# Patient Record
Sex: Male | Born: 1935
Health system: Southern US, Community
[De-identification: ages and names within clinical notes are randomized; demographics above are authoritative.]

## PROBLEM LIST (undated history)

## (undated) DIAGNOSIS — T7840XA Allergy, unspecified, initial encounter: Secondary | ICD-10-CM

## (undated) DIAGNOSIS — C61 Malignant neoplasm of prostate: Secondary | ICD-10-CM

## (undated) DIAGNOSIS — I10 Essential (primary) hypertension: Secondary | ICD-10-CM

## (undated) DIAGNOSIS — M1990 Unspecified osteoarthritis, unspecified site: Secondary | ICD-10-CM

## (undated) DIAGNOSIS — D649 Anemia, unspecified: Secondary | ICD-10-CM

## (undated) DIAGNOSIS — J189 Pneumonia, unspecified organism: Secondary | ICD-10-CM

## (undated) DIAGNOSIS — M659 Unspecified synovitis and tenosynovitis, unspecified site: Secondary | ICD-10-CM

## (undated) DIAGNOSIS — I509 Heart failure, unspecified: Secondary | ICD-10-CM

## (undated) DIAGNOSIS — Z9289 Personal history of other medical treatment: Secondary | ICD-10-CM

## (undated) DIAGNOSIS — I2510 Atherosclerotic heart disease of native coronary artery without angina pectoris: Secondary | ICD-10-CM

## (undated) DIAGNOSIS — G629 Polyneuropathy, unspecified: Secondary | ICD-10-CM

## (undated) DIAGNOSIS — I639 Cerebral infarction, unspecified: Secondary | ICD-10-CM

## (undated) DIAGNOSIS — K469 Unspecified abdominal hernia without obstruction or gangrene: Secondary | ICD-10-CM

## (undated) DIAGNOSIS — H269 Unspecified cataract: Secondary | ICD-10-CM

## (undated) DIAGNOSIS — I255 Ischemic cardiomyopathy: Secondary | ICD-10-CM

## (undated) DIAGNOSIS — Z87442 Personal history of urinary calculi: Secondary | ICD-10-CM

## (undated) DIAGNOSIS — L409 Psoriasis, unspecified: Secondary | ICD-10-CM

## (undated) DIAGNOSIS — F419 Anxiety disorder, unspecified: Secondary | ICD-10-CM

## (undated) DIAGNOSIS — I219 Acute myocardial infarction, unspecified: Secondary | ICD-10-CM

## (undated) DIAGNOSIS — Z8719 Personal history of other diseases of the digestive system: Secondary | ICD-10-CM

## (undated) DIAGNOSIS — G4730 Sleep apnea, unspecified: Secondary | ICD-10-CM

## (undated) DIAGNOSIS — D126 Benign neoplasm of colon, unspecified: Secondary | ICD-10-CM

## (undated) HISTORY — PX: WRIST SURGERY: SHX841

## (undated) HISTORY — DX: Synovitis and tenosynovitis, unspecified: M65.9

## (undated) HISTORY — DX: Personal history of other diseases of the digestive system: Z87.19

## (undated) HISTORY — DX: Unspecified osteoarthritis, unspecified site: M19.90

## (undated) HISTORY — DX: Benign neoplasm of colon, unspecified: D12.6

## (undated) HISTORY — DX: Malignant neoplasm of prostate: C61

## (undated) HISTORY — DX: Personal history of urinary calculi: Z87.442

## (undated) HISTORY — DX: Cerebral infarction, unspecified: I63.9

## (undated) HISTORY — PX: POLYPECTOMY: SHX149

## (undated) HISTORY — DX: Unspecified synovitis and tenosynovitis, unspecified site: M65.90

## (undated) HISTORY — PX: COLONOSCOPY: SHX174

## (undated) HISTORY — DX: Psoriasis, unspecified: L40.9

## (undated) HISTORY — DX: Unspecified abdominal hernia without obstruction or gangrene: K46.9

## (undated) HISTORY — PX: EYE SURGERY: SHX253

## (undated) HISTORY — DX: Allergy, unspecified, initial encounter: T78.40XA

## (undated) HISTORY — DX: Unspecified cataract: H26.9

## (undated) HISTORY — DX: Polyneuropathy, unspecified: G62.9

## (undated) HISTORY — DX: Acute myocardial infarction, unspecified: I21.9

---
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
## 1978-03-10 HISTORY — PX: BACK SURGERY: SHX140

## 1978-11-09 HISTORY — PX: COLON SURGERY: SHX602

## 1983-03-11 DIAGNOSIS — Z9289 Personal history of other medical treatment: Secondary | ICD-10-CM

## 1983-03-11 HISTORY — DX: Personal history of other medical treatment: Z92.89

## 1983-03-11 HISTORY — PX: COLOSTOMY: SHX63

## 1983-03-11 HISTORY — PX: APPENDECTOMY: SHX54

## 1983-11-09 HISTORY — PX: COLON SURGERY: SHX602

## 1984-03-10 HISTORY — PX: CHOLECYSTECTOMY: SHX55

## 1984-10-08 ENCOUNTER — Encounter: Payer: Self-pay | Admitting: Gastroenterology

## 1984-10-28 ENCOUNTER — Encounter: Payer: Self-pay | Admitting: Gastroenterology

## 1985-03-10 HISTORY — PX: OTHER SURGICAL HISTORY: SHX169

## 1993-08-02 ENCOUNTER — Encounter: Payer: Self-pay | Admitting: Gastroenterology

## 1993-08-29 ENCOUNTER — Encounter: Payer: Self-pay | Admitting: Gastroenterology

## 1996-11-29 ENCOUNTER — Encounter: Payer: Self-pay | Admitting: Gastroenterology

## 2000-08-17 ENCOUNTER — Encounter: Payer: Self-pay | Admitting: Gastroenterology

## 2000-09-02 ENCOUNTER — Encounter: Payer: Self-pay | Admitting: Gastroenterology

## 2001-03-10 HISTORY — PX: KNEE ARTHROSCOPY: SUR90

## 2003-11-28 ENCOUNTER — Ambulatory Visit (HOSPITAL_COMMUNITY): Admission: RE | Admit: 2003-11-28 | Discharge: 2003-11-28 | Payer: Self-pay | Admitting: Internal Medicine

## 2004-01-29 ENCOUNTER — Ambulatory Visit: Payer: Self-pay | Admitting: Internal Medicine

## 2004-02-13 ENCOUNTER — Ambulatory Visit (HOSPITAL_COMMUNITY): Admission: RE | Admit: 2004-02-13 | Discharge: 2004-02-13 | Payer: Self-pay | Admitting: Orthopedic Surgery

## 2004-07-04 ENCOUNTER — Ambulatory Visit: Payer: Self-pay | Admitting: Internal Medicine

## 2004-08-12 ENCOUNTER — Ambulatory Visit: Payer: Self-pay | Admitting: Internal Medicine

## 2004-11-21 ENCOUNTER — Ambulatory Visit: Payer: Self-pay | Admitting: Internal Medicine

## 2004-11-26 ENCOUNTER — Ambulatory Visit: Payer: Self-pay | Admitting: Internal Medicine

## 2005-01-09 ENCOUNTER — Ambulatory Visit: Payer: Self-pay | Admitting: Internal Medicine

## 2005-05-26 ENCOUNTER — Ambulatory Visit: Payer: Self-pay | Admitting: Internal Medicine

## 2005-08-01 ENCOUNTER — Ambulatory Visit: Payer: Self-pay | Admitting: Internal Medicine

## 2005-08-14 ENCOUNTER — Ambulatory Visit: Payer: Self-pay | Admitting: Gastroenterology

## 2005-08-14 ENCOUNTER — Ambulatory Visit: Payer: Self-pay | Admitting: Internal Medicine

## 2005-08-26 ENCOUNTER — Ambulatory Visit: Payer: Self-pay | Admitting: Gastroenterology

## 2005-08-26 ENCOUNTER — Encounter (INDEPENDENT_AMBULATORY_CARE_PROVIDER_SITE_OTHER): Payer: Self-pay | Admitting: *Deleted

## 2006-01-09 ENCOUNTER — Ambulatory Visit: Payer: Self-pay | Admitting: Internal Medicine

## 2006-04-16 ENCOUNTER — Ambulatory Visit: Payer: Self-pay | Admitting: Internal Medicine

## 2006-11-27 ENCOUNTER — Encounter: Payer: Self-pay | Admitting: *Deleted

## 2006-11-27 DIAGNOSIS — J020 Streptococcal pharyngitis: Secondary | ICD-10-CM | POA: Insufficient documentation

## 2006-11-27 DIAGNOSIS — M659 Unspecified synovitis and tenosynovitis, unspecified site: Secondary | ICD-10-CM | POA: Insufficient documentation

## 2006-11-27 DIAGNOSIS — Z8719 Personal history of other diseases of the digestive system: Secondary | ICD-10-CM

## 2006-11-27 DIAGNOSIS — Z87442 Personal history of urinary calculi: Secondary | ICD-10-CM

## 2006-11-27 DIAGNOSIS — Z9189 Other specified personal risk factors, not elsewhere classified: Secondary | ICD-10-CM | POA: Insufficient documentation

## 2006-11-27 DIAGNOSIS — I10 Essential (primary) hypertension: Secondary | ICD-10-CM

## 2006-11-27 DIAGNOSIS — M1990 Unspecified osteoarthritis, unspecified site: Secondary | ICD-10-CM | POA: Insufficient documentation

## 2006-11-27 DIAGNOSIS — Z9889 Other specified postprocedural states: Secondary | ICD-10-CM | POA: Insufficient documentation

## 2006-11-27 DIAGNOSIS — Z9089 Acquired absence of other organs: Secondary | ICD-10-CM | POA: Insufficient documentation

## 2006-11-27 HISTORY — DX: Personal history of other diseases of the digestive system: Z87.19

## 2006-11-27 HISTORY — DX: Personal history of urinary calculi: Z87.442

## 2007-01-08 ENCOUNTER — Ambulatory Visit: Payer: Self-pay | Admitting: Internal Medicine

## 2007-03-15 ENCOUNTER — Ambulatory Visit: Payer: Self-pay | Admitting: Internal Medicine

## 2007-03-15 LAB — CONVERTED CEMR LAB
BUN: 20 mg/dL (ref 6–23)
CO2: 31 meq/L (ref 19–32)
Calcium: 10 mg/dL (ref 8.4–10.5)
Chloride: 101 meq/L (ref 96–112)
Creatinine, Ser: 1 mg/dL (ref 0.4–1.5)
Glucose, Bld: 88 mg/dL (ref 70–99)

## 2007-03-16 ENCOUNTER — Encounter: Payer: Self-pay | Admitting: Internal Medicine

## 2007-07-02 ENCOUNTER — Encounter: Payer: Self-pay | Admitting: Internal Medicine

## 2007-07-29 ENCOUNTER — Encounter: Payer: Self-pay | Admitting: Internal Medicine

## 2007-09-01 ENCOUNTER — Ambulatory Visit: Admission: RE | Admit: 2007-09-01 | Discharge: 2007-11-30 | Payer: Self-pay | Admitting: Radiation Oncology

## 2007-09-02 ENCOUNTER — Encounter: Payer: Self-pay | Admitting: Internal Medicine

## 2007-10-15 ENCOUNTER — Ambulatory Visit: Payer: Self-pay | Admitting: Internal Medicine

## 2007-10-15 DIAGNOSIS — K432 Incisional hernia without obstruction or gangrene: Secondary | ICD-10-CM | POA: Insufficient documentation

## 2007-10-15 DIAGNOSIS — L408 Other psoriasis: Secondary | ICD-10-CM | POA: Insufficient documentation

## 2007-10-16 LAB — CONVERTED CEMR LAB
Basophils Absolute: 0 10*3/uL (ref 0.0–0.1)
Calcium: 9.9 mg/dL (ref 8.4–10.5)
Creatinine, Ser: 0.9 mg/dL (ref 0.4–1.5)
Eosinophils Relative: 2.3 % (ref 0.0–5.0)
GFR calc Af Amer: 107 mL/min
GFR calc non Af Amer: 88 mL/min
Glucose, Bld: 86 mg/dL (ref 70–99)
HCT: 44.5 % (ref 39.0–52.0)
MCHC: 35 g/dL (ref 30.0–36.0)
Monocytes Absolute: 0.7 10*3/uL (ref 0.1–1.0)
Monocytes Relative: 8.5 % (ref 3.0–12.0)
Potassium: 3.8 meq/L (ref 3.5–5.1)
Sodium: 141 meq/L (ref 135–145)

## 2007-10-18 ENCOUNTER — Ambulatory Visit: Payer: Self-pay | Admitting: Cardiology

## 2007-10-18 DIAGNOSIS — G609 Hereditary and idiopathic neuropathy, unspecified: Secondary | ICD-10-CM | POA: Insufficient documentation

## 2007-10-19 ENCOUNTER — Encounter (INDEPENDENT_AMBULATORY_CARE_PROVIDER_SITE_OTHER): Payer: Self-pay | Admitting: *Deleted

## 2007-10-26 ENCOUNTER — Encounter: Admission: RE | Admit: 2007-10-26 | Discharge: 2007-10-26 | Payer: Self-pay | Admitting: Urology

## 2007-11-12 ENCOUNTER — Encounter: Payer: Self-pay | Admitting: Internal Medicine

## 2007-11-26 ENCOUNTER — Ambulatory Visit (HOSPITAL_BASED_OUTPATIENT_CLINIC_OR_DEPARTMENT_OTHER): Admission: RE | Admit: 2007-11-26 | Discharge: 2007-11-26 | Payer: Self-pay | Admitting: Urology

## 2007-12-23 ENCOUNTER — Ambulatory Visit: Admission: RE | Admit: 2007-12-23 | Discharge: 2008-02-28 | Payer: Self-pay | Admitting: Radiation Oncology

## 2008-03-22 ENCOUNTER — Encounter: Payer: Self-pay | Admitting: Internal Medicine

## 2008-10-04 ENCOUNTER — Encounter: Payer: Self-pay | Admitting: Internal Medicine

## 2008-12-13 ENCOUNTER — Ambulatory Visit: Payer: Self-pay | Admitting: Internal Medicine

## 2008-12-14 ENCOUNTER — Ambulatory Visit: Payer: Self-pay | Admitting: Internal Medicine

## 2008-12-14 DIAGNOSIS — Z8546 Personal history of malignant neoplasm of prostate: Secondary | ICD-10-CM

## 2008-12-14 DIAGNOSIS — Z860100 Personal history of colon polyps, unspecified: Secondary | ICD-10-CM | POA: Insufficient documentation

## 2008-12-14 LAB — CONVERTED CEMR LAB
Calcium: 10.1 mg/dL (ref 8.4–10.5)
Chloride: 104 meq/L (ref 96–112)
Creatinine, Ser: 0.9 mg/dL (ref 0.4–1.5)
Potassium: 3.7 meq/L (ref 3.5–5.1)
Sodium: 136 meq/L (ref 135–145)
Total CHOL/HDL Ratio: 4
Triglycerides: 79 mg/dL (ref 0.0–149.0)

## 2008-12-19 ENCOUNTER — Ambulatory Visit: Payer: Self-pay | Admitting: Internal Medicine

## 2008-12-24 ENCOUNTER — Encounter: Payer: Self-pay | Admitting: Internal Medicine

## 2009-02-12 ENCOUNTER — Ambulatory Visit: Payer: Self-pay | Admitting: Internal Medicine

## 2009-02-12 LAB — CONVERTED CEMR LAB
Leukocytes, UA: NEGATIVE
Specific Gravity, Urine: 1.025 (ref 1.000–1.030)
Urobilinogen, UA: 0.2 (ref 0.0–1.0)
pH: 5.5 (ref 5.0–8.0)

## 2009-02-13 ENCOUNTER — Telehealth (INDEPENDENT_AMBULATORY_CARE_PROVIDER_SITE_OTHER): Payer: Self-pay | Admitting: *Deleted

## 2009-03-12 ENCOUNTER — Encounter: Payer: Self-pay | Admitting: Internal Medicine

## 2009-03-19 ENCOUNTER — Telehealth: Payer: Self-pay | Admitting: Gastroenterology

## 2009-03-21 ENCOUNTER — Ambulatory Visit: Payer: Self-pay | Admitting: Gastroenterology

## 2009-03-21 ENCOUNTER — Encounter (INDEPENDENT_AMBULATORY_CARE_PROVIDER_SITE_OTHER): Payer: Self-pay | Admitting: *Deleted

## 2009-03-21 DIAGNOSIS — R197 Diarrhea, unspecified: Secondary | ICD-10-CM

## 2009-03-21 DIAGNOSIS — K921 Melena: Secondary | ICD-10-CM | POA: Insufficient documentation

## 2009-04-04 ENCOUNTER — Ambulatory Visit: Payer: Self-pay | Admitting: Gastroenterology

## 2009-04-04 ENCOUNTER — Ambulatory Visit (HOSPITAL_COMMUNITY): Admission: RE | Admit: 2009-04-04 | Discharge: 2009-04-04 | Payer: Self-pay | Admitting: Gastroenterology

## 2009-04-06 ENCOUNTER — Encounter: Payer: Self-pay | Admitting: Gastroenterology

## 2009-06-18 ENCOUNTER — Encounter: Payer: Self-pay | Admitting: Gastroenterology

## 2009-10-01 ENCOUNTER — Ambulatory Visit: Payer: Self-pay | Admitting: Internal Medicine

## 2009-10-29 ENCOUNTER — Ambulatory Visit: Payer: Self-pay | Admitting: Internal Medicine

## 2009-11-02 ENCOUNTER — Encounter: Payer: Self-pay | Admitting: Internal Medicine

## 2009-11-02 LAB — CONVERTED CEMR LAB: Metaneph Total, Ur: 374 ug/24hr (ref 224–832)

## 2009-11-12 ENCOUNTER — Encounter: Payer: Self-pay | Admitting: Internal Medicine

## 2009-11-13 ENCOUNTER — Telehealth: Payer: Self-pay | Admitting: Internal Medicine

## 2009-11-22 ENCOUNTER — Ambulatory Visit: Payer: Self-pay | Admitting: Pulmonary Disease

## 2009-12-27 ENCOUNTER — Ambulatory Visit: Payer: Self-pay | Admitting: Internal Medicine

## 2009-12-27 ENCOUNTER — Encounter: Payer: Self-pay | Admitting: Internal Medicine

## 2009-12-27 LAB — CONVERTED CEMR LAB
ALT: 11 units/L (ref 0–53)
AST: 13 units/L (ref 0–37)
Albumin: 3.5 g/dL (ref 3.5–5.2)
Basophils Absolute: 0 10*3/uL (ref 0.0–0.1)
Basophils Relative: 0.5 % (ref 0.0–3.0)
Calcium: 10 mg/dL (ref 8.4–10.5)
Chloride: 103 meq/L (ref 96–112)
Cholesterol: 131 mg/dL (ref 0–200)
Creatinine, Ser: 1 mg/dL (ref 0.4–1.5)
Eosinophils Relative: 2.2 % (ref 0.0–5.0)
Glucose, Bld: 86 mg/dL (ref 70–99)
HCT: 43.9 % (ref 39.0–52.0)
LDL Cholesterol: 83 mg/dL (ref 0–99)
MCV: 96.8 fL (ref 78.0–100.0)
Monocytes Absolute: 0.7 10*3/uL (ref 0.1–1.0)
Monocytes Relative: 8.4 % (ref 3.0–12.0)
Neutro Abs: 6.4 10*3/uL (ref 1.4–7.7)
Platelets: 243 10*3/uL (ref 150.0–400.0)
RBC: 4.54 M/uL (ref 4.22–5.81)
RDW: 15 % — ABNORMAL HIGH (ref 11.5–14.6)
Sodium: 142 meq/L (ref 135–145)
TSH: 2.44 microintl units/mL (ref 0.35–5.50)
Total Protein: 6.4 g/dL (ref 6.0–8.3)
Triglycerides: 72 mg/dL (ref 0.0–149.0)
VLDL: 14.4 mg/dL (ref 0.0–40.0)

## 2009-12-30 ENCOUNTER — Telehealth: Payer: Self-pay | Admitting: Internal Medicine

## 2010-01-02 ENCOUNTER — Ambulatory Visit (HOSPITAL_BASED_OUTPATIENT_CLINIC_OR_DEPARTMENT_OTHER): Admission: RE | Admit: 2010-01-02 | Discharge: 2010-01-02 | Payer: Self-pay | Admitting: Pulmonary Disease

## 2010-01-08 ENCOUNTER — Ambulatory Visit: Payer: Self-pay | Admitting: Pulmonary Disease

## 2010-01-09 ENCOUNTER — Telehealth: Payer: Self-pay | Admitting: Pulmonary Disease

## 2010-01-16 ENCOUNTER — Ambulatory Visit: Payer: Self-pay | Admitting: Pulmonary Disease

## 2010-01-23 ENCOUNTER — Telehealth: Payer: Self-pay | Admitting: Gastroenterology

## 2010-01-24 ENCOUNTER — Ambulatory Visit: Payer: Self-pay | Admitting: Gastroenterology

## 2010-01-24 DIAGNOSIS — R109 Unspecified abdominal pain: Secondary | ICD-10-CM

## 2010-01-24 DIAGNOSIS — R1084 Generalized abdominal pain: Secondary | ICD-10-CM

## 2010-01-24 DIAGNOSIS — R198 Other specified symptoms and signs involving the digestive system and abdomen: Secondary | ICD-10-CM

## 2010-01-25 ENCOUNTER — Telehealth: Payer: Self-pay | Admitting: Nurse Practitioner

## 2010-02-19 ENCOUNTER — Ambulatory Visit: Payer: Self-pay | Admitting: Gastroenterology

## 2010-02-19 DIAGNOSIS — K5900 Constipation, unspecified: Secondary | ICD-10-CM | POA: Insufficient documentation

## 2010-03-21 ENCOUNTER — Telehealth: Payer: Self-pay | Admitting: Internal Medicine

## 2010-03-27 ENCOUNTER — Encounter: Payer: Self-pay | Admitting: Gastroenterology

## 2010-04-09 NOTE — Procedures (Signed)
Summary: [employer]   Imported By: Sherian Rein 03/22/2009 10:59:16  _____________________________________________________________________  External Attachment:    Type:   Image     Comment:   External Document

## 2010-04-09 NOTE — Procedures (Signed)
Summary: Colonoscopy  Patient: [name] Note: All result statuses are Final unless otherwise noted.  Tests: (1) Colonoscopy (COL)   COL Colonoscopy           DONE     [facility]     [address] [place], [place]  [address]           COLONOSCOPY PROCEDURE REPORT           PATIENT:  Jose Ware, Jose Ware  MR#:  [id number]     BIRTHDATE:  [birth year], 74 yrs. old  GENDER:  male           ENDOSCOPIST:  Judie Petit T. Russella Dar, MD, [facility]           PROCEDURE DATE:  04/04/2009     PROCEDURE:  Colonoscopy with biopsy and snare polypectomy,  w/ inj     sclerosis of hemorrhoids, w/ control of bleeding     ASA CLASS:  Class II     INDICATIONS:  1) hematochezia  2) unexplained diarrhea           MEDICATIONS:   Fentanyl 100 mcg IV, Versed 6 mg IV           DESCRIPTION OF PROCEDURE:   After the risks benefits and     alternatives of the procedure were thoroughly explained, informed     consent was obtained.  Digital rectal exam was performed and     revealed no abnormalities.   The Pentax Colonoscope [id number]     endoscope was introduced through the anus and advanced to the     cecum, which was identified by both the appendix and ileocecal     valve, without limitations.  The quality of the prep was good,     using MoviPrep.  The instrument was then slowly withdrawn as the     colon was fully examined.     <<PROCEDUREIMAGES>>           FINDINGS:  A sessile polyp was found at the ileocecal valve. It     was 3 mm in size. The polyp was removed using cold biopsy forceps.     A sessile polyp was found in the sigmoid colon. It was 4 mm in     size. The polyp was removed using cold biopsy forceps.  A sessile     polyp was found in the rectum on retroflexed view. It was 6 mm in     size. Polyp was snared, then cauterized with monopolar cautery.     Retrieval was successful. Mild diverticulosis was found in the     sigmoid colon.  Radiation proctitis was identified. in the rectum.   It was focal, friable, red and scattered. This was ablated with     APC with good results.  Internal hemorrhoids were found. They were     moderately sized. Injection sclerosis of hemorrhoids performed     with 2 cc of 23.4% saline injected above the dentate line. This     was otherwise a normal examination of the colon. Retroflexed views     in the rectum revealed no other findings other than those already     described.  The time to cecum =  3  minutes. The scope was then     withdrawn (time =  19  min) from the patient and the procedure     completed.           COMPLICATIONS:  None           ENDOSCOPIC IMPRESSION:     1) 3 mm sessile polyp at the ileocecal valve     2) 4 mm sessile polyp in the sigmoid colon     3) 6 mm sessile polyp     4) Mild diverticulosis in the sigmoid colon     5) Radiation proctitis     6) Internal hemorrhoids           RECOMMENDATIONS:     1) No aspirin or NSAID's for 2 weeks     2) Repeat Colonoscopy in 5 years.           Venita Lick. Russella Dar, MD, Clementeen Graham           CC: Su Grand, MD, Jacques Navy, MD           n.     Rosalie DoctorVenita Lick. Fajr Fife at 04/04/2009 09:28 AM           Burnetta Sabin, [id number]  Note: An exclamation mark (!) indicates a result that was not dispersed into the flowsheet. Document Creation Date: 04/04/2009 9:29 AM _______________________________________________________________________  (1) Order result status: Final Collection or observation date-time: 04/04/2009 09:17 Requested date-time:  Receipt date-time:  Reported date-time:  Referring Physician:   Ordering Physician: Claudette Head [phone]) Specimen Source:  Source: Launa Grill Order Number: [id number] Lab site:

## 2010-04-09 NOTE — Progress Notes (Signed)
Summary: RESULTS  Phone Note Call from Patient Call back at Home Phone [phone]   Summary of Call: Patient is requesting results of labs. Letter was mailed - wil call patient with results.  Initial call taken by: Lamar Sprinkles, CMA,  November 13, 2009 3:13 PM  Follow-up for Phone Call        Pt recieved letter. He wants to know what to do now. I spoke w/pt's wife who is a retired [employer] fundraiser. She has confirmed w/manual BP reading that the pt's machine is accurate.  9/4 182/96 160/86 135/74 9/5 180/99 180/94 9/6 150/82 160/96 169/94 9/7 158/87 154/84  Pt's wife had info on sleep apnea and pt feels he may have some symptoms. Pt c/o frequently being up and down at night, heavy snoring & daytime sleepyness. He has never had a sleep study.  Follow-up by: Lamar Sprinkles, CMA,  November 14, 2009 6:30 PM  Additional Follow-up for Phone Call Additional follow up Details #1::        1. change from hctz to furosemide 40mg  once a day 2. increase diltiazem to 240mg  once a day. 3. Will have [facility] schedule sleep study. Additional Follow-up by: Jacques Navy MD,  November 15, 2009 11:10 AM  New Problems: HYPERSOMNIA (ICD-780.54)   Additional Follow-up for Phone Call Additional follow up Details #2::    left mess to call office back.................Marland KitchenLamar Sprinkles, CMA  November 15, 2009 4:17 PM    Pt informed  Follow-up by: Lamar Sprinkles, CMA,  November 15, 2009 6:24 PM  New Problems: HYPERSOMNIA (ICD-780.54) New/Updated Medications: FUROSEMIDE 40 MG TABS (FUROSEMIDE) 1 by mouth once daily DILTIAZEM HCL COATED BEADS 240 MG XR24H-CAP (DILTIAZEM HCL COATED BEADS) 1 by mouth once daily Prescriptions: FUROSEMIDE 40 MG TABS (FUROSEMIDE) 1 by mouth once daily  #90 x 1   Entered by:   Lamar Sprinkles, CMA   Authorized by:   Jacques Navy MD   Signed by:   Lamar Sprinkles, CMA on 11/15/2009   Method used:   Electronically to        [facility]  [phone]* (retail)  [address]       [place], [place]  [address]       Ph: [phone] or [phone]       Fax: [phone]   RxID:   [id number] DILTIAZEM HCL COATED BEADS 240 MG XR24H-CAP (DILTIAZEM HCL COATED BEADS) 1 by mouth once daily  #90 x 1   Entered by:   Lamar Sprinkles, CMA   Authorized by:   Jacques Navy MD   Signed by:   Lamar Sprinkles, CMA on 11/15/2009   Method used:   Electronically to        [facility]  [phone]* (retail)       [address]       [place], [place]  [address]       Ph: [phone] or [phone]       Fax: [phone]   RxID:   [id number]

## 2010-04-09 NOTE — Assessment & Plan Note (Signed)
Summary: abdominal pain/change in bowel habits/sheri    History of Present Illness Visit Type: Follow-up Visit Primary GI MD: Elie Goody MD [facility] Primary Provider: Illene Regulus, MD Requesting Provider: n/a Chief Complaint: Patient c/o several days constant lower abdominal "soreness" and alternating constipation and diarrhea. There has been no blood in the stool. History of Present Illness:   Patient followed by Dr. Russella Dar for history of colon polyps and radiation proctitis. In 1985 he had sogmoid resection with colostomy for diverticulitis. Patient presents with new onset lower abdominal discomfort. Patient chronically alternates between constipation / loose stools, pattern has become more erratic lately. Two nights ago he had a low grade fever.   In Jan. 2011 patient had a colonoscopy for diarrhea and hematochezia. Dr. Russella Dar cauterized some areas of radiation proctitis at the time. Patient's current bowel pattern similliar to before though he isn't having any rectal bleeding.     GI Review of Systems    Reports abdominal pain, belching, loss of appetite, and  nausea.     Location of  Abdominal pain: lower abdomen.    Denies acid reflux, bloating, chest pain, dysphagia with liquids, dysphagia with solids, heartburn, vomiting, vomiting blood, weight loss, and  weight gain.      Reports change in bowel habits, constipation, diarrhea, and  diverticulosis.     Denies anal fissure, black tarry stools, fecal incontinence, heme positive stool, hemorrhoids, irritable bowel syndrome, jaundice, light color stool, liver problems, rectal bleeding, and  rectal pain.    Current Medications (verified): 1)  Furosemide 40 Mg Tabs (Furosemide) .Marland Kitchen.. 1 By Mouth Once Daily 2)  Diltiazem Hcl Coated Beads 240 Mg Xr24h-Cap (Diltiazem Hcl Coated Beads) .Marland Kitchen.. 1 By Mouth Once Daily 3)  Aspirin 325 Mg  Tabs (Aspirin) .... Take 1 Tablet By Mouth Once A Day 4)  Cyanocobalamin 1000 Mcg/ml Soln (Cyanocobalamin)  .Marland Kitchen.. 1000 Micrograms Intramuscularly Once A Month 5)  3 Ml Syringe 25 Guage 1/2 Needle .... Use With B-12 Monthy Injections 6)  Tylenol 325 Mg Tabs (Acetaminophen) .... Take As Needed  Allergies: 1)  ! Betadine  Past History:  Past Medical History: Reviewed history from 12/27/2009 and no changes required. Hx of ADENOCARCINOMA, PROSTATE (ICD-185) XRT and radiation seed implantation in 2010 PERIPHERAL NEUROPATHY (ICD-356.9) PSORIASIS (ICD-696.1) INCI HERNIA WITHOUT MENTION OBSTRUCTION/GANGRENE (ICD-553.21) DEGENERATIVE JOINT DISEASE (ICD-715.90) HYPERTENSION (ICD-401.9) Hx of STREPTOCOCCAL PHARYNGITIS (ICD-034.0) Hx of TENOSYNOVITIS (ICD-727.00) NEPHROLITHIASIS, HX OF (ICD-V13.01) DIVERTICULITIS, HX OF (ICD-V12.79) Adenomatous Colon Polyps 08/1993  Physician Roster:            Vaughan Sine Homero Fellers [place]            GU- Vernia Buff Nesi            GI - Claudette Head  Past Surgical History: * BACK SURGERY-minor disk surgery; instrumentation placed and removed in a second procedure CHOLECYSTECTOMY, HX OF (ICD-V45.79) '86 * REVERSAL OF COLOSTOMY '87 * SURGICAL REPAIR LEFT WRIST remote complicated with infection Hx of COLOSTOMY (ICD-V44.3) '85 after colectomy for diverticulitis ARTHROSCOPY, KNEE, HX OF (ICD-V45.89) left knee  '03 APPENDECTOMY, HX OF (ICD-V45.79)-remote '85 TONSILLECTOMY, HX OF (ICD-V45.79) as child Right knee surgery with cartilage removed '56 Bilateral Cataract Extractions  Family History: father- died 70, cirrhosis- alcohol mother - died 11, CAD, Liver trouble (?) sister - died 44, CHF, Breast cancer with arm involvement leading to amputation right arm. brother - deceased CAD/MI brother - died unknown cause Neg- colon, prostate cancer; DM and  glaucoma No FH of Colon Cancer: Family  History of Clotting disorder: 1/2 brother?  Social History: Reviewed history from 12/27/2009 and no changes required. [facility], [place].  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  The patient denies allergy/sinus, anemia, anxiety-new, back pain, blood in urine, breast changes/lumps, change in vision, confusion, cough, coughing up blood, depression-new, fainting, fever, headaches-new, hearing problems, heart murmur, heart rhythm changes, itching, menstrual pain, muscle pains/cramps, night sweats, nosebleeds, pregnancy symptoms, shortness of breath, skin rash, sore throat, swelling of feet/legs, swollen lymph glands, thirst - excessive , urination - excessive , urination changes/pain, urine leakage, vision changes, and voice change.    Vital Signs:  Patient profile:   76 year old male Height:      73 inches Weight:      228.13 pounds BMI:     30.21 BSA:     2.28 Pulse rate:   60 / minute Pulse rhythm:   regular BP sitting:   140 / 82  (left arm)  Vitals Entered By: Lamona Curl CMA Duncan Dull) (January 24, 2010 8:51 AM)  Physical Exam  General:  Well developed, well nourished, no acute distress. Head:  Normocephalic and atraumatic. Eyes:  Conjunctiva pink, no icterus.  Mouth:  No deformity or lesions, dentition normal. Neck:  no obvious masses  Lungs:  Clear throughout to auscultation. Heart:  RRR. Abdomen:  Abdomen soft, nondistended. He is significantly tender in LLQ. No peritoneal signs. No obvious masses or hepatomegaly.Normal bowel sounds.  Msk:  Symmetrical with no gross deformities. Normal posture. Extremities:  No palmar erythema, no edema.  Neurologic:  Alert and  oriented x4;  grossly normal neurologically. Skin:  Intact without significant  lesions or rashes. Cervical Nodes:  No significant cervical adenopathy. Psych:  Alert and cooperative. Normal mood and affect.   Impression & Recommendations:  Problem # 1:  ABDOMINAL PAIN, LOWER (ICD-789.09) Assessment New LLQ pain with low grade fever, worsening of his chronic alternating constipation / diarrhea. Could be infectious but suspect diverticulitis. Remote history of sigmoid resection for diverticulitis. Mild sigmoid diverticulosis on colonscopy Jan. 2011. Will treat with Flagyl and Cipro, Bentyl for pain (he doesn't want a narcotic). He knows to call us ASAP for worsening pain, fevers >100.9. Follow up with Dr. Russella Dar. See patient instructions.   Problem # 2:  CHANGE IN BOWELS (ICD-787.99) Chronic alternating constipation / diarrhea, worse over last few days and probably secondary to #1.   Patient Instructions: 1)  We sent prescriptions  for Cipro and Flagyl to  [facility]. 2)  Stay on a clear liquid diet for 24 to 48 hours then advance to a low residue low fiber diet. Once you are feeling better you can go to a regular diet. 3)  We sent a prescription for Bentyl to your pharmacy. 4)  We made you an appointment with Dr. Russella Dar to follow up on 02-19-10 at 10:15 AM. 5)  Copy sent to : Illene Regulus, MD 6)  The medication list was reviewed and reconciled.  All changed / newly prescribed medications were explained.  A complete medication list was provided to the patient / caregiver.  Prescriptions: BENTYL 10 MG CAPS (DICYCLOMINE HCL) Take 1 tab twice daily for cramping and spasms  #20 x 1   Entered by:   Lowry Ram NCMA   Authorized by:   Willette Cluster NP   Signed by:   Lowry Ram NCMA on 01/24/2010   Method used:   Electronically to        [facility]  [phone]* (retail)       [address]       [place], [place]  [address]       Ph: [phone] or [phone]       Fax: [phone]   RxID:   [id number] METRONIDAZOLE  500 MG TABS (METRONIDAZOLE) Take 1 tab three times a day x 10 days  #30 x 0   Entered by:   Lowry Ram NCMA   Authorized by:   Willette Cluster NP   Signed by:   Lowry Ram NCMA on 01/24/2010   Method used:   Electronically to        [facility]  [phone]* (retail)       [address]       [place], [place]  [address]       Ph: [phone] or [phone]       Fax: [phone]   RxID:   [id number] CIPRO 500 MG TABS (CIPROFLOXACIN HCL) Take 1 tab twice daily x 10 days  #20 x 0   Entered by:   Lowry Ram NCMA   Authorized by:   Willette Cluster NP   Signed by:   Lowry Ram NCMA on 01/24/2010   Method used:   Electronically to        [facility]  [phone]* (retail)       [address]       [place], [place]  [address]       Ph: [phone] or [phone]       Fax: [phone]   RxID:   [id number]

## 2010-04-09 NOTE — Letter (Signed)
Summary: [facility] Instructions  [facility]  [address] [place], [place] [address]   Phone: [phone]  Fax: [phone]       Jose Ware    16-Mar-1934    MRN: [id number]        Procedure Day Dorna Bloom: Wednesday January 26th, 2011     Arrival Time: 7:30am     Procedure Time: 8:30am     Location of Procedure:                    _ x_  [facility] (4th Floor)                        PREPARATION FOR COLONOSCOPY WITH MOVIPREP   Starting 5 days prior to your procedure 03/30/09  do not eat nuts, seeds, popcorn, corn, beans, peas,  salads, or any raw vegetables.  Do not take any fiber supplements (e.g. Metamucil, Citrucel, and Benefiber).  THE DAY BEFORE YOUR PROCEDURE         DATE:  04/03/09   DAY:  Tuesday   1.  Drink clear liquids the entire day-NO SOLID FOOD  2.  Do not drink anything colored red or purple.  Avoid juices with pulp.  No orange juice.  3.  Drink at least 64 oz. (8 glasses) of fluid/clear liquids during the day to prevent dehydration and help the prep work efficiently.  CLEAR LIQUIDS INCLUDE: Water Jello Ice Popsicles Tea (sugar ok, no milk/cream) Powdered fruit flavored drinks Coffee (sugar ok, no milk/cream) Gatorade Juice: apple, white grape, white cranberry  Lemonade Clear bullion, consomm, broth Carbonated beverages (any kind) Strained chicken noodle soup Hard Candy                             4.  In the morning, mix first dose of MoviPrep solution:    Empty 1 Pouch A and 1 Pouch B into the disposable container    Add lukewarm drinking water to the top line of the container. Mix to dissolve    Refrigerate (mixed solution should be used within 24 hrs)  5.  Begin drinking the prep at 5:00 p.m. The MoviPrep container is divided by 4 marks.   Every 15 minutes drink the solution down to the next mark (approximately 8 oz) until the full liter is complete.   6.  Follow completed prep with 16 oz of clear liquid of  your choice (Nothing red or purple).  Continue to drink clear liquids until bedtime.  7.  Before going to bed, mix second dose of MoviPrep solution:    Empty 1 Pouch A and 1 Pouch B into the disposable container    Add lukewarm drinking water to the top line of the container. Mix to dissolve    Refrigerate  THE DAY OF YOUR PROCEDURE      DATE:   04/04/09 DAY:  Wednesday  Beginning at  3:30 a.m. (5 hours before procedure):         1. Every 15 minutes, drink the solution down to the next mark (approx 8 oz) until the full liter is complete.  2. Follow completed prep with 16 oz. of clear liquid of your choice.    3. You may drink clear liquids until  4:30am  (4 HOURS BEFORE PROCEDURE).   MEDICATION INSTRUCTIONS  Unless otherwise instructed, you should take regular prescription medications with a small sip of  water   as early as possible the morning of your procedure.           OTHER INSTRUCTIONS  You will need a responsible adult at least 74 years of age to accompany you and drive you home.   This person must remain in the waiting room during your procedure.  Wear loose fitting clothing that is easily removed.  Leave jewelry and other valuables at home.  However, you may wish to bring a book to read or  an iPod/MP3 player to listen to music as you wait for your procedure to start.  Remove all body piercing jewelry and leave at home.  Total time from sign-in until discharge is approximately 2-3 hours.  You should go home directly after your procedure and rest.  You can resume normal activities the  day after your procedure.  The day of your procedure you should not:   Drive   Make legal decisions   Operate machinery   Drink alcohol   Return to work  You will receive specific instructions about eating, activities and medications before you leave.    The above instructions have been reviewed and explained to me by   Marchelle Folks.     I fully understand and can  verbalize these instructions _____________________________ Date _________  Appended Document: Moviprep Instructions Changed location to [facility] outpatient registration.

## 2010-04-09 NOTE — Letter (Signed)
Summary: [facility]  [facility]   Imported By: Sherian Rein 03/22/2009 10:52:12  _____________________________________________________________________  External Attachment:    Type:   Image     Comment:   External Document

## 2010-04-09 NOTE — Assessment & Plan Note (Signed)
Summary: CPX-LB   Vital Signs:  Patient profile:   76 year old male Height:      73 inches Weight:      226 pounds BMI:     29.92 O2 Sat:      96 % on Room air Temp:     97.3 degrees F oral Pulse rate:   50 / minute BP sitting:   142 / 80  (left arm) Cuff size:   regular  Vitals Entered By: Bill Salinas CMA (December 27, 2009 9:34 AM)  O2 Flow:  Room air  Primary Care Provider:  Illene Regulus, MD    History of Present Illness: Patient presents for a wellness exam. He is generally doing well.  In the interval he has had cataract surgery with IOL in both eyes. He is doing  better with distant vision but needs corrective lenses for presbyopia.   He is scheduled for a sleep study next week to evaluate for OSA.  He remains 100% independent in his ADLs, He is balancing his own check book, paying the bills, follows the debates and keeping intellectually engaged.   Preventive Screening-Counseling & Management  Alcohol-Tobacco     Alcohol drinks/day: <1     Alcohol type: wine     >5/day in last 3 mos: yes     Smoking Status: quit     Year Quit: 1983  Caffeine-Diet-Exercise     Caffeine use/day: 1 cup of coffee daily     Diet Comments: heart healthy diet - low fat     Does Patient Exercise: yes     Type of exercise: weights     Exercise (avg: min/session): 30-60     Times/week: 4     MSH Depression Score: no depression  Hep-HIV-STD-Contraception     Hepatitis Risk: no risk noted     HIV Risk: no risk noted     STD Risk: no risk noted     Dental Visit-last 6 months yes     Sun Exposure-Excessive: no  Safety-Violence-Falls     Seat Belt Use: yes     Helmet Use: n/a     Firearms in the Home: no firearms in the home     Smoke Detectors: yes     Violence in the Home: no risk noted     Sexual Abuse: no     Fall Risk: Low fall risk      Sexual History:  currently monogamous.        Drug Use:  never.        Blood Transfusions:  yes and prior to 1987.    Current  Medications (verified): 1)  Furosemide 40 Mg Tabs (Furosemide) .Marland Kitchen.. 1 By Mouth Once Daily 2)  Diltiazem Hcl Coated Beads 240 Mg Xr24h-Cap (Diltiazem Hcl Coated Beads) .Marland Kitchen.. 1 By Mouth Once Daily 3)  Aspirin 325 Mg  Tabs (Aspirin) .... Take 1 Tablet By Mouth Once A Day  Allergies (verified): 1)  ! Betadine  Past History:  Past Medical History: Hx of ADENOCARCINOMA, PROSTATE (ICD-185) XRT and radiation seed implantation in 2010 PERIPHERAL NEUROPATHY (ICD-356.9) PSORIASIS (ICD-696.1) INCI HERNIA WITHOUT MENTION OBSTRUCTION/GANGRENE (ICD-553.21) DEGENERATIVE JOINT DISEASE (ICD-715.90) HYPERTENSION (ICD-401.9) Hx of STREPTOCOCCAL PHARYNGITIS (ICD-034.0) Hx of TENOSYNOVITIS (ICD-727.00) NEPHROLITHIASIS, HX OF (ICD-V13.01) DIVERTICULITIS, HX OF (ICD-V12.79) Adenomatous Colon Polyps 08/1993  Physician Roster:            Vaughan Sine Homero Fellers [place]            GU- Su Grand  GI - Claudette Head  Past Surgical History: Reviewed history from 03/15/2007 and no changes required. * BACK SURGERY-minor disk surgery; instrumentation placed and removed in a second procedure CHOLECYSTECTOMY, HX OF (ICD-V45.79) '86 * REVERSAL OF COLOSTOMY '87 * SURGICAL REPAIR LEFT WRIST remote complicated with infection Hx of COLOSTOMY (ICD-V44.3) '85 after colectomy for diverticulitis ARTHROSCOPY, KNEE, HX OF (ICD-V45.89) left knee  '03 APPENDECTOMY, HX OF (ICD-V45.79)-remote '85 TONSILLECTOMY, HX OF (ICD-V45.79) as child Right knee surgery with cartilage removed '56  Family History: father- died 58, cirrhosis- alcohol mother - died 29, CAD, Liver trouble (?) sister - died 9, CHF, Breast cancer with arm involvement leading to amputation right arm. brother - deceased CAD/MI brother - died unknown cause Neg- colon, prostate cancer; DM and  glaucoma  Social History: [employer], [place].  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; remarried '03 3 sons-'63,  '65, '67 grandchildren 30; 1 great-grand daughter Patient is a former smoker: quit 25 yrs ago  Alcohol Use - no Daily Caffeine Use: 3-4 cups of coffee daily  Illicit Drug Use - no Caffeine use/day:  1 cup of coffee daily Dental Care w/in 6 mos.:  yes Sun Exposure-Excessive:  no Seat Belt Use:  yes Fall Risk:  Low fall risk Hepatitis Risk:  no risk noted HIV Risk:  no risk noted STD Risk:  no risk noted Sexual History:  currently monogamous Drug Use:  never Blood Transfusions:  yes, prior to 1987  Review of Systems  The patient denies anorexia, fever, weight loss, weight gain, vision loss, decreased hearing, hoarseness, chest pain, syncope, dyspnea on exertion, peripheral edema, prolonged cough, headaches, hemoptysis, abdominal pain, severe indigestion/heartburn, hematuria, incontinence, suspicious skin lesions, transient blindness, difficulty walking, depression, abnormal bleeding, and enlarged lymph nodes.         right wrist pain - has loss of mobility  Physical Exam  General:  WNWD white male in no distress Head:  Normocephalic and atraumatic without obvious abnormalities. No apparent alopecia or balding. Eyes:  vision grossly intact, pupils equal, and pupils round. IOLs noticed. Funid normal Ears:  R ear normal and L ear normal.   Nose:  no external deformity and no external erythema.   Mouth:  Oral mucosa and oropharynx without lesions or exudates.  Teeth in good repair: partial upper and native teethmandible,. No oral lesions Neck:  supple, full ROM, no masses, no thyromegaly, and no carotid bruits.   Chest Wall:  No deformities, masses, tenderness or gynecomastia noted. Lungs:  Normal respiratory effort, chest expands symmetrically. Lungs are clear to auscultation, no crackles or wheezes. Heart:  Normal rate and regular rhythm. S1 and S2 normal without gallop, murmur, click, rub or other extra sounds. Abdomen:  soft, non-tender, normal bowel sounds, no guarding, no  rigidity, and no hepatomegaly.  Multiple surgical scars RUQ and Ventral Prostate:  deferred to GU Msk:  normal ROM, no joint tenderness, no joint swelling, no joint warmth, no joint deformities, and no joint instability.   Pulses:  2+ radial and PT pulses Extremities:  No clubbing, cyanosis, edema, or deformity noted with normal full range of motion of all joints.   Neurologic:  alert & oriented X3, cranial nerves II-XII intact, sensation intact to light touch, gait normal, and DTRs symmetrical and normal.  alert & oriented X3, cranial nerves II-XII intact, sensation intact to light touch, gait normal, and DTRs symmetrical and normal.   Skin:  turgor normal, color normal, no suspicious lesions, and no  ulcerations.  turgor normal, color normal, no suspicious lesions, and no ulcerations.   Cervical Nodes:  no anterior cervical adenopathy and no posterior cervical adenopathy.  no anterior cervical adenopathy and no posterior cervical adenopathy.   Psych:  Oriented X3.  Oriented X3.     Impression & Recommendations:  Problem # 1:  HYPERSOMNIA (ICD-780.54) Continues to be a problem but no adverse effects on ADLs.  Plan sleep study as planned with recommendations to follow.  Problem # 2:  PERIPHERAL NEUROPATHY (ICD-356.9) Mild peripheral neuropathy without progression.  Plan - r/o metabolic cause, e.g. B1 level. Orders: TLB-B12 + Folate Pnl ([id number]-B12/FOL)  addendum - B12 is low at 128.  Plan - offer B12 replacement  Problem # 3:  DEGENERATIVE JOINT DISEASE (ICD-715.90) stable with no interference with his chose activities. He is taking no prescription medications.  His updated medication list for this problem includes:    Aspirin 325 Mg Tabs (Aspirin) .Marland Kitchen... Take 1 tablet by mouth once a day  Problem # 4:  HYPERTENSION (ICD-401.9)  His updated medication list for this problem includes:    Furosemide 40 Mg Tabs (Furosemide) .Marland Kitchen... 1 by mouth once daily    Diltiazem Hcl Coated  Beads 240 Mg Xr24h-cap (Diltiazem hcl coated beads) .Marland Kitchen... 1 by mouth once daily  BP today: 142/80 Prior BP: 138/74 (11/22/2009)  Mildly elevated BP today with previous borderline control.  Plan - report back with home readings. If SBP in high 130s will need to adjust medications.  Problem # 5:  Preventive Health Care (ICD-V70.0) Recent history as noted and currently doing well. Physical exam witout acute abnormal findings. Labs are excellent with LDL 83, chemistries normal. Last colonoscoy Jan '11. Immunizations: tetnus - Jan '05, Pneumonia vaccine Dec '10, Influenza today. No record of shingles vaccine - he will check for insurance coveage.   As noted he is independent in ALDs and fully functional re: cognitive ability. He has no sign or symptoms of depression.  In summary - a very nice gentleman who is medically stable at this time. He will return for B12 treatment at his convenience. He will have a brief return office visit in three months to monitor BP.  orders: TLB-Lipid Panel (80061-LIPID) TLB-Hepatic/Liver Function Pnl (80076-HEPATIC) TLB-TSH (Thyroid Stimulating Hormone) (84443-TSH) TLB-CBC Platelet - w/Differential (85025-CBCD) TLB-BMP (Basic Metabolic Panel-BMET) (80048-METABOL)  Complete Medication List: 1)  Furosemide 40 Mg Tabs (Furosemide) .Marland Kitchen.. 1 by mouth once daily 2)  Diltiazem Hcl Coated Beads 240 Mg Xr24h-cap (Diltiazem hcl coated beads) .Marland Kitchen.. 1 by mouth once daily 3)  Aspirin 325 Mg Tabs (Aspirin) .... Take 1 tablet by mouth once a day  Other Orders: Flu Vaccine 31yrs + MEDICARE PATIENTS (Z6109) Administration Flu vaccine - MCR (U0454) Medicare -1st Annual Wellness Visit [phone])   tientBurnetta Sabin Note: All result statuses are Final unless otherwise noted.  Tests: (1) B12 + Folate Panel (B12/FOL)   Vitamin B12          [L]  128 pg/mL                   211-911   Folate                    10.2 ng/mL     Deficient  0.4 - 3.4 ng/mL     Indeterminate  3.4 -  5.4 ng/mL     Normal  >5.4 ng/mL  Tests: (2) Lipid Panel (LIPID)   Cholesterol  131 mg/dL                   5-621     ATP III Classification            Desirable:  < 200 mg/dL                    Borderline High:  200 - 239 mg/dL               High:  > = 240 mg/dL   Triglycerides             72.0 mg/dL                  3.0-865.7     Normal:  <150 mg/dL     Borderline High:  846 - 199 mg/dL   HDL                  [L]  96.29 mg/dL                 >52.84   VLDL Cholesterol          14.4 mg/dL                  1.3-24.4   LDL Cholesterol           83 mg/dL                    0-10  CHO/HDL Ratio:  CHD Risk                             4                    Men          Women     1/2 Average Risk     3.4          3.3     Average Risk          5.0          4.4     2X Average Risk          9.6          7.1     3X Average Risk          15.0          11.0                           Tests: (3) Hepatic/Liver Function Panel (HEPATIC)   Total Bilirubin           0.9 mg/dL                   2.7-2.5   Direct Bilirubin          0.1 mg/dL                   3.6-6.4   Alkaline Phosphatase      96 U/L                      39-117   AST                       13 U/L  0-37   ALT                       11 U/L                      0-53   Total Protein             6.4 g/dL                    9.1-4.7   Albumin                   3.5 g/dL                    8.2-9.5  Tests: (4) TSH (TSH)   FastTSH                   2.44 uIU/mL                 0.35-5.50  Tests: (5) CBC Platelet w/Diff (CBCD)   White Cell Count          8.3 K/uL                    4.5-10.5   Red Cell Count            4.54 Mil/uL                 4.22-5.81   Hemoglobin                15.2 g/dL                   62.1-30.8   Hematocrit                43.9 %                      39.0-52.0   MCV                       96.8 fl                     78.0-100.0   MCHC                      34.5 g/dL                    65.7-84.6   RDW                  [H]  15.0 %                      11.5-14.6   Platelet Count            243.0 K/uL                  150.0-400.0   Neutrophil %              76.6 %                      43.0-77.0   Lymphocyte %              12.3 %                      12.0-46.0   Monocyte %  8.4 %                       3.0-12.0   Eosinophils%              2.2 %                       0.0-5.0   Basophils %               0.5 %                       0.0-3.0   Neutrophill Absolute      6.4 K/uL                    1.4-7.7   Lymphocyte Absolute       1.0 K/uL                    0.7-4.0   Monocyte Absolute         0.7 K/uL                    0.1-1.0  Eosinophils, Absolute                             0.2 K/uL                    0.0-0.7   Basophils Absolute        0.0 K/uL                    0.0-0.1  Tests: (6) BMP (METABOL)   Sodium                    142 mEq/L                   135-145   Potassium                 4.1 mEq/L                   3.5-5.1   Chloride                  103 mEq/L                   96-112   Carbon Dioxide            32 mEq/L                    19-32   Glucose                   86 mg/dL                    16-10   BUN                  [H]  24 mg/dL                    9-60   Creatinine                1.0 mg/dL                   4.5-4.0   Calcium  10.0 mg/dL                  1.6-10.9   GFR                       78.63 mL/min                >60  Orders Added: 1)  Flu Vaccine 78yrs + MEDICARE PATIENTS [Q2039] 2)  Administration Flu vaccine - MCR [G0008] 3)  Medicare -1st Annual Wellness Visit [G0438] 4)  Est. Patient Level III [60454] 5)  TLB-B12 + Folate Pnl [[id number]-B12/FOL] 6)  TLB-Lipid Panel [80061-LIPID] 7)  TLB-Hepatic/Liver Function Pnl [80076-HEPATIC] 8)  TLB-TSH (Thyroid Stimulating Hormone) [84443-TSH] 9)  TLB-CBC Platelet - w/Differential [85025-CBCD] 10)  TLB-BMP (Basic Metabolic Panel-BMET) [80048-METABOL]    Flu Vaccine  Consent Questions     Do you have a history of severe allergic reactions to this vaccine? no    Any prior history of allergic reactions to egg and/or gelatin? no    Do you have a sensitivity to the preservative Thimersol? no    Do you have a past history of Guillan-Barre Syndrome? no    Do you currently have an acute febrile illness? no    Have you ever had a severe reaction to latex? no    Vaccine information given and explained to patient? yes    Are you currently pregnant? no    Lot Number:[id number]   Exp Date:09/07/2010   Site Given  right Deltoid [id number]

## 2010-04-09 NOTE — Procedures (Signed)
Summary: Colonoscopy/GCDD  Colonoscopy/GCDD   Imported By: Sherian Rein 03/22/2009 10:57:07  _____________________________________________________________________  External Attachment:    Type:   Image     Comment:   External Document

## 2010-04-09 NOTE — Assessment & Plan Note (Signed)
Summary: follow up on rx-lb   Vital Signs:  Patient profile:   76 year old male Height:      73 inches Weight:      229.50 pounds BMI:     30.39 O2 Sat:      96 % on Room air Temp:     97.2 degrees F oral Pulse rate:   53 / minute Pulse rhythm:   regular BP sitting:   138 / 66  (left arm) Cuff size:   large  Vitals Entered By: Rock Nephew CMA (October 29, 2009 1:11 PM)  O2 Flow:  Room air                                                                                                                                                                                                                                                                                                                                                                                                        CC: Pt c/o early Am BP readings Is Patient Diabetic? No Pain Assessment Patient in pain? no      Nutritional Status BMI of > 30 = obese  Does patient need assistance? Functional Status Self care Ambulation Normal   Primary Care Provider:  Illene Regulus, MD   CC:  Pt c/o early Am BP readings.  History of Present Illness: Jose Ware is a 76 yo healthy-appearing Caucasian male with a history of prostate CA presenting for f/u on his HTN. Today his bp is 138/66 and pulse is 53. His wife has taken his blood pressure consistently  throughout the day for the last few weeks averaging a systolic bp of around 160, but has been using an older machine and small cuff to take it. He reports having morning headaches three to four times a week and states he can feel when his blood pressure is elevated because he gets a flushed hot sensation over his face that is not visible, a fuzzy sensation in his head, and a feeling of imbalance.  Allergies (verified): 1)  ! Betadine  Comments:  Nurse/Medical Assistant: Per pt, never got Avodart Rock Nephew CMA (October 29, 2009 1:12 PM) The patient's medications were  reviewed with the patient and were updated in the Medication List.  Past History:  Past Medical History: Last updated: March 24, 2009 Hx of ADENOCARCINOMA, PROSTATE (ICD-185) XRT and radiation seed implantation in 2010 PERIPHERAL NEUROPATHY (ICD-356.9) PSORIASIS (ICD-696.1) INCI HERNIA WITHOUT MENTION OBSTRUCTION/GANGRENE (ICD-553.21) DEGENERATIVE JOINT DISEASE (ICD-715.90) HYPERTENSION (ICD-401.9) Hx of STREPTOCOCCAL PHARYNGITIS (ICD-034.0) Hx of TENOSYNOVITIS (ICD-727.00) NEPHROLITHIASIS, HX OF (ICD-V13.01) DIVERTICULITIS, HX OF (ICD-V12.79) Adenomatous Colon Polyps 08/1993  Physician Roster:            Vaughan Sine Homero Fellers [place]            GU- Su Grand  Past Surgical History: Last updated: 03/15/2007 * BACK SURGERY-minor disk surgery; instrumentation placed and removed in a second procedure CHOLECYSTECTOMY, HX OF (ICD-V45.79) '86 * REVERSAL OF COLOSTOMY '87 * SURGICAL REPAIR LEFT WRIST remote complicated with infection Hx of COLOSTOMY (ICD-V44.3) '85 after colectomy for diverticulitis ARTHROSCOPY, KNEE, HX OF (ICD-V45.89) left knee  '03 APPENDECTOMY, HX OF (ICD-V45.79)-remote '85 TONSILLECTOMY, HX OF (ICD-V45.79) as child Right knee surgery with cartilage removed '56  Family History: Last updated: 03-24-09 father- died 62, cirrhosis- alcohol mother - died 50, CAD, Liver trouble (?) sister - died 50, CHF, Breast cancer with arm involvement leading to amputation right arm. Neg- colon, prostate cancer; DM and  glaucoma  Social History: Last updated: 03-24-2009 [facility], [place].  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  The patient denies fever, weight loss, weight gain, vision  loss, decreased hearing, chest pain, syncope, abdominal pain, melena, hematochezia, severe indigestion/heartburn, hematuria, incontinence, suspicious skin lesions, unusual weight change, and abnormal bleeding.         positive - frequency, urgency, nocturia X3, mild dyspnea when cutting the grass    Physical Exam  General:  alert and healthy-appearing.   Head:  normocephalic and atraumatic.   Eyes:  pupils equal, pupils round, and pupils reactive to light. no papilledema. Ears:  R ear normal, L ear normal, and no external deformities.   Nose:  no external deformity and no external erythema.   Mouth:  no gingival abnormalities and pharynx pink and moist.   Lungs:  normal respiratory effort, no accessory muscle use, no crackles, and no wheezes.   Heart:  regular rhythm, no murmur, and bradycardia.   Abdomen:  soft, non-tender, no distention, no masses, no hepatomegaly, and no splenomegaly.   Pulses:  R radial normal and L radial normal.   Extremities:  No edema. Neurologic:  alert & oriented X3 and strength normal in all extremities.   Skin:  turgor normal, color normal, and no  suspicious lesions.   Psych:  Oriented X3, normally interactive, good eye contact, not anxious appearing, and not depressed appearing.     Impression & Recommendations:  Problem # 1:  HYPERTENSION (ICD-401.9)  Assessment - Patient is experiencing episodes of elevated blood pressure accompanied by flushing and headache but without sense of adrenaline rush (fight or flight sensation). He could potentially be suffering from an adrenal adenoma tumor "pheochromocytoma."  Plan - Rule out pheochromocytoma with 24 hr urine collection to be certain there is not an excess of catecholamines.           Have patient bring his blood pressure measurement equipment to the office so it can be measured here to compare with our equipment's readings.   His updated medication list for this problem includes:    Hydrochlorothiazide  25 Mg Tabs (Hydrochlorothiazide) .Marland Kitchen... Take one tablet daily overdue for cpx last saw md 03/2007    Taztia Xt 180 Mg Xr24h-cap (Diltiazem hcl er beads) .Marland Kitchen... 1 by mouth once daily for blood pressure  Orders: T-Urine 24 Hr. Catecholamines [phone]) T-Urine 24 Hr. Metanephrines [phone])  BP today: 138/66 Prior BP: 156/90 (10/01/2009)  Labs Reviewed: K+: 3.7 (12/14/2008) Creat: : 0.9 (12/14/2008)   Chol: 137 (12/14/2008)   HDL: 30.50 (12/14/2008)   LDL: 91 (12/14/2008)   TG: 79.0 (12/14/2008)  Complete Medication List: 1)  Hydrochlorothiazide 25 Mg Tabs (Hydrochlorothiazide) .... Take one tablet daily overdue for cpx last saw md 03/2007 2)  Avodart 0.5 Mg Caps (Dutasteride) .... One tablet by mouth once daily 3)  Taztia Xt 180 Mg Xr24h-cap (Diltiazem hcl er beads) .Marland Kitchen.. 1 by mouth once daily for blood pressure

## 2010-04-09 NOTE — Letter (Signed)
Summary: Patient Notice- Polyp Results  [facility]  [address] [place], [place] [address]   Phone: [phone]  Fax: [phone]        April 06, 2009 MRN: [id number]    [facility] [address] [place], [place]  [address]    Dear Mr. [facility],  I am pleased to inform you that the colon polyp(s) removed during your recent colonoscopy was (were) found to be benign (no cancer detected) upon pathologic examination.  I recommend you have a repeat colonoscopy examination in 5 years to look for recurrent polyps, as having colon polyps increases your risk for having recurrent polyps or even colon cancer in the future.  Should you develop new or worsening symptoms of abdominal pain, bowel habit changes or bleeding from the rectum or bowels, please schedule an evaluation with either your primary care physician or with me.  Continue treatment plan as outlined the day of your exam.  Please call us if you are having persistent problems or have questions about your condition that have not been fully answered at this time.  Sincerely,  Meryl Dare MD [facility]  This letter has been electronically signed by your physician.  Appended Document: Patient Notice- Polyp Results letter mailed to patient's home

## 2010-04-09 NOTE — Assessment & Plan Note (Signed)
Summary: Gastroenterology     [facility]   OFFICE NOTE  Jose Ware, Jose Ware        OFFICE NO:  [id number]    DATE:  08-17-00      The patient is a very nice 76 year old white male with a history of adenomatous colon polyps and a segmental sigmoid colectomy for diverticulitis.  His last colonoscopy was in September of 1998 and revealed only mild diverticulosis with no recurrent polyps.  He has no abdominal complaints except for occasional diarrhea following certain foods, such as salads.  He specifically denies any rectal bleeding.  His interval history since 1998 has been unremarkable and all notes from Dr. Debby Bud since I last saw the patient were reviewed.    MEDICATIONS:  Aspirin 325 mg q day; Tylenol p.r.n.; Aspercreme p.r.n.  ALLERGIES:  Betadine.  PHYSICAL EXAMINATION:  No acute distress.  Weight 250.  Blood pressure 150/84.  Pulse 68.  HEENT exam:  Anicteric sclerae; oropharynx clear.  Chest clear to auscultation and percussion.  Cardiac regular rate and rhythm, without murmurs.  Abdomen is soft, nontender, nondistended, with normoactive bowel sounds.  No palpable organomegaly, masses or hernias.  Rectal examination deferred.  Extremities without clubbing, cyanosis or edema.  Neurologic:  Alert and oriented x 3.  ASSESSMENT AND PLAN:   1.   Personal history of adenomatous colon polyps.  Risks, benefits and alternatives to colonoscopy with possible polypectomy discussed with the patient and he consents to proceed.  This will be scheduled electively. 2.   Status post sigmoid colectomy for diverticulitis.      Aleck Locklin T. Pleas Koch., M.D., F.A.C.G.  [id number]  D: 08/17/00  T: 08/17/00  Job # [id number]

## 2010-04-09 NOTE — Procedures (Signed)
Summary: Colonoscopy and biopsy   Colonoscopy  Procedure date:  08/26/2005  Findings:      Results: Polyp.  Results: Hemorrhoids.     Results: Diverticulosis.       Location:  [facility].    Procedures Next Due Date:    Colonoscopy: 09/2010 Patient Name: Jose Ware, Jose Ware MRN:  Procedure Procedures: Colonoscopy CPT: 45409.    with polypectomy. CPT: A3573898.  Personnel: Endoscopist: Venita Lick. Russella Dar, MD, Clementeen Graham.  Exam Location: Exam performed in Outpatient Clinic. Outpatient  Patient Consent: Procedure, Alternatives, Risks and Benefits discussed, consent obtained, from patient. Consent was obtained by the RN.  Indications  Surveillance of: Adenomatous Polyp(s). Initial polypectomy was performed in 1995. in Jun. 3 or more Polyps were found at Index Exam. Largest polyp removed was 6 to 9 mm. Prior polyp located in both proximal and distal colon. Pathology of worst  polyp: tubular adenoma.  History  Current Medications: Patient is not currently taking Coumadin.  Pre-Exam Physical: Performed Aug 26, 2005. Cardio-pulmonary exam, Rectal exam, HEENT exam , Abdominal exam, Mental status exam WNL.  Comments: Pt. history reviewed/updated, physical exam performed prior to initiation of sedation?Yes Exam Exam: Extent of exam reached: Cecum, extent intended: Cecum.  The cecum was identified by appendiceal orifice and IC valve. Time for Withdrawl: 00:10:03. Colon retroflexion performed. ASA Classification: II. Tolerance: excellent.  Monitoring: Pulse and BP monitoring, Oximetry used. Supplemental O2 given.  Colon Prep Used HalfLytely for colon prep. Prep results: fair, adequate exam.  Sedation Meds: Patient assessed and found to be appropriate for moderate (conscious) sedation. Fentanyl 50 mcg. given IV. Versed 6 mg. given IV.  Findings NORMAL EXAM: Hepatic Flexure to Splenic Flexure.  MULTIPLE POLYPS: Ascending Colon. minimum size 4 mm, maximum size 4 mm.  Procedure:  snare without cautery, removed, Polyp retrieved, 2 polyps Polyps sent to pathology. ICD9: Colon Polyps: 211.3.  DIVERTICULOSIS: Descending Colon. Not bleeding. ICD9: Diverticulosis: 562.10. Comments: mild.  - PRIOR SURGERY: Sigmoid Colon. Segmental Colectomy. Anastamosis present.  NORMAL EXAM: Cecum.  HEMORRHOIDS: Internal. Size: Small. Not bleeding. Not thrombosed. ICD9: Hemorrhoids, Internal: 455.0.   Assessment  Diagnoses: 562.10: Diverticulosis.  211.3: Colon Polyps.  455.0: Hemorrhoids, Internal.   Events  Unplanned Interventions: No intervention was required.  Unplanned Events: There were no complications. Plans  Post Exam Instructions: Post sedation instructions given. No aspirin or non-steroidal containing medications: 2 weeks.  Medication Plan: Await pathology.  Patient Education: Patient given standard instructions for: Polyps. Diverticulosis. Hemorrhoids.  Disposition: After procedure patient sent to recovery. After recovery patient sent home.  Scheduling/Referral: Colonoscopy, to [facility] T. Russella Dar, MD, [facility], around Aug 27, 2010.    This report was created from the original endoscopy report, which was reviewed and signed by the above listed endoscopist.    cc: Laureen Abrahams, MD        SP Surgical Pathology - STATUS: Final             By: Gwenlyn Saran  ,        Perform Date: 769 015 7287 00:01  Ordered By: Rica Records Date: 19Jun07 15:02  Facility: [facility]                               Department: CPATH  Service Report Text  [facility]   [address] [address]   [place], [place] [address]   Telephone (  [phone] or [phone] Fax [phone]    REPORT OF SURGICAL PATHOLOGY    Case #: [id number]   Patient Name: Jose Ware.   Office Chart Number: [id number]    MRN: [id number]   Pathologist: Berneta Levins, MD   DOB/Age 05/27/1934 (Age: 76) Gender: M   Date Taken: 08/26/2005    Date Received: 08/26/2005    FINAL DIAGNOSIS    ***MICROSCOPIC EXAMINATION AND DIAGNOSIS***    COLON, POLYP(S): TWO ADENOMATOUS POLYP(S). NO HIGH GRADE   DYSPLASIA OR INVASIVE MALIGNANCY IDENTIFIED. (BIOPSIES,   ASCENDING)    COMMENT   There is adenoma with nuclear stratification and decreased mucin.   High grade dysplasia or invasive malignancy are not identified.   The findings are consistent with an adenomatous polyp(s) if the   biopsy(ies) is (are) representative of the entire lesion(s).   (JAS:gt) 08/27/05    gdt   Date Reported: 08/27/2005 Berneta Levins, MD   *** Electronically Signed Out By JAS ***    Clinical information   Follow-up colon polyps. Rule out adenoma (jy)    specimen(s) obtained   Colon, polyp(s), ascendng    Gross Description   Received in formalin are tan, soft tissue fragments that are   submitted in toto. Number: 2   Size: 0.5 and 0.8 cm one block (TB:jes,   08/27/05)    jes/

## 2010-04-09 NOTE — Assessment & Plan Note (Signed)
Summary: rectal bleeding/lk   History of Present Illness Visit Type: consult  Primary GI MD: Elie Goody MD [facility] Primary Provider: Illene Regulus, MD  Requesting Provider: Danae Chen, MD Chief Complaint: BRB in stool after BMs, and diarrhea  History of Present Illness:   This is a 76 year old male with a history of prostate cancer, status post external beam radiation treatment, and then radiation seed implantation. He has had intermittent rectal bleeding and diarrhea for approximately 3 weeks. He had several episodes with a moderate amount of blood in his stool and has had other episodes with scant amounts of blood in his stool. The diarrhea, and bleeding, has generally improved over the past 2 weeks. Underwent colonoscopy for polyp surveillance June 2007 showing diverticulosis internal hemorrhoids and small colon polyps.  He has also had intermittent hematuria and difficulty voiding.   GI Review of Systems      Denies abdominal pain, acid reflux, belching, bloating, chest pain, dysphagia with liquids, dysphagia with solids, heartburn, loss of appetite, nausea, vomiting, vomiting blood, weight loss, and  weight gain.      Reports diarrhea and  rectal bleeding.     Denies anal fissure, black tarry stools, change in bowel habit, constipation, diverticulosis, fecal incontinence, heme positive stool, hemorrhoids, irritable bowel syndrome, jaundice, light color stool, liver problems, and  rectal pain.   Current Medications (verified): 1)  Hydrochlorothiazide 25 Mg  Tabs (Hydrochlorothiazide) .... Take One Tablet Daily Overdue For Cpx Last Saw Md 03/2007 2)  Avodart 0.5 Mg Caps (Dutasteride) .... One Tablet By Mouth Once Daily  Allergies (verified): 1)  ! Betadine  Past History:  Past Medical History: Hx of ADENOCARCINOMA, PROSTATE (ICD-185) XRT and radiation seed implantation in 2010 PERIPHERAL NEUROPATHY (ICD-356.9) PSORIASIS (ICD-696.1) INCI HERNIA WITHOUT MENTION  OBSTRUCTION/GANGRENE (ICD-553.21) DEGENERATIVE JOINT DISEASE (ICD-715.90) HYPERTENSION (ICD-401.9) Hx of STREPTOCOCCAL PHARYNGITIS (ICD-034.0) Hx of TENOSYNOVITIS (ICD-727.00) NEPHROLITHIASIS, HX OF (ICD-V13.01) DIVERTICULITIS, HX OF (ICD-V12.79) Adenomatous Colon Polyps 08/1993  Physician Roster:            Vaughan Sine Homero Fellers [place]            GU- Su Grand  Past Surgical History: Reviewed history from 03/15/2007 and no changes required. * BACK SURGERY-minor disk surgery; instrumentation placed and removed in a second procedure CHOLECYSTECTOMY, HX OF (ICD-V45.79) '86 * REVERSAL OF COLOSTOMY '87 * SURGICAL REPAIR LEFT WRIST remote complicated with infection Hx of COLOSTOMY (ICD-V44.3) '85 after colectomy for diverticulitis ARTHROSCOPY, KNEE, HX OF (ICD-V45.89) left knee  '03 APPENDECTOMY, HX OF (ICD-V45.79)-remote '85 TONSILLECTOMY, HX OF (ICD-V45.79) as child Right knee surgery with cartilage removed '56  Family History: Reviewed history from 10/15/2007 and no changes required. father- died 61, cirrhosis- alcohol mother - died 65, CAD, Liver trouble (?) sister - died 60, CHF, Breast cancer with arm involvement leading to amputation right arm. Neg- colon, prostate cancer; DM and  glaucoma  Social History: [employer], [place].  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         The pertinent positives and negatives are noted as above and in the HPI. All  other ROS were reviewed and were negative.   Vital Signs:  Patient profile:   76 year old male Height:      73 inches Weight:      227 pounds BMI:      30.06 BSA:     2.27 Pulse rate:   60 / minute Pulse rhythm:   regular BP sitting:   132 / 80  (left arm) Cuff size:   regular  Vitals Entered By: Ok Anis CMA (March 21, 2009 2:56 PM)  Physical Exam  General:  Well developed, well nourished, no acute distress. Head:  Normocephalic and atraumatic. Eyes:  PERRLA, no icterus. Ears:  Normal auditory acuity. Mouth:  No deformity or lesions, dentition normal. Neck:  Supple; no masses or thyromegaly. Lungs:  Clear throughout to auscultation. Heart:  Regular rate and rhythm; no murmurs, rubs,  or bruits. Abdomen:  Soft, nontender and nondistended. No masses, hepatosplenomegaly or hernias noted. Normal bowel sounds. Rectal:  deferred until time of colonoscopy.  Exam performed by Dr. Brunilda Payor on 03/12/08 without lesions noted Msk:  Symmetrical with no gross deformities. Normal posture. Pulses:  Normal pulses noted. Extremities:  No clubbing, cyanosis, edema or deformities noted. Neurologic:  Alert and  oriented x4;  grossly normal neurologically. Cervical Nodes:  No significant cervical adenopathy. Inguinal Nodes:  No significant inguinal adenopathy. Psych:  Alert and cooperative. Normal mood and affect.  Impression & Recommendations:  Problem # 1:  BLOOD IN STOOL (ICD-578.1) Rule out radiation proctopathy, hemorrhoidal bleeding, colorectal neoplasms, and other disorders. The risks, benefits and alternatives to colonoscopy with possible biopsy, possible APC, possible hemorrhoid sclerosis and possible polypectomy were discussed with the patient and they consent to proceed. The procedure will be scheduled electively. Orders: ZCOL APC (ZCOL APC)  Problem # 2:  DIARRHEA (ICD-787.91) Rule out radiation proctopathy. Orders: ZCOL APC (ZCOL APC)  Problem # 3:  COLONIC POLYPS, HX OF (ICD-V12.72) Adenomatous colon polyps, initially diagnosed in 1995. Orders: ZCOL APC (ZCOL APC)  Patient Instructions: 1)  Colonoscopy brochure given.  2)   Conscious Sedation brochure given.  3)  Copy sent to : Lindaann Slough, MD 4)  The medication list was reviewed and reconciled.  All changed / newly prescribed medications were explained.  A complete medication list was provided to the patient / caregiver.  Prescriptions: MOVIPREP 100 GM  SOLR (PEG-KCL-NACL-NASULF-NA ASC-C) As per prep instructions.  #1 x 0   Entered by:   Christie Nottingham CMA (AAMA)   Authorized by:   Meryl Dare MD [facility]   Signed by:   Meryl Dare MD FACG on 03/21/2009   Method used:   Electronically to        [facility]  [phone]* (retail)       [address]       [place], [place]  [address]       Ph: [phone] or [phone]       Fax: [phone]   RxID:   [id number]

## 2010-04-09 NOTE — Progress Notes (Signed)
Summary: rectal bleeding & sooner appt   Phone Note Call from Patient Call back at Home Phone [phone]   Caller: Patient Summary of Call: patient has rectal has rectal bleeding that [facility] referred him back to Dr. Russella Dar for. The first appt that Dr. Russella Dar has open is 04/11/2009 I gave it to the referral scheduler at [facility] but she would like a nurse to call the patient and see if we could get him sooner. Dr Brunilda Payor ordered a CT at their office and will fax it with any recent records.  Initial call taken by: Harlow Mares CMA Duncan Dull),  March 19, 2009 2:16 PM  Follow-up for Phone Call        patient rescheduled to 03-21-09 3:15.  Patient  will arrive at 3:00  Follow-up by: Darcey Nora RN, CGRN,  March 20, 2009 10:42 AM

## 2010-04-09 NOTE — Letter (Signed)
Summary: [facility]  [facility]   Imported By: Lester Warsaw 03/19/2009 09:34:47  _____________________________________________________________________  External Attachment:    Type:   Image     Comment:   External Document

## 2010-04-09 NOTE — Assessment & Plan Note (Signed)
Summary: 2 weeks/ mbw   Visit Type:  Follow-up Copy to:  [facility]p Primary Provider/Referring Provider:  Illene Regulus, MD   CC:  Post sleep study visit.  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 c/o morning headaches, high BP in am 180 / 80-90, nocturia - h/o prostate CA s/p seed implant  snores when on back Epworth Sleepiness Score 12/24 - esp if lying down to rest in afternoons, occ snoring, wakes up 0630 - 0730 , tired, 2 cups coffee in a m, 1-2 remainder of the day, no sodas. No witnessed apneas bedtime 10-11pm, minimal latency, sleeps on back or side,  lost 95 lbs over last 4 yrs!  January 16, 2010 1:40 PM  Reviewed PSG with him, TST 4.5 h,  REM 16%, only 10 min supine sleep AHI was 1.5 /h, 19 RERAs with RDI 6/h   He c/o nocturia & morning rise of BP There is no history suggestive of cataplexy, sleep paralysis or parasomnias   Preventive Screening-Counseling & Management  Alcohol-Tobacco     Alcohol drinks/day: <1     Alcohol type: wine     >5/day in last 3 mos: yes     Smoking Status: quit     Packs/Day: 1.0     Year Started: 1961     Year Quit: 1983     Pack years: 20 pack years  Allergies: 1)  ! Betadine  Past History:  Past Medical History: Last updated: 12/27/2009 Hx of ADENOCARCINOMA, PROSTATE (ICD-185) XRT and radiation seed implantation in 2010 PERIPHERAL NEUROPATHY (ICD-356.9) PSORIASIS (ICD-696.1) INCI HERNIA WITHOUT MENTION OBSTRUCTION/GANGRENE (ICD-553.21) DEGENERATIVE JOINT DISEASE (ICD-715.90) HYPERTENSION (ICD-401.9) Hx of STREPTOCOCCAL PHARYNGITIS (ICD-034.0) Hx of TENOSYNOVITIS (ICD-727.00) NEPHROLITHIASIS, HX OF (ICD-V13.01) DIVERTICULITIS, HX OF (ICD-V12.79) Adenomatous Colon Polyps 08/1993  Physician Roster:            Vaughan Sine Homero Fellers [place]            GU- Vernia Buff Nesi            GI - Claudette Head  Social History: Last updated: 12/27/2009 [facility] [place]  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; remarried '03 3 sons-'63, '65, '67 grandchildren 65; 1 great-grand daughter Patient is a former smoker: quit 25 yrs ago  Alcohol Use - no Daily Caffeine Use: 3-4 cups of coffee daily  Illicit Drug Use - no  Review of Systems  The patient denies anorexia, fever, weight loss, weight gain, vision loss, decreased hearing, hoarseness, chest pain, syncope, dyspnea on exertion, peripheral edema, prolonged cough, headaches, hemoptysis, abdominal pain, melena, hematochezia, severe indigestion/heartburn, hematuria, muscle weakness, suspicious skin lesions, difficulty walking, depression, unusual weight change, abnormal bleeding, enlarged lymph nodes, and angioedema.    Vital Signs:  Patient profile:   76 year old male Height:      73 inches Weight:      231 pounds BMI:     30.59 O2 Sat:      96 % on Room air Temp:     98.6 degrees F oral Pulse rate:   50 / minute BP sitting:   132 / 76  (right arm) Cuff size:   regular  Vitals Entered By: Zackery Barefoot CMA (January 16, 2010 1:30 PM)  O2 Flow:  Room air CC: Post sleep study visit Comments Medications reviewed with patient Verified contact number and pharmacy with patient Zackery Barefoot CMA  January 16, 2010 1:31 PM  Physical Exam  Additional Exam:  wt 231 January 16, 2010  Gen. Pleasant, well-nourished, in no distress, normal affect ENT - no lesions, no post nasal drip, class 2 airway Neck: No JVD, no thyromegaly, no carotid bruits Lungs: no use of accessory muscles, no dullness to percussion, clear without rales or rhonchi  Cardiovascular: Rhythm regular, heart sounds  normal, no murmurs or gallops, no peripheral edema Musculoskeletal: No deformities, no cyanosis or clubbing      Impression & Recommendations:  Problem # 1:  HYPERSOMNIA (ICD-780.54)  Sleep disordered breathing is not severe enough to warrant treatment. Does not explain morning rise of  BP or nocturia. Some increase in upper airway resistance , likley to worsen i fhe gains weight Some more weight loss advised for now. Sleep hygiene discussed  Orders: Est. Patient Level III ([address])  Patient Instructions: 1)  Copy sent to: dr Debby Bud 2)  Please schedule a follow-up appointment as needed. 3)  You have increased upper airway resistance in your sleep  - please keep the weight off, no Rx required.

## 2010-04-09 NOTE — Letter (Signed)
[facility] [address] [place], [place]  [address] Phone: [phone]      November 13, 2009   [facility] [address] [place], [place] [address]  RE:  LAB RESULTS  Dear  Mr. [name],  The following is an interpretation of your most recent lab tests.  Please take note of any instructions provided or changes to medications that have resulted from your lab work.  24 hour urine studies, looking for possible cause for variable blood pressure, were normal   Please come see me if you have any questions about these lab results    Sincerely Yours,    Jacques Navy MD  Patient: Jose Ware Note: All result statuses are Final unless otherwise noted.  Tests: (1) Metanephrines, Fractionated, Urine (29528) ! Total Volume, Urine       1975 mL   Metanephrine         [L]  79 mcg/24 h                 90-315   Normetanephrine           295 mcg/24 h                122-676   Metanephrines, Total      374 mcg/24 h                224-832     A four fold elevation of urinary normetanephrines     is extremely likely to be due to a tumor, while a     four fold elevation of urinary metanephrines is     highly suggestive, but not diagnostic of the tumor.     Measurement of plasma Metanephrines and Chromogranin     A is recommended for confirmation.  Tests: (2) Catecholamines Fractionated 24Hr UA (41324) ! Total Volume              1975 mL ! Epinephrine, 24 hr Urine                             REPORT     Results are below the reportable range for this     analyte, which is 2.0 mcg/L. ! Norepinephrine, 24 hr Ur                             31 mcg/24 h                 15-100 ! Calculated Total (E+NE)                             31 mcg/24 h                 26-121 ! Dopamine, 24 hr Urine                             131 mcg/24 h                52-480 ! Creatinine, 24-Hour Urine                             1.68 g/24 h                 0.63-2.50

## 2010-04-09 NOTE — Progress Notes (Signed)
   Phone Note Outgoing Call   Call placed by: Willette Cluster, NP-C Call placed to: Patient Summary of Call: Called patient for condition update. His abdominal pain is better, had total of only 2 loose stools yesterday. He will complete antibiotics and keep follow up appt.

## 2010-04-09 NOTE — Progress Notes (Signed)
Summary: B12 APT  Phone Note Outgoing Call   Reason for Call: Discuss lab or test results Summary of Call: full report of exam soming soon, but B12 was low at 128. May start on B12 IM monthly. Does he want his wife to give him the injections are come by the office.? Initial call taken by: Jacques Navy MD,  December 30, 2009 9:17 PM  Follow-up for Phone Call        pt will talk to his wife and call us back regarding the B12 injections Follow-up by: Ami Bullins CMA,  December 31, 2009 12:00 PM  Additional Follow-up for Phone Call Additional follow up Details #1::        Pt would like injection at our office, please call pt and set up for B12 nurse vist, THANKS Additional Follow-up by: Lamar Sprinkles, CMA,  December 31, 2009 4:21 PM    Additional Follow-up for Phone Call Additional follow up Details #2::    Spoke w/wife, she will look into  the cost of nurse vist's monthly and call back if she wants to schedule at office. Otherwise, rx was sent in for wife (retired [employer] fundraiser) to give med to pt Follow-up by: Lamar Sprinkles, CMA,  January 01, 2010 3:56 PM  New/Updated Medications: CYANOCOBALAMIN 1000 MCG/ML SOLN (CYANOCOBALAMIN) 1000 micrograms intramuscularly once a month * 3 ML SYRINGE 25 GUAGE 1/2 NEEDLE use with b-12 monthy injections Prescriptions: 3 ML SYRINGE 25 GUAGE 1/2 NEEDLE use with b-12 monthy injections  #6 x 1   Entered by:   Lamar Sprinkles, CMA   Authorized by:   Jacques Navy MD   Signed by:   Lamar Sprinkles, CMA on 01/01/2010   Method used:   Faxed to ...       [facility]  [address]  [phone]* (retail)       [address]       [place], [place]  [address]       Ph: [phone] or [phone]       Fax: [phone]   RxID:   [id number] CYANOCOBALAMIN 1000 MCG/ML SOLN (CYANOCOBALAMIN) 1000 micrograms intramuscularly once a month  #30 x 1   Entered by:   Lamar Sprinkles, CMA   Authorized by:   Jacques Navy MD   Signed by:   Lamar Sprinkles, CMA on 01/01/2010   Method used:   Electronically to        [facility]  [phone]* (retail)       [address]       [place], [place]  [address]       Ph: [phone] or [phone]       Fax: [phone]   RxID:   [id number]

## 2010-04-09 NOTE — Assessment & Plan Note (Signed)
Summary: ELEVATED BP/NWS   Vital Signs:  Patient profile:   76 year old male Height:      73 inches (185.42 cm) Weight:      213 pounds (96.82 kg) O2 Sat:      96 % on Room air Temp:     98.3 degrees F (36.83 degrees C) oral Pulse rate:   59 / minute Pulse rhythm:   regular BP sitting:   156 / 90  (left arm) Cuff size:   regular  Vitals Entered By: Bill Salinas CMA (October 01, 2009 4:06 PM)  O2 Flow:  Room air CC: Elevated BP/Rx refill on HCTZ/aj   Primary Care Provider:  Illene Regulus, MD   CC:  Elevated BP/Rx refill on HCTZ/aj.  History of Present Illness: Patient presents for BP control. He reports that his pressure has been running into the 170's and that he has been symptomatic with lightheaded dizziness. No focal neurologic symptoms, no visual changes, no epistaxis, no chest pain.   His last OV was in '09. In the interval he has been under the care of Dr. Brunilda Payor for prostate cancer, which is now under control with last PSA 0.13.   Current Medications (verified): 1)  Hydrochlorothiazide 25 Mg  Tabs (Hydrochlorothiazide) .... Take One Tablet Daily Overdue For Cpx Last Saw Md 03/2007 2)  Avodart 0.5 Mg Caps (Dutasteride) .... One Tablet By Mouth Once Daily  Allergies (verified): 1)  ! Betadine  Past History:  Past Medical History: Last updated: 04/09/09 Hx of ADENOCARCINOMA, PROSTATE (ICD-185) XRT and radiation seed implantation in 2010 PERIPHERAL NEUROPATHY (ICD-356.9) PSORIASIS (ICD-696.1) INCI HERNIA WITHOUT MENTION OBSTRUCTION/GANGRENE (ICD-553.21) DEGENERATIVE JOINT DISEASE (ICD-715.90) HYPERTENSION (ICD-401.9) Hx of STREPTOCOCCAL PHARYNGITIS (ICD-034.0) Hx of TENOSYNOVITIS (ICD-727.00) NEPHROLITHIASIS, HX OF (ICD-V13.01) DIVERTICULITIS, HX OF (ICD-V12.79) Adenomatous Colon Polyps 08/1993  Physician Roster:            Leonarda Salon [place]            GU- Su Grand  Past Surgical History: Last updated: 03/15/2007 * BACK SURGERY-minor disk surgery;  instrumentation placed and removed in a second procedure CHOLECYSTECTOMY, HX OF (ICD-V45.79) '86 * REVERSAL OF COLOSTOMY '87 * SURGICAL REPAIR LEFT WRIST remote complicated with infection Hx of COLOSTOMY (ICD-V44.3) '85 after colectomy for diverticulitis ARTHROSCOPY, KNEE, HX OF (ICD-V45.89) left knee  '03 APPENDECTOMY, HX OF (ICD-V45.79)-remote '85 TONSILLECTOMY, HX OF (ICD-V45.79) as child Right knee surgery with cartilage removed '56  Family History: Last updated: 04/09/09 father- died 59, cirrhosis- alcohol mother - died 40, CAD, Liver trouble (?) sister - died 54, CHF, Breast cancer with arm involvement leading to amputation right arm. Neg- colon, prostate cancer; DM and  glaucoma  Social History: Last updated: 04/09/09 [facility]  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    Physical Exam  General:  WNWD heavyset white male in no distress Head:  normocephalic and atraumatic.   Eyes:  C&S clear, fundi normal Neck:  supple.   Lungs:  normal respiratory effort and normal breath sounds.   Heart:  normal rate and regular rhythm.   Msk:  normal ROM.   Pulses:  2+ radial Neurologic:  alert & oriented X3, cranial nerves II-XII intact, and gait normal.   Skin:  turgor normal and color normal.   Psych:  Oriented X3, memory intact for recent and remote, normally interactive, and  good eye contact.     Impression & Recommendations:  Problem # 1:  Hx of ADENOCARCINOMA, PROSTATE (ICD-185) Stable at the completion of treatment. He is to follow-up with Dr. Brunilda Payor  Problem # 2:  HYPERTENSION (ICD-401.9) Poor control.  Plan continue HCTZ         add diltiazem long-acting 180mg  daily         recheck BP 2-3 weeks.   His updated medication list for this problem includes:    Hydrochlorothiazide 25 Mg Tabs (Hydrochlorothiazide) .Marland Kitchen... Take one tablet daily overdue for cpx last saw md 03/2007    Taztia Xt 180 Mg Xr24h-cap (Diltiazem hcl er beads) .Marland Kitchen... 1 by mouth once daily for blood pressure  Complete Medication List: 1)  Hydrochlorothiazide 25 Mg Tabs (Hydrochlorothiazide) .... Take one tablet daily overdue for cpx last saw md 03/2007 2)  Avodart 0.5 Mg Caps (Dutasteride) .... One tablet by mouth once daily 3)  Taztia Xt 180 Mg Xr24h-cap (Diltiazem hcl er beads) .Marland Kitchen.. 1 by mouth once daily for blood pressure Prescriptions: HYDROCHLOROTHIAZIDE 25 MG  TABS (HYDROCHLOROTHIAZIDE) Take one tablet daily OVERDUE FOR CPX last saw md 03/2007  #90 x 3   Entered and Authorized by:   Jacques Navy MD   Signed by:   Jacques Navy MD on 10/01/2009   Method used:   Electronically to        [facility]  [phone]* (retail)       [address]       [place], [place]  [address]       Ph: [phone] or [phone]       Fax: [phone]   RxID:   [id number] TAZTIA XT 180 MG XR24H-CAP (DILTIAZEM HCL ER BEADS) 1 by mouth once daily for blood pressure  #30 x 12   Entered and Authorized by:   Jacques Navy MD   Signed by:   Jacques Navy MD on 10/01/2009   Method used:   Electronically to        [facility]  [phone]* (retail)       [address]       [place], [place]  [address]       Ph: [phone] or [phone]       Fax: [phone]   RxID:   [id number]

## 2010-04-09 NOTE — Progress Notes (Signed)
Summary: Sleep Results/appt  Phone Note Outgoing Call   Call placed by: Comer Locket. Vassie Loll MD,  January 09, 2010 1:36 PM Reason for Call: Discuss lab or test results Summary of Call: left message for pt to call back Pl let him know that PSG did not show significant obstructive sleep apnea  - no further recommendations. Copy sen tot dr Debby Bud. He should keep his weight down Initial call taken by: Comer Locket. Vassie Loll MD,  January 09, 2010 1:37 PM  Follow-up for Phone Call        Spoke with patient-aware of RA's recs and will keep appt for 01-16-10.Reynaldo Minium CMA  January 09, 2010 3:04 PM

## 2010-04-09 NOTE — Procedures (Signed)
Summary: Colonoscopy   Colonoscopy  Procedure date:  09/02/2000  Findings:      Results: Hemorrhoids.     Results: Diverticulosis.       Location:  [facility].    Procedures Next Due Date:    Colonoscopy: 09/2005 Patient Name: Hudson, Majkowski MRN:  Procedure Procedures: Colonoscopy CPT: 04540.  Personnel: Endoscopist: Venita Lick. Russella Dar, MD, Clementeen Graham.  Referred By: Rosalyn Gess. Norins, MD.  Exam Location: Exam performed in Outpatient Clinic. Outpatient  Patient Consent: Procedure, Alternatives, Risks and Benefits discussed, consent obtained, from patient.  Indications  Surveillance of: Adenomatous Polyp(s). Initial polypectomy was performed in 1995. in Jun. 3 or more Polyps were found at Index Exam. Largest polyp removed was 6 to 9 mm. Prior polyp located in both proximal and distal colon. Pathology of worst  polyp: tubular adenoma.  History  Pre-Exam Physical: Performed Sep 02, 2000. Cardio-pulmonary exam, Rectal exam, HEENT exam , Abdominal exam, Extremity exam, Neurological exam, Mental status exam WNL.  Exam Exam: Extent of exam reached: Cecum, extent intended: Cecum.  The cecum was identified by appendiceal orifice and IC valve. Colon retroflexion performed. ASA Classification: II. Tolerance: good.  Monitoring: Pulse and BP monitoring, Oximetry used. Supplemental O2 given.  Colon Prep Used Golytely for colon prep. Prep results: good.  Sedation Meds: Patient assessed and found to be appropriate for moderate (conscious) sedation. Fentanyl 50 mcg. Versed 5 mg.  Findings NORMAL EXAM: Cecum to Splenic Flexure.  DIVERTICULOSIS: Descending Colon. Not bleeding. ICD9: Diverticulosis: 562.10. Comments: mild diverticulosis.  - PRIOR SURGERY: Sigmoid Colon. Segmental Colectomy. Anastamosis present.  HEMORRHOIDS: Internal. Size: Small. Not bleeding. Not thrombosed. ICD9: Hemorrhoids, Internal: 455.0.   Assessment  Diagnoses: 562.10: Diverticulosis.    455.0: Hemorrhoids, Internal.   Events  Unplanned Interventions: No intervention was required.  Unplanned Events: There were no complications. Plans Medication Plan: Continue current medications.  Disposition: After procedure patient sent to recovery. After recovery patient sent home.  Scheduling/Referral: Colonoscopy, to [facility] T. Russella Dar, MD, [facility], around Sep 02, 2005.  Referring provider, to Casimiro Needle E. Norins, MD, Sep 03, 2000.    This report was created from the original endoscopy report, which was reviewed and signed by the above listed endoscopist.    cc: Rosalyn Gess. Norins, MD

## 2010-04-09 NOTE — Procedures (Signed)
Summary: Instructions for procedure/[facility] (out pt)  Instructions for procedure/[facility] (out pt)   Imported By: Sherian Rein 03/26/2009 09:18:05  _____________________________________________________________________  External Attachment:    Type:   Image     Comment:   External Document

## 2010-04-09 NOTE — Assessment & Plan Note (Signed)
Summary: HYPERSOMNIA ///kp   Visit Type:  Initial Consult Copy to:  pcp Primary Etheridge Geil/Referring Kemp Gomes:  Illene Regulus, MD   CC:  Pt here for sleep consult. Pt c/o waking up multiple times during the night and waking up with headaches.  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 c/o morning headaches, high BP in am 180 / 80-90, nocturia - h/o prostate CA s/p seed implant  snores when on back Epworth Sleepiness Score 12/24 - esp if lying down to rest in afternoons, occ snoring, wakes up 0630 - 0730 , tired, 2 cups coffee in a m, 1-2 remainder of the day, no sodas. No witnessed apneas bedtime 10-11pm, minimal latency, sleeps on back or side,  lost 95 lbs over last 4 yrs! There is no history suggestive of cataplexy, sleep paralysis or parasomnias   Preventive Screening-Counseling & Management  Alcohol-Tobacco     Alcohol type: social drinker     Smoking Status: quit     Packs/Day: 1.0     Year Started: 1961     Year Quit: 1985     Pack years: 20 pack years   History of Present Illness: Wake up 2 to 3 times a night to go to bathroom. Blood pressure is up in morning (180-185/80-90  What time do you typically go to bed?(between what hours): 10 to 11 pm  How long does it take you to fall asleep? 15 mins or so  How many times during the night do you wake up? 2 to 3  What time do you get out of bed to start your day? 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 (in pounds): 5 lbs +-  Have you ever had a sleep study before?  If yes,when and where: no  Do you currently use CPAP ? If so , at what pressure? no  Do you wear oxygen at any time? If yes, how many liters per minute? no Current Medications (verified): 1)  Furosemide 40 Mg Tabs (Furosemide) .Marland Kitchen.. 1 By Mouth Once Daily 2)  Diltiazem Hcl Coated  Beads 240 Mg Xr24h-Cap (Diltiazem Hcl Coated Beads) .Marland Kitchen.. 1 By Mouth Once Daily 3)  Aspirin 325 Mg  Tabs (Aspirin) .... Take 1 Tablet By Mouth Once A Day  Allergies (verified): 1)  ! Betadine  Past History:  Past Medical History: Last updated: 04/17/09 Hx of ADENOCARCINOMA, PROSTATE (ICD-185) XRT and radiation seed implantation in 2010 PERIPHERAL NEUROPATHY (ICD-356.9) PSORIASIS (ICD-696.1) INCI HERNIA WITHOUT MENTION OBSTRUCTION/GANGRENE (ICD-553.21) DEGENERATIVE JOINT DISEASE (ICD-715.90) HYPERTENSION (ICD-401.9) Hx of STREPTOCOCCAL PHARYNGITIS (ICD-034.0) Hx of TENOSYNOVITIS (ICD-727.00) NEPHROLITHIASIS, HX OF (ICD-V13.01) DIVERTICULITIS, HX OF (ICD-V12.79) Adenomatous Colon Polyps 08/1993  Physician Roster:            Leonarda Salon [place]            GU- Su Grand  Past Surgical History: Last updated: 03/15/2007 * BACK SURGERY-minor disk surgery; instrumentation placed and removed in a second procedure CHOLECYSTECTOMY, HX OF (ICD-V45.79) '86 * REVERSAL OF COLOSTOMY '87 * SURGICAL REPAIR LEFT WRIST remote complicated with infection Hx of COLOSTOMY (ICD-V44.3) '85 after colectomy for diverticulitis ARTHROSCOPY, KNEE, HX OF (ICD-V45.89) left knee  '03 APPENDECTOMY, HX OF (ICD-V45.79)-remote '85 TONSILLECTOMY, HX OF (ICD-V45.79) as child Right knee surgery with cartilage removed '56  Family History: Last updated: 04/17/2009 father- died 69, cirrhosis-  alcohol mother - died 12, CAD, Liver trouble (?) sister - died 82, CHF, Breast cancer with arm involvement leading to amputation right arm. Neg- colon, prostate cancer; DM and  glaucoma  Social History: Last updated: 03/21/2009 [facility], [place]  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  The patient denies shortness of breath with activity, shortness of breath at rest, productive cough, non-productive cough, coughing up blood, chest pain, irregular heartbeats, acid heartburn, indigestion, loss of appetite, weight change, abdominal pain, difficulty swallowing, sore throat, nasal congestion/difficulty breathing through nose, sneezing, itching, ear ache, anxiety, depression, hand/feet swelling, rash, change in color of mucus, and fever.    Vital Signs:  Patient profile:   76 year old male Height:      73 inches Weight:      229 pounds O2 Sat:      96 % on Room air Temp:     98.6 degrees F oral Pulse rate:   55 / minute BP sitting:   138 / 74  (left arm) Cuff size:   large  Vitals Entered By: Zackery Barefoot CMA (November 22, 2009 3:11 PM)  O2 Flow:  Room air CC: Pt here for sleep consult. Pt c/o waking up multiple times during the night and waking up with headaches Comments Medications reviewed with patient Verified contact number and pharmacy with patient Zackery Barefoot CMA  November 22, 2009 3:28 PM    Physical Exam  Additional Exam:  Gen. Pleasant, well-nourished, in no distress, normal affect ENT - no lesions, no post nasal drip, class 2 airway Neck: No JVD, no thyromegaly, no carotid bruits Lungs: no use of accessory muscles, no dullness to percussion, clear without rales or rhonchi  Cardiovascular: Rhythm regular, heart sounds  normal, no murmurs or gallops, no peripheral edema Abdomen: soft and non-tender, no hepatosplenomegaly, BS normal. Musculoskeletal: No deformities, no cyanosis or clubbing Neuro:  alert, non focal     Impression & Recommendations:  Problem # 1:  HYPERSOMNIA (ICD-780.54) Given some degree of excessive daytime somnolence, narrow pharyngeal exam, morning headaches & am rise in  BP, obstructive sleep apnea is probable & an overnight PSG will be scheduled. The pathophysiology of obstructive sleep apnea, it's cardiovascular consequences and modes of treatment including CPAP were discussed with the patient in great detail.  Orders: Sleep Disorder Referral (Sleep Disorder) Consultation Level III ([address])  Medications Added to Medication List This Visit: 1)  Aspirin 325 Mg Tabs (Aspirin) .... Take 1 tablet by mouth once a day  Patient Instructions: 1)  Copy sent to: DR NORINS 2)  Please schedule a follow-up appointment in 2 weeks after your study

## 2010-04-09 NOTE — Letter (Signed)
Summary: [facility]  [facility]   Imported By: Lester [place] 06/26/2009 10:32:15  _____________________________________________________________________  External Attachment:    Type:   Image     Comment:   External Document

## 2010-04-09 NOTE — Progress Notes (Signed)
Summary: Triage   Phone Note Call from Patient Call back at Home Phone [phone]   Caller: Patient Call For: Dr. Russella Dar Reason for Call: Talk to Nurse Details for Reason: Triage Summary of Call: Pt. called complaining of excessive diarrhea.  Can't control bowels.  Also having pain and stomach being tender to the touch along with trouble eating.  Needs to be seen sooner rather than later.  Please call and advise. Initial call taken by: Schuyler Amor,  January 23, 2010 9:19 AM  Follow-up for Phone Call        Patient  c/o diarrhea and abdominal pain.  Wants to be seen prior to the first available.  Having change in bowel habits and nocturnal incontinence.  Hx of prostate CA.  Patient will come in and see Willette Cluster RNP tomorrow at 9:00 Follow-up by: Darcey Nora RN, CGRN,  January 23, 2010 9:45 AM

## 2010-04-11 NOTE — Assessment & Plan Note (Signed)
Summary: F/U Diverticulitis, saw Lafe Garin ACNP   History of Present Illness Visit Type: Follow-up Visit Primary GI MD: Elie Goody MD [facility] Primary Provider: Illene Regulus, MD Requesting Provider: n/a Chief Complaint: follow-up diverticulitis, c/o constipation now since being on low fiiber diet, c/o fatigue History of Present Illness:   This is a return office visit for diverticulitis, which completely resolve after a course of Cipro and Flagyl. Patient's wife is with him today. The patient notes constipation that began while using a low residue diet.   GI Review of Systems      Denies abdominal pain, acid reflux, belching, bloating, chest pain, dysphagia with liquids, dysphagia with solids, heartburn, loss of appetite, nausea, vomiting, vomiting blood, weight loss, and  weight gain.      Reports change in bowel habits and  constipation.     Denies anal fissure, black tarry stools, diarrhea, diverticulosis, fecal incontinence, heme positive stool, hemorrhoids, irritable bowel syndrome, jaundice, light color stool, liver problems, rectal bleeding, and  rectal pain.   Current Medications (verified): 1)  Furosemide 40 Mg Tabs (Furosemide) .Marland Kitchen.. 1 By Mouth Once Daily 2)  Diltiazem Hcl Coated Beads 240 Mg Xr24h-Cap (Diltiazem Hcl Coated Beads) .Marland Kitchen.. 1 By Mouth Once Daily 3)  Aspirin 325 Mg  Tabs (Aspirin) .... Take 1 Tablet By Mouth Once A Day 4)  Cyanocobalamin 1000 Mcg/ml Soln (Cyanocobalamin) .Marland Kitchen.. 1000 Micrograms Intramuscularly Once A Month 5)  3 Ml Syringe 25 Guage 1/2 Needle .... Use With B-12 Monthy Injections 6)  Tylenol 325 Mg Tabs (Acetaminophen) .... Take As Needed  Allergies (verified): 1)  ! Betadine  Past History:  Past Medical History: Reviewed history from 12/27/2009 and no changes required. Hx of ADENOCARCINOMA, PROSTATE (ICD-185) XRT and radiation seed implantation in 2010 PERIPHERAL NEUROPATHY (ICD-356.9) PSORIASIS (ICD-696.1) INCI HERNIA WITHOUT MENTION  OBSTRUCTION/GANGRENE (ICD-553.21) DEGENERATIVE JOINT DISEASE (ICD-715.90) HYPERTENSION (ICD-401.9) Hx of STREPTOCOCCAL PHARYNGITIS (ICD-034.0) Hx of TENOSYNOVITIS (ICD-727.00) NEPHROLITHIASIS, HX OF (ICD-V13.01) DIVERTICULITIS, HX OF (ICD-V12.79) Adenomatous Colon Polyps 08/1993  Physician Roster:            Vaughan Sine Homero Fellers [place]            GU- Vernia Buff Nesi            GI - Claudette Head  Past Surgical History: Reviewed history from 01/24/2010 and no changes required. * BACK SURGERY-minor disk surgery; instrumentation placed and removed in a second procedure CHOLECYSTECTOMY, HX OF (ICD-V45.79) '86 * REVERSAL OF COLOSTOMY '87 * SURGICAL REPAIR LEFT WRIST remote complicated with infection Hx of COLOSTOMY (ICD-V44.3) '85 after colectomy for diverticulitis ARTHROSCOPY, KNEE, HX OF (ICD-V45.89) left knee  '03 APPENDECTOMY, HX OF (ICD-V45.79)-remote '85 TONSILLECTOMY, HX OF (ICD-V45.79) as child Right knee surgery with cartilage removed '56 Bilateral Cataract Extractions  Family History: Reviewed history from 01/24/2010 and no changes required. father- died 44, cirrhosis- alcohol mother - died 75, CAD, Liver trouble (?) sister - died 81, CHF, Breast cancer with arm involvement leading to amputation right arm. brother - deceased CAD/MI brother - died unknown cause Neg- colon, prostate cancer; DM and  glaucoma No FH of Colon Cancer: Family History of Clotting disorder: 1/2 brother?  Social History: Reviewed history from 01/24/2010 and no changes required. [facility] [place], [place].  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 All  other ROS were reviewed and were negative.   Vital Signs:  Patient profile:   76 year old male Height:      73 inches Weight:      227 pounds BMI:     30.06 Pulse rate:   64 / minute Pulse rhythm:   regular BP sitting:   128 / 78  (left arm)  Vitals Entered By: Milford Cage NCMA (February 19, 2010 10:15 AM)  Physical Exam  General:  Well developed, well nourished, no acute distress. Head:  Normocephalic and atraumatic. Eyes:  PERRLA, no icterus. Mouth:  No deformity or lesions, dentition normal. Lungs:  Clear throughout to auscultation. Heart:  Regular rate and rhythm; no murmurs, rubs,  or bruits. Abdomen:  Soft, nontender and nondistended. No masses, hepatosplenomegaly or hernias noted. Normal bowel sounds. Psych:  Alert and cooperative. Normal mood and affect.  Impression & Recommendations:  Problem # 1:  DIVERTICULITIS, HX OF (ICD-V12.79) Resolved diverticulitis. Discontinue low-residue diet and resume a high-fiber diet.  Problem # 2:  CONSTIPATION (ICD-564.00) See problem #1.  Problem # 3:  COLONIC POLYPS, HX OF (ICD-V12.72) Surveillance colonoscopy recommended January 2016.  Patient Instructions: 1)  High Fiber, Low Fat  Healthy Eating Plan brochure given.  2)  Please schedule a follow-up appointment as needed.  3)  The medication list was reviewed and reconciled.  All changed / newly prescribed medications were explained.  A complete medication list was provided to the patient / caregiver.

## 2010-04-11 NOTE — Progress Notes (Signed)
Summary: REFILL  Phone Note Call from Patient Call back at Home Phone [phone]   Caller: Patient Reason for Call: Talk to Nurse Summary of Call: NEEDS REFILL ON 3 ML SYRINGE 25 GUAGE 1/2 NEEDLE use with b-12 monthy injections, PLEASE CALL INTO [facility] [address] Initial call taken by: Migdalia Dk,  March 21, 2010 10:36 AM    Prescriptions: 3 ML SYRINGE 25 GUAGE 1/2 NEEDLE use with b-12 monthy injections  #6 x 1   Entered by:   Margaret Pyle, CMA   Authorized by:   Jacques Navy MD   Signed by:   Margaret Pyle, CMA on 03/21/2010   Method used:   Faxed to ...       [facility]  [address]  [phone]* (retail)       [address]       [place], [place]  [address]       Ph: [phone] or [phone]       Fax: [phone]   RxID:   [id number]

## 2010-04-17 NOTE — Letter (Signed)
Summary: Su Grand MD/[facility]  Su Grand MD/[facility]   Imported By: Lester [place] 04/12/2010 10:10:43  _____________________________________________________________________  External Attachment:    Type:   Image     Comment:   External Document

## 2010-05-13 ENCOUNTER — Telehealth: Payer: Self-pay | Admitting: Internal Medicine

## 2010-05-21 NOTE — Progress Notes (Signed)
Summary: BP MED  Phone Note Call from Patient Call back at [phone]   Summary of Call: Pt is taking both bp meds, he c/o continued elevation in BP. In the am has been 160's over mid 80's. Normal per pt in the rest of the day. Pt needs 90 refills of both meds but wants to know if MD thinks he should change meds?  Initial call taken by: Lamar Sprinkles, CMA,  May 13, 2010 9:43 AM  Follow-up for Phone Call        ok for refill. If BP continues to be high will need OV for possible 3rd agent Follow-up by: Jacques Navy MD,  May 13, 2010 2:48 PM  Additional Follow-up for Phone Call Additional follow up Details #1::        Pt informed  Additional Follow-up by: Lamar Sprinkles, CMA,  May 13, 2010 3:47 PM    Prescriptions: DILTIAZEM HCL COATED BEADS 240 MG XR24H-CAP (DILTIAZEM HCL COATED BEADS) 1 by mouth once daily  #90 x 1   Entered by:   Lamar Sprinkles, CMA   Authorized by:   Jacques Navy MD   Signed by:   Lamar Sprinkles, CMA on 05/13/2010   Method used:   Electronically to        [facility]  [phone]* (retail)       [address]       [place], [place]  [address]       Ph: [phone] or [phone]       Fax: [phone]   RxID:   [id number] FUROSEMIDE 40 MG TABS (FUROSEMIDE) 1 by mouth once daily  #90 x 1   Entered by:   Lamar Sprinkles, CMA   Authorized by:   Jacques Navy MD   Signed by:   Lamar Sprinkles, CMA on 05/13/2010   Method used:   Electronically to        [facility]  [phone]* (retail)       [address]       [place], [place]  [address]       Ph: [phone] or [phone]       Fax: [phone]   RxID:   [id number]

## 2010-07-23 NOTE — Op Note (Signed)
NAMEAMARI, Jose Ware            ACCOUNT NO.:  [id number]   MEDICAL RECORD NO.:  [id number]          PATIENT TYPE:  AMB   LOCATION:  [facility]                         FACILITY:  [facility]   PHYSICIAN:  Lindaann Slough, M.D.  DATE OF BIRTH:  1935/01/21   DATE OF PROCEDURE:  11/26/2007  DATE OF DISCHARGE:  11/26/2007                               OPERATIVE REPORT   PREOPERATIVE DIAGNOSIS:  Adenocarcinoma of prostate.   POSTOPERATIVE DIAGNOSIS:  Adenocarcinoma of prostate.   PROCEDURE:  I-125 seeds implantation and cystoscopy.   SURGEON:  Danae Chen, M.D.   ANESTHESIA:  General.   INDICATIONS:  The patient is a 76 year old male with stage T1 C  adenocarcinoma of prostate, Gleason score 3+4, PSA 7.7.  Treatment  options were discussed with the patient and he chose to have radiation  therapy.  He was advised by Dr. Kathrynn Running, to have combination external  beam and brachytherapy.  He has already received the external beam  therapy.  He is scheduled now for seeds implantation.   The patient was identified by his wristband.  Proper time-out was taken.  Under general anesthesia he was prepped and draped and placed in the  dorsal lithotomy position.  A rectal tube was placed in the rectum.  A  Foley catheter was inserted in the bladder.  The transducer was inserted  in the rectum and two stabilizing needles were placed in the prostate  and ultrasound planning was done by Dr. Kathrynn Running.  When planning was  completed, under ultrasound guidance and using the Nucletron, a total of  89 seeds were implanted in the prostate using 24 needles.  The total  apparent activity is 37.2910 mCi.  Under fluoroscopy, there appears to  be good seeds distribution.  Then the Foley catheter was removed.  The  stabilizing needles were removed.  A flexible cystoscope was inserted in  the bladder.  The urethra is normal.  He has trilobar prostatic  hypertrophy.  The bladder is moderately trabeculated.  There is no stone  or tumor in the bladder.  The ureteral orifices are in normal position  and shape with clear efflux.  There is no  evidence of seeds, stone or tumor in the bladder.  The cystoscope was  then removed.  A #16 Foley catheter was then reinserted in the bladder.   The patient tolerated the procedure well and left the OR in satisfactory  condition to post anesthesia care unit.      Lindaann Slough, M.D.  Electronically Signed     MN/MEDQ  D:  11/26/2007  T:  11/29/2007  Job:  [id number]   cc:   Artist Pais Kathrynn Running, M.D.  Fax: [phone]   Rosalyn Gess. Norins, MD  [address]  [place]  [place] [address]

## 2010-08-13 ENCOUNTER — Encounter: Payer: Self-pay | Admitting: Gastroenterology

## 2010-09-12 ENCOUNTER — Telehealth: Payer: Self-pay | Admitting: *Deleted

## 2010-09-12 DIAGNOSIS — G609 Hereditary and idiopathic neuropathy, unspecified: Secondary | ICD-10-CM

## 2010-09-12 NOTE — Telephone Encounter (Signed)
Patient informed. 

## 2010-09-12 NOTE — Telephone Encounter (Signed)
Last B12 level was October '11. OK for follow-up lab: B12 order entered

## 2010-09-12 NOTE — Telephone Encounter (Signed)
Wife called - pt has been getting b-12 injections. She would like to know if pt needs b-12 labs?

## 2010-09-13 ENCOUNTER — Encounter: Payer: Self-pay | Admitting: Internal Medicine

## 2010-09-13 ENCOUNTER — Other Ambulatory Visit (INDEPENDENT_AMBULATORY_CARE_PROVIDER_SITE_OTHER): Payer: Medicare Other

## 2010-09-13 DIAGNOSIS — G609 Hereditary and idiopathic neuropathy, unspecified: Secondary | ICD-10-CM

## 2010-09-15 ENCOUNTER — Encounter: Payer: Self-pay | Admitting: Internal Medicine

## 2010-09-16 ENCOUNTER — Encounter: Payer: Self-pay | Admitting: Internal Medicine

## 2010-09-16 ENCOUNTER — Ambulatory Visit (INDEPENDENT_AMBULATORY_CARE_PROVIDER_SITE_OTHER): Payer: Medicare Other | Admitting: Internal Medicine

## 2010-09-16 DIAGNOSIS — E538 Deficiency of other specified B group vitamins: Secondary | ICD-10-CM

## 2010-09-16 NOTE — Progress Notes (Signed)
  Subjective:    Patient ID: Jose Ware, male    DOB: 01-Dec-1934, 76 y.o.   MRN: [id number]  HPI Jose Ware presents to discuss B12. He has been on replacement injections since October. Last lab with B12 352, in normal range.  His chief complaint is weakness: he can't stand for prolonged periods of time - gets pain and stiffness in the knees, then becomes shaky. He is able to complete his normal activities, it just takes longer.   PMH, FamHx and SocHx reviewed for any changes and relevance.    Review of Systems Review of Systems  Constitutional:  Negative for fever, chills, activity change and unexpected weight change.  HEENT:  Negative for hearing loss, ear pain, congestion, neck stiffness and postnasal drip. Negative for sore throat or swallowing problems. Negative for dental complaints.   Eyes: Negative for vision loss or change in visual acuity.  Respiratory: Negative for chest tightness and wheezing.   Cardiovascular: Negative for chest pain and palpitation. No decreased exercise tolerance Gastrointestinal: No change in bowel habit. No bloating or gas. No reflux or indigestion Genitourinary: Negative for urgency, frequency, flank pain and difficulty urinating.  Musculoskeletal: Negative for myalgias, back pain, arthralgias and gait problem.  Neurological: Negative for dizziness, tremors, weakness and headaches.  Hematological: Negative for adenopathy.  Psychiatric/Behavioral: Negative for behavioral problems and dysphoric mood.       Objective:   Physical Exam Vitals reveiwed - ok Gen'l- heavy-set white man in no distress Pulmonary- normal respirations Cor - regular rate Neuro - non-focal       Assessment & Plan:

## 2010-09-17 DIAGNOSIS — E538 Deficiency of other specified B group vitamins: Secondary | ICD-10-CM | POA: Insufficient documentation

## 2010-09-17 NOTE — Assessment & Plan Note (Signed)
Patient on replacement and B12 is in low normal range.  Plan - continue monthly B12 shots

## 2010-09-24 ENCOUNTER — Ambulatory Visit (INDEPENDENT_AMBULATORY_CARE_PROVIDER_SITE_OTHER): Payer: Medicare Other | Admitting: Internal Medicine

## 2010-09-24 ENCOUNTER — Encounter: Payer: Self-pay | Admitting: Internal Medicine

## 2010-09-24 DIAGNOSIS — L02419 Cutaneous abscess of limb, unspecified: Secondary | ICD-10-CM

## 2010-09-24 DIAGNOSIS — R2230 Localized swelling, mass and lump, unspecified upper limb: Secondary | ICD-10-CM

## 2010-09-24 DIAGNOSIS — IMO0002 Reserved for concepts with insufficient information to code with codable children: Secondary | ICD-10-CM

## 2010-09-24 DIAGNOSIS — R229 Localized swelling, mass and lump, unspecified: Secondary | ICD-10-CM

## 2010-09-24 MED ORDER — DOXYCYCLINE HYCLATE 100 MG PO TABS: 100.0000 mg | ORAL_TABLET | Freq: Two times a day (BID) | ORAL | Status: AC

## 2010-09-24 MED ORDER — MUPIROCIN 2 % EX OINT: TOPICAL_OINTMENT | CUTANEOUS | Status: AC

## 2010-09-24 MED ORDER — HYDROCODONE-ACETAMINOPHEN 7.5-500 MG PO TABS: 1.0000 | ORAL_TABLET | ORAL | Status: AC | PRN

## 2010-09-24 NOTE — Progress Notes (Signed)
  Subjective:    Patient ID: Jose Ware, male    DOB: Jan 08, 1935, 76 y.o.   MRN: [id number]  HPI  C/o painful growth in R axilla x 1 wk; painful and getting worse. He is worried about cancer. No chills; no fever.  Review of Systems  Constitutional: Negative for fever, appetite change, fatigue and unexpected weight change.  HENT: Negative for nosebleeds, congestion, sore throat, sneezing, trouble swallowing and neck pain.   Eyes: Negative for itching and visual disturbance.  Respiratory: Negative for cough.   Cardiovascular: Negative for chest pain, palpitations and leg swelling.  Gastrointestinal: Negative for nausea, diarrhea, blood in stool and abdominal distention.  Genitourinary: Negative for frequency and hematuria.  Musculoskeletal: Positive for arthralgias. Negative for back pain, joint swelling and gait problem.  Skin: Negative for rash and wound.  Neurological: Negative for dizziness, tremors, speech difficulty and weakness.  Hematological: Negative for adenopathy. Bruises/bleeds easily.  Psychiatric/Behavioral: Negative for sleep disturbance, dysphoric mood and agitation. The patient is not nervous/anxious.        Objective:   Physical Exam  Constitutional: He is oriented to person, place, and time. He appears well-developed.  HENT:  Mouth/Throat: Oropharynx is clear and moist.  Eyes: Conjunctivae are normal. Pupils are equal, round, and reactive to light.  Neck: Normal range of motion. No JVD present. No thyromegaly present.  Cardiovascular: Normal rate, regular rhythm, normal heart sounds and intact distal pulses.  Exam reveals no gallop and no friction rub.   No murmur heard. Pulmonary/Chest: Effort normal and breath sounds normal. No respiratory distress. He has no wheezes. He has no rales. He exhibits no tenderness.  Abdominal: Soft. Bowel sounds are normal. He exhibits no distension and no mass. There is no tenderness. There is no rebound and no guarding.    Musculoskeletal: Normal range of motion. He exhibits no edema and no tenderness.  Lymphadenopathy:    He has no cervical adenopathy.  Neurological: He is alert and oriented to person, place, and time. He has normal reflexes. No cranial nerve deficit. He exhibits normal muscle tone. Coordination normal.  Skin: Skin is warm and dry. No rash noted. There is erythema.       A 2.0 x 1.5 cm node with erythema in R axilla, tender  Psychiatric: He has a normal mood and affect. His behavior is normal. Judgment and thought content normal.       Procedure Note :    Procedure :   Sonography examination   Indication: R axilla mass   Equipment used: Sonosite M-Turbo with HFL38x/13-6 MHz transducer linear probe. The images were stored in the unit and later transferred in storage.  The patient was placed in a decubitus position.  This study revealed a heteroechotic cavity  in R axilla.   Impression: US findings c/w skin abscess      Assessment & Plan:

## 2010-09-24 NOTE — Assessment & Plan Note (Signed)
Procedure note:  Incision and Drainage of an Abscess   Indication : R axilla localized collection of pus that is tender and not spontaneously resolving.    Risks including unsuccessful procedure , possible need for a repeat procedure due to pus accumulation, scar formation, and others as well as benefits were explained to the patient in detail. Written consent was obtained/signed.    The patient was placed in a decubitus position. The area of an abscess was prepped with povidone-iodine and draped in a sterile fashion. Local anesthesia with   2    cc of 2% lidocaine and epinephrine  was administered.  1 cm incision with #11strait blade was made. About 2 cc of purulent material was expressed. The abscess cavity was explored with a sterile hemostat and the walled- off pockets and septae were broken down bluntly. The cavity was irrigated with the rest of the anesthetic in the syringe and packed with 3 inches of  the iodoform gauze.   The wound was dressed with antibiotic ointment and Telfa pad.  Tolerated well. Complications: None.   Wound instructions provided.   Wound instructions : change dressing once a day or twice a day is needed. Change dressing after  shower in the morning.  Pat dry the wound with gauze. Pull out one inch of packing everyday and cut it off. Re-dress wound with antibiotic ointment and Telfa pad or a Band-Aid of appropriate size.   Please contact us if you notice a recollection of pus in the abscess fever and chills increased pain redness red streaks near the abscess increased swelling in the area.

## 2010-09-24 NOTE — Patient Instructions (Signed)
   Wound instructions : change dressing once a day or twice a day is needed. Change dressing after  shower in the morning.  Pat dry the wound with gauze. Pull out one inch of packing everyday and cut it off. Re-dress wound with antibiotic ointment and Telfa pad or a Band-Aid of appropriate size.   Please contact us if you notice a recollection of pus in the abscess fever and chills increased pain redness red streaks near the abscess increased swelling in the area.  

## 2010-11-01 ENCOUNTER — Ambulatory Visit (INDEPENDENT_AMBULATORY_CARE_PROVIDER_SITE_OTHER): Payer: Medicare Other | Admitting: Internal Medicine

## 2010-11-01 ENCOUNTER — Encounter: Payer: Self-pay | Admitting: Internal Medicine

## 2010-11-01 VITALS — BP 142/70 | HR 55 | Temp 98.6°F | Ht 68.0 in | Wt 224.0 lb

## 2010-11-01 DIAGNOSIS — G238 Other specified degenerative diseases of basal ganglia: Secondary | ICD-10-CM

## 2010-11-01 DIAGNOSIS — G903 Multi-system degeneration of the autonomic nervous system: Secondary | ICD-10-CM

## 2010-11-01 DIAGNOSIS — E538 Deficiency of other specified B group vitamins: Secondary | ICD-10-CM

## 2010-11-01 DIAGNOSIS — M6281 Muscle weakness (generalized): Secondary | ICD-10-CM

## 2010-11-01 DIAGNOSIS — R29898 Other symptoms and signs involving the musculoskeletal system: Secondary | ICD-10-CM

## 2010-11-01 MED ORDER — CYANOCOBALAMIN 1000 MCG/ML IJ SOLN
1000.0000 ug | Freq: Once | INTRAMUSCULAR | Status: AC
  Administered 2010-11-01: 1000 ug via INTRAMUSCULAR

## 2010-11-01 NOTE — Progress Notes (Signed)
  Subjective:    Patient ID: Jose Ware, male    DOB: October 05, 1934, 76 y.o.   MRN: [id number]  HPI Jose Ware presents for evaluation of weakness in his legs and light headedness. This coincides with initiation of furosemide and diltiazem. Light-headedness is very positional with a duration of 15-20 seconds to minutes. The weakness in his legs comes with prolonged standing. He has not noted his heart rate. He has not had any paresthesia or weakness at other times. The sensation does correspond to the initiation of CCB for blood pressure control.   I have reviewed the patient's medical history in detail and updated the computerized patient record.    Review of Systems System review is negative for any constitutional, cardiac, pulmonary, GI  symptoms or complaints     Objective:   Physical Exam Vitals: lying 150/80 HR 60; Standing 130/80 HR 60  Gen' - WNWD white male in no distress Neuro- alert and oriented x 3, no focal findings on exam including no focal weakness.  Cor - Regular bradycardia with no change in rate when moving from supine to standing although there was a drop in BP     Assessment & Plan:  1. Orthostatic hypotension.  Plan - "rule of 20" with position change.  2. Weakness with standing - etiology not clear. With CCb question whether there is relationship with bradycardia and symptoms.  Plan - hold CCB and see if symptoms resolve

## 2010-11-01 NOTE — Patient Instructions (Signed)
Orthostatic hypotension - this is a drop in blood pressure to the brain with position change. No medication is needed but you should follow the rule of 20 - count to 20 when you go from sitting to standing to avoid that whoozy feeling and prevent falls.   The weakness with standing is harder to explain - it may be a electrical conduction issue related to the diltiazem (cardiazem) slowing conduction via the AV node. Plan - stop the cardiazem. Call me on Monday to let me know if stopping medication made a difference.   Benign Positional Vertigo (BPV) Vertigo is a feeling that you are unsteady or that you or your surroundings are moving. Benign positional vertigo (BPV) is the most common form of vertigo. Benign means it does not have a serious cause. It is an upset in the balance system in your middle ear. This is troublesome but usually not serious. A viral infection or head injury are common causes, but often no cause is found. It is more common as we grow older. SYMPTOMS Sudden dizziness happens when you move your head in different directions. Some of the problems that come with this are:  Loss of balance   Throwing up     Blurred vision     Dizziness     Feeling sick to your stomach     DIAGNOSIS Your caregiver may do some specialized testing to prove what is wrong. HOME CARE INSTRUCTIONS  Rest and eat a well balanced diet.   Move slowly and do not make sudden body or head movements.   Do not drive a car or do any activities that could hurt you or others.   Lie down and rest. Take precautions to prevent falls.  SEEK IMMEDIATE MEDICAL CARE IF:  You develop headaches which are severe or lasting.   You develop continued vomiting.   A temporary loss or change of vision appears.   You notice temporary numbness on one side of your body.   You are temporarily unable to speak.   Temporary areas of weakness develop.   You have weakness or numbness in the face, arms, or legs.    You notice dizziness or difficulty walking.   You experience slurred speech or difficulty swallowing.  MAKE SURE YOU:    Understand these instructions.   Will watch your condition.   Will get help right away if you are not doing well or get worse.  Document Released: 12/02/2005 Document Re-Released: 06/12/2008 [facility] Patient Information 2011 [place], [place].

## 2010-11-04 ENCOUNTER — Telehealth: Payer: Self-pay | Admitting: *Deleted

## 2010-11-04 NOTE — Telephone Encounter (Signed)
Pt left vm stating that he was return call to Dr Debby Bud. Did you call patient?

## 2010-11-04 NOTE — Telephone Encounter (Signed)
Patient was to call and leave a msg as to whether his symptoms were better of CCB (diltiazem). Called back and asked him to leave a msg tomorrow as to his symptoms. If he is not better -restart the diltiazem. If he is better will change BP med to an ARB

## 2010-11-05 NOTE — Telephone Encounter (Signed)
Ok to resume diltiazem. For the next week hold the furosemide - call Monday with report as to symptoms. Thanks

## 2010-11-05 NOTE — Telephone Encounter (Signed)
Spoke w/ pt, he is no better after stopping med so he will restart, now what?

## 2010-11-05 NOTE — Telephone Encounter (Signed)
Patient informed. 

## 2010-11-13 ENCOUNTER — Telehealth: Payer: Self-pay

## 2010-11-13 NOTE — Telephone Encounter (Signed)
Pt advised and will monitor BP and call back in 1 week with readings, sooner if needed.

## 2010-11-13 NOTE — Telephone Encounter (Signed)
Ok to d/c furosemide. Need to follow BP

## 2010-11-13 NOTE — Telephone Encounter (Signed)
Pt called stating symptoms of dizziness has improved since he has been off of Furosemide.

## 2010-11-15 ENCOUNTER — Telehealth: Payer: Self-pay | Admitting: *Deleted

## 2010-11-15 MED ORDER — OLMESARTAN MEDOXOMIL 20 MG PO TABS: 20.0000 mg | ORAL_TABLET | Freq: Every day | ORAL | Status: DC

## 2010-11-15 NOTE — Telephone Encounter (Signed)
Patient called with update. He stopped furosemide and says he can now stand "with more confidence", symptoms are much better but not completely gone. He is worried about BP. It has been 170's - 180's over 90's in the am and the afternoon it is "back down" to normal. Wife wanted to know if he should restart furosemide and 1/2 the dose he was on? Please advise.

## 2010-11-15 NOTE — Telephone Encounter (Signed)
Patient informed, med list updated.

## 2010-11-15 NOTE — Telephone Encounter (Signed)
Diuretic seems to have been a cause of his symptoms. Would not restart furosemide. Plan - trial of adding an ARB - may have sample of benicar 20 mg to take once a day.

## 2010-11-16 ENCOUNTER — Other Ambulatory Visit: Payer: Self-pay | Admitting: Internal Medicine

## 2010-11-25 ENCOUNTER — Telehealth: Payer: Self-pay | Admitting: *Deleted

## 2010-11-25 DIAGNOSIS — I10 Essential (primary) hypertension: Secondary | ICD-10-CM

## 2010-11-25 MED ORDER — OLMESARTAN MEDOXOMIL-HCTZ 40-12.5 MG PO TABS: 1.0000 | ORAL_TABLET | Freq: Every day | ORAL | Status: DC

## 2010-11-25 NOTE — Telephone Encounter (Signed)
Informed pts wife

## 2010-11-25 NOTE — Telephone Encounter (Signed)
Pt was given a sample of Benicar 20 mg on 11/15/2010 and was told to try this medication for his BP. Pt states he has been on medication for about 2 weeks. He states his symptoms of dizziness have subsided but his systolic is still running in the 170 range. What do you advise for pt

## 2010-11-25 NOTE — Telephone Encounter (Signed)
Stop plain benicar and start benicar/hct, an Rx was sent to his pharmacy

## 2010-12-04 ENCOUNTER — Ambulatory Visit: Payer: Medicare Other | Admitting: Internal Medicine

## 2010-12-06 ENCOUNTER — Ambulatory Visit (INDEPENDENT_AMBULATORY_CARE_PROVIDER_SITE_OTHER): Payer: Medicare Other | Admitting: Internal Medicine

## 2010-12-06 DIAGNOSIS — E538 Deficiency of other specified B group vitamins: Secondary | ICD-10-CM

## 2010-12-06 DIAGNOSIS — I10 Essential (primary) hypertension: Secondary | ICD-10-CM

## 2010-12-06 MED ORDER — LOSARTAN POTASSIUM-HCTZ 100-25 MG PO TABS: 1.0000 | ORAL_TABLET | Freq: Every day | ORAL | Status: DC

## 2010-12-06 NOTE — Assessment & Plan Note (Signed)
Patient has resolution of symptoms with cessation of furosemide but BP poorly controlled.  Plan - substitute generic losartan 100 mg for benicar 40             Increase HCTZ component from 12.5 mg to 25 mg           Monitor BP at home and call if SBP not consistently less than 140           Life-style management with weight loss, diet - provided table re: benefits of life-style management and DASH diet  (Patient seen by Adalberto Ill, MSIV)

## 2010-12-06 NOTE — Progress Notes (Signed)
  Subjective:    Patient ID: Jose Ware, male    DOB: [birth year], 76 y.o.   MRN: [id number]  HPI Mr. Pottenger presents for follow up: he had at his last visit be having dizziness, light-headed symptoms and LE swelling and discomfort. Over the interval he stopped the CCB but had persistent symptoms; then stoppd the furosemide and his symptoms improved.  His BP has been elevated with SBP's in the 150-160 range, but he has been asymptomatic.  I have reviewed the patient's medical history in detail and updated the computerized patient record.    Review of Systems System review is negative for any constitutional, cardiac, pulmonary, GI or neuro symptoms or complaints other than as described in the HPI.     Objective:   Physical Exam Vitals reviewed - BP mildly elevated Gen'l- heavy-set white male in no distress Pulm - normal respirations Cor - 2+ Radial pulse, RRR        Assessment & Plan:

## 2010-12-06 NOTE — Patient Instructions (Signed)
Very glad that you are filling better.  Blood pressure - suboptimal control. Plan - switch to a generic product in the same class with an increased amount of diuretic - losartan/hct 100/25 (hyzaar) once a day. Continue the cardiazem. Monitor your BP at home and let me know if the top number is consistently over 140. Follow the advice about the DASH diet, etc.   Come back for follow-up in 4-6 weeks, sooner if needed.

## 2010-12-08 NOTE — Assessment & Plan Note (Signed)
No symptoms. B12 injection today

## 2010-12-11 LAB — COMPREHENSIVE METABOLIC PANEL
ALT: 19
AST: 18
Alkaline Phosphatase: 78
CO2: 31
GFR calc Af Amer: >60
Glucose, Bld: 108 — ABNORMAL HIGH
Potassium: 3.8
Sodium: 140
Total Protein: 5.8 — ABNORMAL LOW

## 2010-12-11 LAB — CBC
Hemoglobin: 14.6
RBC: 4.51
RDW: 15.3

## 2010-12-11 LAB — PROTIME-INR: Prothrombin Time: 13.3

## 2010-12-30 ENCOUNTER — Other Ambulatory Visit: Payer: Self-pay

## 2010-12-31 ENCOUNTER — Encounter: Payer: Self-pay | Admitting: Internal Medicine

## 2010-12-31 ENCOUNTER — Other Ambulatory Visit (INDEPENDENT_AMBULATORY_CARE_PROVIDER_SITE_OTHER): Payer: Medicare Other

## 2010-12-31 ENCOUNTER — Ambulatory Visit (INDEPENDENT_AMBULATORY_CARE_PROVIDER_SITE_OTHER): Payer: Medicare Other | Admitting: Internal Medicine

## 2010-12-31 DIAGNOSIS — G609 Hereditary and idiopathic neuropathy, unspecified: Secondary | ICD-10-CM

## 2010-12-31 DIAGNOSIS — Z0000 Encounter for general adult medical examination without abnormal findings: Secondary | ICD-10-CM | POA: Insufficient documentation

## 2010-12-31 DIAGNOSIS — I10 Essential (primary) hypertension: Secondary | ICD-10-CM

## 2010-12-31 DIAGNOSIS — M1990 Unspecified osteoarthritis, unspecified site: Secondary | ICD-10-CM

## 2010-12-31 DIAGNOSIS — E538 Deficiency of other specified B group vitamins: Secondary | ICD-10-CM

## 2010-12-31 DIAGNOSIS — C61 Malignant neoplasm of prostate: Secondary | ICD-10-CM

## 2010-12-31 LAB — COMPREHENSIVE METABOLIC PANEL
ALT: 12 U/L (ref 0–53)
AST: 15 U/L (ref 0–37)
Alkaline Phosphatase: 103 U/L (ref 39–117)
BUN: 24 mg/dL — ABNORMAL HIGH (ref 6–23)
Calcium: 10 mg/dL (ref 8.4–10.5)
Chloride: 104 mEq/L (ref 96–112)
Creatinine, Ser: 1 mg/dL (ref 0.4–1.5)
Potassium: 3.6 mEq/L (ref 3.5–5.1)

## 2010-12-31 NOTE — Assessment & Plan Note (Signed)
Stable and doing well. Now on annual surveillance per Dr. Brunilda Payor

## 2010-12-31 NOTE — Progress Notes (Signed)
Subjective:    Patient ID: Jose Ware, male    DOB: 1934-10-27, 76 y.o.   MRN: [id number]  HPI   The patient is here for annual Medicare wellness examination and management of other chronic and acute problems. He has no complaints and is feeling well.    The risk factors are reflected in the social history.  The roster of all physicians providing medical care to patient - is listed in the Snapshot section of the chart.  Activities of daily living:  The patient is 100% inedpendent in all ADLs: dressing, toileting, feeding as well as independent mobility  Home safety : The patient has smoke detectors in the home. They wear seatbelts. No firearms at home   There is no risks for hepatitis, STDs or HIV. There is no   history of blood transfusion. They have no travel history to infectious disease endemic areas of the world.  The patient has seen their dentist in the last six month. They have seen their eye doctor in the last year. They deny any hearing difficulty and have not had audiologic testing in the last year.  They do not  have excessive sun exposure. Discussed the need for sun protection: hats, long sleeves and use of sunscreen if there is significant sun exposure.   Diet: the importance of a healthy diet is discussed. They do have a healthy diet.  The patient has a regular exercise program: walking , 30 minute duration, 7 days per week.  The benefits of regular aerobic exercise were discussed.  Depression screen: there are no signs or vegative symptoms of depression- irritability, change in appetite, anhedonia, sadness/tearfullness.  Cognitive assessment: the patient manages all their financial and personal affairs and is actively engaged. They could relate day,date,year and events; recalled 1/3 objects at 3 minutes; performed clock-face test normally.  The following portions of the patient's history were reviewed and updated as appropriate: allergies, current medications,  past family history, past medical history,  past surgical history, past social history  and problem list.  Vision, hearing, body mass index were assessed and reviewed.   During the course of the visit the patient was educated and counseled about appropriate screening and preventive services including : fall prevention , diabetes screening, nutrition counseling, colorectal cancer screening, and recommended immunizations.  Past Medical History  Diagnosis Date  . Adenocarcinoma of prostate     XRT & Radiation see implantation in 2010  . Peripheral neuropathy   . Psoriasis   . Hernia   . DJD (degenerative joint disease)   . Strep throat   . Tenosynovitis   . Nephrolithiasis   . Diverticulitis   . Adenomatous colon polyp    Past Surgical History  Procedure Date  . Back surgery     minor disk surgery: instrumentation placed and removed in a second procedure  . Cholecystectomy 1986  . Reversal of colostomy 1987  . Wrist surgery     Remote complicated w/infection  . Colostomy 1985    After colectomy for diverticulitis  . Knee arthroscopy 2003    Left  . Appendectomy 1985  . Tonsillectomy     As a child  . Knee surgery 1956    Right w/cartilage removed  . Cataract extraction, bilateral    Family History  Problem Relation Age of Onset  . Coronary artery disease Mother   . Alcohol abuse Father   . Cirrhosis Father   . Heart failure Sister   . Breast cancer Sister  W/involvement of right arm leading to amputation  . Coronary artery disease Brother   . Heart attack Brother   . Prostate cancer Neg Hx   . Colon cancer Neg Hx   . Diabetes Neg Hx   . Glaucoma Neg Hx   . Clotting disorder Brother    History   Social History  . Marital Status: Married    Spouse Name: N/A    Number of Children: N/A  . Years of Education: N/A   Occupational History  . Not on file.   Social History Main Topics  . Smoking status: Former Smoker    Quit date: 03/10/1981  . Smokeless  tobacco: Not on file  . Alcohol Use: Yes     RARE  . Drug Use: No  . Sexually Active: Not on file   Other Topics Concern  . Not on file   Social History Narrative   [facility] [place], [place].Married 71 - Divorded 1996; remarried '033 sons - '63, '65, '67Grandchildren -14; 1 great grand daughterDaily Caffeine Use:  3-4 cups daily      Review of Systems Constitutional:  Negative for fever, chills, activity change and unexpected weight change.  HEENT:  Negative for hearing loss, ear pain, congestion, neck stiffness and postnasal drip. Negative for sore throat or swallowing problems. Negative for dental complaints.   Eyes: Negative for vision loss or change in visual acuity.  Respiratory: Negative for chest tightness and wheezing. Negative for DOE.   Cardiovascular: Negative for chest pain or palpitations. No decreased exercise tolerance Gastrointestinal: No change in bowel habit. Minor bloating or gas. No reflux or indigestion. Responds to TUMS Genitourinary: Negative for urgency, frequency, flank pain and difficulty urinating. Nocturia Musculoskeletal: Negative for myalgias, back pain, and gait problem. Arthralgia right wrist and knees. Decreased flexion of the right wrist.  Neurological: Negative for dizziness, tremors, weakness and headaches.  Hematological: Negative for adenopathy.  Psychiatric/Behavioral: Negative for behavioral problems and dysphoric mood.       Objective:   Physical Exam Vitals noted - normal Gen'l - WNWD white man in no distress HEENT - [place]/AT, TMs normal, oropharynx with upper partial, native dentition in good repair. No buccal lesions posterior pharynx clear Neck - supple, no thyromegaly Nodes - negative cervical, supraclavicular region Chest - no deformity Pulm - normal respirations, Cor - 2+ radial and DP pulses, no JVD, no carotid bruits, RRR w/o murmur Abdomen - BS+ x 4, no HSM, no guarding or rebound Genitalis deferred to GU Ext - no deformity or  abnormality Neuro - CN II-XII normal Skin clear  Lab Results  Component Value Date   WBC 8.3 12/27/2009   HGB 15.2 12/27/2009   HCT 43.9 12/27/2009   PLT 243.0 12/27/2009   GLUCOSE 94 12/31/2010   CHOL 131 12/27/2009   TRIG 72.0 12/27/2009   HDL 33.70* 12/27/2009   LDLCALC 83 12/27/2009   ALT 12 12/31/2010   AST 15 12/31/2010   NA 139 12/31/2010   K 3.6 12/31/2010   CL 104 12/31/2010   CREATININE 1.0 12/31/2010   BUN 24* 12/31/2010   CO2 29 12/31/2010   TSH 2.44 12/27/2009   PSA 0.39 12/14/2008   INR 1.0 11/19/2007          Assessment & Plan:

## 2010-12-31 NOTE — Assessment & Plan Note (Signed)
Minor discomfort that does not limit activities

## 2010-12-31 NOTE — Assessment & Plan Note (Signed)
BP Readings from Last 3 Encounters:  12/31/10 124/62  12/06/10 146/74  11/01/10 142/70   Good control on present medications.  Plan - continue present meds           Routine Bmet

## 2010-12-31 NOTE — Assessment & Plan Note (Signed)
For B12 level today as well as monthly injection.

## 2010-12-31 NOTE — Assessment & Plan Note (Signed)
INterval history is unremarkable. Physical exam, sans prostate, is normal. Last lipid panel in '11 - excellent and at goal. No need to repeat. Bmet pending. Current with colorectal cancer screening. Immunizations up to date except for shingles vaccine.  IN summary - a very nice man who is medically stable. He will continue his good health habits including a sensible weight management diet. He will return as needed or in 1 year.

## 2011-02-04 ENCOUNTER — Ambulatory Visit: Payer: Medicare Other | Admitting: *Deleted

## 2011-02-04 DIAGNOSIS — E538 Deficiency of other specified B group vitamins: Secondary | ICD-10-CM

## 2011-02-04 MED ORDER — CYANOCOBALAMIN 1000 MCG/ML IJ SOLN
1000.0000 ug | Freq: Once | INTRAMUSCULAR | Status: AC
  Administered 2011-02-04 (×2): 1000 ug via INTRAMUSCULAR

## 2011-03-07 ENCOUNTER — Ambulatory Visit (INDEPENDENT_AMBULATORY_CARE_PROVIDER_SITE_OTHER): Payer: Medicare Other

## 2011-03-07 DIAGNOSIS — E538 Deficiency of other specified B group vitamins: Secondary | ICD-10-CM

## 2011-03-07 MED ORDER — CYANOCOBALAMIN 1000 MCG/ML IJ SOLN
1000.0000 ug | Freq: Once | INTRAMUSCULAR | Status: AC
  Administered 2011-03-07: 1000 ug via INTRAMUSCULAR

## 2011-03-11 HISTORY — PX: CATARACT EXTRACTION, BILATERAL: SHX1313

## 2011-04-09 ENCOUNTER — Ambulatory Visit (INDEPENDENT_AMBULATORY_CARE_PROVIDER_SITE_OTHER): Payer: Medicare Other | Admitting: *Deleted

## 2011-04-09 DIAGNOSIS — E538 Deficiency of other specified B group vitamins: Secondary | ICD-10-CM

## 2011-04-10 MED ORDER — CYANOCOBALAMIN 1000 MCG/ML IJ SOLN
1000.0000 ug | Freq: Once | INTRAMUSCULAR | Status: AC
  Administered 2011-04-09: 1000 ug via INTRAMUSCULAR

## 2011-05-08 ENCOUNTER — Telehealth: Payer: Self-pay | Admitting: *Deleted

## 2011-05-08 ENCOUNTER — Ambulatory Visit (INDEPENDENT_AMBULATORY_CARE_PROVIDER_SITE_OTHER): Payer: Medicare Other | Admitting: *Deleted

## 2011-05-08 DIAGNOSIS — E538 Deficiency of other specified B group vitamins: Secondary | ICD-10-CM

## 2011-05-08 MED ORDER — CYANOCOBALAMIN 1000 MCG/ML IJ SOLN
1000.0000 ug | Freq: Once | INTRAMUSCULAR | Status: AC
  Administered 2011-05-08: 1000 ug via INTRAMUSCULAR

## 2011-05-08 NOTE — Telephone Encounter (Signed)
Pt in office for B12 injection, states had physical in October w/labs and was wondering when he would need to have his B12 lab done again. Please advise.

## 2011-06-05 ENCOUNTER — Ambulatory Visit (INDEPENDENT_AMBULATORY_CARE_PROVIDER_SITE_OTHER): Payer: Medicare Other | Admitting: *Deleted

## 2011-06-05 DIAGNOSIS — E538 Deficiency of other specified B group vitamins: Secondary | ICD-10-CM

## 2011-06-05 MED ORDER — CYANOCOBALAMIN 1000 MCG/ML IJ SOLN
1000.0000 ug | Freq: Once | INTRAMUSCULAR | Status: AC
  Administered 2011-06-05: 1000 ug via INTRAMUSCULAR

## 2011-07-07 ENCOUNTER — Ambulatory Visit (INDEPENDENT_AMBULATORY_CARE_PROVIDER_SITE_OTHER): Payer: Medicare Other | Admitting: *Deleted

## 2011-07-07 DIAGNOSIS — E538 Deficiency of other specified B group vitamins: Secondary | ICD-10-CM

## 2011-07-07 MED ORDER — CYANOCOBALAMIN 1000 MCG/ML IJ SOLN
1000.0000 ug | Freq: Once | INTRAMUSCULAR | Status: AC
  Administered 2011-07-07: 1000 ug via INTRAMUSCULAR

## 2011-08-05 ENCOUNTER — Ambulatory Visit (INDEPENDENT_AMBULATORY_CARE_PROVIDER_SITE_OTHER): Payer: Medicare Other | Admitting: *Deleted

## 2011-08-05 DIAGNOSIS — E538 Deficiency of other specified B group vitamins: Secondary | ICD-10-CM

## 2011-08-05 MED ORDER — CYANOCOBALAMIN 1000 MCG/ML IJ SOLN
1000.0000 ug | Freq: Once | INTRAMUSCULAR | Status: AC
  Administered 2011-08-05: 1000 ug via INTRAMUSCULAR

## 2011-08-06 ENCOUNTER — Ambulatory Visit: Payer: Medicare Other

## 2011-09-05 ENCOUNTER — Ambulatory Visit (INDEPENDENT_AMBULATORY_CARE_PROVIDER_SITE_OTHER): Payer: Medicare Other

## 2011-09-05 DIAGNOSIS — E538 Deficiency of other specified B group vitamins: Secondary | ICD-10-CM

## 2011-09-05 MED ORDER — CYANOCOBALAMIN 1000 MCG/ML IJ SOLN
1000.0000 ug | Freq: Once | INTRAMUSCULAR | Status: AC
  Administered 2011-09-05: 1000 ug via INTRAMUSCULAR

## 2011-09-27 ENCOUNTER — Emergency Department (HOSPITAL_COMMUNITY)
Admission: EM | Admit: 2011-09-27 | Discharge: 2011-09-27 | Disposition: A | Discharge status: Home / Self Care | Payer: Medicare Other | Attending: Emergency Medicine | Admitting: Emergency Medicine

## 2011-09-27 ENCOUNTER — Encounter (HOSPITAL_COMMUNITY): Payer: Self-pay | Admitting: Emergency Medicine

## 2011-09-27 DIAGNOSIS — Z8546 Personal history of malignant neoplasm of prostate: Secondary | ICD-10-CM | POA: Insufficient documentation

## 2011-09-27 DIAGNOSIS — R339 Retention of urine, unspecified: Secondary | ICD-10-CM | POA: Insufficient documentation

## 2011-09-27 DIAGNOSIS — N419 Inflammatory disease of prostate, unspecified: Secondary | ICD-10-CM | POA: Insufficient documentation

## 2011-09-27 DIAGNOSIS — Z7982 Long term (current) use of aspirin: Secondary | ICD-10-CM | POA: Insufficient documentation

## 2011-09-27 LAB — URINALYSIS, ROUTINE W REFLEX MICROSCOPIC
Bilirubin Urine: NEGATIVE
Glucose, UA: NEGATIVE mg/dL
Ketones, ur: NEGATIVE mg/dL
Nitrite: NEGATIVE
Specific Gravity, Urine: 1.029 (ref 1.005–1.030)
pH: 5.5 (ref 5.0–8.0)

## 2011-09-27 LAB — URINE MICROSCOPIC-ADD ON

## 2011-09-27 MED ORDER — LEVOFLOXACIN 500 MG PO TABS
500.0000 mg | ORAL_TABLET | Freq: Once | ORAL | Status: AC
  Administered 2011-09-27: 500 mg via ORAL

## 2011-09-27 MED ORDER — CEPHALEXIN 500 MG PO CAPS
500.0000 mg | ORAL_CAPSULE | Freq: Once | ORAL | Status: DC
  Filled 2011-09-27: qty 1

## 2011-09-27 MED ORDER — LEVOFLOXACIN 500 MG PO TABS
500.0000 mg | ORAL_TABLET | Freq: Every day | ORAL | Status: DC
  Filled 2011-09-27: qty 1

## 2011-09-27 MED ORDER — LEVOFLOXACIN 500 MG PO TABS: 500.0000 mg | ORAL_TABLET | Freq: Every day | ORAL | Status: AC

## 2011-09-27 MED ORDER — LEVOFLOXACIN IN D5W 500 MG/100ML IV SOLN
500.0000 mg | INTRAVENOUS | Status: DC
  Filled 2011-09-27: qty 100

## 2011-09-27 NOTE — ED Notes (Addendum)
Pt hx of HTN. Pt took medication this morning, but his BP as of late has been high. Pt thinks medication needs adjusting.

## 2011-09-27 NOTE — ED Notes (Signed)
Attempted to insert foley.  RN is getting caude.

## 2011-09-27 NOTE — ED Provider Notes (Addendum)
History     CSN: [id number]  Arrival date & time 09/27/11  1753   First MD Initiated Contact with Patient 09/27/11 2003      Chief Complaint  Patient presents with  . Urinary Retention    (Consider location/radiation/quality/duration/timing/severity/associated sxs/prior treatment) HPI  Patient with burning with urination at 4 PM and then has been unable to void since. He continues to feel like he needs to void. Prior to my evaluation he was able to void 250 cc but continues to feel like he needs to void. Nursing placed Foley catheter  Past Medical History  Diagnosis Date  . Adenocarcinoma of prostate     XRT & Radiation see implantation in 2010  . Peripheral neuropathy   . Psoriasis   . Hernia   . DJD (degenerative joint disease)   . Strep throat   . Tenosynovitis   . Nephrolithiasis   . Diverticulitis   . Adenomatous colon polyp     Past Surgical History  Procedure Date  . Back surgery     minor disk surgery: instrumentation placed and removed in a second procedure  . Cholecystectomy 1986  . Reversal of colostomy 1987  . Wrist surgery     Remote complicated w/infection  . Colostomy 1985    After colectomy for diverticulitis  . Knee arthroscopy 2003    Left  . Appendectomy 1985  . Tonsillectomy     As a child  . Knee surgery 1956    Right w/cartilage removed  . Cataract extraction, bilateral     Family History  Problem Relation Age of Onset  . Coronary artery disease Mother   . Alcohol abuse Father   . Cirrhosis Father   . Heart failure Sister   . Breast cancer Sister     W/involvement of right arm leading to amputation  . Coronary artery disease Brother   . Heart attack Brother   . Prostate cancer Neg Hx   . Colon cancer Neg Hx   . Diabetes Neg Hx   . Glaucoma Neg Hx   . Clotting disorder Brother     History  Substance Use Topics  . Smoking status: Former Smoker    Quit date: 03/10/1981  . Smokeless tobacco: Not on file  . Alcohol Use: Yes       RARE      Review of Systems  All other systems reviewed and are negative.    Allergies  Betadine and Povidone  Home Medications   Current Outpatient Rx  Name Route Sig Dispense Refill  . ASPIRIN 81 MG PO TABS Oral Take 81 mg by mouth every other day.     . CYANOCOBALAMIN 1000 MCG/15ML PO LIQD Injection Inject 1 mL as directed every 30 (thirty) days.     Marland Kitchen DILTIAZEM HCL ER COATED BEADS 240 MG PO CP24  TAKE ONE CAPSULE BY MOUTH EVERY DAY 90 capsule 3  . LOSARTAN POTASSIUM-HCTZ 100-25 MG PO TABS Oral Take 1 tablet by mouth daily. 30 tablet 11    BP 170/89  Pulse 51  Temp 98.1 F (36.7 C) (Oral)  Resp 20  Ht 6\' 1"  (1.854 m)  Wt 225 lb (102.059 kg)  BMI 29.69 kg/m2  SpO2 98%  Physical Exam  Nursing note and vitals reviewed. Constitutional: He is oriented to person, place, and time. He appears well-developed and well-nourished.  HENT:  Head: Normocephalic and atraumatic.  Eyes: Conjunctivae and EOM are normal. Pupils are equal, round, and reactive to light.  Neck:  Normal range of motion. Neck supple.  Cardiovascular: Bradycardia present.   Pulmonary/Chest: Effort normal and breath sounds normal.  Abdominal: Soft. Bowel sounds are normal.       Mild suprapubic tenderness  Genitourinary:       Diffuse tenderness over prostate  Musculoskeletal: Normal range of motion.  Neurological: He is alert and oriented to person, place, and time.  Skin: Skin is warm and dry.  Psychiatric: He has a normal mood and affect. His behavior is normal. Thought content normal.    ED Course  Procedures (including critical care time)  Labs Reviewed  URINALYSIS, ROUTINE W REFLEX MICROSCOPIC - Abnormal; Notable for the following:    Hgb urine dipstick SMALL (*)     All other components within normal limits  URINE MICROSCOPIC-ADD ON  URINE CULTURE   No results found.   No diagnosis found.  Results for orders placed during the hospital encounter of 09/27/11  URINALYSIS, ROUTINE W  REFLEX MICROSCOPIC      Component Value Range   Color, Urine YELLOW  YELLOW   APPearance CLEAR  CLEAR   Specific Gravity, Urine 1.029  1.005 - 1.030   pH 5.5  5.0 - 8.0   Glucose, UA NEGATIVE  NEGATIVE mg/dL   Hgb urine dipstick SMALL (*) NEGATIVE   Bilirubin Urine NEGATIVE  NEGATIVE   Ketones, ur NEGATIVE  NEGATIVE mg/dL   Protein, ur NEGATIVE  NEGATIVE mg/dL   Urobilinogen, UA 0.2  0.0 - 1.0 mg/dL   Nitrite NEGATIVE  NEGATIVE   Leukocytes, UA NEGATIVE  NEGATIVE  URINE MICROSCOPIC-ADD ON      Component Value Range   WBC, UA 0-2  <3 WBC/hpf   RBC / HPF 11-20  <3 RBC/hpf     MDM  This is a 77 year old male with a history of prostate cancer and prostate seed implant 2 years ago who comes in today with difficulty voiding, dysuria, and feels like he has been unable to void. Urine has 0-2 white blood cells 11-20 red blood cells. It is being cultured. His prostate is diffusely tender to palpation. He is started on Levaquin here. He will have the Foley catheter remain in place and followup with his urologist next week. Patient as advised and is in agreement with the plan.        Hilario Quarry, MD 09/27/11 2147  Hilario Quarry, MD 09/27/11 [phone]

## 2011-09-27 NOTE — ED Notes (Signed)
Coude placed with moderate difficulty, pt tolerated well.

## 2011-09-27 NOTE — ED Notes (Signed)
Per PT: Urinary retention with only slight dibbling since yesterday at 6pm. Pt has had burning with urination for 3-4 days, and has a hx of prostate cancer. Pt also reports loose stools of small volume x5 today. A&O and ambulatory. Pain is mild when sitting -- NOTE: BETADINE ALLERGY

## 2011-10-05 ENCOUNTER — Emergency Department (HOSPITAL_COMMUNITY)
Admission: EM | Admit: 2011-10-05 | Discharge: 2011-10-06 | Disposition: A | Discharge status: Home / Self Care | Payer: Medicare Other | Attending: Emergency Medicine | Admitting: Emergency Medicine

## 2011-10-05 ENCOUNTER — Encounter (HOSPITAL_COMMUNITY): Payer: Self-pay | Admitting: Emergency Medicine

## 2011-10-05 DIAGNOSIS — Z8546 Personal history of malignant neoplasm of prostate: Secondary | ICD-10-CM | POA: Insufficient documentation

## 2011-10-05 DIAGNOSIS — R319 Hematuria, unspecified: Secondary | ICD-10-CM | POA: Insufficient documentation

## 2011-10-05 DIAGNOSIS — Z87891 Personal history of nicotine dependence: Secondary | ICD-10-CM | POA: Insufficient documentation

## 2011-10-05 DIAGNOSIS — M1990 Unspecified osteoarthritis, unspecified site: Secondary | ICD-10-CM | POA: Insufficient documentation

## 2011-10-05 LAB — URINALYSIS, ROUTINE W REFLEX MICROSCOPIC
Glucose, UA: NEGATIVE mg/dL
Ketones, ur: NEGATIVE mg/dL
Nitrite: NEGATIVE
Specific Gravity, Urine: 1.014 (ref 1.005–1.030)
pH: 5.5 (ref 5.0–8.0)

## 2011-10-05 LAB — URINE MICROSCOPIC-ADD ON

## 2011-10-05 NOTE — ED Notes (Signed)
Pt alert, resp even unlabored, c/o "blood in urine", onset was this evening, pt has f/u Tomorrow for removal of foley catheter

## 2011-10-05 NOTE — ED Provider Notes (Signed)
History     CSN: [id number]  Arrival date & time 10/05/11  2121   First MD Initiated Contact with Patient 10/05/11 2324      Chief Complaint  Patient presents with  . Hematuria    HPI The patient was in the emergency room about a week ago for urinary retention. He states he had difficulty with the placement of the Foley catheter and in fact they had to use a different type of catheter.  Patient followed up with urologist but is scheduled to see him tomorrow for removal.  This evening he noticed blood in his urine. It seems to be decreasing now. He has had some discomfort in the penis with the catheter but otherwise has had no complaints. He has no fever or vomiting. Past Medical History  Diagnosis Date  . Adenocarcinoma of prostate     XRT & Radiation see implantation in 2010  . Peripheral neuropathy   . Psoriasis   . Hernia   . DJD (degenerative joint disease)   . Strep throat   . Tenosynovitis   . Nephrolithiasis   . Diverticulitis   . Adenomatous colon polyp     Past Surgical History  Procedure Date  . Back surgery     minor disk surgery: instrumentation placed and removed in a second procedure  . Cholecystectomy 1986  . Reversal of colostomy 1987  . Wrist surgery     Remote complicated w/infection  . Colostomy 1985    After colectomy for diverticulitis  . Knee arthroscopy 2003    Left  . Appendectomy 1985  . Tonsillectomy     As a child  . Knee surgery 1956    Right w/cartilage removed  . Cataract extraction, bilateral     Family History  Problem Relation Age of Onset  . Coronary artery disease Mother   . Alcohol abuse Father   . Cirrhosis Father   . Heart failure Sister   . Breast cancer Sister     W/involvement of right arm leading to amputation  . Coronary artery disease Brother   . Heart attack Brother   . Prostate cancer Neg Hx   . Colon cancer Neg Hx   . Diabetes Neg Hx   . Glaucoma Neg Hx   . Clotting disorder Brother     History    Substance Use Topics  . Smoking status: Former Smoker    Quit date: 03/10/1981  . Smokeless tobacco: Not on file  . Alcohol Use: Yes     RARE      Review of Systems  All other systems reviewed and are negative.    Allergies  Betadine and Povidone  Home Medications   Current Outpatient Rx  Name Route Sig Dispense Refill  . ASPIRIN 81 MG PO TABS Oral Take 81 mg by mouth every other day.     . OCUVITE PO TABS Oral Take 1 tablet by mouth daily.    . CYANOCOBALAMIN 1000 MCG/15ML PO LIQD Injection Inject 1 mL as directed every 30 (thirty) days.     Marland Kitchen DILTIAZEM HCL ER 240 MG PO CP24 Oral Take 240 mg by mouth daily.    Marland Kitchen LEVOFLOXACIN 500 MG PO TABS Oral Take 1 tablet (500 mg total) by mouth daily. 21 tablet 0  . LOSARTAN POTASSIUM-HCTZ 100-25 MG PO TABS Oral Take 1 tablet by mouth daily.    Marland Kitchen TAMSULOSIN HCL 0.4 MG PO CAPS Oral Take 0.4 mg by mouth daily.  BP 156/75  Pulse 59  Temp 98 F (36.7 C) (Oral)  Resp 16  Ht 6\' 1"  (1.854 m)  Wt 225 lb (102.059 kg)  BMI 29.69 kg/m2  SpO2 91%  Physical Exam  Nursing note and vitals reviewed. Constitutional: He appears well-developed and well-nourished. No distress.  HENT:  Head: Normocephalic and atraumatic.  Right Ear: External ear normal.  Left Ear: External ear normal.  Eyes: Conjunctivae are normal. Right eye exhibits no discharge. Left eye exhibits no discharge. No scleral icterus.  Neck: Neck supple. No tracheal deviation present.  Cardiovascular: Normal rate, regular rhythm and intact distal pulses.   Pulmonary/Chest: Effort normal and breath sounds normal. No stridor. No respiratory distress. He has no wheezes. He has no rales.  Abdominal: Soft. Bowel sounds are normal. He exhibits no distension. There is no tenderness. There is no rebound and no guarding.  Genitourinary:       Pink tinged urine no clots noted in the Foley bag  Musculoskeletal: He exhibits no edema and no tenderness.  Neurological: He is alert. He  has normal strength. No sensory deficit. Cranial nerve deficit:  no gross defecits noted. He exhibits normal muscle tone. He displays no seizure activity. Coordination normal.  Skin: Skin is warm and dry. No rash noted.  Psychiatric: He has a normal mood and affect.    ED Course  Procedures (including critical care time)  Labs Reviewed  URINALYSIS, ROUTINE W REFLEX MICROSCOPIC - Abnormal; Notable for the following:    Color, Urine RED (*)  BIOCHEMICALS MAY BE AFFECTED BY COLOR   APPearance CLOUDY (*)     Hgb urine dipstick LARGE (*)     Protein, ur 30 (*)     Leukocytes, UA TRACE (*)     All other components within normal limits  URINE MICROSCOPIC-ADD ON   No results found.   1. Hematuria       MDM  Pt with new onset hematuria.   It has decreased without intervention.  No sign of infection.  Likely related to irritation from the foley.  Safe to follow up with his urologist as planned        Celene Kras, MD 10/06/11 [phone]

## 2011-10-05 NOTE — ED Notes (Signed)
[id number]> Expected date:<BR> Expected time:<BR> Means of arrival:<BR> Comments:<BR> HOLD

## 2011-10-06 LAB — CBC
MCV: 94.4 fL (ref 78.0–100.0)
Platelets: 242 10*3/uL (ref 150–400)
RDW: 14.3 % (ref 11.5–15.5)
WBC: 9.4 10*3/uL (ref 4.0–10.5)

## 2011-10-06 LAB — BASIC METABOLIC PANEL
Calcium: 10.3 mg/dL (ref 8.4–10.5)
Chloride: 100 mEq/L (ref 96–112)
Creatinine, Ser: 0.88 mg/dL (ref 0.50–1.35)
GFR calc Af Amer: >90 mL/min (ref >90)
Sodium: 136 mEq/L (ref 135–145)

## 2011-10-06 NOTE — ED Notes (Signed)
Pt seen here last week for urinary retention, coude foley placed with moderate difficulty. Pt reports having pain since that time. Pts wife noted seeing bright red urine in leg bag tonight. Pt denies fever or new pain. Pt also noted to have 02 sats of 90% on arrival, pt placed on 2L 02 by [place], sats quickly up to 98%

## 2011-10-07 ENCOUNTER — Ambulatory Visit (INDEPENDENT_AMBULATORY_CARE_PROVIDER_SITE_OTHER): Payer: Medicare Other

## 2011-10-07 DIAGNOSIS — E538 Deficiency of other specified B group vitamins: Secondary | ICD-10-CM

## 2011-10-07 LAB — URINE CULTURE

## 2011-10-07 MED ORDER — CYANOCOBALAMIN 1000 MCG/ML IJ SOLN
1000.0000 ug | Freq: Once | INTRAMUSCULAR | Status: AC
  Administered 2011-10-07: 1000 ug via INTRAMUSCULAR

## 2011-11-06 ENCOUNTER — Other Ambulatory Visit: Payer: Self-pay | Admitting: Internal Medicine

## 2011-11-07 ENCOUNTER — Ambulatory Visit (INDEPENDENT_AMBULATORY_CARE_PROVIDER_SITE_OTHER): Payer: Medicare Other

## 2011-11-07 DIAGNOSIS — E538 Deficiency of other specified B group vitamins: Secondary | ICD-10-CM

## 2011-11-07 MED ORDER — CYANOCOBALAMIN 1000 MCG/ML IJ SOLN
1000.0000 ug | Freq: Once | INTRAMUSCULAR | Status: AC
  Administered 2011-11-07: 1000 ug via INTRAMUSCULAR

## 2011-11-26 ENCOUNTER — Other Ambulatory Visit: Payer: Self-pay | Admitting: Internal Medicine

## 2011-12-08 ENCOUNTER — Ambulatory Visit (INDEPENDENT_AMBULATORY_CARE_PROVIDER_SITE_OTHER): Payer: Medicare Other

## 2011-12-08 DIAGNOSIS — E538 Deficiency of other specified B group vitamins: Secondary | ICD-10-CM

## 2011-12-08 MED ORDER — CYANOCOBALAMIN 1000 MCG/ML IJ SOLN
1000.0000 ug | Freq: Once | INTRAMUSCULAR | Status: AC
  Administered 2011-12-08: 1000 ug via INTRAMUSCULAR

## 2012-01-06 ENCOUNTER — Ambulatory Visit (INDEPENDENT_AMBULATORY_CARE_PROVIDER_SITE_OTHER): Payer: Medicare Other | Admitting: Internal Medicine

## 2012-01-06 ENCOUNTER — Other Ambulatory Visit (INDEPENDENT_AMBULATORY_CARE_PROVIDER_SITE_OTHER): Payer: Medicare Other

## 2012-01-06 ENCOUNTER — Encounter: Payer: Self-pay | Admitting: Internal Medicine

## 2012-01-06 VITALS — BP 140/76 | HR 50 | Temp 97.4°F | Resp 16 | Wt 225.0 lb

## 2012-01-06 DIAGNOSIS — I10 Essential (primary) hypertension: Secondary | ICD-10-CM

## 2012-01-06 DIAGNOSIS — G609 Hereditary and idiopathic neuropathy, unspecified: Secondary | ICD-10-CM

## 2012-01-06 DIAGNOSIS — Z0000 Encounter for general adult medical examination without abnormal findings: Secondary | ICD-10-CM

## 2012-01-06 DIAGNOSIS — Z23 Encounter for immunization: Secondary | ICD-10-CM | POA: Insufficient documentation

## 2012-01-06 DIAGNOSIS — E538 Deficiency of other specified B group vitamins: Secondary | ICD-10-CM

## 2012-01-06 DIAGNOSIS — C61 Malignant neoplasm of prostate: Secondary | ICD-10-CM

## 2012-01-06 LAB — COMPREHENSIVE METABOLIC PANEL
Albumin: 3.3 g/dL — ABNORMAL LOW (ref 3.5–5.2)
BUN: 22 mg/dL (ref 6–23)
Calcium: 10.3 mg/dL (ref 8.4–10.5)
Chloride: 102 mEq/L (ref 96–112)
GFR: 84.05 mL/min (ref >60.00)
Glucose, Bld: 80 mg/dL (ref 70–99)
Potassium: 3.8 mEq/L (ref 3.5–5.1)

## 2012-01-06 LAB — VITAMIN B12: Vitamin B-12: 253 pg/mL (ref 211–911)

## 2012-01-06 NOTE — Assessment & Plan Note (Signed)
Stable. Followed by Dr. Su Grand.

## 2012-01-06 NOTE — Progress Notes (Signed)
Subjective:    Patient ID: Jose Ware, male    DOB: 18-Sep-1934, 77 y.o.   MRN: [id number]  HPI The patient is here for annual Medicare wellness examination and management of other chronic and acute problems.  Interval: he had an episode urinary retention requiring foley catheter in July. He subsequently had gross hematuria, possibly due to the trauma of catheter placement. Catheter was removed and he has done OK but he does have urinary incontinence. He is due for follow-up with urology in January '13.   He continues to have trouble with balance: if he stands for a long period of time he will feel weak and then be at risk for fall. He denies positional vertigo   The risk factors are reflected in the social history.  The roster of all physicians providing medical care to patient - is listed in the Snapshot section of the chart.  Activities of daily living:  The patient is 100% inedpendent in all ADLs: dressing, toileting, feeding as well as independent mobility  Home safety : The patient has smoke detectors in the home. Falls - none. Grab bars in the shower.  They wear seatbelts. No firearms at home.. There is no violence in the home.   There is no risks for hepatitis, STDs or HIV. There is no   history of blood transfusion. They have a travel history to infectious disease endemic areas of the world - [place], but stayed at a resort..  The patient has seen their dentist in the last six month. They have seen their eye doctor in the last year. They deny any hearing difficulty and have not had audiologic testing in the last year.  They do not  have excessive sun exposure. Discussed the need for sun protection: hats, long sleeves and use of sunscreen if there is significant sun exposure.   Diet: the importance of a healthy diet is discussed. They do have a healthy diet.  The patient has a regular exercise program: walk ,  45 min duration, 5 per week.  The benefits of regular aerobic  exercise were discussed.  Depression screen: there are no signs or vegative symptoms of depression- irritability, change in appetite, anhedonia, sadness/tearfullness.  Cognitive assessment: the patient manages all their financial and personal affairs and is actively engaged.   Past Medical History  Diagnosis Date  . Adenocarcinoma of prostate     XRT & Radiation see implantation in 2010  . Peripheral neuropathy   . Psoriasis   . Hernia   . DJD (degenerative joint disease)   . Strep throat   . Tenosynovitis   . Nephrolithiasis   . Diverticulitis   . Adenomatous colon polyp    Past Surgical History  Procedure Date  . Back surgery     minor disk surgery: instrumentation placed and removed in a second procedure  . Cholecystectomy 1986  . Reversal of colostomy 1987  . Wrist surgery     Remote complicated w/infection  . Colostomy 1985    After colectomy for diverticulitis  . Knee arthroscopy 2003    Left  . Appendectomy 1985  . Tonsillectomy     As a child  . Knee surgery 1956    Right w/cartilage removed  . Cataract extraction, bilateral    Family History  Problem Relation Age of Onset  . Coronary artery disease Mother   . Alcohol abuse Father   . Cirrhosis Father   . Heart failure Sister   . Breast cancer Sister  W/involvement of right arm leading to amputation  . Coronary artery disease Brother   . Heart attack Brother   . Prostate cancer Neg Hx   . Colon cancer Neg Hx   . Diabetes Neg Hx   . Glaucoma Neg Hx   . Clotting disorder Brother    History   Social History  . Marital Status: Married    Spouse Name: N/A    Number of Children: N/A  . Years of Education: N/A   Occupational History  . Not on file.   Social History Main Topics  . Smoking status: Former Smoker    Quit date: 03/10/1981  . Smokeless tobacco: Not on file  . Alcohol Use: Yes     RARE  . Drug Use: No  . Sexually Active: Not on file   Other Topics Concern  . Not on file    Social History Narrative   [employer] [place], [place].Married 37 - Divorded 1996; remarried '033 sons - '63, '65, '67Grandchildren -14; 1 great grand daughterDaily Caffeine Use:  3-4 cups daily      Vision, hearing, body mass index were assessed and reviewed.   During the course of the visit the patient was educated and counseled about appropriate screening and preventive services including : fall prevention , diabetes screening, nutrition counseling, colorectal cancer screening, and recommended immunizations.   Current Outpatient Prescriptions on File Prior to Visit  Medication Sig Dispense Refill  . beta carotene w/minerals (OCUVITE) tablet Take 1 tablet by mouth daily.      . Cyanocobalamin 1000 MCG/15ML LIQD Inject 1 mL as directed every 30 (thirty) days.       Marland Kitchen diltiazem (CARDIZEM CD) 240 MG 24 hr capsule TAKE ONE CAPSULE BY MOUTH EVERY DAY  30 capsule  1  . losartan-hydrochlorothiazide (HYZAAR) 100-25 MG per tablet TAKE ONE TABLET BY MOUTH DAILY  30 tablet  1  . Tamsulosin HCl (FLOMAX) 0.4 MG CAPS Take 0.4 mg by mouth daily.      Marland Kitchen aspirin 81 MG tablet Take 81 mg by mouth every other day.       . diltiazem (DILACOR XR) 240 MG 24 hr capsule Take 240 mg by mouth daily.      Marland Kitchen losartan-hydrochlorothiazide (HYZAAR) 100-25 MG per tablet Take 1 tablet by mouth daily.        Review of Systems Constitutional:  Negative for fever, chills, activity change and unexpected weight change.  HEENT:  Negative for hearing loss, ear pain, congestion, neck stiffness and postnasal drip. Negative for sore throat or swallowing problems. Negative for dental complaints.   Eyes: Negative for vision loss or change in visual acuity.  Respiratory: Negative for chest tightness and wheezing. Negative for DOE.   Cardiovascular: Negative for chest pain or palpitations. No decreased exercise tolerance Gastrointestinal: No change in bowel habit. No bloating or gas. No reflux or indigestion Genitourinary: Negative  for urgency, frequency, flank pain and difficulty urinating.  Musculoskeletal: Negative for myalgias, back pain, arthralgias and gait problem. He has trigger finger 3rd right and right thumb will dislocate Neurological: Negative for dizziness, tremors, weakness and headaches.  Hematological: Negative for adenopathy.  Psychiatric/Behavioral: Negative for behavioral problems and dysphoric mood.       Objective:   Physical Exam Filed Vitals:   01/06/12 0902  BP: 140/76  Pulse: 50  Temp: 97.4 F (36.3 C)  Resp: 16   Wt Readings from Last 3 Encounters:  01/06/12 225 lb (102.059 kg)  10/05/11 225 lb (102.059  kg)  09/27/11 225 lb (102.059 kg)   Gen'l: Well nourished well developed white male in no acute distress  HEENT: Head: Normocephalic and atraumatic. Right Ear: External ear normal. EAC-cerumen impaction. Left Ear: External ear normal.  EAC/TM nl. Nose: Nose normal. Mouth/Throat: Oropharynx is clear and moist. Dentition - native, missing several molars and premolars but remaining teeth in good repair. No buccal or palatal lesions. Posterior pharynx clear. Eyes: Conjunctivae and sclera clear. EOM intact. Pupils are equal, round, and reactive to light. Right eye exhibits no discharge. Left eye exhibits no discharge. Neck: Normal range of motion. Neck supple. No JVD present. No tracheal deviation present. No thyromegaly present.  Cardiovascular: Normal rate, regular rhythm, no gallop, no friction rub, no murmur heard.      Quiet precordium. 2+ radial and DP pulses . No carotid bruits Pulmonary/Chest: Effort normal. No respiratory distress or increased WOB, no wheezes, no rales. No chest wall deformity or CVAT. Abdomen: Soft. Bowel sounds are normal in all quadrants. He exhibits no distension, no tenderness, no rebound or guarding, No heptosplenomegaly  Genitourinary:  Deferred to urology Musculoskeletal: Normal range of motion. He exhibits no edema and no tenderness.       Small and large  joints without redness, synovial thickening or deformity. Full range of motion preserved about all small, median and large joints.  Lymphadenopathy:    He has no cervical or supraclavicular adenopathy.  Neurological: He is alert and oriented to person, place, and time. CN II-XII intact. DTRs 2+ and symmetrical biceps, radial and patellar tendons. Cerebellar function normal with no tremor, rigidity, normal gait and station.  Skin: Skin is warm and dry. No rash noted. No erythema.  Psychiatric: He has a normal mood and affect. His behavior is normal. Thought content normal.         Assessment & Plan:

## 2012-01-06 NOTE — Patient Instructions (Addendum)
Thanks for coming to see me  Normal exam today. Will be checking B12 and chemistries. You have had normal cholesterol '10, '11 - no need to recheck.  Consider having shingles vaccine - CDC does recommend this even for people who have had shingles.  Come see me when you need me.

## 2012-01-06 NOTE — Assessment & Plan Note (Signed)
Interval history notable for urinary retention. He has otherwise been doing well. Physical exam is normal. He is current with colorectal cancer screening. Immunizations are up to date except for Shingles vaccine. Labs are pending.  IN summary - a very nice man who is doing well. He is exercising and watching his diet. His medical problems are stable. He will return in 1 year, sooner as needed.

## 2012-01-06 NOTE — Assessment & Plan Note (Signed)
He has been on B12 shots for some time.  Plan - B12 level with recommendations to follow.

## 2012-01-06 NOTE — Assessment & Plan Note (Signed)
BP Readings from Last 3 Encounters:  01/06/12 140/76  10/05/11 156/75  09/27/11 170/89

## 2012-01-07 ENCOUNTER — Encounter: Payer: Self-pay | Admitting: Internal Medicine

## 2012-01-07 ENCOUNTER — Other Ambulatory Visit: Payer: Self-pay | Admitting: *Deleted

## 2012-01-07 MED ORDER — DILTIAZEM HCL ER COATED BEADS 240 MG PO CP24: 240.0000 mg | ORAL_CAPSULE | Freq: Every day | ORAL | Status: DC

## 2012-01-07 MED ORDER — LOSARTAN POTASSIUM-HCTZ 100-25 MG PO TABS: 1.0000 | ORAL_TABLET | Freq: Every day | ORAL | Status: DC

## 2012-01-12 ENCOUNTER — Encounter: Payer: Self-pay | Admitting: Internal Medicine

## 2012-01-20 ENCOUNTER — Ambulatory Visit (INDEPENDENT_AMBULATORY_CARE_PROVIDER_SITE_OTHER): Payer: Medicare Other

## 2012-01-20 DIAGNOSIS — E538 Deficiency of other specified B group vitamins: Secondary | ICD-10-CM

## 2012-01-20 MED ORDER — CYANOCOBALAMIN 1000 MCG/ML IJ SOLN
1000.0000 ug | Freq: Once | INTRAMUSCULAR | Status: AC
  Administered 2012-01-20: 1000 ug via INTRAMUSCULAR

## 2012-02-19 ENCOUNTER — Ambulatory Visit (INDEPENDENT_AMBULATORY_CARE_PROVIDER_SITE_OTHER): Payer: Medicare Other | Admitting: *Deleted

## 2012-02-19 DIAGNOSIS — E538 Deficiency of other specified B group vitamins: Secondary | ICD-10-CM

## 2012-02-19 MED ORDER — CYANOCOBALAMIN 1000 MCG/ML IJ SOLN
1000.0000 ug | Freq: Once | INTRAMUSCULAR | Status: AC
  Administered 2012-02-19: 1000 ug via INTRAMUSCULAR

## 2012-03-22 ENCOUNTER — Ambulatory Visit (INDEPENDENT_AMBULATORY_CARE_PROVIDER_SITE_OTHER): Payer: Medicare Other | Admitting: *Deleted

## 2012-03-22 DIAGNOSIS — E538 Deficiency of other specified B group vitamins: Secondary | ICD-10-CM

## 2012-03-22 MED ORDER — CYANOCOBALAMIN 1000 MCG/ML IJ SOLN
1000.0000 ug | Freq: Once | INTRAMUSCULAR | Status: AC
  Administered 2012-03-22: 1000 ug via INTRAMUSCULAR

## 2012-03-31 ENCOUNTER — Encounter: Payer: Self-pay | Admitting: Internal Medicine

## 2012-04-22 ENCOUNTER — Ambulatory Visit: Payer: Medicare Other

## 2012-04-26 ENCOUNTER — Ambulatory Visit (INDEPENDENT_AMBULATORY_CARE_PROVIDER_SITE_OTHER): Payer: Medicare Other

## 2012-04-26 DIAGNOSIS — E538 Deficiency of other specified B group vitamins: Secondary | ICD-10-CM

## 2012-04-26 MED ORDER — CYANOCOBALAMIN 1000 MCG/ML IJ SOLN
1000.0000 ug | Freq: Once | INTRAMUSCULAR | Status: AC
  Administered 2012-04-26: 1000 ug via INTRAMUSCULAR

## 2012-05-19 ENCOUNTER — Ambulatory Visit (INDEPENDENT_AMBULATORY_CARE_PROVIDER_SITE_OTHER): Payer: Medicare Other

## 2012-05-19 MED ORDER — CYANOCOBALAMIN 1000 MCG/ML IJ SOLN
1000.0000 ug | Freq: Once | INTRAMUSCULAR | Status: AC
  Administered 2012-05-19: 1000 ug via INTRAMUSCULAR

## 2012-05-20 ENCOUNTER — Ambulatory Visit: Payer: Medicare Other

## 2012-06-21 ENCOUNTER — Ambulatory Visit (INDEPENDENT_AMBULATORY_CARE_PROVIDER_SITE_OTHER): Payer: Medicare Other

## 2012-06-21 DIAGNOSIS — E538 Deficiency of other specified B group vitamins: Secondary | ICD-10-CM

## 2012-06-21 MED ORDER — CYANOCOBALAMIN 1000 MCG/ML IJ SOLN
1000.0000 ug | Freq: Once | INTRAMUSCULAR | Status: AC
  Administered 2012-06-21: 1000 ug via INTRAMUSCULAR

## 2012-07-21 ENCOUNTER — Ambulatory Visit (INDEPENDENT_AMBULATORY_CARE_PROVIDER_SITE_OTHER): Payer: Medicare Other

## 2012-07-21 DIAGNOSIS — E538 Deficiency of other specified B group vitamins: Secondary | ICD-10-CM

## 2012-07-21 MED ORDER — CYANOCOBALAMIN 1000 MCG/ML IJ SOLN
1000.0000 ug | Freq: Once | INTRAMUSCULAR | Status: AC
  Administered 2012-07-21: 1000 ug via INTRAMUSCULAR

## 2012-08-23 ENCOUNTER — Ambulatory Visit (INDEPENDENT_AMBULATORY_CARE_PROVIDER_SITE_OTHER): Payer: Medicare Other

## 2012-08-23 DIAGNOSIS — E538 Deficiency of other specified B group vitamins: Secondary | ICD-10-CM

## 2012-08-23 MED ORDER — CYANOCOBALAMIN 1000 MCG/ML IJ SOLN
1000.0000 ug | Freq: Once | INTRAMUSCULAR | Status: AC
  Administered 2012-08-23: 1000 ug via INTRAMUSCULAR

## 2012-09-22 ENCOUNTER — Other Ambulatory Visit: Payer: Self-pay | Admitting: Internal Medicine

## 2012-09-22 ENCOUNTER — Encounter: Payer: Self-pay | Admitting: Internal Medicine

## 2012-09-22 ENCOUNTER — Ambulatory Visit (INDEPENDENT_AMBULATORY_CARE_PROVIDER_SITE_OTHER): Payer: Medicare Other

## 2012-09-22 DIAGNOSIS — E538 Deficiency of other specified B group vitamins: Secondary | ICD-10-CM

## 2012-09-22 DIAGNOSIS — G609 Hereditary and idiopathic neuropathy, unspecified: Secondary | ICD-10-CM

## 2012-09-22 DIAGNOSIS — T148XXA Other injury of unspecified body region, initial encounter: Secondary | ICD-10-CM

## 2012-09-22 MED ORDER — CYANOCOBALAMIN 1000 MCG/ML IJ SOLN: 1000.0000 ug | Freq: Once | INTRAMUSCULAR | Status: DC

## 2012-09-22 MED ORDER — CYANOCOBALAMIN 1000 MCG/ML IJ SOLN
1000.0000 ug | Freq: Once | INTRAMUSCULAR | Status: AC
  Administered 2012-09-22: 1000 ug via INTRAMUSCULAR

## 2012-09-23 ENCOUNTER — Ambulatory Visit (INDEPENDENT_AMBULATORY_CARE_PROVIDER_SITE_OTHER): Payer: Medicare Other | Admitting: Internal Medicine

## 2012-09-23 ENCOUNTER — Encounter: Payer: Self-pay | Admitting: Internal Medicine

## 2012-09-23 ENCOUNTER — Other Ambulatory Visit (INDEPENDENT_AMBULATORY_CARE_PROVIDER_SITE_OTHER): Payer: Medicare Other

## 2012-09-23 VITALS — BP 124/62 | HR 56 | Temp 98.3°F | Ht 73.0 in | Wt 228.1 lb

## 2012-09-23 DIAGNOSIS — R5381 Other malaise: Secondary | ICD-10-CM

## 2012-09-23 DIAGNOSIS — R5383 Other fatigue: Secondary | ICD-10-CM

## 2012-09-23 DIAGNOSIS — R42 Dizziness and giddiness: Secondary | ICD-10-CM

## 2012-09-23 DIAGNOSIS — I10 Essential (primary) hypertension: Secondary | ICD-10-CM

## 2012-09-23 DIAGNOSIS — R001 Bradycardia, unspecified: Secondary | ICD-10-CM

## 2012-09-23 DIAGNOSIS — R531 Weakness: Secondary | ICD-10-CM

## 2012-09-23 DIAGNOSIS — I498 Other specified cardiac arrhythmias: Secondary | ICD-10-CM

## 2012-09-23 DIAGNOSIS — T148XXA Other injury of unspecified body region, initial encounter: Secondary | ICD-10-CM

## 2012-09-23 LAB — URINALYSIS, ROUTINE W REFLEX MICROSCOPIC
Bilirubin Urine: NEGATIVE
Ketones, ur: NEGATIVE
Leukocytes, UA: NEGATIVE
Urine Glucose: NEGATIVE
pH: 5 (ref 5.0–8.0)

## 2012-09-23 LAB — CBC WITH DIFFERENTIAL/PLATELET
Basophils Absolute: 0 10*3/uL (ref 0.0–0.1)
Eosinophils Relative: 1.9 % (ref 0.0–5.0)
HCT: 44.5 % (ref 39.0–52.0)
Hemoglobin: 14.9 g/dL (ref 13.0–17.0)
Lymphs Abs: 1.2 10*3/uL (ref 0.7–4.0)
MCV: 98.2 fl (ref 78.0–100.0)
Monocytes Absolute: 0.9 10*3/uL (ref 0.1–1.0)
Neutro Abs: 9.2 10*3/uL — ABNORMAL HIGH (ref 1.4–7.7)
Platelets: 275 10*3/uL (ref 150.0–400.0)
RDW: 14.6 % (ref 11.5–14.6)

## 2012-09-23 LAB — PROTIME-INR
INR: 1.2 ratio — ABNORMAL HIGH (ref 0.8–1.0)
Prothrombin Time: 12.5 s — ABNORMAL HIGH (ref 10.2–12.4)

## 2012-09-23 LAB — HEPATIC FUNCTION PANEL
AST: 12 U/L (ref 0–37)
Alkaline Phosphatase: 86 U/L (ref 39–117)
Total Bilirubin: 0.6 mg/dL (ref 0.3–1.2)

## 2012-09-23 LAB — BASIC METABOLIC PANEL
GFR: 69.11 mL/min (ref >60.00)
Potassium: 3.9 mEq/L (ref 3.5–5.1)
Sodium: 139 mEq/L (ref 135–145)

## 2012-09-23 LAB — TSH: TSH: 2.41 u[IU]/mL (ref 0.35–5.50)

## 2012-09-23 LAB — SEDIMENTATION RATE: Sed Rate: 41 mm/hr — ABNORMAL HIGH (ref 0–22)

## 2012-09-23 MED ORDER — AMLODIPINE BESYLATE 5 MG PO TABS: 5.0000 mg | ORAL_TABLET | Freq: Every day | ORAL | Status: DC

## 2012-09-23 NOTE — Assessment & Plan Note (Addendum)
I suspect now may be his major issue; ECG reviewed as per emr, to d/c dilacor

## 2012-09-23 NOTE — Assessment & Plan Note (Addendum)
Arms only, recent asa use, doubt bleeding diathesis, ok to restart asa, avoid truama to arms working in the yard, and check labs as suggested per PCP  Note:  Total time for pt hx, exam, review of record with pt in the room, determination of diagnoses and plan for further eval and tx is > 40 min, with over 50% spent in coordination and counseling of patient

## 2012-09-23 NOTE — Patient Instructions (Addendum)
Your ECG was OK except for the LOW Heart Rate Please stop the Diltiazem medication Please take all new medication as prescribed - the amlodipine 5 mg per day (for blood pressure) Your Blood pressure did not fall with standing Please continue all other medications as before, including the Aspirin at 81 mg Please realize the combination of Aspirin and thin skin will lead to more future mild bruising, but this is better than heart trouble or stroke Please go to the LAB in the Basement (turn left off the elevator) for the tests to be done today You will be contacted by phone if any changes need to be made immediately.  Otherwise, you will receive a letter about your results with an explanation, but please check with MyChart first.  Please remember to sign up for My Chart if you have not done so, as this will be important to you in the future with finding out test results, communicating by private email, and scheduling acute appointments online when needed.  Please see Dr Debby Bud in 3 weeks to check on HR, how you are feeling and the Blood Pressure

## 2012-09-23 NOTE — Assessment & Plan Note (Addendum)
For labs including b12 but hoping this is related to bradycardia

## 2012-09-23 NOTE — Assessment & Plan Note (Signed)
Will add back amlod 5 qd, pt to f/u in 3 wks with Dr Debby Bud

## 2012-09-23 NOTE — Progress Notes (Signed)
Subjective:    Patient ID: Jose Ware, male    DOB: August 21, 1934, 78 y.o.   MRN: [id number]  HPI here with mult bruises to arms only after working in the yard despite stopping the ASA 81 mg about 2 wks ago.  No other overt bleeding or bruising or hx of same. Pt denies chest pain, increased sob or doe, wheezing, orthopnea, PND, increased LE swelling, palpitations, or syncope but has incidentally noted ongoing up to 1 yr difficulty with lightheadedness and shakiness of the extremities, general weakness with standing up more than a few minutes.  Pt denies new neurological symptoms such as new headache, or facial or extremity weakness or numbness   Pt denies polydipsia, polyuria Current meds for BP purpose only per pt Past Medical History  Diagnosis Date  . Adenocarcinoma of prostate     XRT & Radiation see implantation in 2010  . Peripheral neuropathy   . Psoriasis   . Hernia   . DJD (degenerative joint disease)   . Strep throat   . Tenosynovitis   . Nephrolithiasis   . Diverticulitis   . Adenomatous colon polyp    Past Surgical History  Procedure Laterality Date  . Back surgery      minor disk surgery: instrumentation placed and removed in a second procedure  . Cholecystectomy  1986  . Reversal of colostomy  1987  . Wrist surgery      Remote complicated w/infection  . Colostomy  1985    After colectomy for diverticulitis  . Knee arthroscopy  2003    Left  . Appendectomy  1985  . Tonsillectomy      As a child  . Knee surgery  1956    Right w/cartilage removed  . Cataract extraction, bilateral      reports that he quit smoking about 31 years ago. He does not have any smokeless tobacco history on file. He reports that  drinks alcohol. He reports that he does not use illicit drugs. family history includes Alcohol abuse in his father; Breast cancer in his sister; Cirrhosis in his father; Clotting disorder in his brother; Coronary artery disease in his brother and mother; Heart  attack in his brother; and Heart failure in his sister.  There is no history of Prostate cancer, and Colon cancer, and Diabetes, and Glaucoma, . Allergies  Allergen Reactions  . Betadine (Povidone Iodine)     blistering  . Povidone     REACTION: blisters   Current Outpatient Prescriptions on File Prior to Visit  Medication Sig Dispense Refill  . aspirin 81 MG tablet Take 81 mg by mouth every other day.       . beta carotene w/minerals (OCUVITE) tablet Take 1 tablet by mouth daily.      . Cyanocobalamin 1000 MCG/15ML LIQD Inject 1 mL as directed every 30 (thirty) days.       Marland Kitchen losartan-hydrochlorothiazide (HYZAAR) 100-25 MG per tablet Take 1 tablet by mouth daily.  90 tablet  3  . Tamsulosin HCl (FLOMAX) 0.4 MG CAPS Take 0.4 mg by mouth daily.       No current facility-administered medications on file prior to visit.   ,Review of Systems  Constitutional: Negative for unexpected weight change, or unusual diaphoresis  HENT: Negative for tinnitus.   Eyes: Negative for photophobia and visual disturbance.  Respiratory: Negative for choking and stridor.   Gastrointestinal: Negative for vomiting and blood in stool.  Genitourinary: Negative for hematuria and decreased urine volume.  Musculoskeletal:  Negative for acute joint swelling Skin: Negative for color change and wound.  Neurological: Negative for tremors and numbness other than noted  Psychiatric/Behavioral: Negative for decreased concentration or  hyperactivity.       Objective:   Physical Exam BP 124/62  Pulse 56  Temp(Src) 98.3 F (36.8 C) (Oral)  Ht 6\' 1"  (1.854 m)  Wt 228 lb 2 oz (103.477 kg)  BMI 30.1 kg/m2  SpO2 94% VS noted, not ill appearing Constitutional: Pt appears well-developed and well-nourished.  HENT: Head: NCAT.  Right Ear: External ear normal.  Left Ear: External ear normal.  Eyes: Conjunctivae and EOM are normal. Pupils are equal, round, and reactive to light.  Neck: Normal range of motion. Neck supple.   Cardiovascular: Normal rate and regular rhythm.   Pulmonary/Chest: Effort normal and breath sounds normal.  - no rales or wheezing Abd:  Soft, NT, non-distended, + BS Neurological: Pt is alert. Not confused , motor 5/5 Skin: Skin is warm. No erythema. 3-4 discrete small nontender bruises to bilat post arms and left wrist, none other noted Psychiatric: Pt behavior is normal. Thought content normal. mild nervous    Assessment & Plan:

## 2012-10-19 ENCOUNTER — Ambulatory Visit (INDEPENDENT_AMBULATORY_CARE_PROVIDER_SITE_OTHER): Payer: Medicare Other | Admitting: Internal Medicine

## 2012-10-19 ENCOUNTER — Encounter: Payer: Self-pay | Admitting: Internal Medicine

## 2012-10-19 VITALS — BP 146/70 | HR 57 | Temp 97.5°F | Resp 16 | Wt 224.0 lb

## 2012-10-19 DIAGNOSIS — I10 Essential (primary) hypertension: Secondary | ICD-10-CM

## 2012-10-19 DIAGNOSIS — I498 Other specified cardiac arrhythmias: Secondary | ICD-10-CM

## 2012-10-19 DIAGNOSIS — Z0000 Encounter for general adult medical examination without abnormal findings: Secondary | ICD-10-CM

## 2012-10-19 DIAGNOSIS — E538 Deficiency of other specified B group vitamins: Secondary | ICD-10-CM

## 2012-10-19 DIAGNOSIS — R001 Bradycardia, unspecified: Secondary | ICD-10-CM

## 2012-10-19 NOTE — Progress Notes (Signed)
Subjective:     Patient ID: Jose Ware, male   DOB: 07/03/1934, 78 y.o.   MRN: [id number]  HPI Comments: Mr. Creasy is a 78 year old gentleman here to follow up after a medication change three weeks ago.  He changed from diltiazem to amlodipine to treat his hypertension.  Dizziness has been resolving following this change.  He reports that upon standing for any length of time, starts shaking minimally and feels like he's losing his balance, to a lesser extent than before.  Flomax has been working.  No problems with blockage since starting it.   Review of Systems  Eyes: Negative for photophobia, itching and visual disturbance.  Respiratory: Negative for apnea, cough, choking, chest tightness and shortness of breath.   Cardiovascular: Negative for chest pain and palpitations.   Addendum: he had been seen 3 weeks ago by Dr. Jonny Ruiz for dizziness and weakness. He was found at that visit to be bradycardic and had diltiazem stopped. He was also having bruising and he had stopped ASA. Dr. Jonny Ruiz advised resuming ASA.    Objective:   Physical Exam  Constitutional: He is oriented to person, place, and time. He appears well-developed and well-nourished. No distress.  HENT:  Head: Normocephalic and atraumatic.  Right Ear: External ear normal.  Left Ear: External ear normal.  Nose: Nose normal.  Mouth/Throat: Oropharynx is clear and moist.  Eyes: Conjunctivae and EOM are normal. Pupils are equal, round, and reactive to light.  Neck: Normal range of motion. No tracheal deviation present. No thyromegaly present.  Cardiovascular: Normal rate, regular rhythm, normal heart sounds and intact distal pulses.  Exam reveals no gallop and no friction rub.   No murmur heard. Pulmonary/Chest: Effort normal and breath sounds normal. No respiratory distress. He has no wheezes. He has no rales. He exhibits no tenderness.  Abdominal: Soft. Bowel sounds are normal.  Neurological: He is alert and oriented to  person, place, and time. No cranial nerve deficit.  Skin: He is not diaphoretic.   Filed Vitals:   10/19/12 0933  BP: 146/70  Pulse: 57  Temp: 97.5 F (36.4 C)  Resp: 16       Assessment and Plan:     1. Tremor and dizziness.  These have stopped since his diltiazem was changed to amlodipine.  He remains mildly bradycardic. Plan: keep taking amlodipine as directed.  2. Hypertension.  This is well managed under his current regimen.  Plan: keep taking amlodipine as directed.  3. B12 deficiency.  We will reevaluate B12 levels in 4 months. Plan: An assay has been ordered for or after December 15.  Treatment will be reassessed at that time and we will reconsider oral or IM supplementation at that time as needed.  4. Health maintenance.  Mr. Muckey declines a Herpes zoster vaccine today.  Excepting that, he is up to date on vaccinations.

## 2012-10-19 NOTE — Patient Instructions (Addendum)
Thanks for working with me Romeo Apple) today!  Your blood pressure is well-controlled by your amlodipine and hydrochlorothiazide.  Please keep taking these.  I'm glad to hear that switching from diltiazem to amiloride has caused your tremor and dizziness to decrease.    Come back on or after February 21, 2013 for [place] to measure your B12 levels.  We will decide at that time whether to continue B12 supplementation and if necessary, how to supplement it.

## 2012-10-20 ENCOUNTER — Ambulatory Visit: Payer: Medicare Other

## 2012-10-20 NOTE — Assessment & Plan Note (Signed)
Has been changed from diltiazem to amlodipine. Heart 57 at rest but he is not c/o dizziness. EKG from July 17, '14 reviewed - sinus bradycardia w/o other changes.  Plan For recurrent dizziness will need step test, consider stopping CCB altogether.

## 2012-10-20 NOTE — Assessment & Plan Note (Signed)
Patient with supra-normal B12 level - > 1500  Plan Hold B12 replacment  Recheck in 4 months with recommendations to follow

## 2012-10-20 NOTE — Assessment & Plan Note (Signed)
BP Readings from Last 3 Encounters:  10/19/12 146/70  09/23/12 124/62  01/06/12 140/76   Adequate control per JNC 8

## 2012-12-21 ENCOUNTER — Ambulatory Visit (INDEPENDENT_AMBULATORY_CARE_PROVIDER_SITE_OTHER): Payer: Medicare Other

## 2012-12-21 DIAGNOSIS — Z23 Encounter for immunization: Secondary | ICD-10-CM

## 2013-01-22 ENCOUNTER — Other Ambulatory Visit: Payer: Self-pay | Admitting: Internal Medicine

## 2013-03-24 ENCOUNTER — Ambulatory Visit (INDEPENDENT_AMBULATORY_CARE_PROVIDER_SITE_OTHER): Payer: Medicare Other | Admitting: Internal Medicine

## 2013-03-24 ENCOUNTER — Encounter: Payer: Self-pay | Admitting: Internal Medicine

## 2013-03-24 VITALS — BP 130/70 | HR 60 | Temp 97.8°F | Wt 227.0 lb

## 2013-03-24 DIAGNOSIS — I10 Essential (primary) hypertension: Secondary | ICD-10-CM

## 2013-03-24 DIAGNOSIS — M545 Low back pain, unspecified: Secondary | ICD-10-CM

## 2013-03-24 MED ORDER — PREDNISONE 10 MG PO TABS: ORAL_TABLET | ORAL | Status: DC

## 2013-03-24 MED ORDER — HYDROCODONE-ACETAMINOPHEN 5-325 MG PO TABS: 1.0000 | ORAL_TABLET | Freq: Four times a day (QID) | ORAL | Status: DC | PRN

## 2013-03-24 MED ORDER — CYCLOBENZAPRINE HCL 5 MG PO TABS: 5.0000 mg | ORAL_TABLET | Freq: Three times a day (TID) | ORAL | Status: DC | PRN

## 2013-03-24 NOTE — Progress Notes (Signed)
Pre-visit discussion using our clinic review tool. No additional management support is needed unless otherwise documented below in the visit note.  

## 2013-03-24 NOTE — Patient Instructions (Signed)
Please take all new medication as prescribed - the pain medication, prednisone, and muscle relaxer as prescribed  Please continue all other medications as before Please have the pharmacy call with any other refills you may need.  If not improved in 3-5 days, please see Dr Smith/sports medicine in our office for further evaluation

## 2013-03-24 NOTE — Progress Notes (Signed)
Subjective:    Patient ID: Jose Ware, male    DOB: 22-Aug-1934, 79 y.o.   MRN: [id number]  HPI  Her with acute onset left lower back pain, sharp, severe, radiating left buttock, no LLE involvement such as pain/weakness/numbeness, much severe to stand, has to hold on to furniture and walks with crutch to help take wt off the left side; 2 alleve last pm may have helped at the start, but yest worse, no falls, no trauma, fever, chills.  Did have a cough for a few days recently now resolved. No recent back problems except for intemittent mild aches, did fall off roof at 79yo with spinal fx,No recnet xray, MRI, no surgical recent, no PT.  Pt denies chest pain, increased sob or doe, wheezing, orthopnea, PND, increased LE swelling, palpitations, dizziness or syncope.   Pt denies polydipsia, polyuria  Past Medical History  Diagnosis Date  . Adenocarcinoma of prostate     XRT & Radiation see implantation in 2010  . Peripheral neuropathy   . Psoriasis   . Hernia   . DJD (degenerative joint disease)   . Strep throat   . Tenosynovitis   . Nephrolithiasis   . Diverticulitis   . Adenomatous colon polyp    Past Surgical History  Procedure Laterality Date  . Back surgery      minor disk surgery: instrumentation placed and removed in a second procedure  . Cholecystectomy  1986  . Reversal of colostomy  1987  . Wrist surgery      Remote complicated w/infection  . Colostomy  1985    After colectomy for diverticulitis  . Knee arthroscopy  2003    Left  . Appendectomy  1985  . Tonsillectomy      As a child  . Knee surgery  1956    Right w/cartilage removed  . Cataract extraction, bilateral      reports that he quit smoking about 32 years ago. He does not have any smokeless tobacco history on file. He reports that he drinks alcohol. He reports that he does not use illicit drugs. family history includes Alcohol abuse in his father; Breast cancer in his sister; Cirrhosis in his father;  Clotting disorder in his brother; Coronary artery disease in his brother and mother; Heart attack in his brother; Heart failure in his sister. There is no history of Prostate cancer, Colon cancer, Diabetes, or Glaucoma. Allergies  Allergen Reactions  . Betadine [Povidone Iodine]     blistering  . Povidone     REACTION: blisters   Current Outpatient Prescriptions on File Prior to Visit  Medication Sig Dispense Refill  . amLODipine (NORVASC) 5 MG tablet Take 1 tablet (5 mg total) by mouth daily.  90 tablet  3  . aspirin 81 MG tablet Take 81 mg by mouth every other day.       . beta carotene w/minerals (OCUVITE) tablet Take 1 tablet by mouth daily.      Marland Kitchen losartan-hydrochlorothiazide (HYZAAR) 100-25 MG per tablet TAKE ONE TABLET BY MOUTH DAILY.  90 tablet  3   No current facility-administered medications on file prior to visit.   Review of Systems  Constitutional: Negative for unexpected weight change, or unusual diaphoresis  HENT: Negative for tinnitus.   Eyes: Negative for photophobia and visual disturbance.  Respiratory: Negative for choking and stridor.   Gastrointestinal: Negative for vomiting and blood in stool.  Genitourinary: Negative for hematuria and decreased urine volume.  Musculoskeletal: Negative for acute joint  swelling Skin: Negative for color change and wound.  Neurological: Negative for tremors and numbness other than noted  Psychiatric/Behavioral: Negative for decreased concentration or  hyperactivity.       Objective:   Physical Exam BP 130/70  Pulse 60  Temp(Src) 97.8 F (36.6 C) (Oral)  Wt 227 lb (102.967 kg)  SpO2 94% VS noted,  Constitutional: Pt appears well-developed and well-nourished.  HENT: Head: NCAT.  Right Ear: External ear normal.  Left Ear: External ear normal.  Eyes: Conjunctivae and EOM are normal. Pupils are equal, round, and reactive to light.  Neck: Normal range of motion. Neck supple.  Cardiovascular: Normal rate and regular rhythm.     Pulmonary/Chest: Effort normal and breath sounds normal.  Abd:  Soft, NT, non-distended, + BS Neurological: Pt is alert. Not confused , cn 2-12 intact, motor 5/5, dtr/sens intact, markedly antalgic gait , prefers crutches Spine nontender, but has mod SI joint area tender, and left paravertebral tender/spasm Skin: Skin is warm. No erythema.  Psychiatric: Pt behavior is normal. Thought content normal.     Assessment & Plan:

## 2013-03-26 NOTE — Assessment & Plan Note (Signed)
stable overall by history and exam, recent data reviewed with pt, and pt to continue medical treatment as before,  to f/u any worsening symptoms or concerns BP Readings from Last 3 Encounters:  03/24/13 130/70  10/19/12 146/70  09/23/12 124/62

## 2013-03-26 NOTE — Assessment & Plan Note (Addendum)
Neuro exam benign, for med tx with pain control, muscle relaxer, predpack, consider MRI if not improved , consider sport med referral

## 2013-09-15 ENCOUNTER — Other Ambulatory Visit: Payer: Self-pay

## 2013-09-15 MED ORDER — AMLODIPINE BESYLATE 5 MG PO TABS: 5.0000 mg | ORAL_TABLET | Freq: Every day | ORAL | Status: DC

## 2013-09-27 ENCOUNTER — Encounter: Payer: Self-pay | Admitting: Internal Medicine

## 2013-09-27 NOTE — Telephone Encounter (Signed)
It appears that you are likely due for physical in September, although I am not sure who your new Primary physician is at this time.  We have a new MD - Dr Doug Sou starting in September.  Perhaps this would be the most appropriate for that purpose.  If you are having symptoms as you described, however, this really should not wait, and should ideally be addressed sooner.  I will have the office call you. thanks   Shirlean Mylar to inform pt, and refer to scheduling for next available, any MD

## 2013-12-01 ENCOUNTER — Ambulatory Visit (INDEPENDENT_AMBULATORY_CARE_PROVIDER_SITE_OTHER): Payer: Medicare Other | Admitting: Family Medicine

## 2013-12-01 ENCOUNTER — Encounter: Payer: Self-pay | Admitting: Family Medicine

## 2013-12-01 VITALS — BP 144/78 | HR 64 | Temp 98.2°F | Wt 226.0 lb

## 2013-12-01 DIAGNOSIS — R5383 Other fatigue: Secondary | ICD-10-CM | POA: Insufficient documentation

## 2013-12-01 DIAGNOSIS — E538 Deficiency of other specified B group vitamins: Secondary | ICD-10-CM

## 2013-12-01 DIAGNOSIS — Z85028 Personal history of other malignant neoplasm of stomach: Secondary | ICD-10-CM | POA: Insufficient documentation

## 2013-12-01 DIAGNOSIS — I10 Essential (primary) hypertension: Secondary | ICD-10-CM

## 2013-12-01 DIAGNOSIS — N182 Chronic kidney disease, stage 2 (mild): Secondary | ICD-10-CM | POA: Insufficient documentation

## 2013-12-01 DIAGNOSIS — R5381 Other malaise: Secondary | ICD-10-CM

## 2013-12-01 DIAGNOSIS — Z23 Encounter for immunization: Secondary | ICD-10-CM

## 2013-12-01 LAB — COMPREHENSIVE METABOLIC PANEL
ALK PHOS: 94 U/L (ref 39–117)
ALT: 12 U/L (ref 0–53)
AST: 13 U/L (ref 0–37)
Albumin: 3.6 g/dL (ref 3.5–5.2)
BILIRUBIN TOTAL: 0.7 mg/dL (ref 0.2–1.2)
BUN: 21 mg/dL (ref 6–23)
CO2: 32 mEq/L (ref 19–32)
Calcium: 11 mg/dL — ABNORMAL HIGH (ref 8.4–10.5)
Chloride: 101 mEq/L (ref 96–112)
Creatinine, Ser: 1 mg/dL (ref 0.4–1.5)
GFR: 78.72 mL/min (ref >60.00)
Glucose, Bld: 85 mg/dL (ref 70–99)
POTASSIUM: 4.4 meq/L (ref 3.5–5.1)
SODIUM: 139 meq/L (ref 135–145)
TOTAL PROTEIN: 6.6 g/dL (ref 6.0–8.3)

## 2013-12-01 LAB — CBC WITH DIFFERENTIAL/PLATELET
BASOS PCT: 0.4 % (ref 0.0–3.0)
Basophils Absolute: 0 10*3/uL (ref 0.0–0.1)
EOS PCT: 2.1 % (ref 0.0–5.0)
Eosinophils Absolute: 0.2 10*3/uL (ref 0.0–0.7)
HEMATOCRIT: 46.2 % (ref 39.0–52.0)
HEMOGLOBIN: 15.2 g/dL (ref 13.0–17.0)
LYMPHS ABS: 1.3 10*3/uL (ref 0.7–4.0)
Lymphocytes Relative: 11.3 % — ABNORMAL LOW (ref 12.0–46.0)
MCHC: 32.9 g/dL (ref 30.0–36.0)
MCV: 98.3 fl (ref 78.0–100.0)
MONO ABS: 0.9 10*3/uL (ref 0.1–1.0)
Monocytes Relative: 7.7 % (ref 3.0–12.0)
NEUTROS ABS: 8.8 10*3/uL — AB (ref 1.4–7.7)
Neutrophils Relative %: 78.5 % — ABNORMAL HIGH (ref 43.0–77.0)
Platelets: 308 10*3/uL (ref 150.0–400.0)
RBC: 4.7 Mil/uL (ref 4.22–5.81)
RDW: 15.7 % — ABNORMAL HIGH (ref 11.5–15.5)
WBC: 11.1 10*3/uL — AB (ref 4.0–10.5)

## 2013-12-01 LAB — TSH: TSH: 2.17 u[IU]/mL (ref 0.35–4.50)

## 2013-12-01 LAB — LDL CHOLESTEROL, DIRECT: Direct LDL: 68.1 mg/dL

## 2013-12-01 LAB — VITAMIN B12: Vitamin B-12: >1500 pg/mL — ABNORMAL HIGH (ref 211–911)

## 2013-12-01 NOTE — Assessment & Plan Note (Addendum)
Doubt cardiac-no chest pain or dyspnea on exertion or orthopnea or PND. Could be sleep apnea but not a lot of snoring and no recent weight gain. CBC-evaluate for anemia or potential leukemia with family history, TSH. Could be low b12. Start with basic labs. Follow up 3 months unless symptoms worsen if labs normal. Patient up to date on cancer screening. Has regular follow up with urology for known prostate cancer and has had stable PSA. "stomach cancer" history unclear but no current abdominal complaints.

## 2013-12-01 NOTE — Patient Instructions (Signed)
Not sure of cause of fatigue. Check labs today. Follow up in 3 months if stable, sooner if symptoms worsen or new problems arise. Absolute minimum once yearly.  We are going to tolerate blood pressure being slightly high due to the dizziness you get at times with standing.   Flu shot today

## 2013-12-01 NOTE — Progress Notes (Signed)
Garret Reddish, MD Phone: [phone]  Subjective:  Patient presents today to establish care with me as their new primary care provider and with complaint of fatigue. Patient was formerly a patient of Dr. Linda Hedges. Chief complaint-noted.   Fatigue Low Vitamin B12 Daytime sleepiness Middle brother died in 61 from leukemia. Older brother died from heart disease but had leukemia. Another brother had leukemia. Patient noticing a lot of bruising. Does take 81 mg aspirin every other day. Daytime sleepiness if not focused on an activity for last 8-9 months.  No history sleep apnea. Known low vitamin b12 and has changed to just OTC.  ROS-Overall does feel fatigued. No increase in snoring.occasional dizziness if stand for prolonged period (last 1-2 years with no recent change). no chest pain or dyspnea on exertion or orthopnea or PND.   The following were reviewed and entered/updated in epic: Past Medical History  Diagnosis Date  . Adenocarcinoma of prostate     XRT & Radiation see implantation in 2010  . Peripheral neuropathy   . Psoriasis   . Hernia   . DJD (degenerative joint disease)   . Strep throat   . Tenosynovitis   . Nephrolithiasis   . Diverticulitis   . Adenomatous colon polyp   . DIVERTICULITIS, HX OF 11/27/2006        . NEPHROLITHIASIS, HX OF 11/27/2006          Patient Active Problem List   Diagnosis Date Noted  . ADENOCARCINOMA, PROSTATE 12/14/2008    Priority: High  . CKD (chronic kidney disease), stage II 12/01/2013    Priority: Medium  . HYPERTENSION 11/27/2006    Priority: Medium  . History of stomach cancer 12/01/2013    Priority: Low  . Lower back pain 03/24/2013    Priority: Low  . Bradycardia 09/23/2012    Priority: Low  . Bruising 09/23/2012    Priority: Low  . B12 deficiency 09/17/2010    Priority: Low  . COLONIC POLYPS, HX OF 12/14/2008    Priority: Low  . PERIPHERAL NEUROPATHY 10/18/2007    Priority: Low  . PSORIASIS 10/15/2007    Priority: Low   . DEGENERATIVE JOINT DISEASE 11/27/2006    Priority: Low  . NEPHROLITHIASIS, HX OF 11/27/2006    Priority: Low  . Other malaise and fatigue 12/01/2013   Past Surgical History  Procedure Laterality Date  . Back surgery      minor disk surgery: instrumentation placed and removed in a second procedure  . Cholecystectomy  1986  . Reversal of colostomy  1987  . Wrist surgery      Remote complicated w/infection  . Colostomy  1985    After colectomy for diverticulitis  . Knee arthroscopy  2003    Left  . Appendectomy  1985  . Tonsillectomy      As a child  . Knee surgery  1956    Left 1956, Right w/cartilage removed later  . Cataract extraction, bilateral      Family History  Problem Relation Age of Onset  . Coronary artery disease Mother   . Alcohol abuse Father   . Cirrhosis Father   . Heart failure Sister   . Breast cancer Sister     W/involvement of right arm leading to amputation  . Coronary artery disease Brother   . Heart attack Brother   . Prostate cancer Neg Hx   . Colon cancer Neg Hx   . Diabetes Neg Hx   . Glaucoma Neg Hx   .  Clotting disorder Brother     Medications- reviewed and updated Current Outpatient Prescriptions  Medication Sig Dispense Refill  . amLODipine (NORVASC) 5 MG tablet Take 1 tablet (5 mg total) by mouth daily.  90 tablet  2  . aspirin 81 MG tablet Take 81 mg by mouth every other day.       . beta carotene w/minerals (OCUVITE) tablet Take 1 tablet by mouth daily.      Marland Kitchen losartan-hydrochlorothiazide (HYZAAR) 100-25 MG per tablet TAKE ONE TABLET BY MOUTH DAILY.  90 tablet  3   No current facility-administered medications for this visit.    Allergies-reviewed and updated Allergies  Allergen Reactions  . Betadine [Povidone Iodine]     blistering  . Povidone-Iodine     REACTION: blisters    History   Social History  . Marital Status: Married    Spouse Name: N/A    Number of Children: N/A  . Years of Education: N/A   Social  History Main Topics  . Smoking status: Former Smoker    Quit date: 03/10/1981  . Smokeless tobacco: None  . Alcohol Use: Yes     Comment: RARE  . Drug Use: No  . Sexual Activity: None   Other Topics Concern  . None   Social History Narrative   [employer], [place].   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 Oropharynx normal Neck: no thyromegaly, no lymphadenopathy CV: RRR no murmurs rubs or gallops, no JVD Lungs: CTAB no crackles, wheeze, rhonchi Abdomen: soft/nontender/nondistended/normal bowel sounds. Multiple scars from prior surgeries.  Ext: 1+ edema Skin: warm, dry, 2+ radial pulses Neuro: grossly normal, moves all extremities, PERRLA  Assessment/Plan:  Other malaise and fatigue Doubt cardiac-no chest pain or dyspnea on exertion or orthopnea or PND. Could be sleep apnea but not a lot of snoring and no recent weight gain. CBC-evaluate for anemia or potential leukemia with family history, TSH. Could be low b12. Start with basic labs. Follow up 3 months unless symptoms worsen if labs normal. Patient up to date on cancer screening. Has regular follow up with urology for known prostate cancer and has had stable PSA. "stomach cancer" history unclear but no current abdominal complaints.    nonfasting Orders Placed This Encounter  Procedures  . Comprehensive metabolic panel    [place]  . Vitamin B12  . LDL cholesterol, direct    [place]  . TSH    [place]  . CBC with  Differential  likely no statin even if mild elevation of LDL

## 2013-12-02 ENCOUNTER — Other Ambulatory Visit: Payer: Self-pay | Admitting: Family Medicine

## 2013-12-02 MED ORDER — AMLODIPINE BESYLATE 5 MG PO TABS: 5.0000 mg | ORAL_TABLET | Freq: Every day | ORAL | Status: DC

## 2013-12-02 MED ORDER — LOSARTAN POTASSIUM 100 MG PO TABS
100.0000 mg | ORAL_TABLET | Freq: Every day | ORAL | Status: DC
Start: 2013-12-02 — End: 2014-02-27

## 2013-12-03 ENCOUNTER — Encounter: Payer: Self-pay | Admitting: Family Medicine

## 2013-12-05 ENCOUNTER — Other Ambulatory Visit: Payer: Self-pay | Admitting: Family Medicine

## 2013-12-05 MED ORDER — AMLODIPINE BESYLATE 10 MG PO TABS: 10.0000 mg | ORAL_TABLET | Freq: Every day | ORAL | Status: DC

## 2013-12-16 ENCOUNTER — Encounter: Payer: Self-pay | Admitting: Family Medicine

## 2013-12-16 ENCOUNTER — Ambulatory Visit (INDEPENDENT_AMBULATORY_CARE_PROVIDER_SITE_OTHER): Payer: Medicare Other | Admitting: Family Medicine

## 2013-12-16 VITALS — BP 138/68 | Temp 98.1°F | Wt 226.0 lb

## 2013-12-16 DIAGNOSIS — R5382 Chronic fatigue, unspecified: Secondary | ICD-10-CM

## 2013-12-16 DIAGNOSIS — I10 Essential (primary) hypertension: Secondary | ICD-10-CM

## 2013-12-16 LAB — COMPREHENSIVE METABOLIC PANEL
ALBUMIN: 3.2 g/dL — AB (ref 3.5–5.2)
ALT: 11 U/L (ref 0–53)
AST: 13 U/L (ref 0–37)
Alkaline Phosphatase: 81 U/L (ref 39–117)
BUN: 21 mg/dL (ref 6–23)
CALCIUM: 10.6 mg/dL — AB (ref 8.4–10.5)
CO2: 29 meq/L (ref 19–32)
Chloride: 104 mEq/L (ref 96–112)
Creatinine, Ser: 0.9 mg/dL (ref 0.4–1.5)
GFR: 85.74 mL/min (ref >60.00)
Glucose, Bld: 78 mg/dL (ref 70–99)
POTASSIUM: 3.7 meq/L (ref 3.5–5.1)
Sodium: 137 mEq/L (ref 135–145)
Total Bilirubin: 1.1 mg/dL (ref 0.2–1.2)
Total Protein: 7.1 g/dL (ref 6.0–8.3)

## 2013-12-16 LAB — CBC WITH DIFFERENTIAL/PLATELET
BASOS ABS: 0 10*3/uL (ref 0.0–0.1)
BASOS PCT: 0.1 % (ref 0.0–3.0)
Eosinophils Absolute: 0.2 10*3/uL (ref 0.0–0.7)
Eosinophils Relative: 1.9 % (ref 0.0–5.0)
HCT: 45 % (ref 39.0–52.0)
HEMOGLOBIN: 14.8 g/dL (ref 13.0–17.0)
LYMPHS PCT: 11.4 % — AB (ref 12.0–46.0)
Lymphs Abs: 1.1 10*3/uL (ref 0.7–4.0)
MCHC: 32.9 g/dL (ref 30.0–36.0)
MCV: 97.9 fl (ref 78.0–100.0)
MONOS PCT: 8.3 % (ref 3.0–12.0)
Monocytes Absolute: 0.8 10*3/uL (ref 0.1–1.0)
NEUTROS ABS: 7.4 10*3/uL (ref 1.4–7.7)
NEUTROS PCT: 78.3 % — AB (ref 43.0–77.0)
Platelets: 293 10*3/uL (ref 150.0–400.0)
RBC: 4.6 Mil/uL (ref 4.22–5.81)
RDW: 16.2 % — AB (ref 11.5–15.5)
WBC: 9.5 10*3/uL (ref 4.0–10.5)

## 2013-12-16 LAB — SEDIMENTATION RATE: Sed Rate: 49 mm/hr — ABNORMAL HIGH (ref 0–22)

## 2013-12-16 NOTE — Assessment & Plan Note (Signed)
Well-controlled despite stopping hydrochlorothiazide. Continue amlodipine 10 mg losartan 100 mg. HCTZ stopped due to hypercalcemia

## 2013-12-16 NOTE — Progress Notes (Signed)
  Garret Reddish, MD Phone: [phone]  Subjective:   Jose Ware is a 79 y.o. year old very pleasant male patient who presents with the following:  Hypertension-well controlled on valsartan and amlodipine alone  BP Readings from Last 3 Encounters:  12/16/13 138/68  12/01/13 144/78  03/24/13 130/70   HCTZ was stopped due to hypercalcemia Compliant with medications-yes without side effects ROS-Denies any CP, HA, SOB, blurry vision, LE edema.  Fatigue- stable does admit to snoring if sleeps on one side. Has sleepiness if bored watching tv but otherwise stays awake. Fatigue is been stable over last 2 weeks. ROS-mild RLQ abdominal pain a few days last week now resolved  Past Medical History- Patient Active Problem List   Diagnosis Date Noted  . ADENOCARCINOMA, PROSTATE 12/14/2008    Priority: High  . CKD (chronic kidney disease), stage II 12/01/2013    Priority: Medium  . Essential hypertension 11/27/2006    Priority: Medium  . History of stomach cancer 12/01/2013    Priority: Low  . Lower back pain 03/24/2013    Priority: Low  . Bradycardia 09/23/2012    Priority: Low  . Bruising 09/23/2012    Priority: Low  . B12 deficiency 09/17/2010    Priority: Low  . COLONIC POLYPS, HX OF 12/14/2008    Priority: Low  . PERIPHERAL NEUROPATHY 10/18/2007    Priority: Low  . PSORIASIS 10/15/2007    Priority: Low  . DEGENERATIVE JOINT DISEASE 11/27/2006    Priority: Low  . NEPHROLITHIASIS, HX OF 11/27/2006    Priority: Low  . Fatigue 12/01/2013   Medications- reviewed and updated Current Outpatient Prescriptions  Medication Sig Dispense Refill  . amLODipine (NORVASC) 10 MG tablet Take 1 tablet (10 mg total) by mouth daily.  30 tablet  5  . aspirin 81 MG tablet Take 81 mg by mouth every other day.       . beta carotene w/minerals (OCUVITE) tablet Take 1 tablet by mouth daily.      Marland Kitchen losartan (COZAAR) 100 MG tablet Take 1 tablet (100 mg total) by mouth daily.  30 tablet  3    No current facility-administered medications for this visit.    Objective: BP 138/68  Temp(Src) 98.1 F (36.7 C)  Wt 226 lb (102.513 kg) Gen: NAD, resting comfortably on table CV: RRR no murmurs rubs or gallops Lungs: CTAB no crackles, wheeze, rhonchi Abdomen: soft/nontender/nondistended/normal bowel sounds. No rebound or guarding. Surgical scars noted Skin: warm, dry, no rash Neuro: grossly normal, moves all extremities   Assessment/Plan:  Essential hypertension Well-controlled despite stopping hydrochlorothiazide. Continue amlodipine 10 mg losartan 100 mg. HCTZ stopped due to hypercalcemia  Fatigue Hypercalcemia was likely due to hydrochlorothiazide. This was stopped so repeat CMET today. Also add ESR due to concern for potential malignancy. CBC to recheck elevated white count. Cannot find the study but patient states had sleep apnea study which was negative within the last few years.  Return precautions advised.   Orders Placed This Encounter  Procedures  . Comprehensive metabolic panel    [place]  . CBC with Differential  . Sedimentation rate    [place]

## 2013-12-16 NOTE — Patient Instructions (Addendum)
Stop at lab  Blood pressure is fine without HCTZ continue 2 other meds  Health Maintenance Due  Topic Date Due  . Tetanus/tdap - at 3 month follow up 03/11/2013

## 2013-12-16 NOTE — Assessment & Plan Note (Addendum)
Hypercalcemia was likely due to hydrochlorothiazide. This was stopped so repeat CMET today. Also add ESR due to concern for potential malignancy. CBC to recheck elevated white count. Cannot find the study but patient states had sleep apnea study which was negative within the last few years.

## 2013-12-19 ENCOUNTER — Other Ambulatory Visit: Payer: Medicare Other

## 2013-12-19 ENCOUNTER — Telehealth: Payer: Self-pay

## 2013-12-19 DIAGNOSIS — R739 Hyperglycemia, unspecified: Secondary | ICD-10-CM

## 2013-12-19 NOTE — Telephone Encounter (Signed)
Called pt to have him come in for calcium lab work

## 2013-12-19 NOTE — Telephone Encounter (Signed)
error 

## 2013-12-20 LAB — CALCIUM, IONIZED: CALCIUM ION: 1.55 mmol/L — AB (ref 1.12–1.32)

## 2013-12-21 LAB — PTH, INTACT AND CALCIUM
CALCIUM: 10.7 mg/dL — AB (ref 8.4–10.5)
PTH: 135 pg/mL — ABNORMAL HIGH (ref 14–64)

## 2013-12-22 ENCOUNTER — Encounter: Payer: Self-pay | Admitting: Family Medicine

## 2013-12-28 ENCOUNTER — Encounter: Payer: Self-pay | Admitting: Family Medicine

## 2013-12-28 ENCOUNTER — Ambulatory Visit (INDEPENDENT_AMBULATORY_CARE_PROVIDER_SITE_OTHER): Payer: Medicare Other | Admitting: Family Medicine

## 2013-12-28 VITALS — BP 150/72 | HR 60 | Temp 98.3°F | Wt 222.0 lb

## 2013-12-28 DIAGNOSIS — R1900 Intra-abdominal and pelvic swelling, mass and lump, unspecified site: Secondary | ICD-10-CM

## 2013-12-28 DIAGNOSIS — E210 Primary hyperparathyroidism: Secondary | ICD-10-CM | POA: Insufficient documentation

## 2013-12-28 DIAGNOSIS — Z8639 Personal history of other endocrine, nutritional and metabolic disease: Secondary | ICD-10-CM | POA: Insufficient documentation

## 2013-12-28 NOTE — Assessment & Plan Note (Signed)
Send to general surgery for evaluation of potential parathyroidectomy. Patient is not clearly symptomatic but his fatigue over at least last 6 months could be symptom. We discussed workup with DEXA, urine calcium, vit D, x-ray abdomen to evaluate nephrolithiasis as these could lead to clear indication for surgery but with risks of osteoporosis, kidney stone formation and fact patient's fatigue may be a symptom-we opted for referral to general surgery with Dr. Harlow Asa.

## 2013-12-28 NOTE — Patient Instructions (Signed)
Send for CT of the abdomen (they will call you) to better evaluate "walnut"  Send to ear nose and throat or general surgery for parathyroid surgery discussion.

## 2013-12-28 NOTE — Progress Notes (Signed)
Garret Reddish, MD Phone: [phone]  Subjective:   Jose Ware is a 79 y.o. year old very pleasant male patient who presents with the following:  Abdominal mass Found 1 week ago at bottom of xiphoid or below. Never felt this before or seen before. Noticed it just looking at it and wife confirmed that this has never been present. Seemed to be larger at first than currently is but also just seems larger with breathing out so may have been positional. No pain. No itching or burning. Does not seem to be growing in size. Nontender.  ROS- no fever/chills.nausea/vomiting  Primary hyperparathyroidism Elevated calcium with elevated PTH noted on recent labs. Not clearly symptomatic-but has had fatigue over last at least 6 months would could possibly be symptom.  ROS-no kidney stone pain, no bone pain, no recent fractures  Past Medical History- Patient Active Problem List   Diagnosis Date Noted  . Primary hyperparathyroidism 12/28/2013    Priority: High  . ADENOCARCINOMA, PROSTATE 12/14/2008    Priority: High  . CKD (chronic kidney disease), stage II 12/01/2013    Priority: Medium  . Essential hypertension 11/27/2006    Priority: Medium  . History of stomach cancer 12/01/2013    Priority: Low  . Lower back pain 03/24/2013    Priority: Low  . Bradycardia 09/23/2012    Priority: Low  . Bruising 09/23/2012    Priority: Low  . B12 deficiency 09/17/2010    Priority: Low  . COLONIC POLYPS, HX OF 12/14/2008    Priority: Low  . PERIPHERAL NEUROPATHY 10/18/2007    Priority: Low  . PSORIASIS 10/15/2007    Priority: Low  . DEGENERATIVE JOINT DISEASE 11/27/2006    Priority: Low  . NEPHROLITHIASIS, HX OF 11/27/2006    Priority: Low  . Fatigue 12/01/2013   Medications- reviewed and updated Current Outpatient Prescriptions  Medication Sig Dispense Refill  . amLODipine (NORVASC) 10 MG tablet Take 1 tablet (10 mg total) by mouth daily.  30 tablet  5  . aspirin 81 MG tablet Take 81  mg by mouth every other day.       . beta carotene w/minerals (OCUVITE) tablet Take 1 tablet by mouth daily.      Marland Kitchen losartan (COZAAR) 100 MG tablet Take 1 tablet (100 mg total) by mouth daily.  30 tablet  3   No current facility-administered medications for this visit.    Objective: BP 150/72  Pulse 60  Temp(Src) 98.3 F (36.8 C)  Wt 222 lb (100.699 kg) Gen: NAD, resting comfortably No thyromegaly  Approximately walnut size slightly mobile mass just at the bottom of where xiphoid would be expected to be (just above indentation in picture).     Assessment/Plan:  Abdominal mass Send for CT abdomen with contrast to further evaluate.   Primary hyperparathyroidism Send to general surgery for evaluation of potential parathyroidectomy. Patient is not clearly symptomatic but his fatigue over at least last 6 months could be symptom. We discussed workup with DEXA, urine calcium, vit D, x-ray abdomen to evaluate nephrolithiasis as these could lead to clear indication for surgery but with risks of osteoporosis, kidney stone formation and fact patient's fatigue may be a symptom-we opted for referral to general surgery with Dr. Harlow Asa.   Return precautions advised.   Orders Placed This Encounter  Procedures  . CT Abdomen Pelvis W Contrast    RM/DEBRA [phone]/PT NOT DIAB/BUN 21 / CREATININE 0.9/[employer] MEDICARE INS PC PENDING/PT TO DRINK 2 BTLS CM (PT AWARE SPOKE 12/28/13)  Standing Status: Future     Number of Occurrences:      Standing Expiration Date: 03/31/2015    Order Specific Question:  Reason for Exam (SYMPTOM  OR DIAGNOSIS REQUIRED)    Answer:  walnut size mass around end of xiphoid (never noted before by patient or on exam). Also known hyperparathyroidism    Order Specific Question:  Preferred imaging location?    Answer:  [facility]  . Ambulatory referral to General Surgery    Referral Priority:  Routine    Referral Type:  Surgical    Referral Reason:  Specialty  Services Required    Referred to Provider:  Armandina Gemma, MD    Requested Specialty:  General Surgery    Number of Visits Requested:  1

## 2013-12-30 ENCOUNTER — Other Ambulatory Visit: Payer: Medicare Other

## 2014-01-03 ENCOUNTER — Ambulatory Visit (INDEPENDENT_AMBULATORY_CARE_PROVIDER_SITE_OTHER)
Admission: RE | Admit: 2014-01-03 | Discharge: 2014-01-03 | Disposition: A | Discharge status: Home / Self Care | Payer: Medicare Other | Source: Ambulatory Visit | Attending: Family Medicine | Admitting: Family Medicine

## 2014-01-03 DIAGNOSIS — R1900 Intra-abdominal and pelvic swelling, mass and lump, unspecified site: Secondary | ICD-10-CM

## 2014-01-03 MED ORDER — IOHEXOL 300 MG/ML  SOLN
100.0000 mL | Freq: Once | INTRAMUSCULAR | Status: AC | PRN
  Administered 2014-01-03: 100 mL via INTRAVENOUS

## 2014-01-05 ENCOUNTER — Encounter: Payer: Self-pay | Admitting: Family Medicine

## 2014-02-27 ENCOUNTER — Ambulatory Visit (INDEPENDENT_AMBULATORY_CARE_PROVIDER_SITE_OTHER): Payer: Medicare Other | Admitting: Family Medicine

## 2014-02-27 ENCOUNTER — Encounter: Payer: Self-pay | Admitting: Family Medicine

## 2014-02-27 VITALS — BP 110/60 | HR 64 | Temp 98.3°F | Wt 229.0 lb

## 2014-02-27 DIAGNOSIS — H01003 Unspecified blepharitis right eye, unspecified eyelid: Secondary | ICD-10-CM

## 2014-02-27 DIAGNOSIS — R5382 Chronic fatigue, unspecified: Secondary | ICD-10-CM

## 2014-02-27 DIAGNOSIS — I10 Essential (primary) hypertension: Secondary | ICD-10-CM

## 2014-02-27 DIAGNOSIS — Z23 Encounter for immunization: Secondary | ICD-10-CM

## 2014-02-27 MED ORDER — LOSARTAN POTASSIUM 100 MG PO TABS: 100.0000 mg | ORAL_TABLET | Freq: Every day | ORAL | Status: DC

## 2014-02-27 MED ORDER — ERYTHROMYCIN 5 MG/GM OP OINT: 1.0000 "application " | TOPICAL_OINTMENT | Freq: Four times a day (QID) | OPHTHALMIC | Status: DC

## 2014-02-27 NOTE — Progress Notes (Signed)
Garret Reddish, MD Phone: [phone]  Subjective:   Jose Ware is a 79 y.o. year old very pleasant male patient who presents with the following:  Hypertension-well controlled  BP Readings from Last 3 Encounters:  02/27/14 110/60  12/28/13 150/72  12/16/13 138/68   Home BP monitoring-no Compliant with medications-yes without side effects ROS-Denies any CP, HA, SOB, blurry vision, LE edema.   R eyelid redness-new X 2-3 days, started out looking black then around eyelashes turned red. No treatments tried. Itches. Eye waters more. Some crusting in AM but mild.  ROS- no vision changes, blurry vision, redness of sclera   Fatigue-chronic Pretty stable from last visit. Has not seen general surgery yet. flomax off for year. Up 2-3 x a night and wonders if difficulty getting back to sleep are contributing.  ROS- no unintentional weight loss  Past Medical History- Patient Active Problem List   Diagnosis Date Noted  . Primary hyperparathyroidism 12/28/2013    Priority: High  . Fatigue 12/01/2013    Priority: High  . ADENOCARCINOMA, PROSTATE 12/14/2008    Priority: High  . CKD (chronic kidney disease), stage II 12/01/2013    Priority: Medium  . Essential hypertension 11/27/2006    Priority: Medium  . History of stomach cancer 12/01/2013    Priority: Low  . Lower back pain 03/24/2013    Priority: Low  . Bradycardia 09/23/2012    Priority: Low  . Bruising 09/23/2012    Priority: Low  . B12 deficiency 09/17/2010    Priority: Low  . COLONIC POLYPS, HX OF 12/14/2008    Priority: Low  . PERIPHERAL NEUROPATHY 10/18/2007    Priority: Low  . PSORIASIS 10/15/2007    Priority: Low  . DEGENERATIVE JOINT DISEASE 11/27/2006    Priority: Low  . NEPHROLITHIASIS, HX OF 11/27/2006    Priority: Low   Medications- reviewed and updated Current Outpatient Prescriptions  Medication Sig Dispense Refill  . amLODipine (NORVASC) 10 MG tablet Take 1 tablet (10 mg total) by mouth  daily. 30 tablet 5  . aspirin 81 MG tablet Take 81 mg by mouth every other day.     . beta carotene w/minerals (OCUVITE) tablet Take 1 tablet by mouth daily.    Marland Kitchen erythromycin ophthalmic ointment Place 1 application into the right eye 4 (four) times daily. 3-4x a day. One-half inch (1.25 cm) four times daily for 5 to 7 days 3.5 g 0  . losartan (COZAAR) 100 MG tablet Take 1 tablet (100 mg total) by mouth daily. 90 tablet 3   No current facility-administered medications for this visit.    Objective: BP 110/60 mmHg  Pulse 64  Temp(Src) 98.3 F (36.8 C)  Wt 229 lb (103.874 kg) Gen: NAD, resting comfortably R eye: swollen upper lid with erythema especially around eyelash follicles. PERRLA, conjunctiva normal.  L eye unremarkable. Normal visual fields and acuity (in reading things in the room) CV: RRR no murmurs rubs or gallops Lungs: CTAB no crackles, wheeze, rhonchi Ext: no edema Skin: warm, dry, no rash except for erythema on right upper eyelid Neuro: grossly normal, moves all extremities   Assessment/Plan:  Essential hypertension Well controlled on Amlodipine 10, losartan 100mg .continue.   Fatigue Fatigue persists. Repeated request to surgery in regards to hyperparathyroidism as potential cause. Patient admits now he is not sleeping the best off flomax. Would consider restarting at next visit. Also-repeat labs at 3 month follow up if not done by surgery.   R eye blepharitis Warm compresses, treat with erythromycin  for at least 5 days. Return precautions advised.   Meds ordered this encounter  Medications  . losartan (COZAAR) 100 MG tablet    Sig: Take 1 tablet (100 mg total) by mouth daily.    Dispense:  90 tablet    Refill:  3  . erythromycin ophthalmic ointment    Sig: Place 1 application into the right eye 4 (four) times daily. 3-4x a day. One-half inch (1.25 cm) four times daily for 5 to 7 days    Dispense:  3.5 g    Refill:  0   Tdap given today

## 2014-02-27 NOTE — Assessment & Plan Note (Signed)
Well controlled on Amlodipine 10, losartan 100mg .continue.

## 2014-02-27 NOTE — Patient Instructions (Addendum)
Blood pressure looks great. Refilled losartan. Continue amlodipine.   We are trying to schedule your surgery visit before you leave today.   Warm compresses 3x a day and apply erythrmoycin to affected area each time.   Health Maintenance Due  Topic Date Due  . TETANUS/TDAP -today 03/11/2013   Blepharitis Blepharitis is a skin problem that makes your eyelids watery, red, puffy (swollen), crusty, scaly, or painful. It may also make your eyes itch. You may lose eyelashes. HOME CARE  Keep your hands clean.  Use a clean towel each time you dry your eyelids. Do not share towels or makeup with anyone.  Carefully wash your eyelids and eyelashes 2 times a day. Use warm water and baby shampoo or just water.  Wash your face and eyebrows at least once a day.  Hold a folded washcloth under warm water. Squeeze the water out. Put the warm washcloth on your eyes 2 times a day for 10 minutes, or as told by your doctor.  Apply medicated cream as told by your doctor.  Avoid rubbing your eyes.  Avoid wearing makeup until you get better.  Follow up with your doctor as told. GET HELP RIGHT AWAY IF:  Your pain, redness, or puffiness gets worse.  Your pain, redness, or puffiness spreads to other parts of your face.  Your vision changes, or you have pain when looking at lights or moving objects.  You have a fever.  You do not get better after 2 to 4 days. MAKE SURE YOU:  Understand these instructions.  Will watch your condition.  Will get help right away if you are not doing well or get worse. Document Released: 12/04/2007 Document Revised: 05/19/2011 Document Reviewed: 04/03/2010 [facility] Patient Information 2015 [place], [place]. This information is not intended to replace advice given to you by your health care provider. Make sure you discuss any questions you have with your health care provider.

## 2014-02-27 NOTE — Addendum Note (Signed)
Addended by: Clyde Lundborg A on: 02/27/2014 10:01 AM   Modules accepted: Orders

## 2014-02-27 NOTE — Assessment & Plan Note (Signed)
Fatigue persists. Repeated request to surgery in regards to hyperparathyroidism as potential cause. Patient admits now he is not sleeping the best off flomax. Would consider restarting at next visit. Also-repeat labs at 3 month follow up if not done by surgery.

## 2014-02-28 ENCOUNTER — Encounter: Payer: Self-pay | Admitting: Family Medicine

## 2014-03-20 ENCOUNTER — Other Ambulatory Visit (HOSPITAL_COMMUNITY): Payer: Self-pay | Admitting: Surgery

## 2014-03-20 ENCOUNTER — Other Ambulatory Visit (INDEPENDENT_AMBULATORY_CARE_PROVIDER_SITE_OTHER): Payer: Self-pay | Admitting: Surgery

## 2014-03-20 ENCOUNTER — Other Ambulatory Visit: Payer: Self-pay | Admitting: Surgery

## 2014-03-20 DIAGNOSIS — E213 Hyperparathyroidism, unspecified: Secondary | ICD-10-CM

## 2014-03-20 DIAGNOSIS — E210 Primary hyperparathyroidism: Secondary | ICD-10-CM | POA: Diagnosis not present

## 2014-03-21 ENCOUNTER — Encounter: Payer: Self-pay | Admitting: Gastroenterology

## 2014-03-21 DIAGNOSIS — E210 Primary hyperparathyroidism: Secondary | ICD-10-CM | POA: Diagnosis not present

## 2014-03-22 ENCOUNTER — Other Ambulatory Visit: Payer: Self-pay | Admitting: Surgery

## 2014-03-22 DIAGNOSIS — E213 Hyperparathyroidism, unspecified: Secondary | ICD-10-CM

## 2014-03-23 ENCOUNTER — Ambulatory Visit (HOSPITAL_COMMUNITY): Payer: Medicare Other

## 2014-03-23 ENCOUNTER — Encounter (HOSPITAL_COMMUNITY): Payer: Medicare Other

## 2014-03-24 ENCOUNTER — Encounter: Payer: Self-pay | Admitting: Gastroenterology

## 2014-03-30 ENCOUNTER — Encounter (HOSPITAL_COMMUNITY): Admission: RE | Admit: 2014-03-30 | Payer: Medicare Other | Source: Ambulatory Visit

## 2014-03-30 ENCOUNTER — Ambulatory Visit (HOSPITAL_COMMUNITY): Admission: RE | Admit: 2014-03-30 | Payer: Medicare Other | Source: Ambulatory Visit

## 2014-03-30 ENCOUNTER — Encounter (HOSPITAL_COMMUNITY)
Admission: RE | Admit: 2014-03-30 | Discharge: 2014-03-30 | Disposition: A | Discharge status: Home / Self Care | Payer: Medicare Other | Source: Ambulatory Visit | Attending: Surgery | Admitting: Surgery

## 2014-03-30 DIAGNOSIS — D351 Benign neoplasm of parathyroid gland: Secondary | ICD-10-CM | POA: Diagnosis not present

## 2014-03-30 DIAGNOSIS — E213 Hyperparathyroidism, unspecified: Secondary | ICD-10-CM | POA: Insufficient documentation

## 2014-03-30 MED ORDER — TECHNETIUM TC 99M SESTAMIBI GENERIC - CARDIOLITE
25.0000 | Freq: Once | INTRAVENOUS | Status: AC | PRN
  Administered 2014-03-30: 25 via INTRAVENOUS

## 2014-04-06 ENCOUNTER — Ambulatory Visit (INDEPENDENT_AMBULATORY_CARE_PROVIDER_SITE_OTHER): Payer: Self-pay | Admitting: Surgery

## 2014-04-06 ENCOUNTER — Other Ambulatory Visit (INDEPENDENT_AMBULATORY_CARE_PROVIDER_SITE_OTHER): Payer: Self-pay | Admitting: Surgery

## 2014-04-17 ENCOUNTER — Encounter: Payer: Self-pay | Admitting: Family Medicine

## 2014-05-03 ENCOUNTER — Other Ambulatory Visit (HOSPITAL_COMMUNITY): Payer: Self-pay | Admitting: *Deleted

## 2014-05-04 ENCOUNTER — Encounter (HOSPITAL_COMMUNITY)
Admission: RE | Admit: 2014-05-04 | Discharge: 2014-05-04 | Disposition: A | Discharge status: Home / Self Care | Payer: Medicare Other | Source: Ambulatory Visit | Attending: Surgery | Admitting: Surgery

## 2014-05-04 ENCOUNTER — Encounter (HOSPITAL_COMMUNITY): Payer: Self-pay

## 2014-05-04 DIAGNOSIS — E210 Primary hyperparathyroidism: Secondary | ICD-10-CM | POA: Insufficient documentation

## 2014-05-04 DIAGNOSIS — R001 Bradycardia, unspecified: Secondary | ICD-10-CM | POA: Insufficient documentation

## 2014-05-04 DIAGNOSIS — I10 Essential (primary) hypertension: Secondary | ICD-10-CM | POA: Diagnosis not present

## 2014-05-04 DIAGNOSIS — Z01812 Encounter for preprocedural laboratory examination: Secondary | ICD-10-CM | POA: Diagnosis not present

## 2014-05-04 DIAGNOSIS — Z01818 Encounter for other preprocedural examination: Secondary | ICD-10-CM | POA: Diagnosis not present

## 2014-05-04 HISTORY — DX: Personal history of other medical treatment: Z92.89

## 2014-05-04 HISTORY — DX: Essential (primary) hypertension: I10

## 2014-05-04 LAB — BASIC METABOLIC PANEL
Anion gap: 4 — ABNORMAL LOW (ref 5–15)
BUN: 20 mg/dL (ref 6–23)
CO2: 28 mmol/L (ref 19–32)
CREATININE: 0.94 mg/dL (ref 0.50–1.35)
Calcium: 10.7 mg/dL — ABNORMAL HIGH (ref 8.4–10.5)
Chloride: 104 mmol/L (ref 96–112)
GFR calc non Af Amer: 79 mL/min — ABNORMAL LOW (ref >90)
Glucose, Bld: 110 mg/dL — ABNORMAL HIGH (ref 70–99)
Potassium: 4.1 mmol/L (ref 3.5–5.1)
Sodium: 136 mmol/L (ref 135–145)

## 2014-05-04 LAB — CBC
HCT: 45.9 % (ref 39.0–52.0)
Hemoglobin: 15.3 g/dL (ref 13.0–17.0)
MCH: 32.4 pg (ref 26.0–34.0)
MCHC: 33.3 g/dL (ref 30.0–36.0)
MCV: 97.2 fL (ref 78.0–100.0)
PLATELETS: 253 10*3/uL (ref 150–400)
RBC: 4.72 MIL/uL (ref 4.22–5.81)
RDW: 14.8 % (ref 11.5–15.5)
WBC: 11 10*3/uL — AB (ref 4.0–10.5)

## 2014-05-04 NOTE — Pre-Procedure Instructions (Signed)
Jose Ware  05/04/2014   Your procedure is scheduled on:  05/12/2014  Report to [facility] Admitting at 5:30 AM.  Call this number if you have problems the morning of surgery: [phone]   Remember:   Do not eat food or drink liquids after midnight.  On Thursday   Take these medicines the morning of surgery with A SIP OF WATER: Amlodipine    Do not wear jewelry  Do not wear lotions, powders, or perfumes. You may wear deodorant.   Men may shave face and neck.  Do not bring valuables to the hospital.  [facility] is not responsible                  for any belongings or valuables.               Contacts, dentures or bridgework may not be worn into surgery.  Leave suitcase in the car. After surgery it may be brought to your room.  For patients admitted to the hospital, discharge time is determined by your                treatment team.               Patients discharged the day of surgery will not be allowed to drive  home.  Name and phone number of your driver: /w wife   Special Instructions: Special Instructions: [place] - Preparing for Surgery  Before surgery, you can play an important role.  Because skin is not sterile, your skin needs to be as free of germs as possible.  You can reduce the number of germs on you skin by washing with CHG (chlorahexidine gluconate) soap before surgery.  CHG is an antiseptic cleaner which kills germs and bonds with the skin to continue killing germs even after washing.  Please DO NOT use if you have an allergy to CHG or antibacterial soaps.  If your skin becomes reddened/irritated stop using the CHG and inform your nurse when you arrive at Short Stay.  Do not shave (including legs and underarms) for at least 48 hours prior to the first CHG shower.  You may shave your face.  Please follow these instructions carefully:   1.  Shower with CHG Soap the night before surgery and the  morning of Surgery.  2.  If you choose to wash your  hair, wash your hair first as usual with your  normal shampoo.  3.  After you shampoo, rinse your hair and body thoroughly to remove the  Shampoo.  4.  Use CHG as you would any other liquid soap.  You can apply chg directly to the skin and wash gently with scrungie or a clean washcloth.  5.  Apply the CHG Soap to your body ONLY FROM THE NECK DOWN.    Do not use on open wounds or open sores.  Avoid contact with your eyes, ears, mouth and genitals (private parts).  Wash genitals (private parts)   with your normal soap.  6.  Wash thoroughly, paying special attention to the area where your surgery will be performed.  7.  Thoroughly rinse your body with warm water from the neck down.  8.  DO NOT shower/wash with your normal soap after using and rinsing off   the CHG Soap.  9.  Pat yourself dry with a clean towel.            10.  Wear clean pajamas.  11.  Place clean sheets on your bed the night of your first shower and do not sleep with pets.  Day of Surgery  Do not apply any lotions/deodorants the morning of surgery.  Please wear clean clothes to the hospital/surgery center.   Please read over the following fact sheets that you were given: Pain Booklet, Coughing and Deep Breathing and Surgical Site Infection Prevention

## 2014-05-04 NOTE — Progress Notes (Signed)
Quick Note:  EKG is acceptable for scheduled surgery.  Honor Fairbank M. Empress Newmann, MD, [facility] Office: [phone]   ______ 

## 2014-05-04 NOTE — Progress Notes (Signed)
Quick Note:  These results are acceptable for scheduled surgery.  Bryanne Riquelme M. Aryella Besecker, MD, FACS [facility] Office: [phone]   ______ 

## 2014-05-04 NOTE — Progress Notes (Signed)
Quick Note:  These results are acceptable for scheduled surgery.  Jamieson Hetland M. Khanh Tanori, MD, FACS [facility] Office: [phone]   ______ 

## 2014-05-05 NOTE — Progress Notes (Signed)
Quick Note:  EKG is acceptable for scheduled surgery.  Sari Cogan M. Shekelia Boutin, MD, FACS [facility] Office: [phone]   ______ 

## 2014-05-09 ENCOUNTER — Encounter: Payer: Medicare Other | Admitting: Gastroenterology

## 2014-05-11 ENCOUNTER — Encounter (HOSPITAL_COMMUNITY): Payer: Self-pay | Admitting: Surgery

## 2014-05-11 MED ORDER — CEFAZOLIN SODIUM-DEXTROSE 2-3 GM-% IV SOLR
2.0000 g | INTRAVENOUS | Status: AC
  Administered 2014-05-12: 2 g via INTRAVENOUS
  Filled 2014-05-11: qty 50

## 2014-05-11 NOTE — H&P (Signed)
General Surgery [facility]  Jose Ware DOB: Mar 19, 1934 Married / Language: English / Race: White Male  History of Present Illness   The patient is a 80 year old male who presents with a parathyroid neoplasm. Patient is referred by Dr. Garret Reddish for evaluation of suspected primary hyperparathyroidism. Patient had presented with fatigue and concerns over a family history of leukemia. Laboratory studies over the past few years have shown a slowly rising serum calcium level ranging from 10.0 up to 11.0. Recent laboratory studies show a calcium of 10.6 with an intact PTH level of 135. Patient denies any history of nephrolithiasis or recent fractures. There is no family history of parathyroid disease and no family history of other endocrine neoplasms. Patient has had a tonsillectomy but otherwise no head or neck surgery. He presents today for further evaluation and recommendations for management. Patient has not had a bone density scan. He has not done a 24-hour urine collection.   Other Problems  Arthritis Back Pain Diverticulosis Enlarged Prostate High blood pressure Prostate Cancer  Past Surgical History  Appendectomy Cataract Surgery Bilateral. Colon Polyp Removal - Colonoscopy Colon Removal - Partial Knee Surgery Bilateral. Oral Surgery Resection of Stomach Tonsillectomy  Diagnostic Studies History Colonoscopy >10 years ago  Allergies Betadine *ANTISEPTICS & DISINFECTANTS*  Medication History AmLODIPine Besylate (Oral) Specific dose unknown - Active. Aspirin EC (Oral) Specific dose unknown - Active. Losartan Potassium (Oral) Specific dose unknown - Active. B12-Active (Oral) Specific dose unknown - Active.  Social History Alcohol use Occasional alcohol use. Caffeine use Carbonated beverages, Coffee, Tea. No drug use Tobacco use Former smoker.  Family History  Alcohol Abuse Father. Arthritis  Mother. Bleeding disorder Brother. Diabetes Mellitus Sister. Heart Disease Mother. Hypertension Mother.  Review of Systems  General Present- Fatigue. Not Present- Appetite Loss, Chills, Fever, Night Sweats, Weight Gain and Weight Loss. Skin Not Present- Change in Wart/Mole, Dryness, Hives, Jaundice, New Lesions, Non-Healing Wounds, Rash and Ulcer. HEENT Present- Wears glasses/contact lenses. Not Present- Earache, Hearing Loss, Hoarseness, Nose Bleed, Oral Ulcers, Ringing in the Ears, Seasonal Allergies, Sinus Pain, Sore Throat, Visual Disturbances and Yellow Eyes. Respiratory Not Present- Bloody sputum, Chronic Cough, Difficulty Breathing, Snoring and Wheezing. Breast Not Present- Breast Mass, Breast Pain, Nipple Discharge and Skin Changes. Cardiovascular Not Present- Chest Pain, Difficulty Breathing Lying Down, Leg Cramps, Palpitations, Rapid Heart Rate, Shortness of Breath and Swelling of Extremities. Gastrointestinal Not Present- Abdominal Pain, Bloating, Bloody Stool, Change in Bowel Habits, Chronic diarrhea, Constipation, Difficulty Swallowing, Excessive gas, Gets full quickly at meals, Hemorrhoids, Indigestion, Nausea, Rectal Pain and Vomiting. Male Genitourinary Present- Impotence, Nocturia and Urine Leakage. Not Present- Blood in Urine, Change in Urinary Stream, Frequency, Painful Urination and Urgency. Musculoskeletal Present- Back Pain and Joint Pain. Not Present- Joint Stiffness, Muscle Pain, Muscle Weakness and Swelling of Extremities. Neurological Present- Tremor and Weakness. Not Present- Decreased Memory, Fainting, Headaches, Numbness, Seizures, Tingling and Trouble walking. Hematology Present- Easy Bruising. Not Present- Excessive bleeding, Gland problems, HIV and Persistent Infections.   Vitals  03/20/2014 10:51 AM Weight: 230 lb Height: 73in Body Surface Area: 2.32 m Body Mass Index: 30.34 kg/m Temp.: 96.30F(Oral)  Pulse: 68 (Regular)  Resp.: 18 (Unlabored)   BP: 150/76 (Sitting, Left Arm, Standard)    Physical Exam   General - appears comfortable, no distress; not diaphorectic  HEENT - normocephalic; sclerae clear, gaze conjugate; mucous membranes moist, dentition good; voice normal  Neck - symmetric on extension; no palpable anterior or posterior cervical adenopathy; no palpable  masses in the thyroid bed  Chest - clear bilaterally with rhonchi, rales, or wheeze  Cor - regular rhythm with normal rate; no significant murmur  Ext - non-tender with mild bilateral ankle edema  Neuro - grossly intact; no tremor at rest    Assessment & Plan  PRIMARY HYPERPARATHYROIDISM (252.01  E21.0)  Patient presents with signs and symptoms of primary hyperparathyroidism. I provided him with written literature on parathyroid surgery to review at home with his wife.  Nuclear med parathyroid scan localizes at least one parathyroid adenoma to the left side, possibly two glands.  Patient discussed parathyroid surgery. We discussed the location of the surgical wound. We discussed postoperative recovery. We discussed options for management including minimally invasive surgery versus 4 gland exploration. Patient understands.  The risks and benefits of the procedure have been discussed at length with the patient.  The patient understands the proposed procedure, potential alternative treatments, and the course of recovery to be expected.  All of the patient's questions have been answered at this time.  The patient wishes to proceed with surgery.  Earnstine Regal, MD, [facility] Office: [phone]

## 2014-05-12 ENCOUNTER — Encounter (HOSPITAL_COMMUNITY): Payer: Self-pay | Admitting: *Deleted

## 2014-05-12 ENCOUNTER — Observation Stay (HOSPITAL_COMMUNITY)
Admission: RE | Admit: 2014-05-12 | Discharge: 2014-05-13 | Disposition: A | Discharge status: Home / Self Care | Payer: Medicare Other | Source: Ambulatory Visit | Attending: Surgery | Admitting: Surgery

## 2014-05-12 ENCOUNTER — Ambulatory Visit (HOSPITAL_COMMUNITY): Payer: Medicare Other | Admitting: Certified Registered"

## 2014-05-12 ENCOUNTER — Encounter (HOSPITAL_COMMUNITY)
Admission: RE | Disposition: A | Discharge status: Home / Self Care | Payer: Self-pay | Source: Ambulatory Visit | Attending: Surgery

## 2014-05-12 DIAGNOSIS — E210 Primary hyperparathyroidism: Secondary | ICD-10-CM | POA: Diagnosis not present

## 2014-05-12 DIAGNOSIS — Z8546 Personal history of malignant neoplasm of prostate: Secondary | ICD-10-CM | POA: Diagnosis not present

## 2014-05-12 DIAGNOSIS — Z86010 Personal history of colonic polyps: Secondary | ICD-10-CM | POA: Insufficient documentation

## 2014-05-12 DIAGNOSIS — F1590 Other stimulant use, unspecified, uncomplicated: Secondary | ICD-10-CM | POA: Diagnosis not present

## 2014-05-12 DIAGNOSIS — F1099 Alcohol use, unspecified with unspecified alcohol-induced disorder: Secondary | ICD-10-CM | POA: Insufficient documentation

## 2014-05-12 DIAGNOSIS — Z9842 Cataract extraction status, left eye: Secondary | ICD-10-CM | POA: Diagnosis not present

## 2014-05-12 DIAGNOSIS — Z8639 Personal history of other endocrine, nutritional and metabolic disease: Secondary | ICD-10-CM | POA: Diagnosis present

## 2014-05-12 DIAGNOSIS — Z7982 Long term (current) use of aspirin: Secondary | ICD-10-CM | POA: Diagnosis not present

## 2014-05-12 DIAGNOSIS — Z903 Acquired absence of stomach [part of]: Secondary | ICD-10-CM | POA: Diagnosis not present

## 2014-05-12 DIAGNOSIS — Z9841 Cataract extraction status, right eye: Secondary | ICD-10-CM | POA: Insufficient documentation

## 2014-05-12 DIAGNOSIS — Z9049 Acquired absence of other specified parts of digestive tract: Secondary | ICD-10-CM | POA: Diagnosis not present

## 2014-05-12 DIAGNOSIS — Z881 Allergy status to other antibiotic agents status: Secondary | ICD-10-CM | POA: Diagnosis not present

## 2014-05-12 DIAGNOSIS — Z87891 Personal history of nicotine dependence: Secondary | ICD-10-CM | POA: Diagnosis not present

## 2014-05-12 DIAGNOSIS — I10 Essential (primary) hypertension: Secondary | ICD-10-CM | POA: Diagnosis not present

## 2014-05-12 DIAGNOSIS — D351 Benign neoplasm of parathyroid gland: Secondary | ICD-10-CM | POA: Diagnosis not present

## 2014-05-12 HISTORY — PX: PARATHYROIDECTOMY: SHX19

## 2014-05-12 SURGERY — PARATHYROIDECTOMY
Anesthesia: General

## 2014-05-12 MED ORDER — HYDROCODONE-ACETAMINOPHEN 5-325 MG PO TABS
1.0000 | ORAL_TABLET | ORAL | Status: DC | PRN
  Administered 2014-05-12 (×3): 1 via ORAL
  Filled 2014-05-12 (×2): qty 1

## 2014-05-12 MED ORDER — HYDROMORPHONE HCL 1 MG/ML IJ SOLN
1.0000 mg | INTRAMUSCULAR | Status: DC | PRN
  Administered 2014-05-12 – 2014-05-13 (×2): 1 mg via INTRAVENOUS
  Filled 2014-05-12 (×2): qty 1

## 2014-05-12 MED ORDER — MIDAZOLAM HCL 5 MG/5ML IJ SOLN
INTRAMUSCULAR | Status: DC | PRN
  Administered 2014-05-12: 1 mg via INTRAVENOUS

## 2014-05-12 MED ORDER — AMLODIPINE BESYLATE 10 MG PO TABS
10.0000 mg | ORAL_TABLET | Freq: Every day | ORAL | Status: DC
  Filled 2014-05-12: qty 1

## 2014-05-12 MED ORDER — ONDANSETRON HCL 4 MG/2ML IJ SOLN
INTRAMUSCULAR | Status: AC
  Filled 2014-05-12: qty 2

## 2014-05-12 MED ORDER — ARTIFICIAL TEARS OP OINT
TOPICAL_OINTMENT | OPHTHALMIC | Status: AC
  Filled 2014-05-12: qty 3.5

## 2014-05-12 MED ORDER — ONDANSETRON HCL 4 MG/2ML IJ SOLN
INTRAMUSCULAR | Status: DC | PRN
  Administered 2014-05-12: 4 mg via INTRAVENOUS

## 2014-05-12 MED ORDER — ROCURONIUM BROMIDE 100 MG/10ML IV SOLN
INTRAVENOUS | Status: DC | PRN
  Administered 2014-05-12: 40 mg via INTRAVENOUS

## 2014-05-12 MED ORDER — ROCURONIUM BROMIDE 50 MG/5ML IV SOLN
INTRAVENOUS | Status: AC
  Filled 2014-05-12: qty 1

## 2014-05-12 MED ORDER — HYDROMORPHONE HCL 1 MG/ML IJ SOLN
INTRAMUSCULAR | Status: AC
  Filled 2014-05-12: qty 1

## 2014-05-12 MED ORDER — 0.9 % SODIUM CHLORIDE (POUR BTL) OPTIME
TOPICAL | Status: DC | PRN
  Administered 2014-05-12: 1000 mL

## 2014-05-12 MED ORDER — MEPERIDINE HCL 25 MG/ML IJ SOLN: 6.2500 mg | INTRAMUSCULAR | Status: DC | PRN

## 2014-05-12 MED ORDER — HYDROMORPHONE HCL 1 MG/ML IJ SOLN
0.2500 mg | INTRAMUSCULAR | Status: DC | PRN
  Administered 2014-05-12: 0.5 mg via INTRAVENOUS

## 2014-05-12 MED ORDER — FENTANYL CITRATE 0.05 MG/ML IJ SOLN
INTRAMUSCULAR | Status: DC | PRN
  Administered 2014-05-12: 100 ug via INTRAVENOUS
  Administered 2014-05-12 (×3): 50 ug via INTRAVENOUS

## 2014-05-12 MED ORDER — ONDANSETRON HCL 4 MG/2ML IJ SOLN: 4.0000 mg | Freq: Four times a day (QID) | INTRAMUSCULAR | Status: DC | PRN

## 2014-05-12 MED ORDER — HYDROCODONE-ACETAMINOPHEN 5-325 MG PO TABS
ORAL_TABLET | ORAL | Status: AC
  Administered 2014-05-12: 1 via ORAL
  Filled 2014-05-12: qty 1

## 2014-05-12 MED ORDER — LIDOCAINE HCL (CARDIAC) 20 MG/ML IV SOLN
INTRAVENOUS | Status: DC | PRN
  Administered 2014-05-12: 100 mg via INTRAVENOUS

## 2014-05-12 MED ORDER — PROPOFOL 10 MG/ML IV BOLUS
INTRAVENOUS | Status: AC
  Filled 2014-05-12: qty 20

## 2014-05-12 MED ORDER — KCL IN DEXTROSE-NACL 20-5-0.45 MEQ/L-%-% IV SOLN
INTRAVENOUS | Status: DC
  Administered 2014-05-12: 14:00:00 via INTRAVENOUS
  Filled 2014-05-12 (×2): qty 1000

## 2014-05-12 MED ORDER — ROCURONIUM BROMIDE 50 MG/5ML IV SOLN
INTRAVENOUS | Status: AC
  Filled 2014-05-12: qty 2

## 2014-05-12 MED ORDER — SUCCINYLCHOLINE CHLORIDE 20 MG/ML IJ SOLN
INTRAMUSCULAR | Status: AC
  Filled 2014-05-12: qty 1

## 2014-05-12 MED ORDER — ARTIFICIAL TEARS OP OINT
TOPICAL_OINTMENT | OPHTHALMIC | Status: DC | PRN
Start: 2014-05-12 — End: 2014-05-12
  Administered 2014-05-12: 1 via OPHTHALMIC

## 2014-05-12 MED ORDER — GLYCOPYRROLATE 0.2 MG/ML IJ SOLN
INTRAMUSCULAR | Status: DC | PRN
Start: 2014-05-12 — End: 2014-05-12
  Administered 2014-05-12: 0.6 mg via INTRAVENOUS

## 2014-05-12 MED ORDER — SODIUM CHLORIDE 0.9 % IJ SOLN
INTRAMUSCULAR | Status: AC
  Filled 2014-05-12: qty 10

## 2014-05-12 MED ORDER — BUPIVACAINE HCL (PF) 0.25 % IJ SOLN
INTRAMUSCULAR | Status: AC
  Filled 2014-05-12: qty 30

## 2014-05-12 MED ORDER — EPHEDRINE SULFATE 50 MG/ML IJ SOLN
INTRAMUSCULAR | Status: AC
  Filled 2014-05-12: qty 1

## 2014-05-12 MED ORDER — EPHEDRINE SULFATE 50 MG/ML IJ SOLN
INTRAMUSCULAR | Status: DC | PRN
  Administered 2014-05-12 (×3): 5 mg via INTRAVENOUS

## 2014-05-12 MED ORDER — NEOSTIGMINE METHYLSULFATE 10 MG/10ML IV SOLN
INTRAVENOUS | Status: AC
  Filled 2014-05-12: qty 1

## 2014-05-12 MED ORDER — ONDANSETRON HCL 4 MG/2ML IJ SOLN: 4.0000 mg | Freq: Once | INTRAMUSCULAR | Status: DC | PRN

## 2014-05-12 MED ORDER — ONDANSETRON HCL 4 MG PO TABS: 4.0000 mg | ORAL_TABLET | Freq: Four times a day (QID) | ORAL | Status: DC | PRN

## 2014-05-12 MED ORDER — GLYCOPYRROLATE 0.2 MG/ML IJ SOLN
INTRAMUSCULAR | Status: AC
  Filled 2014-05-12: qty 3

## 2014-05-12 MED ORDER — LOSARTAN POTASSIUM 50 MG PO TABS
100.0000 mg | ORAL_TABLET | Freq: Every day | ORAL | Status: DC
  Filled 2014-05-12: qty 2

## 2014-05-12 MED ORDER — PROPOFOL 10 MG/ML IV BOLUS
INTRAVENOUS | Status: DC | PRN
  Administered 2014-05-12: 100 mg via INTRAVENOUS

## 2014-05-12 MED ORDER — ACETAMINOPHEN 325 MG PO TABS: 650.0000 mg | ORAL_TABLET | ORAL | Status: DC | PRN

## 2014-05-12 MED ORDER — MIDAZOLAM HCL 2 MG/2ML IJ SOLN
INTRAMUSCULAR | Status: AC
  Filled 2014-05-12: qty 2

## 2014-05-12 MED ORDER — LIDOCAINE HCL (CARDIAC) 20 MG/ML IV SOLN
INTRAVENOUS | Status: AC
  Filled 2014-05-12: qty 5

## 2014-05-12 MED ORDER — LACTATED RINGERS IV SOLN
INTRAVENOUS | Status: DC | PRN
  Administered 2014-05-12 (×2): via INTRAVENOUS

## 2014-05-12 MED ORDER — FENTANYL CITRATE 0.05 MG/ML IJ SOLN
INTRAMUSCULAR | Status: AC
  Filled 2014-05-12: qty 5

## 2014-05-12 MED ORDER — NEOSTIGMINE METHYLSULFATE 10 MG/10ML IV SOLN
INTRAVENOUS | Status: DC | PRN
  Administered 2014-05-12: 4 mg via INTRAVENOUS

## 2014-05-12 SURGICAL SUPPLY — 53 items
ADH SKN CLS APL DERMABOND .7 (GAUZE/BANDAGES/DRESSINGS) ×1
APL SKNCLS STERI-STRIP NONHPOA (GAUZE/BANDAGES/DRESSINGS) ×1
ATTRACTOMAT 16X20 MAGNETIC DRP (DRAPES) ×3 IMPLANT
BENZOIN TINCTURE PRP APPL 2/3 (GAUZE/BANDAGES/DRESSINGS) ×3 IMPLANT
BLADE SURG 10 STRL SS (BLADE) ×3 IMPLANT
BLADE SURG 15 STRL LF DISP TIS (BLADE) ×1 IMPLANT
BLADE SURG 15 STRL SS (BLADE) ×3
CANISTER SUCTION 2500CC (MISCELLANEOUS) ×3 IMPLANT
CHLORAPREP W/TINT 10.5 ML (MISCELLANEOUS) ×3 IMPLANT
CLIP TI MEDIUM 6 (CLIP) ×3 IMPLANT
CLIP TI WIDE RED SMALL 6 (CLIP) ×5 IMPLANT
CLOSURE WOUND 1/2 X4 (GAUZE/BANDAGES/DRESSINGS) ×1
CONT SPEC 4OZ CLIKSEAL STRL BL (MISCELLANEOUS) ×3 IMPLANT
CONT SPECI 4OZ STER CLIK (MISCELLANEOUS) ×6 IMPLANT
COVER SURGICAL LIGHT HANDLE (MISCELLANEOUS) ×3 IMPLANT
DECANTER SPIKE VIAL GLASS SM (MISCELLANEOUS) ×2 IMPLANT
DERMABOND ADVANCED (GAUZE/BANDAGES/DRESSINGS) ×2
DERMABOND ADVANCED .7 DNX12 (GAUZE/BANDAGES/DRESSINGS) IMPLANT
DRAPE PED LAPAROTOMY (DRAPES) ×3 IMPLANT
DRAPE UTILITY XL STRL (DRAPES) ×6 IMPLANT
ELECT CAUTERY BLADE 6.4 (BLADE) ×3 IMPLANT
ELECT REM PT RETURN 9FT ADLT (ELECTROSURGICAL) ×3
ELECTRODE REM PT RTRN 9FT ADLT (ELECTROSURGICAL) ×1 IMPLANT
GAUZE SPONGE 2X2 8PLY STRL LF (GAUZE/BANDAGES/DRESSINGS) ×1 IMPLANT
GAUZE SPONGE 4X4 16PLY XRAY LF (GAUZE/BANDAGES/DRESSINGS) ×3 IMPLANT
GLOVE SURG ORTHO 8.0 STRL STRW (GLOVE) ×3 IMPLANT
GOWN STRL REUS W/ TWL LRG LVL3 (GOWN DISPOSABLE) ×2 IMPLANT
GOWN STRL REUS W/ TWL XL LVL3 (GOWN DISPOSABLE) ×1 IMPLANT
GOWN STRL REUS W/TWL LRG LVL3 (GOWN DISPOSABLE) ×9
GOWN STRL REUS W/TWL XL LVL3 (GOWN DISPOSABLE) ×3
HEMOSTAT SURGICEL 2X4 FIBR (HEMOSTASIS) ×3 IMPLANT
KIT BASIN OR (CUSTOM PROCEDURE TRAY) ×3 IMPLANT
KIT ROOM TURNOVER OR (KITS) ×3 IMPLANT
NDL HYPO 25GX1X1/2 BEV (NEEDLE) IMPLANT
NEEDLE HYPO 25GX1X1/2 BEV (NEEDLE) IMPLANT
NS IRRIG 1000ML POUR BTL (IV SOLUTION) ×3 IMPLANT
PACK SURGICAL SETUP 50X90 (CUSTOM PROCEDURE TRAY) ×3 IMPLANT
PAD ARMBOARD 7.5X6 YLW CONV (MISCELLANEOUS) ×6 IMPLANT
PENCIL BUTTON HOLSTER BLD 10FT (ELECTRODE) ×3 IMPLANT
SPONGE GAUZE 2X2 STER 10/PKG (GAUZE/BANDAGES/DRESSINGS) ×2
SPONGE INTESTINAL PEANUT (DISPOSABLE) IMPLANT
STRIP CLOSURE SKIN 1/2X4 (GAUZE/BANDAGES/DRESSINGS) ×2 IMPLANT
SUT MNCRL AB 4-0 PS2 18 (SUTURE) ×3 IMPLANT
SUT SILK 2 0 (SUTURE) ×3
SUT SILK 2-0 18XBRD TIE 12 (SUTURE) IMPLANT
SUT SILK 3 0 (SUTURE)
SUT SILK 3-0 18XBRD TIE 12 (SUTURE) IMPLANT
SUT VIC AB 3-0 SH 18 (SUTURE) ×5 IMPLANT
SYR CONTROL 10ML LL (SYRINGE) IMPLANT
TOWEL OR 17X24 6PK STRL BLUE (TOWEL DISPOSABLE) ×3 IMPLANT
TOWEL OR 17X26 10 PK STRL BLUE (TOWEL DISPOSABLE) ×3 IMPLANT
TUBE CONNECTING 12'X1/4 (SUCTIONS) ×1
TUBE CONNECTING 12X1/4 (SUCTIONS) ×2 IMPLANT

## 2014-05-12 NOTE — Transfer of Care (Signed)
Immediate Anesthesia Transfer of Care Note  Patient: Jose Ware  Procedure(s) Performed: Procedure(s): PARATHYROIDECTOMY (N/A)  Patient Location: PACU  Anesthesia Type:General  Level of Consciousness: awake, alert  and oriented  Airway & Oxygen Therapy: Patient Spontanous Breathing and Patient connected to nasal cannula oxygen  Post-op Assessment: Report given to RN, Post -op Vital signs reviewed and stable and Patient moving all extremities X 4  Post vital signs: Reviewed and stable  Last Vitals:  Filed Vitals:   05/12/14 0926  BP:   Pulse:   Temp: 36.4 C  Resp:     Complications: No apparent anesthesia complications

## 2014-05-12 NOTE — Anesthesia Postprocedure Evaluation (Signed)
Anesthesia Post Note  Patient: Jose Ware  Procedure(s) Performed: Procedure(s) (LRB): PARATHYROIDECTOMY (N/A)  Anesthesia type: general  Patient location: PACU  Post pain: Pain level controlled  Post assessment: Patient's Cardiovascular Status Stable  Last Vitals:  Filed Vitals:   05/12/14 1242  BP: 161/79  Pulse: 68  Temp: 36.4 C  Resp: 18    Post vital signs: Reviewed and stable  Level of consciousness: sedated  Complications: No apparent anesthesia complications

## 2014-05-12 NOTE — Interval H&P Note (Signed)
History and Physical Interval Note:  05/12/2014 7:15 AM  Jose Ware  has presented today for surgery, with the diagnosis of PRIMARY HYPERPARATHYROIDISM.  The various methods of treatment have been discussed with the patient and family. After consideration of risks, benefits and other options for treatment, the patient has consented to    Procedure(s): PARATHYROIDECTOMY (N/A) as a surgical intervention .    The patient's history has been reviewed, patient examined, no change in status, stable for surgery.  I have reviewed the patient's chart and labs.  Questions were answered to the patient's satisfaction.    Earnstine Regal, MD, [facility] Office: [place]

## 2014-05-12 NOTE — Brief Op Note (Signed)
05/12/2014  9:16 AM  PATIENT:  Jose Ware  80 y.o. male  PRE-OPERATIVE DIAGNOSIS:  PRIMARY HYPERPARATHYROIDISM  POST-OPERATIVE DIAGNOSIS:  same  PROCEDURE:  Procedure(s): PARATHYROIDECTOMY (N/A)  SURGEON:  Surgeon(s) and Role:    * Armandina Gemma, MD - Primary  ANESTHESIA:   general  EBL:  Total I/O In: 1000 [I.V.:1000] Out: -   BLOOD ADMINISTERED:none  DRAINS: none   LOCAL MEDICATIONS USED:  NONE  SPECIMEN:  Excision  DISPOSITION OF SPECIMEN:  PATHOLOGY  COUNTS:  YES  TOURNIQUET:  * No tourniquets in log *  DICTATION: .Other Dictation: Dictation Number [id number]  PLAN OF CARE: Admit for overnight observation  PATIENT DISPOSITION:  PACU - hemodynamically stable.   Delay start of Pharmacological VTE agent (>24hrs) due to surgical blood loss or risk of bleeding: yes  Earnstine Regal, MD, [facility] Office: [phone]

## 2014-05-12 NOTE — Discharge Instructions (Signed)
[facility]  THYROID & PARATHYROID SURGERY:  POST-OP INSTRUCTIONS  Always review your discharge instruction sheet from the facility where your surgery was performed.  A prescription for pain medication may be given to you upon discharge.  Take your pain medication as prescribed.  If narcotic pain medicine is not needed, then you may take acetaminophen (Tylenol) or ibuprofen (Advil) as needed.  Take your usually prescribed medications unless otherwise directed.  If you need a refill on your pain medication, please contact your pharmacy. They will contact our office to request authorization.  Prescriptions will not be processed after 5 pm or on weekends.  Start with a light diet upon arrival home, such as soup and crackers or toast.  Be sure to drink plenty of fluids daily.  Resume your normal diet the day after surgery.  Most patients will experience some swelling and bruising on the chest and neck area.  Ice packs will help.  Swelling and bruising can take several days to resolve.   It is common to experience some constipation after surgery.  Increasing fluid intake and taking a stool softener will usually help or prevent this problem.  A mild laxative (Milk of Magnesia or Miralax) should be taken according to package directions if there has been no bowel movement after 48 hours.  If you have steri-strips and a gauze dressing over your incision, you may remove the gauze bandage on the second day after surgery, and you may shower at that time.  Leave your steri-strips (small skin tapes) in place directly over the incision.  These strips should remain on the skin for 7-10 days and then be removed.  You may get them wet in the shower and pat them dry.  If you have Dermabond (topical glue) over your incision, you may shower at any time following your procedure.  Leave the glue in place for 10-14 days.  When it begins to flake off around the edges, you may remove it.  You may  resume regular (light) daily activities beginning the next day--such as daily self-care, walking, climbing stairs--gradually increasing activities as tolerated.  You may have sexual intercourse when it is comfortable.  Refrain from any heavy lifting or straining until approved by your doctor.  You may drive when you no longer are taking prescription pain medication, you can comfortably wear a seatbelt, and you can safely maneuver your car and apply brakes.  You should see your doctor in the office for a follow-up appointment approximately two to three weeks after your surgery.  Make sure that you call for this appointment within a day or two after you arrive home to insure a convenient appointment time.  WHEN TO CALL YOUR DOCTOR: -- Fever greater than 101.5 -- Inability to urinate -- Nausea and/or vomiting - persistent -- Extreme swelling or bruising -- Continued bleeding from incision -- Increased pain, redness, or drainage from the incision -- Difficulty swallowing or breathing -- Muscle cramping or spasms -- Numbness or tingling in hands or around lips  The clinic staff is available to answer your questions during regular business hours.  Please dont hesitate to call and ask to speak to one of the nurses if you have concerns.  Earnstine Regal, MD, [facility] Office: [phone]  Website: [email or web address]

## 2014-05-12 NOTE — Anesthesia Procedure Notes (Signed)
Procedure Name: Intubation Date/Time: 05/12/2014 7:32 AM Performed by: Gaylene Brooks Pre-anesthesia Checklist: Patient being monitored, Suction available, Emergency Drugs available, Patient identified and Timeout performed Patient Re-evaluated:Patient Re-evaluated prior to inductionOxygen Delivery Method: Circle system utilized Preoxygenation: Pre-oxygenation with 100% oxygen Intubation Type: IV induction Ventilation: Mask ventilation without difficulty Laryngoscope Size: 2 and Miller Grade View: Grade III Tube type: Oral Tube size: 7.5 mm Number of attempts: 2 Airway Equipment and Method: Stylet Placement Confirmation: ETT inserted through vocal cords under direct vision,  breath sounds checked- equal and bilateral,  positive ETCO2 and CO2 detector Secured at: 22 cm Tube secured with: Tape Dental Injury: Teeth and Oropharynx as per pre-operative assessment  Difficulty Due To: Difficult Airway- due to reduced neck mobility Comments: DL with Miller 2 by CRNA. Gr 3 view, unable to pass ETT. DL by Dr. Conrad Bergen, Gr 3 view. ETT passed through cords. +ETCO2, BBS=.

## 2014-05-12 NOTE — Progress Notes (Signed)
Received from PACU to 6n15, A/Ox4, wife at bedside, oriented to [place] nd surroundings, denies nausea/pain at this time

## 2014-05-12 NOTE — Anesthesia Preprocedure Evaluation (Addendum)
Anesthesia Evaluation  Patient identified by MRN, date of birth, ID band Patient awake    Airway Mallampati: I  TM Distance: >3 FB Neck ROM: Full    Dental  (+) Teeth Intact, Dental Advisory Given   Pulmonary former smoker,          Cardiovascular hypertension,     Neuro/Psych    GI/Hepatic   Endo/Other    Renal/GU      Musculoskeletal   Abdominal   Peds  Hematology   Anesthesia Other Findings   Reproductive/Obstetrics                           Anesthesia Physical Anesthesia Plan  ASA: III  Anesthesia Plan:    Post-op Pain Management:    Induction:   Airway Management Planned:   Additional Equipment:   Intra-op Plan:   Post-operative Plan:   Informed Consent:   Plan Discussed with:   Anesthesia Plan Comments:        Anesthesia Quick Evaluation

## 2014-05-13 DIAGNOSIS — Z87891 Personal history of nicotine dependence: Secondary | ICD-10-CM | POA: Diagnosis not present

## 2014-05-13 DIAGNOSIS — I10 Essential (primary) hypertension: Secondary | ICD-10-CM | POA: Diagnosis not present

## 2014-05-13 DIAGNOSIS — Z9842 Cataract extraction status, left eye: Secondary | ICD-10-CM | POA: Diagnosis not present

## 2014-05-13 DIAGNOSIS — F1099 Alcohol use, unspecified with unspecified alcohol-induced disorder: Secondary | ICD-10-CM | POA: Diagnosis not present

## 2014-05-13 DIAGNOSIS — E210 Primary hyperparathyroidism: Secondary | ICD-10-CM | POA: Diagnosis not present

## 2014-05-13 DIAGNOSIS — Z8546 Personal history of malignant neoplasm of prostate: Secondary | ICD-10-CM | POA: Diagnosis not present

## 2014-05-13 DIAGNOSIS — Z9049 Acquired absence of other specified parts of digestive tract: Secondary | ICD-10-CM | POA: Diagnosis not present

## 2014-05-13 DIAGNOSIS — Z9841 Cataract extraction status, right eye: Secondary | ICD-10-CM | POA: Diagnosis not present

## 2014-05-13 DIAGNOSIS — Z7982 Long term (current) use of aspirin: Secondary | ICD-10-CM | POA: Diagnosis not present

## 2014-05-13 DIAGNOSIS — F1590 Other stimulant use, unspecified, uncomplicated: Secondary | ICD-10-CM | POA: Diagnosis not present

## 2014-05-13 DIAGNOSIS — Z903 Acquired absence of stomach [part of]: Secondary | ICD-10-CM | POA: Diagnosis not present

## 2014-05-13 DIAGNOSIS — Z86010 Personal history of colonic polyps: Secondary | ICD-10-CM | POA: Diagnosis not present

## 2014-05-13 DIAGNOSIS — Z881 Allergy status to other antibiotic agents status: Secondary | ICD-10-CM | POA: Diagnosis not present

## 2014-05-13 LAB — BASIC METABOLIC PANEL
Anion gap: 10 (ref 5–15)
BUN: 14 mg/dL (ref 6–23)
CO2: 27 mmol/L (ref 19–32)
CREATININE: 0.92 mg/dL (ref 0.50–1.35)
Calcium: 8.4 mg/dL (ref 8.4–10.5)
Chloride: 96 mmol/L (ref 96–112)
GFR, EST NON AFRICAN AMERICAN: 79 mL/min — AB (ref >90)
GLUCOSE: 100 mg/dL — AB (ref 70–99)
POTASSIUM: 3.8 mmol/L (ref 3.5–5.1)
Sodium: 133 mmol/L — ABNORMAL LOW (ref 135–145)

## 2014-05-13 MED ORDER — HYDROCODONE-ACETAMINOPHEN 5-325 MG PO TABS: 1.0000 | ORAL_TABLET | ORAL | Status: DC | PRN

## 2014-05-13 NOTE — Discharge Summary (Signed)
Patient ID: Jose Ware [id number] 79 y.o. Aug 06, 1934  Admit date: 05/12/2014  Discharge date and time: 05/13/2014  Admitting Physician: Armandina Gemma  Discharge Physician: Adin Hector  Admission Diagnoses: PRIMARY HYPERPARATHYROIDISM  Discharge Diagnoses: Primary hyperparathyroidism  Operations: Procedure(s): PARATHYROIDECTOMY  Admission Condition: good  Discharged Condition: good  Indication for Admission: The patient is a 80 year old male who presents with a parathyroid neoplasm. Patient is referred by Dr. Garret Reddish for evaluation of suspected primary hyperparathyroidism. Patient had presented with fatigue and concerns over a family history of leukemia. Laboratory studies over the past few years have shown a slowly rising serum calcium level ranging from 10.0 up to 11.0. Recent laboratory studies show a calcium of 10.6 with an intact PTH level of 135. Patient denies any history of nephrolithiasis or recent fractures. There is no family history of parathyroid disease and no family history of other endocrine neoplasms. Patient has had a tonsillectomy but otherwise no head or neck surgery. He presents today for further evaluation and recommendations for management. Patient has not had a bone density scan. He has not done a 24-hour urine collection. Nuclear medicine parathyroid sestamibi scan shows parathyroid adenoma localizing to the region inferior left lobe of thyroid gland and a second focus of uptake was localizing to the upper pole of the left thyroid gland.  Hospital Course: On the day of admission the patient was taken to the operating room and underwent a full neck exploration and parathyroidectomy. He was admitted for overnight observation and did well.    On postop day 1 he felt well and wanted to go home. His voice was strong. He denied paresthesias. Preop calcium level was 10.7. Calcium level on postop day #1 was 8.4.    Examination reveals strong voice.  Negative Chvostek sign. Soft wounds without hematoma.    He was given a prescription for hydrocodone for pain. He was told to continue his usual medications. He was told to take 2 times tablets twice a day. Diet and activities were discussed. He will follow-up with Dr. Harlow Asa in 3 weeks.  Consults: None  Significant Diagnostic Studies: Lab work and surgical pathology  Treatments: surgery: Neck exploration and parathyroidectomy  Disposition: Home  Patient Instructions:    Medication List    TAKE these medications        amLODipine 10 MG tablet  Commonly known as:  NORVASC  Take 1 tablet (10 mg total) by mouth daily.     beta carotene w/minerals tablet  Take 1 tablet by mouth daily.     erythromycin ophthalmic ointment  Place 1 application into the right eye 4 (four) times daily. 3-4x a day. One-half inch (1.25 cm) four times daily for 5 to 7 days     HYDROcodone-acetaminophen 5-325 MG per tablet  Commonly known as:  NORCO/VICODIN  Take 1-2 tablets by mouth every 4 (four) hours as needed for moderate pain.     losartan 100 MG tablet  Commonly known as:  COZAAR  Take 1 tablet (100 mg total) by mouth daily.     naproxen sodium 220 MG tablet  Commonly known as:  ANAPROX  Take 220 mg by mouth 2 (two) times daily with a meal.     vitamin B-12 500 MCG tablet  Commonly known as:  CYANOCOBALAMIN  Take 500 mcg by mouth daily.        Activity: activity as tolerated Diet: low fat, low cholesterol diet Wound Care: none needed  Follow-up:  With Dr. Harlow Asa  in 3 weeks.  Signed: Edsel Petrin. Dalbert Batman, M.D., FACS General and minimally invasive surgery Breast and Colorectal Surgery  05/13/2014, 8:32 AM

## 2014-05-13 NOTE — Progress Notes (Signed)
UR completed 

## 2014-05-15 ENCOUNTER — Encounter: Payer: Self-pay | Admitting: Family Medicine

## 2014-05-15 ENCOUNTER — Encounter (HOSPITAL_COMMUNITY): Payer: Self-pay | Admitting: Surgery

## 2014-05-15 NOTE — Op Note (Signed)
Jose Ware, Jose Ware NO.:  [id number]  MEDICAL RECORD NO.:  [id number]  LOCATION:                                 FACILITY:  PHYSICIAN:  Earnstine Regal, MD      DATE OF BIRTH:  [birth year]  DATE OF PROCEDURE:  05/12/2014                              OPERATIVE REPORT   PREOPERATIVE DIAGNOSIS:  Primary hyperparathyroidism.  POSTOPERATIVE DIAGNOSIS:  Primary hyperparathyroidism.  PROCEDURE:  Neck exploration with parathyroidectomy (left inferior gland and right inferior parathyroid adenoma).  SURGEON:  Armandina Gemma, MD, FACS  ANESTHESIA:  General.  ESTIMATED BLOOD LOSS:  Minimal.  PREPARATION:  ChloraPrep.  COMPLICATIONS:  None.  INDICATIONS:  The patient is a 80 year old male referred by Dr.  Garret Reddish for suspected primary  hyperparathyroidism.  The patient had laboratory studies showing calcium levels ranging from 10.0-11.0 and an intact PTH level elevated at 135.  The patient underwent nuclear medicine parathyroid scan, which showed activity in the left superior position and right inferior position.  The patient now comes to Surgery for neck exploration and  parathyroidectomy.  BODY OF REPORT:  Procedure was done in OR #2 at the [place]. [facility]  The patient was brought to the operating room, placed in supine position on the operating room table.  Following administration of general anesthesia, the patient was positioned and then prepped and draped in the usual aseptic fashion.  After ascertaining that an adequate level of anesthesia had been achieved, the left-sided neck incision was made.  Dissection carried through subcutaneous tissues and platysma and skin flaps were elevated cephalad and caudad.  We planned a retractors placed for exposure.  Strap muscles were  incised in the midline and dissection was begun on the left side. Left thyroid lobe was gently mobilized.  Venous tributaries were divided between Ligaclips.   Exploration revealed a mildly enlarged left inferior parathyroid gland at the top of the thyrothymic tract.  It was dissected out and resected.  It was submitted to Pathology, where frozen section by Dr. Donato Heinz shows hypercellular parathyroid tissue.  Further exploration on the left reveals no evidence of adenoma in the left superior position.  Dry pack was placed in the left neck.  Incision was extended across the midline.  Again  subplatysmal flaps were elevated.  A [place] self-retaining retractor was placed for exposure.  Strap muscles were reflected to the right exposing the right thyroid lobe.  Right lobe was gently dissected out and mobilized. Venous tributaries were divided between Ligaclips.  At the right inferior position, a markedly enlarged parathyroid gland was identified. It was gently dissected away from the thyroid and surrounding tissues. Vascular pedicles  divided between Ligaclips and the gland was excised. It was submitted for frozen section.  It weighs 3.6 g and is consistent with parathyroid  adenoma on review by the pathologist.  Further exploration on the right reveals no other  evidence of adenoma. Good hemostasis was noted.  Fibrillar was placed throughout the operative field.  Strap muscles were reapproximated in the midline with interrupted 3-0 Vicryl sutures.  Platysma was closed with interrupted 3- 0 Vicryl  sutures.  Skin was closed with a running 4-0  Monocryl subcuticular suture.  Dermabond was placed  as dressing.  The patient was awakened from anesthesia and brought to the recovery room.  The patient tolerated the procedure well.   Earnstine Regal, MD, [facility] Office: [phone]   TMG/MEDQ  D:  05/12/2014  T:  05/12/2014  Job:  [id number]  cc:   Garret Reddish, MD

## 2014-05-29 ENCOUNTER — Ambulatory Visit: Payer: Medicare Other | Admitting: Family Medicine

## 2014-05-29 DIAGNOSIS — E892 Postprocedural hypoparathyroidism: Secondary | ICD-10-CM | POA: Diagnosis not present

## 2014-06-13 ENCOUNTER — Encounter: Payer: Self-pay | Admitting: Family Medicine

## 2014-06-13 ENCOUNTER — Other Ambulatory Visit: Payer: Self-pay | Admitting: Family Medicine

## 2014-06-13 ENCOUNTER — Ambulatory Visit (INDEPENDENT_AMBULATORY_CARE_PROVIDER_SITE_OTHER): Payer: Medicare Other | Admitting: Family Medicine

## 2014-06-13 ENCOUNTER — Other Ambulatory Visit: Payer: Self-pay

## 2014-06-13 VITALS — BP 120/60 | HR 62 | Temp 99.1°F | Wt 228.0 lb

## 2014-06-13 DIAGNOSIS — R21 Rash and other nonspecific skin eruption: Secondary | ICD-10-CM

## 2014-06-13 MED ORDER — TRIAMCINOLONE ACETONIDE 0.1 % EX CREA: 1.0000 "application " | TOPICAL_CREAM | Freq: Two times a day (BID) | CUTANEOUS | Status: DC

## 2014-06-13 NOTE — Progress Notes (Signed)
Jose Reddish, MD Phone: [phone]  Subjective:   Jose Ware is a 80 y.o. year old very pleasant male patient who presents with the following:  Rash on lower legs Started after parathyroid surgery as 1 or 2 spots on each leg. Spots itch, scratches them some probably at night. Seem to be worsening, growing in size and #. Also has developed swelling in the legs which is new to him. Has tried calamine relief but no improvement. Has not spread above the knee. About 6 spots, some in more beginning stages on R. On L leg, 1 spot. Each leg has a larger lesion. Some of these lesions will weep and then dry up and cycle through this process.   Denies new detergents, socks, fabric softeners, contacts, has not been out in the woods. Has used pants when doing yardwork.   ROS-not ill appearing, no fever/chills. No new medications. Not immunocompromised. No mucus membrane involvement.   Past Medical History- Patient Active Problem List   Diagnosis Date Noted  . Primary hyperparathyroidism 12/28/2013    Priority: High  . Fatigue 12/01/2013    Priority: High  . ADENOCARCINOMA, PROSTATE 12/14/2008    Priority: High  . CKD (chronic kidney disease), stage II 12/01/2013    Priority: Medium  . PSORIASIS 10/15/2007    Priority: Medium  . Essential hypertension 11/27/2006    Priority: Medium  . History of stomach cancer 12/01/2013    Priority: Low  . Lower back pain 03/24/2013    Priority: Low  . Bradycardia 09/23/2012    Priority: Low  . Bruising 09/23/2012    Priority: Low  . B12 deficiency 09/17/2010    Priority: Low  . COLONIC POLYPS, HX OF 12/14/2008    Priority: Low  . PERIPHERAL NEUROPATHY 10/18/2007    Priority: Low  . DEGENERATIVE JOINT DISEASE 11/27/2006    Priority: Low  . NEPHROLITHIASIS, HX OF 11/27/2006    Priority: Low   Medications- reviewed and updated Current Outpatient Prescriptions  Medication Sig Dispense Refill  . amLODipine (NORVASC) 10 MG tablet Take 1  tablet (10 mg total) by mouth daily. (Patient taking differently: Take 10 mg by mouth daily before breakfast. ) 30 tablet 5  . beta carotene w/minerals (OCUVITE) tablet Take 1 tablet by mouth daily.    Marland Kitchen losartan (COZAAR) 100 MG tablet Take 1 tablet (100 mg total) by mouth daily. (Patient taking differently: Take 100 mg by mouth daily before breakfast. ) 90 tablet 3  . naproxen sodium (ANAPROX) 220 MG tablet Take 220 mg by mouth 2 (two) times daily with a meal.    . vitamin B-12 (CYANOCOBALAMIN) 500 MCG tablet Take 500 mcg by mouth daily.     Objective: BP 120/60 mmHg  Pulse 62  Temp(Src) 99.1 F (37.3 C)  Wt 228 lb (103.42 kg) Gen: NAD, resting comfortably CV: RRR no murmurs rubs or gallops Lungs: CTAB no crackles, wheeze, rhonchi Abdomen: soft/nontender/nondistended/normal bowel sounds. Ext: 1+ pitting edema Skin: warm, dry, not hot to touch on lower legs.  There is a 3 x 2 cm crusted over lesion on bilateral lower extremities, r leg has 5 smaller lesions not necessarily in close proximity to largest lesion.   Assessment/Plan:  Rash on lower legs Possible contact dermatitis or atopic dermatitis (though no history and less likely). No clear inciting factor though. We will trial 10 days steroid cream. If no improvement or worsens, can refer to derm Bevelyn Ngo may enter if patient calls). Edema is new and to me was  concerning for infection but there is no erythema around the sites and no warmth. Recent parathyroid surgery but doubt relation to this-consider electrolytes at follow up as well as TSH (though no thyroid surgery directly).  Return precautions advised.   Meds ordered this encounter  Medications  . triamcinolone cream (KENALOG) 0.1 %    Sig: Apply 1 application topically 2 (two) times daily. Over lesions for 10 days. If worsens, stop and call.    Dispense:  80 g    Refill:  1

## 2014-06-13 NOTE — Patient Instructions (Signed)
Rash on legs, possible contact dermatitis  Treat with triamcinolone steroid cream  If worsens, stop and send to dermatology  If does not improve, send to dermatology

## 2014-06-27 ENCOUNTER — Other Ambulatory Visit: Payer: Self-pay

## 2014-06-27 MED ORDER — AMLODIPINE BESYLATE 10 MG PO TABS: 10.0000 mg | ORAL_TABLET | Freq: Every day | ORAL | Status: DC

## 2014-09-04 ENCOUNTER — Telehealth: Payer: Self-pay

## 2014-09-04 ENCOUNTER — Encounter: Payer: Self-pay | Admitting: Family Medicine

## 2014-09-04 DIAGNOSIS — R21 Rash and other nonspecific skin eruption: Secondary | ICD-10-CM

## 2014-09-04 NOTE — Telephone Encounter (Signed)
Referral entered  

## 2014-09-06 DIAGNOSIS — L309 Dermatitis, unspecified: Secondary | ICD-10-CM | POA: Diagnosis not present

## 2014-09-06 DIAGNOSIS — L0100 Impetigo, unspecified: Secondary | ICD-10-CM | POA: Diagnosis not present

## 2014-09-06 DIAGNOSIS — L300 Nummular dermatitis: Secondary | ICD-10-CM | POA: Diagnosis not present

## 2014-10-11 DIAGNOSIS — H3562 Retinal hemorrhage, left eye: Secondary | ICD-10-CM | POA: Diagnosis not present

## 2014-10-11 DIAGNOSIS — H3531 Nonexudative age-related macular degeneration: Secondary | ICD-10-CM | POA: Diagnosis not present

## 2014-10-11 DIAGNOSIS — H5213 Myopia, bilateral: Secondary | ICD-10-CM | POA: Diagnosis not present

## 2014-10-11 DIAGNOSIS — H35032 Hypertensive retinopathy, left eye: Secondary | ICD-10-CM | POA: Diagnosis not present

## 2014-10-26 ENCOUNTER — Encounter: Payer: Self-pay | Admitting: Gastroenterology

## 2014-11-14 ENCOUNTER — Encounter: Payer: Self-pay | Admitting: Family Medicine

## 2014-11-14 ENCOUNTER — Encounter: Payer: Self-pay | Admitting: Gastroenterology

## 2014-11-16 ENCOUNTER — Encounter: Payer: Self-pay | Admitting: Family Medicine

## 2014-12-05 DIAGNOSIS — H18411 Arcus senilis, right eye: Secondary | ICD-10-CM | POA: Diagnosis not present

## 2014-12-05 DIAGNOSIS — H26493 Other secondary cataract, bilateral: Secondary | ICD-10-CM | POA: Diagnosis not present

## 2014-12-05 DIAGNOSIS — H18412 Arcus senilis, left eye: Secondary | ICD-10-CM | POA: Diagnosis not present

## 2014-12-05 DIAGNOSIS — Z961 Presence of intraocular lens: Secondary | ICD-10-CM | POA: Diagnosis not present

## 2014-12-05 DIAGNOSIS — H26491 Other secondary cataract, right eye: Secondary | ICD-10-CM | POA: Diagnosis not present

## 2014-12-12 DIAGNOSIS — H26491 Other secondary cataract, right eye: Secondary | ICD-10-CM | POA: Diagnosis not present

## 2014-12-14 DIAGNOSIS — C61 Malignant neoplasm of prostate: Secondary | ICD-10-CM | POA: Diagnosis not present

## 2014-12-15 ENCOUNTER — Other Ambulatory Visit (INDEPENDENT_AMBULATORY_CARE_PROVIDER_SITE_OTHER): Payer: Medicare Other

## 2014-12-15 DIAGNOSIS — E210 Primary hyperparathyroidism: Secondary | ICD-10-CM

## 2014-12-15 DIAGNOSIS — Z0000 Encounter for general adult medical examination without abnormal findings: Secondary | ICD-10-CM

## 2014-12-15 DIAGNOSIS — I10 Essential (primary) hypertension: Secondary | ICD-10-CM

## 2014-12-15 DIAGNOSIS — R319 Hematuria, unspecified: Secondary | ICD-10-CM

## 2014-12-15 LAB — CBC WITH DIFFERENTIAL/PLATELET
Basophils Absolute: 0 10*3/uL (ref 0.0–0.1)
Basophils Relative: 0.4 % (ref 0.0–3.0)
EOS PCT: 1.7 % (ref 0.0–5.0)
Eosinophils Absolute: 0.2 10*3/uL (ref 0.0–0.7)
HCT: 43.7 % (ref 39.0–52.0)
HEMOGLOBIN: 14.7 g/dL (ref 13.0–17.0)
Lymphocytes Relative: 8 % — ABNORMAL LOW (ref 12.0–46.0)
Lymphs Abs: 0.9 10*3/uL (ref 0.7–4.0)
MCHC: 33.7 g/dL (ref 30.0–36.0)
MCV: 96.4 fl (ref 78.0–100.0)
Monocytes Absolute: 0.8 10*3/uL (ref 0.1–1.0)
Monocytes Relative: 7 % (ref 3.0–12.0)
NEUTROS ABS: 9.4 10*3/uL — AB (ref 1.4–7.7)
Neutrophils Relative %: 82.9 % — ABNORMAL HIGH (ref 43.0–77.0)
PLATELETS: 315 10*3/uL (ref 150.0–400.0)
RBC: 4.53 Mil/uL (ref 4.22–5.81)
RDW: 15.7 % — ABNORMAL HIGH (ref 11.5–15.5)
WBC: 11.4 10*3/uL — AB (ref 4.0–10.5)

## 2014-12-15 LAB — LIPID PANEL
CHOLESTEROL: 104 mg/dL (ref 0–200)
HDL: 32 mg/dL — AB (ref >39.00)
LDL CALC: 59 mg/dL (ref 0–99)
NonHDL: 71.59
TRIGLYCERIDES: 64 mg/dL (ref 0.0–149.0)
Total CHOL/HDL Ratio: 3
VLDL: 12.8 mg/dL (ref 0.0–40.0)

## 2014-12-15 LAB — POCT URINALYSIS DIPSTICK
Bilirubin, UA: NEGATIVE
GLUCOSE UA: NEGATIVE
Ketones, UA: NEGATIVE
Leukocytes, UA: NEGATIVE
Nitrite, UA: NEGATIVE
Protein, UA: NEGATIVE
Spec Grav, UA: 1.025
UROBILINOGEN UA: 0.2
pH, UA: 5

## 2014-12-15 LAB — URINALYSIS, MICROSCOPIC ONLY

## 2014-12-15 LAB — HEPATIC FUNCTION PANEL
ALT: 9 U/L (ref 0–53)
AST: 9 U/L (ref 0–37)
Albumin: 3.7 g/dL (ref 3.5–5.2)
Alkaline Phosphatase: 83 U/L (ref 39–117)
Bilirubin, Direct: 0.3 mg/dL (ref 0.0–0.3)
TOTAL PROTEIN: 6.4 g/dL (ref 6.0–8.3)
Total Bilirubin: 1.3 mg/dL — ABNORMAL HIGH (ref 0.2–1.2)

## 2014-12-15 LAB — BASIC METABOLIC PANEL
BUN: 21 mg/dL (ref 6–23)
CHLORIDE: 105 meq/L (ref 96–112)
CO2: 29 mEq/L (ref 19–32)
CREATININE: 0.93 mg/dL (ref 0.40–1.50)
Calcium: 8.8 mg/dL (ref 8.4–10.5)
GFR: 83.4 mL/min (ref >60.00)
GLUCOSE: 75 mg/dL (ref 70–99)
Potassium: 4.3 mEq/L (ref 3.5–5.1)
Sodium: 142 mEq/L (ref 135–145)

## 2014-12-15 LAB — TSH: TSH: 2.48 u[IU]/mL (ref 0.35–4.50)

## 2014-12-15 LAB — PSA: PSA: 0.01 ng/mL — AB (ref 0.10–4.00)

## 2014-12-15 NOTE — Addendum Note (Signed)
Addended by: Elmer Picker on: 12/15/2014 11:53 AM   Modules accepted: Orders

## 2014-12-21 DIAGNOSIS — C61 Malignant neoplasm of prostate: Secondary | ICD-10-CM | POA: Diagnosis not present

## 2014-12-22 ENCOUNTER — Encounter: Payer: Self-pay | Admitting: Family Medicine

## 2014-12-22 ENCOUNTER — Encounter: Payer: Medicare Other | Admitting: Family Medicine

## 2014-12-22 ENCOUNTER — Ambulatory Visit (INDEPENDENT_AMBULATORY_CARE_PROVIDER_SITE_OTHER): Payer: Medicare Other | Admitting: Family Medicine

## 2014-12-22 VITALS — BP 130/82 | HR 57 | Temp 98.0°F | Ht 73.0 in | Wt 223.0 lb

## 2014-12-22 DIAGNOSIS — Z23 Encounter for immunization: Secondary | ICD-10-CM

## 2014-12-22 DIAGNOSIS — M19031 Primary osteoarthritis, right wrist: Secondary | ICD-10-CM

## 2014-12-22 DIAGNOSIS — R590 Localized enlarged lymph nodes: Secondary | ICD-10-CM | POA: Diagnosis not present

## 2014-12-22 DIAGNOSIS — Z0000 Encounter for general adult medical examination without abnormal findings: Secondary | ICD-10-CM

## 2014-12-22 DIAGNOSIS — M129 Arthropathy, unspecified: Secondary | ICD-10-CM

## 2014-12-22 NOTE — Patient Instructions (Addendum)
We will call you within a week about your referral to get your CT abdomen/pelvis. If you do not hear within 2 weeks, give [place] a call.   We will call you within a week about your referral to hand surgery for right wrist arthritis. If you do not hear within 2 weeks, give [place] a call.   Otherwise no changes  Health Maintenance Due  Topic Date Due  . INFLUENZA VACCINE - today 10/09/2014

## 2014-12-22 NOTE — Progress Notes (Signed)
Jose Reddish, MD Phone: [phone]  Subjective:  Patient presents today for their annual physical. Chief complaint-noted.   See problem oriented charting and below ROS- full  review of systems was completed and negative except for as noted below With pertinent negatives no chest pain, shortness of breat, dyspnea on exertion  The following were reviewed and entered/updated in epic: Past Medical History  Diagnosis Date  . Peripheral neuropathy ([place])   . Psoriasis   . Hernia   . Strep throat   . Tenosynovitis   . Nephrolithiasis   . Diverticulitis   . Adenomatous colon polyp   . DIVERTICULITIS, HX OF 11/27/2006        . NEPHROLITHIASIS, HX OF 11/27/2006         . Hypertension   . DJD (degenerative joint disease)     wrist - R   . History of blood transfusion 1985    with colon surgery  . Adenocarcinoma of prostate [facility])     XRT & Radiation seed implantation in 2010   Patient Active Problem List   Diagnosis Date Noted  . Primary hyperparathyroidism ([place]) 12/28/2013    Priority: High  . Fatigue 12/01/2013    Priority: High  . ADENOCARCINOMA, PROSTATE 12/14/2008    Priority: High  . CKD (chronic kidney disease), stage II 12/01/2013    Priority: Medium  . PSORIASIS 10/15/2007    Priority: Medium  . Essential hypertension 11/27/2006    Priority: Medium  . History of stomach cancer 12/01/2013    Priority: Low  . Lower back pain 03/24/2013    Priority: Low  . Bradycardia 09/23/2012    Priority: Low  . Bruising 09/23/2012    Priority: Low  . B12 deficiency 09/17/2010    Priority: Low  . COLONIC POLYPS, HX OF 12/14/2008    Priority: Low  . PERIPHERAL NEUROPATHY 10/18/2007    Priority: Low  . DEGENERATIVE JOINT DISEASE 11/27/2006    Priority: Low  . NEPHROLITHIASIS, HX OF 11/27/2006    Priority: Low   Past Surgical History  Procedure Laterality Date  . Cholecystectomy  1986  . Reversal of colostomy  1987  . Wrist surgery      Remote complicated  w/infection  . Colostomy  1985    After colectomy for diverticulitis, pt. remarks during this surgery they "gave me the paddles two times  because of bleeding", pt. states it was unrelated to any anesthesia complication  . Knee arthroscopy  2003    Left  . Appendectomy  1985  . Tonsillectomy      As a child  . Knee surgery  1956    Left 1956, Right w/cartilage removed later  . Cataract extraction, bilateral    . Back surgery  1980    minor disk surgery: instrumentation placed and removed in a second procedure  . Eye surgery      /w IOL  . Colon surgery  1980's    pt. had ileus, had colon surgery, requiring colostomy & then reversal & then dehisence of that wound & return to OR for repair & cholecystectomy    . Parathyroidectomy N/A 05/12/2014    Procedure: PARATHYROIDECTOMY;  Surgeon: Armandina Gemma, MD;  Location: [facility] OR;  Service: General;  Laterality: N/A;    Family History  Problem Relation Age of Onset  . Coronary artery disease Mother   . Alcohol abuse Father   . Cirrhosis Father   . Heart failure Sister   . Breast cancer Sister  W/involvement of right arm leading to amputation  . Coronary artery disease Brother   . Heart attack Brother   . Prostate cancer Neg Hx   . Colon cancer Neg Hx   . Diabetes Neg Hx   . Glaucoma Neg Hx   . Clotting disorder Brother     Medications- reviewed and updated Current Outpatient Prescriptions  Medication Sig Dispense Refill  . amLODipine (NORVASC) 10 MG tablet Take 1 tablet (10 mg total) by mouth daily. 30 tablet 5  . beta carotene w/minerals (OCUVITE) tablet Take 1 tablet by mouth daily.    Marland Kitchen losartan (COZAAR) 100 MG tablet Take 1 tablet (100 mg total) by mouth daily. (Patient taking differently: Take 100 mg by mouth daily before breakfast. ) 90 tablet 3  . triamcinolone cream (KENALOG) 0.1 % Apply 1 application topically 2 (two) times daily. Over lesions for 10 days. If worsens, stop and call. 80 g 1  . vitamin B-12 (CYANOCOBALAMIN)  500 MCG tablet Take 500 mcg by mouth daily.     No current facility-administered medications for this visit.    Allergies-reviewed and updated Allergies  Allergen Reactions  . Betadine [Povidone Iodine]     blistering  . Povidone-Iodine     REACTION: blisters    Social History   Social History  . Marital Status: Married    Spouse Name: N/A  . Number of Children: N/A  . Years of Education: N/A   Social History Main Topics  . Smoking status: Former Smoker    Quit date: 03/10/1981  . Smokeless tobacco: None  . Alcohol Use: Yes     Comment: RARE  . Drug Use: No  . Sexual Activity: Not Asked   Other Topics Concern  . None   Social History Narrative   [employer], [place].   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 Oropharynx normal Neck: no thyromegaly CV: RRR no murmurs rubs or gallops Lungs: CTAB no crackles, wheeze, rhonchi Abdomen: soft/nontender/nondistended/normal bowel sounds. No rebound or guarding. Multiple scars on abdomen. Prominent xiphoid Ext: 1+ edema under compression stockings Skin: warm, dry Neuro: grossly normal, moves all extremities, PERRLA  Assessment/Plan:  80 y.o. male presenting for annual physical.  Health Maintenance counseling: 1. Anticipatory guidance: Patient counseled regarding regular dental exams, regular eye exams (cataract lens surgery upcoming- scar tissue build up- laster), wearing  seatbelts, wearing sunscreen 2. Risk factor reduction:  Advised patient of need for regular exercise and diet rich and fruits and vegetables to reduce risk of heart attack and stroke.  3. Immunizations/screenings/ancillary studies Health Maintenance Due  Topic Date Due  . INFLUENZA VACCINE - today 10/09/2014  4. Prostate cancer follow up- PSA suppressed- has radioactive seeds Lab Results  Component Value Date   PSA 0.01* 12/15/2014   PSA 0.39 12/14/2008  PSA 7.77* 03/15/2007  5. Colon cancer screening - 04/04/2009 with 5 year follow up recommended. Planned for 7th ov november 6. Skin cancer screening- recently saw dermatology for psoriasis, reasonable to do full skin exam with dermatology  S/p parathyroidectomy- doing well   HTN- at goal on amlodipine and losartan BP Readings from Last 3 Encounters:  12/22/14 130/82  06/13/14 120/60  05/13/14 160/69   CT abdomen/pelvis needed to follow up lymphadenopathy 01/03/14 Also with elevated WBC 2 brothers with leukemia  6 months verbal but sooner Return precautions advised.   Orders Placed This Encounter  Procedures  . Flu vaccine HIGH DOSE PF

## 2014-12-22 NOTE — Progress Notes (Signed)
Pre visit review using our clinic review tool, if applicable. No additional management support is needed unless otherwise documented below in the visit note. 

## 2014-12-26 DIAGNOSIS — H26492 Other secondary cataract, left eye: Secondary | ICD-10-CM | POA: Diagnosis not present

## 2015-01-01 ENCOUNTER — Inpatient Hospital Stay: Admission: RE | Admit: 2015-01-01 | Payer: Medicare Other | Source: Ambulatory Visit

## 2015-01-01 ENCOUNTER — Ambulatory Visit (AMBULATORY_SURGERY_CENTER): Payer: Self-pay | Admitting: *Deleted

## 2015-01-01 VITALS — Ht 73.0 in | Wt 224.2 lb

## 2015-01-01 DIAGNOSIS — Z86010 Personal history of colonic polyps: Secondary | ICD-10-CM

## 2015-01-01 MED ORDER — NA SULFATE-K SULFATE-MG SULF 17.5-3.13-1.6 GM/177ML PO SOLN: ORAL | Status: DC

## 2015-01-01 NOTE — Progress Notes (Signed)
No allergies to eggs or soy. No problems with anesthesia.  Pt given Emmi instructions for colonoscopy  No oxygen use  No diet drug use  

## 2015-01-02 DIAGNOSIS — H26492 Other secondary cataract, left eye: Secondary | ICD-10-CM | POA: Diagnosis not present

## 2015-01-03 DIAGNOSIS — M19131 Post-traumatic osteoarthritis, right wrist: Secondary | ICD-10-CM | POA: Diagnosis not present

## 2015-01-03 DIAGNOSIS — M24232 Disorder of ligament, left wrist: Secondary | ICD-10-CM | POA: Diagnosis not present

## 2015-01-05 ENCOUNTER — Ambulatory Visit (INDEPENDENT_AMBULATORY_CARE_PROVIDER_SITE_OTHER)
Admission: RE | Admit: 2015-01-05 | Discharge: 2015-01-05 | Disposition: A | Discharge status: Home / Self Care | Payer: Medicare Other | Source: Ambulatory Visit | Attending: Family Medicine | Admitting: Family Medicine

## 2015-01-05 DIAGNOSIS — R590 Localized enlarged lymph nodes: Secondary | ICD-10-CM | POA: Diagnosis not present

## 2015-01-05 DIAGNOSIS — R599 Enlarged lymph nodes, unspecified: Secondary | ICD-10-CM | POA: Diagnosis not present

## 2015-01-05 MED ORDER — IOHEXOL 300 MG/ML  SOLN
100.0000 mL | Freq: Once | INTRAMUSCULAR | Status: AC | PRN
  Administered 2015-01-05: 100 mL via INTRAVENOUS

## 2015-01-09 ENCOUNTER — Encounter: Payer: Self-pay | Admitting: Family Medicine

## 2015-01-09 NOTE — Telephone Encounter (Signed)
Please refer to hematology under leukocytosis and lymphadenopathy

## 2015-01-15 ENCOUNTER — Encounter: Payer: Self-pay | Admitting: Gastroenterology

## 2015-01-15 ENCOUNTER — Ambulatory Visit (AMBULATORY_SURGERY_CENTER): Payer: Medicare Other | Admitting: Gastroenterology

## 2015-01-15 VITALS — BP 140/80 | HR 55 | Temp 95.8°F | Resp 11 | Ht 73.0 in | Wt 224.0 lb

## 2015-01-15 DIAGNOSIS — D122 Benign neoplasm of ascending colon: Secondary | ICD-10-CM

## 2015-01-15 DIAGNOSIS — D120 Benign neoplasm of cecum: Secondary | ICD-10-CM

## 2015-01-15 DIAGNOSIS — D123 Benign neoplasm of transverse colon: Secondary | ICD-10-CM

## 2015-01-15 DIAGNOSIS — Z86010 Personal history of colonic polyps: Secondary | ICD-10-CM | POA: Diagnosis not present

## 2015-01-15 MED ORDER — SODIUM CHLORIDE 0.9 % IV SOLN: 500.0000 mL | INTRAVENOUS | Status: DC

## 2015-01-15 NOTE — Patient Instructions (Signed)
  AVOID ALL ASPIRIN, ASPIRIN PRODUCTS AND NSAIDS (MOTRIN, IBUPROFEN, ADVIL, ALEVE, ETC.) FOR TWO WEEKS, January 29, 2015.     YOU HAD AN ENDOSCOPIC PROCEDURE TODAY AT [facility]:   Refer to the procedure report that was given to you for any specific questions about what was found during the examination.  If the procedure report does not answer your questions, please call your gastroenterologist to clarify.  If you requested that your care partner not be given the details of your procedure findings, then the procedure report has been included in a sealed envelope for you to review at your convenience later.  YOU SHOULD EXPECT: Some feelings of bloating in the abdomen. Passage of more gas than usual.  Walking can help get rid of the air that was put into your GI tract during the procedure and reduce the bloating. If you had a lower endoscopy (such as a colonoscopy or flexible sigmoidoscopy) you may notice spotting of blood in your stool or on the toilet paper. If you underwent a bowel prep for your procedure, you may not have a normal bowel movement for a few days.  Please Note:  You might notice some irritation and congestion in your nose or some drainage.  This is from the oxygen used during your procedure.  There is no need for concern and it should clear up in a day or so.  SYMPTOMS TO REPORT IMMEDIATELY:   Following lower endoscopy (colonoscopy or flexible sigmoidoscopy):  Excessive amounts of blood in the stool  Significant tenderness or worsening of abdominal pains  Swelling of the abdomen that is new, acute  Fever of 100F or higher   For urgent or emergent issues, a gastroenterologist can be reached at any hour by calling [phone].   DIET: Your first meal following the procedure should be a small meal and then it is ok to progress to your normal diet. Heavy or fried foods are harder to digest and may make you feel nauseous or bloated.  Likewise, meals heavy in  dairy and vegetables can increase bloating.  Drink plenty of fluids but you should avoid alcoholic beverages for 24 hours.  ACTIVITY:  You should plan to take it easy for the rest of today and you should NOT DRIVE or use heavy machinery until tomorrow (because of the sedation medicines used during the test).    FOLLOW UP: Our staff will call the number listed on your records the next business day following your procedure to check on you and address any questions or concerns that you may have regarding the information given to you following your procedure. If we do not reach you, we will leave a message.  However, if you are feeling well and you are not experiencing any problems, there is no need to return our call.  We will assume that you have returned to your regular daily activities without incident.  If any biopsies were taken you will be contacted by phone or by letter within the next 1-3 weeks.  Please call us at [phone] if you have not heard about the biopsies in 3 weeks.    SIGNATURES/CONFIDENTIALITY: You and/or your care partner have signed paperwork which will be entered into your electronic medical record.  These signatures attest to the fact that that the information above on your After Visit Summary has been reviewed and is understood.  Full responsibility of the confidentiality of this discharge information lies with you and/or your care-partner.

## 2015-01-15 NOTE — Progress Notes (Signed)
Called to room to assist during endoscopic procedure.  Patient ID and intended procedure confirmed with present staff. Received instructions for my participation in the procedure from the performing physician.  

## 2015-01-15 NOTE — Progress Notes (Signed)
Report to PACU, RN, vss, BBS= Clear.  

## 2015-01-15 NOTE — Op Note (Signed)
[place]  [address] [employer] [address] [place], [id number]   COLONOSCOPY PROCEDURE REPORT  PATIENT: Jose Ware, Jose Ware  MR#: [id number] BIRTHDATE: 1934/09/20 , 17  yrs. old GENDER: male ENDOSCOPIST: Ladene Artist, MD, [facility] PROCEDURE DATE:  01/15/2015 PROCEDURE:   Colonoscopy, surveillance , Colonoscopy with biopsy, Colonoscopy with snare polypectomy, and Submucosal injection, any substance First Screening Colonoscopy - Avg.  risk and is 50 yrs.  old or older - No.  Prior Negative Screening - Now for repeat screening. N/A  History of Adenoma - Now for follow-up colonoscopy & has been > or = to 3 yrs.  Yes hx of adenoma.  Has been 3 or more years since last colonoscopy.  Polyps removed today? Yes ASA CLASS:   Class II INDICATIONS:Surveillance due to prior colonic neoplasia and PH Colon Adenoma. MEDICATIONS: Monitored anesthesia care and Propofol 220 mg IV DESCRIPTION OF PROCEDURE:   After the risks benefits and alternatives of the procedure were thoroughly explained, informed consent was obtained.  The digital rectal exam revealed no abnormalities of the rectum.   The LB PFC-H190 [id number]  endoscope was introduced through the anus and advanced to the cecum, which was identified by both the appendix and ileocecal valve. No adverse events experienced.   The quality of the prep was adequate (Suprep was used)  The instrument was then slowly withdrawn as the colon was fully examined. Estimated blood loss is zero unless otherwise noted in this procedure report.    COLON FINDINGS: A sessile polyp measuring 25 mm in size was found in the transverse colon. It was located behind a fold at a sharp turn in the colon. A polypectomy was performed in a piecemeal fashion using snare cautery.  The resection appeared to be complete, the polyp tissue was completely retrieved and sent to histology. Injection (tattooing) was performed proximal and distal to the tattoo.  A sessile polyp  measuring 6 mm in size was found at the cecum.  A polypectomy was performed with a cold snare.  The resection was complete, the polyp tissue was completely retrieved and sent to histology.   A sessile polyp measuring 4 mm in size was found in the ascending colon.  A polypectomy was performed with cold forceps.  The resection was complete, the polyp tissue was completely retrieved and sent to histology.  There was mild diverticulosis noted in the sigmoid colon.   The examination was otherwise normal.  Retroflexed views revealed internal Grade I hemorrhoids. The time to cecum = 1.5 Withdrawal time = 26.0   The scope was withdrawn and the procedure completed.  COMPLICATIONS: There were no immediate complications.  ENDOSCOPIC IMPRESSION: 1.   Sessile polyp in the transverse colon; polypectomy performed in a piecemeal fashion using snare cautery; Injection (tattooing) performed 2.   Sessile polyp at the cecum; polypectomy was performed with a cold snare 3.   Sessile polyp in the ascending colon; polypectomy was performed with cold forceps 4.   Mild diverticulosis in the sigmoid colon 5.   Grade l internal hemorrhoids   RECOMMENDATIONS: 1.  Await pathology results 2.  Hold Aspirin and all other NSAIDS for 2 weeks. 3.  High fiber diet with liberal fluid intake. 4.  Repeat Colonoscopy in  year 6 months to assess for complete piecemeal polypectomy  eSigned:  Ladene Artist, MD, [facility] 01/15/2015 2:05 PM      PATIENT NAME:  Jose Ware, Jose Ware MR#: [id number]

## 2015-01-16 ENCOUNTER — Telehealth: Payer: Self-pay | Admitting: *Deleted

## 2015-01-16 ENCOUNTER — Encounter: Payer: Self-pay | Admitting: Family Medicine

## 2015-01-16 ENCOUNTER — Other Ambulatory Visit: Payer: Self-pay | Admitting: Family Medicine

## 2015-01-16 MED ORDER — AMLODIPINE BESYLATE 10 MG PO TABS: 10.0000 mg | ORAL_TABLET | Freq: Every day | ORAL | Status: DC

## 2015-01-16 NOTE — Telephone Encounter (Signed)
  Follow up Call-  Call back number 01/15/2015  Post procedure Call Back phone  # [phone]  Permission to leave phone message Yes     Patient questions:  Do you have a fever, pain , or abdominal swelling? No. Pain Score  0 *  Have you tolerated food without any problems? Yes.    Have you been able to return to your normal activities? Yes.    Do you have any questions about your discharge instructions: Diet   No. Medications  No. Follow up visit  No.  Do you have questions or concerns about your Care? No.  Actions: * If pain score is 4 or above: No action needed, pain <4.

## 2015-01-18 ENCOUNTER — Encounter: Payer: Self-pay | Admitting: Gastroenterology

## 2015-01-18 ENCOUNTER — Encounter: Payer: Self-pay | Admitting: Family Medicine

## 2015-01-18 DIAGNOSIS — Z86010 Personal history of colonic polyps: Secondary | ICD-10-CM | POA: Insufficient documentation

## 2015-01-24 ENCOUNTER — Encounter: Payer: Self-pay | Admitting: Family Medicine

## 2015-01-26 DIAGNOSIS — M11231 Other chondrocalcinosis, right wrist: Secondary | ICD-10-CM | POA: Diagnosis not present

## 2015-01-26 DIAGNOSIS — Z8546 Personal history of malignant neoplasm of prostate: Secondary | ICD-10-CM | POA: Diagnosis not present

## 2015-02-12 DIAGNOSIS — H353132 Nonexudative age-related macular degeneration, bilateral, intermediate dry stage: Secondary | ICD-10-CM | POA: Diagnosis not present

## 2015-02-12 DIAGNOSIS — H3562 Retinal hemorrhage, left eye: Secondary | ICD-10-CM | POA: Diagnosis not present

## 2015-02-19 DIAGNOSIS — L308 Other specified dermatitis: Secondary | ICD-10-CM | POA: Diagnosis not present

## 2015-02-21 ENCOUNTER — Encounter: Payer: Self-pay | Admitting: Family Medicine

## 2015-02-21 ENCOUNTER — Other Ambulatory Visit: Payer: Self-pay

## 2015-02-21 MED ORDER — LOSARTAN POTASSIUM 100 MG PO TABS: 100.0000 mg | ORAL_TABLET | Freq: Every day | ORAL | Status: DC

## 2015-03-28 DIAGNOSIS — M19031 Primary osteoarthritis, right wrist: Secondary | ICD-10-CM | POA: Diagnosis not present

## 2015-04-25 DIAGNOSIS — M19131 Post-traumatic osteoarthritis, right wrist: Secondary | ICD-10-CM | POA: Insufficient documentation

## 2015-05-08 ENCOUNTER — Encounter: Payer: Self-pay | Admitting: Gastroenterology

## 2015-05-22 DIAGNOSIS — L0100 Impetigo, unspecified: Secondary | ICD-10-CM | POA: Diagnosis not present

## 2015-05-22 DIAGNOSIS — L300 Nummular dermatitis: Secondary | ICD-10-CM | POA: Diagnosis not present

## 2015-05-22 DIAGNOSIS — L0889 Other specified local infections of the skin and subcutaneous tissue: Secondary | ICD-10-CM | POA: Diagnosis not present

## 2015-06-06 DIAGNOSIS — M19131 Post-traumatic osteoarthritis, right wrist: Secondary | ICD-10-CM | POA: Diagnosis not present

## 2015-06-21 ENCOUNTER — Encounter: Payer: Self-pay | Admitting: Gastroenterology

## 2015-08-07 ENCOUNTER — Ambulatory Visit (AMBULATORY_SURGERY_CENTER): Payer: Self-pay | Admitting: *Deleted

## 2015-08-07 VITALS — Ht 73.0 in | Wt 224.0 lb

## 2015-08-07 DIAGNOSIS — Z86010 Personal history of colonic polyps: Secondary | ICD-10-CM

## 2015-08-07 MED ORDER — NA SULFATE-K SULFATE-MG SULF 17.5-3.13-1.6 GM/177ML PO SOLN: ORAL | Status: DC

## 2015-08-07 NOTE — Progress Notes (Signed)
No egg or soy allergy; no home oxygen used  No anesthesia or intubation problems per pt  No diet medications taken

## 2015-08-14 ENCOUNTER — Encounter: Payer: Self-pay | Admitting: Gastroenterology

## 2015-08-20 DIAGNOSIS — H353132 Nonexudative age-related macular degeneration, bilateral, intermediate dry stage: Secondary | ICD-10-CM | POA: Diagnosis not present

## 2015-08-20 DIAGNOSIS — Z961 Presence of intraocular lens: Secondary | ICD-10-CM | POA: Diagnosis not present

## 2015-08-20 DIAGNOSIS — H35032 Hypertensive retinopathy, left eye: Secondary | ICD-10-CM | POA: Diagnosis not present

## 2015-08-28 ENCOUNTER — Ambulatory Visit (AMBULATORY_SURGERY_CENTER): Payer: Medicare Other | Admitting: Gastroenterology

## 2015-08-28 ENCOUNTER — Encounter: Payer: Self-pay | Admitting: Gastroenterology

## 2015-08-28 VITALS — BP 156/85 | HR 55 | Temp 97.5°F | Resp 14 | Ht 73.0 in | Wt 224.0 lb

## 2015-08-28 DIAGNOSIS — Z86010 Personal history of colonic polyps: Secondary | ICD-10-CM

## 2015-08-28 DIAGNOSIS — D123 Benign neoplasm of transverse colon: Secondary | ICD-10-CM | POA: Diagnosis not present

## 2015-08-28 MED ORDER — SODIUM CHLORIDE 0.9 % IV SOLN: 500.0000 mL | INTRAVENOUS | Status: DC

## 2015-08-28 NOTE — Op Note (Addendum)
[place] Patient Name: Jose Ware Procedure Date: 08/28/2015 11:02 AM MRN: [id number] Endoscopist: Ladene Artist , MD Age: 81 Referring MD:  Date of Birth: 05/14/1934 Gender: Male Account #: [id number] Procedure:                Colonoscopy Indications:              Surveillance: Piecemeal removal of large sessile                            tubulovillous adenoma last colonoscopy < 1 year ago Medicines:                Monitored Anesthesia Care Procedure:                Pre-Anesthesia Assessment:                           - Prior to the procedure, a History and Physical                            was performed, and patient medications and                            allergies were reviewed. The patient's tolerance of                            previous anesthesia was also reviewed. The risks                            and benefits of the procedure and the sedation                            options and risks were discussed with the patient.                            All questions were answered, and informed consent                            was obtained. Prior Anticoagulants: The patient has                            taken no previous anticoagulant or antiplatelet                            agents. ASA Grade Assessment: II - A patient with                            mild systemic disease. After reviewing the risks                            and benefits, the patient was deemed in                            satisfactory condition to undergo the procedure.  After obtaining informed consent, the colonoscope                            was passed under direct vision. Throughout the                            procedure, the patient's blood pressure, pulse, and                            oxygen saturations were monitored continuously. The                            Model PCF-H190L [id number]) scope was introduced                             through the anus and advanced to the the cecum,                            identified by appendiceal orifice and ileocecal                            valve. The colonoscopy was performed without                            difficulty. The patient tolerated the procedure                            well. The quality of the bowel preparation was                            adequate to identify polyps 6 mm and larger in                            size. The ileocecal valve, appendiceal orifice, and                            rectum were photographed. Scope In: 11:10:09 AM Scope Out: 11:33:28 AM Scope Withdrawal Time: 0 hours 21 minutes 50 seconds  Total Procedure Duration: 0 hours 23 minutes 19 seconds  Findings:                 The digital rectal exam was normal.                           A 15 mm polyp was found in the transverse colon at                            the site of the prior piecemeal polypectomy. The                            polyp was sessile, located behind a fold at a sharp                            turn in  the colon. Area was successfully injected                            with 8 mL saline for a lift polypectomy. The polyp                            was removed with a piecemeal technique using a hot                            snare. Resection appeared to complete, and                            retrieval was complete.                           Two tattoos were noted in the transverse colon                            located proximal and distal to the 15 mm polyp                            described above.                           A 6 mm polyp was found in the proximal transverse                            colon. The polyp was sessile. The polyp was removed                            with a cold snare. Resection and retrieval were                            complete.                           A few medium-mouthed diverticula were found in the                             sigmoid colon. There was no evidence of                            diverticular bleeding.                           Internal hemorrhoids were found during                            retroflexion. The hemorrhoids were small and Grade                            I (internal hemorrhoids that do not prolapse).                           The  exam was otherwise normal throughout the                            examined colon. Complications:            No immediate complications. Estimated Blood Loss:     Estimated blood loss was minimal. Impression:               - One 15 mm polyp in the transverse colon, removed                            piecemeal using a hot snare. Resected and                            retrieved. Injected.                           - Two tattoos were noted in the transverse colon.                           - One 6 mm polyp in the proximal transverse colon,                            removed with a cold snare. Resected and retrieved.                           - Mild diverticulosis in the sigmoid colon.                           - Internal hemorrhoids. Recommendation:           - Patient has a contact number available for                            emergencies. The signs and symptoms of potential                            delayed complications were discussed with the                            patient. Return to normal activities tomorrow.                            Written discharge instructions were provided to the                            patient.                           - Resume previous diet.                           - Continue present medications.                           - No aspirin, ibuprofen, naproxen, or other  non-steroidal anti-inflammatory drugs for 2 weeks                            after polyp removal.                           - Await pathology results.                           - Repeat colonoscopy in 1 year for surveillance                             after piecemeal polypectomy. Ladene Artist, MD 08/28/2015 11:44:38 AM This report has been signed electronically.

## 2015-08-28 NOTE — Progress Notes (Signed)
Called to room to assist during endoscopic procedure.  Patient ID and intended procedure confirmed with present staff. Received instructions for my participation in the procedure from the performing physician.  

## 2015-08-28 NOTE — Progress Notes (Signed)
To recovery, report to Myers, RN, VSS. 

## 2015-08-28 NOTE — Patient Instructions (Signed)
YOU HAD AN ENDOSCOPIC PROCEDURE TODAY AT [facility]:   Refer to the procedure report that was given to you for any specific questions about what was found during the examination.  If the procedure report does not answer your questions, please call your gastroenterologist to clarify.  If you requested that your care partner not be given the details of your procedure findings, then the procedure report has been included in a sealed envelope for you to review at your convenience later.  YOU SHOULD EXPECT: Some feelings of bloating in the abdomen. Passage of more gas than usual.  Walking can help get rid of the air that was put into your GI tract during the procedure and reduce the bloating. If you had a lower endoscopy (such as a colonoscopy or flexible sigmoidoscopy) you may notice spotting of blood in your stool or on the toilet paper. If you underwent a bowel prep for your procedure, you may not have a normal bowel movement for a few days.  Please Note:  You might notice some irritation and congestion in your nose or some drainage.  This is from the oxygen used during your procedure.  There is no need for concern and it should clear up in a day or so.  SYMPTOMS TO REPORT IMMEDIATELY:   Following lower endoscopy (colonoscopy or flexible sigmoidoscopy):  Excessive amounts of blood in the stool  Significant tenderness or worsening of abdominal pains  Swelling of the abdomen that is new, acute  Fever of 100F or higher  For urgent or emergent issues, a gastroenterologist can be reached at any hour by calling [phone].   DIET: Your first meal following the procedure should be a small meal and then it is ok to progress to your normal diet. Heavy or fried foods are harder to digest and may make you feel nauseous or bloated.  Likewise, meals heavy in dairy and vegetables can increase bloating.  Drink plenty of fluids but you should avoid alcoholic beverages for 24  hours.  ACTIVITY:  You should plan to take it easy for the rest of today and you should NOT DRIVE or use heavy machinery until tomorrow (because of the sedation medicines used during the test).    FOLLOW UP: Our staff will call the number listed on your records the next business day following your procedure to check on you and address any questions or concerns that you may have regarding the information given to you following your procedure. If we do not reach you, we will leave a message.  However, if you are feeling well and you are not experiencing any problems, there is no need to return our call.  We will assume that you have returned to your regular daily activities without incident.  If any biopsies were taken you will be contacted by phone or by letter within the next 1-3 weeks.  Please call us at [phone] if you have not heard about the biopsies in 3 weeks.    SIGNATURES/CONFIDENTIALITY: You and/or your care partner have signed paperwork which will be entered into your electronic medical record.  These signatures attest to the fact that that the information above on your After Visit Summary has been reviewed and is understood.  Full responsibility of the confidentiality of this discharge information lies with you and/or your care-partner.  No aspirin, ibuprofen, naproxen, aleve, or other non-steroidal anti-inflammatory drugs for 2 weeks after polyp removal. Next colonoscopy in 1 year. Please review polyp, diverticulosis,  high fiber diet, and hemorrhoid handouts provided.

## 2015-08-29 ENCOUNTER — Telehealth: Payer: Self-pay

## 2015-08-29 NOTE — Telephone Encounter (Signed)
Patient is on the list for [employer] 2017 and may be a good candidate for an AWV in 2017. Please let me know if/when appt is scheduled.   

## 2015-08-29 NOTE — Telephone Encounter (Addendum)
Error. Please disregard

## 2015-08-29 NOTE — Telephone Encounter (Signed)
  Follow up Call-  Call back number 08/28/2015 01/15/2015  Post procedure Call Back phone  # [phone]  Permission to leave phone message Yes Yes     Patient questions:  Do you have a fever, pain , or abdominal swelling? No. Pain Score  0 *  Have you tolerated food without any problems? Yes.    Have you been able to return to your normal activities? yes  Do you have any questions about your discharge instructions:  Diet   No. Medications  No. Follow up visit  No.  Do you have questions or concerns about your Care? No.  Actions: * If pain score is 4 or above: No action needed, pain <4.

## 2015-08-31 ENCOUNTER — Encounter: Payer: Self-pay | Admitting: Gastroenterology

## 2015-09-05 ENCOUNTER — Encounter: Payer: Self-pay | Admitting: Family Medicine

## 2015-09-14 ENCOUNTER — Ambulatory Visit (INDEPENDENT_AMBULATORY_CARE_PROVIDER_SITE_OTHER): Payer: Medicare Other | Admitting: Family Medicine

## 2015-09-14 ENCOUNTER — Encounter: Payer: Self-pay | Admitting: Family Medicine

## 2015-09-14 VITALS — BP 150/88 | HR 58 | Temp 98.4°F | Ht 73.0 in | Wt 224.0 lb

## 2015-09-14 DIAGNOSIS — I10 Essential (primary) hypertension: Secondary | ICD-10-CM | POA: Diagnosis not present

## 2015-09-14 DIAGNOSIS — Z23 Encounter for immunization: Secondary | ICD-10-CM | POA: Diagnosis not present

## 2015-09-14 MED ORDER — HYDROCHLOROTHIAZIDE 25 MG PO TABS: 25.0000 mg | ORAL_TABLET | Freq: Every day | ORAL | Status: DC

## 2015-09-14 NOTE — Assessment & Plan Note (Signed)
S: poorly controlled on amlodipine 5mg , losartan 100mg . We had reduced from 10mg  due to complaint by mychart of ankle swelling and heaviness. He states legs swell during the day and then they begin to feel heavy. He is wearing compression stockings.  BP Readings from Last 3 Encounters:  09/14/15 150/88  08/28/15 156/85  01/15/15 140/80  A/P:Continue current meds:  Will add hctz back as has had surgery for parathyroid gland which was likely cause of hypercalcemia. Follow up 1 month and will recheck BMET as well as blood pressure on new regimen.

## 2015-09-14 NOTE — Progress Notes (Signed)
Subjective:  Jose Ware is a 81 y.o. year old very pleasant male patient who presents for/with See problem oriented charting ROS- No chest pain or shortness of breath. No headache or blurry vision.see any ROS included in HPI as well.   Past Medical History-  Patient Active Problem List   Diagnosis Date Noted  . Primary hyperparathyroidism ([place]) 12/28/2013    Priority: High  . Fatigue 12/01/2013    Priority: High  . ADENOCARCINOMA, PROSTATE 12/14/2008    Priority: High  . PSORIASIS 10/15/2007    Priority: Medium  . Essential hypertension 11/27/2006    Priority: Medium  . History of stomach cancer 12/01/2013    Priority: Low  . Lower back pain 03/24/2013    Priority: Low  . Bradycardia 09/23/2012    Priority: Low  . Bruising 09/23/2012    Priority: Low  . B12 deficiency 09/17/2010    Priority: Low  . COLONIC POLYPS, HX OF 12/14/2008    Priority: Low  . PERIPHERAL NEUROPATHY 10/18/2007    Priority: Low  . DEGENERATIVE JOINT DISEASE 11/27/2006    Priority: Low  . NEPHROLITHIASIS, HX OF 11/27/2006    Priority: Low  . History of adenomatous polyp of colon 01/18/2015    Medications- reviewed and updated Current Outpatient Prescriptions  Medication Sig Dispense Refill  . amLODipine (NORVASC) 10 MG tablet Take 1 tablet (10 mg total) by mouth daily. (Patient taking differently: Take 5 mg by mouth daily. ) 30 tablet 5  . beta carotene w/minerals (OCUVITE) tablet Take 1 tablet by mouth daily.    Marland Kitchen losartan (COZAAR) 100 MG tablet Take 1 tablet (100 mg total) by mouth daily. 90 tablet 3  . vitamin B-12 (CYANOCOBALAMIN) 500 MCG tablet Take 5,000 mcg by mouth daily.      No current facility-administered medications for this visit.    Objective: BP 150/88 mmHg  Pulse 58  Temp(Src) 98.4 F (36.9 C) (Oral)  Ht 6\' 1"  (1.854 m)  Wt 224 lb (101.606 kg)  BMI 29.56 kg/m2  SpO2 92% Gen: NAD, resting comfortably CV: RRR no murmurs rubs or gallops Lungs: CTAB no crackles,  wheeze, rhonchi Abdomen: soft/nontender/nondistended/normal bowel sounds. No rebound or guarding.  Ext: trace edema under compression stockings Skin: warm, dry Neuro: grossly normal, moves all extremities  Assessment/Plan:  Essential hypertension S: poorly controlled on amlodipine 5mg , losartan 100mg . We had reduced from 10mg  due to complaint by mychart of ankle swelling and heaviness. He states legs swell during the day and then they begin to feel heavy. He is wearing compression stockings.  BP Readings from Last 3 Encounters:  09/14/15 150/88  08/28/15 156/85  01/15/15 140/80  A/P:Continue current meds:  Will add hctz back as has had surgery for parathyroid gland which was likely cause of hypercalcemia. Follow up 1 month and will recheck BMET as well as blood pressure on new regimen.     Return in about 4 weeks (around 10/12/2015).  Orders Placed This Encounter  Procedures  . Pneumococcal conjugate vaccine 13-valent IM    Meds ordered this encounter  Medications  . hydrochlorothiazide (HYDRODIURIL) 25 MG tablet    Sig: Take 1 tablet (25 mg total) by mouth daily.    Dispense:  30 tablet    Refill:  5    Return precautions advised.  Garret Reddish, MD

## 2015-09-14 NOTE — Progress Notes (Signed)
Pre visit review using our clinic review tool, if applicable. No additional management support is needed unless otherwise documented below in the visit note. 

## 2015-09-14 NOTE — Patient Instructions (Addendum)
Let's continue half tablet of amlodipine (so 5mg )  Continue full tab of losartan 100mg   Add HCTZ back in now that your parathyroid has been treated. Follow up in 1 month we will repeat labs and check blood pressure  Continue to elevate legs when able since that seems to help, continue compression stockings  Prevnar 13 today

## 2015-09-17 ENCOUNTER — Encounter: Payer: Self-pay | Admitting: Family Medicine

## 2015-09-18 ENCOUNTER — Other Ambulatory Visit: Payer: Self-pay | Admitting: General Practice

## 2015-09-18 MED ORDER — AMLODIPINE BESYLATE 5 MG PO TABS: 5.0000 mg | ORAL_TABLET | Freq: Every day | ORAL | Status: DC

## 2015-10-24 ENCOUNTER — Ambulatory Visit (INDEPENDENT_AMBULATORY_CARE_PROVIDER_SITE_OTHER)
Admission: RE | Admit: 2015-10-24 | Discharge: 2015-10-24 | Disposition: A | Discharge status: Home / Self Care | Payer: Medicare Other | Source: Ambulatory Visit | Attending: Family Medicine | Admitting: Family Medicine

## 2015-10-24 ENCOUNTER — Encounter: Payer: Self-pay | Admitting: Family Medicine

## 2015-10-24 ENCOUNTER — Ambulatory Visit (INDEPENDENT_AMBULATORY_CARE_PROVIDER_SITE_OTHER): Payer: Medicare Other | Admitting: Family Medicine

## 2015-10-24 VITALS — BP 148/78 | HR 60 | Wt 225.0 lb

## 2015-10-24 DIAGNOSIS — M545 Low back pain, unspecified: Secondary | ICD-10-CM

## 2015-10-24 DIAGNOSIS — R29898 Other symptoms and signs involving the musculoskeletal system: Secondary | ICD-10-CM | POA: Diagnosis not present

## 2015-10-24 DIAGNOSIS — I10 Essential (primary) hypertension: Secondary | ICD-10-CM | POA: Diagnosis not present

## 2015-10-24 DIAGNOSIS — Z23 Encounter for immunization: Secondary | ICD-10-CM | POA: Diagnosis not present

## 2015-10-24 DIAGNOSIS — M5136 Other intervertebral disc degeneration, lumbar region: Secondary | ICD-10-CM | POA: Diagnosis not present

## 2015-10-24 LAB — BASIC METABOLIC PANEL
BUN: 23 mg/dL (ref 6–23)
CALCIUM: 9.1 mg/dL (ref 8.4–10.5)
CHLORIDE: 101 meq/L (ref 96–112)
CO2: 34 meq/L — AB (ref 19–32)
CREATININE: 0.94 mg/dL (ref 0.40–1.50)
GFR: 82.19 mL/min (ref >60.00)
GLUCOSE: 89 mg/dL (ref 70–99)
Potassium: 2.9 mEq/L — ABNORMAL LOW (ref 3.5–5.1)
Sodium: 141 mEq/L (ref 135–145)

## 2015-10-24 LAB — CBC
HEMATOCRIT: 41.6 % (ref 39.0–52.0)
HEMOGLOBIN: 14.1 g/dL (ref 13.0–17.0)
MCHC: 33.9 g/dL (ref 30.0–36.0)
MCV: 94.6 fl (ref 78.0–100.0)
Platelets: 315 10*3/uL (ref 150.0–400.0)
RBC: 4.4 Mil/uL (ref 4.22–5.81)
RDW: 15.2 % (ref 11.5–15.5)
WBC: 10.4 10*3/uL (ref 4.0–10.5)

## 2015-10-24 MED ORDER — AMLODIPINE BESYLATE 10 MG PO TABS: 10.0000 mg | ORAL_TABLET | Freq: Every day | ORAL | 3 refills | Status: DC

## 2015-10-24 NOTE — Progress Notes (Signed)
Subjective:  Jose Ware is a 81 y.o. year old very pleasant male patient who presents for/with See problem oriented charting ROS- low back pain and bilateral leg weakness, no chest pain or shortness of breath.see any ROS included in HPI as well.   Past Medical History-  Patient Active Problem List   Diagnosis Date Noted  . Primary hyperparathyroidism ([place]) 12/28/2013    Priority: High  . Fatigue 12/01/2013    Priority: High  . ADENOCARCINOMA, PROSTATE 12/14/2008    Priority: High  . PSORIASIS 10/15/2007    Priority: Medium  . Essential hypertension 11/27/2006    Priority: Medium  . History of stomach cancer 12/01/2013    Priority: Low  . Lower back pain 03/24/2013    Priority: Low  . Bradycardia 09/23/2012    Priority: Low  . Bruising 09/23/2012    Priority: Low  . B12 deficiency 09/17/2010    Priority: Low  . COLONIC POLYPS, HX OF 12/14/2008    Priority: Low  . PERIPHERAL NEUROPATHY 10/18/2007    Priority: Low  . DEGENERATIVE JOINT DISEASE 11/27/2006    Priority: Low  . NEPHROLITHIASIS, HX OF 11/27/2006    Priority: Low  . History of adenomatous polyp of colon 01/18/2015    Medications- reviewed and updated Current Outpatient Prescriptions  Medication Sig Dispense Refill  . amLODipine (NORVASC) 5 MG tablet Take 1 tablet (5 mg total) by mouth daily. 90 tablet 3  . beta carotene w/minerals (OCUVITE) tablet Take 1 tablet by mouth daily.    . hydrochlorothiazide (HYDRODIURIL) 25 MG tablet Take 1 tablet (25 mg total) by mouth daily. 30 tablet 5  . losartan (COZAAR) 100 MG tablet Take 1 tablet (100 mg total) by mouth daily. 90 tablet 3  . vitamin B-12 (CYANOCOBALAMIN) 500 MCG tablet Take 5,000 mcg by mouth daily.      No current facility-administered medications for this visit.     Objective: BP (!) 148/78 (BP Location: Left Arm, Cuff Size: Normal)   Pulse 60   Wt 225 lb (102.1 kg)   SpO2 97%   BMI 29.69 kg/m  Gen: NAD, resting comfortably CV: RRR no  murmurs rubs or gallops Lungs: CTAB no crackles, wheeze, rhonchi Abdomen: soft/nontender/nondistended/normal bowel sounds. No rebound or guarding.  Ext: no edema Back - Normal skin, Spine with normal alignment and no deformity.  No tenderness to vertebral process palpation.  Paraspinous muscles are not tender and without spasm.   Range of motion is full at neck and lumbar sacral regions. Negative Straight leg raise.  Neuro- no saddle anesthesia, 5/5 strength lower extremities, 2+ reflexes, appears to have somewhat antalgic gait Skin: warm, dry  Assessment/Plan:  Hypertension Back Pain Leg weakness S: controlled on amlodipine 5mg  and hctz 25 mg as well as losartan 100mg . Had reduced amlodipine due to ankle swelling.   Patient also has complaint of losing strength in legs as day goes on usually around 3 pm and a little fatigue. Urgency in toileting BP Readings from Last 3 Encounters:  10/24/15 (!) 148/78  09/14/15 (!) 150/88  08/28/15 (!) 156/85  A/P:Continue current meds:  But increase amlodipine back to 10mg , hopeful that with hctz back on board edema can tolerate this. Also check BMET to check in on calcium. Considered beta blocker but worry abotu HR being only at 60.   For back pain and leg weakness- wonder if spinal stenosis could be at play- staty with x-ray spine. Consider MRI vs. Ortho visit  Flu shot today given  Orders Placed This Encounter  Procedures  . DG Lumbar Spine Complete    Standing Status:   Future    Standing Expiration Date:   12/23/2016    Order Specific Question:   Reason for Exam (SYMPTOM  OR DIAGNOSIS REQUIRED)    Answer:   low back pain in afternoon followed by leg weakness. always worse with standing- ? spinal stenosis    Order Specific Question:   Preferred imaging location?    Answer:   [facility]  . Flu Vaccine QUAD 36+ mos IM  . CBC    [place]  . Basic metabolic panel    [facility]    Meds ordered this encounter  Medications  . amLODipine  (NORVASC) 10 MG tablet    Sig: Take 1 tablet (10 mg total) by mouth daily.    Dispense:  90 tablet    Refill:  3    Return precautions advised.  Garret Reddish, MD

## 2015-10-24 NOTE — Patient Instructions (Signed)
Wonder if arthritis in spine (spinal stenosis potentially could be causing your symptoms)- get x-ray today  Stop by lab before you leave  Increase amlodipine to 10mg  and let me know how your blood pressure is doing. If your swelling doesn't get too bad will continue, otherwise will tolerate blood pressure as long as under 150/90 but really would prefer lower

## 2015-10-25 ENCOUNTER — Other Ambulatory Visit: Payer: Self-pay | Admitting: Emergency Medicine

## 2015-10-25 ENCOUNTER — Other Ambulatory Visit: Payer: Self-pay | Admitting: Family Medicine

## 2015-10-25 DIAGNOSIS — R531 Weakness: Secondary | ICD-10-CM

## 2015-10-25 DIAGNOSIS — M1990 Unspecified osteoarthritis, unspecified site: Secondary | ICD-10-CM

## 2015-10-25 DIAGNOSIS — R29898 Other symptoms and signs involving the musculoskeletal system: Secondary | ICD-10-CM

## 2015-10-25 MED ORDER — POTASSIUM CHLORIDE CRYS ER 10 MEQ PO TBCR: 10.0000 meq | EXTENDED_RELEASE_TABLET | Freq: Every day | ORAL | 5 refills | Status: DC

## 2015-10-25 NOTE — Progress Notes (Signed)
Called patient's phone number which is only number we have and it just rang, no answering machine.

## 2015-10-25 NOTE — Progress Notes (Signed)
[facility] order

## 2015-10-29 DIAGNOSIS — M4806 Spinal stenosis, lumbar region: Secondary | ICD-10-CM | POA: Diagnosis not present

## 2015-11-02 DIAGNOSIS — M545 Low back pain: Secondary | ICD-10-CM | POA: Diagnosis not present

## 2015-11-04 ENCOUNTER — Emergency Department (HOSPITAL_COMMUNITY): Payer: Medicare Other

## 2015-11-04 ENCOUNTER — Inpatient Hospital Stay (HOSPITAL_COMMUNITY): Payer: Medicare Other

## 2015-11-04 ENCOUNTER — Encounter (HOSPITAL_COMMUNITY): Payer: Self-pay | Admitting: *Deleted

## 2015-11-04 ENCOUNTER — Encounter (HOSPITAL_COMMUNITY)
Admission: EM | Disposition: A | Discharge status: Home / Self Care | Payer: Self-pay | Source: Home / Self Care | Attending: Cardiology

## 2015-11-04 ENCOUNTER — Inpatient Hospital Stay (HOSPITAL_COMMUNITY)
Admission: EM | Admit: 2015-11-04 | Discharge: 2015-11-08 | DRG: 246 | Disposition: A | Discharge status: Home / Self Care | Payer: Medicare Other | Attending: Cardiology | Admitting: Cardiology

## 2015-11-04 DIAGNOSIS — R001 Bradycardia, unspecified: Secondary | ICD-10-CM | POA: Diagnosis not present

## 2015-11-04 DIAGNOSIS — Z91030 Bee allergy status: Secondary | ICD-10-CM | POA: Diagnosis not present

## 2015-11-04 DIAGNOSIS — Z79899 Other long term (current) drug therapy: Secondary | ICD-10-CM

## 2015-11-04 DIAGNOSIS — G629 Polyneuropathy, unspecified: Secondary | ICD-10-CM | POA: Diagnosis not present

## 2015-11-04 DIAGNOSIS — I2111 ST elevation (STEMI) myocardial infarction involving right coronary artery: Secondary | ICD-10-CM | POA: Diagnosis not present

## 2015-11-04 DIAGNOSIS — Z9049 Acquired absence of other specified parts of digestive tract: Secondary | ICD-10-CM | POA: Diagnosis not present

## 2015-11-04 DIAGNOSIS — E876 Hypokalemia: Secondary | ICD-10-CM | POA: Diagnosis not present

## 2015-11-04 DIAGNOSIS — Z8249 Family history of ischemic heart disease and other diseases of the circulatory system: Secondary | ICD-10-CM

## 2015-11-04 DIAGNOSIS — I10 Essential (primary) hypertension: Secondary | ICD-10-CM | POA: Diagnosis present

## 2015-11-04 DIAGNOSIS — E210 Primary hyperparathyroidism: Secondary | ICD-10-CM | POA: Diagnosis not present

## 2015-11-04 DIAGNOSIS — I2119 ST elevation (STEMI) myocardial infarction involving other coronary artery of inferior wall: Secondary | ICD-10-CM | POA: Diagnosis not present

## 2015-11-04 DIAGNOSIS — I5021 Acute systolic (congestive) heart failure: Secondary | ICD-10-CM | POA: Diagnosis not present

## 2015-11-04 DIAGNOSIS — Z8673 Personal history of transient ischemic attack (TIA), and cerebral infarction without residual deficits: Secondary | ICD-10-CM | POA: Diagnosis present

## 2015-11-04 DIAGNOSIS — I639 Cerebral infarction, unspecified: Secondary | ICD-10-CM | POA: Diagnosis not present

## 2015-11-04 DIAGNOSIS — I2510 Atherosclerotic heart disease of native coronary artery without angina pectoris: Secondary | ICD-10-CM | POA: Diagnosis not present

## 2015-11-04 DIAGNOSIS — I6340 Cerebral infarction due to embolism of unspecified cerebral artery: Secondary | ICD-10-CM | POA: Diagnosis not present

## 2015-11-04 DIAGNOSIS — R079 Chest pain, unspecified: Secondary | ICD-10-CM

## 2015-11-04 DIAGNOSIS — I255 Ischemic cardiomyopathy: Secondary | ICD-10-CM | POA: Diagnosis not present

## 2015-11-04 DIAGNOSIS — Z7982 Long term (current) use of aspirin: Secondary | ICD-10-CM | POA: Diagnosis not present

## 2015-11-04 DIAGNOSIS — Z8546 Personal history of malignant neoplasm of prostate: Secondary | ICD-10-CM | POA: Diagnosis not present

## 2015-11-04 DIAGNOSIS — Z86010 Personal history of colonic polyps: Secondary | ICD-10-CM

## 2015-11-04 DIAGNOSIS — I472 Ventricular tachycardia: Secondary | ICD-10-CM | POA: Diagnosis not present

## 2015-11-04 DIAGNOSIS — Z888 Allergy status to other drugs, medicaments and biological substances status: Secondary | ICD-10-CM

## 2015-11-04 DIAGNOSIS — Z9861 Coronary angioplasty status: Secondary | ICD-10-CM

## 2015-11-04 DIAGNOSIS — Z955 Presence of coronary angioplasty implant and graft: Secondary | ICD-10-CM

## 2015-11-04 DIAGNOSIS — I69322 Dysarthria following cerebral infarction: Secondary | ICD-10-CM | POA: Diagnosis not present

## 2015-11-04 DIAGNOSIS — I25118 Atherosclerotic heart disease of native coronary artery with other forms of angina pectoris: Secondary | ICD-10-CM | POA: Diagnosis not present

## 2015-11-04 DIAGNOSIS — I110 Hypertensive heart disease with heart failure: Secondary | ICD-10-CM | POA: Diagnosis present

## 2015-11-04 DIAGNOSIS — I2121 ST elevation (STEMI) myocardial infarction involving left circumflex coronary artery: Secondary | ICD-10-CM | POA: Diagnosis not present

## 2015-11-04 DIAGNOSIS — Z85028 Personal history of other malignant neoplasm of stomach: Secondary | ICD-10-CM

## 2015-11-04 DIAGNOSIS — L408 Other psoriasis: Secondary | ICD-10-CM | POA: Diagnosis present

## 2015-11-04 DIAGNOSIS — Z923 Personal history of irradiation: Secondary | ICD-10-CM

## 2015-11-04 DIAGNOSIS — R0789 Other chest pain: Secondary | ICD-10-CM | POA: Diagnosis present

## 2015-11-04 DIAGNOSIS — Z8679 Personal history of other diseases of the circulatory system: Secondary | ICD-10-CM

## 2015-11-04 DIAGNOSIS — Z87891 Personal history of nicotine dependence: Secondary | ICD-10-CM | POA: Diagnosis not present

## 2015-11-04 HISTORY — DX: Atherosclerotic heart disease of native coronary artery without angina pectoris: I25.10

## 2015-11-04 HISTORY — DX: Cerebral infarction, unspecified: I63.9

## 2015-11-04 HISTORY — DX: Ischemic cardiomyopathy: I25.5

## 2015-11-04 HISTORY — PX: CARDIAC CATHETERIZATION: SHX172

## 2015-11-04 LAB — I-STAT TROPONIN, ED: TROPONIN I, POC: 0.44 ng/mL — AB (ref 0.00–0.08)

## 2015-11-04 LAB — CBC WITH DIFFERENTIAL/PLATELET
Basophils Absolute: 0 10*3/uL (ref 0.0–0.1)
Basophils Relative: 0 %
EOS ABS: 0.2 10*3/uL (ref 0.0–0.7)
Eosinophils Relative: 2 %
HEMATOCRIT: 42.5 % (ref 39.0–52.0)
HEMOGLOBIN: 14.8 g/dL (ref 13.0–17.0)
LYMPHS ABS: 1.4 10*3/uL (ref 0.7–4.0)
Lymphocytes Relative: 11 %
MCH: 33 pg (ref 26.0–34.0)
MCHC: 34.8 g/dL (ref 30.0–36.0)
MCV: 94.7 fL (ref 78.0–100.0)
MONOS PCT: 7 %
Monocytes Absolute: 0.9 10*3/uL (ref 0.1–1.0)
NEUTROS ABS: 9.6 10*3/uL — AB (ref 1.7–7.7)
NEUTROS PCT: 80 %
Platelets: 282 10*3/uL (ref 150–400)
RBC: 4.49 MIL/uL (ref 4.22–5.81)
RDW: 14.9 % (ref 11.5–15.5)
WBC: 12 10*3/uL — AB (ref 4.0–10.5)

## 2015-11-04 LAB — BASIC METABOLIC PANEL
Anion gap: 9 (ref 5–15)
BUN: 35 mg/dL — AB (ref 6–20)
CHLORIDE: 101 mmol/L (ref 101–111)
CO2: 26 mmol/L (ref 22–32)
Calcium: 9.1 mg/dL (ref 8.9–10.3)
Creatinine, Ser: 1.03 mg/dL (ref 0.61–1.24)
GFR calc non Af Amer: >60 mL/min (ref >60)
Glucose, Bld: 132 mg/dL — ABNORMAL HIGH (ref 65–99)
POTASSIUM: 3.7 mmol/L (ref 3.5–5.1)
SODIUM: 136 mmol/L (ref 135–145)

## 2015-11-04 LAB — TROPONIN I: Troponin I: >65 ng/mL (ref <0.03)

## 2015-11-04 LAB — COMPREHENSIVE METABOLIC PANEL
ALBUMIN: 3.5 g/dL (ref 3.5–5.0)
ALK PHOS: 67 U/L (ref 38–126)
ALT: 39 U/L (ref 17–63)
AST: 194 U/L — ABNORMAL HIGH (ref 15–41)
Anion gap: 10 (ref 5–15)
BILIRUBIN TOTAL: 1.3 mg/dL — AB (ref 0.3–1.2)
BUN: 28 mg/dL — ABNORMAL HIGH (ref 6–20)
CALCIUM: 8.6 mg/dL — AB (ref 8.9–10.3)
CO2: 24 mmol/L (ref 22–32)
CREATININE: 0.94 mg/dL (ref 0.61–1.24)
Chloride: 97 mmol/L — ABNORMAL LOW (ref 101–111)
GFR calc Af Amer: >60 mL/min (ref >60)
GFR calc non Af Amer: >60 mL/min (ref >60)
GLUCOSE: 103 mg/dL — AB (ref 65–99)
Potassium: 3.4 mmol/L — ABNORMAL LOW (ref 3.5–5.1)
SODIUM: 131 mmol/L — AB (ref 135–145)
TOTAL PROTEIN: 6.8 g/dL (ref 6.5–8.1)

## 2015-11-04 LAB — CBC
HCT: 42.8 % (ref 39.0–52.0)
Hemoglobin: 14.4 g/dL (ref 13.0–17.0)
MCH: 32.4 pg (ref 26.0–34.0)
MCHC: 33.6 g/dL (ref 30.0–36.0)
MCV: 96.4 fL (ref 78.0–100.0)
PLATELETS: 275 10*3/uL (ref 150–400)
RBC: 4.44 MIL/uL (ref 4.22–5.81)
RDW: 14.9 % (ref 11.5–15.5)
WBC: 12.7 10*3/uL — AB (ref 4.0–10.5)

## 2015-11-04 LAB — CREATININE, SERUM: CREATININE: 0.98 mg/dL (ref 0.61–1.24)

## 2015-11-04 LAB — PROTIME-INR
INR: 1.08
PROTHROMBIN TIME: 14.1 s (ref 11.4–15.2)

## 2015-11-04 LAB — MRSA PCR SCREENING: MRSA BY PCR: NEGATIVE

## 2015-11-04 LAB — MAGNESIUM: Magnesium: 1.9 mg/dL (ref 1.7–2.4)

## 2015-11-04 LAB — PLATELET COUNT: Platelets: 268 10*3/uL (ref 150–400)

## 2015-11-04 SURGERY — LEFT HEART CATH AND CORONARY ANGIOGRAPHY
Anesthesia: LOCAL

## 2015-11-04 MED ORDER — IOPAMIDOL (ISOVUE-370) INJECTION 76%
INTRAVENOUS | Status: AC
  Filled 2015-11-04: qty 125

## 2015-11-04 MED ORDER — LIDOCAINE HCL (PF) 1 % IJ SOLN
INTRAMUSCULAR | Status: DC | PRN
  Administered 2015-11-04: 2 mL

## 2015-11-04 MED ORDER — TICAGRELOR 90 MG PO TABS
90.0000 mg | ORAL_TABLET | Freq: Two times a day (BID) | ORAL | Status: DC
  Administered 2015-11-04 – 2015-11-08 (×8): 90 mg via ORAL
  Filled 2015-11-04 (×8): qty 1

## 2015-11-04 MED ORDER — OCUVITE-LUTEIN PO CAPS
1.0000 | ORAL_CAPSULE | Freq: Every day | ORAL | Status: DC
  Administered 2015-11-05: 1 via ORAL
  Filled 2015-11-04 (×2): qty 1

## 2015-11-04 MED ORDER — HEPARIN (PORCINE) IN NACL 100-0.45 UNIT/ML-% IJ SOLN
1300.0000 [IU]/h | INTRAMUSCULAR | Status: DC
  Filled 2015-11-04: qty 250

## 2015-11-04 MED ORDER — ATORVASTATIN CALCIUM 80 MG PO TABS
80.0000 mg | ORAL_TABLET | Freq: Every day | ORAL | Status: DC
  Administered 2015-11-04 – 2015-11-07 (×4): 80 mg via ORAL
  Filled 2015-11-04 (×4): qty 1

## 2015-11-04 MED ORDER — HEPARIN SODIUM (PORCINE) 5000 UNIT/ML IJ SOLN
4000.0000 [IU] | Freq: Once | INTRAMUSCULAR | Status: AC
  Administered 2015-11-04: 4000 [IU] via INTRAVENOUS

## 2015-11-04 MED ORDER — OCUVITE PO TABS: 1.0000 | ORAL_TABLET | Freq: Every day | ORAL | Status: DC

## 2015-11-04 MED ORDER — HEPARIN (PORCINE) IN NACL 2-0.9 UNIT/ML-% IJ SOLN
INTRAMUSCULAR | Status: AC
  Filled 2015-11-04: qty 1500

## 2015-11-04 MED ORDER — VERAPAMIL HCL 2.5 MG/ML IV SOLN
INTRAVENOUS | Status: DC | PRN
  Administered 2015-11-04: 14:00:00 via INTRA_ARTERIAL

## 2015-11-04 MED ORDER — PANTOPRAZOLE SODIUM 40 MG PO TBEC
40.0000 mg | DELAYED_RELEASE_TABLET | Freq: Every day | ORAL | Status: DC
  Administered 2015-11-05 – 2015-11-08 (×3): 40 mg via ORAL
  Filled 2015-11-04 (×3): qty 1

## 2015-11-04 MED ORDER — NITROGLYCERIN 1 MG/10 ML FOR IR/CATH LAB
INTRA_ARTERIAL | Status: DC | PRN
  Administered 2015-11-04: 200 ug via INTRACORONARY

## 2015-11-04 MED ORDER — TIROFIBAN HCL IN NACL 5-0.9 MG/100ML-% IV SOLN
0.0750 ug/kg/min | INTRAVENOUS | Status: AC
  Administered 2015-11-04 – 2015-11-05 (×2): 0.075 ug/kg/min via INTRAVENOUS
  Filled 2015-11-04 (×2): qty 100

## 2015-11-04 MED ORDER — ORAL CARE MOUTH RINSE
15.0000 mL | Freq: Two times a day (BID) | OROMUCOSAL | Status: DC
  Administered 2015-11-06 – 2015-11-08 (×3): 15 mL via OROMUCOSAL

## 2015-11-04 MED ORDER — TIROFIBAN HCL IN NACL 5-0.9 MG/100ML-% IV SOLN
INTRAVENOUS | Status: AC
  Filled 2015-11-04: qty 100

## 2015-11-04 MED ORDER — ONDANSETRON HCL 4 MG/2ML IJ SOLN: 4.0000 mg | Freq: Four times a day (QID) | INTRAMUSCULAR | Status: DC | PRN

## 2015-11-04 MED ORDER — SODIUM CHLORIDE 0.9 % IV SOLN: 250.0000 mL | INTRAVENOUS | Status: DC | PRN

## 2015-11-04 MED ORDER — IOPAMIDOL (ISOVUE-370) INJECTION 76%
INTRAVENOUS | Status: DC | PRN
  Administered 2015-11-04: 195 mL via INTRA_ARTERIAL

## 2015-11-04 MED ORDER — ENOXAPARIN SODIUM 40 MG/0.4ML ~~LOC~~ SOLN: 40.0000 mg | SUBCUTANEOUS | Status: DC

## 2015-11-04 MED ORDER — HEPARIN (PORCINE) IN NACL 100-0.45 UNIT/ML-% IJ SOLN: 1400.0000 [IU]/h | Freq: Once | INTRAMUSCULAR | Status: DC

## 2015-11-04 MED ORDER — ENOXAPARIN SODIUM 40 MG/0.4ML ~~LOC~~ SOLN
40.0000 mg | SUBCUTANEOUS | Status: DC
  Administered 2015-11-05 – 2015-11-08 (×3): 40 mg via SUBCUTANEOUS
  Filled 2015-11-04 (×3): qty 0.4

## 2015-11-04 MED ORDER — NITROGLYCERIN 0.4 MG SL SUBL: 0.4000 mg | SUBLINGUAL_TABLET | SUBLINGUAL | Status: DC | PRN

## 2015-11-04 MED ORDER — HEPARIN SODIUM (PORCINE) 1000 UNIT/ML IJ SOLN
INTRAMUSCULAR | Status: DC | PRN
  Administered 2015-11-04: 6000 [IU] via INTRAVENOUS
  Administered 2015-11-04: 2000 [IU] via INTRAVENOUS

## 2015-11-04 MED ORDER — HEPARIN SODIUM (PORCINE) 5000 UNIT/ML IJ SOLN: 4000.0000 [IU] | Freq: Once | INTRAMUSCULAR | Status: DC

## 2015-11-04 MED ORDER — TIROFIBAN HCL IN NACL 5-0.9 MG/100ML-% IV SOLN
INTRAVENOUS | Status: DC | PRN
  Administered 2015-11-04: 0.15 ug/kg/min via INTRAVENOUS

## 2015-11-04 MED ORDER — HEPARIN (PORCINE) IN NACL 2-0.9 UNIT/ML-% IJ SOLN
INTRAMUSCULAR | Status: DC | PRN
  Administered 2015-11-04 (×2): 1500 mL

## 2015-11-04 MED ORDER — CARVEDILOL 6.25 MG PO TABS
6.2500 mg | ORAL_TABLET | Freq: Two times a day (BID) | ORAL | Status: DC
  Administered 2015-11-05 – 2015-11-08 (×7): 6.25 mg via ORAL
  Filled 2015-11-04 (×7): qty 1

## 2015-11-04 MED ORDER — SODIUM CHLORIDE 0.9% FLUSH
3.0000 mL | Freq: Two times a day (BID) | INTRAVENOUS | Status: DC
  Administered 2015-11-05 – 2015-11-08 (×7): 3 mL via INTRAVENOUS

## 2015-11-04 MED ORDER — NITROGLYCERIN IN D5W 200-5 MCG/ML-% IV SOLN
5.0000 ug/min | Freq: Once | INTRAVENOUS | Status: AC
  Administered 2015-11-04: 10 ug/min via INTRAVENOUS
  Filled 2015-11-04: qty 250

## 2015-11-04 MED ORDER — VERAPAMIL HCL 2.5 MG/ML IV SOLN
INTRAVENOUS | Status: AC
  Filled 2015-11-04: qty 2

## 2015-11-04 MED ORDER — ASPIRIN 81 MG PO CHEW
81.0000 mg | CHEWABLE_TABLET | Freq: Every day | ORAL | Status: DC
  Administered 2015-11-05 – 2015-11-08 (×4): 81 mg via ORAL
  Filled 2015-11-04 (×4): qty 1

## 2015-11-04 MED ORDER — VITAMIN B-12 1000 MCG PO TABS
5000.0000 ug | ORAL_TABLET | Freq: Every day | ORAL | Status: DC
  Administered 2015-11-05 – 2015-11-08 (×3): 5000 ug via ORAL
  Filled 2015-11-04 (×2): qty 5
  Filled 2015-11-04: qty 50
  Filled 2015-11-04: qty 5

## 2015-11-04 MED ORDER — TICAGRELOR 90 MG PO TABS
ORAL_TABLET | ORAL | Status: AC
  Filled 2015-11-04: qty 2

## 2015-11-04 MED ORDER — TICAGRELOR 90 MG PO TABS
ORAL_TABLET | ORAL | Status: DC | PRN
  Administered 2015-11-04: 180 mg via ORAL

## 2015-11-04 MED ORDER — SODIUM CHLORIDE 0.9% FLUSH: 3.0000 mL | INTRAVENOUS | Status: DC | PRN

## 2015-11-04 MED ORDER — LIDOCAINE HCL (PF) 1 % IJ SOLN
INTRAMUSCULAR | Status: AC
  Filled 2015-11-04: qty 30

## 2015-11-04 MED ORDER — IOPAMIDOL (ISOVUE-370) INJECTION 76%
INTRAVENOUS | Status: AC
  Filled 2015-11-04: qty 100

## 2015-11-04 MED ORDER — HEPARIN (PORCINE) IN NACL 100-0.45 UNIT/ML-% IJ SOLN: 15.0000 [IU]/kg/h | Freq: Once | INTRAMUSCULAR | Status: DC

## 2015-11-04 MED ORDER — LOSARTAN POTASSIUM 50 MG PO TABS
100.0000 mg | ORAL_TABLET | Freq: Every day | ORAL | Status: DC
  Administered 2015-11-04 – 2015-11-08 (×4): 100 mg via ORAL
  Filled 2015-11-04 (×4): qty 2

## 2015-11-04 MED ORDER — NITROGLYCERIN 1 MG/10 ML FOR IR/CATH LAB
INTRA_ARTERIAL | Status: AC
  Filled 2015-11-04: qty 10

## 2015-11-04 MED ORDER — ACETAMINOPHEN 325 MG PO TABS: 650.0000 mg | ORAL_TABLET | ORAL | Status: DC | PRN

## 2015-11-04 MED ORDER — SODIUM CHLORIDE 0.9 % WEIGHT BASED INFUSION: 1.0000 mL/kg/h | INTRAVENOUS | Status: AC

## 2015-11-04 MED ORDER — TIROFIBAN (AGGRASTAT) BOLUS VIA INFUSION
INTRAVENOUS | Status: DC | PRN
  Administered 2015-11-04: 2552.5 ug via INTRAVENOUS

## 2015-11-04 SURGICAL SUPPLY — 22 items
BALLN EMERGE MR 2.0X20 (BALLOONS) ×2
BALLN EMERGE MR 2.5X15 (BALLOONS) ×2
BALLN ~~LOC~~ EUPHORA RX 4.5X20 (BALLOONS) ×2
BALLN ~~LOC~~ TREK RX 3.0X15 (BALLOONS) ×2
BALLOON EMERGE MR 2.0X20 (BALLOONS) IMPLANT
BALLOON EMERGE MR 2.5X15 (BALLOONS) IMPLANT
BALLOON ~~LOC~~ EUPHORA RX 4.5X20 (BALLOONS) IMPLANT
BALLOON ~~LOC~~ TREK RX 3.0X15 (BALLOONS) IMPLANT
CATH INFINITI 5 FR JL3.5 (CATHETERS) ×1 IMPLANT
CATH INFINITI 5FR MULTPACK ANG (CATHETERS) ×1 IMPLANT
CATH VISTA GUIDE 6FR XB3.5 (CATHETERS) ×1 IMPLANT
DEVICE RAD COMP TR BAND LRG (VASCULAR PRODUCTS) ×1 IMPLANT
GLIDESHEATH SLEND SS 6F .021 (SHEATH) ×1 IMPLANT
KIT ENCORE 26 ADVANTAGE (KITS) ×1 IMPLANT
KIT HEART LEFT (KITS) ×2 IMPLANT
PACK CARDIAC CATHETERIZATION (CUSTOM PROCEDURE TRAY) ×2 IMPLANT
STENT SYNERGY DES 4X38 (Permanent Stent) ×1 IMPLANT
SYR MEDRAD MARK V 150ML (SYRINGE) ×2 IMPLANT
TRANSDUCER W/STOPCOCK (MISCELLANEOUS) ×2 IMPLANT
TUBING CIL FLEX 10 FLL-RA (TUBING) ×2 IMPLANT
WIRE ASAHI PROWATER 180CM (WIRE) ×1 IMPLANT
WIRE SAFE-T 1.5MM-J .035X260CM (WIRE) ×1 IMPLANT

## 2015-11-04 NOTE — ED Triage Notes (Signed)
Pt presents today with left sided chest pain.  Pt reports the chest pain stays in one spot and does not radiate.  Pt also reports having left arm pain.  Pt reports chest pain started about 15 minutes prior to arrival.  Pt denies SOB and no diaphoresis noted.  Pt reports some back pain and nausea earlier.  Wife states pt had issues with balance this am.

## 2015-11-04 NOTE — ED Notes (Signed)
Abnormal Lab result MD Jeneen Rinks have been made aware

## 2015-11-04 NOTE — Progress Notes (Signed)
ANTICOAGULATION CONSULT NOTE - Initial Consult  Pharmacy Consult for IV heparin Indication: chest pain/ACS  Allergies  Allergen Reactions  . Betadine [Povidone Iodine]     blistering    Patient Measurements: Height: 6\' 1"  (185.4 cm) Weight: 225 lb (102.1 kg) IBW/kg (Calculated) : 79.9 Heparin Dosing Weight:   Vital Signs: Temp: 97.9 F (36.6 C) (08/27 1326) Temp Source: Oral (08/27 1326) BP: 158/78 (08/27 1326) Pulse Rate: 75 (08/27 1326)  Labs:  Recent Labs  11/04/15 1330  HGB 14.8  HCT 42.5  PLT 282  CREATININE 1.03    Estimated Creatinine Clearance: 73 mL/min (by C-G formula based on SCr of 1.03 mg/dL).   Medical History: Past Medical History:  Diagnosis Date  . Adenocarcinoma of prostate [facility])    XRT & Radiation seed implantation in 2010  . Adenomatous colon polyp   . Diverticulitis   . DIVERTICULITIS, HX OF 11/27/2006       . DJD (degenerative joint disease)    wrist - R   . Hernia   . History of blood transfusion 1985   with colon surgery  . Hypertension   . Nephrolithiasis   . NEPHROLITHIASIS, HX OF 11/27/2006        . Peripheral neuropathy ([place])   . Psoriasis   . Strep throat   . Tenosynovitis     Medications:   (Not in a hospital admission)  Assessment: Pharmacy is consulted to dose heparin in 81 yo male diagnosed with ACS. Not noted to be on any anticoagulation PTA.   Baseline labs on 8/27  Hgb, plt WNL  Scr 1.03, CrCl 58 ml/min (N)  Troponin at 0.44  Pt 14.1, INR 1.08    Goal of Therapy:  Heparin level 0.3-0.7 units/ml Monitor platelets by anticoagulation protocol: Yes   Plan:   Give heparin 4000 unit bolus then start heparin drip at 1300 units/hr.  HL 8 hours after infusion starts  Daily CBC, HL   Monitor for signs and symptoms of bleeding   Royetta Asal, PharmD, BCPS Pager [phone] 11/04/2015 2:04 PM

## 2015-11-04 NOTE — H&P (Signed)
Physician History and Physical    Patient ID: PRINTES LUTSKY MRN: [id number] DOB/AGE: Mar 31, 1934 81 y.o. Admit date: 11/04/2015  Primary Care Physician: Garret Reddish, MD Primary Cardiologist new  HPI: Mr. Ode is a pleasant 81 yo WM admitted with acute inferolateral STEMI. He has a history of HTN. He denies history of DM or hypercholesterolemia. He quit smoking in the 1980s. He was in his usual state of good health today when he developed acute onset of bilateral arm pain after eating brunch at 11 am. On the way to the ED he developed chest pain as well and was sweaty. Ecg showed > 2 mm ST elevation in the inferior and lateral leads. He denies any prior cardiac history. Allergy to betadine. Multiple abdominal surgeries in the past. No bleeding or history of CVA.   Review of systems complete and found to be negative unless listed above  Past Medical History:  Diagnosis Date  . Adenocarcinoma of prostate [facility])    XRT & Radiation seed implantation in 2010  . Adenomatous colon polyp   . Diverticulitis   . DIVERTICULITIS, HX OF 11/27/2006       . DJD (degenerative joint disease)    wrist - R   . Hernia   . History of blood transfusion 1985   with colon surgery  . Hypertension   . Nephrolithiasis   . NEPHROLITHIASIS, HX OF 11/27/2006        . Peripheral neuropathy ([place])   . Psoriasis   . Strep throat   . Tenosynovitis     Family History  Problem Relation Age of Onset  . Coronary artery disease Mother   . Alcohol abuse Father   . Cirrhosis Father   . Heart failure Sister   . Breast cancer Sister     W/involvement of right arm leading to amputation  . Coronary artery disease Brother   . Heart attack Brother   . Clotting disorder Brother   . Prostate cancer Neg Hx   . Colon cancer Neg Hx   . Diabetes Neg Hx   . Glaucoma Neg Hx   . Rectal cancer Neg Hx   . Esophageal cancer Neg Hx     Social History   Social History  . Marital status: Married    Spouse name: N/A   . Number of children: 3  . Years of education: N/A   Occupational History  . Not on file.   Social History Main Topics  . Smoking status: Former Smoker    Quit date: 03/11/1983  . Smokeless tobacco: Never Used  . Alcohol use 0.0 oz/week     Comment: RARE  . Drug use: No  . Sexual activity: Not on file   Other Topics Concern  . Not on file   Social History Narrative   [employer] [place], [place].   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 APPENDECTOMY  1985  . BACK SURGERY  1980   minor disk surgery: instrumentation placed and removed in a second procedure  .  CATARACT EXTRACTION, BILATERAL  2013  . CHOLECYSTECTOMY  1986  . COLON SURGERY  1980's   pt. had ileus, had colon surgery, requiring colostomy & then reversal & then dehisence of that wound & return to OR for repair & cholecystectomy    . COLONOSCOPY    . COLOSTOMY  1985   After colectomy for diverticulitis, pt. remarks during this surgery they "gave me the paddles two times  because of bleeding", pt. states it was unrelated to any anesthesia complication  . EYE SURGERY     /w IOL  . KNEE ARTHROSCOPY Left 2003  . KNEE SURGERY  1956   Left 1956, Right w/cartilage removed later  . PARATHYROIDECTOMY N/A 05/12/2014   Procedure: PARATHYROIDECTOMY;  Surgeon: Armandina Gemma, MD;  Location: [place];  Service: General;  Laterality: N/A;  . REVERSAL OF COLOSTOMY  1987  . TONSILLECTOMY  1946  . WRIST SURGERY Left    Remote complicated w/infection     Prescriptions Prior to Admission  Medication Sig Dispense Refill Last Dose  . amLODipine (NORVASC) 5 MG tablet Take  10 mg by mouth daily.   11/04/2015 at Unknown time  . aspirin 325 MG tablet Take 650 mg by mouth once.   11/04/2015 at 1300  . beta carotene w/minerals (OCUVITE) tablet Take 1 tablet by mouth daily.   11/04/2015 at Unknown time  . Cyanocobalamin (VITAMIN B-12) 5000 MCG TBDP Take 5,000 mcg by mouth daily.   11/04/2015 at Unknown time  . hydrochlorothiazide (HYDRODIURIL) 25 MG tablet Take 1 tablet (25 mg total) by mouth daily. 30 tablet 5 11/04/2015 at Unknown time  . losartan (COZAAR) 100 MG tablet Take 1 tablet (100 mg total) by mouth daily. 90 tablet 3 11/04/2015 at Unknown time  . naproxen sodium (ALEVE) 220 MG tablet Take 220 mg by mouth 2 (two) times daily as needed (pain).   11/03/2015 at Unknown time  . potassium chloride (K-DUR,KLOR-CON) 10 MEQ tablet Take 1-2 tablets (10-20 mEq total) by mouth daily. Take 2 pills for 2 days then once daily (Patient taking differently: Take 10 mEq by mouth daily with supper. ) 31 tablet 5 11/03/2015 at Unknown time  . amLODipine (NORVASC) 10 MG tablet Take 1 tablet (10 mg total) by mouth daily. (Patient not taking: Reported on 11/04/2015) 90 tablet 3 Not Taking at Unknown time    Physical Exam: Blood pressure (!) 163/95, pulse 66, temperature 97.9 F (36.6 C), temperature source Oral, resp. rate 12, height 6\' 1"  (1.854 m), weight 227 lb 11.8 oz (103.3 kg), SpO2 100 %. Current Weight  11/04/15 227 lb 11.8 oz (103.3 kg)  10/24/15 225 lb (102.1 kg)  09/14/15 224 lb (101.6 kg)    GENERAL:  Elderly alert WM in NAD.  HEENT:  PERRL, EOMI, sclera are clear. Oropharynx is clear. NECK:  No jugular venous distention, carotid upstroke brisk and symmetric, no bruits, no thyromegaly or adenopathy LUNGS:  Clear to auscultation bilaterally CHEST:  Unremarkable HEART:  RRR,  PMI not displaced or sustained,S1 and S2 within normal limits, no S3, no S4: no clicks, no rubs, no murmurs ABD:  Soft, nontender. BS +, no masses or bruits. No hepatomegaly, no splenomegaly. Multiple lower  abdominal surgical scars.  EXT:  2 + pulses throughout, no edema, no cyanosis no clubbing SKIN:  Warm and dry.  No rashes NEURO:  Alert and oriented x 3. Cranial nerves II through XII intact. PSYCH:  Cognitively intact    Labs:   Lab Results  Component Value  Date   WBC 12.7 (H) 11/04/2015   HGB 14.4 11/04/2015   HCT 42.8 11/04/2015   MCV 96.4 11/04/2015   PLT 275 11/04/2015    Recent Labs Lab 11/04/15 1330  NA 136  K 3.7  CL 101  CO2 26  BUN 35*  CREATININE 1.03  CALCIUM 9.1  GLUCOSE 132*   No results found for: CKMB, CKMBINDEX, TROPONINI Lab Results  Component Value Date   CHOL 104 12/15/2014   CHOL 131 12/27/2009   CHOL 137 12/14/2008   Lab Results  Component Value Date   HDL 32.00 (L) 12/15/2014   HDL 33.70 (L) 12/27/2009   HDL 30.50 (L) 12/14/2008   Lab Results  Component Value Date   LDLCALC 59 12/15/2014   LDLCALC 83 12/27/2009   LDLCALC 91 12/14/2008   Lab Results  Component Value Date   TRIG 64.0 12/15/2014   TRIG 72.0 12/27/2009   TRIG 79.0 12/14/2008   Lab Results  Component Value Date   CHOLHDL 3 12/15/2014   CHOLHDL 4 12/27/2009   CHOLHDL 4 12/14/2008   Lab Results  Component Value Date   LDLDIRECT 68.1 12/01/2013    No results found for: PROBNP Lab Results  Component Value Date   TSH 2.48 12/15/2014   No results found for: HGBA1C  Radiology: Dg Chest Port 1 View  Result Date: 11/04/2015 CLINICAL DATA:  Chest pain EXAM: PORTABLE CHEST 1 VIEW COMPARISON:  02/12/2009 FINDINGS: 1627 hours. Lordotic positioning. The lungs are clear wiithout focal pneumonia, edema, pneumothorax or pleural effusion. Cardiopericardial silhouette is at upper limits of normal for size. The visualized bony structures of the thorax are intact. Telemetry leads overlie the chest. IMPRESSION: No active disease. Electronically Signed   By: Misty Stanley M.D.   On: 11/04/2015 17:12    EKG: NSR with ST elevation c/w acute inferolateral STEMI  ASSESSMENT AND  PLAN:  1. Acute inferolateral STEMI. Administered ASA, IV heparin, and sl NTG in ED. Will proceed with emergent cardiac cath with PCI. Initiate statin therapy. The procedure and risks were reviewed including but not limited to death, myocardial infarction, stroke, arrythmias, bleeding, transfusion, emergency surgery, dye allergy, or renal dysfunction. The patient voices understanding and is agreeable to proceed.. 2. HTN- continue losartan. Switch amlodipine to a beta blocker. 3. Multiple old abdominal surgeries.   Signed: Naheim Burgen [place], [place]  11/04/2015, 5:43 PM

## 2015-11-04 NOTE — ED Notes (Signed)
Patient had 325 mg ASA by mouth this morning.

## 2015-11-04 NOTE — Progress Notes (Signed)
CRITICAL VALUE ALERT  Critical value received:  Troponin >65  Date of notification:  11/04/2015  Time of notification:  [id number]  Critical value read back:Yes.    Nurse who received alert:  L.Lynnae Sandhoff, RN  MD notified (1st page):  None, expected results

## 2015-11-05 ENCOUNTER — Telehealth: Payer: Self-pay | Admitting: Family Medicine

## 2015-11-05 ENCOUNTER — Inpatient Hospital Stay (HOSPITAL_COMMUNITY): Payer: Medicare Other

## 2015-11-05 ENCOUNTER — Encounter (HOSPITAL_COMMUNITY): Payer: Self-pay | Admitting: Cardiology

## 2015-11-05 DIAGNOSIS — I255 Ischemic cardiomyopathy: Secondary | ICD-10-CM

## 2015-11-05 LAB — TROPONIN I
Troponin I: >65 ng/mL (ref <0.03)
Troponin I: 61.84 ng/mL
Troponin I: 49.9 ng/mL

## 2015-11-05 LAB — POCT ACTIVATED CLOTTING TIME
Activated Clotting Time: 257 s
Activated Clotting Time: 274 s

## 2015-11-05 LAB — CBC
HCT: 41.4 % (ref 39.0–52.0)
HEMOGLOBIN: 13.5 g/dL (ref 13.0–17.0)
MCH: 32 pg (ref 26.0–34.0)
MCHC: 32.6 g/dL (ref 30.0–36.0)
MCV: 98.1 fL (ref 78.0–100.0)
Platelets: 248 10*3/uL (ref 150–400)
RBC: 4.22 MIL/uL (ref 4.22–5.81)
RDW: 15 % (ref 11.5–15.5)
WBC: 11.7 10*3/uL — ABNORMAL HIGH (ref 4.0–10.5)

## 2015-11-05 LAB — BASIC METABOLIC PANEL
Anion gap: 9 (ref 5–15)
BUN: 23 mg/dL — ABNORMAL HIGH (ref 6–20)
CHLORIDE: 103 mmol/L (ref 101–111)
CO2: 27 mmol/L (ref 22–32)
CREATININE: 0.94 mg/dL (ref 0.61–1.24)
Calcium: 8.6 mg/dL — ABNORMAL LOW (ref 8.9–10.3)
GFR calc Af Amer: >60 mL/min (ref >60)
GFR calc non Af Amer: >60 mL/min (ref >60)
GLUCOSE: 108 mg/dL — AB (ref 65–99)
POTASSIUM: 3.4 mmol/L — AB (ref 3.5–5.1)
SODIUM: 139 mmol/L (ref 135–145)

## 2015-11-05 LAB — LIPID PANEL
CHOL/HDL RATIO: 3.4 ratio
Cholesterol: 98 mg/dL (ref 0–200)
HDL: 29 mg/dL — AB (ref >40)
LDL CALC: 51 mg/dL (ref 0–99)
Triglycerides: 88 mg/dL (ref <150)
VLDL: 18 mg/dL (ref 0–40)

## 2015-11-05 LAB — ECHOCARDIOGRAM COMPLETE
HEIGHTINCHES: 73 in
WEIGHTICAEL: 3499.14 [oz_av]

## 2015-11-05 MED ORDER — SPIRONOLACTONE 25 MG PO TABS
25.0000 mg | ORAL_TABLET | Freq: Every day | ORAL | Status: DC
  Administered 2015-11-05 – 2015-11-08 (×3): 25 mg via ORAL
  Filled 2015-11-05 (×3): qty 1

## 2015-11-05 MED ORDER — POTASSIUM CHLORIDE CRYS ER 20 MEQ PO TBCR
20.0000 meq | EXTENDED_RELEASE_TABLET | Freq: Once | ORAL | Status: AC
  Administered 2015-11-05: 20 meq via ORAL
  Filled 2015-11-05: qty 1

## 2015-11-05 NOTE — Telephone Encounter (Signed)
LVM thanking them for update

## 2015-11-05 NOTE — Progress Notes (Signed)
CARDIAC REHAB PHASE I   PRE:  Rate/Rhythm: 59 SB    BP: sitting 122/77    SaO2: 96 RA  MODE:  Ambulation: 350 ft   POST:  Rate/Rhythm: 71 SR with PVC    BP: sitting 122/74     SaO2: 97 RA  Pt stiff and unsteady upon getting out of bed. Sts his knees tend to lock up. More limber with distance, min assist. No c/o, enjoyed walking. To recliner and began ed with pt and family. Voiced understanding regarding MI, stent, Brilinta. Will f/u for more ed. 1122-1210   [place], ACSM 11/05/2015 12:11 PM

## 2015-11-05 NOTE — Progress Notes (Signed)
  Echocardiogram 2D Echocardiogram has been performed.  Jose Ware M 11/05/2015, 9:54 AM

## 2015-11-05 NOTE — Telephone Encounter (Signed)
Wife called to advise pt is in the hospital, had a heart attack.  Pt had a stint put in   will have 2 more later this week.

## 2015-11-05 NOTE — Care Management Note (Signed)
Case Management Note  Patient Details  Name: Jose Ware MRN: [id number] Date of Birth: 04-Dec-1934  Subjective/Objective:      Adm w mi              Action/Plan: lives w wife, pcp dr Retail banker   Expected Discharge Date:                  Expected Discharge Plan:  Home/Self Care  In-House Referral:     Discharge planning Services  CM Consult, Medication Assistance  Post Acute Care Choice:    Choice offered to:     DME Arranged:    DME Agency:     HH Arranged:    HH Agency:     Status of Service:     If discussed at [employer], dates discussed:    Additional Comments: gave pt 30 day free brilinta card. [facility] medicare ins.  Lacretia Leigh, RN 11/05/2015, 9:41 AM

## 2015-11-05 NOTE — Progress Notes (Signed)
TELEMETRY: Reviewed telemetry pt in NSR with brief runs of NSVT 7-9 beats. : Vitals:   11/05/15 0500 11/05/15 0600 11/05/15 0700 11/05/15 0842  BP: 119/68 118/71 128/72   Pulse: (!) 58 (!) 59 (!) 58 (!) 59  Resp: 20 19 (!) 21   Temp:      TempSrc:      SpO2: 99% 97% 97%   Weight:      Height:        Intake/Output Summary (Last 24 hours) at 11/05/15 0854 Last data filed at 11/05/15 0700  Gross per 24 hour  Intake            206.1 ml  Output             2900 ml  Net          -2693.9 ml   Filed Weights   11/04/15 1316 11/04/15 1600 11/05/15 0400  Weight: 225 lb (102.1 kg) 227 lb 11.8 oz (103.3 kg) 218 lb 11.1 oz (99.2 kg)    Subjective  Feels well this am. Chest pain resolved. No dyspnea. Denies palpitations.  Marland Kitchen aspirin  81 mg Oral Daily  . atorvastatin  80 mg Oral q1800  . carvedilol  6.25 mg Oral BID WC  . enoxaparin (LOVENOX) injection  40 mg Subcutaneous Q24H  . losartan  100 mg Oral Daily  . mouth rinse  15 mL Mouth Rinse BID  . multivitamin-lutein  1 capsule Oral Daily  . pantoprazole  40 mg Oral Daily  . sodium chloride flush  3 mL Intravenous Q12H  . ticagrelor  90 mg Oral BID  . vitamin B-12  5,000 mcg Oral Daily   . tirofiban Stopped (11/05/15 0846)    LABS: Basic Metabolic Panel:  Recent Labs  11/04/15 1744 11/05/15 0404  NA 131* 139  K 3.4* 3.4*  CL 97* 103  CO2 24 27  GLUCOSE 103* 108*  BUN 28* 23*  CREATININE 0.94 0.94  CALCIUM 8.6* 8.6*  MG 1.9  --    Liver Function Tests:  Recent Labs  11/04/15 1744  AST 194*  ALT 39  ALKPHOS 67  BILITOT 1.3*  PROT 6.8  ALBUMIN 3.5   No results for input(s): LIPASE, AMYLASE in the last 72 hours. CBC:  Recent Labs  11/04/15 1330 11/04/15 1646 11/04/15 2216 11/05/15 0404  WBC 12.0* 12.7*  --  11.7*  NEUTROABS 9.6*  --   --   --   HGB 14.8 14.4  --  13.5  HCT 42.5 42.8  --  41.4  MCV 94.7 96.4  --  98.1  PLT 282 275 268 248   Cardiac Enzymes:  Recent Labs  11/04/15 1646  11/04/15 2216 11/05/15 0404  TROPONINI >65.00* >65.00* >65.00*   BNP: No results for input(s): PROBNP in the last 72 hours. D-Dimer: No results for input(s): DDIMER in the last 72 hours. Hemoglobin A1C: No results for input(s): HGBA1C in the last 72 hours. Fasting Lipid Panel:  Recent Labs  11/05/15 0404  CHOL 98  HDL 29*  LDLCALC 51  TRIG 88  CHOLHDL 3.4   Thyroid Function Tests: No results for input(s): TSH, T4TOTAL, T3FREE, THYROIDAB in the last 72 hours.  Invalid input(s): FREET3   Radiology/Studies:  Dg Chest Port 1 View  Result Date: 11/04/2015 CLINICAL DATA:  Chest pain EXAM: PORTABLE CHEST 1 VIEW COMPARISON:  02/12/2009 FINDINGS: 1627 hours. Lordotic positioning. The lungs are clear wiithout focal pneumonia, edema, pneumothorax or pleural effusion. Cardiopericardial silhouette is  at upper limits of normal for size. The visualized bony structures of the thorax are intact. Telemetry leads overlie the chest. IMPRESSION: No active disease. Electronically Signed   By: Misty Stanley M.D.   On: 11/04/2015 17:12   Ecg today shows NSR with LAD, Mild ST depression in V3-4. ST elevation resolved. I have personally reviewed and interpreted this study.  PHYSICAL EXAM General: Well developed, well nourished, in no acute distress. Head: Normocephalic, atraumatic, sclera non-icteric, oropharynx is clear Neck: Negative for carotid bruits. JVD not elevated. No adenopathy Lungs: Clear bilaterally to auscultation without wheezes, rales, or rhonchi. Breathing is unlabored. Heart: RRR S1 S2 without murmurs, rubs, or gallops.  Abdomen: Soft, non-tender, non-distended with normoactive bowel sounds. No hepatomegaly. No rebound/guarding. No obvious abdominal masses. Msk:  Strength and tone appears normal for age. Extremities: No clubbing, cyanosis or edema.  Distal pedal pulses are 2+ and equal bilaterally. Neuro: Alert and oriented X 3. Moves all extremities spontaneously. Psych:   Responds to questions appropriately with a normal affect.  ASSESSMENT AND PLAN: 1. Acute inferolateral STEMI. S/p emergent DES of a very large LCx with extensive thrombus. Significant residual disease in the proximal LAD and distal RCA. On ASA and Brilinta. Will complete IV Aggrastat this am. Plan staged PCI of the LAD and RCA tomorrow if stable. On beta blocker and statin ( even though lipids levels look good)  2. HTN currently controlled on losartan and Coreg. 3. NSVT post reperfusion. Will monitor on beta blocker. 4. Severe ischemic LV dysfunction. Appeared out of proportion to infarct although infarct was large. Will assess with Echo today. Continue losartan. On Coreg. Further titration limited by bradycardia. Will add aldactone. If EF < 35% by Echo may need to consider for a Lifevest. 5. Hypokalemia. Replete   Present on Admission: . Acute ST elevation myocardial infarction (STEMI) involving left circumflex coronary artery ([place]) . Essential hypertension   Signed, Peter [place], [place] 11/05/2015 8:54 AM

## 2015-11-06 ENCOUNTER — Encounter (HOSPITAL_COMMUNITY): Payer: Self-pay | Admitting: Cardiology

## 2015-11-06 ENCOUNTER — Inpatient Hospital Stay (HOSPITAL_COMMUNITY)
Admission: EM | Disposition: A | Discharge status: Home / Self Care | Payer: Self-pay | Source: Home / Self Care | Attending: Cardiology

## 2015-11-06 DIAGNOSIS — I10 Essential (primary) hypertension: Secondary | ICD-10-CM

## 2015-11-06 DIAGNOSIS — I25118 Atherosclerotic heart disease of native coronary artery with other forms of angina pectoris: Secondary | ICD-10-CM

## 2015-11-06 HISTORY — PX: CARDIAC CATHETERIZATION: SHX172

## 2015-11-06 LAB — BASIC METABOLIC PANEL
Anion gap: 7 (ref 5–15)
BUN: 21 mg/dL — AB (ref 6–20)
CHLORIDE: 103 mmol/L (ref 101–111)
CO2: 30 mmol/L (ref 22–32)
Calcium: 8.8 mg/dL — ABNORMAL LOW (ref 8.9–10.3)
Creatinine, Ser: 0.91 mg/dL (ref 0.61–1.24)
GFR calc Af Amer: >60 mL/min (ref >60)
GFR calc non Af Amer: >60 mL/min (ref >60)
GLUCOSE: 92 mg/dL (ref 65–99)
POTASSIUM: 3.5 mmol/L (ref 3.5–5.1)
Sodium: 140 mmol/L (ref 135–145)

## 2015-11-06 LAB — CBC
HCT: 38.9 % — ABNORMAL LOW (ref 39.0–52.0)
HEMOGLOBIN: 12.7 g/dL — AB (ref 13.0–17.0)
MCH: 32.1 pg (ref 26.0–34.0)
MCHC: 32.6 g/dL (ref 30.0–36.0)
MCV: 98.2 fL (ref 78.0–100.0)
Platelets: 232 10*3/uL (ref 150–400)
RBC: 3.96 MIL/uL — AB (ref 4.22–5.81)
RDW: 15.1 % (ref 11.5–15.5)
WBC: 10.5 10*3/uL (ref 4.0–10.5)

## 2015-11-06 LAB — HEMOGLOBIN A1C
Hgb A1c MFr Bld: 5.3 % (ref 4.8–5.6)
Mean Plasma Glucose: 105 mg/dL

## 2015-11-06 LAB — TROPONIN I
TROPONIN I: 27.76 ng/mL — AB (ref <0.03)
Troponin I: 38.26 ng/mL (ref <0.03)
Troponin I: 14.85 ng/mL (ref <0.03)
Troponin I: 23.75 ng/mL (ref <0.03)

## 2015-11-06 LAB — POCT ACTIVATED CLOTTING TIME
Activated Clotting Time: 208 seconds
Activated Clotting Time: 241 seconds
Activated Clotting Time: 274 seconds

## 2015-11-06 SURGERY — CORONARY STENT INTERVENTION

## 2015-11-06 MED ORDER — HEPARIN SODIUM (PORCINE) 1000 UNIT/ML IJ SOLN
INTRAMUSCULAR | Status: AC
  Filled 2015-11-06: qty 1

## 2015-11-06 MED ORDER — LIDOCAINE HCL (PF) 1 % IJ SOLN
INTRAMUSCULAR | Status: DC | PRN
  Administered 2015-11-06: 1 mL via INTRADERMAL

## 2015-11-06 MED ORDER — HEPARIN (PORCINE) IN NACL 2-0.9 UNIT/ML-% IJ SOLN
INTRAMUSCULAR | Status: AC
  Filled 2015-11-06: qty 1000

## 2015-11-06 MED ORDER — SODIUM CHLORIDE 0.9% FLUSH: 3.0000 mL | INTRAVENOUS | Status: DC | PRN

## 2015-11-06 MED ORDER — HEPARIN SODIUM (PORCINE) 1000 UNIT/ML IJ SOLN
INTRAMUSCULAR | Status: AC
Start: 2015-11-06 — End: 2015-11-06
  Filled 2015-11-06: qty 1

## 2015-11-06 MED ORDER — SODIUM CHLORIDE 0.9 % IV SOLN: INTRAVENOUS | Status: DC

## 2015-11-06 MED ORDER — HEPARIN SODIUM (PORCINE) 1000 UNIT/ML IJ SOLN
INTRAMUSCULAR | Status: DC | PRN
  Administered 2015-11-06: 8000 [IU] via INTRAVENOUS
  Administered 2015-11-06: 3000 [IU] via INTRAVENOUS

## 2015-11-06 MED ORDER — HEPARIN (PORCINE) IN NACL 2-0.9 UNIT/ML-% IJ SOLN
INTRAMUSCULAR | Status: DC | PRN
  Administered 2015-11-06: 1500 mL

## 2015-11-06 MED ORDER — VERAPAMIL HCL 2.5 MG/ML IV SOLN
INTRAVENOUS | Status: DC | PRN
  Administered 2015-11-06: 10 mL via INTRA_ARTERIAL

## 2015-11-06 MED ORDER — SODIUM CHLORIDE 0.9 % IV SOLN: 250.0000 mL | INTRAVENOUS | Status: DC | PRN

## 2015-11-06 MED ORDER — SODIUM CHLORIDE 0.9 % IV SOLN
INTRAVENOUS | Status: AC
  Administered 2015-11-06: 12:00:00 via INTRAVENOUS

## 2015-11-06 MED ORDER — IOPAMIDOL (ISOVUE-370) INJECTION 76%
INTRAVENOUS | Status: DC | PRN
Start: 2015-11-06 — End: 2015-11-06
  Administered 2015-11-06: 245 mL via INTRAVENOUS

## 2015-11-06 MED ORDER — VERAPAMIL HCL 2.5 MG/ML IV SOLN
INTRAVENOUS | Status: AC
  Filled 2015-11-06: qty 2

## 2015-11-06 MED ORDER — SODIUM CHLORIDE 0.9% FLUSH
3.0000 mL | Freq: Two times a day (BID) | INTRAVENOUS | Status: DC
  Administered 2015-11-06 – 2015-11-07 (×3): 3 mL via INTRAVENOUS

## 2015-11-06 MED ORDER — MIDAZOLAM HCL 2 MG/2ML IJ SOLN
INTRAMUSCULAR | Status: DC | PRN
Start: 2015-11-06 — End: 2015-11-06
  Administered 2015-11-06: 1 mg via INTRAVENOUS

## 2015-11-06 MED ORDER — NITROGLYCERIN 1 MG/10 ML FOR IR/CATH LAB
INTRA_ARTERIAL | Status: AC
  Filled 2015-11-06: qty 10

## 2015-11-06 MED ORDER — FENTANYL CITRATE (PF) 100 MCG/2ML IJ SOLN
INTRAMUSCULAR | Status: DC | PRN
  Administered 2015-11-06: 50 ug via INTRAVENOUS

## 2015-11-06 MED ORDER — LIDOCAINE HCL (PF) 1 % IJ SOLN
INTRAMUSCULAR | Status: AC
  Filled 2015-11-06: qty 30

## 2015-11-06 MED ORDER — SODIUM CHLORIDE 0.9 % IV SOLN
INTRAVENOUS | Status: DC
Start: 2015-11-07 — End: 2015-11-06
  Administered 2015-11-06: 04:00:00 via INTRAVENOUS

## 2015-11-06 MED ORDER — PROSIGHT PO TABS
1.0000 | ORAL_TABLET | Freq: Every day | ORAL | Status: DC
  Administered 2015-11-07 – 2015-11-08 (×2): 1 via ORAL
  Filled 2015-11-06 (×3): qty 1

## 2015-11-06 MED ORDER — MIDAZOLAM HCL 2 MG/2ML IJ SOLN
INTRAMUSCULAR | Status: AC
  Filled 2015-11-06: qty 2

## 2015-11-06 MED ORDER — SODIUM CHLORIDE 0.9% FLUSH
3.0000 mL | Freq: Two times a day (BID) | INTRAVENOUS | Status: DC
  Administered 2015-11-06: 3 mL via INTRAVENOUS

## 2015-11-06 MED ORDER — FENTANYL CITRATE (PF) 100 MCG/2ML IJ SOLN
INTRAMUSCULAR | Status: AC
  Filled 2015-11-06: qty 2

## 2015-11-06 SURGICAL SUPPLY — 20 items
BALLN ANGIOSCULPT RX 3.0X10 (BALLOONS) ×3
BALLN EMERGE MR 2.5X12 (BALLOONS) ×3
BALLN ~~LOC~~ EMERGE MR 4.0X12 (BALLOONS) ×3
BALLOON ANGIOSCULPT RX 3.0X10 (BALLOONS) IMPLANT
BALLOON EMERGE MR 2.5X12 (BALLOONS) IMPLANT
BALLOON ~~LOC~~ EMERGE MR 4.0X12 (BALLOONS) IMPLANT
CATH VISTA GUIDE 6FR XB3.5 SH (CATHETERS) ×2 IMPLANT
DEVICE RAD COMP TR BAND LRG (VASCULAR PRODUCTS) ×2 IMPLANT
GLIDESHEATH SLEND A-KIT 6F 22G (SHEATH) ×2 IMPLANT
GUIDE CATH RUNWAY 6FR FR4 SH (CATHETERS) ×2 IMPLANT
KIT ENCORE 26 ADVANTAGE (KITS) ×3 IMPLANT
KIT HEART LEFT (KITS) ×3 IMPLANT
PACK CARDIAC CATHETERIZATION (CUSTOM PROCEDURE TRAY) ×3 IMPLANT
STENT PROMUS PREM MR 3.5X16 (Permanent Stent) ×2 IMPLANT
STENT SYNERGY DES 2.5X16 (Permanent Stent) ×2 IMPLANT
TRANSDUCER W/STOPCOCK (MISCELLANEOUS) ×3 IMPLANT
TUBING CIL FLEX 10 FLL-RA (TUBING) ×3 IMPLANT
WIRE ASAHI PROWATER 180CM (WIRE) ×4 IMPLANT
WIRE HI TORQ VERSACORE-J 145CM (WIRE) ×2 IMPLANT
WIRE SAFE-T 1.5MM-J .035X260CM (WIRE) ×2 IMPLANT

## 2015-11-06 NOTE — Progress Notes (Signed)
PT Cancellation Note  Patient Details Name: Jose Ware MRN: [id number] DOB: 1934/09/15   Cancelled Treatment:    Reason Eval/Treat Not Completed: Patient not medically ready. RN reports remains on bedrest. Should be done removing wrist band and allowed OOB in ~ 1 hour. RN noted hopeful to discharge tomorrow.   Milt Coye 11/06/2015, 3:10 PM  Pager [phone]

## 2015-11-06 NOTE — Evaluation (Signed)
Physical Therapy Evaluation and Discharge Patient Details Name: Jose Ware MRN: [id number] DOB: 06/24/1934 Today's Date: 11/06/2015   History of Present Illness  Adm with acute inferolateral STEMI. S/p emergent DES 8/28 of LCx with extensive thrombus. NSVT post reperfusion. Severe ischemic LV dysfunction. 8/29 staged PCI of the LAD and RCA  PMHx- HTN, prostate Ca, DJD  Clinical Impression  Patient evaluated by Physical Therapy with no further acute PT needs identified. Patient with stiff/shuffling gait initially that improved after ~30 ft (although not to "normal"). Patient denies falls and is undergoing medical workup related to tremors and feeling "knees suddenly go weak." Awaiting results of MRI. All education has been completed re: potential need for OPPT once workup further along. Pt eager to complete Phase II Cardiac Rehab. PT is signing off. Thank you for this referral.     Follow Up Recommendations No PT follow up;Supervision for mobility/OOB (discussed possible role for OPPT once medical workup complet)    Equipment Recommendations  None recommended by PT    Recommendations for Other Services       Precautions / Restrictions Precautions Precautions: Other (comment) (s/p radial cardiac cath (x2)) Restrictions Weight Bearing Restrictions: Yes RUE Weight Bearing: Non weight bearing Other Position/Activity Restrictions: x 24 hrs      Mobility  Bed Mobility Overal bed mobility: Modified Independent             General bed mobility comments: exit to left with no use of RUE  Transfers Overall transfer level: Independent Equipment used: None                Ambulation/Gait Ambulation/Gait assistance: Min guard Ambulation Distance (Feet): 200 Feet Assistive device: None Gait Pattern/deviations: Step-through pattern;Decreased stride length;Shuffle;Wide base of support;Antalgic   Gait velocity interpretation: Below normal speed for age/gender General  Gait Details: nearly Parkinsons-like shuffle x 30 ft; reports due to his knees; becomes more fluid however remains stiff  Stairs            Wheelchair Mobility    Modified Rankin (Stroke Patients Only)       Balance Overall balance assessment: Needs assistance         Standing balance support: No upper extremity supported Standing balance-Leahy Scale: Good Standing balance comment: max challenges requires minguard due to unsteadiness (no overt LOB)                             Pertinent Vitals/Pain Pain Assessment: No/denies pain    Home Living Family/patient expects to be discharged to:: Private residence Living Arrangements: Spouse/significant other Available Help at Discharge: Family;Available 24 hours/day Type of Home: House Home Access: Stairs to enter Entrance Stairs-Rails: None Entrance Stairs-Number of Steps: 1 Home Layout: One level Home Equipment:  (walking stick/staff)      Prior Function Level of Independence: Independent         Comments: reports undergoing workup for new onset tremors and bil knee buckling if he tries to stand still for any period of time. Had MRI but not yet gotten results from MD     Hand Dominance   Dominant Hand: Right    Extremity/Trunk Assessment   Upper Extremity Assessment: Overall WFL for tasks assessed (did not use RUE; kept hand across chest for elevation )           Lower Extremity Assessment: RLE deficits/detail;LLE deficits/detail (bil knee pain/stiffness)      Cervical / Trunk Assessment: Normal  Communication   Communication: No difficulties  Cognition Arousal/Alertness: Awake/alert Behavior During Therapy: WFL for tasks assessed/performed Overall Cognitive Status: Within Functional Limits for tasks assessed                      General Comments General comments (skin integrity, edema, etc.): Wife present and discussed symptoms and workup he has undergone.     Exercises  Other Exercises Other Exercises: Educated to do midrange knee flex/ext (seated or supine) prior to initiating standing and gait to help with decr stiffness      Assessment/Plan    PT Assessment Patent does not need any further PT services (pt undergoing workup for gait issues; will have cardiac reha)  PT Diagnosis Difficulty walking   PT Problem List    PT Treatment Interventions     PT Goals (Current goals can be found in the Care Plan section) Acute Rehab PT Goals PT Goal Formulation: All assessment and education complete, DC therapy    Frequency     Barriers to discharge        Co-evaluation               End of Session Equipment Utilized During Treatment: Gait belt Activity Tolerance: Patient tolerated treatment well Patient left: in chair;with call bell/phone within reach;with family/visitor present;Other (comment) (RUE elevated on pillow) Nurse Communication: Mobility status         Time: 1641-1700 PT Time Calculation (min) (ACUTE ONLY): 19 min   Charges:   PT Evaluation $PT Eval Low Complexity: 1 Procedure     PT G Codes:        Jose Ware 11/30/2015, 5:37 PM  Pager [phone]

## 2015-11-06 NOTE — ED Provider Notes (Signed)
[place] DEPT Provider Note   CSN: [id number] Arrival date & time: 11/04/15  1311     History   Chief Complaint Chief Complaint  Patient presents with  . Chest Pain    HPI HAAKON SOCARRAS is a 81 y.o. male.  He presents with a chief complaint of arm and neck pain. He awakened this morning feeling well. He ate breakfast or brunch with his family. He is complaining of pain behind his jaw and sides of his neck. Denies pain in the sides of both arms. He became nauseated. Had some anterior chest pain and presents here. Has history of hypertension for which he takes amlodipine. Previous smoker quit in 1980. History of multiple abdominal surgeries. History of prostate cancer status post XRT, and radiation seed implantation. No history of known heart disease.  HPI  Past Medical History:  Diagnosis Date  . Adenocarcinoma of prostate [facility])    XRT & Radiation seed implantation in 2010  . Adenomatous colon polyp   . Diverticulitis   . DIVERTICULITIS, HX OF 11/27/2006       . DJD (degenerative joint disease)    wrist - R   . Hernia   . History of blood transfusion 1985   with colon surgery  . Hypertension   . Nephrolithiasis   . NEPHROLITHIASIS, HX OF 11/27/2006        . Peripheral neuropathy ([place])   . Psoriasis   . Strep throat   . Tenosynovitis     Patient Active Problem List   Diagnosis Date Noted  . Coronary artery disease involving native coronary artery   . Acute ST elevation myocardial infarction (STEMI) involving left circumflex coronary artery ([place]) 11/04/2015  . History of adenomatous polyp of colon 01/18/2015  . Primary hyperparathyroidism ([place]) 12/28/2013  . History of stomach cancer 12/01/2013  . Fatigue 12/01/2013  . Lower back pain 03/24/2013  . Bradycardia 09/23/2012  . Bruising 09/23/2012  . B12 deficiency 09/17/2010  . ADENOCARCINOMA, PROSTATE 12/14/2008  . COLONIC POLYPS, HX OF 12/14/2008  . PERIPHERAL NEUROPATHY 10/18/2007  . PSORIASIS  10/15/2007  . Essential hypertension 11/27/2006  . DEGENERATIVE JOINT DISEASE 11/27/2006  . NEPHROLITHIASIS, HX OF 11/27/2006    Past Surgical History:  Procedure Laterality Date  . APPENDECTOMY  1985  . BACK SURGERY  1980   minor disk surgery: instrumentation placed and removed in a second procedure  . CARDIAC CATHETERIZATION N/A 11/04/2015   Procedure: Left Heart Cath and Coronary Angiography;  Surgeon: Peter M Martinique, MD;  Location: [place] [facility] LAB;  Service: Cardiovascular;  Laterality: N/A;  . CARDIAC CATHETERIZATION N/A 11/04/2015   Procedure: Coronary Stent Intervention;  Surgeon: Peter M Martinique, MD;  Location: [place] CV LAB;  Service: Cardiovascular;  Laterality: N/A;  . CARDIAC CATHETERIZATION N/A 11/06/2015   Procedure: Coronary Stent Intervention;  Surgeon: Leonie Man, MD;  Location: [place] CV LAB;  Service: Cardiovascular;  Laterality: N/A;  . CATARACT EXTRACTION, BILATERAL  2013  . CHOLECYSTECTOMY  1986  . COLON SURGERY  1980's   pt. had ileus, had colon surgery, requiring colostomy & then reversal & then dehisence of that wound & return to OR for repair & cholecystectomy    . COLONOSCOPY    . COLOSTOMY  1985   After colectomy for diverticulitis, pt. remarks during this surgery they "gave me the paddles two times  because of bleeding", pt. states it was unrelated to any anesthesia complication  . EYE SURGERY     /w IOL  .  KNEE ARTHROSCOPY Left 2003  . KNEE SURGERY  1956   Left 1956, Right w/cartilage removed later  . PARATHYROIDECTOMY N/A 05/12/2014   Procedure: PARATHYROIDECTOMY;  Surgeon: Armandina Gemma, MD;  Location: [place];  Service: General;  Laterality: N/A;  . REVERSAL OF COLOSTOMY  1987  . TONSILLECTOMY  1946  . WRIST SURGERY Left    Remote complicated w/infection       Home Medications    Prior to Admission medications   Medication Sig Start Date End Date Taking? Authorizing Provider  amLODipine (NORVASC) 5 MG tablet Take 10 mg by mouth  daily.   Yes Historical Provider, MD  aspirin 325 MG tablet Take 650 mg by mouth once.   Yes Historical Provider, MD  beta carotene w/minerals (OCUVITE) tablet Take 1 tablet by mouth daily.   Yes Historical Provider, MD  Cyanocobalamin (VITAMIN B-12) 5000 MCG TBDP Take 5,000 mcg by mouth daily.   Yes Historical Provider, MD  hydrochlorothiazide (HYDRODIURIL) 25 MG tablet Take 1 tablet (25 mg total) by mouth daily. 09/14/15  Yes [name], MD  losartan (COZAAR) 100 MG tablet Take 1 tablet (100 mg total) by mouth daily. 02/21/15  Yes [name], MD  naproxen sodium (ALEVE) 220 MG tablet Take 220 mg by mouth 2 (two) times daily as needed (pain).   Yes Historical Provider, MD  potassium chloride (K-DUR,KLOR-CON) 10 MEQ tablet Take 1-2 tablets (10-20 mEq total) by mouth daily. Take 2 pills for 2 days then once daily Patient taking differently: Take 10 mEq by mouth daily with supper.  10/25/15  Yes [name], MD  amLODipine (NORVASC) 10 MG tablet Take 1 tablet (10 mg total) by mouth daily. Patient not taking: Reported on 11/04/2015 10/24/15   [name], MD    Family History Family History  Problem Relation Age of Onset  . Coronary artery disease Mother   . Alcohol abuse Father   . Cirrhosis Father   . Heart failure Sister   . Breast cancer Sister     W/involvement of right arm leading to amputation  . Coronary artery disease Brother   . Heart attack Brother   . Clotting disorder Brother   . Prostate cancer Neg Hx   . Colon cancer Neg Hx   . Diabetes Neg Hx   . Glaucoma Neg Hx   . Rectal cancer Neg Hx   . Esophageal cancer Neg Hx     Social History Social History  Substance Use Topics  . Smoking status: Former Smoker    Quit date: 03/11/1983  . Smokeless tobacco: Never Used  . Alcohol use 0.0 oz/week     Comment: RARE     Allergies   Bee venom and Betadine [povidone iodine]   Review of Systems Review of Systems  Constitutional: Positive for diaphoresis.  Negative for appetite change, chills, fatigue and fever.  HENT: Negative for mouth sores, sore throat and trouble swallowing.        Bilateral posterior jaw pain  Eyes: Negative for visual disturbance.  Respiratory: Negative for cough, chest tightness, shortness of breath and wheezing.   Cardiovascular: Positive for chest pain.  Gastrointestinal: Positive for nausea. Negative for abdominal distention, abdominal pain, diarrhea and vomiting.  Endocrine: Negative for polydipsia, polyphagia and polyuria.  Genitourinary: Negative for dysuria, frequency and hematuria.  Musculoskeletal: Negative for gait problem.       Bilateral arm pain.  Skin: Negative for color change, pallor and rash.  Neurological: Negative for dizziness, syncope,  light-headedness and headaches.  Hematological: Does not bruise/bleed easily.  Psychiatric/Behavioral: Negative for behavioral problems and confusion.     Physical Exam Updated Vital Signs BP 129/74   Pulse (!) 56   Temp 97.5 F (36.4 C) (Oral)   Resp (!) 0   Ht 6\' 1"  (1.854 m)   Wt 219 lb 12.8 oz (99.7 kg)   SpO2 99%   BMI 29.00 kg/m   Physical Exam  Constitutional: He is oriented to person, place, and time. He appears well-developed and well-nourished. No distress.  HENT:  Head: Normocephalic.  Eyes: Conjunctivae are normal. Pupils are equal, round, and reactive to light. No scleral icterus.  Neck: Normal range of motion. Neck supple. No thyromegaly present.  Cardiovascular: Normal rate and regular rhythm.  Exam reveals no gallop and no friction rub.   No murmur heard. Pulmonary/Chest: Effort normal and breath sounds normal. No respiratory distress. He has no wheezes. He has no rales.  Abdominal: Soft. Bowel sounds are normal. He exhibits no distension. There is no tenderness. There is no rebound.  Musculoskeletal: Normal range of motion.  Neurological: He is alert and oriented to person, place, and time.  Skin: Skin is warm and dry. No rash  noted.  Psychiatric: He has a normal mood and affect. His behavior is normal.     ED Treatments / Results  Labs (all labs ordered are listed, but only abnormal results are displayed) Labs Reviewed  CBC WITH DIFFERENTIAL/PLATELET - Abnormal; Notable for the following:       Result Value   WBC 12.0 (*)    Neutro Abs 9.6 (*)    All other components within normal limits  BASIC METABOLIC PANEL - Abnormal; Notable for the following:    Glucose, Bld 132 (*)    BUN 35 (*)    All other components within normal limits  CBC - Abnormal; Notable for the following:    WBC 12.7 (*)    All other components within normal limits  TROPONIN I - Abnormal; Notable for the following:    Troponin I >65.00 (*)    All other components within normal limits  TROPONIN I - Abnormal; Notable for the following:    Troponin I >65.00 (*)    All other components within normal limits  TROPONIN I - Abnormal; Notable for the following:    Troponin I >65.00 (*)    All other components within normal limits  CBC - Abnormal; Notable for the following:    WBC 11.7 (*)    All other components within normal limits  BASIC METABOLIC PANEL - Abnormal; Notable for the following:    Potassium 3.4 (*)    Glucose, Bld 108 (*)    BUN 23 (*)    Calcium 8.6 (*)    All other components within normal limits  COMPREHENSIVE METABOLIC PANEL - Abnormal; Notable for the following:    Sodium 131 (*)    Potassium 3.4 (*)    Chloride 97 (*)    Glucose, Bld 103 (*)    BUN 28 (*)    Calcium 8.6 (*)    AST 194 (*)    Total Bilirubin 1.3 (*)    All other components within normal limits  LIPID PANEL - Abnormal; Notable for the following:    HDL 29 (*)    All other components within normal limits  TROPONIN I - Abnormal; Notable for the following:    Troponin I >65.00 (*)    All other components within normal  limits  TROPONIN I - Abnormal; Notable for the following:    Troponin I 61.84 (*)    All other components within normal  limits  TROPONIN I - Abnormal; Notable for the following:    Troponin I 49.90 (*)    All other components within normal limits  TROPONIN I - Abnormal; Notable for the following:    Troponin I 38.26 (*)    All other components within normal limits  CBC - Abnormal; Notable for the following:    RBC 3.96 (*)    Hemoglobin 12.7 (*)    HCT 38.9 (*)    All other components within normal limits  BASIC METABOLIC PANEL - Abnormal; Notable for the following:    BUN 21 (*)    Calcium 8.8 (*)    All other components within normal limits  TROPONIN I - Abnormal; Notable for the following:    Troponin I 27.76 (*)    All other components within normal limits  TROPONIN I - Abnormal; Notable for the following:    Troponin I 14.85 (*)    All other components within normal limits  I-STAT TROPOININ, ED - Abnormal; Notable for the following:    Troponin i, poc 0.44 (*)    All other components within normal limits  MRSA PCR SCREENING  PROTIME-INR  CREATININE, SERUM  MAGNESIUM  HEMOGLOBIN A1C  PLATELET COUNT  TROPONIN I  TROPONIN I  CBC  BASIC METABOLIC PANEL  POCT ACTIVATED CLOTTING TIME  POCT ACTIVATED CLOTTING TIME    EKG  EKG Interpretation  Date/Time:  Sunday November 04 2015 13:21:46 EDT Ventricular Rate:  77 PR Interval:    QRS Duration: 102 QT Interval:  395 QTC Calculation: 447 R Axis:   26 Text Interpretation:  Sinus rhythm Ventricular trigeminy Short PR interval Probable left atrial enlargement Inferoposterior infarct, acute (RCA) Lateral infarct, acute Probable RV involvement, suggest recording right precordial leads ** ** ACUTE MI / STEMI ** ** Confirmed by KNOTT MD, DANIEL [id number]) on 11/05/2015 10:50:46 AM       Radiology No results found.  Procedures Procedures (including critical care time)  Medications Ordered in ED Medications  losartan (COZAAR) tablet 100 mg ( Oral MAR Unhold 11/06/15 1144)  vitamin B-12 (CYANOCOBALAMIN) tablet 5,000 mcg ( Oral MAR Unhold 11/06/15  1144)  sodium chloride flush (NS) 0.9 % injection 3 mL ( Intravenous MAR Unhold 11/06/15 1144)  sodium chloride flush (NS) 0.9 % injection 3 mL ( Intravenous MAR Unhold 11/06/15 1144)  0.9 %  sodium chloride infusion ( Intravenous MAR Unhold 11/06/15 1144)  acetaminophen (TYLENOL) tablet 650 mg ( Oral MAR Unhold 11/06/15 1144)  ondansetron (ZOFRAN) injection 4 mg ( Intravenous MAR Unhold 11/06/15 1144)  enoxaparin (LOVENOX) injection 40 mg ( Subcutaneous MAR Unhold 11/06/15 1144)  0.9% sodium chloride infusion (0 mL/kg/hr  102.1 kg Intravenous Duplicate [id number])  atorvastatin (LIPITOR) tablet 80 mg ( Oral MAR Unhold 11/06/15 1144)  aspirin chewable tablet 81 mg ( Oral MAR Unhold 11/06/15 1144)  ticagrelor (BRILINTA) tablet 90 mg ( Oral MAR Unhold 11/06/15 1144)  nitroGLYCERIN (NITROSTAT) SL tablet 0.4 mg ( Sublingual MAR Unhold 11/06/15 1144)  carvedilol (COREG) tablet 6.25 mg ( Oral MAR Unhold 11/06/15 1144)  tirofiban (AGGRASTAT) infusion 50 mcg/mL 100 mL (0 mcg/kg/min  103.3 kg Intravenous Stopped 11/05/15 0846)  pantoprazole (PROTONIX) EC tablet 40 mg ( Oral MAR Unhold 11/06/15 1144)  MEDLINE mouth rinse ( Mouth Rinse MAR Unhold 11/06/15 1144)  spironolactone (ALDACTONE) tablet 25 mg ( Oral  MAR Unhold 11/06/15 1144)  multivitamin (PROSIGHT) tablet 1 tablet ( Oral MAR Unhold 11/06/15 1144)  sodium chloride flush (NS) 0.9 % injection 3 mL (0 mLs Intravenous Duplicate [id number])  sodium chloride flush (NS) 0.9 % injection 3 mL (not administered)  0.9 %  sodium chloride infusion (not administered)  0.9 %  sodium chloride infusion ( Intravenous Stopped 11/06/15 1600)  heparin injection 4,000 Units (4,000 Units Intravenous Given 11/04/15 1329)  nitroGLYCERIN 50 mg in dextrose 5 % 250 mL (0.2 mg/mL) infusion (0 mcg/min Intravenous Stopped 11/04/15 1431)  potassium chloride SA (K-DUR,KLOR-CON) CR tablet 20 mEq (20 mEq Oral Given 11/05/15 1019)     Initial Impression / Assessment and Plan / ED Course    I have reviewed the triage vital signs and the nursing notes.  Pertinent labs & imaging results that were available during my care of the patient were reviewed by me and considered in my medical decision making (see chart for details).  Clinical Course    I was shown this patient's EKG while he was still in a triage room. When immediately to the room. Asked that he be placed in a wheelchair and taken directly to the resuscitation room. I accompanied him there. Repeat EKG supine confirms 2 mm of ST elevation inferiorly and laterally. Code STEMI was activated. Patient given heparin bolus and started on infusion. Also given nitroglycerin infusion. Patient given aspirin prior to arrival, to 325 mg aspirin given by wife. She has the bottle is able to confirm.  Dr. Martinique return my call immediately. [place] for transfer to [facility]. Dr. Martinique is always return my call immediately and immediately except the patient in transfer.  Final Clinical Impressions(s) / ED Diagnoses   Final diagnoses:  ST elevation myocardial infarction involving right coronary artery [facility])    New Prescriptions Current Discharge Medication List     CRITICAL CAR Performed by: Tanna Furry JOSEPH   Total critical care time: 25 minutes  Critical care time was exclusive of separately billable procedures and treating other patients.  Critical care was necessary to treat or prevent imminent or life-threatening deterioration.  Critical care was time spent personally by me on the following activities: development of treatment plan with patient and/or surrogate as well as nursing, discussions with consultants, evaluation of patient's response to treatment, examination of patient, obtaining history from patient or surrogate, ordering and performing treatments and interventions, ordering and review of laboratory studies, ordering and review of radiographic studies, pulse oximetry and re-evaluation of patient's  condition. c   Tanna Furry, MD 11/06/15 1714

## 2015-11-06 NOTE — Progress Notes (Signed)
TELEMETRY: Reviewed telemetry pt in NSR. No VT. Vitals:   11/06/15 0400 11/06/15 0500 11/06/15 0600 11/06/15 0700  BP: 132/76 119/72 123/80 126/76  Pulse: 60 (!) 59 60 60  Resp:      Temp:      TempSrc:      SpO2: 97% 95% 97% 97%  Weight:      Height:        Intake/Output Summary (Last 24 hours) at 11/06/15 0759 Last data filed at 11/06/15 0700  Gross per 24 hour  Intake              870 ml  Output             1250 ml  Net             -380 ml   Filed Weights   11/04/15 1600 11/05/15 0400 11/06/15 0300  Weight: 227 lb 11.8 oz (103.3 kg) 218 lb 11.1 oz (99.2 kg) 219 lb 12.8 oz (99.7 kg)    Subjective  Feels well this am. Chest pain resolved. No dyspnea.   Marland Kitchen aspirin  81 mg Oral Daily  . atorvastatin  80 mg Oral q1800  . carvedilol  6.25 mg Oral BID WC  . enoxaparin (LOVENOX) injection  40 mg Subcutaneous Q24H  . losartan  100 mg Oral Daily  . mouth rinse  15 mL Mouth Rinse BID  . multivitamin  1 tablet Oral Daily  . pantoprazole  40 mg Oral Daily  . sodium chloride flush  3 mL Intravenous Q12H  . sodium chloride flush  3 mL Intravenous Q12H  . spironolactone  25 mg Oral Daily  . ticagrelor  90 mg Oral BID  . vitamin B-12  5,000 mcg Oral Daily   . sodium chloride 10 mL/hr (11/06/15 0538)    LABS: Basic Metabolic Panel:  Recent Labs  11/04/15 1744 11/05/15 0404 11/06/15 0348  NA 131* 139 140  K 3.4* 3.4* 3.5  CL 97* 103 103  CO2 24 27 30   GLUCOSE 103* 108* 92  BUN 28* 23* 21*  CREATININE 0.94 0.94 0.91  CALCIUM 8.6* 8.6* 8.8*  MG 1.9  --   --    Liver Function Tests:  Recent Labs  11/04/15 1744  AST 194*  ALT 39  ALKPHOS 67  BILITOT 1.3*  PROT 6.8  ALBUMIN 3.5   No results for input(s): LIPASE, AMYLASE in the last 72 hours. CBC:  Recent Labs  11/04/15 1330  11/05/15 0404 11/06/15 0348  WBC 12.0*  < > 11.7* 10.5  NEUTROABS 9.6*  --   --   --   HGB 14.8  < > 13.5 12.7*  HCT 42.5  < > 41.4 38.9*  MCV 94.7  < > 98.1 98.2  PLT 282  < >  248 232  < > = values in this interval not displayed. Cardiac Enzymes:  Recent Labs  11/05/15 1658 11/05/15 2119 11/06/15 0348  TROPONINI 61.84* 49.90* 38.26*   BNP: No results for input(s): PROBNP in the last 72 hours. D-Dimer: No results for input(s): DDIMER in the last 72 hours. Hemoglobin A1C:  Recent Labs  11/04/15 1745  HGBA1C 5.3   Fasting Lipid Panel:  Recent Labs  11/05/15 0404  CHOL 98  HDL 29*  LDLCALC 51  TRIG 88  CHOLHDL 3.4   Thyroid Function Tests: No results for input(s): TSH, T4TOTAL, T3FREE, THYROIDAB in the last 72 hours.  Invalid input(s): FREET3   Radiology/Studies:  [facility] [employer] [facility]  1 View  Result Date: 11/04/2015 CLINICAL DATA:  Chest pain EXAM: PORTABLE CHEST 1 VIEW COMPARISON:  02/12/2009 FINDINGS: 1627 hours. Lordotic positioning. The lungs are clear wiithout focal pneumonia, edema, pneumothorax or pleural effusion. Cardiopericardial silhouette is at upper limits of normal for size. The visualized bony structures of the thorax are intact. Telemetry leads overlie the chest. IMPRESSION: No active disease. Electronically Signed   By: Misty Stanley M.D.   On: 11/04/2015 17:12   Ecg today shows NSR with LAD, ST elevation resolved. Recent inferoposterior infarct.  I have personally reviewed and interpreted this study.  Echo: Study Conclusions  - Left ventricle: The cavity size was normal. Wall thickness was   increased in a pattern of moderate LVH. Systolic function was   mildly reduced. The estimated ejection fraction was in the range   of 45% to 50%. Inferolateral hypokinesis. Doppler parameters are   consistent with abnormal left ventricular relaxation (grade 1   diastolic dysfunction). The E/e&' ratio is >15, suggesting   elevated LV filling pressure. - Aortic valve: Trileaflet. Sclerosis without stenosis. - Mitral valve: Calcified annulus. There was trivial regurgitation. - Left atrium: The atrium was mildly dilated. - Inferior vena  cava: The vessel was normal in size. The   respirophasic diameter changes were in the normal range (>= 50%),   consistent with normal central venous pressure.  Impressions:  - LVEF 45-50% with moderate LVH and inferolateral hypokinesis,   diastolic dysfunction with elevated LV filling pressure, aortic   valve sclerosis, trivial MR, mild LAE, normal IVC.   PHYSICAL EXAM General: Well developed, well nourished, in no acute distress. Head: Normocephalic, atraumatic, sclera non-icteric, oropharynx is clear Neck: Negative for carotid bruits. JVD not elevated. No adenopathy Lungs: Clear bilaterally to auscultation without wheezes, rales, or rhonchi. Breathing is unlabored. Heart: RRR S1 S2 without murmurs, rubs, or gallops.  Abdomen: Soft, non-tender, non-distended with normoactive bowel sounds. No hepatomegaly. No rebound/guarding. No obvious abdominal masses. Msk:  Strength and tone appears normal for age. Extremities: No clubbing, cyanosis or edema.  Distal pedal pulses are 2+ and equal bilaterally. Neuro: Alert and oriented X 3. Moves all extremities spontaneously. Psych:  Responds to questions appropriately with a normal affect.  ASSESSMENT AND PLAN: 1. Acute inferolateral STEMI. S/p emergent DES of a very large LCx with extensive thrombus. Significant residual disease in the proximal LAD and distal RCA. On ASA and Brilinta.  Plan staged PCI of the LAD and RCA today.  On beta blocker and statin ( even though lipids levels look good). Should be able to transfer to telemetry today after PCI and DC in am if no complications. 2. HTN currently controlled on losartan and Coreg. 3. NSVT post reperfusion. Resolved. 4. Severe ischemic LV dysfunction. Appeared out of proportion to infarct although infarct was large. EF improved by Echo and now 40-45%. Does not need LifeVest. Continue Coreg, losartan and aldactone. 5. Hypokalemia. Repleted   Present on Admission: . Acute ST elevation  myocardial infarction (STEMI) involving left circumflex coronary artery ([place]) . Essential hypertension   Signed, Yaakov Saindon Martinique, [place] 11/06/2015 7:59 AM

## 2015-11-06 NOTE — Interval H&P Note (Signed)
History and Physical Interval Note:  11/06/2015 9:52 AM  Jose Ware  has presented today for surgery, with the diagnosis of cad s/p recent Inferolateral STEMI with PCI of LCx - has existing severe lesions in the dRCA/RPAVand mLAD - planned Staged PCI for complete revascularization    The various methods of treatment have been discussed with the patient and family. After consideration of risks, benefits and other options for treatment, the patient has consented to  Procedure(s): Coronary Stent Intervention (N/A) as a surgical intervention .  The patient's history has been reviewed, patient examined, no change in status, stable for surgery.  I have reviewed the patient's chart and labs.  Questions were answered to the patient's satisfaction.     Cath Lab Visit (complete for each Cath Lab visit)  Clinical Evaluation Leading to the Procedure:   ACS: No. Post STEMI   Non-ACS:    Anginal Classification: CCS II  Anti-ischemic medical therapy: Maximal Therapy (2 or more classes of medications)  Non-Invasive Test Results: No non-invasive testing performed  Prior CABG: No previous CABG  Ischemic Symptoms? CCS II (Slight limitation of ordinary activity) Anti-ischemic Medical Therapy? Maximal Medical Therapy (2 or more classes of medications) Non-invasive Test Results? No non-invasive testing performed Prior CABG? No Previous CABG   Patient Information:   1-2V CAD, no prox LAD  U (5)  Indication: 20; Score: 5   Patient Information:   1-2V-CAD with DS 50-60% With No FFR, No IVUS  I (2)  Indication: 21; Score: 2   Patient Information:   1-2V-CAD with DS 50-60% With FFR  U (6)  Indication: 22; Score: 6   Patient Information:   1-2V-CAD with DS 50-60% With FFR>0.8, IVUS not significant  I (2)  Indication: 23; Score: 2   Patient Information:   3V-CAD without LMCA With Abnormal LV systolic function  A (9)  Indication: 48; Score: 9   Patient Information:    LMCA-CAD  A (9)  Indication: 49; Score: 9   Jose Ware

## 2015-11-06 NOTE — H&P (View-Only) (Signed)
TELEMETRY: Reviewed telemetry pt in NSR. No VT. Vitals:   11/06/15 0400 11/06/15 0500 11/06/15 0600 11/06/15 0700  BP: 132/76 119/72 123/80 126/76  Pulse: 60 (!) 59 60 60  Resp:      Temp:      TempSrc:      SpO2: 97% 95% 97% 97%  Weight:      Height:        Intake/Output Summary (Last 24 hours) at 11/06/15 0759 Last data filed at 11/06/15 0700  Gross per 24 hour  Intake              870 ml  Output             1250 ml  Net             -380 ml   Filed Weights   11/04/15 1600 11/05/15 0400 11/06/15 0300  Weight: 227 lb 11.8 oz (103.3 kg) 218 lb 11.1 oz (99.2 kg) 219 lb 12.8 oz (99.7 kg)    Subjective  Feels well this am. Chest pain resolved. No dyspnea.   Marland Kitchen aspirin  81 mg Oral Daily  . atorvastatin  80 mg Oral q1800  . carvedilol  6.25 mg Oral BID WC  . enoxaparin (LOVENOX) injection  40 mg Subcutaneous Q24H  . losartan  100 mg Oral Daily  . mouth rinse  15 mL Mouth Rinse BID  . multivitamin  1 tablet Oral Daily  . pantoprazole  40 mg Oral Daily  . sodium chloride flush  3 mL Intravenous Q12H  . sodium chloride flush  3 mL Intravenous Q12H  . spironolactone  25 mg Oral Daily  . ticagrelor  90 mg Oral BID  . vitamin B-12  5,000 mcg Oral Daily   . sodium chloride 10 mL/hr (11/06/15 0538)    LABS: Basic Metabolic Panel:  Recent Labs  11/04/15 1744 11/05/15 0404 11/06/15 0348  NA 131* 139 140  K 3.4* 3.4* 3.5  CL 97* 103 103  CO2 24 27 30   GLUCOSE 103* 108* 92  BUN 28* 23* 21*  CREATININE 0.94 0.94 0.91  CALCIUM 8.6* 8.6* 8.8*  MG 1.9  --   --    Liver Function Tests:  Recent Labs  11/04/15 1744  AST 194*  ALT 39  ALKPHOS 67  BILITOT 1.3*  PROT 6.8  ALBUMIN 3.5   No results for input(s): LIPASE, AMYLASE in the last 72 hours. CBC:  Recent Labs  11/04/15 1330  11/05/15 0404 11/06/15 0348  WBC 12.0*  < > 11.7* 10.5  NEUTROABS 9.6*  --   --   --   HGB 14.8  < > 13.5 12.7*  HCT 42.5  < > 41.4 38.9*  MCV 94.7  < > 98.1 98.2  PLT 282  < >  248 232  < > = values in this interval not displayed. Cardiac Enzymes:  Recent Labs  11/05/15 1658 11/05/15 2119 11/06/15 0348  TROPONINI 61.84* 49.90* 38.26*   BNP: No results for input(s): PROBNP in the last 72 hours. D-Dimer: No results for input(s): DDIMER in the last 72 hours. Hemoglobin A1C:  Recent Labs  11/04/15 1745  HGBA1C 5.3   Fasting Lipid Panel:  Recent Labs  11/05/15 0404  CHOL 98  HDL 29*  LDLCALC 51  TRIG 88  CHOLHDL 3.4   Thyroid Function Tests: No results for input(s): TSH, T4TOTAL, T3FREE, THYROIDAB in the last 72 hours.  Invalid input(s): FREET3   Radiology/Studies:  [facility] [employer] [facility]  1 View  Result Date: 11/04/2015 CLINICAL DATA:  Chest pain EXAM: PORTABLE CHEST 1 VIEW COMPARISON:  02/12/2009 FINDINGS: 1627 hours. Lordotic positioning. The lungs are clear wiithout focal pneumonia, edema, pneumothorax or pleural effusion. Cardiopericardial silhouette is at upper limits of normal for size. The visualized bony structures of the thorax are intact. Telemetry leads overlie the chest. IMPRESSION: No active disease. Electronically Signed   By: Misty Stanley M.D.   On: 11/04/2015 17:12   Ecg today shows NSR with LAD, ST elevation resolved. Recent inferoposterior infarct.  I have personally reviewed and interpreted this study.  Echo: Study Conclusions  - Left ventricle: The cavity size was normal. Wall thickness was   increased in a pattern of moderate LVH. Systolic function was   mildly reduced. The estimated ejection fraction was in the range   of 45% to 50%. Inferolateral hypokinesis. Doppler parameters are   consistent with abnormal left ventricular relaxation (grade 1   diastolic dysfunction). The E/e&' ratio is >15, suggesting   elevated LV filling pressure. - Aortic valve: Trileaflet. Sclerosis without stenosis. - Mitral valve: Calcified annulus. There was trivial regurgitation. - Left atrium: The atrium was mildly dilated. - Inferior vena  cava: The vessel was normal in size. The   respirophasic diameter changes were in the normal range (>= 50%),   consistent with normal central venous pressure.  Impressions:  - LVEF 45-50% with moderate LVH and inferolateral hypokinesis,   diastolic dysfunction with elevated LV filling pressure, aortic   valve sclerosis, trivial MR, mild LAE, normal IVC.   PHYSICAL EXAM General: Well developed, well nourished, in no acute distress. Head: Normocephalic, atraumatic, sclera non-icteric, oropharynx is clear Neck: Negative for carotid bruits. JVD not elevated. No adenopathy Lungs: Clear bilaterally to auscultation without wheezes, rales, or rhonchi. Breathing is unlabored. Heart: RRR S1 S2 without murmurs, rubs, or gallops.  Abdomen: Soft, non-tender, non-distended with normoactive bowel sounds. No hepatomegaly. No rebound/guarding. No obvious abdominal masses. Msk:  Strength and tone appears normal for age. Extremities: No clubbing, cyanosis or edema.  Distal pedal pulses are 2+ and equal bilaterally. Neuro: Alert and oriented X 3. Moves all extremities spontaneously. Psych:  Responds to questions appropriately with a normal affect.  ASSESSMENT AND PLAN: 1. Acute inferolateral STEMI. S/p emergent DES of a very large LCx with extensive thrombus. Significant residual disease in the proximal LAD and distal RCA. On ASA and Brilinta.  Plan staged PCI of the LAD and RCA today.  On beta blocker and statin ( even though lipids levels look good). Should be able to transfer to telemetry today after PCI and DC in am if no complications. 2. HTN currently controlled on losartan and Coreg. 3. NSVT post reperfusion. Resolved. 4. Severe ischemic LV dysfunction. Appeared out of proportion to infarct although infarct was large. EF improved by Echo and now 40-45%. Does not need LifeVest. Continue Coreg, losartan and aldactone. 5. Hypokalemia. Repleted   Present on Admission: . Acute ST elevation  myocardial infarction (STEMI) involving left circumflex coronary artery ([place]) . Essential hypertension   Signed, Fergus Throne [place], [place] 11/06/2015 7:59 AM

## 2015-11-07 ENCOUNTER — Encounter (HOSPITAL_COMMUNITY): Payer: Self-pay | Admitting: Cardiology

## 2015-11-07 ENCOUNTER — Inpatient Hospital Stay (HOSPITAL_COMMUNITY): Payer: Medicare Other

## 2015-11-07 DIAGNOSIS — I69322 Dysarthria following cerebral infarction: Secondary | ICD-10-CM

## 2015-11-07 DIAGNOSIS — I639 Cerebral infarction, unspecified: Secondary | ICD-10-CM

## 2015-11-07 DIAGNOSIS — I255 Ischemic cardiomyopathy: Secondary | ICD-10-CM

## 2015-11-07 DIAGNOSIS — I5021 Acute systolic (congestive) heart failure: Secondary | ICD-10-CM

## 2015-11-07 LAB — BASIC METABOLIC PANEL
ANION GAP: 9 (ref 5–15)
BUN: 20 mg/dL (ref 6–20)
CHLORIDE: 103 mmol/L (ref 101–111)
CO2: 28 mmol/L (ref 22–32)
Calcium: 8.5 mg/dL — ABNORMAL LOW (ref 8.9–10.3)
Creatinine, Ser: 1.03 mg/dL (ref 0.61–1.24)
GFR calc Af Amer: >60 mL/min (ref >60)
GLUCOSE: 92 mg/dL (ref 65–99)
POTASSIUM: 3.4 mmol/L — AB (ref 3.5–5.1)
Sodium: 140 mmol/L (ref 135–145)

## 2015-11-07 LAB — TROPONIN I
TROPONIN I: 19.82 ng/mL — AB (ref <0.03)
Troponin I: 25.64 ng/mL (ref <0.03)

## 2015-11-07 LAB — CBC
HCT: 38.4 % — ABNORMAL LOW (ref 39.0–52.0)
HEMOGLOBIN: 12.4 g/dL — AB (ref 13.0–17.0)
MCH: 31.9 pg (ref 26.0–34.0)
MCHC: 32.3 g/dL (ref 30.0–36.0)
MCV: 98.7 fL (ref 78.0–100.0)
PLATELETS: 239 10*3/uL (ref 150–400)
RBC: 3.89 MIL/uL — AB (ref 4.22–5.81)
RDW: 15.1 % (ref 11.5–15.5)
WBC: 10.8 10*3/uL — AB (ref 4.0–10.5)

## 2015-11-07 MED ORDER — PANTOPRAZOLE SODIUM 40 MG PO TBEC: 40.0000 mg | DELAYED_RELEASE_TABLET | Freq: Every day | ORAL | 3 refills | Status: DC

## 2015-11-07 MED ORDER — CARVEDILOL 6.25 MG PO TABS: 6.2500 mg | ORAL_TABLET | Freq: Two times a day (BID) | ORAL | 3 refills | Status: DC

## 2015-11-07 MED ORDER — ATORVASTATIN CALCIUM 80 MG PO TABS: 80.0000 mg | ORAL_TABLET | Freq: Every day | ORAL | 3 refills | Status: DC

## 2015-11-07 MED ORDER — ACETAMINOPHEN 325 MG PO TABS: 650.0000 mg | ORAL_TABLET | ORAL | Status: DC | PRN

## 2015-11-07 MED ORDER — NITROGLYCERIN 0.4 MG SL SUBL: 0.4000 mg | SUBLINGUAL_TABLET | SUBLINGUAL | 2 refills | Status: DC | PRN

## 2015-11-07 MED ORDER — STROKE: EARLY STAGES OF RECOVERY BOOK
Freq: Once | Status: DC
  Filled 2015-11-07: qty 1

## 2015-11-07 MED ORDER — SPIRONOLACTONE 25 MG PO TABS: 25.0000 mg | ORAL_TABLET | Freq: Every day | ORAL | 3 refills | Status: DC

## 2015-11-07 MED ORDER — TICAGRELOR 90 MG PO TABS: 90.0000 mg | ORAL_TABLET | Freq: Two times a day (BID) | ORAL | 3 refills | Status: DC

## 2015-11-07 MED ORDER — ASPIRIN 81 MG PO CHEW: 81.0000 mg | CHEWABLE_TABLET | Freq: Every day | ORAL | Status: DC

## 2015-11-07 NOTE — Discharge Instructions (Signed)
Heart Attack A heart attack (myocardial infarction, MI) causes damage to the heart that cannot be fixed. A heart attack often happens when a blood clot or other blockage cuts blood flow to the heart. When this happens, certain areas of the heart begin to die. This causes the pain you feel during a heart attack. HOME CARE  Take medicine as told by your doctor. You may need medicine to:  Keep your blood from clotting too easily.  Control your blood pressure.  Lower your cholesterol.  Control abnormal heart rhythms.  Change certain behaviors as told by your doctor. This may include:  Quitting smoking.  Being active.  Eating a heart-healthy diet. Ask your doctor for help with this diet.  Keeping a healthy weight.  Keeping your diabetes under control.  Lessening stress.  Limiting how much alcohol you drink. Do not take these medicines unless your doctor says that you can:  Nonsteroidal anti-inflammatory drugs (NSAIDs). These include:  Ibuprofen.  Naproxen.  Celecoxib.  Vitamin supplements that have vitamin A, vitamin E, or both.  Hormone therapy that contains estrogen with or without progestin. GET HELP RIGHT AWAY IF:  You have sudden chest discomfort.  You have sudden discomfort in your:  Arms.  Back.  Neck.  Jaw.  You have shortness of breath at any time.  You have sudden sweating or clammy skin.  You feel sick to your stomach (nauseous) or throw up (vomit).  You suddenly get light-headed or dizzy.  You feel your heart beating fast or skipping beats. These symptoms may be an emergency. Do not wait to see if the symptoms will go away. Get medical help right away. Call your local emergency services (911 in the [place] Do not drive yourself to the hospital.   This information is not intended to replace advice given to you by your health care provider. Make sure you discuss any questions you have with your health care provider.   Document Released:  08/26/2011 Document Revised: 07/11/2014 Document Reviewed: 04/29/2013 Elsevier Interactive Patient Education 2016 Elsevier [employer]. Coronary Angiogram With Stent, Care After Refer to this sheet in the next few weeks. These instructions provide you with information about caring for yourself after your procedure. Your health care provider may also give you more specific instructions. Your treatment has been planned according to current medical practices, but problems sometimes occur. Call your health care provider if you have any problems or questions after your procedure. WHAT TO EXPECT AFTER THE PROCEDURE  After your procedure, it is typical to have the following:  Bruising at the catheter insertion site that usually fades within 1-2 weeks.  Blood collecting in the tissue (hematoma) that may be painful to the touch. It should usually decrease in size and tenderness within 1-2 weeks. HOME CARE INSTRUCTIONS  Take medicines only as directed by your health care provider. Blood thinners may be prescribed after your procedure to improve blood flow through the stent.  You may shower 24-48 hours after the procedure or as directed by your health care provider. Remove the bandage (dressing) and gently wash the catheter insertion site with plain soap and water. Pat the area dry with a clean towel. Do not rub the site, because this may cause bleeding.  Do not take baths, swim, or use a hot tub until your health care provider approves.  Check your catheter insertion site every day for redness, swelling, or drainage.  Do not apply powder or lotion to the site.  Do not lift over 10  lb (4.5 kg) for 5 days after your procedure or as directed by your health care provider.  Ask your health care provider when it is okay to:  Return to work or school.  Resume usual physical activities or sports.  Resume sexual activity.  Eat a heart-healthy diet. This should include plenty of fresh fruits and vegetables.  Meat should be lean cuts. Avoid the following types of food:  Food that is high in salt.  Canned or highly processed food.  Food that is high in saturated fat or sugar.  Fried food.  Make any other lifestyle changes as recommended by your health care provider. These may include:  Not using any tobacco products, including cigarettes, chewing tobacco, or electronic cigarettes.If you need help quitting, ask your health care provider.  Managing your weight.  Getting regular exercise.  Managing your blood pressure.  Limiting your alcohol intake.  Managing other health problems, such as diabetes.  If you need an MRI after your heart stent has been placed, be sure to tell the health care provider who orders the MRI that you have a heart stent.  Keep all follow-up visits as directed by your health care provider. This is important. SEEK MEDICAL CARE IF:  You have a fever.  You have chills.  You have increased bleeding from the catheter insertion site. Hold pressure on the site. SEEK IMMEDIATE MEDICAL CARE IF:  You develop chest pain or shortness of breath, feel faint, or pass out.  You have unusual pain at the catheter insertion site.  You have redness, warmth, or swelling at the catheter insertion site.  You have drainage (other than a small amount of blood on the dressing) from the catheter insertion site.  The catheter insertion site is bleeding, and the bleeding does not stop after 30 minutes of holding steady pressure on the site.  You develop bleeding from any other place, such as from your rectum. There may be bright red blood in your urine or stool, or it may appear as black, tarry stool.   This information is not intended to replace advice given to you by your health care provider. Make sure you discuss any questions you have with your health care provider.   Document Released: 09/13/2004 Document Revised: 03/17/2014 Document Reviewed: 07/19/2012 Elsevier  Interactive Patient Education [employer].

## 2015-11-07 NOTE — Progress Notes (Signed)
When I went to discharge the pt he and his family reported he is having some difficulty forming his words. This began yesterday after his PCI and has persisted. Discussed with Dr [place]. Will obtain an MRI, if positive for CVA will ask Neurology to see, if negative consider late discharge.  Kerin Ransom PA-C 11/07/2015 11:42 AM

## 2015-11-07 NOTE — Progress Notes (Signed)
Benefits check: Brilinta     S/[facility] @ [facility] # [phone]   BRILINTA 90 MG BID ( 30 )   COVER- YES  CO-PAY- $ 45.00  60 TAB  PRIOR APPROVAL - NO  PHARMACY : [facility], Jadene Pierini RN,BSN,CM [phone]

## 2015-11-07 NOTE — Progress Notes (Addendum)
TELEMETRY: Reviewed telemetry pt in NSR. occ PVC Vitals:   11/07/15 0000 11/07/15 0400 11/07/15 0401 11/07/15 0834  BP:  116/68  121/78  Pulse: (!) 57 (!) 59  65  Resp:  18  18  Temp:  97.6 F (36.4 C)  97.7 F (36.5 C)  TempSrc:  Oral  Oral  SpO2: 95% 96%  94%  Weight:   217 lb 6 oz (98.6 kg)   Height:        Intake/Output Summary (Last 24 hours) at 11/07/15 0941 Last data filed at 11/07/15 0815  Gross per 24 hour  Intake           793.75 ml  Output              800 ml  Net            -6.25 ml   Filed Weights   11/05/15 0400 11/06/15 0300 11/07/15 0401  Weight: 218 lb 11.1 oz (99.2 kg) 219 lb 12.8 oz (99.7 kg) 217 lb 6 oz (98.6 kg)    Subjective  Feels well this am.  No chest pain or dyspnea. Wants to go home.  Marland Kitchen aspirin  81 mg Oral Daily  . atorvastatin  80 mg Oral q1800  . carvedilol  6.25 mg Oral BID WC  . enoxaparin (LOVENOX) injection  40 mg Subcutaneous Q24H  . losartan  100 mg Oral Daily  . mouth rinse  15 mL Mouth Rinse BID  . multivitamin  1 tablet Oral Daily  . pantoprazole  40 mg Oral Daily  . sodium chloride flush  3 mL Intravenous Q12H  . sodium chloride flush  3 mL Intravenous Q12H  . spironolactone  25 mg Oral Daily  . ticagrelor  90 mg Oral BID  . vitamin B-12  5,000 mcg Oral Daily      LABS: Basic Metabolic Panel:  Recent Labs  11/04/15 1744  11/06/15 0348 11/07/15 0322  NA 131*  < > 140 140  K 3.4*  < > 3.5 3.4*  CL 97*  < > 103 103  CO2 24  < > 30 28  GLUCOSE 103*  < > 92 92  BUN 28*  < > 21* 20  CREATININE 0.94  < > 0.91 1.03  CALCIUM 8.6*  < > 8.8* 8.5*  MG 1.9  --   --   --   < > = values in this interval not displayed. Liver Function Tests:  Recent Labs  11/04/15 1744  AST 194*  ALT 39  ALKPHOS 67  BILITOT 1.3*  PROT 6.8  ALBUMIN 3.5   No results for input(s): LIPASE, AMYLASE in the last 72 hours. CBC:  Recent Labs  11/04/15 1330  11/06/15 0348 11/07/15 0322  WBC 12.0*  < > 10.5 10.8*  NEUTROABS 9.6*  --    --   --   HGB 14.8  < > 12.7* 12.4*  HCT 42.5  < > 38.9* 38.4*  MCV 94.7  < > 98.2 98.7  PLT 282  < > 232 239  < > = values in this interval not displayed. Cardiac Enzymes:  Recent Labs  11/06/15 1610 11/06/15 2122 11/07/15 0322  TROPONINI 14.85* 23.75* 25.64*   BNP: No results for input(s): PROBNP in the last 72 hours. D-Dimer: No results for input(s): DDIMER in the last 72 hours. Hemoglobin A1C:  Recent Labs  11/04/15 1745  HGBA1C 5.3   Fasting Lipid Panel:  Recent Labs  11/05/15 0404  CHOL  98  HDL 29*  LDLCALC 51  TRIG 88  CHOLHDL 3.4   Thyroid Function Tests: No results for input(s): TSH, T4TOTAL, T3FREE, THYROIDAB in the last 72 hours.  Invalid input(s): FREET3   Radiology/Studies:  No results found.   Ecg today shows NSR with LAD, ST elevation resolved. Recent inferoposterior infarct. No acute change. I have personally reviewed and interpreted this study.  Echo: Study Conclusions  - Left ventricle: The cavity size was normal. Wall thickness was   increased in a pattern of moderate LVH. Systolic function was   mildly reduced. The estimated ejection fraction was in the range   of 45% to 50%. Inferolateral hypokinesis. Doppler parameters are   consistent with abnormal left ventricular relaxation (grade 1   diastolic dysfunction). The E/e&' ratio is >15, suggesting   elevated LV filling pressure. - Aortic valve: Trileaflet. Sclerosis without stenosis. - Mitral valve: Calcified annulus. There was trivial regurgitation. - Left atrium: The atrium was mildly dilated. - Inferior vena cava: The vessel was normal in size. The   respirophasic diameter changes were in the normal range (>= 50%),   consistent with normal central venous pressure.  Impressions:  - LVEF 45-50% with moderate LVH and inferolateral hypokinesis,   diastolic dysfunction with elevated LV filling pressure, aortic   valve sclerosis, trivial MR, mild LAE, normal IVC.   PHYSICAL  EXAM General: Well developed, well nourished, in no acute distress. Head: Normocephalic, atraumatic, sclera non-icteric, oropharynx is clear Neck: Negative for carotid bruits. JVD not elevated. No adenopathy Lungs: Clear bilaterally to auscultation without wheezes, rales, or rhonchi. Breathing is unlabored. Heart: RRR S1 S2 without murmurs, rubs, or gallops.  Abdomen: Soft, non-tender, non-distended with normoactive bowel sounds. No hepatomegaly. No rebound/guarding. No obvious abdominal masses. Msk:  Strength and tone appears normal for age. Extremities: No clubbing, cyanosis or edema.  Distal pedal pulses are 2+ and equal bilaterally. Neuro: Alert and oriented X 3. Moves all extremities spontaneously. Psych:  Responds to questions appropriately with a normal affect.  ASSESSMENT AND PLAN: 1. Acute inferolateral STEMI. S/p emergent DES of a very large LCx with extensive thrombus. Significant residual disease in the proximal LAD and distal RCA. Now s/p DES of proximal LAD and distal RCA. Repeat angiography showed complete resolution of thrombus in the LCx territory. On ASA and Brilinta.    On beta blocker and statin ( even though lipids levels look good). No recurrent chest pain. No Ecg changes. Troponin levels elevated but I am not sure why this was even ordered. No clinical event to correlate with this.  2. HTN currently controlled on losartan, aldactone, and Coreg. 3. NSVT post reperfusion. Resolved. 4. Acute systolic CHF. Severe ischemic LV dysfunction. Appeared out of proportion to infarct although infarct was large. EF improved by Echo and now 40-45%. Does not need LifeVest. Continue Coreg, losartan and aldactone. Would repeat Echo in 3 months. 5. Hypokalemia. Replete  Will ambulate with Cardiac Rehab today. He is ready for DC later today. Will need BMET as outpatient and close follow up. Recommend outpatient Cardiac Rehab.    Present on Admission: . Acute ST elevation myocardial  infarction (STEMI) involving left circumflex coronary artery ([place]) . Essential hypertension   Signed, Norton Bivins [place], [place] 11/07/2015 9:41 AM

## 2015-11-07 NOTE — Progress Notes (Signed)
CARDIAC REHAB PHASE I   PRE:  Rate/Rhythm: 51 SR  BP:  Sitting: 122/69        SaO2: 95 RA  MODE:  Ambulation: 350 ft   POST:  Rate/Rhythm: 76 SR  BP:  Sitting: 126/71         SaO2: 98 RA  Pt ambulated 350 ft on RA, handheld assist, fairly steady gait, tolerated well, denies any complaints. Completed MI/stent education.  Reviewed risk factors, MI book, anti-platelet therapy, stent card, activity restrictions, ntg, exercise, heart healthy diet, sodium restrictions, and phase 2 cardiac rehab. Pt verbalized understanding, receptive to education. Pt agrees to phase 2 cardiac rehab, will send referral to [facility] per pt request. Pt to recliner after walk, call bell within reach. Will follow.   [phone] Lenna Sciara, RN, BSN 11/07/2015 10:36 AM

## 2015-11-07 NOTE — Consult Note (Signed)
Neurology Consult Note  Requesting Physician: Dr. Martinique     Reason for consultation:  Difficulty speaking  HPI:                                                                                                                                         Jose Ware is a 81 year old man with history of HTN, prostate adenoCA s/p XRT and rad seed implant in 2010, psoriasis admitted on 8/27 with acute inferolateral STEMI who has developed difficulty with speech. He was administered ASA, heparin and proceeded to emergent cardiac cath with PCI. 75% stenosis in RCA, 80% stenosis in LAD. DES placed in mid circumflex lesion. LV EF was 25-35%. He was placed on dual antiplatelet therapy for one year. He went back for staged cath on 8/29 where he underwent DES to his proximal LAD and DES to distal RCA. His speech difficulty began after this PCI and persisted. He is able to think of the words he wants to say but occasionally parts of the word are not vocalized. MRI obtained which shows scattered small acute infarcts in the bilateral anterior and posterior circulation. Concerning for emboli. No associated hemorrhage or mass effect.  His wife reports that his speech is quieter and he is speaking more slowly. She denies dysarthria.   Mother had stroke in her 51s and older sister died of stroke and MI in her 61s.  Date last known well:  Yesterday  Time last known well:  Afternoon tPA Given: No: outside window  Stroke Risk Factors - family history and hypertension  Past Medical History: Past Medical History:  Diagnosis Date  . Adenocarcinoma of prostate [facility])    XRT & Radiation seed implantation in 2010  . Adenomatous colon polyp   . Diverticulitis   . DIVERTICULITIS, HX OF 11/27/2006       . DJD (degenerative joint disease)    wrist - R   . Hernia   . History of blood transfusion 1985   with colon surgery  . Hypertension   . Nephrolithiasis   . NEPHROLITHIASIS, HX OF 11/27/2006        .  Peripheral neuropathy ([place])   . Psoriasis   . Strep throat   . Tenosynovitis     Past Surgical History:  Procedure Laterality Date  . APPENDECTOMY  1985  . BACK SURGERY  1980   minor disk surgery: instrumentation placed and removed in a second procedure  . CARDIAC CATHETERIZATION N/A 11/04/2015   Procedure: Left Heart Cath and Coronary Angiography;  Surgeon: Peter M Martinique, MD;  Location: [place] [facility];  Service: Cardiovascular;  Laterality: N/A;  . CARDIAC CATHETERIZATION N/A 11/04/2015   Procedure: Coronary Stent Intervention;  Surgeon: Peter M Martinique, MD;  Location: [place] [facility];  Service: Cardiovascular;  Laterality: N/A;  . CARDIAC CATHETERIZATION N/A 11/06/2015   Procedure: Coronary Stent Intervention;  Surgeon: Shanon Brow  Loren Racer, MD;  Location: [place] [facility];  Service: Cardiovascular;  Laterality: N/A;  . CATARACT EXTRACTION, BILATERAL  2013  . CHOLECYSTECTOMY  1986  . COLON SURGERY  1980's   pt. had ileus, had colon surgery, requiring colostomy & then reversal & then dehisence of that wound & return to OR for repair & cholecystectomy    . COLONOSCOPY    . COLOSTOMY  1985   After colectomy for diverticulitis, pt. remarks during this surgery they "gave me the paddles two times  because of bleeding", pt. states it was unrelated to any anesthesia complication  . EYE SURGERY     /w IOL  . KNEE ARTHROSCOPY Left 2003  . KNEE SURGERY  1956   Left 1956, Right w/cartilage removed later  . PARATHYROIDECTOMY N/A 05/12/2014   Procedure: PARATHYROIDECTOMY;  Surgeon: Armandina Gemma, MD;  Location: [place];  Service: General;  Laterality: N/A;  . REVERSAL OF COLOSTOMY  1987  . TONSILLECTOMY  1946  . WRIST SURGERY Left    Remote complicated w/infection    Family History: Family History  Problem Relation Age of Onset  . Coronary artery disease Mother   . Alcohol abuse Father   . Cirrhosis Father   . Heart failure Sister   . Breast cancer Sister     W/involvement of right arm  leading to amputation  . Coronary artery disease Brother   . Heart attack Brother   . Clotting disorder Brother   . Prostate cancer Neg Hx   . Colon cancer Neg Hx   . Diabetes Neg Hx   . Glaucoma Neg Hx   . Rectal cancer Neg Hx   . Esophageal cancer Neg Hx     Social History:   reports that he quit smoking about 32 years ago. He has never used smokeless tobacco. He reports that he drinks alcohol. He reports that he does not use drugs.  Allergies:  Allergies  Allergen Reactions  . Bee Venom Swelling    Facial swelling  . Betadine [Povidone Iodine] Other (See Comments)    blistering     Medications:                                                                                                                         Current Facility-Administered Medications:  .  0.9 %  sodium chloride infusion, 250 mL, Intravenous, PRN, Peter M Martinique, MD .  0.9 %  sodium chloride infusion, 250 mL, Intravenous, PRN, Nelva Bush, MD .  acetaminophen (TYLENOL) tablet 650 mg, 650 mg, Oral, Q4H PRN, Peter M Martinique, MD .  aspirin chewable tablet 81 mg, 81 mg, Oral, Daily, Peter M Martinique, MD, 81 mg at 11/07/15 1027 .  atorvastatin (LIPITOR) tablet 80 mg, 80 mg, Oral, q1800, Peter M Martinique, MD, 80 mg at 11/06/15 1721 .  carvedilol (COREG) tablet 6.25 mg, 6.25 mg, Oral, BID WC, Peter M Martinique, MD, 6.25 mg at 11/07/15 1028 .  enoxaparin (LOVENOX) injection  40 mg, 40 mg, Subcutaneous, Q24H, Peter M Martinique, MD, 40 mg at 11/07/15 1027 .  losartan (COZAAR) tablet 100 mg, 100 mg, Oral, Daily, Peter M Martinique, MD, 100 mg at 11/07/15 1028 .  MEDLINE mouth rinse, 15 mL, Mouth Rinse, BID, Peter M Martinique, MD, 15 mL at 11/06/15 2117 .  multivitamin (PROSIGHT) tablet 1 tablet, 1 tablet, Oral, Daily, Peter M Martinique, MD, 1 tablet at 11/07/15 1028 .  nitroGLYCERIN (NITROSTAT) SL tablet 0.4 mg, 0.4 mg, Sublingual, Q5 Min x 3 PRN, Peter M Martinique, MD .  ondansetron [facility]) injection 4 mg, 4 mg, Intravenous, Q6H PRN,  Peter M Martinique, MD .  pantoprazole (PROTONIX) EC tablet 40 mg, 40 mg, Oral, Daily, Peter M Martinique, MD, 40 mg at 11/07/15 1027 .  sodium chloride flush (NS) 0.9 % injection 3 mL, 3 mL, Intravenous, Q12H, Peter M Martinique, MD, 3 mL at 11/07/15 1029 .  sodium chloride flush (NS) 0.9 % injection 3 mL, 3 mL, Intravenous, PRN, Peter M Martinique, MD .  sodium chloride flush (NS) 0.9 % injection 3 mL, 3 mL, Intravenous, Q12H, Christopher End, MD, 3 mL at 11/07/15 1029 .  sodium chloride flush (NS) 0.9 % injection 3 mL, 3 mL, Intravenous, PRN, Nelva Bush, MD .  spironolactone (ALDACTONE) tablet 25 mg, 25 mg, Oral, Daily, Peter M Martinique, MD, 25 mg at 11/07/15 1028 .  ticagrelor (BRILINTA) tablet 90 mg, 90 mg, Oral, BID, Peter M Martinique, MD, 90 mg at 11/07/15 1028 .  vitamin B-12 (CYANOCOBALAMIN) tablet 5,000 mcg, 5,000 mcg, Oral, Daily, Peter M Martinique, MD, 5,000 mcg at 11/07/15 1028   ROS:                                                                                                                                       History obtained from the patient  Constitutional: no fevers/chills Eyes: no vision changes Ears, nose, mouth, throat, and face: no cough Respiratory: no shortness of breath Cardiovascular: no chest pain Gastrointestinal: no nausea/vomiting, no abdominal pain, no constipation, no diarrhea Genitourinary: no dysuria, no hematuria Integument: no rash Hematologic/lymphatic: no bleeding, +bruising, no edema Musculoskeletal: +chronic arthralgias, no myalgias Neurological: no paresthesias, no weakness   Neurologic Examination:                                                                                                    Today's Vitals   11/07/15 0834 11/07/15 1127 11/07/15 1134 11/07/15 1520  BP: 121/78  124/75 124/75  Pulse: 65  (!) 56 (!) 57  Resp: 18  18 (!) 21  Temp: 97.7 F (36.5 C)  97.7 F (36.5 C) 98 F (36.7 C)  TempSrc: Oral  Oral Oral  SpO2: 94%  96% 98%  Weight:       Height:      PainSc:  0-No pain      Physical exam  Temp:  [97.6 F (36.4 C)-98.8 F (37.1 C)] 98 F (36.7 C) (08/30 1520) Pulse Rate:  [53-65] 57 (08/30 1520) Resp:  [18-21] 21 (08/30 1520) BP: (96-124)/(65-86) 124/75 (08/30 1520) SpO2:  [94 %-100 %] 98 % (08/30 1520) Weight:  [98.6 kg (217 lb 6 oz)] 98.6 kg (217 lb 6 oz) (08/30 0401)  General Apperance: NAD HEENT: Normocephalic, atraumatic, anicteric sclera Neck: Supple, trachea midline Heart: Regular rate and rhythm Abdomen: Soft, nontender, nondistended Extremities: Warm and well perfused Skin: scattered ecchymoses Neurologic:  Mental Status -  Level of arousal and orientation to time, place, and person were intact. Language including expression, naming, comprehension was assessed and found intact. Mild difficulty with repetition - stumbled on "No" of "no ifs ands or buts" Attention span and concentration were normal. Remote memory were intact. Remembered 1/3 but then was able to remember rest of words with prompting. Fund of Knowledge was assessed and was intact.  Cranial Nerves II - XII - II - Visual field intact OU. III, IV, VI - Extraocular movements intact. V - Facial sensation intact bilaterally. VII - Facial movement intact bilaterally. VIII - Hearing & vestibular intact bilaterally. X - Palate elevates symmetrically. XI - Chin turning & shoulder shrug intact bilaterally. XII - Tongue protrusion intact.  Motor Strength - The patient's strength was normal in all extremities and pronator drift was absent.  Bulk was normal and fasciculations were absent.   Motor Tone - Muscle tone was assessed at the neck and appendages and was normal.  Reflexes - The patient's reflexes were 1+ in all extremities and he had no pathological reflexes.  Sensory - Light touch, temperature/pinprick, vibration and proprioception, and Romberg testing were assessed and were symmetrical.    Coordination - Mild end point tremor on  finger to nose. No ataxia on heel to shin.  Gait and Station - Deferred.   Lab Results: Basic Metabolic Panel:  Recent Labs Lab 11/04/15 1330 11/04/15 1646 11/04/15 1744 11/05/15 0404 11/06/15 0348 11/07/15 0322  NA 136  --  131* 139 140 140  K 3.7  --  3.4* 3.4* 3.5 3.4*  CL 101  --  97* 103 103 103  CO2 26  --  24 27 30 28   GLUCOSE 132*  --  103* 108* 92 92  BUN 35*  --  28* 23* 21* 20  CREATININE 1.03 0.98 0.94 0.94 0.91 1.03  CALCIUM 9.1  --  8.6* 8.6* 8.8* 8.5*  MG  --   --  1.9  --   --   --     Liver Function Tests:  Recent Labs Lab 11/04/15 1744  AST 194*  ALT 39  ALKPHOS 67  BILITOT 1.3*  PROT 6.8  ALBUMIN 3.5    CBC:  Recent Labs Lab 11/04/15 1330 11/04/15 1646 11/04/15 2216 11/05/15 0404 11/06/15 0348 11/07/15 0322  WBC 12.0* 12.7*  --  11.7* 10.5 10.8*  NEUTROABS 9.6*  --   --   --   --   --   HGB 14.8 14.4  --  13.5 12.7* 12.4*  HCT 42.5 42.8  --  41.4 38.9* 38.4*  MCV 94.7 96.4  --  98.1 98.2 98.7  PLT 282 275 268 248 232 239    Cardiac Enzymes:  Recent Labs Lab 11/06/15 1211 11/06/15 1610 11/06/15 2122 11/07/15 0322 11/07/15 0858  TROPONINI 27.76* 14.85* 23.75* 25.64* 19.82*    Lipid Panel:  Recent Labs Lab 11/05/15 0404  CHOL 98  TRIG 88  HDL 29*  CHOLHDL 3.4  VLDL 18  LDLCALC 51    Microbiology: Results for orders placed or performed during the hospital encounter of 11/04/15  MRSA PCR Screening     Status: None   Collection Time: 11/04/15  3:44 PM  Result Value Ref Range Status   MRSA by PCR NEGATIVE NEGATIVE Final    Comment:        The GeneXpert MRSA Assay (FDA approved for NASAL specimens only), is one component of a comprehensive MRSA colonization surveillance program. It is not intended to diagnose MRSA infection nor to guide or monitor treatment for MRSA infections.      Imaging: Mr Brain Wo Contrast  Result Date: 11/07/2015 CLINICAL DATA:  81 year old male with word finding difficulty for  2 days. Recent cardiac intervention. Initial encounter. EXAM: MRI HEAD WITHOUT CONTRAST TECHNIQUE: Multiplanar, multiecho pulse sequences of the brain and surrounding structures were obtained without intravenous contrast. COMPARISON:  Paranasal sinus CT 11/27/2014. FINDINGS: Scattered small, mostly punctate, foci of restricted diffusion in both cerebral and cerebellar hemispheres. There is left MCA vascular territory involvement, most pronounced in the subcortical and central white matter of the left middle frontal gyrus as seen on series 3, image 35. Right cerebellar tonsil involvement also noted. Associated mild T2 hyperintensity in the affected areas with no associated hemorrhage or mass effect. Major intracranial vascular flow voids are preserved, dominant appearing distal left vertebral artery. No midline shift, mass effect, evidence of mass lesion, ventriculomegaly, extra-axial collection or acute intracranial hemorrhage. Cervicomedullary junction and pituitary are within normal limits. Outside of the acute findings gray and white matter signal is normal for age. There is a small 6 mm left lateral ventricle subependymal nodule (series 4, image 17) which does not appear mineralized on T2 * imaging. No similar finding elsewhere. Negative visualized cervical spine. Visible internal auditory structures appear normal. Visualized bone marrow signal is within normal limits. Visualized paranasal sinuses and mastoids are stable and well pneumatized. Postoperative changes to both globes. Negative scalp soft tissues. IMPRESSION: 1. Scattered small acute infarcts in the bilateral anterior and posterior circulation compatible with sequelae of emboli. No associated hemorrhage or mass effect. 2. No other acute intracranial abnormality. A sub centimeter left lateral ventricle subependymal nodule is nonspecific but significance is doubtful. Electronically Signed   By: Genevie Ann M.D.   On: 11/07/2015 14:16   Dg Chest Port 1  View  Result Date: 11/04/2015 CLINICAL DATA:  Chest pain EXAM: PORTABLE CHEST 1 VIEW COMPARISON:  02/12/2009 FINDINGS: 1627 hours. Lordotic positioning. The lungs are clear wiithout focal pneumonia, edema, pneumothorax or pleural effusion. Cardiopericardial silhouette is at upper limits of normal for size. The visualized bony structures of the thorax are intact. Telemetry leads overlie the chest. IMPRESSION: No active disease. Electronically Signed   By: Misty Stanley M.D.   On: 11/04/2015 17:12     Assessment and plan:  81 year old man with history of HTN, prostate adenoCA s/p XRT and rad seed implant in 2010, psoriasis admitted on 8/27 with acute inferolateral STEMI who has developed difficulty with speech. MRI obtained which shows scattered small acute infarcts  in the bilateral anterior and posterior circulation. Concerning for emboli.  Stroke Risk Factors - family history, hypertension and s/p PCI  Hgb A1c 5.3 Lipid Panel     Component Value Date/Time   CHOL 98 11/05/2015 0404   TRIG 88 11/05/2015 0404   HDL 29 (L) 11/05/2015 0404   CHOLHDL 3.4 11/05/2015 0404   VLDL 18 11/05/2015 0404   LDLCALC 51 11/05/2015 0404   LDLDIRECT 68.1 12/01/2013 1338  Echo with LV EF [id number], grade 1 diastolic dysfunction, inferolateral hypokinesis  Plan: 1. Carotid dopplers 2. Prophylactic therapy-ASA+Brillinta 3. Risk factor modification 4. Telemetry monitoring 5. Frequent neuro checks 6. SLP consult  Jacques Earthly, MD  Internal Medicine  PGY-3 11/07/15 5:34 PM  Neurology Attending Addendum  This patient was seen, examined, and d/w Dr. Randell Patient. I have reviewed the note and agree with the findings, assessment and plan as documented with the following additions.   In brief, This is a 81 year old right-handed man who was admitted for treatment of acute STEMI. Following cardiac catheterization on 11/06/15, he noted some difficulty with speech. He and his wife noted that he would leave out parts of  words when trying to speak. They also report that he is voice has become a bit softer. No other deficits reported.  I have reviewed past medical history, past surgical history, social history, family history and these are as noted by Dr. Randell Patient. No additions.  Review of systems, the patient has no specific complaints at this time apart from his speech as noted in the history of present illness.  On examination, speech is clear. I am not appreciating any of the difficulties the patient is reporting. No dysarthria or aphasia. He has a mild intention tremor in both hands which she reports is chronic. He had about 4 beats of nystagmus with right lateral gaze, but this extinguished on repeat examination. The elemental neurologic examination is otherwise unremarkable.  I have personally and individually and reviewed the MRI scan of the brain without contrast from 11/07/15. This shows several small scattered areas of restricted diffusion involving bilateral cerebellum, both parietal lobes, and both frontal lobes. These are consistent with acute ischemic infarctions and are embolic in nature. Mild diffuse generalized atrophy is present, likely appropriate for age.  Transthoracic echocardiogram from 11/05/15 showed moderate LVH, ejection fraction 45-50%, inferolateral hypokinesis, grade 1 diastolic dysfunction, calcified mitral annulus, and mild dilatation of the left atrium.  Carotid Dopplers are pending.  Impression: 1. Acute embolic stroke 2. Dysarthria  Recommendations:  These are as per Dr. Marjory Sneddon note. He has embolic strokes involving both posterior and anterior circulations bilaterally that occurred in the setting of cardiac catheterization. These are most likely due to emboli from the aortic arch during the procedure. Echocardiogram did not reveal any embolic source in the heart. Carotid Dopplers have been ordered and are pending at the time of this note. Carotid pathology would not explain his  current strokes but if he has any significant stenosis this would need to be addressed to reduce long-term stroke risk. Asymptomatic stenosis greater than 60% would warrant referral to vascular surgery for definitive management recommendations. At this time, he is on aspirin and Brilinta which are suitable for secondary stroke prevention. He can be transitioned to a single antiplatelet therapy for long-term secondary prevention once suitable from a cardiac perspective. Continue with aggressive risk factor modification. Continue atorvastatin. I do not anticipate that he will have any significant permanent residual from his strokes given his current examination.  This  was discussed with the patient and his wife. They were given the opportunity to ask any questions and these were addressed to their satisfaction.  Thank you for the opportunity to participate in this patient's care. Please don't hesitate to call with any questions or concerns.

## 2015-11-08 ENCOUNTER — Other Ambulatory Visit: Payer: Medicare Other

## 2015-11-08 ENCOUNTER — Encounter (HOSPITAL_COMMUNITY): Payer: Self-pay | Admitting: Physician Assistant

## 2015-11-08 ENCOUNTER — Inpatient Hospital Stay (HOSPITAL_COMMUNITY): Payer: Medicare Other

## 2015-11-08 DIAGNOSIS — I6340 Cerebral infarction due to embolism of unspecified cerebral artery: Secondary | ICD-10-CM

## 2015-11-08 DIAGNOSIS — Z8673 Personal history of transient ischemic attack (TIA), and cerebral infarction without residual deficits: Secondary | ICD-10-CM | POA: Diagnosis present

## 2015-11-08 DIAGNOSIS — Z9861 Coronary angioplasty status: Secondary | ICD-10-CM

## 2015-11-08 DIAGNOSIS — Z8679 Personal history of other diseases of the circulatory system: Secondary | ICD-10-CM

## 2015-11-08 DIAGNOSIS — I639 Cerebral infarction, unspecified: Secondary | ICD-10-CM

## 2015-11-08 DIAGNOSIS — I2510 Atherosclerotic heart disease of native coronary artery without angina pectoris: Secondary | ICD-10-CM | POA: Diagnosis present

## 2015-11-08 DIAGNOSIS — I255 Ischemic cardiomyopathy: Secondary | ICD-10-CM | POA: Diagnosis present

## 2015-11-08 LAB — CBC
HCT: 37.8 % — ABNORMAL LOW (ref 39.0–52.0)
HEMOGLOBIN: 12.1 g/dL — AB (ref 13.0–17.0)
MCH: 31.3 pg (ref 26.0–34.0)
MCHC: 32 g/dL (ref 30.0–36.0)
MCV: 97.9 fL (ref 78.0–100.0)
PLATELETS: 233 10*3/uL (ref 150–400)
RBC: 3.86 MIL/uL — ABNORMAL LOW (ref 4.22–5.81)
RDW: 15.1 % (ref 11.5–15.5)
WBC: 10.7 10*3/uL — ABNORMAL HIGH (ref 4.0–10.5)

## 2015-11-08 LAB — VAS US CAROTID
LCCADDIAS: -14 cm/s
LCCADSYS: -71 cm/s
LCCAPDIAS: 16 cm/s
LCCAPSYS: 91 cm/s
LEFT ECA DIAS: -13 cm/s
LEFT VERTEBRAL DIAS: -17 cm/s
LICADSYS: -88 cm/s
Left ICA dist dias: -27 cm/s
Left ICA prox dias: -21 cm/s
Left ICA prox sys: -77 cm/s
RCCADSYS: -85 cm/s
RCCAPSYS: 135 cm/s
RIGHT ECA DIAS: -19 cm/s
RIGHT VERTEBRAL DIAS: -17 cm/s
Right CCA prox dias: 19 cm/s

## 2015-11-08 MED FILL — Nitroglycerin IV Soln 100 MCG/ML in D5W: INTRA_ARTERIAL | Qty: 10 | Status: AC

## 2015-11-08 NOTE — Evaluation (Signed)
Speech Language Pathology Evaluation Patient Details Name: Jose Ware MRN: [id number] DOB: 14-Jun-1934 Today's Date: 11/08/2015 Time: 1030-1100 SLP Time Calculation (min) (ACUTE ONLY): 30 min  Problem List:  Patient Active Problem List   Diagnosis Date Noted  . Acute CVA (cerebrovascular accident) ([place]) 11/08/2015  . Coronary artery disease involving native coronary artery   . Acute ST elevation myocardial infarction (STEMI) involving left circumflex coronary artery ([place]) 11/04/2015  . History of adenomatous polyp of colon 01/18/2015  . Primary hyperparathyroidism ([place]) 12/28/2013  . History of stomach cancer 12/01/2013  . Fatigue 12/01/2013  . Lower back pain 03/24/2013  . Bradycardia 09/23/2012  . Bruising 09/23/2012  . B12 deficiency 09/17/2010  . ADENOCARCINOMA, PROSTATE 12/14/2008  . COLONIC POLYPS, HX OF 12/14/2008  . PERIPHERAL NEUROPATHY 10/18/2007  . PSORIASIS 10/15/2007  . Essential hypertension 11/27/2006  . DEGENERATIVE JOINT DISEASE 11/27/2006  . NEPHROLITHIASIS, HX OF 11/27/2006   Past Medical History:  Past Medical History:  Diagnosis Date  . Adenocarcinoma of prostate [facility])    XRT & Radiation seed implantation in 2010  . Adenomatous colon polyp   . Diverticulitis   . DIVERTICULITIS, HX OF 11/27/2006       . DJD (degenerative joint disease)    wrist - R   . Hernia   . History of blood transfusion 1985   with colon surgery  . Hypertension   . Nephrolithiasis   . NEPHROLITHIASIS, HX OF 11/27/2006        . Peripheral neuropathy ([place])   . Psoriasis   . Strep throat   . Tenosynovitis    Past Surgical History:  Past Surgical History:  Procedure Laterality Date  . APPENDECTOMY  1985  . BACK SURGERY  1980   minor disk surgery: instrumentation placed and removed in a second procedure  . CARDIAC CATHETERIZATION N/A 11/04/2015   Procedure: Left Heart Cath and Coronary Angiography;  Surgeon: Peter M Martinique, MD;  Location: [place] [facility];  Service:  Cardiovascular;  Laterality: N/A;  . CARDIAC CATHETERIZATION N/A 11/04/2015   Procedure: Coronary Stent Intervention;  Surgeon: Peter M Martinique, MD;  Location: [place] [facility];  Service: Cardiovascular;  Laterality: N/A;  . CARDIAC CATHETERIZATION N/A 11/06/2015   Procedure: Coronary Stent Intervention;  Surgeon: Leonie Man, MD;  Location: [place] [facility];  Service: Cardiovascular;  Laterality: N/A;  . CATARACT EXTRACTION, BILATERAL  2013  . CHOLECYSTECTOMY  1986  . COLON SURGERY  1980's   pt. had ileus, had colon surgery, requiring colostomy & then reversal & then dehisence of that wound & return to OR for repair & cholecystectomy    . COLONOSCOPY    . COLOSTOMY  1985   After colectomy for diverticulitis, pt. remarks during this surgery they "gave me the paddles two times  because of bleeding", pt. states it was unrelated to any anesthesia complication  . EYE SURGERY     /w IOL  . KNEE ARTHROSCOPY Left 2003  . KNEE SURGERY  1956   Left 1956, Right w/cartilage removed later  . PARATHYROIDECTOMY N/A 05/12/2014   Procedure: PARATHYROIDECTOMY;  Surgeon: Armandina Gemma, MD;  Location: [place];  Service: General;  Laterality: N/A;  . REVERSAL OF COLOSTOMY  1987  . TONSILLECTOMY  1946  . WRIST SURGERY Left    Remote complicated w/infection   HPI:  Adm with acute inferolateral STEMI. S/p emergent DES 8/28 of LCx with extensive thrombus. NSVT post reperfusion. Severe ischemic LV dysfunction. 8/29 staged PCI of the LAD  and RCA, now with L MCA distribution embolic CVA  PMHx- HTN, prostate Ca, DJD   Assessment / Plan / Recommendation Clinical Impression  Pt presents with mild expressive aphasia which presents with complex communication or times of increased stress/fatigue. Increased time and circumlocution are both effective means of management. Recommend: Outpatient therapy if aphasia symptoms persist. Pt's spouse to provide supervision for high risk cognitive activities in the home such as  medication and financial management. Pt/spouse verbalized understanding and agreement with recommendations and POC.     SLP Assessment  Patient needs continued Speech Lanaguage Pathology Services    Follow Up Recommendations  Outpatient SLP    Frequency and Duration min 1 x/week         SLP Evaluation Prior Functioning  Cognitive/Linguistic Baseline: Within functional limits Type of Home: House  Lives With: Spouse Available Help at Discharge: Family;Available 24 hours/day Vocation: Retired   Associate Professor  Overall Cognitive Status: Within Functional Limits for tasks assessed Arousal/Alertness: Awake/alert Orientation Level: Oriented X4    Comprehension  Auditory Comprehension Overall Auditory Comprehension: Appears within functional limits for tasks assessed Yes/No Questions: Within Functional Limits Commands: Within Functional Limits Conversation: Complex Reading Comprehension Reading Status: Within funtional limits    Expression Expression Primary Mode of Expression: Verbal Verbal Expression Overall Verbal Expression: Impaired Initiation: No impairment Automatic Speech: Counting Level of Generative/Spontaneous Verbalization: Conversation (mild anomia/phonemic paraphasias noted) Repetition: No impairment Naming: No impairment Pragmatics: No impairment Effective Techniques:  (increased time, circumlocution) Written Expression Dominant Hand: Right Written Expression: Not tested   Oral / Motor  Oral Motor/Sensory Function Overall Oral Motor/Sensory Function: Within functional limits Motor Speech Overall Motor Speech: Appears within functional limits for tasks assessed   [place] MA, [place] Pager [phone] 11/08/2015, 12:10 PM

## 2015-11-08 NOTE — Discharge Summary (Signed)
Discharge Summary    Patient ID: Jose Ware,  MRN: [id number], DOB/AGE: [birth year] 81 y.o.  Admit date: 11/04/2015 Discharge date: 11/08/2015  Primary Care Provider: Garret Reddish Primary Cardiologist: New- Dr. Martinique    Discharge Diagnoses    Principal Problem:   Acute ST elevation myocardial infarction (STEMI) involving left circumflex coronary artery [facility]) Active Problems:   ADENOCARCINOMA, PROSTATE   Essential hypertension   PSORIASIS   History of adenomatous polyp of colon   CAD (coronary artery disease)   Ischemic cardiomyopathy   CVA (cerebral infarction)   NSVT (nonsustained ventricular tachycardia) (HCC)   Allergies Allergies  Allergen Reactions  . Bee Venom Swelling    Facial swelling  . Betadine [Povidone Iodine] Other (See Comments)    blistering     History of Present Illness     Jose Ware is a 81 y.o. male with a history of HTN, remote tobacco abuse, psoriasis, prostate cancer s/p XRT and radiation seed therapy, adenomatous colon polyps and diverticulitis s/p previous abdominal surgeries and no prior cardiac history who presented to [facility] on 11/04/15 with acute bilateral arm pain/chest pain and found to have an inferolateral STEMI.   He denied a history of DM, HLD, cardiac hx, bleeding or CVA. He reported multiple abdominal surgeries in the past. He quit smoking in the 1980s. He was in his usual state of good health until 11/04/15 when he developed acute onset of bilateral arm pain after eating brunch at 11 am and EMS was called. On the way to the ED he developed chest pain as well and diaphoresis. Ecg showed > 2 mm ST elevation in the inferior and lateral leads. He was administered ASA, IV heparin, and sl NTG in ED and taken back for emergent cardiac cath with PCI.   Hospital Course     Consultants: neuro  1. Acute inferolateral STEMI: S/p emergent DES of a very large LCx with extensive thrombus. Significant residual disease in the  proximal LAD and distal RCA. S/p staged PCI of LAD and RCA 8/29. Placed on ASA and Brilinta, BB and statin ( even though lipids levels look good: LDL 51 off drug).   2. HTN: currently controlled on losartan and Coreg.  3. NSVT post reperfusion: Resolved.  4. Severe ischemic LV dysfunction: EF 25-30% by LV gram on cath 11/04/15. Appeared out of proportion to infarct although infarct was large. EF improved by Echo and now 40-45%. Continue Coreg, losartan and aldactone.  5. Hypokalemia: Repleted  6. Embolic CVA: on 0000000 he mentioned difficulty with word finding and a MRI showed scattered small acute infarcts in the bilateral anterior and posterior circulation. This was concerning for embolic CVAs most likely due to emboli from the aortic arch during the cardiac cath. Mild Neurologic deficit. Seen by Neuro this admission who felt he did not require long term neuro follow up. He is on aspirin and Brilinta which are suitable for secondary stroke prevention. He can be transitioned to a single antiplatelet therapy for long-term secondary prevention once suitable from a cardiac perspective. He was seen by [facility] who recommended outpatient follow up. I will help arrange this with case management.  7. Carotid artery disease: carotid dopplers done this admission after CVA. PRELIMINARY REPORT with 1-39% stenosis. Per neuro, if found to have >60% stenosis would need to be referred to vascular surgery although this was not felt related to embolic CVA this admission.   The patient has had an uncomplicated hospital  course and is recovering well. The radial catheter site is stable. He has been seen by Dr. Martinique today and deemed ready for discharge home. All follow-up appointments have been scheduled. A written RX for a 30 day free supply of Brilinta was provided for the patient. Discharge medications are listed below.  _____________  Discharge Vitals Blood pressure 117/70, pulse (!)  55, temperature 97.7 F (36.5 C), temperature source Oral, resp. rate 17, height 6\' 1"  (1.854 m), weight 218 lb 14.4 oz (99.3 kg), SpO2 96 %.  Filed Weights   11/06/15 0300 11/07/15 0401 11/08/15 0400  Weight: 219 lb 12.8 oz (99.7 kg) 217 lb 6 oz (98.6 kg) 218 lb 14.4 oz (99.3 kg)    Labs & Radiologic Studies     CBC  Recent Labs  11/07/15 0322 11/08/15 0329  WBC 10.8* 10.7*  HGB 12.4* 12.1*  HCT 38.4* 37.8*  MCV 98.7 97.9  PLT 239 0000000   Basic Metabolic Panel  Recent Labs  11/06/15 0348 11/07/15 0322  NA 140 140  K 3.5 3.4*  CL 103 103  CO2 30 28  GLUCOSE 92 92  BUN 21* 20  CREATININE 0.91 1.03  CALCIUM 8.8* 8.5*   Liver Function Tests No results for input(s): AST, ALT, ALKPHOS, BILITOT, PROT, ALBUMIN in the last 72 hours. No results for input(s): LIPASE, AMYLASE in the last 72 hours. Cardiac Enzymes  Recent Labs  11/06/15 2122 11/07/15 0322 11/07/15 0858  TROPONINI 23.75* 25.64* 19.82*     Dg Lumbar Spine Complete  Result Date: 10/24/2015 CLINICAL DATA:  Chronic low back pain, no known injury EXAM: LUMBAR SPINE - COMPLETE 4+ VIEW COMPARISON:  01/05/2015 FINDINGS: Five views of the lumbar spine submitted. No acute fracture or subluxation. Stable mild compression deformity upper endplate of L1 vertebral body. Stable degenerative changes with disc space flattening and anterior spurring at L2-L3,L3-L4 and L4-L5 level. Significant disc space flattening with vacuum disc phenomenon at L4-L5 level. Atherosclerotic calcifications of abdominal aorta and iliac arteries. Mild lumbar levoscoliosis. IMPRESSION: No acute fracture or subluxation. Mild lumbar levoscoliosis. Stable mild compression deformity upper endplate of L1 vertebral body. Degenerative changes as described above. Electronically Signed   By: Lahoma Crocker M.D.   On: 10/24/2015 13:32   Mr Brain Wo Contrast  Result Date: 11/07/2015 CLINICAL DATA:  81 year old male with word finding difficulty for 2 days. Recent  cardiac intervention. Initial encounter. EXAM: MRI HEAD WITHOUT CONTRAST TECHNIQUE: Multiplanar, multiecho pulse sequences of the brain and surrounding structures were obtained without intravenous contrast. COMPARISON:  Paranasal sinus CT 11/27/2014. FINDINGS: Scattered small, mostly punctate, foci of restricted diffusion in both cerebral and cerebellar hemispheres. There is left MCA vascular territory involvement, most pronounced in the subcortical and central white matter of the left middle frontal gyrus as seen on series 3, image 35. Right cerebellar tonsil involvement also noted. Associated mild T2 hyperintensity in the affected areas with no associated hemorrhage or mass effect. Major intracranial vascular flow voids are preserved, dominant appearing distal left vertebral artery. No midline shift, mass effect, evidence of mass lesion, ventriculomegaly, extra-axial collection or acute intracranial hemorrhage. Cervicomedullary junction and pituitary are within normal limits. Outside of the acute findings gray and white matter signal is normal for age. There is a small 6 mm left lateral ventricle subependymal nodule (series 4, image 17) which does not appear mineralized on T2 * imaging. No similar finding elsewhere. Negative visualized cervical spine. Visible internal auditory structures appear normal. Visualized bone marrow signal is within normal  limits. Visualized paranasal sinuses and mastoids are stable and well pneumatized. Postoperative changes to both globes. Negative scalp soft tissues. IMPRESSION: 1. Scattered small acute infarcts in the bilateral anterior and posterior circulation compatible with sequelae of emboli. No associated hemorrhage or mass effect. 2. No other acute intracranial abnormality. A sub centimeter left lateral ventricle subependymal nodule is nonspecific but significance is doubtful. Electronically Signed   By: Genevie Ann M.D.   On: 11/07/2015 14:16   Dg Chest Port 1 View  Result  Date: Nov 24, 2015 CLINICAL DATA:  Chest pain EXAM: PORTABLE CHEST 1 VIEW COMPARISON:  02/12/2009 FINDINGS: 1627 hours. Lordotic positioning. The lungs are clear wiithout focal pneumonia, edema, pneumothorax or pleural effusion. Cardiopericardial silhouette is at upper limits of normal for size. The visualized bony structures of the thorax are intact. Telemetry leads overlie the chest. IMPRESSION: No active disease. Electronically Signed   By: Misty Stanley M.D.   On: 2015-11-24 17:12     Diagnostic Studies/Procedures    Procedures 2015/11/24 Coronary Stent Intervention  Left Heart Cath and Coronary Angiography  Conclusion     Prox RCA to Mid RCA lesion, 30 %stenosed.  Dist RCA lesion, 75 %stenosed.  Prox LAD lesion, 80 %stenosed.  Dist LAD lesion, 30 %stenosed.  A STENT SYNERGY DES [id number] drug eluting stent was successfully placed.  Mid Cx lesion, 100 %stenosed.  Post intervention, there is a 0% residual stenosis.  There is severe left ventricular systolic dysfunction.  LV end diastolic pressure is moderately elevated.  The left ventricular ejection fraction is 25-35% by visual estimate.   1. Severe 3 vessel obstructive CAD    - 100% occlusion of the mid LCx    - 80% stenosis of the proximal LAD    - 75 % stenosis of the distal RCA 2. Severe LV dysfunction 3. Successful stenting of the mid LCx with DES. Some residual thrombus distally  Plan: DAPT for one year. Continue IV Aggrastat for 18 hours. ARB and beta blocker as tolerated. Would consider staged PCI of the LAD and RCA during this hospitalization.    _____________    2D Echo: 05/08/15 Study Conclusions - Left ventricle: The cavity size was normal. Wall thickness was increased in a pattern of moderate LVH. Systolic function was mildly reduced. The estimated ejection fraction was in the range of 45% to 50%. Inferolateral hypokinesis. Doppler parameters are consistent with abnormal left ventricular  relaxation (grade 1 diastolic dysfunction). The E/e&' ratio is >15, suggesting elevated LV filling pressure. - Aortic valve: Trileaflet. Sclerosis without stenosis. - Mitral valve: Calcified annulus. There was trivial regurgitation. - Left atrium: The atrium was mildly dilated. - Inferior vena cava: The vessel was normal in size. The respirophasic diameter changes were in the normal range (>= 50%), consistent with normal central venous pressure. Impressions: - LVEF 45-50% with moderate LVH and inferolateral hypokinesis, diastolic dysfunction with elevated LV filling pressure, aortic valve sclerosis, trivial MR, mild LAE, normal IVC.  _____________   Procedures 11/06/15 Coronary Stent Intervention  Conclusion     Prox LAD lesion, 80 %stenosed.  A STENT PROMUS PREM MR 3.5X16 drug eluting stent was successfully placed (postdilated to 4.1 mm). Post intervention, there is a 0% residual stenosis.  Dist RCA lesion, 75 %stenosed.  A STENT PROMUS PREM MR 2.5X16 drug eluting stent was successfully placed (postdilated to 2.7 mm). Post intervention, there is a 0% residual stenosis.  Widely patent stents in the circumflex to OM 2. TIMI 3 flow distally. No residual thrombus noted.  Successful 2 vessel Staged PCI for complete revascularization post inferolateral STEMI. Widely patent recently placed stent in the circumflex system.  Plan:  Return to CCU for now for post PCI care. He could be transferred to stepdown or telemetry bed if available.  Continue dual antiplatelet therapy  Continue aggressive cardiac risk factor modification.    _____________    11/07/15: MRI HEAD WITHOUT CONTRAST IMPRESSION: 1. Scattered small acute infarcts in the bilateral anterior and posterior circulation compatible with sequelae of emboli. No associated hemorrhage or mass effect. 2. No other acute intracranial abnormality. A sub centimeter left lateral ventricle subependymal nodule  is nonspecific but significance is doubtful.   Disposition   Pt is being discharged home today in good condition.  Follow-up Plans & Appointments    Follow-up Information    [place], [place]. Go on 11/16/2015.   Specialties:  Cardiology, Radiology Why:  @ 9am to see a pharmacist before your appointment with Kerin Ransom PA-C at 9:30am Contact information: [address] [place] [address] [phone]          Discharge Instructions    AMB Referral to Cardiac Rehabilitation - Phase II    Complete by:  As directed   Diagnosis:  STEMI   Amb Referral to Cardiac Rehabilitation    Complete by:  As directed   Diagnosis:   Coronary Stents STEMI        Discharge Medications     Medication List    STOP taking these medications   ALEVE 220 MG tablet Generic drug:  naproxen sodium   amLODipine 10 MG tablet Commonly known as:  NORVASC   amLODipine 5 MG tablet Commonly known as:  NORVASC   aspirin 325 MG tablet Replaced by:  aspirin 81 MG chewable tablet   hydrochlorothiazide 25 MG tablet Commonly known as:  HYDRODIURIL   potassium chloride 10 MEQ tablet Commonly known as:  K-DUR,KLOR-CON     TAKE these medications   acetaminophen 325 MG tablet Commonly known as:  TYLENOL Take 2 tablets (650 mg total) by mouth every 4 (four) hours as needed for headache or mild pain.   aspirin 81 MG chewable tablet Chew 1 tablet (81 mg total) by mouth daily. Replaces:  aspirin 325 MG tablet   atorvastatin 80 MG tablet Commonly known as:  LIPITOR Take 1 tablet (80 mg total) by mouth daily at 6 PM.   beta carotene w/minerals tablet Take 1 tablet by mouth daily.   carvedilol 6.25 MG tablet Commonly known as:  COREG Take 1 tablet (6.25 mg total) by mouth 2 (two) times daily with a meal.   losartan 100 MG tablet Commonly known as:  COZAAR Take 1 tablet (100 mg total) by mouth daily.   nitroGLYCERIN 0.4 MG SL tablet Commonly known as:  NITROSTAT Place 1  tablet (0.4 mg total) under the tongue every 5 (five) minutes x 3 doses as needed for chest pain.   pantoprazole 40 MG tablet Commonly known as:  PROTONIX Take 1 tablet (40 mg total) by mouth daily.   spironolactone 25 MG tablet Commonly known as:  ALDACTONE Take 1 tablet (25 mg total) by mouth daily.   ticagrelor 90 MG Tabs tablet Commonly known as:  BRILINTA Take 1 tablet (90 mg total) by mouth 2 (two) times daily.   Vitamin B-12 5000 MCG Tbdp Take 5,000 mcg by mouth daily.       Aspirin prescribed at discharge?  YES High Intensity Statin Prescribed? YES Beta Blocker Prescribed?  YES For EF 45% or less, Was ACEI/ARB Prescribed? YES ADP Receptor Inhibitor Prescribed? YES For EF <45%, Aldosterone Inhibitor Prescribed? YES Was EF assessed during THIS hospitalization? YES Was Cardiac Rehab II ordered? (Included Medically managed Patients): YES   Outstanding Labs/Studies   none  Duration of Discharge Encounter   Greater than 30 minutes including physician time.  Signed, Angelena Form PA-C 11/08/2015, 12:39 PM

## 2015-11-08 NOTE — Progress Notes (Signed)
Dc instructions given to pt at this time. Pt verbalizes understanding of all instructions.  No s/s of any acute distress noted.

## 2015-11-08 NOTE — Progress Notes (Signed)
md requested outpt speech therapy. Order placed in computer for [facility]. They will call pt to sched appt. Pt has phone number, address and copy of order if needed.

## 2015-11-08 NOTE — Progress Notes (Signed)
Preliminary results by tech - Carotid Duplex Completed. Bilateral carotid arteries demonstrated a mild amount of plaque without significant stenosis, 1-39% category. Vertebral arteries demonstrated antegrade flow.  Oda Cogan, BS, RDMS, RVT

## 2015-11-08 NOTE — Progress Notes (Signed)
TELEMETRY: Reviewed telemetry pt in NSR.  Vitals:   11/08/15 0700 11/08/15 0755 11/08/15 0800 11/08/15 0809  BP:  117/82    Pulse: 64 62 (!) 59   Resp: 13 16 18    Temp:    98.2 F (36.8 C)  TempSrc:      SpO2: 96% 97% 97%   Weight:      Height:        Intake/Output Summary (Last 24 hours) at 11/08/15 0813 Last data filed at 11/08/15 0800  Gross per 24 hour  Intake              240 ml  Output              950 ml  Net             -710 ml   Filed Weights   11/06/15 0300 11/07/15 0401 11/08/15 0400  Weight: 219 lb 12.8 oz (99.7 kg) 217 lb 6 oz (98.6 kg) 218 lb 14.4 oz (99.3 kg)    Subjective  Feels well this am. Chest pain resolved. No dyspnea. Still occasional difficult getting a word out.   .  stroke: mapping our early stages of recovery book   Does not apply Once  . aspirin  81 mg Oral Daily  . atorvastatin  80 mg Oral q1800  . carvedilol  6.25 mg Oral BID WC  . enoxaparin (LOVENOX) injection  40 mg Subcutaneous Q24H  . losartan  100 mg Oral Daily  . mouth rinse  15 mL Mouth Rinse BID  . multivitamin  1 tablet Oral Daily  . pantoprazole  40 mg Oral Daily  . sodium chloride flush  3 mL Intravenous Q12H  . sodium chloride flush  3 mL Intravenous Q12H  . spironolactone  25 mg Oral Daily  . ticagrelor  90 mg Oral BID  . vitamin B-12  5,000 mcg Oral Daily      LABS: Basic Metabolic Panel:  Recent Labs  11/06/15 0348 11/07/15 0322  NA 140 140  K 3.5 3.4*  CL 103 103  CO2 30 28  GLUCOSE 92 92  BUN 21* 20  CREATININE 0.91 1.03  CALCIUM 8.8* 8.5*   Liver Function Tests: No results for input(s): AST, ALT, ALKPHOS, BILITOT, PROT, ALBUMIN in the last 72 hours. No results for input(s): LIPASE, AMYLASE in the last 72 hours. CBC:  Recent Labs  11/07/15 0322 11/08/15 0329  WBC 10.8* 10.7*  HGB 12.4* 12.1*  HCT 38.4* 37.8*  MCV 98.7 97.9  PLT 239 233   Cardiac Enzymes:  Recent Labs  11/06/15 2122 11/07/15 0322 11/07/15 0858  TROPONINI 23.75*  25.64* 19.82*   BNP: No results for input(s): PROBNP in the last 72 hours. D-Dimer: No results for input(s): DDIMER in the last 72 hours. Hemoglobin A1C: No results for input(s): HGBA1C in the last 72 hours. Fasting Lipid Panel: No results for input(s): CHOL, HDL, LDLCALC, TRIG, CHOLHDL, LDLDIRECT in the last 72 hours. Thyroid Function Tests: No results for input(s): TSH, T4TOTAL, T3FREE, THYROIDAB in the last 72 hours.  Invalid input(s): FREET3   Radiology/Studies:  Mr Herby Abraham Contrast  Result Date: 11/07/2015 CLINICAL DATA:  81 year old male with word finding difficulty for 2 days. Recent cardiac intervention. Initial encounter. EXAM: MRI HEAD WITHOUT CONTRAST TECHNIQUE: Multiplanar, multiecho pulse sequences of the brain and surrounding structures were obtained without intravenous contrast. COMPARISON:  Paranasal sinus CT 11/27/2014. FINDINGS: Scattered small, mostly punctate, foci of restricted diffusion in both cerebral and  cerebellar hemispheres. There is left MCA vascular territory involvement, most pronounced in the subcortical and central white matter of the left middle frontal gyrus as seen on series 3, image 35. Right cerebellar tonsil involvement also noted. Associated mild T2 hyperintensity in the affected areas with no associated hemorrhage or mass effect. Major intracranial vascular flow voids are preserved, dominant appearing distal left vertebral artery. No midline shift, mass effect, evidence of mass lesion, ventriculomegaly, extra-axial collection or acute intracranial hemorrhage. Cervicomedullary junction and pituitary are within normal limits. Outside of the acute findings gray and white matter signal is normal for age. There is a small 6 mm left lateral ventricle subependymal nodule (series 4, image 17) which does not appear mineralized on T2 * imaging. No similar finding elsewhere. Negative visualized cervical spine. Visible internal auditory structures appear normal.  Visualized bone marrow signal is within normal limits. Visualized paranasal sinuses and mastoids are stable and well pneumatized. Postoperative changes to both globes. Negative scalp soft tissues. IMPRESSION: 1. Scattered small acute infarcts in the bilateral anterior and posterior circulation compatible with sequelae of emboli. No associated hemorrhage or mass effect. 2. No other acute intracranial abnormality. A sub centimeter left lateral ventricle subependymal nodule is nonspecific but significance is doubtful. Electronically Signed   By: Genevie Ann M.D.   On: 11/07/2015 14:16   Ecg today shows NSR with LAD, ST elevation resolved. Recent inferoposterior infarct.  I have personally reviewed and interpreted this study.  Echo: Study Conclusions  - Left ventricle: The cavity size was normal. Wall thickness was   increased in a pattern of moderate LVH. Systolic function was   mildly reduced. The estimated ejection fraction was in the range   of 45% to 50%. Inferolateral hypokinesis. Doppler parameters are   consistent with abnormal left ventricular relaxation (grade 1   diastolic dysfunction). The E/e&' ratio is >15, suggesting   elevated LV filling pressure. - Aortic valve: Trileaflet. Sclerosis without stenosis. - Mitral valve: Calcified annulus. There was trivial regurgitation. - Left atrium: The atrium was mildly dilated. - Inferior vena cava: The vessel was normal in size. The   respirophasic diameter changes were in the normal range (>= 50%),   consistent with normal central venous pressure.  Impressions:  - LVEF 45-50% with moderate LVH and inferolateral hypokinesis,   diastolic dysfunction with elevated LV filling pressure, aortic   valve sclerosis, trivial MR, mild LAE, normal IVC.  CLINICAL DATA:  81 year old male with word finding difficulty for 2 days. Recent cardiac intervention. Initial encounter.  EXAM: MRI HEAD WITHOUT CONTRAST  TECHNIQUE: Multiplanar, multiecho  pulse sequences of the brain and surrounding structures were obtained without intravenous contrast.  COMPARISON:  Paranasal sinus CT 11/27/2014.  FINDINGS: Scattered small, mostly punctate, foci of restricted diffusion in both cerebral and cerebellar hemispheres. There is left MCA vascular territory involvement, most pronounced in the subcortical and central white matter of the left middle frontal gyrus as seen on series 3, image 35. Right cerebellar tonsil involvement also noted. Associated mild T2 hyperintensity in the affected areas with no associated hemorrhage or mass effect.  Major intracranial vascular flow voids are preserved, dominant appearing distal left vertebral artery.  No midline shift, mass effect, evidence of mass lesion, ventriculomegaly, extra-axial collection or acute intracranial hemorrhage. Cervicomedullary junction and pituitary are within normal limits.  Outside of the acute findings gray and white matter signal is normal for age. There is a small 6 mm left lateral ventricle subependymal nodule (series 4, image 17) which does  not appear mineralized on T2 * imaging. No similar finding elsewhere.  Negative visualized cervical spine. Visible internal auditory structures appear normal. Visualized bone marrow signal is within normal limits. Visualized paranasal sinuses and mastoids are stable and well pneumatized. Postoperative changes to both globes. Negative scalp soft tissues.  IMPRESSION: 1. Scattered small acute infarcts in the bilateral anterior and posterior circulation compatible with sequelae of emboli. No associated hemorrhage or mass effect. 2. No other acute intracranial abnormality. A sub centimeter left lateral ventricle subependymal nodule is nonspecific but significance is doubtful.   Electronically Signed   By: Genevie Ann M.D.   On: 11/07/2015 14:16  PHYSICAL EXAM General: Well developed, well nourished, in no acute  distress. Head: Normocephalic, atraumatic, sclera non-icteric, oropharynx is clear Neck: Negative for carotid bruits. JVD not elevated. No adenopathy Lungs: Clear bilaterally to auscultation without wheezes, rales, or rhonchi. Breathing is unlabored. Heart: RRR S1 S2 without murmurs, rubs, or gallops.  Abdomen: Soft, non-tender, non-distended with normoactive bowel sounds. No hepatomegaly. No rebound/guarding. No obvious abdominal masses. Msk:  Strength and tone appears normal for age. Extremities: No clubbing, cyanosis or edema.  Distal pedal pulses are 2+ and equal bilaterally. Neuro: Alert and oriented X 3. Moves all extremities spontaneously. Psych:  Responds to questions appropriately with a normal affect.  ASSESSMENT AND PLAN: 1. Acute inferolateral STEMI. S/p emergent DES of a very large LCx with extensive thrombus. Significant residual disease in the proximal LAD and distal RCA. S/p staged PCI of LAD and RCA 8/29. On ASA and Brilinta.   On beta blocker and statin ( even though lipids levels look good).  2. HTN currently controlled on losartan and Coreg. 3. NSVT post reperfusion. Resolved. 4. Severe ischemic LV dysfunction. Appeared out of proportion to infarct although infarct was large. EF improved by Echo and now 40-45%. Continue Coreg, losartan and aldactone. 5. Hypokalemia. Repleted 6. Embolic CVA most likely related to staged PCI procedure. Mild Neurologic deficit. Appreciate Neuro input. Will check carotid dopplers today. On DAPT. Risk factor modification. SLP evaluation today.   [place] for discharge pending Neuro clearance.    Present on Admission: . Acute ST elevation myocardial infarction (STEMI) involving left circumflex coronary artery ([place]) . Essential hypertension   Signed, Peter Martinique, [place] 11/08/2015 8:13 AM

## 2015-11-09 ENCOUNTER — Telehealth: Payer: Self-pay | Admitting: Cardiology

## 2015-11-09 NOTE — Telephone Encounter (Signed)
TOC phone call .Marland Kitchen appt is on 11/16/15 at [place] w/ Pharmacist and 9:30am w/ Kerin Ransom at [facility]

## 2015-11-09 NOTE — Telephone Encounter (Signed)
Patient contacted regarding discharge from [facility] on 11/08/15.    Patient understands to follow up with provider Kerin Ransom, PA-C on 9/8 at 9:30 at [facility] location as well as follow up with pharmacist on 9/8 @ 9:00 at [facility] location.  Patient understands discharge instructions? yes  Patient understands medications and regiment? yes  Patient understands to bring all medications to this visit? yes   Pt verbalized he is doing great and has no questions or concerns at this time.

## 2015-11-16 ENCOUNTER — Ambulatory Visit (INDEPENDENT_AMBULATORY_CARE_PROVIDER_SITE_OTHER): Payer: Medicare Other | Admitting: Cardiology

## 2015-11-16 ENCOUNTER — Encounter: Payer: Self-pay | Admitting: Cardiology

## 2015-11-16 ENCOUNTER — Telehealth: Payer: Self-pay | Admitting: Cardiology

## 2015-11-16 ENCOUNTER — Ambulatory Visit (INDEPENDENT_AMBULATORY_CARE_PROVIDER_SITE_OTHER): Payer: Medicare Other | Admitting: Pharmacist Clinician (PhC)/ Clinical Pharmacy Specialist

## 2015-11-16 VITALS — BP 108/60 | HR 52 | Ht 73.0 in | Wt 219.2 lb

## 2015-11-16 DIAGNOSIS — Z9861 Coronary angioplasty status: Secondary | ICD-10-CM

## 2015-11-16 DIAGNOSIS — I2121 ST elevation (STEMI) myocardial infarction involving left circumflex coronary artery: Secondary | ICD-10-CM

## 2015-11-16 DIAGNOSIS — E876 Hypokalemia: Secondary | ICD-10-CM | POA: Diagnosis not present

## 2015-11-16 DIAGNOSIS — I63133 Cerebral infarction due to embolism of bilateral carotid arteries: Secondary | ICD-10-CM

## 2015-11-16 DIAGNOSIS — I255 Ischemic cardiomyopathy: Secondary | ICD-10-CM

## 2015-11-16 DIAGNOSIS — I10 Essential (primary) hypertension: Secondary | ICD-10-CM

## 2015-11-16 DIAGNOSIS — E785 Hyperlipidemia, unspecified: Secondary | ICD-10-CM

## 2015-11-16 DIAGNOSIS — I2510 Atherosclerotic heart disease of native coronary artery without angina pectoris: Secondary | ICD-10-CM

## 2015-11-16 LAB — BASIC METABOLIC PANEL
BUN: 22 mg/dL (ref 7–25)
CO2: 27 mmol/L (ref 20–31)
Calcium: 9.1 mg/dL (ref 8.6–10.3)
Chloride: 102 mmol/L (ref 98–110)
Creat: 1.22 mg/dL — ABNORMAL HIGH (ref 0.70–1.18)
Glucose, Bld: 84 mg/dL (ref 65–99)
Potassium: 4.8 mmol/L (ref 3.5–5.3)
Sodium: 137 mmol/L (ref 135–146)

## 2015-11-16 MED ORDER — ASPIRIN EC 81 MG PO TBEC: 81.0000 mg | DELAYED_RELEASE_TABLET | Freq: Every day | ORAL | 1 refills | Status: AC

## 2015-11-16 MED ORDER — LOSARTAN POTASSIUM 25 MG PO TABS: 25.0000 mg | ORAL_TABLET | Freq: Every day | ORAL | 3 refills | Status: DC

## 2015-11-16 NOTE — Telephone Encounter (Signed)
New message    Pt wife verbalized that he is calling to speak to rn   They have questions on medications  Pt bp medication he has not taken for a whole week Cozaar  Question: Do pt needs to take it right away since they noticed he misses dosages?

## 2015-11-16 NOTE — Telephone Encounter (Signed)
Returned call to wife (ok per DPR)-reports they met with pharmacist this AM and went over medications but they got confused with the losartan.  When they got home they realized pt has not been taking losartan since D/C (8/31).    Advised per D/C instructions pt was to continue taking '100mg'$  Losartan once daily.    Wife request to verify with pharmacist whether pt should start this medication back or not.  Verbalized I would route to pharmacist to verify.

## 2015-11-16 NOTE — Assessment & Plan Note (Signed)
11/04/15:  Acute inferolateral STEMI: S/p emergent DES of a very large LCx with extensive thrombus. Significant residual disease in the proximal LAD and distal RCA. S/p staged PCI of LAD and RCA 8/29.

## 2015-11-16 NOTE — Assessment & Plan Note (Signed)
LDL 51, HDL 29- 11/05/15 On Lipitor 80 mg

## 2015-11-16 NOTE — Assessment & Plan Note (Signed)
Controlled.  

## 2015-11-16 NOTE — Progress Notes (Signed)
11/16/2015 Jose Ware   06-09-1934  [id number]  Primary Physician Garret Reddish, MD Primary Cardiologist: Dr Martinique  HPI:  81 y.o. Caucasian male with a history of HTN, remote tobacco abuse, psoriasis, prostate cancer s/p XRT and radiation seed therapy, adenomatous colon polyps and diverticulitis s/p previous abdominal surgeries, but no prior cardiac history, presented to [facility] on 11/04/15 as a STEMI. He was in his usual state of good health until 11/04/15 when he developed acute onset of bilateral arm pain after eating brunch at 11 am and EMS was called. On the way to the ED he developed chest pain as well and diaphoresis. Ecg showed >2 mm ST elevation in the inferior and lateral leads. He was administered ASA, IV heparin, and sl NTG in ED and taken back for emergent cath. This revealed a very large LCx with extensive thrombus. He underwent PCI with DES. With reperfusion he had NSVT. The pt also had residual RCA and LAD disease and an EF of 25% at cath. Echo done 11/05/15 showed an EF of 40-45%. His Troponin peaked at 25. On 11/06/15 he was taken back to the lab for staged PCI to his LAD and RCA. This was successful but on 11/07/15 the pt related that he developed some word finding issues that started post PCI 8/29. MRI revealed scattered small acute infarcts in the bilateral anterior and posterior circulation. Neuro consult was obtained. CA dopplers revealed mild plaque (1-39%). The pt's symptoms were improving and no further therapy was recommended.            He is in the office today for a TOC follow up. He has been doing well, he denies any chest pain or SOB. His wife (a retired Therapist, sports) had several questions about his diet and activity and I discussed these issues with them in detail. His speech issues continue to improve but have not completely resolved.    Current Outpatient Prescriptions  Medication Sig Dispense Refill  . acetaminophen (TYLENOL) 325 MG tablet Take 2 tablets (650 mg total) by  mouth every 4 (four) hours as needed for headache or mild pain.    Marland Kitchen atorvastatin (LIPITOR) 80 MG tablet Take 1 tablet (80 mg total) by mouth daily at 6 PM. 90 tablet 3  . beta carotene w/minerals (OCUVITE) tablet Take 1 tablet by mouth daily.    . carvedilol (COREG) 6.25 MG tablet Take 1 tablet (6.25 mg total) by mouth 2 (two) times daily with a meal. 180 tablet 3  . Cyanocobalamin (VITAMIN B-12) 5000 MCG TBDP Take 5,000 mcg by mouth daily.    Marland Kitchen losartan (COZAAR) 100 MG tablet Take 1 tablet (100 mg total) by mouth daily. 90 tablet 3  . nitroGLYCERIN (NITROSTAT) 0.4 MG SL tablet Place 1 tablet (0.4 mg total) under the tongue every 5 (five) minutes x 3 doses as needed for chest pain. 25 tablet 2  . pantoprazole (PROTONIX) 40 MG tablet Take 1 tablet (40 mg total) by mouth daily. 30 tablet 3  . spironolactone (ALDACTONE) 25 MG tablet Take 1 tablet (25 mg total) by mouth daily. 90 tablet 3  . ticagrelor (BRILINTA) 90 MG TABS tablet Take 1 tablet (90 mg total) by mouth 2 (two) times daily. 180 tablet 3  . aspirin EC 81 MG tablet Take 1 tablet (81 mg total) by mouth daily. 90 tablet 1   No current facility-administered medications for this visit.     Allergies  Allergen Reactions  . Bee Venom Swelling  Facial swelling  . Betadine [Povidone Iodine] Other (See Comments)    blistering    Social History   Social History  . Marital status: Married    Spouse name: N/A  . Number of children: 3  . Years of education: N/A   Occupational History  . Not on file.   Social History Main Topics  . Smoking status: Former Smoker    Quit date: 03/11/1983  . Smokeless tobacco: Never Used  . Alcohol use 0.0 oz/week     Comment: RARE  . Drug use: No  . Sexual activity: Not on file   Other Topics Concern  . Not on file   Social History Narrative   [employer] [place], [place].   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  Cardiovascular: negative for chest pain, dyspnea on exertion, edema, orthopnea, palpitations, paroxysmal nocturnal dyspnea or shortness of breath Dermatological: negative for rash Respiratory: negative for cough or wheezing Urologic: negative for hematuria Abdominal: negative for nausea, vomiting, diarrhea, bright red blood per rectum, melena, or hematemesis Neurologic: negative for visual changes, syncope, or dizziness All other systems reviewed and are otherwise negative except as noted above.    Blood pressure 108/60, pulse (!) 52, height 6\' 1"  (1.854 m), weight 219 lb 4 oz (99.5 kg).  General appearance: alert, cooperative and no distress Neck: no carotid bruit and no JVD Lungs: clear to auscultation bilaterally Heart: regular rate and rhythm Extremities: extremities normal, atraumatic, no cyanosis or edema Skin: Skin color, texture, turgor normal. No rashes or lesions Neurologic: Grossly normal  EKG NSR, PVCs, inferior / posterior infarct  ASSESSMENT AND PLAN:   Acute ST elevation myocardial infarction (STEMI) involving left circumflex coronary artery ([place]) Presented 11/04/15  CAD S/P PCI 11/04/15:  Acute inferolateral STEMI: S/p emergent DES of a very large LCx with extensive thrombus. Significant residual disease in the proximal LAD and distal RCA. S/p staged PCI of LAD and RCA 8/29.  Embolic stroke ([place]) 123456: embolic CVA after staged PCI 11/06/15  Dyslipidemia LDL 51, HDL 29- 11/05/15 On Lipitor 80 mg  Essential hypertension Controlled  Ischemic cardiomyopathy EF 25-30% by LV gram on cath 11/04/15. Appeared out of proportion to infarct  although infarct was large. EF improved by Echo 11/05/15- 45-50%   PLAN  Continue high dose statin Rx. Check BMP (K+ low at discharge). Start cardiac rehab in two weeks, f/u Dr Martinique in 3 months.   Kerin Ransom PA-C 11/16/2015 10:04 AM

## 2015-11-16 NOTE — Assessment & Plan Note (Signed)
Presented 11/04/15

## 2015-11-16 NOTE — Patient Instructions (Signed)
Medication Instructions:  Your physician recommends that you continue on your current medications as directed. Please refer to the Current Medication list given to you today.   Labwork: Your physician recommends that you lab work today: bmp   Testing/Procedures: -None  Follow-Up: Your physician recommends that you keep your scheduled  follow-up appointment with Dr. Neita Garnet in December.   Any Other Special Instructions Will Be Listed Below (If Applicable).  You have been referred to Cardiac Rehab.  You should be hearing from that office in a week to set up.    If you need a refill on your cardiac medications before your next appointment, please call your pharmacy.

## 2015-11-16 NOTE — Assessment & Plan Note (Signed)
EF 25-30% by LV gram on cath 11/04/15. Appeared out of proportion to infarct although infarct was large. EF improved by Echo 11/05/15- 45-50%

## 2015-11-16 NOTE — Progress Notes (Signed)
11/16/2015 Jose Ware Feb 02, 1935 [id number]   HPI:  Jose Ware is a 81 y.o. male patient of Dr Martinique, who was hospitalized from August 27 to August 31 with a primary diagnosis of STEMI involving left circumflex coronary artery.    SCr 1.03  CrCl 81.7  LDL 51    Medication  Dose Contra-indication  Antiplatelet Brilinta 90 mg   Aspirin EC  81 mg   Beta-blocker carvedilol 6.25 mg   ACEI/ARB losartan 100 mg   High intensity statin atorvastatin 80 mg (LDL 51 at time of event)  Nitroglycerin nitrostat 0.4 mg    Issues/Concerns:  Patient was told to take chewable 81 mg aspirin per discharge orders.  Will have him switch to enteric coated.    Current Outpatient Prescriptions  Medication Sig Dispense Refill  . acetaminophen (TYLENOL) 325 MG tablet Take 2 tablets (650 mg total) by mouth every 4 (four) hours as needed for headache or mild pain.    Marland Kitchen aspirin 81 MG chewable tablet Chew 1 tablet (81 mg total) by mouth daily.    Marland Kitchen atorvastatin (LIPITOR) 80 MG tablet Take 1 tablet (80 mg total) by mouth daily at 6 PM. 90 tablet 3  . beta carotene w/minerals (OCUVITE) tablet Take 1 tablet by mouth daily.    . carvedilol (COREG) 6.25 MG tablet Take 1 tablet (6.25 mg total) by mouth 2 (two) times daily with a meal. 180 tablet 3  . Cyanocobalamin (VITAMIN B-12) 5000 MCG TBDP Take 5,000 mcg by mouth daily.    Marland Kitchen losartan (COZAAR) 100 MG tablet Take 1 tablet (100 mg total) by mouth daily. 90 tablet 3  . nitroGLYCERIN (NITROSTAT) 0.4 MG SL tablet Place 1 tablet (0.4 mg total) under the tongue every 5 (five) minutes x 3 doses as needed for chest pain. 25 tablet 2  . pantoprazole (PROTONIX) 40 MG tablet Take 1 tablet (40 mg total) by mouth daily. 30 tablet 3  . spironolactone (ALDACTONE) 25 MG tablet Take 1 tablet (25 mg total) by mouth daily. 90 tablet 3  . ticagrelor (BRILINTA) 90 MG TABS tablet Take 1 tablet (90 mg total) by mouth 2 (two) times daily. 180 tablet 3   No current  facility-administered medications for this visit.     Allergies  Allergen Reactions  . Bee Venom Swelling    Facial swelling  . Betadine [Povidone Iodine] Other (See Comments)    blistering    Past Medical History:  Diagnosis Date  . Adenocarcinoma of prostate [facility])    XRT & Radiation seed implantation in 2010  . Adenomatous colon polyp   . CAD (coronary artery disease)    a. 11/04/15:  Acute inferolateral STEMI: S/p emergent DES of a very large LCx with extensive thrombus. Significant residual disease in the proximal LAD and distal RCA. S/p staged PCI of LAD and RCA 8/29.  Marland Kitchen CVA (cerebral infarction)    a. 123456: embolic CVA after heart cath   . CVA (cerebral infarction)    a. 123456: embolic CVA after heart cath   . DIVERTICULITIS, HX OF 11/27/2006       . DJD (degenerative joint disease)    wrist - R   . Hernia   . History of blood transfusion 1985   with colon surgery  . Hypertension   . Ischemic cardiomyopathy    a. EF 25-30% by LV gram on cath 11/04/15. Appeared out of proportion to infarct although infarct was large. EF improved by Echo and now 40-45%.  Marland Kitchen  NEPHROLITHIASIS, HX OF 11/27/2006        . Peripheral neuropathy ([place])   . Psoriasis   . Tenosynovitis     All medications have been reviewed with the patient.  He will switch aspirin from chewable to enteric coated.  He will also discontinue the pantoprazole as he states no problems in the past with acid reflux.  Would recommend repeat BMET today, as patient reports chronically low potassium levels.  His potassium supplement was discontinued in hospital, most likely when spironolactone started.  Thank you,   Tommy Medal PharmD CPP [facility]

## 2015-11-16 NOTE — Assessment & Plan Note (Signed)
123456: embolic CVA after staged PCI 11/06/15

## 2015-11-16 NOTE — Telephone Encounter (Signed)
Spoke with patient wife.  Reports they have bottle of 100 mg losartan tablets, but have not taken any, since patient forgot to add to his medicine list at home.  Since his BP in the office today was only 108/60 will switch him from losartan 100 mg daily to 25 mg daily.  Explained change to wife, who understands to get new strength from pharmacy and start it as soon as they can.

## 2015-11-16 NOTE — Patient Instructions (Addendum)
Medications for a Healthy Heart        My medicines help me. Live longer Feel better Stay out of the hospital      My prescription  Beta blocker    carvedilol  Beta blockers make your heart strong by blocking chemicals that make your heart work too hard. They may cause a slow heartbeat and low blood pressure.   ACE inhibitor/ARB    losartan  ACE inhibitors or ARBs will help to keep your blood pressure lower and to reduce strain on the heart. Call your doctor if you experience a dry cough or have lip/face tingling or swelling.  Statin     atorvastatin  Statins will help to lower your cholesterol and prevent the build-up of plaque in your arteries that can lead to another heart attack and cause heart disease. Let your doctor know if you feel any muscle aches or pains.  Aspirin     Enteric coated 81 mg  Aspirin helps to prevent future heart attacks and heart disease. Because it is an anti-platelet drug, it makes the blood thinner and may cause bleeds. Let your doctor know if it looks like there is blood in your stool or if you have any upcoming procedures or surgeries.  Anti-platelet      Brilinta   You may be on another anti-platelet drug for at least a year after your heart attack. It works with aspirin to keep your blood thin and prevent future heart attacks. It may also cause bleeding-let your doctor know if  it looks like you have blood in your stool or if you have any upcoming procedures or surgeries.  Nitroglycerin    Nitrostat  Nitroglycerin should only be used as needed if you experience chest pain at home. Call the hospital if you need to take more than 1 nitroglycerin. It may cause dizziness and headache.    And remember.  ? Cardiac rehab, exercise, and quitting smoking are some of the best ways to make your heart stronger and help you to live longer. ? Call us if you are interested in counseling or medications to help you quit smoking.

## 2015-12-13 ENCOUNTER — Telehealth (HOSPITAL_COMMUNITY): Payer: Self-pay | Admitting: *Deleted

## 2015-12-13 ENCOUNTER — Encounter (HOSPITAL_COMMUNITY)
Admission: RE | Admit: 2015-12-13 | Discharge: 2015-12-13 | Disposition: A | Discharge status: Home / Self Care | Payer: Medicare Other | Source: Ambulatory Visit | Attending: Cardiology | Admitting: Cardiology

## 2015-12-13 VITALS — BP 108/60 | HR 54 | Ht 72.25 in | Wt 215.4 lb

## 2015-12-13 DIAGNOSIS — I2121 ST elevation (STEMI) myocardial infarction involving left circumflex coronary artery: Secondary | ICD-10-CM

## 2015-12-13 DIAGNOSIS — I213 ST elevation (STEMI) myocardial infarction of unspecified site: Secondary | ICD-10-CM | POA: Insufficient documentation

## 2015-12-13 DIAGNOSIS — Z955 Presence of coronary angioplasty implant and graft: Secondary | ICD-10-CM | POA: Diagnosis not present

## 2015-12-13 NOTE — Progress Notes (Signed)
Pt in this morning for cardiac rehab orientation.  As a part of the orientation appt pt completed 6 minute walk test while on telemetry.  Placed pt on telemetry monitor showed SB with t wave inversion.  Reviewed previous 12 lead ekg from 8/29.  Pt did not have an EKG completed during his follow up appt from [facility] on 8/27, stent 8/29. No T wave inversion noted.  Pt asymptomatic with bp 106/70. Paged [facility] for [employer] [facility].  Advised of pt status.  Instructed to call [facility] pager number provided.  Received immediate return call, update provided of Sinus brady and t wave inversion.  Pt takes correg 6.25 mg twice a day. Advised ok to proceed with walk test, continue to monitor pt.  Pt completed walk test with no complaints.  Hr increased to low 60's with activity. Pt tolerated well.  Advised pt and pt wife who is retired [employer] scientist of the plan. Instructed to continue to monitor his heart rate.  Pt and wife advised to contact MD office for any new symptoms of fatigue, dizziness, lethargy and not feeling "right and for HR less than 45. Pt and wife verbalized understanding. Cherre Huger, BSN

## 2015-12-13 NOTE — Progress Notes (Signed)
Cardiac Rehab Medication Review by a Pharmacist  Does the patient  feel that his/her medications are working for him/her?  yes  Has the patient been experiencing any side effects to the medications prescribed?  no  Does the patient measure his/her own blood pressure or blood glucose at home?  yes   Does the patient have any problems obtaining medications due to transportation or finances?   no  Understanding of regimen: excellent Understanding of indications: excellent Potential of compliance: excellent  Pharmacist comments: Jose Ware is a 81 yo male who presented to cardiac rehab today with his wife in good spirits. His wife is a retired [employer] scientist, and has a very good understanding of her husband's medications and helps his with his regimen. He is not having any medication-related side effects at this time and stated that his blood pressure has been good.   Jose Ware, PharmD Acute Care Pharmacy Resident  Pager: [phone] 12/13/2015

## 2015-12-13 NOTE — Telephone Encounter (Signed)
-----   Message from Peter M Martinique, MD sent at 12/13/2015  1:59 PM EDT ----- HR below 50 at rest. If you note this again will need to consider reduction of Coreg dose  Peter Martinique MD, [facility]    ----- Message ----- From: Rowe Pavy, RN Sent: 12/13/2015   1:26 PM To: Peter M Martinique, MD  Thanks Dr. Martinique I appreciate your response.  I did notice when the pt was a complete rest HR drifted mid 40's. Again pt is asymptomatic and reports his normal heart rate is in the 50's and correg 6.25 bid was added post stemi event.  Parameters for notify you  for resting HR/asymptomatic?  Thanks Maurice Small RN ----- Message ----- From: Peter M Martinique, MD Sent: 12/13/2015   1:14 PM To: Rowe Pavy, RN  Not sure we can make much of T wave inversion on a telemetry lead. Unless symptomatic I would continue Rehab.  Peter Martinique MD, [facility]  ----- Message ----- From: Rowe Pavy, RN Sent: 12/13/2015  11:24 AM To: Peter M Martinique, MD  Please review cardiac rehab note regarding T wave inversion and sinus brady.. Thank you  Maurice Small RN, BSN

## 2015-12-14 ENCOUNTER — Encounter (HOSPITAL_COMMUNITY): Payer: Self-pay

## 2015-12-14 NOTE — Progress Notes (Signed)
Cardiac Individual Treatment Plan  Patient Details  Name: Jose Ware MRN: [id number] Date of Birth: [birth year] Referring Provider:   Flowsheet Row CARDIAC REHAB PHASE II ORIENTATION from 12/13/2015 in [place]  Referring Provider  Martinique, Peter MD      Initial Encounter Date:  [place] PHASE II ORIENTATION from 12/13/2015 in [place]  Date  12/13/15  Referring Provider  Martinique, Peter MD      Visit Diagnosis: 11/04/15 ST elevation myocardial infarction involving left circumflex coronary artery ([place])  11/06/15 Status post coronary artery stent placement - Plan: Amb Referral to Cardiac Rehabilitation  Patient's Home Medications on Admission:  Current Outpatient Prescriptions:  .  acetaminophen (TYLENOL) 325 MG tablet, Take 2 tablets (650 mg total) by mouth every 4 (four) hours as needed for headache or mild pain. (Patient taking differently: Take 325 mg by mouth every 4 (four) hours as needed for headache or mild pain. ), Disp: , Rfl:  .  aspirin EC 81 MG tablet, Take 1 tablet (81 mg total) by mouth daily., Disp: 90 tablet, Rfl: 1 .  atorvastatin (LIPITOR) 80 MG tablet, Take 1 tablet (80 mg total) by mouth daily at 6 PM., Disp: 90 tablet, Rfl: 3 .  beta carotene w/minerals (OCUVITE) tablet, Take 1 tablet by mouth daily., Disp: , Rfl:  .  carvedilol (COREG) 6.25 MG tablet, Take 1 tablet (6.25 mg total) by mouth 2 (two) times daily with a meal., Disp: 180 tablet, Rfl: 3 .  Cyanocobalamin (VITAMIN B-12) 5000 MCG TBDP, Take 5,000 mcg by mouth daily., Disp: , Rfl:  .  losartan (COZAAR) 25 MG tablet, Take 1 tablet (25 mg total) by mouth daily., Disp: 30 tablet, Rfl: 3 .  nitroGLYCERIN (NITROSTAT) 0.4 MG SL tablet, Place 1 tablet (0.4 mg total) under the tongue every 5 (five) minutes x 3 doses as needed for chest pain., Disp: 25 tablet, Rfl: 2 .  pantoprazole (PROTONIX) 40 MG tablet, Take 1 tablet (40 mg  total) by mouth daily., Disp: 30 tablet, Rfl: 3 .  spironolactone (ALDACTONE) 25 MG tablet, Take 1 tablet (25 mg total) by mouth daily., Disp: 90 tablet, Rfl: 3 .  ticagrelor (BRILINTA) 90 MG TABS tablet, Take 1 tablet (90 mg total) by mouth 2 (two) times daily., Disp: 180 tablet, Rfl: 3  Past Medical History: Past Medical History:  Diagnosis Date  . Adenocarcinoma of prostate [facility])    XRT & Radiation seed implantation in 2010  . Adenomatous colon polyp   . CAD (coronary artery disease)    a. 11/04/15:  Acute inferolateral STEMI: S/p emergent DES of a very large LCx with extensive thrombus. Significant residual disease in the proximal LAD and distal RCA. S/p staged PCI of LAD and RCA 8/29.  [place] CVA (cerebral infarction)    a. 123456: embolic CVA after heart cath   . CVA (cerebral infarction)    a. 123456: embolic CVA after heart cath   . DIVERTICULITIS, HX OF 11/27/2006       . DJD (degenerative joint disease)    wrist - R   . Hernia   . History of blood transfusion 1985   with colon surgery  . Hypertension   . Ischemic cardiomyopathy    a. EF 25-30% by LV gram on cath 11/04/15. Appeared out of proportion to infarct although infarct was large. EF improved by Echo and now 40-45%.  . NEPHROLITHIASIS, HX OF 11/27/2006        .  Peripheral neuropathy ([place])   . Psoriasis   . Tenosynovitis     Tobacco Use: History  Smoking Status  . Former Smoker  . Quit date: 03/11/1983  Smokeless Tobacco  . Never Used    Labs: Recent Review Flowsheet Data    Labs for ITP Cardiac and Pulmonary Rehab Latest Ref Rng & Units 12/27/2009 12/01/2013 12/15/2014 11/04/2015 11/05/2015   Cholestrol 0 - 200 mg/dL 131 - 104 - 98   LDLCALC 0 - 99 mg/dL 83 - 59 - 51   LDLDIRECT mg/dL - 68.1 - - -   HDL >40 mg/dL 33.70(L) - 32.00(L) - 29(L)   Trlycerides <150 mg/dL 72.0 - 64.0 - 88   Hemoglobin A1c 4.8 - 5.6 % - - - 5.3 -      Capillary Blood Glucose: No results found for: GLUCAP   Exercise Target  Goals: Date: 12/13/15  Exercise Program Goal: Individual exercise prescription set with THRR, safety & activity barriers. Participant demonstrates ability to understand and report RPE using BORG scale, to self-measure pulse accurately, and to acknowledge the importance of the exercise prescription.  Exercise Prescription Goal: Starting with aerobic activity 30 plus minutes a day, 3 days per week for initial exercise prescription. Provide home exercise prescription and guidelines that participant acknowledges understanding prior to discharge.  Activity Barriers & Risk Stratification:     Activity Barriers & Cardiac Risk Stratification - 12/13/15 0941      Activity Barriers & Cardiac Risk Stratification   Activity Barriers Balance Concerns;Muscular Weakness;Deconditioning  R wrist extension limitation/restrictions   Cardiac Risk Stratification High      6 Minute Walk:     6 Minute Walk    Row Name 12/13/15 1339         6 Minute Walk   Phase Initial     Distance 1177 feet     Walk Time 6 minutes     # of Rest Breaks 0     MPH 2.2     METS 1.73     RPE 9     VO2 Peak 6.08     Symptoms No     Resting HR 54 bpm     Resting BP 108/60     Max Ex. HR 72 bpm     Max Ex. BP 112/60     2 Minute Post BP 114/60        Initial Exercise Prescription:     Initial Exercise Prescription - 12/13/15 1300      Date of Initial Exercise RX and Referring Provider   Date 12/13/15   Referring Provider Martinique, Peter MD     Recumbant Bike   Level 1   Minutes 10   METs 1.3     NuStep   Level 2   Minutes 10   METs 2     Track   Laps 8   Minutes 10   METs 2.39     Prescription Details   Frequency (times per week) 3   Duration Progress to 30 minutes of continuous aerobic without signs/symptoms of physical distress     Intensity   THRR 40-80% of Max Heartrate 56-113   Ratings of Perceived Exertion 11-13   Perceived Dyspnea 0-4     Progression   Progression Continue to  progress workloads to maintain intensity without signs/symptoms of physical distress.     Resistance Training   Training Prescription Yes   Weight 1lb   Reps 10-12      Perform  Capillary Blood Glucose checks as needed.  Exercise Prescription Changes:   Exercise Comments:   Discharge Exercise Prescription (Final Exercise Prescription Changes):   Nutrition:  Target Goals: Understanding of nutrition guidelines, daily intake of sodium 1500mg , cholesterol 200mg , calories 30% from fat and 7% or less from saturated fats, daily to have 5 or more servings of fruits and vegetables.  Biometrics:     Pre Biometrics - 12/13/15 1336      Pre Biometrics   Waist Circumference 43 inches   Hip Circumference 42.5 inches   Waist to Hip Ratio 1.01 %   Triceps Skinfold 20 mm   % Body Fat 31 %   Grip Strength 33 kg   Flexibility 12 in   Single Leg Stand 0.87 seconds       Nutrition Therapy Plan and Nutrition Goals:     Nutrition Therapy & Goals - 12/13/15 1049      Nutrition Therapy   Diet Therapeutic Lifestyle Changes     Intervention Plan   Intervention Prescribe, educate and counsel regarding individualized specific dietary modifications aiming towards targeted core components such as weight, hypertension, lipid management, diabetes, heart failure and other comorbidities.   Expected Outcomes Short Term Goal: Understand basic principles of dietary content, such as calories, fat, sodium, cholesterol and nutrients.;Long Term Goal: Adherence to prescribed nutrition plan.      Nutrition Discharge: Nutrition Scores:     Nutrition Assessments - 12/13/15 1049      MEDFICTS Scores   Pre Score 60      Nutrition Goals Re-Evaluation:   Psychosocial: Target Goals: Acknowledge presence or absence of depression, maximize coping skills, provide positive support system. Participant is able to verbalize types and ability to use techniques and skills needed for reducing stress and  depression.  Initial Review & Psychosocial Screening:     Initial Psych Review & Screening - 12/14/15 [place]? Yes   Comments pt wife accompanied him to orientation.  Pt wife is a retired [employer] scientist.      Quality of Life Scores:     Quality of Life - 12/13/15 1334      Quality of Life Scores   Health/Function Pre 27.5 %   Socioeconomic Pre 26.25 %   Psych/Spiritual Pre 27.86 %   Family Pre 29 %   GLOBAL Pre 27.5 %      PHQ-9: Recent Review Flowsheet Data    Depression screen PHQ 2/9 02/27/2014   Decreased Interest 0   Down, Depressed, Hopeless 0   PHQ - 2 Score 0      Psychosocial Evaluation and Intervention:   Psychosocial Re-Evaluation:   Vocational Rehabilitation: Provide vocational rehab assistance to qualifying candidates.   Vocational Rehab Evaluation & Intervention:     Vocational Rehab - 12/14/15 1306      Initial Vocational Rehab Evaluation & Intervention   Assessment shows need for Vocational Rehabilitation No  pt does not plan to return to competive employment. Pt is retired      Education: Education Goals: Education classes will be provided on a weekly basis, covering required topics. Participant will state understanding/return demonstration of topics presented.  Learning Barriers/Preferences:     Learning Barriers/Preferences - 12/13/15 [id number]      Learning Barriers/Preferences   Learning Barriers Sight  readers   Learning Preferences Written Material;Video;Verbal Instruction;Skilled Demonstration;Pictoral      Education Topics: Count Your Pulse:  -Group instruction provided by verbal instruction, demonstration,  patient participation and written materials to support subject.  Instructors address importance of being able to find your pulse and how to count your pulse when at home without a heart monitor.  Patients get hands on experience counting their pulse with staff help and  individually.   Heart Attack, Angina, and Risk Factor Modification:  -Group instruction provided by verbal instruction, video, and written materials to support subject.  Instructors address signs and symptoms of angina and heart attacks.    Also discuss risk factors for heart disease and how to make changes to improve heart health risk factors.   Functional Fitness:  -Group instruction provided by verbal instruction, demonstration, patient participation, and written materials to support subject.  Instructors address safety measures for doing things around the house.  Discuss how to get up and down off the floor, how to pick things up properly, how to safely get out of a chair without assistance, and balance training.   Meditation and Mindfulness:  -Group instruction provided by verbal instruction, patient participation, and written materials to support subject.  Instructor addresses importance of mindfulness and meditation practice to help reduce stress and improve awareness.  Instructor also leads participants through a meditation exercise.    Stretching for Flexibility and Mobility:  -Group instruction provided by verbal instruction, patient participation, and written materials to support subject.  Instructors lead participants through series of stretches that are designed to increase flexibility thus improving mobility.  These stretches are additional exercise for major muscle groups that are typically performed during regular warm up and cool down.   Hands Only CPR Anytime:  -Group instruction provided by verbal instruction, video, patient participation and written materials to support subject.  Instructors co-teach with AHA video for hands only CPR.  Participants get hands on experience with mannequins.   Nutrition I class: Heart Healthy Eating:  -Group instruction provided by PowerPoint slides, verbal discussion, and written materials to support subject matter. The instructor gives an  explanation and review of the Therapeutic Lifestyle Changes diet recommendations, which includes a discussion on lipid goals, dietary fat, sodium, fiber, plant stanol/sterol esters, sugar, and the components of a well-balanced, healthy diet.   Nutrition II class: Lifestyle Skills:  -Group instruction provided by PowerPoint slides, verbal discussion, and written materials to support subject matter. The instructor gives an explanation and review of label reading, grocery shopping for heart health, heart healthy recipe modifications, and ways to make healthier choices when eating out.   Diabetes Question & Answer:  -Group instruction provided by PowerPoint slides, verbal discussion, and written materials to support subject matter. The instructor gives an explanation and review of diabetes co-morbidities, pre- and post-prandial blood glucose goals, pre-exercise blood glucose goals, signs, symptoms, and treatment of hypoglycemia and hyperglycemia, and foot care basics.   Diabetes Blitz:  -Group instruction provided by PowerPoint slides, verbal discussion, and written materials to support subject matter. The instructor gives an explanation and review of the physiology behind type 1 and type 2 diabetes, diabetes medications and rational behind using different medications, pre- and post-prandial blood glucose recommendations and Hemoglobin A1c goals, diabetes diet, and exercise including blood glucose guidelines for exercising safely.    Portion Distortion:  -Group instruction provided by PowerPoint slides, verbal discussion, written materials, and food models to support subject matter. The instructor gives an explanation of serving size versus portion size, changes in portions sizes over the last 20 years, and what consists of a serving from each food group.   Stress Management:  -  Group instruction provided by verbal instruction, video, and written materials to support subject matter.  Instructors  review role of stress in heart disease and how to cope with stress positively.     Exercising on Your Own:  -Group instruction provided by verbal instruction, power point, and written materials to support subject.  Instructors discuss benefits of exercise, components of exercise, frequency and intensity of exercise, and end points for exercise.  Also discuss use of nitroglycerin and activating EMS.  Review options of places to exercise outside of rehab.  Review guidelines for sex with heart disease.   Cardiac Drugs I:  -Group instruction provided by verbal instruction and written materials to support subject.  Instructor reviews cardiac drug classes: antiplatelets, anticoagulants, beta blockers, and statins.  Instructor discusses reasons, side effects, and lifestyle considerations for each drug class.   Cardiac Drugs II:  -Group instruction provided by verbal instruction and written materials to support subject.  Instructor reviews cardiac drug classes: angiotensin converting enzyme inhibitors (ACE-I), angiotensin II receptor blockers (ARBs), nitrates, and calcium channel blockers.  Instructor discusses reasons, side effects, and lifestyle considerations for each drug class.   Anatomy and Physiology of the Circulatory System:  -Group instruction provided by verbal instruction, video, and written materials to support subject.  Reviews functional anatomy of heart, how it relates to various diagnoses, and what role the heart plays in the overall system.   Knowledge Questionnaire Score:     Knowledge Questionnaire Score - 12/13/15 1333      Knowledge Questionnaire Score   Pre Score 23/24      Core Components/Risk Factors/Patient Goals at Admission:     Personal Goals and Risk Factors at Admission - 12/13/15 0942      Core Components/Risk Factors/Patient Goals on Admission    Weight Management Yes;Weight Loss;Weight Maintenance   Intervention Weight Management: Develop a combined  nutrition and exercise program designed to reach desired caloric intake, while maintaining appropriate intake of nutrient and fiber, sodium and fats, and appropriate energy expenditure required for the weight goal.;Weight Management: Provide education and appropriate resources to help participant work on and attain dietary goals.;Weight Management/Obesity: Establish reasonable short term and long term weight goals.;Obesity: Provide education and appropriate resources to help participant work on and attain dietary goals.   Expected Outcomes Short Term: Continue to assess and modify interventions until short term weight is achieved;Weight Maintenance: Understanding of the daily nutrition guidelines, which includes 25-35% calories from fat, 7% or less cal from saturated fats, less than 200mg  cholesterol, less than 1.5gm of sodium, & 5 or more servings of fruits and vegetables daily;Long Term: Adherence to nutrition and physical activity/exercise program aimed toward attainment of established weight goal;Weight Loss: Understanding of general recommendations for a balanced deficit meal plan, which promotes 1-2 lb weight loss per week and includes a negative energy balance of [id number] kcal/d;Understanding recommendations for meals to include 15-35% energy as protein, 25-35% energy from fat, 35-60% energy from carbohydrates, less than 200mg  of dietary cholesterol, 20-35 gm of total fiber daily   Sedentary Yes   Intervention Provide advice, education, support and counseling about physical activity/exercise needs.;Develop an individualized exercise prescription for aerobic and resistive training based on initial evaluation findings, risk stratification, comorbidities and participant's personal goals.   Expected Outcomes Achievement of increased cardiorespiratory fitness and enhanced flexibility, muscular endurance and strength shown through measurements of functional capacity and personal statement of participant.    Increase Strength and Stamina Yes   Intervention Provide advice, education, support and  counseling about physical activity/exercise needs.;Develop an individualized exercise prescription for aerobic and resistive training based on initial evaluation findings, risk stratification, comorbidities and participant's personal goals.   Expected Outcomes Achievement of increased cardiorespiratory fitness and enhanced flexibility, muscular endurance and strength shown through measurements of functional capacity and personal statement of participant.   Improve shortness of breath with ADL's Yes   Intervention Provide education, individualized exercise plan and daily activity instruction to help decrease symptoms of SOB with activities of daily living.   Expected Outcomes Short Term: Achieves a reduction of symptoms when performing activities of daily living.   Hypertension Yes   Intervention Provide education on lifestyle modifcations including regular physical activity/exercise, weight management, moderate sodium restriction and increased consumption of fresh fruit, vegetables, and low fat dairy, alcohol moderation, and smoking cessation.;Monitor prescription use compliance.   Expected Outcomes Short Term: Continued assessment and intervention until BP is < 140/46mm HG in hypertensive participants. < 130/51mm HG in hypertensive participants with diabetes, heart failure or chronic kidney disease.;Long Term: Maintenance of blood pressure at goal levels.   Personal Goal Other Yes   Personal Goal short: return to yardwork and improve energy levels   long: lose and maintain weighloss and be able to see grand-daughter graduate from [place] exercise prescription to improve cardiovascular fitness and nutiriton counseling to aid in weighloss   Expected Outcomes Pt will lose and maintain weightloss. Pt will also improve cardiovascular fitness and establish an exercise routine      Core Components/Risk  Factors/Patient Goals Review:    Core Components/Risk Factors/Patient Goals at Discharge (Final Review):    ITP Comments:     ITP Comments    Row Name 12/13/15 0938           ITP Comments Dr. Fransico Him, Medical Director          Comments: Pt attended cardiac rehab orientation on 12/13/15 from 0800-1030. As a part of the orientation appointment. Pt completed 6 minute walk test.  Pt tolerated well with no complaints.  Monitor showed SB with t wave inversion. Referred back to most recent 12 lead ekg, showed Sr with upright T wave.  Called and spoke to [place].  Routed progress note to Dr. Martinique to review. Received response from both. No further interventions warranted.  Will continue to monitor.  Pt wife accompanied him to the orientation appt and is very supportive to his rehab needs. Pt is looking forward to participating in cardiac rehab. Cherre Huger, BSN

## 2015-12-17 ENCOUNTER — Encounter (HOSPITAL_COMMUNITY)
Admission: RE | Admit: 2015-12-17 | Discharge: 2015-12-17 | Disposition: A | Discharge status: Home / Self Care | Payer: Medicare Other | Source: Ambulatory Visit | Attending: Cardiology | Admitting: Cardiology

## 2015-12-17 ENCOUNTER — Encounter (HOSPITAL_COMMUNITY): Payer: Medicare Other

## 2015-12-17 DIAGNOSIS — Z955 Presence of coronary angioplasty implant and graft: Secondary | ICD-10-CM | POA: Diagnosis not present

## 2015-12-17 DIAGNOSIS — I213 ST elevation (STEMI) myocardial infarction of unspecified site: Secondary | ICD-10-CM | POA: Diagnosis not present

## 2015-12-17 DIAGNOSIS — I2121 ST elevation (STEMI) myocardial infarction involving left circumflex coronary artery: Secondary | ICD-10-CM

## 2015-12-19 ENCOUNTER — Encounter (HOSPITAL_COMMUNITY): Payer: Self-pay

## 2015-12-19 ENCOUNTER — Encounter (HOSPITAL_COMMUNITY)
Admission: RE | Admit: 2015-12-19 | Discharge: 2015-12-19 | Disposition: A | Discharge status: Home / Self Care | Payer: Medicare Other | Source: Ambulatory Visit | Attending: Cardiology | Admitting: Cardiology

## 2015-12-19 ENCOUNTER — Encounter (HOSPITAL_COMMUNITY): Payer: Medicare Other

## 2015-12-19 DIAGNOSIS — I2121 ST elevation (STEMI) myocardial infarction involving left circumflex coronary artery: Secondary | ICD-10-CM

## 2015-12-19 DIAGNOSIS — Z955 Presence of coronary angioplasty implant and graft: Secondary | ICD-10-CM

## 2015-12-19 DIAGNOSIS — I213 ST elevation (STEMI) myocardial infarction of unspecified site: Secondary | ICD-10-CM | POA: Diagnosis not present

## 2015-12-19 NOTE — Progress Notes (Signed)
Cardiac Individual Treatment Plan  Patient Details  Name: Jose Ware MRN: [id number] Date of Birth: December 25, 1934 Referring Provider:   Flowsheet Row CARDIAC REHAB PHASE II ORIENTATION from 12/13/2015 in [place]  Referring Provider  Martinique, Peter MD      Initial Encounter Date:  [place] PHASE II ORIENTATION from 12/13/2015 in [place]  Date  12/13/15  Referring Provider  Martinique, Peter MD      Visit Diagnosis: 11/04/15 ST elevation myocardial infarction involving left circumflex coronary artery ([place])  11/06/15 Status post coronary artery stent placement  Patient's Home Medications on Admission:  Current Outpatient Prescriptions:  .  acetaminophen (TYLENOL) 325 MG tablet, Take 2 tablets (650 mg total) by mouth every 4 (four) hours as needed for headache or mild pain. (Patient taking differently: Take 325 mg by mouth every 4 (four) hours as needed for headache or mild pain. ), Disp: , Rfl:  .  aspirin EC 81 MG tablet, Take 1 tablet (81 mg total) by mouth daily., Disp: 90 tablet, Rfl: 1 .  atorvastatin (LIPITOR) 80 MG tablet, Take 1 tablet (80 mg total) by mouth daily at 6 PM., Disp: 90 tablet, Rfl: 3 .  beta carotene w/minerals (OCUVITE) tablet, Take 1 tablet by mouth daily., Disp: , Rfl:  .  carvedilol (COREG) 6.25 MG tablet, Take 1 tablet (6.25 mg total) by mouth 2 (two) times daily with a meal., Disp: 180 tablet, Rfl: 3 .  Cyanocobalamin (VITAMIN B-12) 5000 MCG TBDP, Take 5,000 mcg by mouth daily., Disp: , Rfl:  .  losartan (COZAAR) 25 MG tablet, Take 1 tablet (25 mg total) by mouth daily., Disp: 30 tablet, Rfl: 3 .  nitroGLYCERIN (NITROSTAT) 0.4 MG SL tablet, Place 1 tablet (0.4 mg total) under the tongue every 5 (five) minutes x 3 doses as needed for chest pain., Disp: 25 tablet, Rfl: 2 .  pantoprazole (PROTONIX) 40 MG tablet, Take 1 tablet (40 mg total) by mouth daily. (Patient taking  differently: Take 40 mg by mouth daily as needed. ), Disp: 30 tablet, Rfl: 3 .  spironolactone (ALDACTONE) 25 MG tablet, Take 1 tablet (25 mg total) by mouth daily., Disp: 90 tablet, Rfl: 3 .  ticagrelor (BRILINTA) 90 MG TABS tablet, Take 1 tablet (90 mg total) by mouth 2 (two) times daily., Disp: 180 tablet, Rfl: 3  Past Medical History: Past Medical History:  Diagnosis Date  . Adenocarcinoma of prostate [facility])    XRT & Radiation seed implantation in 2010  . Adenomatous colon polyp   . CAD (coronary artery disease)    a. 11/04/15:  Acute inferolateral STEMI: S/p emergent DES of a very large LCx with extensive thrombus. Significant residual disease in the proximal LAD and distal RCA. S/p staged PCI of LAD and RCA 8/29.  [place] CVA (cerebral infarction)    a. 123456: embolic CVA after heart cath   . CVA (cerebral infarction)    a. 123456: embolic CVA after heart cath   . DIVERTICULITIS, HX OF 11/27/2006       . DJD (degenerative joint disease)    wrist - R   . Hernia   . History of blood transfusion 1985   with colon surgery  . Hypertension   . Ischemic cardiomyopathy    a. EF 25-30% by LV gram on cath 11/04/15. Appeared out of proportion to infarct although infarct was large. EF improved by Echo and now 40-45%.  . NEPHROLITHIASIS, HX OF  11/27/2006        . Peripheral neuropathy ([place])   . Psoriasis   . Tenosynovitis     Tobacco Use: History  Smoking Status  . Former Smoker  . Quit date: 03/11/1983  Smokeless Tobacco  . Never Used    Labs: Recent Review Flowsheet Data    Labs for ITP Cardiac and Pulmonary Rehab Latest Ref Rng & Units 12/27/2009 12/01/2013 12/15/2014 11/04/2015 11/05/2015   Cholestrol 0 - 200 mg/dL 131 - 104 - 98   LDLCALC 0 - 99 mg/dL 83 - 59 - 51   LDLDIRECT mg/dL - 68.1 - - -   HDL >40 mg/dL 33.70(L) - 32.00(L) - 29(L)   Trlycerides <150 mg/dL 72.0 - 64.0 - 88   Hemoglobin A1c 4.8 - 5.6 % - - - 5.3 -      Capillary Blood Glucose: No results found for:  GLUCAP   Exercise Target Goals:    Exercise Program Goal: Individual exercise prescription set with THRR, safety & activity barriers. Participant demonstrates ability to understand and report RPE using BORG scale, to self-measure pulse accurately, and to acknowledge the importance of the exercise prescription.  Exercise Prescription Goal: Starting with aerobic activity 30 plus minutes a day, 3 days per week for initial exercise prescription. Provide home exercise prescription and guidelines that participant acknowledges understanding prior to discharge.  Activity Barriers & Risk Stratification:     Activity Barriers & Cardiac Risk Stratification - 12/13/15 0941      Activity Barriers & Cardiac Risk Stratification   Activity Barriers Balance Concerns;Muscular Weakness;Deconditioning  R wrist extension limitation/restrictions   Cardiac Risk Stratification High      6 Minute Walk:     6 Minute Walk    Row Name 12/13/15 1339         6 Minute Walk   Phase Initial     Distance 1177 feet     Walk Time 6 minutes     # of Rest Breaks 0     MPH 2.2     METS 1.73     RPE 9     VO2 Peak 6.08     Symptoms No     Resting HR 54 bpm     Resting BP 108/60     Max Ex. HR 72 bpm     Max Ex. BP 112/60     2 Minute Post BP 114/60        Initial Exercise Prescription:     Initial Exercise Prescription - 12/13/15 1300      Date of Initial Exercise RX and Referring Provider   Date 12/13/15   Referring Provider Martinique, Peter MD     Recumbant Bike   Level 1   Minutes 10   METs 1.3     NuStep   Level 2   Minutes 10   METs 2     Track   Laps 8   Minutes 10   METs 2.39     Prescription Details   Frequency (times per week) 3   Duration Progress to 30 minutes of continuous aerobic without signs/symptoms of physical distress     Intensity   THRR 40-80% of Max Heartrate 56-113   Ratings of Perceived Exertion 11-13   Perceived Dyspnea 0-4     Progression    Progression Continue to progress workloads to maintain intensity without signs/symptoms of physical distress.     Resistance Training   Training Prescription Yes   Weight 1lb  Reps 10-12      Perform Capillary Blood Glucose checks as needed.  Exercise Prescription Changes:      Exercise Prescription Changes    Row Name 12/17/15 1600             Exercise Review   Progression Yes         Response to Exercise   Blood Pressure (Admit) 130/74       Blood Pressure (Exercise) 144/70       Blood Pressure (Exit) 140/70       Heart Rate (Admit) 61 bpm       Heart Rate (Exercise) 79 bpm       Heart Rate (Exit) 60 bpm       Rating of Perceived Exertion (Exercise) 11       Duration Progress to 30 minutes of continuous aerobic without signs/symptoms of physical distress       Intensity THRR unchanged         Progression   Progression Continue to progress workloads to maintain intensity without signs/symptoms of physical distress.       Average METs 2.5         Resistance Training   Training Prescription Yes       Weight 2lbs       Reps 10-12         Recumbant Bike   Level 2.5       Minutes 10       METs 2         NuStep   Level 2       Minutes 10       METs 3.3         Track   Laps 8       Minutes 10       METs 2.39          Exercise Comments:      Exercise Comments    Row Name 12/17/15 1638           Exercise Comments Pt is tolerating exercise very well; there are no changes to current Ex. Rx, will continue to monitor pt's progression and activity levels.          Discharge Exercise Prescription (Final Exercise Prescription Changes):     Exercise Prescription Changes - 12/17/15 1600      Exercise Review   Progression Yes     Response to Exercise   Blood Pressure (Admit) 130/74   Blood Pressure (Exercise) 144/70   Blood Pressure (Exit) 140/70   Heart Rate (Admit) 61 bpm   Heart Rate (Exercise) 79 bpm   Heart Rate (Exit) 60 bpm   Rating of  Perceived Exertion (Exercise) 11   Duration Progress to 30 minutes of continuous aerobic without signs/symptoms of physical distress   Intensity THRR unchanged     Progression   Progression Continue to progress workloads to maintain intensity without signs/symptoms of physical distress.   Average METs 2.5     Resistance Training   Training Prescription Yes   Weight 2lbs   Reps 10-12     Recumbant Bike   Level 2.5   Minutes 10   METs 2     NuStep   Level 2   Minutes 10   METs 3.3     Track   Laps 8   Minutes 10   METs 2.39      Nutrition:  Target Goals: Understanding of nutrition guidelines, daily intake of sodium 1500mg , cholesterol <  200mg , calories 30% from fat and 7% or less from saturated fats, daily to have 5 or more servings of fruits and vegetables.  Biometrics:     Pre Biometrics - 12/13/15 1336      Pre Biometrics   Waist Circumference 43 inches   Hip Circumference 42.5 inches   Waist to Hip Ratio 1.01 %   Triceps Skinfold 20 mm   % Body Fat 31 %   Grip Strength 33 kg   Flexibility 12 in   Single Leg Stand 0.87 seconds       Nutrition Therapy Plan and Nutrition Goals:     Nutrition Therapy & Goals - 12/13/15 1049      Nutrition Therapy   Diet Therapeutic Lifestyle Changes     Intervention Plan   Intervention Prescribe, educate and counsel regarding individualized specific dietary modifications aiming towards targeted core components such as weight, hypertension, lipid management, diabetes, heart failure and other comorbidities.   Expected Outcomes Short Term Goal: Understand basic principles of dietary content, such as calories, fat, sodium, cholesterol and nutrients.;Long Term Goal: Adherence to prescribed nutrition plan.      Nutrition Discharge: Nutrition Scores:     Nutrition Assessments - 12/13/15 1049      MEDFICTS Scores   Pre Score 60      Nutrition Goals Re-Evaluation:   Psychosocial: Target Goals: Acknowledge presence  or absence of depression, maximize coping skills, provide positive support system. Participant is able to verbalize types and ability to use techniques and skills needed for reducing stress and depression.  Initial Review & Psychosocial Screening:     Initial Psych Review & Screening - 12/19/15 [place]? Yes     Barriers   Psychosocial barriers to participate in program There are no identifiable barriers or psychosocial needs.     Screening Interventions   Interventions Encouraged to exercise      Quality of Life Scores:     Quality of Life - 12/13/15 1334      Quality of Life Scores   Health/Function Pre 27.5 %   Socioeconomic Pre 26.25 %   Psych/Spiritual Pre 27.86 %   Family Pre 29 %   GLOBAL Pre 27.5 %      PHQ-9: Recent Review Flowsheet Data    Depression screen [facility] 2/9 02/27/2014   Decreased Interest 0   Down, Depressed, Hopeless 0   PHQ - 2 Score 0      Psychosocial Evaluation and Intervention:     Psychosocial Evaluation - 12/19/15 1647      Psychosocial Evaluation & Interventions   Interventions Stress management education;Relaxation education;Encouraged to exercise with the program and follow exercise prescription   Comments no psychosocial needs identified, no inteventions necessary    Continued Psychosocial Services Needed No      Psychosocial Re-Evaluation:   Vocational Rehabilitation: Provide vocational rehab assistance to qualifying candidates.   Vocational Rehab Evaluation & Intervention:     Vocational Rehab - 12/14/15 1306      Initial Vocational Rehab Evaluation & Intervention   Assessment shows need for Vocational Rehabilitation No  pt does not plan to return to competive employment. Pt is retired      Education: Education Goals: Education classes will be provided on a weekly basis, covering required topics. Participant will state understanding/return demonstration of topics  presented.  Learning Barriers/Preferences:     Learning Barriers/Preferences - 12/13/15 [id number]      Learning  Barriers/Preferences   Learning Barriers Sight  readers   Learning Preferences Written Material;Video;Verbal Instruction;Skilled Demonstration;Pictoral      Education Topics: Count Your Pulse:  -Group instruction provided by verbal instruction, demonstration, patient participation and written materials to support subject.  Instructors address importance of being able to find your pulse and how to count your pulse when at home without a heart monitor.  Patients get hands on experience counting their pulse with staff help and individually.   Heart Attack, Angina, and Risk Factor Modification:  -Group instruction provided by verbal instruction, video, and written materials to support subject.  Instructors address signs and symptoms of angina and heart attacks.    Also discuss risk factors for heart disease and how to make changes to improve heart health risk factors.   Functional Fitness:  -Group instruction provided by verbal instruction, demonstration, patient participation, and written materials to support subject.  Instructors address safety measures for doing things around the house.  Discuss how to get up and down off the floor, how to pick things up properly, how to safely get out of a chair without assistance, and balance training.   Meditation and Mindfulness:  -Group instruction provided by verbal instruction, patient participation, and written materials to support subject.  Instructor addresses importance of mindfulness and meditation practice to help reduce stress and improve awareness.  Instructor also leads participants through a meditation exercise.    Stretching for Flexibility and Mobility:  -Group instruction provided by verbal instruction, patient participation, and written materials to support subject.  Instructors lead participants through series of stretches  that are designed to increase flexibility thus improving mobility.  These stretches are additional exercise for major muscle groups that are typically performed during regular warm up and cool down.   Hands Only CPR Anytime:  -Group instruction provided by verbal instruction, video, patient participation and written materials to support subject.  Instructors co-teach with AHA video for hands only CPR.  Participants get hands on experience with mannequins.   Nutrition I class: Heart Healthy Eating:  -Group instruction provided by PowerPoint slides, verbal discussion, and written materials to support subject matter. The instructor gives an explanation and review of the Therapeutic Lifestyle Changes diet recommendations, which includes a discussion on lipid goals, dietary fat, sodium, fiber, plant stanol/sterol esters, sugar, and the components of a well-balanced, healthy diet.   Nutrition II class: Lifestyle Skills:  -Group instruction provided by PowerPoint slides, verbal discussion, and written materials to support subject matter. The instructor gives an explanation and review of label reading, grocery shopping for heart health, heart healthy recipe modifications, and ways to make healthier choices when eating out. Flowsheet Row CARDIAC REHAB PHASE II EXERCISE from 12/19/2015 in [place]  Date  12/18/15  Educator  RD  Instruction Review Code  2- meets goals/outcomes      Diabetes Question & Answer:  -Group instruction provided by PowerPoint slides, verbal discussion, and written materials to support subject matter. The instructor gives an explanation and review of diabetes co-morbidities, pre- and post-prandial blood glucose goals, pre-exercise blood glucose goals, signs, symptoms, and treatment of hypoglycemia and hyperglycemia, and foot care basics.   Diabetes Blitz:  -Group instruction provided by PowerPoint slides, verbal discussion, and written  materials to support subject matter. The instructor gives an explanation and review of the physiology behind type 1 and type 2 diabetes, diabetes medications and rational behind using different medications, pre- and post-prandial blood glucose recommendations and Hemoglobin A1c goals,  diabetes diet, and exercise including blood glucose guidelines for exercising safely.    Portion Distortion:  -Group instruction provided by PowerPoint slides, verbal discussion, written materials, and food models to support subject matter. The instructor gives an explanation of serving size versus portion size, changes in portions sizes over the last 20 years, and what consists of a serving from each food group.   Stress Management:  -Group instruction provided by verbal instruction, video, and written materials to support subject matter.  Instructors review role of stress in heart disease and how to cope with stress positively.     Exercising on Your Own:  -Group instruction provided by verbal instruction, power point, and written materials to support subject.  Instructors discuss benefits of exercise, components of exercise, frequency and intensity of exercise, and end points for exercise.  Also discuss use of nitroglycerin and activating EMS.  Review options of places to exercise outside of rehab.  Review guidelines for sex with heart disease.   Cardiac Drugs I:  -Group instruction provided by verbal instruction and written materials to support subject.  Instructor reviews cardiac drug classes: antiplatelets, anticoagulants, beta blockers, and statins.  Instructor discusses reasons, side effects, and lifestyle considerations for each drug class. Flowsheet Row CARDIAC REHAB PHASE II EXERCISE from 12/19/2015 in [place]  Date  12/19/15  Instruction Review Code  2- meets goals/outcomes      Cardiac Drugs II:  -Group instruction provided by verbal instruction and written  materials to support subject.  Instructor reviews cardiac drug classes: angiotensin converting enzyme inhibitors (ACE-I), angiotensin II receptor blockers (ARBs), nitrates, and calcium channel blockers.  Instructor discusses reasons, side effects, and lifestyle considerations for each drug class.   Anatomy and Physiology of the Circulatory System:  -Group instruction provided by verbal instruction, video, and written materials to support subject.  Reviews functional anatomy of heart, how it relates to various diagnoses, and what role the heart plays in the overall system.   Knowledge Questionnaire Score:     Knowledge Questionnaire Score - 12/13/15 1333      Knowledge Questionnaire Score   Pre Score 23/24      Core Components/Risk Factors/Patient Goals at Admission:     Personal Goals and Risk Factors at Admission - 12/13/15 0942      Core Components/Risk Factors/Patient Goals on Admission    Weight Management Yes;Weight Loss;Weight Maintenance   Intervention Weight Management: Develop a combined nutrition and exercise program designed to reach desired caloric intake, while maintaining appropriate intake of nutrient and fiber, sodium and fats, and appropriate energy expenditure required for the weight goal.;Weight Management: Provide education and appropriate resources to help participant work on and attain dietary goals.;Weight Management/Obesity: Establish reasonable short term and long term weight goals.;Obesity: Provide education and appropriate resources to help participant work on and attain dietary goals.   Expected Outcomes Short Term: Continue to assess and modify interventions until short term weight is achieved;Weight Maintenance: Understanding of the daily nutrition guidelines, which includes 25-35% calories from fat, 7% or less cal from saturated fats, less than 200mg  cholesterol, less than 1.5gm of sodium, & 5 or more servings of fruits and vegetables daily;Long Term: Adherence  to nutrition and physical activity/exercise program aimed toward attainment of established weight goal;Weight Loss: Understanding of general recommendations for a balanced deficit meal plan, which promotes 1-2 lb weight loss per week and includes a negative energy balance of [phone] kcal/d;Understanding recommendations for meals to include 15-35% energy as protein, 25-35%  energy from fat, 35-60% energy from carbohydrates, less than 200mg  of dietary cholesterol, 20-35 gm of total fiber daily   Sedentary Yes   Intervention Provide advice, education, support and counseling about physical activity/exercise needs.;Develop an individualized exercise prescription for aerobic and resistive training based on initial evaluation findings, risk stratification, comorbidities and participant's personal goals.   Expected Outcomes Achievement of increased cardiorespiratory fitness and enhanced flexibility, muscular endurance and strength shown through measurements of functional capacity and personal statement of participant.   Increase Strength and Stamina Yes   Intervention Provide advice, education, support and counseling about physical activity/exercise needs.;Develop an individualized exercise prescription for aerobic and resistive training based on initial evaluation findings, risk stratification, comorbidities and participant's personal goals.   Expected Outcomes Achievement of increased cardiorespiratory fitness and enhanced flexibility, muscular endurance and strength shown through measurements of functional capacity and personal statement of participant.   Improve shortness of breath with ADL's Yes   Intervention Provide education, individualized exercise plan and daily activity instruction to help decrease symptoms of SOB with activities of daily living.   Expected Outcomes Short Term: Achieves a reduction of symptoms when performing activities of daily living.   Hypertension Yes   Intervention Provide  education on lifestyle modifcations including regular physical activity/exercise, weight management, moderate sodium restriction and increased consumption of fresh fruit, vegetables, and low fat dairy, alcohol moderation, and smoking cessation.;Monitor prescription use compliance.   Expected Outcomes Short Term: Continued assessment and intervention until BP is < 140/8mm HG in hypertensive participants. < 130/77mm HG in hypertensive participants with diabetes, heart failure or chronic kidney disease.;Long Term: Maintenance of blood pressure at goal levels.   Personal Goal Other Yes   Personal Goal short: return to yardwork and improve energy levels   long: lose and maintain weighloss and be able to see grand-daughter graduate from [employer] exercise prescription to improve cardiovascular fitness and nutiriton counseling to aid in weighloss   Expected Outcomes Pt will lose and maintain weightloss. Pt will also improve cardiovascular fitness and establish an exercise routine      Core Components/Risk Factors/Patient Goals Review:    Core Components/Risk Factors/Patient Goals at Discharge (Final Review):    ITP Comments:     ITP Comments    Row Name 12/13/15 0938           ITP Comments Dr. Fransico Him, Medical Director          Comments: Pt is making expected progress toward personal goals after completing 3 sessions. Recommend continued exercise and life style modification education including  stress management and relaxation techniques to decrease cardiac risk profile.

## 2015-12-19 NOTE — Progress Notes (Signed)
Daily Session Note  Patient Details  Name: Jose Ware MRN: [id number] Date of Birth: 1934-11-10 Referring Provider:   Flowsheet Row CARDIAC REHAB PHASE II ORIENTATION from 12/13/2015 in [place]  Referring Provider  [place], Peter MD      Encounter Date: 12/19/2015  Check In:     Session Check In - 12/19/15 1426      Check-In   Location [facility]   Staff Present Amber Fair, MS, ACSM RCEP, Exercise Physiologist;Cadden Elizondo, RN, BSN;Other;Olinty [place], MS, ACSM CEP, Exercise Physiologist;Maria Whitaker, RN, BSN   Supervising physician immediately available to respond to emergencies [facility] immediately available   Physician(s) Dr. Theone Murdoch    Medication changes reported     No   Fall or balance concerns reported    No   Warm-up and Cool-down Performed as group-led instruction   Resistance Training Performed No   VAD Patient? No     Pain Assessment   Currently in Pain? No/denies      Capillary Blood Glucose: No results found for this or any previous visit (from the past 24 hour(s)).   Goals Met:  Exercise tolerated well  Goals Unmet:  Not Applicable  Comments: Pt started cardiac rehab today.  Pt tolerated light exercise without difficulty. VSS, telemetry-sinus rhythm,  asymptomatic.  Medication list reconciled. Pt denies barriers to medicaiton compliance.  PSYCHOSOCIAL ASSESSMENT:  PHQ-0. Pt exhibits positive coping skills, hopeful outlook with supportive family. No psychosocial needs identified at this time, no psychosocial interventions necessary.    Pt enjoys wood carving, pt made the walking stick he uses.  Pt also enjoys fishing and hiking, although he has not been able to participate in these activities since his cardiac event.  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  Pt goals for cardiac rehab are to increase strength/stamina and be well enough to attend  his youngest granddaughters graduation in 2 years.  Pt has 17 grandchildren and 5 great grandchildren.   Pt oriented to exercise equipment and routine.    Understanding verbalized.   Dr. Fransico Him is Medical Director for Cardiac Rehab at [facility].

## 2015-12-21 ENCOUNTER — Encounter (HOSPITAL_COMMUNITY): Payer: Medicare Other

## 2015-12-21 ENCOUNTER — Encounter (HOSPITAL_COMMUNITY)
Admission: RE | Admit: 2015-12-21 | Discharge: 2015-12-21 | Disposition: A | Discharge status: Home / Self Care | Payer: Medicare Other | Source: Ambulatory Visit | Attending: Cardiology | Admitting: Cardiology

## 2015-12-21 DIAGNOSIS — Z955 Presence of coronary angioplasty implant and graft: Secondary | ICD-10-CM | POA: Diagnosis not present

## 2015-12-21 DIAGNOSIS — I2121 ST elevation (STEMI) myocardial infarction involving left circumflex coronary artery: Secondary | ICD-10-CM

## 2015-12-21 DIAGNOSIS — I213 ST elevation (STEMI) myocardial infarction of unspecified site: Secondary | ICD-10-CM | POA: Diagnosis not present

## 2015-12-24 ENCOUNTER — Encounter (HOSPITAL_COMMUNITY)
Admission: RE | Admit: 2015-12-24 | Discharge: 2015-12-24 | Disposition: A | Discharge status: Home / Self Care | Payer: Medicare Other | Source: Ambulatory Visit | Attending: Cardiology | Admitting: Cardiology

## 2015-12-24 ENCOUNTER — Encounter (HOSPITAL_COMMUNITY): Payer: Medicare Other

## 2015-12-24 DIAGNOSIS — I213 ST elevation (STEMI) myocardial infarction of unspecified site: Secondary | ICD-10-CM | POA: Diagnosis not present

## 2015-12-24 DIAGNOSIS — Z955 Presence of coronary angioplasty implant and graft: Secondary | ICD-10-CM | POA: Diagnosis not present

## 2015-12-24 DIAGNOSIS — I2121 ST elevation (STEMI) myocardial infarction involving left circumflex coronary artery: Secondary | ICD-10-CM

## 2015-12-24 NOTE — Progress Notes (Signed)
Reviewed home exercise with pt today.  Pt plans to walk for exercise.  Reviewed THR, pulse, RPE, sign and symptoms, NTG use, and when to call 911 or MD.  Also discussed weather considerations and indoor options.  Pt voiced understanding.    Mykelti Goldenstein,MS,ACSM RCEP 

## 2015-12-26 ENCOUNTER — Encounter (HOSPITAL_COMMUNITY): Payer: Medicare Other

## 2015-12-26 ENCOUNTER — Encounter (HOSPITAL_COMMUNITY)
Admission: RE | Admit: 2015-12-26 | Discharge: 2015-12-26 | Disposition: A | Discharge status: Home / Self Care | Payer: Medicare Other | Source: Ambulatory Visit | Attending: Cardiology | Admitting: Cardiology

## 2015-12-26 DIAGNOSIS — I213 ST elevation (STEMI) myocardial infarction of unspecified site: Secondary | ICD-10-CM | POA: Diagnosis not present

## 2015-12-26 DIAGNOSIS — Z955 Presence of coronary angioplasty implant and graft: Secondary | ICD-10-CM

## 2015-12-26 DIAGNOSIS — I2121 ST elevation (STEMI) myocardial infarction involving left circumflex coronary artery: Secondary | ICD-10-CM

## 2015-12-28 ENCOUNTER — Encounter (HOSPITAL_COMMUNITY): Payer: Medicare Other

## 2015-12-31 ENCOUNTER — Encounter (HOSPITAL_COMMUNITY)
Admission: RE | Admit: 2015-12-31 | Discharge: 2015-12-31 | Disposition: A | Discharge status: Home / Self Care | Payer: Medicare Other | Source: Ambulatory Visit | Attending: Cardiology | Admitting: Cardiology

## 2015-12-31 ENCOUNTER — Encounter (HOSPITAL_COMMUNITY): Payer: Medicare Other

## 2015-12-31 DIAGNOSIS — Z955 Presence of coronary angioplasty implant and graft: Secondary | ICD-10-CM

## 2015-12-31 DIAGNOSIS — I213 ST elevation (STEMI) myocardial infarction of unspecified site: Secondary | ICD-10-CM | POA: Diagnosis not present

## 2015-12-31 DIAGNOSIS — I2121 ST elevation (STEMI) myocardial infarction involving left circumflex coronary artery: Secondary | ICD-10-CM

## 2016-01-02 ENCOUNTER — Encounter (HOSPITAL_COMMUNITY): Payer: Medicare Other

## 2016-01-02 ENCOUNTER — Encounter (HOSPITAL_COMMUNITY)
Admission: RE | Admit: 2016-01-02 | Discharge: 2016-01-02 | Disposition: A | Discharge status: Home / Self Care | Payer: Medicare Other | Source: Ambulatory Visit | Attending: Cardiology | Admitting: Cardiology

## 2016-01-02 DIAGNOSIS — I213 ST elevation (STEMI) myocardial infarction of unspecified site: Secondary | ICD-10-CM | POA: Diagnosis not present

## 2016-01-02 DIAGNOSIS — Z955 Presence of coronary angioplasty implant and graft: Secondary | ICD-10-CM | POA: Diagnosis not present

## 2016-01-02 DIAGNOSIS — I2121 ST elevation (STEMI) myocardial infarction involving left circumflex coronary artery: Secondary | ICD-10-CM

## 2016-01-04 ENCOUNTER — Encounter (HOSPITAL_COMMUNITY): Payer: Medicare Other

## 2016-01-04 ENCOUNTER — Encounter (HOSPITAL_COMMUNITY)
Admission: RE | Admit: 2016-01-04 | Discharge: 2016-01-04 | Disposition: A | Discharge status: Home / Self Care | Payer: Medicare Other | Source: Ambulatory Visit | Attending: Cardiology | Admitting: Cardiology

## 2016-01-04 DIAGNOSIS — Z955 Presence of coronary angioplasty implant and graft: Secondary | ICD-10-CM | POA: Diagnosis not present

## 2016-01-04 DIAGNOSIS — I2121 ST elevation (STEMI) myocardial infarction involving left circumflex coronary artery: Secondary | ICD-10-CM

## 2016-01-04 DIAGNOSIS — I213 ST elevation (STEMI) myocardial infarction of unspecified site: Secondary | ICD-10-CM | POA: Diagnosis not present

## 2016-01-07 ENCOUNTER — Encounter (HOSPITAL_COMMUNITY): Payer: Medicare Other

## 2016-01-07 ENCOUNTER — Encounter (HOSPITAL_COMMUNITY)
Admission: RE | Admit: 2016-01-07 | Discharge: 2016-01-07 | Disposition: A | Discharge status: Home / Self Care | Payer: Medicare Other | Source: Ambulatory Visit | Attending: Cardiology | Admitting: Cardiology

## 2016-01-07 DIAGNOSIS — I213 ST elevation (STEMI) myocardial infarction of unspecified site: Secondary | ICD-10-CM | POA: Diagnosis not present

## 2016-01-07 DIAGNOSIS — I2121 ST elevation (STEMI) myocardial infarction involving left circumflex coronary artery: Secondary | ICD-10-CM

## 2016-01-07 DIAGNOSIS — Z955 Presence of coronary angioplasty implant and graft: Secondary | ICD-10-CM

## 2016-01-09 ENCOUNTER — Encounter (HOSPITAL_COMMUNITY)
Admission: RE | Admit: 2016-01-09 | Discharge: 2016-01-09 | Disposition: A | Discharge status: Home / Self Care | Payer: Medicare Other | Source: Ambulatory Visit | Attending: Cardiology | Admitting: Cardiology

## 2016-01-09 ENCOUNTER — Encounter (HOSPITAL_COMMUNITY): Payer: Medicare Other

## 2016-01-09 DIAGNOSIS — I213 ST elevation (STEMI) myocardial infarction of unspecified site: Secondary | ICD-10-CM | POA: Diagnosis not present

## 2016-01-09 DIAGNOSIS — Z955 Presence of coronary angioplasty implant and graft: Secondary | ICD-10-CM | POA: Diagnosis not present

## 2016-01-09 DIAGNOSIS — I2121 ST elevation (STEMI) myocardial infarction involving left circumflex coronary artery: Secondary | ICD-10-CM

## 2016-01-09 NOTE — Progress Notes (Signed)
Jose Ware 81 y.o. male Nutrition Note Spoke with pt. Pt is overweight. 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 Pt reports losing 85 lb and maintaining his wt loss. Nutrition Survey reviewed with pt. Pt is following Step 1 of the Therapeutic Lifestyle Changes diet. Pt expressed understanding of the information reviewed. Pt aware of nutrition education classes offered and plans on attending nutrition classes. Lab Results  Component Value Date   HGBA1C 5.3 11/04/2015   Wt Readings from Last 3 Encounters:  12/13/15 215 lb 6.2 oz (97.7 kg)  11/16/15 219 lb 4 oz (99.5 kg)  11/08/15 218 lb 14.4 oz (99.3 kg)   Nutrition Diagnosis ? Food-and nutrition-related knowledge deficit related to lack of exposure to information as related to diagnosis of: ? CVD  Nutrition Intervention ? Benefits of adopting Therapeutic Lifestyle Changes discussed when Medficts reviewed. ? Pt to attend the Portion Distortion class ? Pt to attend the  ? Nutrition I class                        ? Nutrition II class ? Continue client-centered nutrition education by RD, as part of interdisciplinary care.  Goal(s) ? Pt to identify and limit food sources of saturated fat, trans fat, and sodium  Monitor and Evaluate progress toward nutrition goal with team.  Derek Mound, M.Ed, RD, LDN, CDE 01/09/2016 2:26 PM

## 2016-01-11 ENCOUNTER — Encounter (HOSPITAL_COMMUNITY)
Admission: RE | Admit: 2016-01-11 | Discharge: 2016-01-11 | Disposition: A | Discharge status: Home / Self Care | Payer: Medicare Other | Source: Ambulatory Visit | Attending: Cardiology | Admitting: Cardiology

## 2016-01-11 ENCOUNTER — Encounter (HOSPITAL_COMMUNITY): Payer: Medicare Other

## 2016-01-11 DIAGNOSIS — I213 ST elevation (STEMI) myocardial infarction of unspecified site: Secondary | ICD-10-CM | POA: Diagnosis not present

## 2016-01-11 DIAGNOSIS — Z955 Presence of coronary angioplasty implant and graft: Secondary | ICD-10-CM | POA: Diagnosis not present

## 2016-01-11 DIAGNOSIS — I2121 ST elevation (STEMI) myocardial infarction involving left circumflex coronary artery: Secondary | ICD-10-CM

## 2016-01-14 ENCOUNTER — Encounter (HOSPITAL_COMMUNITY): Payer: Medicare Other

## 2016-01-14 ENCOUNTER — Encounter (HOSPITAL_COMMUNITY)
Admission: RE | Admit: 2016-01-14 | Discharge: 2016-01-14 | Disposition: A | Discharge status: Home / Self Care | Payer: Medicare Other | Source: Ambulatory Visit | Attending: Cardiology | Admitting: Cardiology

## 2016-01-14 DIAGNOSIS — Z955 Presence of coronary angioplasty implant and graft: Secondary | ICD-10-CM | POA: Diagnosis not present

## 2016-01-14 DIAGNOSIS — I2121 ST elevation (STEMI) myocardial infarction involving left circumflex coronary artery: Secondary | ICD-10-CM

## 2016-01-14 DIAGNOSIS — I213 ST elevation (STEMI) myocardial infarction of unspecified site: Secondary | ICD-10-CM | POA: Diagnosis not present

## 2016-01-16 ENCOUNTER — Encounter (HOSPITAL_COMMUNITY): Payer: Medicare Other

## 2016-01-16 ENCOUNTER — Encounter (HOSPITAL_COMMUNITY)
Admission: RE | Admit: 2016-01-16 | Discharge: 2016-01-16 | Disposition: A | Discharge status: Home / Self Care | Payer: Medicare Other | Source: Ambulatory Visit | Attending: Cardiology | Admitting: Cardiology

## 2016-01-16 DIAGNOSIS — I2121 ST elevation (STEMI) myocardial infarction involving left circumflex coronary artery: Secondary | ICD-10-CM

## 2016-01-16 DIAGNOSIS — I213 ST elevation (STEMI) myocardial infarction of unspecified site: Secondary | ICD-10-CM | POA: Diagnosis not present

## 2016-01-16 DIAGNOSIS — Z955 Presence of coronary angioplasty implant and graft: Secondary | ICD-10-CM | POA: Diagnosis not present

## 2016-01-16 NOTE — Progress Notes (Signed)
Cardiac Individual Treatment Plan  Patient Details  Name: Jose Ware MRN: [id number] Date of Birth: [birth year] Referring Provider:   Flowsheet Row CARDIAC REHAB PHASE II ORIENTATION from 12/13/2015 in [place]  Referring Provider  Martinique, Peter MD      Initial Encounter Date:  [place] PHASE II ORIENTATION from 12/13/2015 in [place]  Date  12/13/15  Referring Provider  Martinique, Peter MD      Visit Diagnosis: 11/04/15 ST elevation myocardial infarction involving left circumflex coronary artery ([place])  11/06/15 Status post coronary artery stent placement  Patient's Home Medications on Admission:  Current Outpatient Prescriptions:  .  acetaminophen (TYLENOL) 325 MG tablet, Take 2 tablets (650 mg total) by mouth every 4 (four) hours as needed for headache or mild pain. (Patient taking differently: Take 325 mg by mouth every 4 (four) hours as needed for headache or mild pain. ), Disp: , Rfl:  .  aspirin EC 81 MG tablet, Take 1 tablet (81 mg total) by mouth daily., Disp: 90 tablet, Rfl: 1 .  atorvastatin (LIPITOR) 80 MG tablet, Take 1 tablet (80 mg total) by mouth daily at 6 PM., Disp: 90 tablet, Rfl: 3 .  beta carotene w/minerals (OCUVITE) tablet, Take 1 tablet by mouth daily., Disp: , Rfl:  .  carvedilol (COREG) 6.25 MG tablet, Take 1 tablet (6.25 mg total) by mouth 2 (two) times daily with a meal., Disp: 180 tablet, Rfl: 3 .  Cyanocobalamin (VITAMIN B-12) 5000 MCG TBDP, Take 5,000 mcg by mouth daily., Disp: , Rfl:  .  losartan (COZAAR) 25 MG tablet, Take 1 tablet (25 mg total) by mouth daily., Disp: 30 tablet, Rfl: 3 .  nitroGLYCERIN (NITROSTAT) 0.4 MG SL tablet, Place 1 tablet (0.4 mg total) under the tongue every 5 (five) minutes x 3 doses as needed for chest pain., Disp: 25 tablet, Rfl: 2 .  pantoprazole (PROTONIX) 40 MG tablet, Take 1 tablet (40 mg total) by mouth daily. (Patient taking  differently: Take 40 mg by mouth daily as needed. ), Disp: 30 tablet, Rfl: 3 .  spironolactone (ALDACTONE) 25 MG tablet, Take 1 tablet (25 mg total) by mouth daily., Disp: 90 tablet, Rfl: 3 .  ticagrelor (BRILINTA) 90 MG TABS tablet, Take 1 tablet (90 mg total) by mouth 2 (two) times daily., Disp: 180 tablet, Rfl: 3  Past Medical History: Past Medical History:  Diagnosis Date  . Adenocarcinoma of prostate [facility])    XRT & Radiation seed implantation in 2010  . Adenomatous colon polyp   . CAD (coronary artery disease)    a. 11/04/15:  Acute inferolateral STEMI: S/p emergent DES of a very large LCx with extensive thrombus. Significant residual disease in the proximal LAD and distal RCA. S/p staged PCI of LAD and RCA 8/29.  [place] CVA (cerebral infarction)    a. 123456: embolic CVA after heart cath   . CVA (cerebral infarction)    a. 123456: embolic CVA after heart cath   . DIVERTICULITIS, HX OF 11/27/2006       . DJD (degenerative joint disease)    wrist - R   . Hernia   . History of blood transfusion 1985   with colon surgery  . Hypertension   . Ischemic cardiomyopathy    a. EF 25-30% by LV gram on cath 11/04/15. Appeared out of proportion to infarct although infarct was large. EF improved by Echo and now 40-45%.  . NEPHROLITHIASIS, HX OF  11/27/2006        . Peripheral neuropathy ([place])   . Psoriasis   . Tenosynovitis     Tobacco Use: History  Smoking Status  . Former Smoker  . Quit date: 03/11/1983  Smokeless Tobacco  . Never Used    Labs: Recent Review Flowsheet Data    Labs for ITP Cardiac and Pulmonary Rehab Latest Ref Rng & Units 12/27/2009 12/01/2013 12/15/2014 11/04/2015 11/05/2015   Cholestrol 0 - 200 mg/dL 131 - 104 - 98   LDLCALC 0 - 99 mg/dL 83 - 59 - 51   LDLDIRECT mg/dL - 68.1 - - -   HDL >40 mg/dL 33.70(L) - 32.00(L) - 29(L)   Trlycerides <150 mg/dL 72.0 - 64.0 - 88   Hemoglobin A1c 4.8 - 5.6 % - - - 5.3 -      Capillary Blood Glucose: No results found for:  GLUCAP   Exercise Target Goals:    Exercise Program Goal: Individual exercise prescription set with THRR, safety & activity barriers. Participant demonstrates ability to understand and report RPE using BORG scale, to self-measure pulse accurately, and to acknowledge the importance of the exercise prescription.  Exercise Prescription Goal: Starting with aerobic activity 30 plus minutes a day, 3 days per week for initial exercise prescription. Provide home exercise prescription and guidelines that participant acknowledges understanding prior to discharge.  Activity Barriers & Risk Stratification:     Activity Barriers & Cardiac Risk Stratification - 12/13/15 0941      Activity Barriers & Cardiac Risk Stratification   Activity Barriers Balance Concerns;Muscular Weakness;Deconditioning  R wrist extension limitation/restrictions   Cardiac Risk Stratification High      6 Minute Walk:     6 Minute Walk    Row Name 12/13/15 1339         6 Minute Walk   Phase Initial     Distance 1177 feet     Walk Time 6 minutes     # of Rest Breaks 0     MPH 2.2     METS 1.73     RPE 9     VO2 Peak 6.08     Symptoms No     Resting HR 54 bpm     Resting BP 108/60     Max Ex. HR 72 bpm     Max Ex. BP 112/60     2 Minute Post BP 114/60        Initial Exercise Prescription:     Initial Exercise Prescription - 12/13/15 1300      Date of Initial Exercise RX and Referring Provider   Date 12/13/15   Referring Provider Martinique, Peter MD     Recumbant Bike   Level 1   Minutes 10   METs 1.3     NuStep   Level 2   Minutes 10   METs 2     Track   Laps 8   Minutes 10   METs 2.39     Prescription Details   Frequency (times per week) 3   Duration Progress to 30 minutes of continuous aerobic without signs/symptoms of physical distress     Intensity   THRR 40-80% of Max Heartrate 56-113   Ratings of Perceived Exertion 11-13   Perceived Dyspnea 0-4     Progression    Progression Continue to progress workloads to maintain intensity without signs/symptoms of physical distress.     Resistance Training   Training Prescription Yes   Weight 1lb  Reps 10-12      Perform Capillary Blood Glucose checks as needed.  Exercise Prescription Changes:     Exercise Prescription Changes    Row Name 12/17/15 1600 01/15/16 1600           Exercise Review   Progression Yes Yes        Response to Exercise   Blood Pressure (Admit) 130/74 138/76      Blood Pressure (Exercise) 144/70 140/64      Blood Pressure (Exit) 140/70 128/70      Heart Rate (Admit) 61 bpm 62 bpm      Heart Rate (Exercise) 79 bpm 92 bpm      Heart Rate (Exit) 60 bpm 57 bpm      Rating of Perceived Exertion (Exercise) 11 12      Duration Progress to 30 minutes of continuous aerobic without signs/symptoms of physical distress Progress to 30 minutes of continuous aerobic without signs/symptoms of physical distress      Intensity THRR unchanged THRR unchanged        Progression   Progression Continue to progress workloads to maintain intensity without signs/symptoms of physical distress. Continue to progress workloads to maintain intensity without signs/symptoms of physical distress.      Average METs 2.5 3.5        Resistance Training   Training Prescription Yes Yes      Weight 2lbs 2lbs      Reps 10-12 10-12        Recumbant Bike   Level 2.5  -      Minutes 10  -      METs 2  -        NuStep   Level 2 4      Minutes 10 15      METs 3.3 4        Track   Laps 8 18      Minutes 10 15      METs 2.39 4.14        Home Exercise Plan   Plans to continue exercise at  - Home  Reviewed on 12/24/15      Frequency  - Add 2 additional days to program exercise sessions.         Exercise Comments:     Exercise Comments    Row Name 12/17/15 1638 01/15/16 1639         Exercise Comments Pt is tolerating exercise very well; there are no changes to current Ex. Rx, will continue to  monitor pt's progression and activity levels. Reviewed METs and goals. Pt is tolerating exercise wel; will continue to monitor pt's progression and activity levels.         Discharge Exercise Prescription (Final Exercise Prescription Changes):     Exercise Prescription Changes - 01/15/16 1600      Exercise Review   Progression Yes     Response to Exercise   Blood Pressure (Admit) 138/76   Blood Pressure (Exercise) 140/64   Blood Pressure (Exit) 128/70   Heart Rate (Admit) 62 bpm   Heart Rate (Exercise) 92 bpm   Heart Rate (Exit) 57 bpm   Rating of Perceived Exertion (Exercise) 12   Duration Progress to 30 minutes of continuous aerobic without signs/symptoms of physical distress   Intensity THRR unchanged     Progression   Progression Continue to progress workloads to maintain intensity without signs/symptoms of physical distress.   Average METs 3.5     Resistance Training  Training Prescription Yes   Weight 2lbs   Reps 10-12     NuStep   Level 4   Minutes 15   METs 4     Track   Laps 18   Minutes 15   METs 4.14     Home Exercise Plan   Plans to continue exercise at Home  Reviewed on 12/24/15   Frequency Add 2 additional days to program exercise sessions.      Nutrition:  Target Goals: Understanding of nutrition guidelines, daily intake of sodium 1500mg , cholesterol 200mg , calories 30% from fat and 7% or less from saturated fats, daily to have 5 or more servings of fruits and vegetables.  Biometrics:     Pre Biometrics - 12/13/15 1336      Pre Biometrics   Waist Circumference 43 inches   Hip Circumference 42.5 inches   Waist to Hip Ratio 1.01 %   Triceps Skinfold 20 mm   % Body Fat 31 %   Grip Strength 33 kg   Flexibility 12 in   Single Leg Stand 0.87 seconds       Nutrition Therapy Plan and Nutrition Goals:     Nutrition Therapy & Goals - 12/13/15 1049      Nutrition Therapy   Diet Therapeutic Lifestyle Changes     Intervention Plan    Intervention Prescribe, educate and counsel regarding individualized specific dietary modifications aiming towards targeted core components such as weight, hypertension, lipid management, diabetes, heart failure and other comorbidities.   Expected Outcomes Short Term Goal: Understand basic principles of dietary content, such as calories, fat, sodium, cholesterol and nutrients.;Long Term Goal: Adherence to prescribed nutrition plan.      Nutrition Discharge: Nutrition Scores:     Nutrition Assessments - 12/13/15 1049      MEDFICTS Scores   Pre Score 60      Nutrition Goals Re-Evaluation:   Psychosocial: Target Goals: Acknowledge presence or absence of depression, maximize coping skills, provide positive support system. Participant is able to verbalize types and ability to use techniques and skills needed for reducing stress and depression.  Initial Review & Psychosocial Screening:     Initial Psych Review & Screening - 12/19/15 [place]? Yes     Barriers   Psychosocial barriers to participate in program There are no identifiable barriers or psychosocial needs.     Screening Interventions   Interventions Encouraged to exercise      Quality of Life Scores:     Quality of Life - 12/13/15 1334      Quality of Life Scores   Health/Function Pre 27.5 %   Socioeconomic Pre 26.25 %   Psych/Spiritual Pre 27.86 %   Family Pre 29 %   GLOBAL Pre 27.5 %      PHQ-9: Recent Review Flowsheet Data    Depression screen [facility] 2/9 02/27/2014   Decreased Interest 0   Down, Depressed, Hopeless 0   PHQ - 2 Score 0      Psychosocial Evaluation and Intervention:     Psychosocial Evaluation - 12/19/15 1647      Psychosocial Evaluation & Interventions   Interventions Stress management education;Relaxation education;Encouraged to exercise with the program and follow exercise prescription   Comments no psychosocial needs identified, no  inteventions necessary    Continued Psychosocial Services Needed No      Psychosocial Re-Evaluation:     Psychosocial Re-Evaluation    [place] Name 01/15/16  0953             Psychosocial Re-Evaluation   Interventions Encouraged to attend Cardiac Rehabilitation for the exercise       Comments no psychosocial needs identified, no interventions necessary.        Continued Psychosocial Services Needed No          Vocational Rehabilitation: Provide vocational rehab assistance to qualifying candidates.   Vocational Rehab Evaluation & Intervention:     Vocational Rehab - 12/14/15 1306      Initial Vocational Rehab Evaluation & Intervention   Assessment shows need for Vocational Rehabilitation No  pt does not plan to return to competive employment. Pt is retired      Education: Education Goals: Education classes will be provided on a weekly basis, covering required topics. Participant will state understanding/return demonstration of topics presented.  Learning Barriers/Preferences:     Learning Barriers/Preferences - 12/13/15 [id number]      Learning Barriers/Preferences   Learning Barriers Sight  readers   Learning Preferences Written Material;Video;Verbal Instruction;Skilled Demonstration;Pictoral      Education Topics: Count Your Pulse:  -Group instruction provided by verbal instruction, demonstration, patient participation and written materials to support subject.  Instructors address importance of being able to find your pulse and how to count your pulse when at home without a heart monitor.  Patients get hands on experience counting their pulse with staff help and individually. Flowsheet Row CARDIAC REHAB PHASE II EXERCISE from 01/16/2016 in [place]  Date  01/11/16  Educator  Barnet Pall, RN  Instruction Review Code  2- meets goals/outcomes      Heart Attack, Angina, and Risk Factor Modification:  -Group instruction provided by  verbal instruction, video, and written materials to support subject.  Instructors address signs and symptoms of angina and heart attacks.    Also discuss risk factors for heart disease and how to make changes to improve heart health risk factors. Flowsheet Row CARDIAC REHAB PHASE II EXERCISE from 01/16/2016 in [place]  Date  12/26/15  Instruction Review Code  2- meets goals/outcomes      Functional Fitness:  -Group instruction provided by verbal instruction, demonstration, patient participation, and written materials to support subject.  Instructors address safety measures for doing things around the house.  Discuss how to get up and down off the floor, how to pick things up properly, how to safely get out of a chair without assistance, and balance training.   Meditation and Mindfulness:  -Group instruction provided by verbal instruction, patient participation, and written materials to support subject.  Instructor addresses importance of mindfulness and meditation practice to help reduce stress and improve awareness.  Instructor also leads participants through a meditation exercise.  Flowsheet Row CARDIAC REHAB PHASE II EXERCISE from 01/16/2016 in [place]  Date  01/02/16  Educator  [place]  Instruction Review Code  2- meets goals/outcomes      Stretching for Flexibility and Mobility:  -Group instruction provided by verbal instruction, patient participation, and written materials to support subject.  Instructors lead participants through series of stretches that are designed to increase flexibility thus improving mobility.  These stretches are additional exercise for major muscle groups that are typically performed during regular warm up and cool down. Flowsheet Row CARDIAC REHAB PHASE II EXERCISE from 01/16/2016 in [place]  Date  01/04/16  Instruction Review Code  2- meets goals/outcomes  Hands Only CPR Anytime:  -Group instruction provided by verbal instruction, video, patient participation and written materials to support subject.  Instructors co-teach with AHA video for hands only CPR.  Participants get hands on experience with mannequins.   Nutrition I class: Heart Healthy Eating:  -Group instruction provided by PowerPoint slides, verbal discussion, and written materials to support subject matter. The instructor gives an explanation and review of the Therapeutic Lifestyle Changes diet recommendations, which includes a discussion on lipid goals, dietary fat, sodium, fiber, plant stanol/sterol esters, sugar, and the components of a well-balanced, healthy diet. Flowsheet Row CARDIAC REHAB PHASE II EXERCISE from 01/16/2016 in [place]  Date  01/15/16  Educator  RD  Instruction Review Code  2- meets goals/outcomes      Nutrition II class: Lifestyle Skills:  -Group instruction provided by PowerPoint slides, verbal discussion, and written materials to support subject matter. The instructor gives an explanation and review of label reading, grocery shopping for heart health, heart healthy recipe modifications, and ways to make healthier choices when eating out. Flowsheet Row CARDIAC REHAB PHASE II EXERCISE from 01/16/2016 in [place]  Date  12/18/15  Educator  RD  Instruction Review Code  2- meets goals/outcomes      Diabetes Question & Answer:  -Group instruction provided by PowerPoint slides, verbal discussion, and written materials to support subject matter. The instructor gives an explanation and review of diabetes co-morbidities, pre- and post-prandial blood glucose goals, pre-exercise blood glucose goals, signs, symptoms, and treatment of hypoglycemia and hyperglycemia, and foot care basics.   Diabetes Blitz:  -Group instruction provided by PowerPoint slides, verbal discussion, and written materials  to support subject matter. The instructor gives an explanation and review of the physiology behind type 1 and type 2 diabetes, diabetes medications and rational behind using different medications, pre- and post-prandial blood glucose recommendations and Hemoglobin A1c goals, diabetes diet, and exercise including blood glucose guidelines for exercising safely.    Portion Distortion:  -Group instruction provided by PowerPoint slides, verbal discussion, written materials, and food models to support subject matter. The instructor gives an explanation of serving size versus portion size, changes in portions sizes over the last 20 years, and what consists of a serving from each food group.   Stress Management:  -Group instruction provided by verbal instruction, video, and written materials to support subject matter.  Instructors review role of stress in heart disease and how to cope with stress positively.     Exercising on Your Own:  -Group instruction provided by verbal instruction, power point, and written materials to support subject.  Instructors discuss benefits of exercise, components of exercise, frequency and intensity of exercise, and end points for exercise.  Also discuss use of nitroglycerin and activating EMS.  Review options of places to exercise outside of rehab.  Review guidelines for sex with heart disease.   Cardiac Drugs I:  -Group instruction provided by verbal instruction and written materials to support subject.  Instructor reviews cardiac drug classes: antiplatelets, anticoagulants, beta blockers, and statins.  Instructor discusses reasons, side effects, and lifestyle considerations for each drug class. Flowsheet Row CARDIAC REHAB PHASE II EXERCISE from 01/16/2016 in [place]  Date  12/19/15  Instruction Review Code  2- meets goals/outcomes      Cardiac Drugs II:  -Group instruction provided by verbal instruction and written materials to  support subject.  Instructor reviews cardiac drug classes: angiotensin converting enzyme  inhibitors (ACE-I), angiotensin II receptor blockers (ARBs), nitrates, and calcium channel blockers.  Instructor discusses reasons, side effects, and lifestyle considerations for each drug class. Flowsheet Row CARDIAC REHAB PHASE II EXERCISE from 01/16/2016 in [place]  Date  01/16/16  Educator  pharmacist  Instruction Review Code  2- meets goals/outcomes      Anatomy and Physiology of the Circulatory System:  -Group instruction provided by verbal instruction, video, and written materials to support subject.  Reviews functional anatomy of heart, how it relates to various diagnoses, and what role the heart plays in the overall system.   Knowledge Questionnaire Score:     Knowledge Questionnaire Score - 12/13/15 1333      Knowledge Questionnaire Score   Pre Score 23/24      Core Components/Risk Factors/Patient Goals at Admission:     Personal Goals and Risk Factors at Admission - 12/13/15 0942      Core Components/Risk Factors/Patient Goals on Admission    Weight Management Yes;Weight Loss;Weight Maintenance   Intervention Weight Management: Develop a combined nutrition and exercise program designed to reach desired caloric intake, while maintaining appropriate intake of nutrient and fiber, sodium and fats, and appropriate energy expenditure required for the weight goal.;Weight Management: Provide education and appropriate resources to help participant work on and attain dietary goals.;Weight Management/Obesity: Establish reasonable short term and long term weight goals.;Obesity: Provide education and appropriate resources to help participant work on and attain dietary goals.   Expected Outcomes Short Term: Continue to assess and modify interventions until short term weight is achieved;Weight Maintenance: Understanding of the daily nutrition guidelines, which includes  25-35% calories from fat, 7% or less cal from saturated fats, less than 200mg  cholesterol, less than 1.5gm of sodium, & 5 or more servings of fruits and vegetables daily;Long Term: Adherence to nutrition and physical activity/exercise program aimed toward attainment of established weight goal;Weight Loss: Understanding of general recommendations for a balanced deficit meal plan, which promotes 1-2 lb weight loss per week and includes a negative energy balance of [phone] kcal/d;Understanding recommendations for meals to include 15-35% energy as protein, 25-35% energy from fat, 35-60% energy from carbohydrates, less than 200mg  of dietary cholesterol, 20-35 gm of total fiber daily   Sedentary Yes   Intervention Provide advice, education, support and counseling about physical activity/exercise needs.;Develop an individualized exercise prescription for aerobic and resistive training based on initial evaluation findings, risk stratification, comorbidities and participant's personal goals.   Expected Outcomes Achievement of increased cardiorespiratory fitness and enhanced flexibility, muscular endurance and strength shown through measurements of functional capacity and personal statement of participant.   Increase Strength and Stamina Yes   Intervention Provide advice, education, support and counseling about physical activity/exercise needs.;Develop an individualized exercise prescription for aerobic and resistive training based on initial evaluation findings, risk stratification, comorbidities and participant's personal goals.   Expected Outcomes Achievement of increased cardiorespiratory fitness and enhanced flexibility, muscular endurance and strength shown through measurements of functional capacity and personal statement of participant.   Improve shortness of breath with ADL's Yes   Intervention Provide education, individualized exercise plan and daily activity instruction to help decrease symptoms of SOB  with activities of daily living.   Expected Outcomes Short Term: Achieves a reduction of symptoms when performing activities of daily living.   Hypertension Yes   Intervention Provide education on lifestyle modifcations including regular physical activity/exercise, weight management, moderate sodium restriction and increased consumption of fresh fruit, vegetables, and low fat dairy, alcohol  moderation, and smoking cessation.;Monitor prescription use compliance.   Expected Outcomes Short Term: Continued assessment and intervention until BP is < 140/38mm HG in hypertensive participants. < 130/78mm HG in hypertensive participants with diabetes, heart failure or chronic kidney disease.;Long Term: Maintenance of blood pressure at goal levels.   Personal Goal Other Yes   Personal Goal short: return to yardwork and improve energy levels   long: lose and maintain weighloss and be able to see grand-daughter graduate from [employer] exercise prescription to improve cardiovascular fitness and nutiriton counseling to aid in weighloss   Expected Outcomes Pt will lose and maintain weightloss. Pt will also improve cardiovascular fitness and establish an exercise routine      Core Components/Risk Factors/Patient Goals Review:      Goals and Risk Factor Review    Row Name 01/15/16 1640             Core Components/Risk Factors/Patient Goals Review   Personal Goals Review Weight Management/Obesity;Other       Review pt weight is fluctuating. Referred pt to Nutritionist to assist with dietary changes to meet weightloss goals.       Expected Outcomes Pt will increase activity levels and follow nutrition plan/advice to assist with losing wt.          Core Components/Risk Factors/Patient Goals at Discharge (Final Review):      Goals and Risk Factor Review - 01/15/16 1640      Core Components/Risk Factors/Patient Goals Review   Personal Goals Review Weight Management/Obesity;Other   Review  pt weight is fluctuating. Referred pt to Nutritionist to assist with dietary changes to meet weightloss goals.   Expected Outcomes Pt will increase activity levels and follow nutrition plan/advice to assist with losing wt.      ITP Comments:     ITP Comments    Row Name 12/13/15 [phone]           ITP Comments Dr. Fransico Him, Medical Director          Comments: Pt is making expected progress toward personal goals after completing 12 sessions. Recommend continued exercise and life style modification education including  stress management and relaxation techniques to decrease cardiac risk profile.

## 2016-01-18 ENCOUNTER — Encounter (HOSPITAL_COMMUNITY)
Admission: RE | Admit: 2016-01-18 | Discharge: 2016-01-18 | Disposition: A | Discharge status: Home / Self Care | Payer: Medicare Other | Source: Ambulatory Visit | Attending: Cardiology | Admitting: Cardiology

## 2016-01-18 ENCOUNTER — Encounter (HOSPITAL_COMMUNITY): Payer: Medicare Other

## 2016-01-18 DIAGNOSIS — I2121 ST elevation (STEMI) myocardial infarction involving left circumflex coronary artery: Secondary | ICD-10-CM

## 2016-01-18 DIAGNOSIS — Z955 Presence of coronary angioplasty implant and graft: Secondary | ICD-10-CM | POA: Diagnosis not present

## 2016-01-18 DIAGNOSIS — I213 ST elevation (STEMI) myocardial infarction of unspecified site: Secondary | ICD-10-CM | POA: Diagnosis not present

## 2016-01-21 ENCOUNTER — Encounter (HOSPITAL_COMMUNITY)
Admission: RE | Admit: 2016-01-21 | Discharge: 2016-01-21 | Disposition: A | Discharge status: Home / Self Care | Payer: Medicare Other | Source: Ambulatory Visit | Attending: Cardiology | Admitting: Cardiology

## 2016-01-21 ENCOUNTER — Encounter (HOSPITAL_COMMUNITY): Payer: Medicare Other

## 2016-01-21 DIAGNOSIS — Z955 Presence of coronary angioplasty implant and graft: Secondary | ICD-10-CM | POA: Diagnosis not present

## 2016-01-21 DIAGNOSIS — I213 ST elevation (STEMI) myocardial infarction of unspecified site: Secondary | ICD-10-CM | POA: Diagnosis not present

## 2016-01-21 DIAGNOSIS — I2121 ST elevation (STEMI) myocardial infarction involving left circumflex coronary artery: Secondary | ICD-10-CM

## 2016-01-22 DIAGNOSIS — C61 Malignant neoplasm of prostate: Secondary | ICD-10-CM | POA: Diagnosis not present

## 2016-01-22 LAB — PSA: PSA: <0.015

## 2016-01-23 ENCOUNTER — Encounter (HOSPITAL_COMMUNITY)
Admission: RE | Admit: 2016-01-23 | Discharge: 2016-01-23 | Disposition: A | Discharge status: Home / Self Care | Payer: Medicare Other | Source: Ambulatory Visit | Attending: Cardiology | Admitting: Cardiology

## 2016-01-23 ENCOUNTER — Encounter (HOSPITAL_COMMUNITY): Payer: Medicare Other

## 2016-01-23 DIAGNOSIS — I213 ST elevation (STEMI) myocardial infarction of unspecified site: Secondary | ICD-10-CM | POA: Diagnosis not present

## 2016-01-23 DIAGNOSIS — Z955 Presence of coronary angioplasty implant and graft: Secondary | ICD-10-CM

## 2016-01-23 DIAGNOSIS — I2121 ST elevation (STEMI) myocardial infarction involving left circumflex coronary artery: Secondary | ICD-10-CM

## 2016-01-25 ENCOUNTER — Encounter (HOSPITAL_COMMUNITY)
Admission: RE | Admit: 2016-01-25 | Discharge: 2016-01-25 | Disposition: A | Discharge status: Home / Self Care | Payer: Medicare Other | Source: Ambulatory Visit | Attending: Cardiology | Admitting: Cardiology

## 2016-01-25 ENCOUNTER — Encounter (HOSPITAL_COMMUNITY): Payer: Medicare Other

## 2016-01-25 DIAGNOSIS — I213 ST elevation (STEMI) myocardial infarction of unspecified site: Secondary | ICD-10-CM | POA: Diagnosis not present

## 2016-01-25 DIAGNOSIS — Z955 Presence of coronary angioplasty implant and graft: Secondary | ICD-10-CM | POA: Diagnosis not present

## 2016-01-28 ENCOUNTER — Encounter (HOSPITAL_COMMUNITY): Payer: Medicare Other

## 2016-01-28 ENCOUNTER — Encounter (HOSPITAL_COMMUNITY)
Admission: RE | Admit: 2016-01-28 | Discharge: 2016-01-28 | Disposition: A | Discharge status: Home / Self Care | Payer: Medicare Other | Source: Ambulatory Visit | Attending: Cardiology | Admitting: Cardiology

## 2016-01-28 DIAGNOSIS — I213 ST elevation (STEMI) myocardial infarction of unspecified site: Secondary | ICD-10-CM | POA: Diagnosis not present

## 2016-01-28 DIAGNOSIS — Z955 Presence of coronary angioplasty implant and graft: Secondary | ICD-10-CM

## 2016-01-28 DIAGNOSIS — I2121 ST elevation (STEMI) myocardial infarction involving left circumflex coronary artery: Secondary | ICD-10-CM

## 2016-01-28 DIAGNOSIS — R3121 Asymptomatic microscopic hematuria: Secondary | ICD-10-CM | POA: Diagnosis not present

## 2016-01-30 ENCOUNTER — Encounter (HOSPITAL_COMMUNITY)
Admission: RE | Admit: 2016-01-30 | Discharge: 2016-01-30 | Disposition: A | Discharge status: Home / Self Care | Payer: Medicare Other | Source: Ambulatory Visit | Attending: Cardiology | Admitting: Cardiology

## 2016-01-30 ENCOUNTER — Encounter (HOSPITAL_COMMUNITY): Payer: Medicare Other

## 2016-01-30 DIAGNOSIS — I2121 ST elevation (STEMI) myocardial infarction involving left circumflex coronary artery: Secondary | ICD-10-CM

## 2016-01-30 DIAGNOSIS — Z955 Presence of coronary angioplasty implant and graft: Secondary | ICD-10-CM | POA: Diagnosis not present

## 2016-01-30 DIAGNOSIS — I213 ST elevation (STEMI) myocardial infarction of unspecified site: Secondary | ICD-10-CM | POA: Diagnosis not present

## 2016-02-04 ENCOUNTER — Encounter (HOSPITAL_COMMUNITY)
Admission: RE | Admit: 2016-02-04 | Discharge: 2016-02-04 | Disposition: A | Discharge status: Home / Self Care | Payer: Medicare Other | Source: Ambulatory Visit | Attending: Cardiology | Admitting: Cardiology

## 2016-02-04 ENCOUNTER — Encounter (HOSPITAL_COMMUNITY): Payer: Medicare Other

## 2016-02-04 DIAGNOSIS — I213 ST elevation (STEMI) myocardial infarction of unspecified site: Secondary | ICD-10-CM | POA: Diagnosis not present

## 2016-02-04 DIAGNOSIS — Z955 Presence of coronary angioplasty implant and graft: Secondary | ICD-10-CM

## 2016-02-04 DIAGNOSIS — I2121 ST elevation (STEMI) myocardial infarction involving left circumflex coronary artery: Secondary | ICD-10-CM

## 2016-02-06 ENCOUNTER — Encounter (HOSPITAL_COMMUNITY)
Admission: RE | Admit: 2016-02-06 | Discharge: 2016-02-06 | Disposition: A | Discharge status: Home / Self Care | Payer: Medicare Other | Source: Ambulatory Visit | Attending: Cardiology | Admitting: Cardiology

## 2016-02-06 ENCOUNTER — Encounter (HOSPITAL_COMMUNITY): Payer: Medicare Other

## 2016-02-06 DIAGNOSIS — Z955 Presence of coronary angioplasty implant and graft: Secondary | ICD-10-CM | POA: Diagnosis not present

## 2016-02-06 DIAGNOSIS — I213 ST elevation (STEMI) myocardial infarction of unspecified site: Secondary | ICD-10-CM | POA: Diagnosis not present

## 2016-02-06 DIAGNOSIS — I2121 ST elevation (STEMI) myocardial infarction involving left circumflex coronary artery: Secondary | ICD-10-CM

## 2016-02-08 ENCOUNTER — Encounter (HOSPITAL_COMMUNITY)
Admission: RE | Admit: 2016-02-08 | Discharge: 2016-02-08 | Disposition: A | Discharge status: Home / Self Care | Payer: Medicare Other | Source: Ambulatory Visit | Attending: Cardiology | Admitting: Cardiology

## 2016-02-08 ENCOUNTER — Encounter (HOSPITAL_COMMUNITY): Payer: Medicare Other

## 2016-02-08 DIAGNOSIS — Z955 Presence of coronary angioplasty implant and graft: Secondary | ICD-10-CM | POA: Diagnosis not present

## 2016-02-08 DIAGNOSIS — I213 ST elevation (STEMI) myocardial infarction of unspecified site: Secondary | ICD-10-CM | POA: Diagnosis not present

## 2016-02-08 DIAGNOSIS — I2121 ST elevation (STEMI) myocardial infarction involving left circumflex coronary artery: Secondary | ICD-10-CM

## 2016-02-11 ENCOUNTER — Encounter (HOSPITAL_COMMUNITY)
Admission: RE | Admit: 2016-02-11 | Discharge: 2016-02-11 | Disposition: A | Discharge status: Home / Self Care | Payer: Medicare Other | Source: Ambulatory Visit | Attending: Cardiology | Admitting: Cardiology

## 2016-02-11 ENCOUNTER — Encounter (HOSPITAL_COMMUNITY): Payer: Medicare Other

## 2016-02-11 DIAGNOSIS — I213 ST elevation (STEMI) myocardial infarction of unspecified site: Secondary | ICD-10-CM | POA: Diagnosis not present

## 2016-02-11 DIAGNOSIS — Z955 Presence of coronary angioplasty implant and graft: Secondary | ICD-10-CM | POA: Diagnosis not present

## 2016-02-11 DIAGNOSIS — I2121 ST elevation (STEMI) myocardial infarction involving left circumflex coronary artery: Secondary | ICD-10-CM

## 2016-02-13 ENCOUNTER — Encounter (HOSPITAL_COMMUNITY): Payer: Medicare Other

## 2016-02-13 ENCOUNTER — Encounter (HOSPITAL_COMMUNITY)
Admission: RE | Admit: 2016-02-13 | Discharge: 2016-02-13 | Disposition: A | Discharge status: Home / Self Care | Payer: Medicare Other | Source: Ambulatory Visit | Attending: Cardiology | Admitting: Cardiology

## 2016-02-13 DIAGNOSIS — Z955 Presence of coronary angioplasty implant and graft: Secondary | ICD-10-CM

## 2016-02-13 DIAGNOSIS — I2121 ST elevation (STEMI) myocardial infarction involving left circumflex coronary artery: Secondary | ICD-10-CM

## 2016-02-13 DIAGNOSIS — I213 ST elevation (STEMI) myocardial infarction of unspecified site: Secondary | ICD-10-CM | POA: Diagnosis not present

## 2016-02-15 ENCOUNTER — Encounter (HOSPITAL_COMMUNITY)
Admission: RE | Admit: 2016-02-15 | Discharge: 2016-02-15 | Disposition: A | Discharge status: Home / Self Care | Payer: Medicare Other | Source: Ambulatory Visit | Attending: Cardiology | Admitting: Cardiology

## 2016-02-15 ENCOUNTER — Encounter (HOSPITAL_COMMUNITY): Payer: Medicare Other

## 2016-02-15 DIAGNOSIS — I213 ST elevation (STEMI) myocardial infarction of unspecified site: Secondary | ICD-10-CM | POA: Diagnosis not present

## 2016-02-15 DIAGNOSIS — I2121 ST elevation (STEMI) myocardial infarction involving left circumflex coronary artery: Secondary | ICD-10-CM

## 2016-02-15 DIAGNOSIS — Z955 Presence of coronary angioplasty implant and graft: Secondary | ICD-10-CM

## 2016-02-15 NOTE — Progress Notes (Signed)
Cardiac Individual Treatment Plan  Patient Details  Name: Jose Ware MRN: [id number] Date of Birth: [birth year] Referring Provider:   Flowsheet Row CARDIAC REHAB PHASE II ORIENTATION from 12/13/2015 in [place]  Referring Provider  Martinique, Peter MD      Initial Encounter Date:  [name] PHASE II ORIENTATION from 12/13/2015 in [facility]  Date  12/13/15  Referring Provider  Martinique, Peter MD      Visit Diagnosis: No diagnosis found.  Patient's Home Medications on Admission:  Current Outpatient Prescriptions:  .  acetaminophen (TYLENOL) 325 MG tablet, Take 2 tablets (650 mg total) by mouth every 4 (four) hours as needed for headache or mild pain. (Patient taking differently: Take 325 mg by mouth every 4 (four) hours as needed for headache or mild pain. ), Disp: , Rfl:  .  aspirin EC 81 MG tablet, Take 1 tablet (81 mg total) by mouth daily., Disp: 90 tablet, Rfl: 1 .  atorvastatin (LIPITOR) 80 MG tablet, Take 1 tablet (80 mg total) by mouth daily at 6 PM., Disp: 90 tablet, Rfl: 3 .  beta carotene w/minerals (OCUVITE) tablet, Take 1 tablet by mouth daily., Disp: , Rfl:  .  carvedilol (COREG) 6.25 MG tablet, Take 1 tablet (6.25 mg total) by mouth 2 (two) times daily with a meal., Disp: 180 tablet, Rfl: 3 .  Cyanocobalamin (VITAMIN B-12) 5000 MCG TBDP, Take 5,000 mcg by mouth daily., Disp: , Rfl:  .  losartan (COZAAR) 25 MG tablet, Take 1 tablet (25 mg total) by mouth daily., Disp: 30 tablet, Rfl: 3 .  nitroGLYCERIN (NITROSTAT) 0.4 MG SL tablet, Place 1 tablet (0.4 mg total) under the tongue every 5 (five) minutes x 3 doses as needed for chest pain., Disp: 25 tablet, Rfl: 2 .  pantoprazole (PROTONIX) 40 MG tablet, Take 1 tablet (40 mg total) by mouth daily. (Patient taking differently: Take 40 mg by mouth daily as needed. ), Disp: 30 tablet, Rfl: 3 .  spironolactone (ALDACTONE) 25 MG tablet, Take 1  tablet (25 mg total) by mouth daily., Disp: 90 tablet, Rfl: 3 .  ticagrelor (BRILINTA) 90 MG TABS tablet, Take 1 tablet (90 mg total) by mouth 2 (two) times daily., Disp: 180 tablet, Rfl: 3  Past Medical History: Past Medical History:  Diagnosis Date  . Adenocarcinoma of prostate [facility])    XRT & Radiation seed implantation in 2010  . Adenomatous colon polyp   . CAD (coronary artery disease)    a. 11/04/15:  Acute inferolateral STEMI: S/p emergent DES of a very large LCx with extensive thrombus. Significant residual disease in the proximal LAD and distal RCA. S/p staged PCI of LAD and RCA 8/29.  [place] CVA (cerebral infarction)    a. 123456: embolic CVA after heart cath   . CVA (cerebral infarction)    a. 123456: embolic CVA after heart cath   . DIVERTICULITIS, HX OF 11/27/2006       . DJD (degenerative joint disease)    wrist - R   . Hernia   . History of blood transfusion 1985   with colon surgery  . Hypertension   . Ischemic cardiomyopathy    a. EF 25-30% by LV gram on cath 11/04/15. Appeared out of proportion to infarct although infarct was large. EF improved by Echo and now 40-45%.  . NEPHROLITHIASIS, HX OF 11/27/2006        . Peripheral neuropathy ([place])   . Psoriasis   .  Tenosynovitis     Tobacco Use: History  Smoking Status  . Former Smoker  . Quit date: 03/11/1983  Smokeless Tobacco  . Never Used    Labs: Recent Review Flowsheet Data    Labs for ITP Cardiac and Pulmonary Rehab Latest Ref Rng & Units 12/27/2009 12/01/2013 12/15/2014 11/04/2015 11/05/2015   Cholestrol 0 - 200 mg/dL 131 - 104 - 98   LDLCALC 0 - 99 mg/dL 83 - 59 - 51   LDLDIRECT mg/dL - 68.1 - - -   HDL >40 mg/dL 33.70(L) - 32.00(L) - 29(L)   Trlycerides <150 mg/dL 72.0 - 64.0 - 88   Hemoglobin A1c 4.8 - 5.6 % - - - 5.3 -      Capillary Blood Glucose: No results found for: GLUCAP   Exercise Target Goals:    Exercise Program Goal: Individual exercise prescription set with THRR, safety & activity  barriers. Participant demonstrates ability to understand and report RPE using BORG scale, to self-measure pulse accurately, and to acknowledge the importance of the exercise prescription.  Exercise Prescription Goal: Starting with aerobic activity 30 plus minutes a day, 3 days per week for initial exercise prescription. Provide home exercise prescription and guidelines that participant acknowledges understanding prior to discharge.  Activity Barriers & Risk Stratification:     Activity Barriers & Cardiac Risk Stratification - 12/13/15 0941      Activity Barriers & Cardiac Risk Stratification   Activity Barriers Balance Concerns;Muscular Weakness;Deconditioning  R wrist extension limitation/restrictions   Cardiac Risk Stratification High      6 Minute Walk:     6 Minute Walk    Row Name 12/13/15 1339         6 Minute Walk   Phase Initial     Distance 1177 feet     Walk Time 6 minutes     # of Rest Breaks 0     MPH 2.2     METS 1.73     RPE 9     VO2 Peak 6.08     Symptoms No     Resting HR 54 bpm     Resting BP 108/60     Max Ex. HR 72 bpm     Max Ex. BP 112/60     2 Minute Post BP 114/60        Initial Exercise Prescription:     Initial Exercise Prescription - 12/13/15 1300      Date of Initial Exercise RX and Referring Provider   Date 12/13/15   Referring Provider Martinique, Peter MD     Recumbant Bike   Level 1   Minutes 10   METs 1.3     NuStep   Level 2   Minutes 10   METs 2     Track   Laps 8   Minutes 10   METs 2.39     Prescription Details   Frequency (times per week) 3   Duration Progress to 30 minutes of continuous aerobic without signs/symptoms of physical distress     Intensity   THRR 40-80% of Max Heartrate 56-113   Ratings of Perceived Exertion 11-13   Perceived Dyspnea 0-4     Progression   Progression Continue to progress workloads to maintain intensity without signs/symptoms of physical distress.     Resistance Training    Training Prescription Yes   Weight 1lb   Reps 10-12      Perform Capillary Blood Glucose checks as needed.  Exercise Prescription Changes:  Exercise Prescription Changes    Row Name 12/17/15 1600 01/15/16 1600 02/13/16 1000         Exercise Review   Progression Yes Yes Yes       Response to Exercise   Blood Pressure (Admit) 130/74 138/76 144/80     Blood Pressure (Exercise) 144/70 140/64 150/76     Blood Pressure (Exit) 140/70 128/70 132/84     Heart Rate (Admit) 61 bpm 62 bpm 70 bpm     Heart Rate (Exercise) 79 bpm 92 bpm 88 bpm     Heart Rate (Exit) 60 bpm 57 bpm 61 bpm     Rating of Perceived Exertion (Exercise) 11 12 11      Comments  -  - none     Duration Progress to 30 minutes of continuous aerobic without signs/symptoms of physical distress Progress to 30 minutes of continuous aerobic without signs/symptoms of physical distress Progress to 30 minutes of continuous aerobic without signs/symptoms of physical distress     Intensity THRR unchanged THRR unchanged THRR unchanged       Progression   Progression Continue to progress workloads to maintain intensity without signs/symptoms of physical distress. Continue to progress workloads to maintain intensity without signs/symptoms of physical distress. Continue to progress workloads to maintain intensity without signs/symptoms of physical distress.     Average METs 2.5 3.5 2.9       Resistance Training   Training Prescription Yes Yes Yes     Weight 2lbs 2lbs 4lbs     Reps 10-12 10-12 10-12       Recumbant Bike   Level 2.5  - 3.5     Minutes 10  - 15     METs 2  - 2.9       NuStep   Level 2 4  -     Minutes 10 15  -     METs 3.3 4  -       Track   Laps 8 18 17      Minutes 10 15 15      METs 2.39 4.14 2.97       Home Exercise Plan   Plans to continue exercise at  - Home  Reviewed on 12/24/15 Home  Reviewed on 12/24/15     Frequency  - Add 2 additional days to program exercise sessions. Add 2 additional days  to program exercise sessions.        Exercise Comments:     Exercise Comments    Row Name 12/17/15 1638 01/15/16 1639 02/13/16 1015       Exercise Comments Pt is tolerating exercise very well; there are no changes to current Ex. Rx, will continue to monitor pt's progression and activity levels. Reviewed METs and goals. Pt is tolerating exercise wel; will continue to monitor pt's progression and activity levels. Reviewed METs and goals. Pt is tolerating exercise wel; will continue to monitor pt's progression and activity levels.        Discharge Exercise Prescription (Final Exercise Prescription Changes):     Exercise Prescription Changes - 02/13/16 1000      Exercise Review   Progression Yes     Response to Exercise   Blood Pressure (Admit) 144/80   Blood Pressure (Exercise) 150/76   Blood Pressure (Exit) 132/84   Heart Rate (Admit) 70 bpm   Heart Rate (Exercise) 88 bpm   Heart Rate (Exit) 61 bpm   Rating of Perceived Exertion (Exercise) 11   Comments none  Duration Progress to 30 minutes of continuous aerobic without signs/symptoms of physical distress   Intensity THRR unchanged     Progression   Progression Continue to progress workloads to maintain intensity without signs/symptoms of physical distress.   Average METs 2.9     Resistance Training   Training Prescription Yes   Weight 4lbs   Reps 10-12     Recumbant Bike   Level 3.5   Minutes 15   METs 2.9     Track   Laps 17   Minutes 15   METs 2.97     Home Exercise Plan   Plans to continue exercise at Home  Reviewed on 12/24/15   Frequency Add 2 additional days to program exercise sessions.      Nutrition:  Target Goals: Understanding of nutrition guidelines, daily intake of sodium 1500mg , cholesterol 200mg , calories 30% from fat and 7% or less from saturated fats, daily to have 5 or more servings of fruits and vegetables.  Biometrics:     Pre Biometrics - 12/13/15 1336      Pre Biometrics    Waist Circumference 43 inches   Hip Circumference 42.5 inches   Waist to Hip Ratio 1.01 %   Triceps Skinfold 20 mm   % Body Fat 31 %   Grip Strength 33 kg   Flexibility 12 in   Single Leg Stand 0.87 seconds       Nutrition Therapy Plan and Nutrition Goals:     Nutrition Therapy & Goals - 12/13/15 1049      Nutrition Therapy   Diet Therapeutic Lifestyle Changes     Intervention Plan   Intervention Prescribe, educate and counsel regarding individualized specific dietary modifications aiming towards targeted core components such as weight, hypertension, lipid management, diabetes, heart failure and other comorbidities.   Expected Outcomes Short Term Goal: Understand basic principles of dietary content, such as calories, fat, sodium, cholesterol and nutrients.;Long Term Goal: Adherence to prescribed nutrition plan.      Nutrition Discharge: Nutrition Scores:     Nutrition Assessments - 12/13/15 1049      MEDFICTS Scores   Pre Score 60      Nutrition Goals Re-Evaluation:   Psychosocial: Target Goals: Acknowledge presence or absence of depression, maximize coping skills, provide positive support system. Participant is able to verbalize types and ability to use techniques and skills needed for reducing stress and depression.  Initial Review & Psychosocial Screening:     Initial Psych Review & Screening - 12/19/15 [place]? Yes     Barriers   Psychosocial barriers to participate in program There are no identifiable barriers or psychosocial needs.     Screening Interventions   Interventions Encouraged to exercise      Quality of Life Scores:     Quality of Life - 12/13/15 1334      Quality of Life Scores   Health/Function Pre 27.5 %   Socioeconomic Pre 26.25 %   Psych/Spiritual Pre 27.86 %   Family Pre 29 %   GLOBAL Pre 27.5 %      PHQ-9: Recent Review Flowsheet Data    Depression screen [facility] 2/9 02/27/2014    Decreased Interest 0   Down, Depressed, Hopeless 0   PHQ - 2 Score 0      Psychosocial Evaluation and Intervention:     Psychosocial Evaluation - 12/19/15 1647      Psychosocial Evaluation & Interventions  Interventions Stress management education;Relaxation education;Encouraged to exercise with the program and follow exercise prescription   Comments no psychosocial needs identified, no inteventions necessary    Continued Psychosocial Services Needed No      Psychosocial Re-Evaluation:     Psychosocial Re-Evaluation    Row Name 01/15/16 0953 02/12/16 1029           Psychosocial Re-Evaluation   Interventions Encouraged to attend Cardiac Rehabilitation for the exercise Encouraged to attend Cardiac Rehabilitation for the exercise      Comments no psychosocial needs identified, no interventions necessary.  no psychosocial needs identified, no interventions necessary.  pt is walking daily and is learning his limitations.       Continued Psychosocial Services Needed No No         Vocational Rehabilitation: Provide vocational rehab assistance to qualifying candidates.   Vocational Rehab Evaluation & Intervention:     Vocational Rehab - 12/14/15 1306      Initial Vocational Rehab Evaluation & Intervention   Assessment shows need for Vocational Rehabilitation No  pt does not plan to return to competive employment. Pt is retired      Education: Education Goals: Education classes will be provided on a weekly basis, covering required topics. Participant will state understanding/return demonstration of topics presented.  Learning Barriers/Preferences:     Learning Barriers/Preferences - 12/13/15 [id number]      Learning Barriers/Preferences   Learning Barriers Sight  readers   Learning Preferences Written Material;Video;Verbal Instruction;Skilled Demonstration;Pictoral      Education Topics: Count Your Pulse:  -Group instruction provided by verbal instruction,  demonstration, patient participation and written materials to support subject.  Instructors address importance of being able to find your pulse and how to count your pulse when at home without a heart monitor.  Patients get hands on experience counting their pulse with staff help and individually. Flowsheet Row CARDIAC REHAB PHASE II EXERCISE from 02/06/2016 in [place]  Date  01/11/16  Educator  Barnet Pall, RN  Instruction Review Code  2- meets goals/outcomes      Heart Attack, Angina, and Risk Factor Modification:  -Group instruction provided by verbal instruction, video, and written materials to support subject.  Instructors address signs and symptoms of angina and heart attacks.    Also discuss risk factors for heart disease and how to make changes to improve heart health risk factors. Flowsheet Row CARDIAC REHAB PHASE II EXERCISE from 02/06/2016 in [place]  Date  12/26/15  Instruction Review Code  2- meets goals/outcomes      Functional Fitness:  -Group instruction provided by verbal instruction, demonstration, patient participation, and written materials to support subject.  Instructors address safety measures for doing things around the house.  Discuss how to get up and down off the floor, how to pick things up properly, how to safely get out of a chair without assistance, and balance training. Flowsheet Row CARDIAC REHAB PHASE II EXERCISE from 02/06/2016 in [place]  Date  01/18/16  Instruction Review Code  2- meets goals/outcomes      Meditation and Mindfulness:  -Group instruction provided by verbal instruction, patient participation, and written materials to support subject.  Instructor addresses importance of mindfulness and meditation practice to help reduce stress and improve awareness.  Instructor also leads participants through a meditation exercise.  Flowsheet Row  CARDIAC REHAB PHASE II EXERCISE from 02/06/2016 in [place]  Date  01/02/16  Educator  [place]  Instruction Review Code  2- meets goals/outcomes      Stretching for Flexibility and Mobility:  -Group instruction provided by verbal instruction, patient participation, and written materials to support subject.  Instructors lead participants through series of stretches that are designed to increase flexibility thus improving mobility.  These stretches are additional exercise for major muscle groups that are typically performed during regular warm up and cool down. Flowsheet Row CARDIAC REHAB PHASE II EXERCISE from 02/06/2016 in [place]  Date  01/04/16  Instruction Review Code  2- meets goals/outcomes      Hands Only CPR Anytime:  -Group instruction provided by verbal instruction, video, patient participation and written materials to support subject.  Instructors co-teach with [facility] video for hands only CPR.  Participants get hands on experience with mannequins.   Nutrition I class: Heart Healthy Eating:  -Group instruction provided by PowerPoint slides, verbal discussion, and written materials to support subject matter. The instructor gives an explanation and review of the Therapeutic Lifestyle Changes diet recommendations, which includes a discussion on lipid goals, dietary fat, sodium, fiber, plant stanol/sterol esters, sugar, and the components of a well-balanced, healthy diet. Flowsheet Row CARDIAC REHAB PHASE II EXERCISE from 02/06/2016 in [place]  Date  01/15/16  Educator  RD  Instruction Review Code  2- meets goals/outcomes      Nutrition II class: Lifestyle Skills:  -Group instruction provided by PowerPoint slides, verbal discussion, and written materials to support subject matter. The instructor gives an explanation and review of label reading, grocery shopping for heart health,  heart healthy recipe modifications, and ways to make healthier choices when eating out. Flowsheet Row CARDIAC REHAB PHASE II EXERCISE from 02/06/2016 in [place]  Date  12/18/15  Educator  RD  Instruction Review Code  2- meets goals/outcomes      Diabetes Question & Answer:  -Group instruction provided by PowerPoint slides, verbal discussion, and written materials to support subject matter. The instructor gives an explanation and review of diabetes co-morbidities, pre- and post-prandial blood glucose goals, pre-exercise blood glucose goals, signs, symptoms, and treatment of hypoglycemia and hyperglycemia, and foot care basics.   Diabetes Blitz:  -Group instruction provided by PowerPoint slides, verbal discussion, and written materials to support subject matter. The instructor gives an explanation and review of the physiology behind type 1 and type 2 diabetes, diabetes medications and rational behind using different medications, pre- and post-prandial blood glucose recommendations and Hemoglobin A1c goals, diabetes diet, and exercise including blood glucose guidelines for exercising safely.    Portion Distortion:  -Group instruction provided by PowerPoint slides, verbal discussion, written materials, and food models to support subject matter. The instructor gives an explanation of serving size versus portion size, changes in portions sizes over the last 20 years, and what consists of a serving from each food group. Flowsheet Row CARDIAC REHAB PHASE II EXERCISE from 02/06/2016 in [place]  Date  02/06/16 [Holiday Eating Survival Tips]  Educator  RD  Instruction Review Code  2- meets goals/outcomes      Stress Management:  -Group instruction provided by verbal instruction, video, and written materials to support subject matter.  Instructors review role of stress in heart disease and how to cope with stress positively.    Flowsheet Row CARDIAC REHAB PHASE II EXERCISE from 02/06/2016 in [place]  Date  01/23/16  Instruction Review Code  2- meets goals/outcomes      Exercising on Your Own:  -Group instruction provided by verbal instruction, power point, and written materials to support subject.  Instructors discuss benefits of exercise, components of exercise, frequency and intensity of exercise, and end points for exercise.  Also discuss use of nitroglycerin and activating EMS.  Review options of places to exercise outside of rehab.  Review guidelines for sex with heart disease. Flowsheet Row CARDIAC REHAB PHASE II EXERCISE from 02/06/2016 in [place]  Date  01/25/16  Instruction Review Code  2- meets goals/outcomes      Cardiac Drugs I:  -Group instruction provided by verbal instruction and written materials to support subject.  Instructor reviews cardiac drug classes: antiplatelets, anticoagulants, beta blockers, and statins.  Instructor discusses reasons, side effects, and lifestyle considerations for each drug class. Flowsheet Row CARDIAC REHAB PHASE II EXERCISE from 02/06/2016 in [place]  Date  12/19/15  Instruction Review Code  2- meets goals/outcomes      Cardiac Drugs II:  -Group instruction provided by verbal instruction and written materials to support subject.  Instructor reviews cardiac drug classes: angiotensin converting enzyme inhibitors (ACE-I), angiotensin II receptor blockers (ARBs), nitrates, and calcium channel blockers.  Instructor discusses reasons, side effects, and lifestyle considerations for each drug class. Flowsheet Row CARDIAC REHAB PHASE II EXERCISE from 02/06/2016 in [place]  Date  01/16/16  Educator  pharmacist  Instruction Review Code  2- meets goals/outcomes      Anatomy and Physiology of the Circulatory System:  -Group instruction  provided by verbal instruction, video, and written materials to support subject.  Reviews functional anatomy of heart, how it relates to various diagnoses, and what role the heart plays in the overall system.   Knowledge Questionnaire Score:     Knowledge Questionnaire Score - 12/13/15 1333      Knowledge Questionnaire Score   Pre Score 23/24      Core Components/Risk Factors/Patient Goals at Admission:     Personal Goals and Risk Factors at Admission - 12/13/15 0942      Core Components/Risk Factors/Patient Goals on Admission    Weight Management Yes;Weight Loss;Weight Maintenance   Intervention Weight Management: Develop a combined nutrition and exercise program designed to reach desired caloric intake, while maintaining appropriate intake of nutrient and fiber, sodium and fats, and appropriate energy expenditure required for the weight goal.;Weight Management: Provide education and appropriate resources to help participant work on and attain dietary goals.;Weight Management/Obesity: Establish reasonable short term and long term weight goals.;Obesity: Provide education and appropriate resources to help participant work on and attain dietary goals.   Expected Outcomes Short Term: Continue to assess and modify interventions until short term weight is achieved;Weight Maintenance: Understanding of the daily nutrition guidelines, which includes 25-35% calories from fat, 7% or less cal from saturated fats, less than 200mg  cholesterol, less than 1.5gm of sodium, & 5 or more servings of fruits and vegetables daily;Long Term: Adherence to nutrition and physical activity/exercise program aimed toward attainment of established weight goal;Weight Loss: Understanding of general recommendations for a balanced deficit meal plan, which promotes 1-2 lb weight loss per week and includes a negative energy balance of [phone] kcal/d;Understanding recommendations for meals to include 15-35% energy as protein,  25-35% energy from fat, 35-60% energy from carbohydrates, less than 200mg  of dietary cholesterol, 20-35 gm of total fiber daily  Sedentary Yes   Intervention Provide advice, education, support and counseling about physical activity/exercise needs.;Develop an individualized exercise prescription for aerobic and resistive training based on initial evaluation findings, risk stratification, comorbidities and participant's personal goals.   Expected Outcomes Achievement of increased cardiorespiratory fitness and enhanced flexibility, muscular endurance and strength shown through measurements of functional capacity and personal statement of participant.   Increase Strength and Stamina Yes   Intervention Provide advice, education, support and counseling about physical activity/exercise needs.;Develop an individualized exercise prescription for aerobic and resistive training based on initial evaluation findings, risk stratification, comorbidities and participant's personal goals.   Expected Outcomes Achievement of increased cardiorespiratory fitness and enhanced flexibility, muscular endurance and strength shown through measurements of functional capacity and personal statement of participant.   Improve shortness of breath with ADL's Yes   Intervention Provide education, individualized exercise plan and daily activity instruction to help decrease symptoms of SOB with activities of daily living.   Expected Outcomes Short Term: Achieves a reduction of symptoms when performing activities of daily living.   Hypertension Yes   Intervention Provide education on lifestyle modifcations including regular physical activity/exercise, weight management, moderate sodium restriction and increased consumption of fresh fruit, vegetables, and low fat dairy, alcohol moderation, and smoking cessation.;Monitor prescription use compliance.   Expected Outcomes Short Term: Continued assessment and intervention until BP is < 140/65mm  HG in hypertensive participants. < 130/45mm HG in hypertensive participants with diabetes, heart failure or chronic kidney disease.;Long Term: Maintenance of blood pressure at goal levels.   Personal Goal Other Yes   Personal Goal short: return to yardwork and improve energy levels   long: lose and maintain weighloss and be able to see grand-daughter graduate from [employer] exercise prescription to improve cardiovascular fitness and nutiriton counseling to aid in weighloss   Expected Outcomes Pt will lose and maintain weightloss. Pt will also improve cardiovascular fitness and establish an exercise routine      Core Components/Risk Factors/Patient Goals Review:      Goals and Risk Factor Review    Row Name 01/15/16 1640 02/12/16 0841           Core Components/Risk Factors/Patient Goals Review   Personal Goals Review Weight Management/Obesity;Other  -      Review pt weight is fluctuating. Referred pt to Nutritionist to assist with dietary changes to meet weightloss goals. Pt is walking at the fitness center and only uses walking stick for long distances. Pt is able to walk independently in cardiac rehab for 15 minutes continuous       Expected Outcomes Pt will increase activity levels and follow nutrition plan/advice to assist with losing wt. Pt will continue to show improvement in cardiovascular fitness and improve endurance/stamina.         Core Components/Risk Factors/Patient Goals at Discharge (Final Review):      Goals and Risk Factor Review - 02/12/16 0841      Core Components/Risk Factors/Patient Goals Review   Review Pt is walking at the fitness center and only uses walking stick for long distances. Pt is able to walk independently in cardiac rehab for 15 minutes continuous    Expected Outcomes Pt will continue to show improvement in cardiovascular fitness and improve endurance/stamina.      ITP Comments:     ITP Comments    Row Name 12/13/15 [phone]  02/08/16 1435         ITP Comments Dr. Fransico Him, Medical Director attended Hypertension education  class          Comments: Pt is making expected progress toward personal goals after completing 25 sessions. Recommend continued exercise and life style modification education including  stress management and relaxation techniques to decrease cardiac risk profile.

## 2016-02-16 NOTE — Progress Notes (Signed)
02/18/2016 CAVANI DEBRUYN   [birth year]  PC:[id number]  Primary Physician Garret Reddish, MD Primary Cardiologist: Dr Martinique  HPI:  [name] is seen for follow up CAD. He has a history of HTN, remote tobacco abuse, psoriasis, prostate cancer s/p XRT and radiation seed therapy, adenomatous colon polyps and diverticulitis s/p previous abdominal surgeries. He presented to [facility] on 11/04/15 as a inferolateral STEMI. Emergent cath revealed a very large LCx with extensive thrombus. He underwent PCI with DES. With reperfusion he had NSVT. The pt also had residual RCA and LAD disease and an EF of 25% at cath. Echo done 11/05/15 showed an EF of 40-45%. His Troponin peaked at 25. On 11/06/15 he was taken back to the lab for staged PCI to his LAD and RCA. This was successful but on 11/07/15 the pt related that he developed some word finding issues that started post PCI 8/29. MRI revealed scattered small acute infarcts in the bilateral anterior and posterior circulation.  CA dopplers revealed mild plaque (1-39%). The pt's symptoms were improving and no further therapy was recommended.    On follow up today he denies any chest pain or SOB. His wife (a retired Therapist, sports) is with him. His speech issues continue to improve an he only notices this when he gets really tired. His BP has been running high in the am. Even at cardiac Rehab BP consistently >144. He is having some back pain. Seen by urology and scan planned in January. He has some minor nosebleeds and bruising.    Current Outpatient Prescriptions  Medication Sig Dispense Refill  . acetaminophen (TYLENOL) 325 MG tablet Take 2 tablets (650 mg total) by mouth every 4 (four) hours as needed for headache or mild pain. (Patient taking differently: Take 325 mg by mouth every 4 (four) hours as needed for headache or mild pain. )    . aspirin EC 81 MG tablet Take 1 tablet (81 mg total) by mouth daily. 90 tablet 1  . atorvastatin (LIPITOR) 80 MG tablet Take 1 tablet (80  mg total) by mouth daily at 6 PM. 90 tablet 3  . beta carotene w/minerals (OCUVITE) tablet Take 1 tablet by mouth daily.    . carvedilol (COREG) 6.25 MG tablet Take 1 tablet (6.25 mg total) by mouth 2 (two) times daily with a meal. 180 tablet 3  . Cyanocobalamin (VITAMIN B-12) 5000 MCG TBDP Take 5,000 mcg by mouth daily.    . nitroGLYCERIN (NITROSTAT) 0.4 MG SL tablet Place 1 tablet (0.4 mg total) under the tongue every 5 (five) minutes x 3 doses as needed for chest pain. 25 tablet 2  . pantoprazole (PROTONIX) 40 MG tablet Take 1 tablet (40 mg total) by mouth daily. (Patient taking differently: Take 40 mg by mouth daily as needed. ) 30 tablet 3  . spironolactone (ALDACTONE) 25 MG tablet Take 1 tablet (25 mg total) by mouth daily. 90 tablet 3  . ticagrelor (BRILINTA) 90 MG TABS tablet Take 1 tablet (90 mg total) by mouth 2 (two) times daily. 180 tablet 3  . losartan (COZAAR) 50 MG tablet Take 1 tablet (50 mg total) by mouth daily. 90 tablet 3   No current facility-administered medications for this visit.     Allergies  Allergen Reactions  . Bee Venom Swelling    Facial swelling  . Betadine [Povidone Iodine] Other (See Comments)    blistering    Social History   Social History  . Marital status: Married    Spouse  name: N/A  . Number of children: 3  . Years of education: N/A   Occupational History  . Not on file.   Social History Main Topics  . Smoking status: Former Smoker    Quit date: 03/11/1983  . Smokeless tobacco: Never Used  . Alcohol use 0.0 oz/week     Comment: RARE  . Drug use: No  . Sexual activity: Not on file   Other Topics Concern  . Not on file   Social History Narrative   [facility] [place], [place].   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  All other systems reviewed and are otherwise negative except as noted above.  Blood pressure 138/66, pulse (!) 56, height 6\' 1"  (1.854 m), weight 214 lb 12.8 oz (97.4 kg).  General appearance: alert, cooperative and no distress Neck: no carotid bruit and no JVD Lungs: clear to auscultation bilaterally Heart: regular rate and rhythm Extremities: extremities normal, atraumatic, no cyanosis or edema Skin: Skin color, texture, turgor normal. No rashes or lesions Neurologic: Grossly normal  EKG not done  Laboratory data:  Lab Results  Component Value Date   WBC 10.7 (H) 11/08/2015   HGB 12.1 (L) 11/08/2015   HCT 37.8 (L) 11/08/2015   PLT 233 11/08/2015   GLUCOSE 84 11/16/2015   CHOL 98 11/05/2015   TRIG 88 11/05/2015   HDL 29 (L) 11/05/2015   LDLDIRECT 68.1 12/01/2013   LDLCALC 51 11/05/2015   ALT 39 11/04/2015   AST 194 (H) 11/04/2015   NA 137 11/16/2015   K 4.8 11/16/2015   CL 102 11/16/2015   CREATININE 1.22 (H) 11/16/2015   BUN 22 11/16/2015   CO2 27 11/16/2015   TSH 2.48 12/15/2014   PSA 0.01 (L) 12/15/2014   INR 1.08 11/04/2015   HGBA1C 5.3 11/04/2015     ASSESSMENT AND PLAN:  1. CAD s/p inferolateral STEMI in August with thrombotic occlusion of the LCx. S/p DES. Subsequent staged PCI of the proximal LAD and RCA. On DAPT for one year. Asymptomatic. 2. Ischemic LV dysfunction. Asymptomatic. On Coreg. Losartan, aldactone.  3. 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 Resolved. 4. Hypertension. Will increase losartan to 50 mg daily 5. Hyperlipidemia. On high dose statin. Check lab work today.    PLAN  Check complete lab work today. Increase losartan. Follow up in 6 months.  Makhayla Mcmurry Martinique MD,FACC 02/18/2016 9:23 AM

## 2016-02-18 ENCOUNTER — Ambulatory Visit (INDEPENDENT_AMBULATORY_CARE_PROVIDER_SITE_OTHER): Payer: Medicare Other | Admitting: Cardiology

## 2016-02-18 ENCOUNTER — Encounter (HOSPITAL_COMMUNITY): Payer: Medicare Other

## 2016-02-18 ENCOUNTER — Encounter (HOSPITAL_COMMUNITY)
Admission: RE | Admit: 2016-02-18 | Discharge: 2016-02-18 | Disposition: A | Discharge status: Home / Self Care | Payer: Medicare Other | Source: Ambulatory Visit | Attending: Cardiology | Admitting: Cardiology

## 2016-02-18 ENCOUNTER — Encounter: Payer: Self-pay | Admitting: Cardiology

## 2016-02-18 VITALS — BP 138/66 | HR 56 | Ht 73.0 in | Wt 214.8 lb

## 2016-02-18 DIAGNOSIS — I2510 Atherosclerotic heart disease of native coronary artery without angina pectoris: Secondary | ICD-10-CM | POA: Diagnosis not present

## 2016-02-18 DIAGNOSIS — I63133 Cerebral infarction due to embolism of bilateral carotid arteries: Secondary | ICD-10-CM | POA: Diagnosis not present

## 2016-02-18 DIAGNOSIS — Z9861 Coronary angioplasty status: Secondary | ICD-10-CM

## 2016-02-18 DIAGNOSIS — Z955 Presence of coronary angioplasty implant and graft: Secondary | ICD-10-CM

## 2016-02-18 DIAGNOSIS — I255 Ischemic cardiomyopathy: Secondary | ICD-10-CM | POA: Diagnosis not present

## 2016-02-18 DIAGNOSIS — I213 ST elevation (STEMI) myocardial infarction of unspecified site: Secondary | ICD-10-CM | POA: Diagnosis not present

## 2016-02-18 DIAGNOSIS — I2121 ST elevation (STEMI) myocardial infarction involving left circumflex coronary artery: Secondary | ICD-10-CM

## 2016-02-18 DIAGNOSIS — I10 Essential (primary) hypertension: Secondary | ICD-10-CM

## 2016-02-18 MED ORDER — LOSARTAN POTASSIUM 50 MG PO TABS: 50.0000 mg | ORAL_TABLET | Freq: Every day | ORAL | 3 refills | Status: DC

## 2016-02-18 NOTE — Patient Instructions (Signed)
We will check blood work today  We will increase losartan to 50 mg daily  I will see you in 6 months.

## 2016-02-19 DIAGNOSIS — H353132 Nonexudative age-related macular degeneration, bilateral, intermediate dry stage: Secondary | ICD-10-CM | POA: Diagnosis not present

## 2016-02-19 DIAGNOSIS — Z961 Presence of intraocular lens: Secondary | ICD-10-CM | POA: Diagnosis not present

## 2016-02-19 DIAGNOSIS — H35032 Hypertensive retinopathy, left eye: Secondary | ICD-10-CM | POA: Diagnosis not present

## 2016-02-20 ENCOUNTER — Encounter (HOSPITAL_COMMUNITY)
Admission: RE | Admit: 2016-02-20 | Discharge: 2016-02-20 | Disposition: A | Discharge status: Home / Self Care | Payer: Medicare Other | Source: Ambulatory Visit | Attending: Cardiology | Admitting: Cardiology

## 2016-02-20 ENCOUNTER — Encounter (HOSPITAL_COMMUNITY): Payer: Medicare Other

## 2016-02-20 DIAGNOSIS — I213 ST elevation (STEMI) myocardial infarction of unspecified site: Secondary | ICD-10-CM | POA: Diagnosis not present

## 2016-02-20 DIAGNOSIS — I10 Essential (primary) hypertension: Secondary | ICD-10-CM | POA: Diagnosis not present

## 2016-02-20 DIAGNOSIS — I2121 ST elevation (STEMI) myocardial infarction involving left circumflex coronary artery: Secondary | ICD-10-CM

## 2016-02-20 DIAGNOSIS — Z955 Presence of coronary angioplasty implant and graft: Secondary | ICD-10-CM

## 2016-02-20 DIAGNOSIS — Z9861 Coronary angioplasty status: Secondary | ICD-10-CM | POA: Diagnosis not present

## 2016-02-20 DIAGNOSIS — I2510 Atherosclerotic heart disease of native coronary artery without angina pectoris: Secondary | ICD-10-CM | POA: Diagnosis not present

## 2016-02-20 DIAGNOSIS — I63133 Cerebral infarction due to embolism of bilateral carotid arteries: Secondary | ICD-10-CM | POA: Diagnosis not present

## 2016-02-20 DIAGNOSIS — I255 Ischemic cardiomyopathy: Secondary | ICD-10-CM | POA: Diagnosis not present

## 2016-02-20 LAB — LIPID PANEL
CHOL/HDL RATIO: 2.3 ratio (ref <5.0)
Cholesterol: 66 mg/dL (ref <200)
HDL: 29 mg/dL — AB (ref >40)
LDL Cholesterol: 28 mg/dL (ref <100)
Triglycerides: 47 mg/dL (ref <150)
VLDL: 9 mg/dL (ref <30)

## 2016-02-20 LAB — BASIC METABOLIC PANEL
BUN: 21 mg/dL (ref 7–25)
CHLORIDE: 104 mmol/L (ref 98–110)
CO2: 28 mmol/L (ref 20–31)
Calcium: 8.6 mg/dL (ref 8.6–10.3)
Creat: 1.03 mg/dL (ref 0.70–1.18)
GLUCOSE: 91 mg/dL (ref 65–99)
POTASSIUM: 4.4 mmol/L (ref 3.5–5.3)
SODIUM: 141 mmol/L (ref 135–146)

## 2016-02-20 LAB — CBC WITH DIFFERENTIAL/PLATELET
BASOS PCT: 0 %
Basophils Absolute: 0 cells/uL (ref 0–200)
EOS PCT: 3 %
Eosinophils Absolute: 276 cells/uL (ref 15–500)
HEMATOCRIT: 38.6 % (ref 38.5–50.0)
Hemoglobin: 12.8 g/dL — ABNORMAL LOW (ref 13.2–17.1)
LYMPHS ABS: 920 {cells}/uL (ref 850–3900)
LYMPHS PCT: 10 %
MCH: 32.5 pg (ref 27.0–33.0)
MCHC: 33.2 g/dL (ref 32.0–36.0)
MCV: 98 fL (ref 80.0–100.0)
MONO ABS: 920 {cells}/uL (ref 200–950)
MPV: 9.2 fL (ref 7.5–12.5)
Monocytes Relative: 10 %
NEUTROS PCT: 77 %
Neutro Abs: 7084 cells/uL (ref 1500–7800)
Platelets: 257 10*3/uL (ref 140–400)
RBC: 3.94 MIL/uL — AB (ref 4.20–5.80)
RDW: 14.6 % (ref 11.0–15.0)
WBC: 9.2 10*3/uL (ref 3.8–10.8)

## 2016-02-20 LAB — HEPATIC FUNCTION PANEL
ALBUMIN: 3.5 g/dL — AB (ref 3.6–5.1)
ALT: 19 U/L (ref 9–46)
AST: 17 U/L (ref 10–35)
Alkaline Phosphatase: 76 U/L (ref 40–115)
BILIRUBIN DIRECT: 0.3 mg/dL — AB (ref <=0.2)
BILIRUBIN TOTAL: 1.1 mg/dL (ref 0.2–1.2)
Indirect Bilirubin: 0.8 mg/dL (ref 0.2–1.2)
Total Protein: 6.2 g/dL (ref 6.1–8.1)

## 2016-02-21 ENCOUNTER — Telehealth: Payer: Self-pay | Admitting: Cardiology

## 2016-02-21 NOTE — Telephone Encounter (Signed)
Advised patient of lab results   Notes Recorded by Luanna Salk, LPN on [id number] at 12:03 PM EST Patient called no answer.[facility]  Notes Recorded by Peter M Martinique, MD on 02/21/2016 at 8:19 AM EST Chemistries and lipids are excellent. Mild anemia improved from prior.

## 2016-02-21 NOTE — Telephone Encounter (Signed)
New message  Pt is returning call [facility]'s call  Please call back

## 2016-02-22 ENCOUNTER — Encounter (HOSPITAL_COMMUNITY)
Admission: RE | Admit: 2016-02-22 | Discharge: 2016-02-22 | Disposition: A | Discharge status: Home / Self Care | Payer: Medicare Other | Source: Ambulatory Visit | Attending: Cardiology | Admitting: Cardiology

## 2016-02-22 ENCOUNTER — Encounter (HOSPITAL_COMMUNITY): Payer: Medicare Other

## 2016-02-22 DIAGNOSIS — I2121 ST elevation (STEMI) myocardial infarction involving left circumflex coronary artery: Secondary | ICD-10-CM

## 2016-02-22 DIAGNOSIS — Z955 Presence of coronary angioplasty implant and graft: Secondary | ICD-10-CM | POA: Diagnosis not present

## 2016-02-22 DIAGNOSIS — I213 ST elevation (STEMI) myocardial infarction of unspecified site: Secondary | ICD-10-CM | POA: Diagnosis not present

## 2016-02-25 ENCOUNTER — Encounter (HOSPITAL_COMMUNITY)
Admission: RE | Admit: 2016-02-25 | Discharge: 2016-02-25 | Disposition: A | Discharge status: Home / Self Care | Payer: Medicare Other | Source: Ambulatory Visit | Attending: Cardiology | Admitting: Cardiology

## 2016-02-25 ENCOUNTER — Encounter (HOSPITAL_COMMUNITY): Payer: Medicare Other

## 2016-02-25 DIAGNOSIS — Z955 Presence of coronary angioplasty implant and graft: Secondary | ICD-10-CM | POA: Diagnosis not present

## 2016-02-25 DIAGNOSIS — I2121 ST elevation (STEMI) myocardial infarction involving left circumflex coronary artery: Secondary | ICD-10-CM

## 2016-02-25 DIAGNOSIS — I213 ST elevation (STEMI) myocardial infarction of unspecified site: Secondary | ICD-10-CM | POA: Diagnosis not present

## 2016-02-27 ENCOUNTER — Encounter (HOSPITAL_COMMUNITY): Payer: Medicare Other

## 2016-02-27 ENCOUNTER — Encounter (HOSPITAL_COMMUNITY)
Admission: RE | Admit: 2016-02-27 | Discharge: 2016-02-27 | Disposition: A | Discharge status: Home / Self Care | Payer: Medicare Other | Source: Ambulatory Visit | Attending: Cardiology | Admitting: Cardiology

## 2016-02-27 DIAGNOSIS — I2121 ST elevation (STEMI) myocardial infarction involving left circumflex coronary artery: Secondary | ICD-10-CM

## 2016-02-27 DIAGNOSIS — I213 ST elevation (STEMI) myocardial infarction of unspecified site: Secondary | ICD-10-CM | POA: Diagnosis not present

## 2016-02-27 DIAGNOSIS — Z955 Presence of coronary angioplasty implant and graft: Secondary | ICD-10-CM | POA: Diagnosis not present

## 2016-02-28 ENCOUNTER — Encounter: Payer: Self-pay | Admitting: Cardiology

## 2016-02-29 ENCOUNTER — Encounter (HOSPITAL_COMMUNITY)
Admission: RE | Admit: 2016-02-29 | Discharge: 2016-02-29 | Disposition: A | Discharge status: Home / Self Care | Payer: Medicare Other | Source: Ambulatory Visit | Attending: Cardiology | Admitting: Cardiology

## 2016-02-29 ENCOUNTER — Encounter (HOSPITAL_COMMUNITY): Payer: Medicare Other

## 2016-02-29 DIAGNOSIS — Z955 Presence of coronary angioplasty implant and graft: Secondary | ICD-10-CM

## 2016-02-29 DIAGNOSIS — I2121 ST elevation (STEMI) myocardial infarction involving left circumflex coronary artery: Secondary | ICD-10-CM

## 2016-02-29 DIAGNOSIS — I213 ST elevation (STEMI) myocardial infarction of unspecified site: Secondary | ICD-10-CM | POA: Diagnosis not present

## 2016-03-05 ENCOUNTER — Encounter (HOSPITAL_COMMUNITY)
Admission: RE | Admit: 2016-03-05 | Discharge: 2016-03-05 | Disposition: A | Discharge status: Home / Self Care | Payer: Medicare Other | Source: Ambulatory Visit | Attending: Cardiology | Admitting: Cardiology

## 2016-03-05 ENCOUNTER — Encounter (HOSPITAL_COMMUNITY): Payer: Medicare Other

## 2016-03-05 DIAGNOSIS — Z955 Presence of coronary angioplasty implant and graft: Secondary | ICD-10-CM

## 2016-03-05 DIAGNOSIS — I213 ST elevation (STEMI) myocardial infarction of unspecified site: Secondary | ICD-10-CM | POA: Diagnosis not present

## 2016-03-05 DIAGNOSIS — I2121 ST elevation (STEMI) myocardial infarction involving left circumflex coronary artery: Secondary | ICD-10-CM

## 2016-03-07 ENCOUNTER — Encounter (HOSPITAL_COMMUNITY): Payer: Medicare Other

## 2016-03-07 ENCOUNTER — Encounter (HOSPITAL_COMMUNITY)
Admission: RE | Admit: 2016-03-07 | Discharge: 2016-03-07 | Disposition: A | Discharge status: Home / Self Care | Payer: Medicare Other | Source: Ambulatory Visit | Attending: Cardiology | Admitting: Cardiology

## 2016-03-07 DIAGNOSIS — I213 ST elevation (STEMI) myocardial infarction of unspecified site: Secondary | ICD-10-CM | POA: Diagnosis not present

## 2016-03-07 DIAGNOSIS — Z955 Presence of coronary angioplasty implant and graft: Secondary | ICD-10-CM | POA: Diagnosis not present

## 2016-03-07 DIAGNOSIS — I2121 ST elevation (STEMI) myocardial infarction involving left circumflex coronary artery: Secondary | ICD-10-CM

## 2016-03-12 ENCOUNTER — Encounter (HOSPITAL_COMMUNITY)
Admission: RE | Admit: 2016-03-12 | Discharge: 2016-03-12 | Disposition: A | Discharge status: Home / Self Care | Payer: Medicare Other | Source: Ambulatory Visit | Attending: Cardiology | Admitting: Cardiology

## 2016-03-12 ENCOUNTER — Encounter (HOSPITAL_COMMUNITY): Payer: Medicare Other

## 2016-03-12 DIAGNOSIS — Z955 Presence of coronary angioplasty implant and graft: Secondary | ICD-10-CM | POA: Diagnosis not present

## 2016-03-12 DIAGNOSIS — I213 ST elevation (STEMI) myocardial infarction of unspecified site: Secondary | ICD-10-CM | POA: Diagnosis not present

## 2016-03-12 DIAGNOSIS — I2121 ST elevation (STEMI) myocardial infarction involving left circumflex coronary artery: Secondary | ICD-10-CM

## 2016-03-12 NOTE — Progress Notes (Signed)
Cardiac Individual Treatment Plan  Patient Details  Name: Jose Ware MRN: [id number] Date of Birth: July 07, 1934 Referring Provider:   Flowsheet Row CARDIAC REHAB PHASE II ORIENTATION from 12/13/2015 in [place]  Referring Provider  Martinique, Peter MD      Initial Encounter Date:  [place] PHASE II ORIENTATION from 12/13/2015 in [facility]  Date  12/13/15  Referring Provider  Martinique, Peter MD      Visit Diagnosis: No diagnosis found.  Patient's Home Medications on Admission:  Current Outpatient Prescriptions:  .  acetaminophen (TYLENOL) 325 MG tablet, Take 2 tablets (650 mg total) by mouth every 4 (four) hours as needed for headache or mild pain. (Patient taking differently: Take 325 mg by mouth every 4 (four) hours as needed for headache or mild pain. ), Disp: , Rfl:  .  aspirin EC 81 MG tablet, Take 1 tablet (81 mg total) by mouth daily., Disp: 90 tablet, Rfl: 1 .  atorvastatin (LIPITOR) 80 MG tablet, Take 1 tablet (80 mg total) by mouth daily at 6 PM., Disp: 90 tablet, Rfl: 3 .  beta carotene w/minerals (OCUVITE) tablet, Take 1 tablet by mouth daily., Disp: , Rfl:  .  carvedilol (COREG) 6.25 MG tablet, Take 1 tablet (6.25 mg total) by mouth 2 (two) times daily with a meal., Disp: 180 tablet, Rfl: 3 .  Cyanocobalamin (VITAMIN B-12) 5000 MCG TBDP, Take 5,000 mcg by mouth daily., Disp: , Rfl:  .  losartan (COZAAR) 50 MG tablet, Take 1 tablet (50 mg total) by mouth daily., Disp: 90 tablet, Rfl: 3 .  nitroGLYCERIN (NITROSTAT) 0.4 MG SL tablet, Place 1 tablet (0.4 mg total) under the tongue every 5 (five) minutes x 3 doses as needed for chest pain., Disp: 25 tablet, Rfl: 2 .  pantoprazole (PROTONIX) 40 MG tablet, Take 1 tablet (40 mg total) by mouth daily. (Patient taking differently: Take 40 mg by mouth daily as needed. ), Disp: 30 tablet, Rfl: 3 .  spironolactone (ALDACTONE) 25 MG tablet, Take 1  tablet (25 mg total) by mouth daily., Disp: 90 tablet, Rfl: 3 .  ticagrelor (BRILINTA) 90 MG TABS tablet, Take 1 tablet (90 mg total) by mouth 2 (two) times daily., Disp: 180 tablet, Rfl: 3  Past Medical History: Past Medical History:  Diagnosis Date  . Adenocarcinoma of prostate [facility])    XRT & Radiation seed implantation in 2010  . Adenomatous colon polyp   . CAD (coronary artery disease)    a. 11/04/15:  Acute inferolateral STEMI: S/p emergent DES of a very large LCx with extensive thrombus. Significant residual disease in the proximal LAD and distal RCA. S/p staged PCI of LAD and RCA 8/29.  [place] CVA (cerebral infarction)    a. 123456: embolic CVA after heart cath   . CVA (cerebral infarction)    a. 123456: embolic CVA after heart cath   . DIVERTICULITIS, HX OF 11/27/2006       . DJD (degenerative joint disease)    wrist - R   . Hernia   . History of blood transfusion 1985   with colon surgery  . Hypertension   . Ischemic cardiomyopathy    a. EF 25-30% by LV gram on cath 11/04/15. Appeared out of proportion to infarct although infarct was large. EF improved by Echo and now 40-45%.  . NEPHROLITHIASIS, HX OF 11/27/2006        . Peripheral neuropathy ([place])   . Psoriasis   .  Tenosynovitis     Tobacco Use: History  Smoking Status  . Former Smoker  . Quit date: 03/11/1983  Smokeless Tobacco  . Never Used    Labs: Recent Review Flowsheet Data    Labs for ITP Cardiac and Pulmonary Rehab Latest Ref Rng & Units 12/01/2013 12/15/2014 11/04/2015 11/05/2015 02/20/2016   Cholestrol <200 mg/dL - 104 - 98 66   LDLCALC <100 mg/dL - 59 - 51 28   LDLDIRECT mg/dL 68.1 - - - -   HDL >40 mg/dL - 32.00(L) - 29(L) 29(L)   Trlycerides <150 mg/dL - 64.0 - 88 47   Hemoglobin A1c 4.8 - 5.6 % - - 5.3 - -      Capillary Blood Glucose: No results found for: GLUCAP   Exercise Target Goals:    Exercise Program Goal: Individual exercise prescription set with THRR, safety & activity barriers.  Participant demonstrates ability to understand and report RPE using BORG scale, to self-measure pulse accurately, and to acknowledge the importance of the exercise prescription.  Exercise Prescription Goal: Starting with aerobic activity 30 plus minutes a day, 3 days per week for initial exercise prescription. Provide home exercise prescription and guidelines that participant acknowledges understanding prior to discharge.  Activity Barriers & Risk Stratification:     Activity Barriers & Cardiac Risk Stratification - 12/13/15 0941      Activity Barriers & Cardiac Risk Stratification   Activity Barriers Balance Concerns;Muscular Weakness;Deconditioning  R wrist extension limitation/restrictions   Cardiac Risk Stratification High      6 Minute Walk:     6 Minute Walk    Row Name 12/13/15 1339         6 Minute Walk   Phase Initial     Distance 1177 feet     Walk Time 6 minutes     # of Rest Breaks 0     MPH 2.2     METS 1.73     RPE 9     VO2 Peak 6.08     Symptoms No     Resting HR 54 bpm     Resting BP 108/60     Max Ex. HR 72 bpm     Max Ex. BP 112/60     2 Minute Post BP 114/60        Initial Exercise Prescription:     Initial Exercise Prescription - 12/13/15 1300      Date of Initial Exercise RX and Referring Provider   Date 12/13/15   Referring Provider Martinique, Peter MD     Recumbant Bike   Level 1   Minutes 10   METs 1.3     NuStep   Level 2   Minutes 10   METs 2     Track   Laps 8   Minutes 10   METs 2.39     Prescription Details   Frequency (times per week) 3   Duration Progress to 30 minutes of continuous aerobic without signs/symptoms of physical distress     Intensity   THRR 40-80% of Max Heartrate 56-113   Ratings of Perceived Exertion 11-13   Perceived Dyspnea 0-4     Progression   Progression Continue to progress workloads to maintain intensity without signs/symptoms of physical distress.     Resistance Training   Training  Prescription Yes   Weight 1lb   Reps 10-12      Perform Capillary Blood Glucose checks as needed.  Exercise Prescription Changes:  Exercise Prescription Changes    Row Name 12/17/15 1600 01/15/16 1600 02/13/16 1000 03/12/16 0800       Exercise Review   Progression Yes Yes Yes Yes      Response to Exercise   Blood Pressure (Admit) 130/74 138/76 144/80 146/82    Blood Pressure (Exercise) 144/70 140/64 150/76 158/90    Blood Pressure (Exit) 140/70 128/70 132/84 140/72    Heart Rate (Admit) 61 bpm 62 bpm 70 bpm 59 bpm    Heart Rate (Exercise) 79 bpm 92 bpm 88 bpm 90 bpm    Heart Rate (Exit) 60 bpm 57 bpm 61 bpm 59 bpm    Rating of Perceived Exertion (Exercise) 11 12 11 12     Comments  -  - none none    Duration Progress to 30 minutes of continuous aerobic without signs/symptoms of physical distress Progress to 30 minutes of continuous aerobic without signs/symptoms of physical distress Progress to 30 minutes of continuous aerobic without signs/symptoms of physical distress Progress to 30 minutes of continuous aerobic without signs/symptoms of physical distress    Intensity THRR unchanged THRR unchanged THRR unchanged THRR unchanged      Progression   Progression Continue to progress workloads to maintain intensity without signs/symptoms of physical distress. Continue to progress workloads to maintain intensity without signs/symptoms of physical distress. Continue to progress workloads to maintain intensity without signs/symptoms of physical distress. Continue to progress workloads to maintain intensity without signs/symptoms of physical distress.    Average METs 2.5 3.5 2.9 3.5      Resistance Training   Training Prescription Yes Yes Yes Yes    Weight 2lbs 2lbs 4lbs 4lbs    Reps 10-12 10-12 10-12 10-12      Recumbant Bike   Level 2.5  - 3.5  -    Minutes 10  - 15  -    METs 2  - 2.9  -      NuStep   Level 2 4  - 5    Minutes 10 15  - 15    METs 3.3 4  - 4.1      Track    Laps 8 18 17 16     Minutes 10 15 15 15     METs 2.39 4.14 2.97 2.86      Home Exercise Plan   Plans to continue exercise at  - Home  Reviewed on 12/24/15 Home  Reviewed on 12/24/15 Home  Reviewed on 12/24/15    Frequency  - Add 2 additional days to program exercise sessions. Add 2 additional days to program exercise sessions. Add 2 additional days to program exercise sessions.       Exercise Comments:     Exercise Comments    Row Name 12/17/15 1638 01/15/16 1639 02/13/16 1015 03/12/16 0826     Exercise Comments Pt is tolerating exercise very well; there are no changes to current Ex. Rx, will continue to monitor pt's progression and activity levels. Reviewed METs and goals. Pt is tolerating exercise wel; will continue to monitor pt's progression and activity levels. Reviewed METs and goals. Pt is tolerating exercise wel; will continue to monitor pt's progression and activity levels. Reviewed METs and goals. Pt is tolerating exercise wel; will continue to monitor pt's progression and activity levels.       Discharge Exercise Prescription (Final Exercise Prescription Changes):     Exercise Prescription Changes - 03/12/16 0800      Exercise Review   Progression Yes  Response to Exercise   Blood Pressure (Admit) 146/82   Blood Pressure (Exercise) 158/90   Blood Pressure (Exit) 140/72   Heart Rate (Admit) 59 bpm   Heart Rate (Exercise) 90 bpm   Heart Rate (Exit) 59 bpm   Rating of Perceived Exertion (Exercise) 12   Comments none   Duration Progress to 30 minutes of continuous aerobic without signs/symptoms of physical distress   Intensity THRR unchanged     Progression   Progression Continue to progress workloads to maintain intensity without signs/symptoms of physical distress.   Average METs 3.5     Resistance Training   Training Prescription Yes   Weight 4lbs   Reps 10-12     NuStep   Level 5   Minutes 15   METs 4.1     Track   Laps 16   Minutes 15   METs  2.86     Home Exercise Plan   Plans to continue exercise at Home  Reviewed on 12/24/15   Frequency Add 2 additional days to program exercise sessions.      Nutrition:  Target Goals: Understanding of nutrition guidelines, daily intake of sodium 1500mg , cholesterol 200mg , calories 30% from fat and 7% or less from saturated fats, daily to have 5 or more servings of fruits and vegetables.  Biometrics:     Pre Biometrics - 12/13/15 1336      Pre Biometrics   Waist Circumference 43 inches   Hip Circumference 42.5 inches   Waist to Hip Ratio 1.01 %   Triceps Skinfold 20 mm   % Body Fat 31 %   Grip Strength 33 kg   Flexibility 12 in   Single Leg Stand 0.87 seconds       Nutrition Therapy Plan and Nutrition Goals:     Nutrition Therapy & Goals - 12/13/15 1049      Nutrition Therapy   Diet Therapeutic Lifestyle Changes     Intervention Plan   Intervention Prescribe, educate and counsel regarding individualized specific dietary modifications aiming towards targeted core components such as weight, hypertension, lipid management, diabetes, heart failure and other comorbidities.   Expected Outcomes Short Term Goal: Understand basic principles of dietary content, such as calories, fat, sodium, cholesterol and nutrients.;Long Term Goal: Adherence to prescribed nutrition plan.      Nutrition Discharge: Nutrition Scores:     Nutrition Assessments - 12/13/15 1049      MEDFICTS Scores   Pre Score 60      Nutrition Goals Re-Evaluation:   Psychosocial: Target Goals: Acknowledge presence or absence of depression, maximize coping skills, provide positive support system. Participant is able to verbalize types and ability to use techniques and skills needed for reducing stress and depression.  Initial Review & Psychosocial Screening:     Initial Psych Review & Screening - 12/19/15 [place]? Yes     Barriers   Psychosocial barriers to  participate in program There are no identifiable barriers or psychosocial needs.     Screening Interventions   Interventions Encouraged to exercise      Quality of Life Scores:     Quality of Life - 03/06/16 1616      Quality of Life Scores   Health/Function Pre 27.5 %   Health/Function Post 23.83 %   Health/Function % Change -13.35 %   Socioeconomic Pre 26.25 %   Socioeconomic Post 25.31 %   Socioeconomic % Change  -3.58 %  Psych/Spiritual Pre 27.86 %   Psych/Spiritual Post 24.64 %   Psych/Spiritual % Change -11.56 %   Family Pre 29 %   Family Post 25 %   Family % Change -13.79 %   GLOBAL Pre 27.5 %   GLOBAL Post 24.5 %   GLOBAL % Change -10.91 %      PHQ-9: Recent Review Flowsheet Data    Depression screen [facility] 2/9 02/27/2014   Decreased Interest 0   Down, Depressed, Hopeless 0   PHQ - 2 Score 0      Psychosocial Evaluation and Intervention:     Psychosocial Evaluation - 12/19/15 1647      Psychosocial Evaluation & Interventions   Interventions Stress management education;Relaxation education;Encouraged to exercise with the program and follow exercise prescription   Comments no psychosocial needs identified, no inteventions necessary    Continued Psychosocial Services Needed No      Psychosocial Re-Evaluation:     Psychosocial Re-Evaluation    Row Name 01/15/16 0953 02/12/16 1029 03/12/16 0748         Psychosocial Re-Evaluation   Interventions Encouraged to attend Cardiac Rehabilitation for the exercise Encouraged to attend Cardiac Rehabilitation for the exercise Encouraged to attend Cardiac Rehabilitation for the exercise     Comments no psychosocial needs identified, no interventions necessary.  no psychosocial needs identified, no interventions necessary.  pt is walking daily and is learning his limitations.  no psychosocial needs identified, no interventions necessary.  pt is walking daily.      Continued Psychosocial Services Needed No No No         Vocational Rehabilitation: Provide vocational rehab assistance to qualifying candidates.   Vocational Rehab Evaluation & Intervention:     Vocational Rehab - 12/14/15 1306      Initial Vocational Rehab Evaluation & Intervention   Assessment shows need for Vocational Rehabilitation No  pt does not plan to return to competive employment. Pt is retired      Education: Education Goals: Education classes will be provided on a weekly basis, covering required topics. Participant will state understanding/return demonstration of topics presented.  Learning Barriers/Preferences:     Learning Barriers/Preferences - 12/13/15 [id number]      Learning Barriers/Preferences   Learning Barriers Sight  readers   Learning Preferences Written Material;Video;Verbal Instruction;Skilled Demonstration;Pictoral      Education Topics: Count Your Pulse:  -Group instruction provided by verbal instruction, demonstration, patient participation and written materials to support subject.  Instructors address importance of being able to find your pulse and how to count your pulse when at home without a heart monitor.  Patients get hands on experience counting their pulse with staff help and individually. Flowsheet Row CARDIAC REHAB PHASE II EXERCISE from 02/06/2016 in [place]  Date  01/11/16  Educator  Barnet Pall, RN  Instruction Review Code  2- meets goals/outcomes      Heart Attack, Angina, and Risk Factor Modification:  -Group instruction provided by verbal instruction, video, and written materials to support subject.  Instructors address signs and symptoms of angina and heart attacks.    Also discuss risk factors for heart disease and how to make changes to improve heart health risk factors. Flowsheet Row CARDIAC REHAB PHASE II EXERCISE from 02/06/2016 in [place]  Date  12/26/15  Instruction Review Code  2- meets goals/outcomes       Functional Fitness:  -Group instruction provided by verbal instruction, demonstration, patient participation, and written  materials to support subject.  Instructors address safety measures for doing things around the house.  Discuss how to get up and down off the floor, how to pick things up properly, how to safely get out of a chair without assistance, and balance training. Flowsheet Row CARDIAC REHAB PHASE II EXERCISE from 02/06/2016 in [place]  Date  01/18/16  Instruction Review Code  2- meets goals/outcomes      Meditation and Mindfulness:  -Group instruction provided by verbal instruction, patient participation, and written materials to support subject.  Instructor addresses importance of mindfulness and meditation practice to help reduce stress and improve awareness.  Instructor also leads participants through a meditation exercise.  Flowsheet Row CARDIAC REHAB PHASE II EXERCISE from 02/06/2016 in [place]  Date  01/02/16  Educator  [place]  Instruction Review Code  2- meets goals/outcomes      Stretching for Flexibility and Mobility:  -Group instruction provided by verbal instruction, patient participation, and written materials to support subject.  Instructors lead participants through series of stretches that are designed to increase flexibility thus improving mobility.  These stretches are additional exercise for major muscle groups that are typically performed during regular warm up and cool down. Flowsheet Row CARDIAC REHAB PHASE II EXERCISE from 02/06/2016 in [place]  Date  01/04/16  Instruction Review Code  2- meets goals/outcomes      Hands Only CPR Anytime:  -Group instruction provided by verbal instruction, video, patient participation and written materials to support subject.  Instructors co-teach with AHA video for hands only CPR.  Participants get hands on  experience with mannequins.   Nutrition I class: Heart Healthy Eating:  -Group instruction provided by PowerPoint slides, verbal discussion, and written materials to support subject matter. The instructor gives an explanation and review of the Therapeutic Lifestyle Changes diet recommendations, which includes a discussion on lipid goals, dietary fat, sodium, fiber, plant stanol/sterol esters, sugar, and the components of a well-balanced, healthy diet. Flowsheet Row CARDIAC REHAB PHASE II EXERCISE from 02/06/2016 in [place]  Date  01/15/16  Educator  RD  Instruction Review Code  2- meets goals/outcomes      Nutrition II class: Lifestyle Skills:  -Group instruction provided by PowerPoint slides, verbal discussion, and written materials to support subject matter. The instructor gives an explanation and review of label reading, grocery shopping for heart health, heart healthy recipe modifications, and ways to make healthier choices when eating out. Flowsheet Row CARDIAC REHAB PHASE II EXERCISE from 02/06/2016 in [place]  Date  12/18/15  Educator  RD  Instruction Review Code  2- meets goals/outcomes      Diabetes Question & Answer:  -Group instruction provided by PowerPoint slides, verbal discussion, and written materials to support subject matter. The instructor gives an explanation and review of diabetes co-morbidities, pre- and post-prandial blood glucose goals, pre-exercise blood glucose goals, signs, symptoms, and treatment of hypoglycemia and hyperglycemia, and foot care basics.   Diabetes Blitz:  -Group instruction provided by PowerPoint slides, verbal discussion, and written materials to support subject matter. The instructor gives an explanation and review of the physiology behind type 1 and type 2 diabetes, diabetes medications and rational behind using different medications, pre- and post-prandial blood glucose  recommendations and Hemoglobin A1c goals, diabetes diet, and exercise including blood glucose guidelines for exercising safely.    Portion Distortion:  -Group  instruction provided by PowerPoint slides, verbal discussion, written materials, and food models to support subject matter. The instructor gives an explanation of serving size versus portion size, changes in portions sizes over the last 20 years, and what consists of a serving from each food group. Flowsheet Row CARDIAC REHAB PHASE II EXERCISE from 02/06/2016 in [place]  Date  02/06/16 [Holiday Eating Survival Tips]  Educator  RD  Instruction Review Code  2- meets goals/outcomes      Stress Management:  -Group instruction provided by verbal instruction, video, and written materials to support subject matter.  Instructors review role of stress in heart disease and how to cope with stress positively.   Flowsheet Row CARDIAC REHAB PHASE II EXERCISE from 02/06/2016 in [place]  Date  01/23/16  Instruction Review Code  2- meets goals/outcomes      Exercising on Your Own:  -Group instruction provided by verbal instruction, power point, and written materials to support subject.  Instructors discuss benefits of exercise, components of exercise, frequency and intensity of exercise, and end points for exercise.  Also discuss use of nitroglycerin and activating EMS.  Review options of places to exercise outside of rehab.  Review guidelines for sex with heart disease. Flowsheet Row CARDIAC REHAB PHASE II EXERCISE from 02/06/2016 in [place]  Date  01/25/16  Instruction Review Code  2- meets goals/outcomes      Cardiac Drugs I:  -Group instruction provided by verbal instruction and written materials to support subject.  Instructor reviews cardiac drug classes: antiplatelets, anticoagulants, beta blockers, and statins.  Instructor discusses  reasons, side effects, and lifestyle considerations for each drug class. Flowsheet Row CARDIAC REHAB PHASE II EXERCISE from 02/06/2016 in [place]  Date  12/19/15  Instruction Review Code  2- meets goals/outcomes      Cardiac Drugs II:  -Group instruction provided by verbal instruction and written materials to support subject.  Instructor reviews cardiac drug classes: angiotensin converting enzyme inhibitors (ACE-I), angiotensin II receptor blockers (ARBs), nitrates, and calcium channel blockers.  Instructor discusses reasons, side effects, and lifestyle considerations for each drug class. Flowsheet Row CARDIAC REHAB PHASE II EXERCISE from 02/06/2016 in [place]  Date  01/16/16  Educator  pharmacist  Instruction Review Code  2- meets goals/outcomes      Anatomy and Physiology of the Circulatory System:  -Group instruction provided by verbal instruction, video, and written materials to support subject.  Reviews functional anatomy of heart, how it relates to various diagnoses, and what role the heart plays in the overall system.   Knowledge Questionnaire Score:     Knowledge Questionnaire Score - 03/06/16 1616      Knowledge Questionnaire Score   Post Score 24/24      Core Components/Risk Factors/Patient Goals at Admission:     Personal Goals and Risk Factors at Admission - 12/13/15 0942      Core Components/Risk Factors/Patient Goals on Admission    Weight Management Yes;Weight Loss;Weight Maintenance   Intervention Weight Management: Develop a combined nutrition and exercise program designed to reach desired caloric intake, while maintaining appropriate intake of nutrient and fiber, sodium and fats, and appropriate energy expenditure required for the weight goal.;Weight Management: Provide education and appropriate resources to help participant work on and attain dietary goals.;Weight Management/Obesity:  Establish reasonable short term and long term weight goals.;Obesity: Provide education and appropriate resources to  help participant work on and attain dietary goals.   Expected Outcomes Short Term: Continue to assess and modify interventions until short term weight is achieved;Weight Maintenance: Understanding of the daily nutrition guidelines, which includes 25-35% calories from fat, 7% or less cal from saturated fats, less than 200mg  cholesterol, less than 1.5gm of sodium, & 5 or more servings of fruits and vegetables daily;Long Term: Adherence to nutrition and physical activity/exercise program aimed toward attainment of established weight goal;Weight Loss: Understanding of general recommendations for a balanced deficit meal plan, which promotes 1-2 lb weight loss per week and includes a negative energy balance of [phone] kcal/d;Understanding recommendations for meals to include 15-35% energy as protein, 25-35% energy from fat, 35-60% energy from carbohydrates, less than 200mg  of dietary cholesterol, 20-35 gm of total fiber daily   Sedentary Yes   Intervention Provide advice, education, support and counseling about physical activity/exercise needs.;Develop an individualized exercise prescription for aerobic and resistive training based on initial evaluation findings, risk stratification, comorbidities and participant's personal goals.   Expected Outcomes Achievement of increased cardiorespiratory fitness and enhanced flexibility, muscular endurance and strength shown through measurements of functional capacity and personal statement of participant.   Increase Strength and Stamina Yes   Intervention Provide advice, education, support and counseling about physical activity/exercise needs.;Develop an individualized exercise prescription for aerobic and resistive training based on initial evaluation findings, risk stratification, comorbidities and participant's personal goals.   Expected Outcomes Achievement  of increased cardiorespiratory fitness and enhanced flexibility, muscular endurance and strength shown through measurements of functional capacity and personal statement of participant.   Improve shortness of breath with ADL's Yes   Intervention Provide education, individualized exercise plan and daily activity instruction to help decrease symptoms of SOB with activities of daily living.   Expected Outcomes Short Term: Achieves a reduction of symptoms when performing activities of daily living.   Hypertension Yes   Intervention Provide education on lifestyle modifcations including regular physical activity/exercise, weight management, moderate sodium restriction and increased consumption of fresh fruit, vegetables, and low fat dairy, alcohol moderation, and smoking cessation.;Monitor prescription use compliance.   Expected Outcomes Short Term: Continued assessment and intervention until BP is < 140/68mm HG in hypertensive participants. < 130/4mm HG in hypertensive participants with diabetes, heart failure or chronic kidney disease.;Long Term: Maintenance of blood pressure at goal levels.   Personal Goal Other Yes   Personal Goal short: return to yardwork and improve energy levels   long: lose and maintain weighloss and be able to see grand-daughter graduate from [place] exercise prescription to improve cardiovascular fitness and nutiriton counseling to aid in weighloss   Expected Outcomes Pt will lose and maintain weightloss. Pt will also improve cardiovascular fitness and establish an exercise routine      Core Components/Risk Factors/Patient Goals Review:      Goals and Risk Factor Review    Row Name 01/15/16 1640 02/12/16 0841 03/06/16 1716         Core Components/Risk Factors/Patient Goals Review   Personal Goals Review Weight Management/Obesity;Other  - Other     Review pt weight is fluctuating. Referred pt to Nutritionist to assist with dietary changes to meet  weightloss goals. Pt is walking at the fitness center and only uses walking stick for long distances. Pt is able to walk independently in cardiac rehab for 15 minutes continuous  Pt stated that balance was still a challenge. Pt is taking a balance class at [facility]. Pt plans to continue  exercise and  balance classes there after completing [facility]II.     Expected Outcomes Pt will increase activity levels and follow nutrition plan/advice to assist with losing wt. Pt will continue to show improvement in cardiovascular fitness and improve endurance/stamina. Pt will continue exercise program at [facility] and continue with risk factor modifications.        Core Components/Risk Factors/Patient Goals at Discharge (Final Review):      Goals and Risk Factor Review - 03/06/16 1716      Core Components/Risk Factors/Patient Goals Review   Personal Goals Review Other   Review Pt stated that balance was still a challenge. Pt is taking a balance class at [facility]. Pt plans to continue exercise and  balance classes there after completing CRPII.   Expected Outcomes Pt will continue exercise program at [facility] and continue with risk factor modifications.      ITP Comments:     ITP Comments    Row Name 12/13/15 [phone] 02/08/16 1435         ITP Comments Dr. Fransico Him, Medical Director attended Hypertension education class          Comments: Pt is making expected progress toward personal goals after completing 34 sessions. Recommend continued exercise and life style modification education including  stress management and relaxation techniques to decrease cardiac risk profile.

## 2016-03-14 ENCOUNTER — Encounter (HOSPITAL_COMMUNITY)
Admission: RE | Admit: 2016-03-14 | Discharge: 2016-03-14 | Disposition: A | Discharge status: Home / Self Care | Payer: Medicare Other | Source: Ambulatory Visit | Attending: Cardiology | Admitting: Cardiology

## 2016-03-14 ENCOUNTER — Encounter (HOSPITAL_COMMUNITY): Payer: Self-pay

## 2016-03-14 ENCOUNTER — Encounter (HOSPITAL_COMMUNITY): Payer: Medicare Other

## 2016-03-14 VITALS — Ht 71.25 in | Wt 214.7 lb

## 2016-03-14 DIAGNOSIS — I213 ST elevation (STEMI) myocardial infarction of unspecified site: Secondary | ICD-10-CM | POA: Diagnosis not present

## 2016-03-14 DIAGNOSIS — I2121 ST elevation (STEMI) myocardial infarction involving left circumflex coronary artery: Secondary | ICD-10-CM

## 2016-03-14 DIAGNOSIS — Z955 Presence of coronary angioplasty implant and graft: Secondary | ICD-10-CM | POA: Diagnosis not present

## 2016-03-14 NOTE — Progress Notes (Signed)
Cardiac Individual Treatment Plan  Patient Details  Name: Jose Ware MRN: [id number] Date of Birth: 1934/08/18 Referring Provider:   Flowsheet Row CARDIAC REHAB PHASE II ORIENTATION from 12/13/2015 in [facility] CARDIAC REHAB  Referring Provider  [place], Peter MD      Initial Encounter Date:  Flowsheet Row CARDIAC REHAB PHASE II ORIENTATION from 12/13/2015 in [facility] CARDIAC REHAB  Date  12/13/15  Referring Provider  [place], Peter MD      Visit Diagnosis: 11/04/15 ST elevation myocardial infarction involving left circumflex coronary artery (HCC)  11/06/15 Status post coronary artery stent placement  Patient's Home Medications on Admission:  Current Outpatient Prescriptions:  .  acetaminophen (TYLENOL) 325 MG tablet, Take 2 tablets (650 mg total) by mouth every 4 (four) hours as needed for headache or mild pain. (Patient taking differently: Take 325 mg by mouth every 4 (four) hours as needed for headache or mild pain. ), Disp: , Rfl:  .  aspirin EC 81 MG tablet, Take 1 tablet (81 mg total) by mouth daily., Disp: 90 tablet, Rfl: 1 .  atorvastatin (LIPITOR) 80 MG tablet, Take 1 tablet (80 mg total) by mouth daily at 6 PM., Disp: 90 tablet, Rfl: 3 .  beta carotene w/minerals (OCUVITE) tablet, Take 1 tablet by mouth daily., Disp: , Rfl:  .  carvedilol (COREG) 6.25 MG tablet, Take 1 tablet (6.25 mg total) by mouth 2 (two) times daily with a meal., Disp: 180 tablet, Rfl: 3 .  Cyanocobalamin (VITAMIN B-12) 5000 MCG TBDP, Take 5,000 mcg by mouth daily., Disp: , Rfl:  .  losartan (COZAAR) 50 MG tablet, Take 1 tablet (50 mg total) by mouth daily., Disp: 90 tablet, Rfl: 3 .  nitroGLYCERIN (NITROSTAT) 0.4 MG SL tablet, Place 1 tablet (0.4 mg total) under the tongue every 5 (five) minutes x 3 doses as needed for chest pain., Disp: 25 tablet, Rfl: 2 .  pantoprazole (PROTONIX) 40 MG tablet, Take 1 tablet (40 mg total) by mouth daily. (Patient taking  differently: Take 40 mg by mouth daily as needed. ), Disp: 30 tablet, Rfl: 3 .  spironolactone (ALDACTONE) 25 MG tablet, Take 1 tablet (25 mg total) by mouth daily., Disp: 90 tablet, Rfl: 3 .  ticagrelor (BRILINTA) 90 MG TABS tablet, Take 1 tablet (90 mg total) by mouth 2 (two) times daily., Disp: 180 tablet, Rfl: 3  Past Medical History: Past Medical History:  Diagnosis Date  . Adenocarcinoma of prostate [facility])    XRT & Radiation seed implantation in 2010  . Adenomatous colon polyp   . CAD (coronary artery disease)    a. 11/04/15:  Acute inferolateral STEMI: S/p emergent DES of a very large LCx with extensive thrombus. Significant residual disease in the proximal LAD and distal RCA. S/p staged PCI of LAD and RCA 8/29.  Marland Kitchen CVA (cerebral infarction)    a. 10/2015: embolic CVA after heart cath   . CVA (cerebral infarction)    a. 10/2015: embolic CVA after heart cath   . DIVERTICULITIS, HX OF 11/27/2006       . DJD (degenerative joint disease)    wrist - R   . Hernia   . History of blood transfusion 1985   with colon surgery  . Hypertension   . Ischemic cardiomyopathy    a. EF 25-30% by LV gram on cath 11/04/15. Appeared out of proportion to infarct although infarct was large. EF improved by Echo and now 40-45%.  . NEPHROLITHIASIS, HX OF  11/27/2006        . Peripheral neuropathy (HCC)   . Psoriasis   . Tenosynovitis     Tobacco Use: History  Smoking Status  . Former Smoker  . Quit date: 03/11/1983  Smokeless Tobacco  . Never Used    Labs: Recent Review Flowsheet Data    Labs for ITP Cardiac and Pulmonary Rehab Latest Ref Rng & Units 12/01/2013 12/15/2014 11/04/2015 11/05/2015 02/20/2016   Cholestrol <200 mg/dL - 401 - 98 66   LDLCALC <100 mg/dL - 59 - 51 28   LDLDIRECT mg/dL 89.9 - - - -   HDL >21 mg/dL - 23.56(W) - 68(B) 39(N)   Trlycerides <150 mg/dL - 92.0 - 88 47   Hemoglobin A1c 4.8 - 5.6 % - - 5.3 - -      Capillary Blood Glucose: No results found for: GLUCAP   Exercise  Target Goals:    Exercise Program Goal: Individual exercise prescription set with THRR, safety & activity barriers. Participant demonstrates ability to understand and report RPE using BORG scale, to self-measure pulse accurately, and to acknowledge the importance of the exercise prescription.  Exercise Prescription Goal: Starting with aerobic activity 30 plus minutes a day, 3 days per week for initial exercise prescription. Provide home exercise prescription and guidelines that participant acknowledges understanding prior to discharge.  Activity Barriers & Risk Stratification:     Activity Barriers & Cardiac Risk Stratification - 12/13/15 0941      Activity Barriers & Cardiac Risk Stratification   Activity Barriers Balance Concerns;Muscular Weakness;Deconditioning  R wrist extension limitation/restrictions   Cardiac Risk Stratification High      6 Minute Walk:     6 Minute Walk    Row Name 12/13/15 1339 03/18/16 1058       6 Minute Walk   Phase Initial Discharge    Distance 1177 feet 1681 feet    Distance % Change  - 42.82 %    Walk Time 6 minutes 6 minutes    # of Rest Breaks 0 0    MPH 2.2 3.18    METS 1.73 3.17    RPE 9 13    VO2 Peak 6.08 11.12    Symptoms No No    Resting HR 54 bpm 67 bpm    Resting BP 108/60 158/64    Max Ex. HR 72 bpm 97 bpm    Max Ex. BP 112/60 162/78    2 Minute Post BP 114/60 140/68       Initial Exercise Prescription:     Initial Exercise Prescription - 12/13/15 1300      Date of Initial Exercise RX and Referring Provider   Date 12/13/15   Referring Provider [place], Peter MD     Recumbant Bike   Level 1   Minutes 10   METs 1.3     NuStep   Level 2   Minutes 10   METs 2     Track   Laps 8   Minutes 10   METs 2.39     Prescription Details   Frequency (times per week) 3   Duration Progress to 30 minutes of continuous aerobic without signs/symptoms of physical distress     Intensity   THRR 40-80% of Max Heartrate  56-113   Ratings of Perceived Exertion 11-13   Perceived Dyspnea 0-4     Progression   Progression Continue to progress workloads to maintain intensity without signs/symptoms of physical distress.     Resistance Training  Training Prescription Yes   Weight 1lb   Reps 10-12      Perform Capillary Blood Glucose checks as needed.  Exercise Prescription Changes:      Exercise Prescription Changes    Row Name 12/17/15 1600 01/15/16 1600 02/13/16 1000 03/12/16 0800 03/14/16 1500     Exercise Review   Progression Yes Yes Yes Yes Yes     Response to Exercise   Blood Pressure (Admit) 130/74 138/76 144/80 146/82 158/74   Blood Pressure (Exercise) 144/70 140/64 150/76 158/90 152/74   Blood Pressure (Exit) 140/70 128/70 132/84 140/72 140/74   Heart Rate (Admit) 61 bpm 62 bpm 70 bpm 59 bpm 65 bpm   Heart Rate (Exercise) 79 bpm 92 bpm 88 bpm 90 bpm 94 bpm   Heart Rate (Exit) 60 bpm 57 bpm 61 bpm 59 bpm 61 bpm   Rating of Perceived Exertion (Exercise) '11 12 11 12 12   '$ Comments  -  - none none none   Duration Progress to 30 minutes of continuous aerobic without signs/symptoms of physical distress Progress to 30 minutes of continuous aerobic without signs/symptoms of physical distress Progress to 30 minutes of continuous aerobic without signs/symptoms of physical distress Progress to 30 minutes of continuous aerobic without signs/symptoms of physical distress Progress to 30 minutes of continuous aerobic without signs/symptoms of physical distress   Intensity THRR unchanged THRR unchanged THRR unchanged THRR unchanged THRR unchanged     Progression   Progression Continue to progress workloads to maintain intensity without signs/symptoms of physical distress. Continue to progress workloads to maintain intensity without signs/symptoms of physical distress. Continue to progress workloads to maintain intensity without signs/symptoms of physical distress. Continue to progress workloads to maintain  intensity without signs/symptoms of physical distress. Continue to progress workloads to maintain intensity without signs/symptoms of physical distress.   Average METs 2.5 3.5 2.9 3.5 3.9     Resistance Training   Training Prescription Yes Yes Yes Yes Yes   Weight 2lbs 2lbs 4lbs 4lbs 4lbs   Reps 10-12 10-12 10-12 10-12 10-12     Recumbant Bike   Level 2.5  - 3.5  -  -   Minutes 10  - 15  -  -   METs 2  - 2.9  -  -     NuStep   Level 2 4  - 5 5   Minutes 10 15  - 15 15   METs 3.3 4  - 4.1 4.3     Track   Laps '8 18 17 16 18   '$ Minutes '10 15 15 15 15   '$ METs 2.39 4.14 2.97 2.86 3.09     Home Exercise Plan   Plans to continue exercise at  - Home  Reviewed on 12/24/15 Home  Reviewed on 12/24/15 Home  Reviewed on 12/24/15 Home  Reviewed on 12/24/15   Frequency  - Add 2 additional days to program exercise sessions. Add 2 additional days to program exercise sessions. Add 2 additional days to program exercise sessions. Add 2 additional days to program exercise sessions.      Exercise Comments:      Exercise Comments    Row Name 12/17/15 1638 01/15/16 1639 02/13/16 1015 03/12/16 0826 03/14/16 1551   Exercise Comments Pt is tolerating exercise very well; there are no changes to current Ex. Rx, will continue to monitor pt's progression and activity levels. Reviewed METs and goals. Pt is tolerating exercise wel; will continue to monitor pt's progression and activity levels.  Reviewed METs and goals. Pt is tolerating exercise wel; will continue to monitor pt's progression and activity levels. Reviewed METs and goals. Pt is tolerating exercise wel; will continue to monitor pt's progression and activity levels. Pt completed 36 sessions of cardiac rehab. Pt plans to continue exercise at [facility] or [facility] 3x/week and walk daily for 30 minutes. Discussed  temperature/emergency precautions pt verbalize understanding.      Discharge Exercise Prescription (Final Exercise Prescription Changes):      Exercise Prescription Changes - 03/14/16 1500      Exercise Review   Progression Yes     Response to Exercise   Blood Pressure (Admit) 158/74   Blood Pressure (Exercise) 152/74   Blood Pressure (Exit) 140/74   Heart Rate (Admit) 65 bpm   Heart Rate (Exercise) 94 bpm   Heart Rate (Exit) 61 bpm   Rating of Perceived Exertion (Exercise) 12   Comments none   Duration Progress to 30 minutes of continuous aerobic without signs/symptoms of physical distress   Intensity THRR unchanged     Progression   Progression Continue to progress workloads to maintain intensity without signs/symptoms of physical distress.   Average METs 3.9     Resistance Training   Training Prescription Yes   Weight 4lbs   Reps 10-12     NuStep   Level 5   Minutes 15   METs 4.3     Track   Laps 18   Minutes 15   METs 3.09     Home Exercise Plan   Plans to continue exercise at Home  Reviewed on 12/24/15   Frequency Add 2 additional days to program exercise sessions.      Nutrition:  Target Goals: Understanding of nutrition guidelines, daily intake of sodium '1500mg'$ , cholesterol '200mg'$ , calories 30% from fat and 7% or less from saturated fats, daily to have 5 or more servings of fruits and vegetables.  Biometrics:     Pre Biometrics - 12/13/15 1336      Pre Biometrics   Waist Circumference 43 inches   Hip Circumference 42.5 inches   Waist to Hip Ratio 1.01 %   Triceps Skinfold 20 mm   % Body Fat 31 %   Grip Strength 33 kg   Flexibility 12 in   Single Leg Stand 0.87 seconds         Post Biometrics - 03/18/16 1056       Post  Biometrics   Height 5' 11.25" (1.81 m)   Weight 214 lb 11.7 oz (97.4 kg)   Waist Circumference 43.25 inches   Hip Circumference 41.25 inches   Waist to Hip Ratio 1.05 %   BMI (Calculated) 29.8   Triceps Skinfold 18 mm   % Body Fat 30.7 %   Grip Strength 35 kg   Flexibility 13 in   Single Leg Stand 0 seconds      Nutrition Therapy Plan and Nutrition  Goals:     Nutrition Therapy & Goals - 12/13/15 1049      Nutrition Therapy   Diet Therapeutic Lifestyle Changes     Intervention Plan   Intervention Prescribe, educate and counsel regarding individualized specific dietary modifications aiming towards targeted core components such as weight, hypertension, lipid management, diabetes, heart failure and other comorbidities.   Expected Outcomes Short Term Goal: Understand basic principles of dietary content, such as calories, fat, sodium, cholesterol and nutrients.;Long Term Goal: Adherence to prescribed nutrition plan.      Nutrition Discharge: Nutrition Scores:  Nutrition Assessments - 03/24/16 1030      MEDFICTS Scores   Pre Score 60   Post Score 36   Score Difference -24      Nutrition Goals Re-Evaluation:   Psychosocial: Target Goals: Acknowledge presence or absence of depression, maximize coping skills, provide positive support system. Participant is able to verbalize types and ability to use techniques and skills needed for reducing stress and depression.  Initial Review & Psychosocial Screening:     Initial Psych Review & Screening - 12/19/15 Walker? Yes     Barriers   Psychosocial barriers to participate in program There are no identifiable barriers or psychosocial needs.     Screening Interventions   Interventions Encouraged to exercise      Quality of Life Scores:     Quality of Life - 03/06/16 1616      Quality of Life Scores   Health/Function Pre 27.5 %   Health/Function Post 23.83 %   Health/Function % Change -13.35 %   Socioeconomic Pre 26.25 %   Socioeconomic Post 25.31 %   Socioeconomic % Change  -3.58 %   Psych/Spiritual Pre 27.86 %   Psych/Spiritual Post 24.64 %   Psych/Spiritual % Change -11.56 %   Family Pre 29 %   Family Post 25 %   Family % Change -13.79 %   GLOBAL Pre 27.5 %   GLOBAL Post 24.5 %   GLOBAL % Change -10.91 %       PHQ-9: Recent Review Flowsheet Data    Depression screen [facility] 2/9 03/14/2016 02/27/2014   Decreased Interest 0 0   Down, Depressed, Hopeless 0 0   PHQ - 2 Score 0 0      Psychosocial Evaluation and Intervention:     Psychosocial Evaluation - 03/14/16 1533      Discharge Psychosocial Assessment & Intervention   Discharge Continue support measures as needed   Comments no psychosocial needs identified, no intervention necessary       Psychosocial Re-Evaluation:     Psychosocial Re-Evaluation    Row Name 01/15/16 0953 02/12/16 1029 03/12/16 0748         Psychosocial Re-Evaluation   Interventions Encouraged to attend Cardiac Rehabilitation for the exercise Encouraged to attend Cardiac Rehabilitation for the exercise Encouraged to attend Cardiac Rehabilitation for the exercise     Comments no psychosocial needs identified, no interventions necessary.  no psychosocial needs identified, no interventions necessary.  pt is walking daily and is learning his limitations.  no psychosocial needs identified, no interventions necessary.  pt is walking daily.      Continued Psychosocial Services Needed No No No        Vocational Rehabilitation: Provide vocational rehab assistance to qualifying candidates.   Vocational Rehab Evaluation & Intervention:     Vocational Rehab - 12/14/15 1306      Initial Vocational Rehab Evaluation & Intervention   Assessment shows need for Vocational Rehabilitation No  pt does not plan to return to competive employment. Pt is retired      Education: Education Goals: Education classes will be provided on a weekly basis, covering required topics. Participant will state understanding/return demonstration of topics presented.  Learning Barriers/Preferences:     Learning Barriers/Preferences - 12/13/15 3244      Learning Barriers/Preferences   Learning Barriers Sight  readers   Learning Preferences Written Material;Video;Verbal  Instruction;Skilled Demonstration;Pictoral      Education Topics: Count  Your Pulse:  -Group instruction provided by verbal instruction, demonstration, patient participation and written materials to support subject.  Instructors address importance of being able to find your pulse and how to count your pulse when at home without a heart monitor.  Patients get hands on experience counting their pulse with staff help and individually. Flowsheet Row CARDIAC REHAB PHASE II EXERCISE from 02/06/2016 in [place]  Date  01/11/16  Educator  Barnet Pall, RN  Instruction Review Code  2- meets goals/outcomes      Heart Attack, Angina, and Risk Factor Modification:  -Group instruction provided by verbal instruction, video, and written materials to support subject.  Instructors address signs and symptoms of angina and heart attacks.    Also discuss risk factors for heart disease and how to make changes to improve heart health risk factors. Flowsheet Row CARDIAC REHAB PHASE II EXERCISE from 02/06/2016 in [place]  Date  12/26/15  Instruction Review Code  2- meets goals/outcomes      Functional Fitness:  -Group instruction provided by verbal instruction, demonstration, patient participation, and written materials to support subject.  Instructors address safety measures for doing things around the house.  Discuss how to get up and down off the floor, how to pick things up properly, how to safely get out of a chair without assistance, and balance training. Flowsheet Row CARDIAC REHAB PHASE II EXERCISE from 02/06/2016 in [place]  Date  01/18/16  Instruction Review Code  2- meets goals/outcomes      Meditation and Mindfulness:  -Group instruction provided by verbal instruction, patient participation, and written materials to support subject.  Instructor addresses importance of mindfulness and meditation  practice to help reduce stress and improve awareness.  Instructor also leads participants through a meditation exercise.  Flowsheet Row CARDIAC REHAB PHASE II EXERCISE from 02/06/2016 in [place]  Date  01/02/16  Educator  [place]  Instruction Review Code  2- meets goals/outcomes      Stretching for Flexibility and Mobility:  -Group instruction provided by verbal instruction, patient participation, and written materials to support subject.  Instructors lead participants through series of stretches that are designed to increase flexibility thus improving mobility.  These stretches are additional exercise for major muscle groups that are typically performed during regular warm up and cool down. Flowsheet Row CARDIAC REHAB PHASE II EXERCISE from 02/06/2016 in [place]  Date  01/04/16  Instruction Review Code  2- meets goals/outcomes      Hands Only CPR Anytime:  -Group instruction provided by verbal instruction, video, patient participation and written materials to support subject.  Instructors co-teach with [facility] video for hands only CPR.  Participants get hands on experience with mannequins.   Nutrition I class: Heart Healthy Eating:  -Group instruction provided by PowerPoint slides, verbal discussion, and written materials to support subject matter. The instructor gives an explanation and review of the Therapeutic Lifestyle Changes diet recommendations, which includes a discussion on lipid goals, dietary fat, sodium, fiber, plant stanol/sterol esters, sugar, and the components of a well-balanced, healthy diet. Flowsheet Row CARDIAC REHAB PHASE II EXERCISE from 02/06/2016 in [place]  Date  01/15/16  Educator  RD  Instruction Review Code  2- meets goals/outcomes      Nutrition II class: Lifestyle Skills:  -Group instruction provided by PowerPoint slides, verbal discussion, and written  materials to support subject matter. The instructor gives an explanation and review of label reading, grocery shopping for heart health, heart healthy recipe modifications, and ways to make healthier choices when eating out. Flowsheet Row CARDIAC REHAB PHASE II EXERCISE from 02/06/2016 in [place]  Date  12/18/15  Educator  RD  Instruction Review Code  2- meets goals/outcomes      Diabetes Question & Answer:  -Group instruction provided by PowerPoint slides, verbal discussion, and written materials to support subject matter. The instructor gives an explanation and review of diabetes co-morbidities, pre- and post-prandial blood glucose goals, pre-exercise blood glucose goals, signs, symptoms, and treatment of hypoglycemia and hyperglycemia, and foot care basics.   Diabetes Blitz:  -Group instruction provided by PowerPoint slides, verbal discussion, and written materials to support subject matter. The instructor gives an explanation and review of the physiology behind type 1 and type 2 diabetes, diabetes medications and rational behind using different medications, pre- and post-prandial blood glucose recommendations and Hemoglobin A1c goals, diabetes diet, and exercise including blood glucose guidelines for exercising safely.    Portion Distortion:  -Group instruction provided by PowerPoint slides, verbal discussion, written materials, and food models to support subject matter. The instructor gives an explanation of serving size versus portion size, changes in portions sizes over the last 20 years, and what consists of a serving from each food group. Flowsheet Row CARDIAC REHAB PHASE II EXERCISE from 02/06/2016 in [place]  Date  02/06/16 [Holiday Eating Survival Tips]  Educator  RD  Instruction Review Code  2- meets goals/outcomes      Stress Management:  -Group instruction provided by verbal instruction, video, and written  materials to support subject matter.  Instructors review role of stress in heart disease and how to cope with stress positively.   Flowsheet Row CARDIAC REHAB PHASE II EXERCISE from 02/06/2016 in [place]  Date  01/23/16  Instruction Review Code  2- meets goals/outcomes      Exercising on Your Own:  -Group instruction provided by verbal instruction, power point, and written materials to support subject.  Instructors discuss benefits of exercise, components of exercise, frequency and intensity of exercise, and end points for exercise.  Also discuss use of nitroglycerin and activating EMS.  Review options of places to exercise outside of rehab.  Review guidelines for sex with heart disease. Flowsheet Row CARDIAC REHAB PHASE II EXERCISE from 02/06/2016 in [place]  Date  01/25/16  Instruction Review Code  2- meets goals/outcomes      Cardiac Drugs I:  -Group instruction provided by verbal instruction and written materials to support subject.  Instructor reviews cardiac drug classes: antiplatelets, anticoagulants, beta blockers, and statins.  Instructor discusses reasons, side effects, and lifestyle considerations for each drug class. Flowsheet Row CARDIAC REHAB PHASE II EXERCISE from 02/06/2016 in [place]  Date  12/19/15  Instruction Review Code  2- meets goals/outcomes      Cardiac Drugs II:  -Group instruction provided by verbal instruction and written materials to support subject.  Instructor reviews cardiac drug classes: angiotensin converting enzyme inhibitors (ACE-I), angiotensin II receptor blockers (ARBs), nitrates, and calcium channel blockers.  Instructor discusses reasons, side effects, and lifestyle considerations for each drug class. Flowsheet Row CARDIAC REHAB PHASE II EXERCISE from 02/06/2016 in [place]  Date  01/16/16  Educator  pharmacist   Instruction  Review Code  2- meets goals/outcomes      Anatomy and Physiology of the Circulatory System:  -Group instruction provided by verbal instruction, video, and written materials to support subject.  Reviews functional anatomy of heart, how it relates to various diagnoses, and what role the heart plays in the overall system.   Knowledge Questionnaire Score:     Knowledge Questionnaire Score - 03/06/16 1616      Knowledge Questionnaire Score   Post Score 24/24      Core Components/Risk Factors/Patient Goals at Admission:     Personal Goals and Risk Factors at Admission - 12/13/15 0942      Core Components/Risk Factors/Patient Goals on Admission    Weight Management Yes;Weight Loss;Weight Maintenance   Intervention Weight Management: Develop a combined nutrition and exercise program designed to reach desired caloric intake, while maintaining appropriate intake of nutrient and fiber, sodium and fats, and appropriate energy expenditure required for the weight goal.;Weight Management: Provide education and appropriate resources to help participant work on and attain dietary goals.;Weight Management/Obesity: Establish reasonable short term and long term weight goals.;Obesity: Provide education and appropriate resources to help participant work on and attain dietary goals.   Expected Outcomes Short Term: Continue to assess and modify interventions until short term weight is achieved;Weight Maintenance: Understanding of the daily nutrition guidelines, which includes 25-35% calories from fat, 7% or less cal from saturated fats, less than '200mg'$  cholesterol, less than 1.5gm of sodium, & 5 or more servings of fruits and vegetables daily;Long Term: Adherence to nutrition and physical activity/exercise program aimed toward attainment of established weight goal;Weight Loss: Understanding of general recommendations for a balanced deficit meal plan, which promotes 1-2 lb weight loss per week and  includes a negative energy balance of [phone] kcal/d;Understanding recommendations for meals to include 15-35% energy as protein, 25-35% energy from fat, 35-60% energy from carbohydrates, less than '200mg'$  of dietary cholesterol, 20-35 gm of total fiber daily   Sedentary Yes   Intervention Provide advice, education, support and counseling about physical activity/exercise needs.;Develop an individualized exercise prescription for aerobic and resistive training based on initial evaluation findings, risk stratification, comorbidities and participant's personal goals.   Expected Outcomes Achievement of increased cardiorespiratory fitness and enhanced flexibility, muscular endurance and strength shown through measurements of functional capacity and personal statement of participant.   Increase Strength and Stamina Yes   Intervention Provide advice, education, support and counseling about physical activity/exercise needs.;Develop an individualized exercise prescription for aerobic and resistive training based on initial evaluation findings, risk stratification, comorbidities and participant's personal goals.   Expected Outcomes Achievement of increased cardiorespiratory fitness and enhanced flexibility, muscular endurance and strength shown through measurements of functional capacity and personal statement of participant.   Improve shortness of breath with ADL's Yes   Intervention Provide education, individualized exercise plan and daily activity instruction to help decrease symptoms of SOB with activities of daily living.   Expected Outcomes Short Term: Achieves a reduction of symptoms when performing activities of daily living.   Hypertension Yes   Intervention Provide education on lifestyle modifcations including regular physical activity/exercise, weight management, moderate sodium restriction and increased consumption of fresh fruit, vegetables, and low fat dairy, alcohol moderation, and smoking  cessation.;Monitor prescription use compliance.   Expected Outcomes Short Term: Continued assessment and intervention until BP is < 140/48m HG in hypertensive participants. < 130/836mHG in hypertensive participants with diabetes, heart failure or chronic kidney disease.;Long Term: Maintenance of blood pressure at goal levels.   Personal  Goal Other Yes   Personal Goal short: return to yardwork and improve energy levels   long: lose and maintain weighloss and be able to see grand-daughter graduate from [facility] exercise prescription to improve cardiovascular fitness and nutiriton counseling to aid in weighloss   Expected Outcomes Pt will lose and maintain weightloss. Pt will also improve cardiovascular fitness and establish an exercise routine      Core Components/Risk Factors/Patient Goals Review:      Goals and Risk Factor Review    Row Name 01/15/16 1640 02/12/16 0841 03/06/16 1716         Core Components/Risk Factors/Patient Goals Review   Personal Goals Review Weight Management/Obesity;Other  - Other     Review pt weight is fluctuating. Referred pt to Nutritionist to assist with dietary changes to meet weightloss goals. Pt is walking at the fitness center and only uses walking stick for long distances. Pt is able to walk independently in cardiac rehab for 15 minutes continuous  Pt stated that balance was still a challenge. Pt is taking a [facility] class at [facility]. Pt plans to continue exercise and  balance classes there after completing CRPII.     Expected Outcomes Pt will increase activity levels and follow nutrition plan/advice to assist with losing wt. Pt will continue to show improvement in cardiovascular fitness and improve endurance/stamina. Pt will continue exercise program at [facility] and continue with risk factor modifications.        Core Components/Risk Factors/Patient Goals at Discharge (Final Review):      Goals and Risk Factor Review -  03/06/16 1716      Core Components/Risk Factors/Patient Goals Review   Personal Goals Review Other   Review Pt stated that balance was still a challenge. Pt is taking a balance class at [facility]. Pt plans to continue exercise and  balance classes there after completing CRPII.   Expected Outcomes Pt will continue exercise program at [facility] and continue with risk factor modifications.      ITP Comments:     ITP Comments    Row Name 12/13/15 [phone] 02/08/16 1435         ITP Comments Dr. Fransico Him, Medical Director attended Hypertension education class          Comments: Pt graduated from cardiac rehab program today with completion of 36 exercise sessions in Phase II. Pt maintained good attendance and progressed nicely during his participation in rehab as evidenced by increased MET level.   Medication list reconciled. Repeat  PHQ score-0  .  Pt has made significant lifestyle changes and should be commended for his success. Pt feels he has achieved his goals during cardiac rehab, which include increased strength and stamina.    Pt plans to continue exercising on his own at [facility] and [facility].

## 2016-03-17 ENCOUNTER — Encounter (HOSPITAL_COMMUNITY): Payer: Medicare Other

## 2016-03-19 ENCOUNTER — Encounter (HOSPITAL_COMMUNITY): Payer: Medicare Other

## 2016-03-21 ENCOUNTER — Encounter (HOSPITAL_COMMUNITY): Payer: Medicare Other

## 2016-03-24 ENCOUNTER — Encounter (HOSPITAL_COMMUNITY): Payer: Medicare Other

## 2016-03-24 NOTE — Addendum Note (Signed)
Encounter addended by: Jewel Baize, RD on: 03/24/2016 10:33 AM<BR>    Actions taken: Flowsheet data copied forward, Visit Navigator Flowsheet section accepted

## 2016-03-26 ENCOUNTER — Encounter (HOSPITAL_COMMUNITY): Payer: Medicare Other

## 2016-03-28 ENCOUNTER — Encounter (HOSPITAL_COMMUNITY): Payer: Medicare Other

## 2016-03-31 ENCOUNTER — Encounter (HOSPITAL_COMMUNITY): Payer: Medicare Other

## 2016-03-31 DIAGNOSIS — R3121 Asymptomatic microscopic hematuria: Secondary | ICD-10-CM | POA: Diagnosis not present

## 2016-04-02 ENCOUNTER — Encounter (HOSPITAL_COMMUNITY): Payer: Medicare Other

## 2016-04-04 ENCOUNTER — Encounter (HOSPITAL_COMMUNITY): Payer: Medicare Other

## 2016-04-07 ENCOUNTER — Encounter (HOSPITAL_COMMUNITY): Payer: Medicare Other

## 2016-04-09 ENCOUNTER — Encounter (HOSPITAL_COMMUNITY): Payer: Medicare Other

## 2016-04-11 ENCOUNTER — Encounter (HOSPITAL_COMMUNITY): Payer: Medicare Other

## 2016-04-14 ENCOUNTER — Encounter (HOSPITAL_COMMUNITY): Payer: Medicare Other

## 2016-04-16 ENCOUNTER — Encounter (HOSPITAL_COMMUNITY): Payer: Medicare Other

## 2016-04-18 ENCOUNTER — Encounter (HOSPITAL_COMMUNITY): Payer: Medicare Other

## 2016-04-21 ENCOUNTER — Encounter (HOSPITAL_COMMUNITY): Payer: Medicare Other

## 2016-04-23 ENCOUNTER — Encounter (HOSPITAL_COMMUNITY): Payer: Medicare Other

## 2016-05-14 ENCOUNTER — Encounter: Payer: Self-pay | Admitting: Family Medicine

## 2016-06-11 ENCOUNTER — Encounter: Payer: Self-pay | Admitting: Cardiology

## 2016-06-11 ENCOUNTER — Telehealth: Payer: Self-pay

## 2016-06-11 MED ORDER — CLOPIDOGREL BISULFATE 75 MG PO TABS: 75.0000 mg | ORAL_TABLET | Freq: Every day | ORAL | 6 refills | Status: DC

## 2016-06-11 NOTE — Telephone Encounter (Signed)
Spoke to patient we received your email about bruising and bleeding.Dr.Jordan advised to stop Brilinta and start Plavix 75 mg daily.Advised to continue aspirin 81 mg daily.Advised to call back if needed.

## 2016-06-24 ENCOUNTER — Encounter: Payer: Self-pay | Admitting: Gastroenterology

## 2016-08-12 NOTE — Progress Notes (Signed)
08/14/2016 Jose Ware   10-Aug-1934  [id number]  Primary Physician Jose Channel Brayton Mars, MD Primary Cardiologist: Dr Jose Ware  HPI:  Jose Ware is seen for follow up CAD. He has a history of HTN, remote tobacco abuse, psoriasis, prostate cancer s/p XRT and radiation seed therapy, adenomatous colon polyps and diverticulitis s/p previous abdominal surgeries. He presented to [facility] on 11/04/15 as a inferolateral STEMI. Emergent cath revealed a very large LCx with extensive thrombus. He underwent PCI with DES. With reperfusion he had NSVT. The pt also had residual RCA and LAD disease and an EF of 25% at cath. Echo done 11/05/15 showed an EF of 40-45%. His Troponin peaked at 25. On 11/06/15 he was taken back to the lab for staged PCI to his LAD and RCA. This was successful but on 11/07/15 the pt related that he developed some word finding issues that started post PCI 8/29. MRI revealed scattered small acute infarcts in the bilateral anterior and posterior circulation.  CA dopplers revealed mild plaque (1-39%). The pt's symptoms were improving and no further therapy was recommended.    On follow up today he reports he is doing very well. Denies any chest pain or SOB. Notes energy flags later in the day but he is very active in am walking and doing yard work.  His wife (a retired Therapist, sports) notes BP is consistently high at >341 systolic. His speech issues continue to improve. Notes HR at home never <50.    Current Outpatient Prescriptions  Medication Sig Dispense Refill  . acetaminophen (TYLENOL) 325 MG tablet Take 650 mg by mouth as needed.    Marland Kitchen aspirin EC 81 MG tablet Take 1 tablet (81 mg total) by mouth daily. 90 tablet 1  . atorvastatin (LIPITOR) 80 MG tablet Take 1 tablet (80 mg total) by mouth daily at 6 PM. 90 tablet 3  . beta carotene w/minerals (OCUVITE) tablet Take 1 tablet by mouth daily.    . carvedilol (COREG) 6.25 MG tablet Take 1 tablet (6.25 mg total) by mouth 2 (two) times daily with a meal.  180 tablet 3  . clopidogrel (PLAVIX) 75 MG tablet Take 1 tablet (75 mg total) by mouth daily. 30 tablet 6  . Cyanocobalamin (VITAMIN B-12) 5000 MCG TBDP Take 5,000 mcg by mouth daily.    . nitroGLYCERIN (NITROSTAT) 0.4 MG SL tablet Place 1 tablet (0.4 mg total) under the tongue every 5 (five) minutes x 3 doses as needed for chest pain. 25 tablet 2  . pantoprazole (PROTONIX) 40 MG tablet Take 40 mg by mouth as needed.    Marland Kitchen spironolactone (ALDACTONE) 25 MG tablet Take 1 tablet (25 mg total) by mouth daily. 90 tablet 3  . losartan (COZAAR) 100 MG tablet Take 1 tablet (100 mg total) by mouth daily. 90 tablet 3   No current facility-administered medications for this visit.     Allergies  Allergen Reactions  . Bee Venom Swelling    Facial swelling  . Betadine [Povidone Iodine] Other (See Comments)    blistering    Social History   Social History  . Marital status: Married    Spouse name: N/A  . Number of children: 3  . Years of education: N/A   Occupational History  . Not on file.   Social History Main Topics  . Smoking status: Former Smoker    Quit date: 03/11/1983  . Smokeless tobacco: Never Used  . Alcohol use 0.0 oz/week     Comment: RARE  .  Drug use: No  . Sexual activity: Not on file   Other Topics Concern  . Not on file   Social History Narrative   [facility] [place], [place].   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  All other systems reviewed and are otherwise negative except as noted above.  Blood pressure (!) 158/70, pulse (!) 47, height 5\' 11"  (1.803 m), weight 218 lb (98.9 kg).  General appearance: alert,  cooperative and no distress Neck: no carotid bruit and no JVD Lungs: clear to auscultation bilaterally Heart: regular rate and rhythm, normal S1-2. No gallop or murmur.  Extremities: extremities normal, atraumatic, no cyanosis or edema Skin: Skin color, texture, turgor normal. No rashes or lesions Neurologic: Grossly normal  EKG sinus brady rate 47. Otherwise normal. I have personally reviewed and interpreted this study.   Laboratory data:  Lab Results  Component Value Date   WBC 9.2 02/20/2016   HGB 12.8 (L) 02/20/2016   HCT 38.6 02/20/2016   PLT 257 02/20/2016   GLUCOSE 91 02/20/2016   CHOL 66 02/20/2016   TRIG 47 02/20/2016   HDL 29 (L) 02/20/2016   LDLDIRECT 68.1 12/01/2013   LDLCALC 28 02/20/2016   ALT 19 02/20/2016   AST 17 02/20/2016   NA 141 02/20/2016   K 4.4 02/20/2016   CL 104 02/20/2016   CREATININE 1.03 02/20/2016   BUN 21 02/20/2016   CO2 28 02/20/2016   TSH 2.48 12/15/2014   PSA 0.01 (L) 12/15/2014   INR 1.08 11/04/2015   HGBA1C 5.3 11/04/2015     ASSESSMENT AND PLAN:  1. CAD s/p inferolateral STEMI in August 2017 with thrombotic occlusion of the LCx. S/p DES. Subsequent staged PCI of the proximal LAD and RCA. On DAPT for one year. Asymptomatic now. 2. Ischemic LV dysfunction. Asymptomatic. On Coreg. Losartan, aldactone. No overt CHF. 3. 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 Resolved. 4. Hypertension. Will increase losartan to 100 mg daily. If BP remains high may need to add amlodipine. Cannot increase Coreg further due to bradycardia.  5. Hyperlipidemia. On high dose statin. Excellent control.     PLAN   Follow up in 6 months.  Jose Ware Martinique MD,FACC 08/14/2016 10:25 AM

## 2016-08-14 ENCOUNTER — Ambulatory Visit (INDEPENDENT_AMBULATORY_CARE_PROVIDER_SITE_OTHER): Payer: Medicare Other | Admitting: Cardiology

## 2016-08-14 ENCOUNTER — Encounter: Payer: Self-pay | Admitting: Cardiology

## 2016-08-14 VITALS — BP 158/70 | HR 47 | Ht 71.0 in | Wt 218.0 lb

## 2016-08-14 DIAGNOSIS — Z9861 Coronary angioplasty status: Secondary | ICD-10-CM

## 2016-08-14 DIAGNOSIS — I10 Essential (primary) hypertension: Secondary | ICD-10-CM

## 2016-08-14 DIAGNOSIS — E785 Hyperlipidemia, unspecified: Secondary | ICD-10-CM

## 2016-08-14 DIAGNOSIS — I255 Ischemic cardiomyopathy: Secondary | ICD-10-CM | POA: Diagnosis not present

## 2016-08-14 DIAGNOSIS — I2510 Atherosclerotic heart disease of native coronary artery without angina pectoris: Secondary | ICD-10-CM | POA: Diagnosis not present

## 2016-08-14 MED ORDER — LOSARTAN POTASSIUM 100 MG PO TABS: 100.0000 mg | ORAL_TABLET | Freq: Every day | ORAL | 3 refills | Status: DC

## 2016-08-14 NOTE — Patient Instructions (Addendum)
Increase losartan to 100 mg daily  Continue your other therapy  I will see you in 6 months with fasting lab

## 2016-08-26 ENCOUNTER — Encounter: Payer: Self-pay | Admitting: Family Medicine

## 2016-08-26 ENCOUNTER — Ambulatory Visit (INDEPENDENT_AMBULATORY_CARE_PROVIDER_SITE_OTHER): Payer: Medicare Other | Admitting: Family Medicine

## 2016-08-26 VITALS — BP 144/66 | HR 55 | Temp 97.6°F | Ht 71.0 in | Wt 219.2 lb

## 2016-08-26 DIAGNOSIS — R079 Chest pain, unspecified: Secondary | ICD-10-CM

## 2016-08-26 DIAGNOSIS — I10 Essential (primary) hypertension: Secondary | ICD-10-CM | POA: Diagnosis not present

## 2016-08-26 NOTE — Progress Notes (Signed)
Subjective:  Jose Ware is a 82 y.o. year old very pleasant male patient who presents for/with See problem oriented charting ROS- no fever, chills. No shortness of breath. Does have pain over right breast when palpated.    Past Medical History-  Patient Active Problem List   Diagnosis Date Noted  . CAD S/P PCI     Priority: High  . Primary hyperparathyroidism ([place]) 12/28/2013    Priority: High  . Fatigue 12/01/2013    Priority: High  . History of prostate cancer 12/14/2008    Priority: High  . Dyslipidemia 11/16/2015    Priority: Medium  . PSORIASIS 10/15/2007    Priority: Medium  . Essential hypertension 11/27/2006    Priority: Medium  . History of stomach cancer 12/01/2013    Priority: Low  . Lower back pain 03/24/2013    Priority: Low  . Bradycardia 09/23/2012    Priority: Low  . Bruising 09/23/2012    Priority: Low  . B12 deficiency 09/17/2010    Priority: Low  . COLONIC POLYPS, HX OF 12/14/2008    Priority: Low  . PERIPHERAL NEUROPATHY 10/18/2007    Priority: Low  . DEGENERATIVE JOINT DISEASE 11/27/2006    Priority: Low  . NEPHROLITHIASIS, HX OF 11/27/2006    Priority: Low  . NSVT (nonsustained ventricular tachycardia) ([place]) 11/08/2015  . Ischemic cardiomyopathy   . Embolic stroke ([place])   . Acute ST elevation myocardial infarction (STEMI) involving left circumflex coronary artery ([place]) 11/04/2015  . History of adenomatous polyp of colon 01/18/2015    Medications- reviewed and updated Current Outpatient Prescriptions  Medication Sig Dispense Refill  . acetaminophen (TYLENOL) 325 MG tablet Take 650 mg by mouth as needed.    Marland Kitchen aspirin EC 81 MG tablet Take 1 tablet (81 mg total) by mouth daily. 90 tablet 1  . atorvastatin (LIPITOR) 80 MG tablet Take 1 tablet (80 mg total) by mouth daily at 6 PM. 90 tablet 3  . beta carotene w/minerals (OCUVITE) tablet Take 1 tablet by mouth daily.    . carvedilol (COREG) 6.25 MG tablet Take 1 tablet (6.25 mg total) by  mouth 2 (two) times daily with a meal. 180 tablet 3  . clopidogrel (PLAVIX) 75 MG tablet Take 1 tablet (75 mg total) by mouth daily. 30 tablet 6  . Cyanocobalamin (VITAMIN B-12) 5000 MCG TBDP Take 5,000 mcg by mouth daily.    Marland Kitchen losartan (COZAAR) 100 MG tablet Take 1 tablet (100 mg total) by mouth daily. 90 tablet 3  . nitroGLYCERIN (NITROSTAT) 0.4 MG SL tablet Place 1 tablet (0.4 mg total) under the tongue every 5 (five) minutes x 3 doses as needed for chest pain. 25 tablet 2  . pantoprazole (PROTONIX) 40 MG tablet Take 40 mg by mouth as needed.    Marland Kitchen spironolactone (ALDACTONE) 25 MG tablet Take 1 tablet (25 mg total) by mouth daily. 90 tablet 3   No current facility-administered medications for this visit.     Objective: BP (!) 144/66 (BP Location: Left Arm, Patient Position: Sitting, Cuff Size: Large)   Pulse (!) 55   Temp 97.6 F (36.4 C) (Oral)   Ht 5\' 11"  (1.803 m)   Wt 219 lb 3.2 oz (99.4 kg)   SpO2 95%   BMI 30.57 kg/m  Gen: NAD, resting comfortably CV: RRR no murmurs rubs or gallops Patient with raised area under skin that is firm about 3 x 4 mm with more extensive bruising beyond this. He is most tender in the  firm area Lungs: CTAB no crackles, wheeze, rhonchi Ext: no edema Skin: warm, dry, no rash other than bruising mentioned above    Assessment/Plan:  Right-sided chest pain S: noted pain in right lateral breast on Monday. At first it was not bruised then noted a significant bruise grow. This morning noted hardened area under the skin. Pain up to 8-9/10 with palpation. Today about 4-5 with palpation. It is getting better. No treatments yet tried other than avoiding contact with it.   Patient with very easy bruising/bleeding on plavix. Had been on brilinta but bleeding/bruising was worse so taken off of it and plavix added A/P:  Right sided chest pain in patient with easy bruising/bleeding on plavix- suspect this may be a hematoma with surrounding bruising/bleeding.  Suspect spontaneous rupture of an underlying cancer far less likely. Agreed to conservative care and close follow up.  Patient Instructions  I think the probability of malignancy/cancer is likely rather low. I want you to Ice this area for about 20 minutes 3x a day for 3 days then switch to heat  Lets recheck this in 3 weeks- if not improving or worsens before that get ultrasound  Patient in pain today- will need to watch blood pressure. If remains up at physical may need increase of medication.   Return precautions advised.  Garret Reddish, MD

## 2016-08-26 NOTE — Patient Instructions (Signed)
I think the probability of malignancy/cancer is likely rather low. I want you to Ice this area for about 20 minutes 3x a day for 3 days then switch to heat  Lets recheck this in 3 weeks- if not improving or worsens before that get ultrasound

## 2016-09-16 ENCOUNTER — Ambulatory Visit: Payer: Medicare Other | Admitting: Family Medicine

## 2016-10-02 ENCOUNTER — Ambulatory Visit (INDEPENDENT_AMBULATORY_CARE_PROVIDER_SITE_OTHER): Payer: Medicare Other | Admitting: Family Medicine

## 2016-10-02 ENCOUNTER — Encounter: Payer: Self-pay | Admitting: Family Medicine

## 2016-10-02 VITALS — BP 148/68 | HR 53 | Temp 98.0°F | Ht 71.75 in | Wt 218.6 lb

## 2016-10-02 DIAGNOSIS — I2510 Atherosclerotic heart disease of native coronary artery without angina pectoris: Secondary | ICD-10-CM

## 2016-10-02 DIAGNOSIS — Z0000 Encounter for general adult medical examination without abnormal findings: Secondary | ICD-10-CM | POA: Diagnosis not present

## 2016-10-02 DIAGNOSIS — Z9861 Coronary angioplasty status: Secondary | ICD-10-CM

## 2016-10-02 DIAGNOSIS — I10 Essential (primary) hypertension: Secondary | ICD-10-CM | POA: Diagnosis not present

## 2016-10-02 DIAGNOSIS — E785 Hyperlipidemia, unspecified: Secondary | ICD-10-CM | POA: Diagnosis not present

## 2016-10-02 DIAGNOSIS — L408 Other psoriasis: Secondary | ICD-10-CM | POA: Diagnosis not present

## 2016-10-02 MED ORDER — AMLODIPINE BESYLATE 5 MG PO TABS: 5.0000 mg | ORAL_TABLET | Freq: Every day | ORAL | 3 refills | Status: DC

## 2016-10-02 NOTE — Assessment & Plan Note (Signed)
S: controlled poorly on losartan 100mg , spironolactone 25mg , coreg 6.25mg  BID. Poor control on last few checks . Dr. Martinique in June increased losartan to 100mg  and suggested adding amlodipine if needed. Brings home #s and systolics all over 472- some into even 170s or 180s and at least averaging in 150s.  BP Readings from Last 3 Encounters:  10/02/16 (!) 148/68  08/26/16 (!) 144/66  08/14/16 (!) 158/70  A/P: We discussed blood pressure goal of <140/90 with CAD. Continue current meds:  But add amlodipine 5mg  and follow up in 3 weeks.

## 2016-10-02 NOTE — Patient Instructions (Addendum)
add amlodipine 5mg  and follow up in 3 weeks.   Irrigation today Can trial mineral oil (in laxative aisle) 3-4 drops up to 3x a day when ears get clogged. Keep in ear for 30 seconds then drain out onto cotton ball  Please call urology for follow up- would go ahead and call today  Also need to call GI for follow up colonoscopy  Consider shingrix to prevent shingles at your pharmacy- send [place] a message if you get this there.   Schedule a lab visit at the check out desk within 2 weeks. Return for future fasting labs meaning nothing but water after midnight please. Ok to take your medications with water.

## 2016-10-02 NOTE — Assessment & Plan Note (Signed)
Psoriasis- recurrent spot on ankle but goes away at times. Not currently active

## 2016-10-02 NOTE — Assessment & Plan Note (Signed)
CAD- follows with cardiology on statin and plavix. Asymptomatic. Needs tighter bp control

## 2016-10-02 NOTE — Progress Notes (Signed)
Phone: [phone]  Subjective:  Patient presents today for their annual physical. Chief complaint-noted.   See problem oriented charting- ROS- full  review of systems was completed and negative including No chest pain or shortness of breath. No headache or blurry vision. Some fatigue at times. Some poor sleep due to nocturia.   The following were reviewed and entered/updated in epic: Past Medical History:  Diagnosis Date  . Adenocarcinoma of prostate [facility])    XRT & Radiation seed implantation in 2010  . Adenomatous colon polyp   . CAD (coronary artery disease)    a. 11/04/15:  Acute inferolateral STEMI: S/p emergent DES of a very large LCx with extensive thrombus. Significant residual disease in the proximal LAD and distal RCA. S/p staged PCI of LAD and RCA 8/29.  Marland Kitchen CVA (cerebral infarction)    a. 09/8240: embolic CVA after heart cath   . CVA (cerebral infarction)    a. 05/5359: embolic CVA after heart cath   . DIVERTICULITIS, HX OF 11/27/2006       . DJD (degenerative joint disease)    wrist - R   . Hernia   . History of blood transfusion 1985   with colon surgery  . Hypertension   . Ischemic cardiomyopathy    a. EF 25-30% by LV gram on cath 11/04/15. Appeared out of proportion to infarct although infarct was large. EF improved by Echo and now 40-45%.  . NEPHROLITHIASIS, HX OF 11/27/2006        . Peripheral neuropathy   . Psoriasis   . Tenosynovitis    Patient Active Problem List   Diagnosis Date Noted  . CAD S/P PCI     Priority: High  . Ischemic cardiomyopathy     Priority: High  . History of embolic stroke     Priority: High  . Primary hyperparathyroidism ([place]) 12/28/2013    Priority: High  . Fatigue 12/01/2013    Priority: High  . History of prostate cancer 12/14/2008    Priority: High  . Dyslipidemia 11/16/2015    Priority: Medium  . History of adenomatous polyp of colon 01/18/2015    Priority: Medium  . PSORIASIS 10/15/2007    Priority: Medium  . Essential  hypertension 11/27/2006    Priority: Medium  . NSVT (nonsustained ventricular tachycardia) ([place]) 11/08/2015    Priority: Low  . History of stomach cancer 12/01/2013    Priority: Low  . Lower back pain 03/24/2013    Priority: Low  . Bradycardia 09/23/2012    Priority: Low  . Bruising 09/23/2012    Priority: Low  . B12 deficiency 09/17/2010    Priority: Low  . PERIPHERAL NEUROPATHY 10/18/2007    Priority: Low  . DEGENERATIVE JOINT DISEASE 11/27/2006    Priority: Low  . NEPHROLITHIASIS, HX OF 11/27/2006    Priority: Low   Past Surgical History:  Procedure Laterality Date  . APPENDECTOMY  1985  . BACK SURGERY  1980   minor disk surgery: instrumentation placed and removed in a second procedure  . CARDIAC CATHETERIZATION N/A 11/04/2015   Procedure: Left Heart Cath and Coronary Angiography;  Surgeon: Peter M Martinique, MD;  Location: [place] [facility];  Service: Cardiovascular;  Laterality: N/A;  . CARDIAC CATHETERIZATION N/A 11/04/2015   Procedure: Coronary Stent Intervention;  Surgeon: Peter M Martinique, MD;  Location: [place] [facility];  Service: Cardiovascular;  Laterality: N/A;  . CARDIAC CATHETERIZATION N/A 11/06/2015   Procedure: Coronary Stent Intervention;  Surgeon: Leonie Man, MD;  Location:  [place] [facility];  Service: Cardiovascular;  Laterality: N/A;  . CATARACT EXTRACTION, BILATERAL  2013  . CHOLECYSTECTOMY  1986  . COLON SURGERY  1980's   pt. had ileus, had colon surgery, requiring colostomy & then reversal & then dehisence of that wound & return to OR for repair & cholecystectomy    . COLONOSCOPY    . COLOSTOMY  1985   After colectomy for diverticulitis, pt. remarks during this surgery they "gave me the paddles two times  because of bleeding", pt. states it was unrelated to any anesthesia complication  . EYE SURGERY     /w IOL  . KNEE ARTHROSCOPY Left 2003  . KNEE SURGERY  1956   Left 1956, Right w/cartilage removed later  . PARATHYROIDECTOMY N/A 05/12/2014    Procedure: PARATHYROIDECTOMY;  Surgeon: Armandina Gemma, MD;  Location: [place];  Service: General;  Laterality: N/A;  . REVERSAL OF COLOSTOMY  1987  . TONSILLECTOMY  1946  . WRIST SURGERY Left    Remote complicated w/infection    Family History  Problem Relation Age of Onset  . Coronary artery disease Mother   . Alcohol abuse Father   . Cirrhosis Father   . Heart failure Sister   . Breast cancer Sister        W/involvement of right arm leading to amputation  . Coronary artery disease Brother   . Heart attack Brother   . Clotting disorder Brother   . Prostate cancer Neg Hx   . Colon cancer Neg Hx   . Diabetes Neg Hx   . Glaucoma Neg Hx   . Rectal cancer Neg Hx   . Esophageal cancer Neg Hx     Medications- reviewed and updated Current Outpatient Prescriptions  Medication Sig Dispense Refill  . acetaminophen (TYLENOL) 325 MG tablet Take 650 mg by mouth as needed.    Marland Kitchen aspirin EC 81 MG tablet Take 1 tablet (81 mg total) by mouth daily. 90 tablet 1  . atorvastatin (LIPITOR) 80 MG tablet Take 1 tablet (80 mg total) by mouth daily at 6 PM. 90 tablet 3  . beta carotene w/minerals (OCUVITE) tablet Take 1 tablet by mouth daily.    . carvedilol (COREG) 6.25 MG tablet Take 1 tablet (6.25 mg total) by mouth 2 (two) times daily with a meal. 180 tablet 3  . clopidogrel (PLAVIX) 75 MG tablet Take 1 tablet (75 mg total) by mouth daily. 30 tablet 6  . Cyanocobalamin (VITAMIN B-12) 5000 MCG TBDP Take 5,000 mcg by mouth daily.    Marland Kitchen losartan (COZAAR) 100 MG tablet Take 1 tablet (100 mg total) by mouth daily. 90 tablet 3  . nitroGLYCERIN (NITROSTAT) 0.4 MG SL tablet Place 1 tablet (0.4 mg total) under the tongue every 5 (five) minutes x 3 doses as needed for chest pain. 25 tablet 2  . pantoprazole (PROTONIX) 40 MG tablet Take 40 mg by mouth as needed.    Marland Kitchen spironolactone (ALDACTONE) 25 MG tablet Take 1 tablet (25 mg total) by mouth daily. 90 tablet 3  . amLODipine (NORVASC) 5 MG tablet Take 1 tablet (5  mg total) by mouth daily. 90 tablet 3   No current facility-administered medications for this visit.     Allergies-reviewed and updated Allergies  Allergen Reactions  . Bee Venom Swelling    Facial swelling  . Betadine [Povidone Iodine] Other (See Comments)    blistering    Social History   Social History  . Marital status: Married  Spouse name: N/A  . Number of children: 3  . Years of education: N/A   Social History Main Topics  . Smoking status: Former Smoker    Quit date: 03/11/1983  . Smokeless tobacco: Never Used  . Alcohol use 0.0 oz/week     Comment: RARE  . Drug use: No  . Sexual activity: Not Asked   Other Topics Concern  . None   Social History Narrative   [employer], [place].   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 Oropharynx normal TM normal on right with some cerumen in canal, occluded on left- irrigation by nursing staff Neck: no thyromegaly CV: RRR no murmurs rubs or gallops Lungs: CTAB no crackles, wheeze, rhonchi Abdomen: soft/nontender/nondistended/normal bowel sounds. No rebound or guarding. Surgical scars noted Ext: no edema Skin: warm, dry Neuro: grossly normal, moves all extremities, PERRLA Rectal deferred to urology  Assessment/Plan:  82 y.o. male presenting for annual physical.  Health Maintenance counseling: 1.  Anticipatory guidance: Patient counseled regarding regular dental exams q6 months, eye exams - yearly, wearing seatbelts.  2. Risk factor reduction:  Advised patient of need for regular exercise and diet rich and fruits and vegetables to reduce risk of heart attack and stroke. Exercise- daily walking or going to gym. Diet-weight stable- wife is very tight on diet- occasional splurges. Nice balanced Wt Readings from Last 3 Encounters:  10/02/16 218 lb 9.6 oz (99.2 kg)  08/26/16 219 lb 3.2 oz (99.4 kg)  08/14/16 218 lb (98.9 kg)  3. Immunizations/screenings/ancillary studies- discussed shingrix at pharmacy option  Immunization History  Administered Date(s) Administered  . Influenza Split 01/06/2012  . Influenza Whole 12/19/2008, 12/27/2009  . Influenza, High Dose Seasonal PF 12/21/2012, 12/22/2014  . Influenza,inj,Quad PF,36+ Mos 12/01/2013, 10/24/2015  . Pneumococcal Conjugate-13 09/14/2015  . Pneumococcal Polysaccharide-23 02/12/2009  . Td 03/12/2003  . Tdap 02/27/2014  4. Prostate cancer screening- follows with urology- history radioactive seeds  Lab Results  Component Value Date   PSA 0.01 (L) 12/15/2014   PSA 0.39 12/14/2008   PSA 7.77 (H) 03/15/2007   5. Colon cancer screening - due for colonoscopy due to prior large polyp removal again- he will schedule follow up- has been getting calls/letters 6. Skin cancer screening- advised regular sunscreen use. Denies worrisome skin lesions  Status of chronic or acute concerns   Fatigue/hyperparthyroidism- s/p parathyroidectomy   Fatigue- seems to have some coming and going but may be related to his age- not worsening pattern. Doubt cardiac csue  Blood stain in urine every so often- should be seeing urology soon- discussed he should discuss with them. He is going to call for follow up.   Ear wax  At times- Can trial  mineral oil (in laxative aisle) 3-4 drops up to 3x a day when ears get clogged. Keep in ear for 30 seconds then drain out  onto cottonball  Essential hypertension S: controlled poorly on losartan 100mg , spironolactone 25mg , coreg 6.25mg  BID. Poor control on last few checks . Dr. Martinique in June increased losartan to 100mg  and suggested adding amlodipine if needed. Brings home #s and systolics all over 503- some into even 170s or 180s and at least averaging in 150s.  BP Readings from Last 3 Encounters:  10/02/16 (!) 148/68  08/26/16 (!) 144/66  08/14/16 (!) 158/70  A/P: We discussed blood pressure goal of <140/90 with CAD. Continue current meds:  But add amlodipine 5mg  and follow up in 3 weeks.    Dyslipidemia HLD- Last LDL was 28 on atorvastatin 80mg - continue current meds. Update LDL only.   CAD S/P PCI CAD- follows with cardiology on statin and plavix. Asymptomatic. Needs tighter bp control  PSORIASIS Psoriasis- recurrent spot on ankle but goes away at times. Not currently active   Return in about 3 weeks (around 10/23/2016).  Orders Placed This Encounter  Procedures  . Comprehensive metabolic panel    [place]    Standing Status:   Future    Standing Expiration Date:   10/02/2017  . CBC with Differential/Platelet    Standing Status:   Future    Standing Expiration Date:   10/02/2017  . LDL cholesterol, direct    [place]    Standing Status:   Future    Standing Expiration Date:   10/02/2017    Meds ordered this encounter  Medications  . amLODipine (NORVASC) 5 MG tablet    Sig: Take 1 tablet (5 mg total) by mouth daily.    Dispense:  90 tablet    Refill:  3    Return precautions advised.   Garret Reddish, MD

## 2016-10-02 NOTE — Assessment & Plan Note (Signed)
HLD- Last LDL was 28 on atorvastatin 80mg - continue current meds. Update LDL only.

## 2016-10-03 DIAGNOSIS — R310 Gross hematuria: Secondary | ICD-10-CM | POA: Diagnosis not present

## 2016-10-16 ENCOUNTER — Other Ambulatory Visit (INDEPENDENT_AMBULATORY_CARE_PROVIDER_SITE_OTHER): Payer: Medicare Other

## 2016-10-16 DIAGNOSIS — E785 Hyperlipidemia, unspecified: Secondary | ICD-10-CM | POA: Diagnosis not present

## 2016-10-16 DIAGNOSIS — I2510 Atherosclerotic heart disease of native coronary artery without angina pectoris: Secondary | ICD-10-CM | POA: Diagnosis not present

## 2016-10-16 DIAGNOSIS — Z0000 Encounter for general adult medical examination without abnormal findings: Secondary | ICD-10-CM

## 2016-10-16 DIAGNOSIS — Z9861 Coronary angioplasty status: Secondary | ICD-10-CM

## 2016-10-16 DIAGNOSIS — I10 Essential (primary) hypertension: Secondary | ICD-10-CM | POA: Diagnosis not present

## 2016-10-16 DIAGNOSIS — R531 Weakness: Secondary | ICD-10-CM

## 2016-10-16 LAB — COMPREHENSIVE METABOLIC PANEL
ALK PHOS: 81 U/L (ref 39–117)
ALT: 11 U/L (ref 0–53)
AST: 13 U/L (ref 0–37)
Albumin: 3.6 g/dL (ref 3.5–5.2)
BILIRUBIN TOTAL: 1.1 mg/dL (ref 0.2–1.2)
BUN: 33 mg/dL — ABNORMAL HIGH (ref 6–23)
CO2: 28 mEq/L (ref 19–32)
Calcium: 8.7 mg/dL (ref 8.4–10.5)
Chloride: 105 mEq/L (ref 96–112)
Creatinine, Ser: 1.11 mg/dL (ref 0.40–1.50)
GFR: 67.68 mL/min (ref >60.00)
GLUCOSE: 91 mg/dL (ref 70–99)
POTASSIUM: 4.2 meq/L (ref 3.5–5.1)
Sodium: 139 mEq/L (ref 135–145)
TOTAL PROTEIN: 6.2 g/dL (ref 6.0–8.3)

## 2016-10-16 LAB — CBC WITH DIFFERENTIAL/PLATELET
Basophils Absolute: 0.1 10*3/uL (ref 0.0–0.1)
Basophils Relative: 0.6 % (ref 0.0–3.0)
EOS PCT: 2.7 % (ref 0.0–5.0)
Eosinophils Absolute: 0.2 10*3/uL (ref 0.0–0.7)
HCT: 38.6 % — ABNORMAL LOW (ref 39.0–52.0)
Hemoglobin: 12.6 g/dL — ABNORMAL LOW (ref 13.0–17.0)
LYMPHS ABS: 1 10*3/uL (ref 0.7–4.0)
Lymphocytes Relative: 11 % — ABNORMAL LOW (ref 12.0–46.0)
MCHC: 32.6 g/dL (ref 30.0–36.0)
MCV: 97.6 fl (ref 78.0–100.0)
MONO ABS: 0.9 10*3/uL (ref 0.1–1.0)
Monocytes Relative: 10.9 % (ref 3.0–12.0)
Neutro Abs: 6.5 10*3/uL (ref 1.4–7.7)
Neutrophils Relative %: 74.8 % (ref 43.0–77.0)
PLATELETS: 247 10*3/uL (ref 150.0–400.0)
RBC: 3.95 Mil/uL — AB (ref 4.22–5.81)
RDW: 15.5 % (ref 11.5–15.5)
WBC: 8.7 10*3/uL (ref 4.0–10.5)

## 2016-10-16 LAB — BASIC METABOLIC PANEL
BUN: 33 mg/dL — AB (ref 6–23)
CHLORIDE: 105 meq/L (ref 96–112)
CO2: 28 meq/L (ref 19–32)
Calcium: 8.7 mg/dL (ref 8.4–10.5)
Creatinine, Ser: 1.11 mg/dL (ref 0.40–1.50)
GFR: 67.68 mL/min (ref >60.00)
GLUCOSE: 91 mg/dL (ref 70–99)
Potassium: 4.2 mEq/L (ref 3.5–5.1)
Sodium: 139 mEq/L (ref 135–145)

## 2016-10-16 LAB — LDL CHOLESTEROL, DIRECT: LDL DIRECT: 28 mg/dL

## 2016-10-23 ENCOUNTER — Ambulatory Visit (INDEPENDENT_AMBULATORY_CARE_PROVIDER_SITE_OTHER): Payer: Medicare Other | Admitting: Family Medicine

## 2016-10-23 ENCOUNTER — Encounter: Payer: Self-pay | Admitting: Family Medicine

## 2016-10-23 VITALS — BP 120/60 | HR 53 | Temp 98.0°F | Ht 71.75 in | Wt 216.8 lb

## 2016-10-23 DIAGNOSIS — G8929 Other chronic pain: Secondary | ICD-10-CM | POA: Diagnosis not present

## 2016-10-23 DIAGNOSIS — I10 Essential (primary) hypertension: Secondary | ICD-10-CM | POA: Diagnosis not present

## 2016-10-23 DIAGNOSIS — M545 Low back pain, unspecified: Secondary | ICD-10-CM

## 2016-10-23 MED ORDER — DICLOFENAC SODIUM 1 % TD GEL: 2.0000 g | Freq: Four times a day (QID) | TRANSDERMAL | 3 refills | Status: DC

## 2016-10-23 NOTE — Progress Notes (Signed)
Subjective:  Jose Ware is a 82 y.o. year old very pleasant male patient who presents for/with See problem oriented charting ROS- no urinary or fecal incontinence. No saddle anesthesia. No chest pain or shortness of breath.   Past Medical History-  Patient Active Problem List   Diagnosis Date Noted  . CAD S/P PCI     Priority: High  . Ischemic cardiomyopathy     Priority: High  . History of embolic stroke     Priority: High  . Primary hyperparathyroidism ([place]) 12/28/2013    Priority: High  . Fatigue 12/01/2013    Priority: High  . History of prostate cancer 12/14/2008    Priority: High  . Dyslipidemia 11/16/2015    Priority: Medium  . History of adenomatous polyp of colon 01/18/2015    Priority: Medium  . PSORIASIS 10/15/2007    Priority: Medium  . Essential hypertension 11/27/2006    Priority: Medium  . NSVT (nonsustained ventricular tachycardia) ([place]) 11/08/2015    Priority: Low  . History of stomach cancer 12/01/2013    Priority: Low  . Lower back pain 03/24/2013    Priority: Low  . Bradycardia 09/23/2012    Priority: Low  . Bruising 09/23/2012    Priority: Low  . B12 deficiency 09/17/2010    Priority: Low  . PERIPHERAL NEUROPATHY 10/18/2007    Priority: Low  . DEGENERATIVE JOINT DISEASE 11/27/2006    Priority: Low  . NEPHROLITHIASIS, HX OF 11/27/2006    Priority: Low    Medications- reviewed and updated Current Outpatient Prescriptions  Medication Sig Dispense Refill  . acetaminophen (TYLENOL) 325 MG tablet Take 650 mg by mouth as needed.    Marland Kitchen amLODipine (NORVASC) 5 MG tablet Take 1 tablet (5 mg total) by mouth daily. 90 tablet 3  . aspirin EC 81 MG tablet Take 1 tablet (81 mg total) by mouth daily. 90 tablet 1  . atorvastatin (LIPITOR) 80 MG tablet Take 1 tablet (80 mg total) by mouth daily at 6 PM. 90 tablet 3  . beta carotene w/minerals (OCUVITE) tablet Take 1 tablet by mouth daily.    . carvedilol (COREG) 6.25 MG tablet Take 1 tablet (6.25 mg  total) by mouth 2 (two) times daily with a meal. 180 tablet 3  . clopidogrel (PLAVIX) 75 MG tablet Take 1 tablet (75 mg total) by mouth daily. 30 tablet 6  . Cyanocobalamin (VITAMIN B-12) 5000 MCG TBDP Take 5,000 mcg by mouth daily.    Marland Kitchen losartan (COZAAR) 100 MG tablet Take 1 tablet (100 mg total) by mouth daily. 90 tablet 3  . nitroGLYCERIN (NITROSTAT) 0.4 MG SL tablet Place 1 tablet (0.4 mg total) under the tongue every 5 (five) minutes x 3 doses as needed for chest pain. 25 tablet 2  . pantoprazole (PROTONIX) 40 MG tablet Take 40 mg by mouth as needed.    Marland Kitchen spironolactone (ALDACTONE) 25 MG tablet Take 1 tablet (25 mg total) by mouth daily. 90 tablet 3   No current facility-administered medications for this visit.     Objective: BP 120/60 (BP Location: Left Arm, Patient Position: Sitting, Cuff Size: Large)   Pulse (!) 53   Temp 98 F (36.7 C) (Oral)   Ht 5' 11.75" (1.822 m)   Wt 216 lb 12.8 oz (98.3 kg)   SpO2 95%   BMI 29.61 kg/m  Gen: NAD, resting comfortably CV: RRR no murmurs rubs or gallops Lungs: CTAB no crackles, wheeze, rhonchi Abdomen: soft/nontender/nondistended/normal bowel sounds.overweight  Ext: no edema Skin:  warm, dry Neuro: good strength in legs Left low back- not tender with palpation in area he reports pain  Assessment/Plan:  Blood stain on underwear near penile tip- saw urology and no blood seen on urine- they are planning on monitoring as long as not increasing or other new symptoms  Essential hypertension S: controlled on losartan 100mg , spironolactone 25mg , coreg 6.25mg  BID, amlodipine 5mg  added last visit. Home #s last visit averaging around 150- today at least closer to 140.  BP Readings from Last 3 Encounters:  10/23/16 120/60  10/02/16 (!) 148/68  08/26/16 (!) 144/66  A/P: We discussed blood pressure goal of <140/90. Continue current meds. Home cuff could be off slightly- will recheck next visit- encouraged him to bring with him to visit  Lower  back pain S: back pain with standing for several years- after falling off roof into snowback years ago with surgery 1961. Uses sparing excedrin but does not help like aleve used to- has not used aleve since heart attack as on plavix A/P: will trial voltaren gel if covered- not sure if it will be effective for low back but worth a trial. Encouraged him to avoid aleve more than once a week and we discussed bleeding risks within on plavix and some increased heart risks  Return in about 6 months (around 04/25/2017) for follow up- or sooner if needed.  Meds ordered this encounter  Medications  . diclofenac sodium (VOLTAREN) 1 % GEL    Sig: Apply 2 g topically 4 (four) times daily.    Dispense:  100 g    Refill:  3   Return precautions advised.  Garret Reddish, MD

## 2016-10-23 NOTE — Patient Instructions (Signed)
Blood pressure looks fantastic in office. Continue current meds. High at home but trending down- bring home cuff to next visit so we can compare  Trial gel for the low back pain if not expensive. If costly- I think a trial of very sparing aleve- lets say once a week max would be ok though does increase bleeding risk some as well as heart risk

## 2016-10-23 NOTE — Assessment & Plan Note (Signed)
S: controlled on losartan 100mg , spironolactone 25mg , coreg 6.25mg  BID, amlodipine 5mg  added last visit. Home #s last visit averaging around 150- today at least closer to 140.  BP Readings from Last 3 Encounters:  10/23/16 120/60  10/02/16 (!) 148/68  08/26/16 (!) 144/66  A/P: We discussed blood pressure goal of <140/90. Continue current meds. Home cuff could be off slightly- will recheck next visit- encouraged him to bring with him to visit

## 2016-10-23 NOTE — Assessment & Plan Note (Signed)
S: back pain with standing for several years- after falling off roof into snowback years ago with surgery 1961. Uses sparing excedrin but does not help like aleve used to- has not used aleve since heart attack as on plavix A/P: will trial voltaren gel if covered- not sure if it will be effective for low back but worth a trial. Encouraged him to avoid aleve more than once a week and we discussed bleeding risks within on plavix and some increased heart risks

## 2016-10-30 ENCOUNTER — Other Ambulatory Visit: Payer: Self-pay | Admitting: Cardiology

## 2016-10-31 ENCOUNTER — Telehealth: Payer: Self-pay

## 2016-10-31 NOTE — Telephone Encounter (Signed)
Received PA request for Voltaren. PA submitted & approved. Form faxed back to pharmacy.

## 2016-11-06 ENCOUNTER — Encounter: Payer: Self-pay | Admitting: Family Medicine

## 2016-11-24 IMAGING — NM NM PARATHYROID W/ SPECT
6 series · 21 of 21 positions shown · non-contrast
Comparison: None.

CLINICAL DATA: Hyperparathyroidism

EXAM:
NM PARATHYROID SCINTIGRAPHY AND SPECT IMAGING
TECHNIQUE: Following intravenous administration of radiopharmaceutical, early
and 2-hour delayed planar images were obtained in the anterior
projection. Delayed triplanar SPECT images were also obtained at 2
hours.
RADIOPHARMACEUTICALS:  25.0 mKiPc-FFm Sestamibi IV

[Series 1: spect - (id)_(id)_tra · 8.3mm · 8.28mm/px · 6 of 64 frames shown]
[frame 6/64]
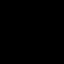
[frame 16/64]
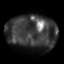
[frame 27/64]
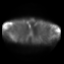
[frame 38/64]
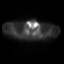
[frame 48/64]
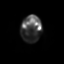
[frame 59/64]
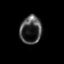

[Series 1: 15 min ant · 4.14mm/px · 1 of 1 slices shown]
[im 1/1]
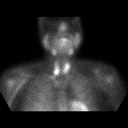

[Series 1: spect - (id)_(id)_cor · 8.3mm · 8.28mm/px · 6 of 64 frames shown]
[frame 6/64]
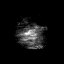
[frame 16/64]
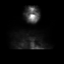
[frame 27/64]
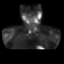
[frame 38/64]
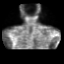
[frame 48/64]
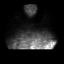
[frame 59/64]
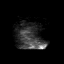

[Series 2: 1 hr ant · 4.14mm/px · 1 of 1 slices shown]
[im 1/1]
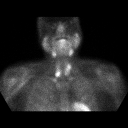

[Series 3: 2 hr ant · 4.14mm/px · 1 of 1 slices shown]
[im 1/1]
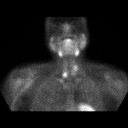

[Series 4: spect parathyroid · 8.28mm/px · 6 of 64 frames shown]
[frame 6/64]
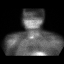
[frame 16/64]
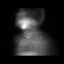
[frame 27/64]
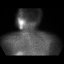
[frame 38/64]
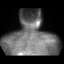
[frame 48/64]
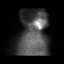
[frame 59/64]
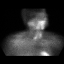

[21 of 21 positions shown; findings below may reference images not displayed]

FINDINGS: On the early images there is radiotracer activity in both lobes of
the thyroid gland. A dominant focus of increased uptake localizes to
the region of the inferior pole of the right lobe of thyroid gland.
On the washout images the dominant focus near the inferior pole of
the right lobe persists. A second focus of persistent activity
localizes to the upper pole of the left lobe but is less intense.
IMPRESSION: 1. Parathyroid adenoma localizes to the region near the inferior
pole of the left lobe of thyroid gland. This is compatible with a
parathyroid adenoma.
2. A second focus of persistent radiotracer activity localizes to
the region near the upper pole of the left lobe of thyroid gland. A
second parathyroid adenoma is not excluded.

## 2016-11-28 ENCOUNTER — Ambulatory Visit (INDEPENDENT_AMBULATORY_CARE_PROVIDER_SITE_OTHER): Payer: Medicare Other | Admitting: Gastroenterology

## 2016-11-28 ENCOUNTER — Telehealth: Payer: Self-pay | Admitting: *Deleted

## 2016-11-28 ENCOUNTER — Encounter: Payer: Self-pay | Admitting: Gastroenterology

## 2016-11-28 VITALS — BP 132/70 | HR 74 | Ht 72.0 in | Wt 216.0 lb

## 2016-11-28 DIAGNOSIS — Z86010 Personal history of colonic polyps: Secondary | ICD-10-CM | POA: Diagnosis not present

## 2016-11-28 DIAGNOSIS — Z7902 Long term (current) use of antithrombotics/antiplatelets: Secondary | ICD-10-CM

## 2016-11-28 MED ORDER — SUPREP BOWEL PREP KIT 17.5-3.13-1.6 GM/177ML PO SOLN: 1.0000 | ORAL | 0 refills | Status: DC

## 2016-11-28 NOTE — Progress Notes (Signed)
    History of Present Illness: This is an 82 year male with history of a TVA in 01/2015 removed by piecemeal and a TA removed by piecemeal polypectomy in 08/2015. He is status post STEMI in August 2017 and underwent PCI with DES placement. He is currently taking Plavix and ASA. He has been stable from a cardiac standpoint. He states he has 2-3 soft to looser stools per day and this has been a long time bowel pattern. No changes.  Colonoscopy 08/2015 - One 15 mm polyp in the transverse colon, removed piecemeal using a hot snare. Resected and retrieved. Injected. - Two tattoos were noted in the transverse colon. - One 6 mm polyp in the proximal transverse colon, removed with a cold snare. Resected and retrieved. - Mild diverticulosis in the sigmoid colon. - Internal hemorrhoids.   Current Medications, Allergies, Past Medical History, Past Surgical History, Family History and Social History were reviewed in [employer] record.  Physical Exam: General: Well developed, well nourished, no acute distress Head: Normocephalic and atraumatic Eyes:  sclerae anicteric, EOMI Ears: Normal auditory acuity Mouth: No deformity or lesions Lungs: Clear throughout to auscultation Heart: Regular rate and rhythm; no murmurs, rubs or bruits Abdomen: Soft, non tender and non distended. No masses, hepatosplenomegaly or hernias noted. Normal Bowel sounds Rectal: Deferred to colonoscopy Musculoskeletal: Symmetrical with no gross deformities  Pulses:  Normal pulses noted Extremities: No clubbing, cyanosis, edema or deformities noted Neurological: Alert oriented x 4, grossly nonfocal Psychological:  Alert and cooperative. Normal mood and affect  Assessment and Recommendations:  1. Personal history of tubulovillous adenoma and tubular adenoma both removed by piecemeal polypectomy in 01/2015 and 08/2015 respectively. He is due for colonoscopy to assess completeness of the 2017 piecemeal  polypectomy. Colonoscopy delayed due to cardiac problems and antiplatelet agents.  2. CAD with DES and status post STEMI in August 2017. Currently taking Plavix and ASA. He is advised to remain on his daily aspirin. Hold Plavix 5 days before procedure - will instruct when and how to resume after procedure. Low but real risk of cardiovascular event such as heart attack, stroke, embolism, thrombosis or ischemia/infarct of other organs off Plavix explained and need to seek urgent help if this occurs. The patient consents to proceed. Will communicate by phone or EMR with patient's prescribing provider to confirm that holding Plavix is reasonable in this case.

## 2016-11-28 NOTE — Telephone Encounter (Signed)
   ZAEVION PARKE Apr 13, 1934 [id number]  Dear Dr Martinique:  We have scheduled the above named patient for a(n) colonoscopy procedure. Our records show that (s)he is on anticoagulation therapy.  Please advise as to whether the patient may come off their therapy of plavix 5 days prior to their procedure which is scheduled for 01/20/17.  Please route your response to Dixon Boos .  Sincerely,  [facility]

## 2016-11-28 NOTE — Telephone Encounter (Signed)
Patient returned phone call. Best # [phone]

## 2016-11-28 NOTE — Telephone Encounter (Signed)
He may hold Plavix for one week prior to colonoscopy  Deb Loudin Martinique MD, [facility]

## 2016-11-28 NOTE — Patient Instructions (Signed)
You have been scheduled for a colonoscopy. Please follow written instructions given to you at your visit today.  Please pick up your prep supplies at the pharmacy within the next 1-3 days. If you use inhalers (even only as needed), please bring them with you on the day of your procedure. Your physician has requested that you go to www.startemmi.com and enter the access code given to you at your visit today. This web site gives a general overview about your procedure. However, you should still follow specific instructions given to you by our office regarding your preparation for the procedure.  You will be contacted by our office prior to your procedure for directions on holding your Plavix.  If you do not hear from our office 1 week prior to your scheduled procedure, please call [phone] to discuss.   If you are age 80 or older, your body mass index should be between 23-30. Your Body mass index is 29.29 kg/m. If this is out of the aforementioned range listed, please consider follow up with your Primary Care Provider.  If you are age 58 or younger, your body mass index should be between 19-25. Your Body mass index is 29.29 kg/m. If this is out of the aformentioned range listed, please consider follow up with your Primary Care Provider.

## 2016-11-28 NOTE — Telephone Encounter (Signed)
Left message with male for patient to call back. 

## 2016-11-28 NOTE — Telephone Encounter (Signed)
I have spoken to patient to Jose Ware that per Dr Martinique, he may hold Plavix 5 days prior to procedure. Patient verbalizes understanding.

## 2016-11-28 NOTE — Telephone Encounter (Signed)
Left voicemail for patient to call back. 

## 2017-01-02 ENCOUNTER — Telehealth: Payer: Self-pay | Admitting: Family Medicine

## 2017-01-02 ENCOUNTER — Encounter: Payer: Self-pay | Admitting: Physician Assistant

## 2017-01-02 ENCOUNTER — Ambulatory Visit (INDEPENDENT_AMBULATORY_CARE_PROVIDER_SITE_OTHER): Payer: Medicare Other | Admitting: Physician Assistant

## 2017-01-02 VITALS — BP 136/68 | HR 49 | Temp 99.2°F | Wt 218.4 lb

## 2017-01-02 DIAGNOSIS — L03115 Cellulitis of right lower limb: Secondary | ICD-10-CM | POA: Diagnosis not present

## 2017-01-02 MED ORDER — DOXYCYCLINE HYCLATE 100 MG PO TABS: 100.0000 mg | ORAL_TABLET | Freq: Two times a day (BID) | ORAL | 0 refills | Status: DC

## 2017-01-02 NOTE — Patient Instructions (Signed)
It was great to meet you!  Please start the oral antibiotic, doxycycline.  Follow these instructions at home:  Take over-the-counter and prescription medicines only as told by your health care provider.  If you were prescribed an antibiotic medicine, take it as told by your health care provider. Do not stop taking the antibiotic even if you start to feel better.  Drink enough fluid to keep your urine clear or pale yellow.  Do not touch or rub the infected area.  Raise (elevate) the infected area above the level of your heart while you are sitting or lying down.  Apply warm or cold compresses to the affected area as told by your health care provider.  Keep all follow-up visits as told by your health care provider. This is important. These visits let your health care provider make sure a more serious infection is not developing. Contact a health care provider if:  You have a fever.  Your symptoms do not improve within 1-2 days of starting treatment.  Your bone or joint underneath the infected area becomes painful after the skin has healed.  Your infection returns in the same area or another area.  You notice a swollen bump in the infected area.  You develop new symptoms.  You have a general ill feeling (malaise) with muscle aches and pains. Get help right away if:  Your symptoms get worse.  You feel very sleepy.  You develop vomiting or diarrhea that persists.  You notice red streaks coming from the infected area.  Your red area gets larger or turns dark in color.

## 2017-01-02 NOTE — Telephone Encounter (Signed)
Patient is being seen at 10am with [facility] for rash on ankle (painful, burning)-flu shot requested.

## 2017-01-02 NOTE — Telephone Encounter (Signed)
Noted  

## 2017-01-02 NOTE — Progress Notes (Signed)
Jose Ware is a 82 y.o. male here for a new problem. History of Present Illness:   Chief Complaint  Patient presents with  . Rash    r ankle    HPI   3-4 days ago patient noticed R posterior lower leg started burning and was painful. He also noticed weeping. Has not had fevers that he has noticed. Has not used any treatment. No numbness or tingling in foot. Unsure if he has a skin infection in the past. No hx of tick bites. Does have a pertinent hx of psoriasis, typically uses Clobetasol for this, but has not applied any to this area. Denies calf pain, recent travel, hx of DVT, chest pain, SOB.   Past Medical History:  Diagnosis Date  . Adenocarcinoma of prostate [facility])    XRT & Radiation seed implantation in 2010  . Adenomatous colon polyp   . CAD (coronary artery disease)    a. 11/04/15:  Acute inferolateral STEMI: S/p emergent DES of a very large LCx with extensive thrombus. Significant residual disease in the proximal LAD and distal RCA. S/p staged PCI of LAD and RCA 8/29.  Marland Kitchen CVA (cerebral infarction)    a. 08/452: embolic CVA after heart cath   . CVA (cerebral infarction)    a. 0/9811: embolic CVA after heart cath   . DIVERTICULITIS, HX OF 11/27/2006       . DJD (degenerative joint disease)    wrist - R   . Hernia   . History of blood transfusion 1985   with colon surgery  . Hypertension   . Ischemic cardiomyopathy    a. EF 25-30% by LV gram on cath 11/04/15. Appeared out of proportion to infarct although infarct was large. EF improved by Echo and now 40-45%.  . NEPHROLITHIASIS, HX OF 11/27/2006        . Peripheral neuropathy   . Psoriasis   . Tenosynovitis      Social History   Social History  . Marital status: Married    Spouse name: N/A  . Number of children: 3  . Years of education: N/A   Occupational History  . Not on file.   Social History Main Topics  . Smoking status: Former Smoker    Quit date: 03/11/1983  . Smokeless tobacco: Never Used  .  Alcohol use 0.0 oz/week     Comment: RARE  . Drug use: No  . Sexual activity: Not on file   Other Topics Concern  . Not on file   Social History Narrative   [facility] [place], [place].   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 APPENDECTOMY  1985  . BACK SURGERY  1980   minor disk surgery: instrumentation placed and removed in a second procedure  . CARDIAC CATHETERIZATION N/A 11/04/2015   Procedure: Left Heart Cath and Coronary Angiography;  Surgeon: Peter M Martinique, MD;  Location: [place] [facility];  Service: Cardiovascular;  Laterality: N/A;  . CARDIAC CATHETERIZATION N/A 11/04/2015   Procedure: Coronary Stent  Intervention;  Surgeon: Peter M Martinique, MD;  Location: [place] [facility];  Service: Cardiovascular;  Laterality: N/A;  . CARDIAC CATHETERIZATION N/A 11/06/2015   Procedure: Coronary Stent Intervention;  Surgeon: Leonie Man, MD;  Location: [place] [facility];  Service: Cardiovascular;  Laterality: N/A;  . CATARACT EXTRACTION, BILATERAL  2013  . CHOLECYSTECTOMY  1986  . COLON SURGERY  1980's   pt. had ileus, had colon surgery, requiring colostomy & then reversal & then dehisence of that wound & return to OR for repair & cholecystectomy    . COLONOSCOPY    . COLOSTOMY  1985   After colectomy for diverticulitis, pt. remarks during this surgery they "gave me the paddles two times  because of bleeding", pt. states it was unrelated to any anesthesia complication  . EYE SURGERY     /w IOL  . KNEE ARTHROSCOPY Left 2003  . KNEE SURGERY  1956   Left 1956, Right w/cartilage removed later  . PARATHYROIDECTOMY N/A  05/12/2014   Procedure: PARATHYROIDECTOMY;  Surgeon: Armandina Gemma, MD;  Location: [place];  Service: General;  Laterality: N/A;  . REVERSAL OF COLOSTOMY  1987  . TONSILLECTOMY  1946  . WRIST SURGERY Left    Remote complicated w/infection    Family History  Problem Relation Age of Onset  . Coronary artery disease Mother   . Alcohol abuse Father   . Cirrhosis Father   . Heart failure Sister   . Breast cancer Sister        W/involvement of right arm leading to amputation  . Coronary artery disease Brother   . Heart attack Brother   . Clotting disorder Brother   . Prostate cancer Neg Hx   . Colon cancer Neg Hx   . Diabetes Neg Hx   . Glaucoma Neg Hx   . Rectal cancer Neg Hx   . Esophageal cancer Neg Hx     Allergies  Allergen Reactions  . Bee Venom Swelling    Facial swelling  . Betadine [Povidone Iodine] Other (See Comments)    blistering    Current Medications:   Current Outpatient Prescriptions:  .  acetaminophen (TYLENOL) 325 MG tablet, Take 650 mg by mouth as needed., Disp: , Rfl:  .  amLODipine (NORVASC) 5 MG tablet, Take 1 tablet (5 mg total) by mouth daily., Disp: 90 tablet, Rfl: 3 .  aspirin EC 81 MG tablet, Take 1 tablet (81 mg total) by mouth daily., Disp: 90 tablet, Rfl: 1 .  atorvastatin (LIPITOR) 80 MG tablet, TAKE ONE TABLET BY MOUTH ONCE DAILY AT  6  PM, Disp: 90 tablet, Rfl: 3 .  beta carotene w/minerals (OCUVITE) tablet, Take 1 tablet by mouth daily., Disp: , Rfl:  .  carvedilol (COREG) 6.25 MG tablet, TAKE ONE TABLET BY MOUTH TWICE DAILY WITH MEALS, Disp: 180 tablet, Rfl: 3 .  clopidogrel (PLAVIX) 75 MG tablet, Take 1 tablet (75 mg total) by mouth daily., Disp: 30 tablet, Rfl: 6 .  Cyanocobalamin (VITAMIN B-12) 5000 MCG TBDP, Take 5,000 mcg by mouth daily., Disp: , Rfl:  .  losartan (COZAAR) 100 MG tablet, Take 1 tablet (100 mg total) by mouth daily., Disp: 90 tablet, Rfl: 3 .  nitroGLYCERIN (NITROSTAT) 0.4 MG SL tablet, Place 1 tablet (0.4 mg total) under the  tongue every 5 (five) minutes x 3 doses as needed for chest pain., Disp: 25 tablet, Rfl: 2 .  pantoprazole (PROTONIX) 40 MG tablet, Take 40 mg by mouth as needed., Disp: ,  Rfl:  .  spironolactone (ALDACTONE) 25 MG tablet, TAKE ONE TABLET BY MOUTH ONCE DAILY, Disp: 90 tablet, Rfl: 3 .  SUPREP BOWEL PREP KIT 17.5-3.13-1.6 GM/180ML SOLN, Take 1 kit by mouth as directed., Disp: 354 mL, Rfl: 0 .  doxycycline (VIBRA-TABS) 100 MG tablet, Take 1 tablet (100 mg total) by mouth 2 (two) times daily., Disp: 20 tablet, Rfl: 0   Review of Systems:   ROS  Negative unless otherwise specified per HPI.  Vitals:   Vitals:   01/02/17 0950  BP: 136/68  Pulse: (!) 49  Temp: 99.2 F (37.3 C)  TempSrc: Oral  SpO2: 96%  Weight: 218 lb 6 oz (99.1 kg)     Body mass index is 29.62 kg/m.  Physical Exam:   Physical Exam  Constitutional: He appears well-developed. He is cooperative.  Non-toxic appearance. He does not have a sickly appearance. He does not appear ill. No distress.  Cardiovascular: Normal rate, regular rhythm, S1 normal, S2 normal, normal heart sounds and normal pulses.   Pulses:      Dorsalis pedis pulses are 2+ on the right side, and 2+ on the left side.  No LE edema; Adequate capillary refill  Pulmonary/Chest: Effort normal and breath sounds normal.  Neurological: He is alert. GCS eye subscore is 4. GCS verbal subscore is 5. GCS motor subscore is 6.  Skin: Skin is warm, dry and intact.  Posterior lower R leg, distal to calf muscle, with area of warmth and redness, does not streak or travel up or down extremity. No active discharge. Slight tenderness with palpation. No tenderness with deep palpation of bilateral calves.  Psychiatric: He has a normal mood and affect. His speech is normal and behavior is normal.  Nursing note and vitals reviewed.     Assessment and Plan:    Elridge was seen today for rash.  Diagnoses and all orders for this visit:  Cellulitis of right lower  extremity Patient also assessed by Dr. Briscoe Deutscher. Will initiate oral antibiotics, doxycycline per orders. Discussed with patient to not use his clobetasol cream in this area at this time. Provided return-to-clinic signs on AVS. Follow-up if symptoms worsen or persist. Patient verbalized understanding.   Other orders -     doxycycline (VIBRA-TABS) 100 MG tablet; Take 1 tablet (100 mg total) by mouth 2 (two) times daily.   . Reviewed expectations re: course of current medical issues. . Discussed self-management of symptoms. . Outlined signs and symptoms indicating need for more acute intervention. . Patient verbalized understanding and all questions were answered. . See orders for this visit as documented in the electronic medical record. . Patient received an After-Visit Summary.   Inda Coke, PA-C

## 2017-01-06 ENCOUNTER — Encounter: Payer: Self-pay | Admitting: Family Medicine

## 2017-01-06 ENCOUNTER — Encounter: Payer: Self-pay | Admitting: Gastroenterology

## 2017-01-10 ENCOUNTER — Other Ambulatory Visit: Payer: Self-pay | Admitting: Cardiology

## 2017-01-20 ENCOUNTER — Encounter: Payer: Medicare Other | Admitting: Gastroenterology

## 2017-01-26 ENCOUNTER — Ambulatory Visit (INDEPENDENT_AMBULATORY_CARE_PROVIDER_SITE_OTHER): Payer: Medicare Other | Admitting: Family Medicine

## 2017-01-26 ENCOUNTER — Encounter: Payer: Self-pay | Admitting: Family Medicine

## 2017-01-26 VITALS — BP 134/70 | HR 53 | Temp 98.0°F | Ht 72.0 in | Wt 223.4 lb

## 2017-01-26 DIAGNOSIS — L03119 Cellulitis of unspecified part of limb: Secondary | ICD-10-CM | POA: Diagnosis not present

## 2017-01-26 DIAGNOSIS — L408 Other psoriasis: Secondary | ICD-10-CM | POA: Diagnosis not present

## 2017-01-26 DIAGNOSIS — R3121 Asymptomatic microscopic hematuria: Secondary | ICD-10-CM | POA: Diagnosis not present

## 2017-01-26 MED ORDER — DOXYCYCLINE HYCLATE 100 MG PO TABS: 100.0000 mg | ORAL_TABLET | Freq: Two times a day (BID) | ORAL | 0 refills | Status: DC

## 2017-01-26 MED ORDER — TRIAMCINOLONE ACETONIDE 0.5 % EX OINT: 1.0000 "application " | TOPICAL_OINTMENT | Freq: Two times a day (BID) | CUTANEOUS | 1 refills | Status: DC

## 2017-01-26 NOTE — Assessment & Plan Note (Signed)
Patch of psoriasis on bottom of lower leg has seemed to be source of cellulitis. Will be more aggressive about treatment after healing from cellulitis. Kenalog ointment 7-10 days will be used with repeat if needed after 10 day break. May need ultimately to refer to dermatology. At baseline does not appear to have large amount of venous insufficiency so doubt compression stockings would help.

## 2017-01-26 NOTE — Progress Notes (Signed)
Subjective:  Jose Ware is a 82 y.o. year old very pleasant male patient who presents for/with See problem oriented charting ROS- no fever or chills, does have expanding redness, warmth, redness on right lower leg.    Past Medical History-  Patient Active Problem List   Diagnosis Date Noted  . CAD S/P PCI     Priority: High  . Ischemic cardiomyopathy     Priority: High  . History of embolic stroke     Priority: High  . Primary hyperparathyroidism ([place]) 12/28/2013    Priority: High  . Fatigue 12/01/2013    Priority: High  . History of prostate cancer 12/14/2008    Priority: High  . Dyslipidemia 11/16/2015    Priority: Medium  . History of adenomatous polyp of colon 01/18/2015    Priority: Medium  . PSORIASIS 10/15/2007    Priority: Medium  . Essential hypertension 11/27/2006    Priority: Medium  . NSVT (nonsustained ventricular tachycardia) ([place]) 11/08/2015    Priority: Low  . History of stomach cancer 12/01/2013    Priority: Low  . Lower back pain 03/24/2013    Priority: Low  . Bradycardia 09/23/2012    Priority: Low  . Bruising 09/23/2012    Priority: Low  . B12 deficiency 09/17/2010    Priority: Low  . PERIPHERAL NEUROPATHY 10/18/2007    Priority: Low  . DEGENERATIVE JOINT DISEASE 11/27/2006    Priority: Low  . NEPHROLITHIASIS, HX OF 11/27/2006    Priority: Low    Medications- reviewed and updated Current Outpatient Medications  Medication Sig Dispense Refill  . acetaminophen (TYLENOL) 325 MG tablet Take 650 mg by mouth as needed.    Marland Kitchen amLODipine (NORVASC) 5 MG tablet Take 1 tablet (5 mg total) by mouth daily. 90 tablet 3  . aspirin EC 81 MG tablet Take 1 tablet (81 mg total) by mouth daily. 90 tablet 1  . atorvastatin (LIPITOR) 80 MG tablet TAKE ONE TABLET BY MOUTH ONCE DAILY AT  6  PM 90 tablet 3  . beta carotene w/minerals (OCUVITE) tablet Take 1 tablet by mouth daily.    . carvedilol (COREG) 6.25 MG tablet TAKE ONE TABLET BY MOUTH TWICE DAILY WITH  MEALS 180 tablet 3  . clopidogrel (PLAVIX) 75 MG tablet TAKE 1 TABLET BY MOUTH ONCE DAILY 30 tablet 6  . Cyanocobalamin (VITAMIN B-12) 5000 MCG TBDP Take 5,000 mcg by mouth daily.    Marland Kitchen doxycycline (VIBRA-TABS) 100 MG tablet Take 1 tablet (100 mg total) by mouth 2 (two) times daily. 20 tablet 0  . losartan (COZAAR) 100 MG tablet Take 1 tablet (100 mg total) by mouth daily. 90 tablet 3  . nitroGLYCERIN (NITROSTAT) 0.4 MG SL tablet Place 1 tablet (0.4 mg total) under the tongue every 5 (five) minutes x 3 doses as needed for chest pain. 25 tablet 2  . pantoprazole (PROTONIX) 40 MG tablet Take 40 mg by mouth as needed.    Marland Kitchen spironolactone (ALDACTONE) 25 MG tablet TAKE ONE TABLET BY MOUTH ONCE DAILY 90 tablet 3  . SUPREP BOWEL PREP KIT 17.5-3.13-1.6 GM/180ML SOLN Take 1 kit by mouth as directed. 354 mL 0   No current facility-administered medications for this visit.     Objective: Patient was not subjectively febrile at time of illness. Vital signs to be entered by clinical team on 01/27/17.  Gen: NAD, resting comfortably CV: RRR no murmurs rubs or gallops Lungs: CTAB no crackles, wheeze, rhonchi Abdomen: soft/nontender/nondistended/normal bowel sounds.  Ext: no edema on  left. 1+ edema on right. Back of left leg there is a patch of psoriasis- raised with grayish discoloration- erythema and warmth surrounding this and going up at least 18 cm on back of leg- spans 8 cm portion on front of leg. Area mildly tender and certainly warm to touch when compared to the left leg.  Skin: warm, dry Neuro: intact distal sensation in bilateral lower legs.   Assessment/Plan:  Recurrent cellulitis S: Patient treated for cellulitis about a month ago with 10 days of doxycycline. He states redness almost completely resolved but never fully went away. He states in both cases expansion started around baseline psoriatic plaque on right ankle. 3 days ago noted worsening redness around the plaque and started to spread up  the leg. States has burning sensation and feels warm to touch. Also has some itching in area. Now swelling has started and is worsening as well.  A/P: recurrent cellulitis- treat as noted below- extend doxycycline course. Well appearing and reports afebrile- will hold off on labs.  Patient Instructions  Doxycycline for 2 weeks for recurrent cellulitis  Sent in ointment to use if area completely clears up in regards to redness/warmth  Happy to see you in 2 weeks to recheck before you start the ointment if you prefer   PSORIASIS Patch of psoriasis on bottom of lower leg has seemed to be source of cellulitis. Will be more aggressive about treatment after healing from cellulitis. Kenalog ointment 7-10 days will be used with repeat if needed after 10 day break. May need ultimately to refer to dermatology. At baseline does not appear to have large amount of venous insufficiency so doubt compression stockings would help.    Future Appointments  Date Time Provider [place]  02/17/2017  4:20 PM Martinique, Peter M, MD [facility]  04/27/2017  8:45 AM Yong Channel Brayton Mars, MD [facility] None   Meds ordered this encounter  Medications  . doxycycline (VIBRA-TABS) 100 MG tablet    Sig: Take 1 tablet (100 mg total) 2 (two) times daily for 14 days by mouth.    Dispense:  28 tablet    Refill:  0  . triamcinolone ointment (KENALOG) 0.5 %    Sig: Apply 1 application 2 (two) times daily topically. 7-10 days then need 10 day break before retrialing.    Dispense:  30 g    Refill:  1    Return precautions advised.  Garret Reddish, MD

## 2017-01-26 NOTE — Patient Instructions (Addendum)
Doxycycline for 2 weeks for recurrent cellulitis  Sent in ointment to use if area completely clears up in regards to redness/warmth  Happy to see you in 2 weeks to recheck before you start the ointment if you prefer

## 2017-01-27 ENCOUNTER — Encounter: Payer: Self-pay | Admitting: Family Medicine

## 2017-02-01 ENCOUNTER — Encounter: Payer: Self-pay | Admitting: Family Medicine

## 2017-02-02 ENCOUNTER — Encounter: Payer: Self-pay | Admitting: Family Medicine

## 2017-02-02 ENCOUNTER — Ambulatory Visit (INDEPENDENT_AMBULATORY_CARE_PROVIDER_SITE_OTHER): Payer: Medicare Other | Admitting: Family Medicine

## 2017-02-02 VITALS — BP 150/80 | HR 56 | Temp 97.9°F | Ht 72.0 in | Wt 228.5 lb

## 2017-02-02 DIAGNOSIS — L03119 Cellulitis of unspecified part of limb: Secondary | ICD-10-CM

## 2017-02-02 DIAGNOSIS — R21 Rash and other nonspecific skin eruption: Secondary | ICD-10-CM | POA: Diagnosis not present

## 2017-02-02 NOTE — Patient Instructions (Addendum)
Have the front desk schedule you for 1 pm on Thursday  Stop doxycycline. I am concerned this may be a drug rash. I am strongly considering putting you on a different antibiotic keflex but want to hold off for now until we see how this goes- does appear cellulitis much improved.   If you have fever, far worsening rash, start feeling sick overall please see me back

## 2017-02-02 NOTE — Progress Notes (Addendum)
Subjective:  Jose Ware is a 82 y.o. year old very pleasant male patient who presents for/with See problem oriented charting ROS- no fever or chills. Still with fatigue. Still with bilateral edema- worse on the right side.  Prior redness/warmth in right leg largely resolved- different pattern now noticed.   Past Medical History-  Patient Active Problem List   Diagnosis Date Noted  . CAD S/P PCI     Priority: High  . Ischemic cardiomyopathy     Priority: High  . History of embolic stroke     Priority: High  . Primary hyperparathyroidism ([place]) 12/28/2013    Priority: High  . Fatigue 12/01/2013    Priority: High  . History of prostate cancer 12/14/2008    Priority: High  . Dyslipidemia 11/16/2015    Priority: Medium  . History of adenomatous polyp of colon 01/18/2015    Priority: Medium  . PSORIASIS 10/15/2007    Priority: Medium  . Essential hypertension 11/27/2006    Priority: Medium  . NSVT (nonsustained ventricular tachycardia) ([place]) 11/08/2015    Priority: Low  . History of stomach cancer 12/01/2013    Priority: Low  . Lower back pain 03/24/2013    Priority: Low  . Bradycardia 09/23/2012    Priority: Low  . Bruising 09/23/2012    Priority: Low  . B12 deficiency 09/17/2010    Priority: Low  . PERIPHERAL NEUROPATHY 10/18/2007    Priority: Low  . DEGENERATIVE JOINT DISEASE 11/27/2006    Priority: Low  . NEPHROLITHIASIS, HX OF 11/27/2006    Priority: Low    Medications- reviewed and updated Current Outpatient Medications  Medication Sig Dispense Refill  . acetaminophen (TYLENOL) 325 MG tablet Take 650 mg by mouth as needed.    Marland Kitchen amLODipine (NORVASC) 5 MG tablet Take 1 tablet (5 mg total) by mouth daily. 90 tablet 3  . aspirin EC 81 MG tablet Take 1 tablet (81 mg total) by mouth daily. 90 tablet 1  . atorvastatin (LIPITOR) 80 MG tablet TAKE ONE TABLET BY MOUTH ONCE DAILY AT  6  PM 90 tablet 3  . beta carotene w/minerals (OCUVITE) tablet Take 1 tablet by  mouth daily.    . carvedilol (COREG) 6.25 MG tablet TAKE ONE TABLET BY MOUTH TWICE DAILY WITH MEALS 180 tablet 3  . clopidogrel (PLAVIX) 75 MG tablet TAKE 1 TABLET BY MOUTH ONCE DAILY 30 tablet 6  . Cyanocobalamin (VITAMIN B-12) 5000 MCG TBDP Take 5,000 mcg by mouth daily.    Marland Kitchen doxycycline (VIBRA-TABS) 100 MG tablet Take 1 tablet (100 mg total) 2 (two) times daily for 14 days by mouth. 28 tablet 0  . losartan (COZAAR) 100 MG tablet Take 1 tablet (100 mg total) by mouth daily. 90 tablet 3  . nitroGLYCERIN (NITROSTAT) 0.4 MG SL tablet Place 1 tablet (0.4 mg total) under the tongue every 5 (five) minutes x 3 doses as needed for chest pain. 25 tablet 2  . pantoprazole (PROTONIX) 40 MG tablet Take 40 mg by mouth as needed.    Marland Kitchen spironolactone (ALDACTONE) 25 MG tablet TAKE ONE TABLET BY MOUTH ONCE DAILY 90 tablet 3  . triamcinolone ointment (KENALOG) 0.5 % Apply 1 application 2 (two) times daily topically. 7-10 days then need 10 day break before retrialing. 30 g 1   No current facility-administered medications for this visit.     Objective: BP (!) 150/80 (BP Location: Left Arm, Patient Position: Sitting, Cuff Size: Normal)   Pulse (!) 56   Temp 97.9  F (36.6 C) (Oral)   Ht 6' (1.829 m)   Wt 228 lb 8 oz (103.6 kg)   SpO2 98%   BMI 30.99 kg/m  Gen: NAD, resting comfortably No mucus membrane involvement CV: RRR no murmurs rubs or gallops Lungs: CTAB no crackles, wheeze, rhonchi Ext: trace to 1+ pretibial edema on left and1+ to 2+ on right Skin: prior confluent erythema, warmth noted up significant portion of leg particularly intense around psoriatic plaque at ankle- now much improved with minimal erythema or warmth- has new rash with multiple papules and several areas significant excoriation. This new area of rash is less tender than prior rash for most part. He also has much smaller but similar patch at base of left ankle.   Assessment/Plan:  Cellulitis- improving Drug rash- new S: Patient  has completed about a week of treatment with doxycycline for cellulitis starting 01/27/15. He states prior redness around rash and extending onto calf was improving. Then  3 days into doxycycline started with itchy red rash on back of legs- several red papules- thinks may scratch at night as several areas of excoriation. Still dealing with fatigue.  A/P: cellulitis appears much improved- tempted to change to keflex but given improvement will just stop doxycycline for now- reasoning being I am concerned about drug rash as cause of current rash. He has triamcinolone ointment from last visit which I encouraged him to trial on the rash for next 7 days. Close follow up Thursday 1 pm with sooner precautions discussed.  May very well start keflex at follow up visit.    Monitor BP BP Readings from Last 3 Encounters:  02/02/17 (!) 150/80  01/27/17 134/70  01/02/17 136/68  remained up on repeat today which is atypical for him- recheck next visit.   Future Appointments  Date Time Provider [place]  02/05/2017  1:00 PM [name], MD [facility] None  02/17/2017  4:20 PM Martinique, Peter M, MD [facility]  04/27/2017  8:45 AM Yong Channel Brayton Mars, MD [facility] None   Return precautions advised.  Garret Reddish, MD

## 2017-02-04 ENCOUNTER — Encounter: Payer: Self-pay | Admitting: Family Medicine

## 2017-02-05 ENCOUNTER — Ambulatory Visit (INDEPENDENT_AMBULATORY_CARE_PROVIDER_SITE_OTHER): Payer: Medicare Other | Admitting: Family Medicine

## 2017-02-05 ENCOUNTER — Ambulatory Visit: Payer: Medicare Other | Admitting: Family Medicine

## 2017-02-05 ENCOUNTER — Encounter: Payer: Self-pay | Admitting: Family Medicine

## 2017-02-05 VITALS — BP 136/74 | HR 54 | Temp 97.5°F | Ht 72.0 in | Wt 225.8 lb

## 2017-02-05 DIAGNOSIS — T50905A Adverse effect of unspecified drugs, medicaments and biological substances, initial encounter: Secondary | ICD-10-CM | POA: Insufficient documentation

## 2017-02-05 DIAGNOSIS — D692 Other nonthrombocytopenic purpura: Secondary | ICD-10-CM | POA: Insufficient documentation

## 2017-02-05 DIAGNOSIS — I10 Essential (primary) hypertension: Secondary | ICD-10-CM | POA: Diagnosis not present

## 2017-02-05 DIAGNOSIS — Z23 Encounter for immunization: Secondary | ICD-10-CM | POA: Diagnosis not present

## 2017-02-05 NOTE — Patient Instructions (Addendum)
Flu shot today  Continue cream on leg for 7 days, on ankle up to 10 days on right leg. Suspect swelling should improve over coming weeks- if not or worsens return to see [place]. Also let us know if you have calf pain or entire leg swelling- doubt this will happen. See [place] back if redness were to worsen again.   Blood pressure looks better today

## 2017-02-05 NOTE — Assessment & Plan Note (Signed)
S: With discontinuation of doxycycline, the rash on the back of patient's right leg and lower left leg have much improved.  He states he has complete resolution of the rash on the bottom of the left lower leg above the ankle and psoriatic plaque.  On the right leg, he no longer has excoriation.  Erythema is much improved as well as large decrease in the number of papules that he had.  The triamcinolone seems to be helping as well and the psoriatic plaque at each ankle is much improved.  Fortunately no sign of progressive cellulitis even after we took him off of antibiotic. A/P: Once again cellulitis appears to have resolved.  Drug reaction/rash has much improved off of the doxycycline.  Some of the improvement could be due to the steroid ointment as well.  I was tentative to list the doxycycline as an allergy but I do think it would be preferable to try other options first if able in the future.

## 2017-02-05 NOTE — Progress Notes (Signed)
Subjective:  Jose Ware is a 82 y.o. year old very pleasant male patient who presents for/with See problem oriented charting ROS-no fever, chills.  No chest pain or shortness of breath.  Prior fatigue he felt is much improved.  He has some fatigue in his legs with walking but denies pain.  Past Medical History-  Patient Active Problem List   Diagnosis Date Noted  . CAD S/P PCI     Priority: High  . Ischemic cardiomyopathy     Priority: High  . History of embolic stroke     Priority: High  . Primary hyperparathyroidism ([place]) 12/28/2013    Priority: High  . Fatigue 12/01/2013    Priority: High  . History of prostate cancer 12/14/2008    Priority: High  . Dyslipidemia 11/16/2015    Priority: Medium  . History of adenomatous polyp of colon 01/18/2015    Priority: Medium  . PSORIASIS 10/15/2007    Priority: Medium  . Essential hypertension 11/27/2006    Priority: Medium  . NSVT (nonsustained ventricular tachycardia) ([place]) 11/08/2015    Priority: Low  . History of stomach cancer 12/01/2013    Priority: Low  . Lower back pain 03/24/2013    Priority: Low  . Bradycardia 09/23/2012    Priority: Low  . Bruising 09/23/2012    Priority: Low  . B12 deficiency 09/17/2010    Priority: Low  . PERIPHERAL NEUROPATHY 10/18/2007    Priority: Low  . DEGENERATIVE JOINT DISEASE 11/27/2006    Priority: Low  . NEPHROLITHIASIS, HX OF 11/27/2006    Priority: Low  . Drug reaction 02/05/2017  . Senile purpura ([place]) 02/05/2017    Medications- reviewed and updated Current Outpatient Medications  Medication Sig Dispense Refill  . acetaminophen (TYLENOL) 325 MG tablet Take 650 mg by mouth as needed.    Marland Kitchen amLODipine (NORVASC) 5 MG tablet Take 1 tablet (5 mg total) by mouth daily. 90 tablet 3  . aspirin EC 81 MG tablet Take 1 tablet (81 mg total) by mouth daily. 90 tablet 1  . atorvastatin (LIPITOR) 80 MG tablet TAKE ONE TABLET BY MOUTH ONCE DAILY AT  6  PM 90 tablet 3  . beta carotene  w/minerals (OCUVITE) tablet Take 1 tablet by mouth daily.    . carvedilol (COREG) 6.25 MG tablet TAKE ONE TABLET BY MOUTH TWICE DAILY WITH MEALS 180 tablet 3  . clopidogrel (PLAVIX) 75 MG tablet TAKE 1 TABLET BY MOUTH ONCE DAILY 30 tablet 6  . Cyanocobalamin (VITAMIN B-12) 5000 MCG TBDP Take 5,000 mcg by mouth daily.    Marland Kitchen losartan (COZAAR) 100 MG tablet Take 1 tablet (100 mg total) by mouth daily. 90 tablet 3  . nitroGLYCERIN (NITROSTAT) 0.4 MG SL tablet Place 1 tablet (0.4 mg total) under the tongue every 5 (five) minutes x 3 doses as needed for chest pain. 25 tablet 2  . pantoprazole (PROTONIX) 40 MG tablet Take 40 mg by mouth as needed.    Marland Kitchen spironolactone (ALDACTONE) 25 MG tablet TAKE ONE TABLET BY MOUTH ONCE DAILY 90 tablet 3  . triamcinolone ointment (KENALOG) 0.5 % Apply 1 application 2 (two) times daily topically. 7-10 days then need 10 day break before retrialing. 30 g 1   No current facility-administered medications for this visit.     Objective: BP 136/74   Pulse (!) 54   Temp (!) 97.5 F (36.4 C) (Oral)   Ht 6' (1.829 m)   Wt 225 lb 12.8 oz (102.4 kg)   SpO2  95%   BMI 30.62 kg/m  Gen: NAD, resting comfortably CV: Bradycardic but regular.  No murmurs rubs or gallops Lungs: CTAB no crackles, wheeze, rhonchi Ext: 1+ edema on right leg.  Trace edema on left leg. Skin: Compared to last visit on right leg- note drastic decrease in number of papules and level of erythema.  Minimal excoriation.  plaques at the ankles are drastically improved.  In some areas the scaled area has even fallen off.  On left leg.  Area of papules on ankle now gone.  Psoriatic plaque improved in regards to size  Assessment/Plan:  Drug reaction S: With discontinuation of doxycycline, the rash on the back of patient's right leg and lower left leg have much improved.  He states he has complete resolution of the rash on the bottom of the left lower leg above the ankle and psoriatic plaque.  On the right leg,  he no longer has excoriation.  Erythema is much improved as well as large decrease in the number of papules that he had.  The triamcinolone seems to be helping as well and the psoriatic plaque at each ankle is much improved.  Fortunately no sign of progressive cellulitis even after we took him off of antibiotic. A/P: Once again cellulitis appears to have resolved.  Drug reaction/rash has much improved off of the doxycycline.  Some of the improvement could be due to the steroid ointment as well.  I was tentative to list the doxycycline as an allergy but I do think it would be preferable to try other options first if able in the future.  Essential hypertension S: Much improved control on losartan 100mg , amlodipine 5mg  today as compared to last visit. BP Readings from Last 3 Encounters:  02/05/17 136/74  02/02/17 (!) 150/80  01/27/17 134/70  A/P: We discussed blood pressure goal of <140/90. Continue current meds: Appears last visit was a one-time anomaly as hoped  Senile purpura [facility]) Patient states he has had issues with this since he was started on aspirin.  Very easy bruising and bleeding.  He has multiple bruises on his arms and hands.  Must continue aspirin given his heart disease.  I did feel it was reasonable for him to get his flu shot given his improvement.  Future Appointments  Date Time Provider [place]  02/17/2017  4:20 PM Martinique, Peter M, MD [facility]  04/27/2017  8:45 AM Yong Channel Brayton Mars, MD [facility]   Orders Placed This Encounter  Procedures  . Flu vaccine HIGH DOSE PF   Return precautions advised.  Garret Reddish, MD

## 2017-02-05 NOTE — Assessment & Plan Note (Signed)
S: Much improved control on losartan 100mg , amlodipine 5mg  today as compared to last visit. BP Readings from Last 3 Encounters:  02/05/17 136/74  02/02/17 (!) 150/80  01/27/17 134/70  A/P: We discussed blood pressure goal of <140/90. Continue current meds: Appears last visit was a one-time anomaly as hoped

## 2017-02-05 NOTE — Assessment & Plan Note (Signed)
Patient states he has had issues with this since he was started on aspirin.  Very easy bruising and bleeding.  He has multiple bruises on his arms and hands.  Must continue aspirin given his heart disease.

## 2017-02-17 ENCOUNTER — Ambulatory Visit: Payer: Medicare Other | Admitting: Cardiology

## 2017-02-21 ENCOUNTER — Encounter: Payer: Self-pay | Admitting: Cardiology

## 2017-02-24 ENCOUNTER — Telehealth: Payer: Self-pay | Admitting: Cardiology

## 2017-02-24 ENCOUNTER — Encounter: Payer: Self-pay | Admitting: Cardiology
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
## 2017-02-24 NOTE — Telephone Encounter (Signed)
Message sent to scheduling 

## 2017-03-10 DIAGNOSIS — I219 Acute myocardial infarction, unspecified: Secondary | ICD-10-CM

## 2017-03-10 HISTORY — DX: Acute myocardial infarction, unspecified: I21.9

## 2017-03-10 HISTORY — PX: COLONOSCOPY: SHX174

## 2017-04-02 DIAGNOSIS — Z961 Presence of intraocular lens: Secondary | ICD-10-CM | POA: Diagnosis not present

## 2017-04-02 DIAGNOSIS — H353132 Nonexudative age-related macular degeneration, bilateral, intermediate dry stage: Secondary | ICD-10-CM | POA: Diagnosis not present

## 2017-04-02 DIAGNOSIS — H35372 Puckering of macula, left eye: Secondary | ICD-10-CM | POA: Diagnosis not present

## 2017-04-07 ENCOUNTER — Ambulatory Visit: Payer: Medicare Other | Admitting: Cardiology

## 2017-04-07 ENCOUNTER — Encounter: Payer: Self-pay | Admitting: Cardiology

## 2017-04-07 VITALS — BP 134/80 | HR 56 | Ht 72.0 in | Wt 223.4 lb

## 2017-04-07 DIAGNOSIS — Z9861 Coronary angioplasty status: Secondary | ICD-10-CM

## 2017-04-07 DIAGNOSIS — E785 Hyperlipidemia, unspecified: Secondary | ICD-10-CM | POA: Diagnosis not present

## 2017-04-07 DIAGNOSIS — I255 Ischemic cardiomyopathy: Secondary | ICD-10-CM | POA: Diagnosis not present

## 2017-04-07 DIAGNOSIS — I10 Essential (primary) hypertension: Secondary | ICD-10-CM

## 2017-04-07 DIAGNOSIS — I2510 Atherosclerotic heart disease of native coronary artery without angina pectoris: Secondary | ICD-10-CM | POA: Diagnosis not present

## 2017-04-07 MED ORDER — NITROGLYCERIN 0.4 MG SL SUBL: 0.4000 mg | SUBLINGUAL_TABLET | SUBLINGUAL | 2 refills | Status: DC | PRN

## 2017-04-07 NOTE — Progress Notes (Signed)
04/07/2017 Jose Ware   16-Feb-1935  [id number]  Primary Physician Jose Channel Brayton Mars, MD Primary Cardiologist: Dr Jose Ware  HPI:  [name] is seen for follow up CAD. He has a history of HTN, remote tobacco abuse, psoriasis, prostate cancer s/p XRT and radiation seed therapy, adenomatous colon polyps and diverticulitis s/p previous abdominal surgeries. He presented to [facility] on 11/04/15 as a inferolateral STEMI. Emergent cath revealed a very large LCx with extensive thrombus. He underwent PCI with DES. With reperfusion he had NSVT. The pt also had residual RCA and LAD disease and an EF of 25% at cath. Echo done 11/05/15 showed an EF of 40-45%. His Troponin peaked at 25. On 11/06/15 he was taken back to the lab for staged PCI to his LAD and RCA. This was successful but on 11/07/15 the pt related that he developed some word finding issues that started post PCI 8/29. MRI revealed scattered small acute infarcts in the bilateral anterior and posterior circulation.  CA dopplers revealed mild plaque (1-39%). The pt's symptoms were improving and no further therapy was recommended.    On follow up today he reports he is doing very well. Denies any chest pain or SOB.  His wife (a retired Therapist, sports) notes he falls asleep easily during the day when he is sitting. He does not sleep well at night and has frequent nocturia. He is generally very active and can do yard work for hours. He does use a walking stick for balance.   Current Outpatient Medications  Medication Sig Dispense Refill  . acetaminophen (TYLENOL) 325 MG tablet Take 650 mg by mouth as needed.    Marland Kitchen amLODipine (NORVASC) 5 MG tablet Take 1 tablet (5 mg total) by mouth daily. 90 tablet 3  . aspirin EC 81 MG tablet Take 1 tablet (81 mg total) by mouth daily. 90 tablet 1  . atorvastatin (LIPITOR) 80 MG tablet TAKE ONE TABLET BY MOUTH ONCE DAILY AT  6  PM 90 tablet 3  . beta carotene w/minerals (OCUVITE) tablet Take 1 tablet by mouth daily.    .  carvedilol (COREG) 6.25 MG tablet TAKE ONE TABLET BY MOUTH TWICE DAILY WITH MEALS 180 tablet 3  . clopidogrel (PLAVIX) 75 MG tablet TAKE 1 TABLET BY MOUTH ONCE DAILY 30 tablet 6  . Cyanocobalamin (VITAMIN B-12) 5000 MCG TBDP Take 5,000 mcg by mouth daily.    Marland Kitchen losartan (COZAAR) 100 MG tablet Take 1 tablet (100 mg total) by mouth daily. 90 tablet 3  . nitroGLYCERIN (NITROSTAT) 0.4 MG SL tablet Place 1 tablet (0.4 mg total) under the tongue every 5 (five) minutes x 3 doses as needed for chest pain. 25 tablet 2  . pantoprazole (PROTONIX) 40 MG tablet Take 40 mg by mouth as needed.    Marland Kitchen spironolactone (ALDACTONE) 25 MG tablet TAKE ONE TABLET BY MOUTH ONCE DAILY 90 tablet 3  . triamcinolone ointment (KENALOG) 0.5 % Apply 1 application 2 (two) times daily topically. 7-10 days then need 10 day break before retrialing. 30 g 1   No current facility-administered medications for this visit.     Allergies  Allergen Reactions  . Bee Venom Swelling    Facial swelling  . Betadine [Povidone Iodine] Other (See Comments)    blistering  . Doxycycline     Possible drug rash- back of right lower leg and onto left lower leg as well    Social History   Socioeconomic History  . Marital status: Married  Spouse name: Not on file  . Number of children: 3  . Years of education: Not on file  . Highest education level: Not on file  Social Needs  . Financial resource strain: Not on file  . Food insecurity - worry: Not on file  . Food insecurity - inability: Not on file  . Transportation needs - medical: Not on file  . Transportation needs - non-medical: Not on file  Occupational History  . Not on file  Tobacco Use  . Smoking status: Former Smoker    Last attempt to quit: 03/11/1983    Years since quitting: 34.0  . Smokeless tobacco: Never Used  Substance and Sexual Activity  . Alcohol use: Yes    Alcohol/week: 0.0 oz    Comment: RARE  . Drug use: No  . Sexual activity: Not on file  Other Topics  Concern  . Not on file  Social History Narrative   [employer] [facility] [place], [place].   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  All other systems reviewed and are otherwise negative except as noted above.  Blood pressure 134/80, pulse (!) 56, height 6' (1.829 m), weight 223 lb 6 oz (101.3 kg).  GENERAL:  Well appearing WM in NAD HEENT:  PERRL, EOMI, sclera are clear. Oropharynx is clear. NECK:  No jugular venous distention, carotid upstroke brisk and symmetric, no bruits, no thyromegaly or adenopathy LUNGS:  Clear to auscultation bilaterally CHEST:  Unremarkable HEART:  RRR,  PMI not displaced or sustained,S1 and S2 within normal limits, no S3, no S4: no clicks, no rubs, no murmurs ABD:  Soft, nontender. BS +, no masses or bruits. No hepatomegaly, no splenomegaly EXT:  2 + pulses throughout, no edema, no cyanosis no clubbing SKIN:  Warm and dry.  No rashes NEURO:  Alert and oriented x 3. Cranial nerves II through XII intact. PSYCH:  Cognitively intact     Laboratory data:  Lab Results  Component Value Date   WBC 8.7 10/16/2016   HGB 12.6 (L) 10/16/2016   HCT 38.6 (L) 10/16/2016   PLT 247.0 10/16/2016   GLUCOSE 91 10/16/2016   GLUCOSE 91 10/16/2016   CHOL 66 02/20/2016   TRIG 47 02/20/2016   HDL 29 (L) 02/20/2016   LDLDIRECT 28.0 10/16/2016   LDLCALC 28 02/20/2016   ALT 11 10/16/2016   AST 13 10/16/2016   NA 139 10/16/2016   NA 139 10/16/2016   K 4.2 10/16/2016   K 4.2 10/16/2016   CL 105 10/16/2016   CL 105 10/16/2016   CREATININE 1.11 10/16/2016   CREATININE 1.11 10/16/2016   BUN 33 (H) 10/16/2016   BUN 33 (H)  10/16/2016   CO2 28 10/16/2016   CO2 28 10/16/2016   TSH 2.48 12/15/2014   PSA 0.01 (L) 12/15/2014   INR 1.08 11/04/2015   HGBA1C 5.3 11/04/2015     ASSESSMENT AND PLAN:  1. CAD s/p inferolateral STEMI in August 2017 with thrombotic occlusion of the LCx. S/p DES. Subsequent staged PCI of the  proximal LAD and RCA. Asymptomatic. Continue ASA.  2. Ischemic LV dysfunction. Asymptomatic. On Coreg. Losartan, aldactone. No overt CHF. 3. 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 Resolved. 4. Hypertension. Well controlled now.  5. Hyperlipidemia. On high dose statin.     PLAN   Follow up in 6 months.  Peter Martinique MD,FACC 04/07/2017 2:50 PM

## 2017-04-07 NOTE — Patient Instructions (Signed)
Continue your current therapy  I will see you in 6 months.   

## 2017-04-22 ENCOUNTER — Ambulatory Visit: Payer: Medicare Other | Admitting: Cardiology

## 2017-04-27 ENCOUNTER — Ambulatory Visit (INDEPENDENT_AMBULATORY_CARE_PROVIDER_SITE_OTHER): Payer: Medicare Other | Admitting: Family Medicine

## 2017-04-27 ENCOUNTER — Encounter: Payer: Self-pay | Admitting: Family Medicine

## 2017-04-27 VITALS — BP 124/60 | HR 61 | Temp 97.6°F | Ht 72.0 in | Wt 222.6 lb

## 2017-04-27 DIAGNOSIS — E210 Primary hyperparathyroidism: Secondary | ICD-10-CM | POA: Diagnosis not present

## 2017-04-27 DIAGNOSIS — G8929 Other chronic pain: Secondary | ICD-10-CM | POA: Diagnosis not present

## 2017-04-27 DIAGNOSIS — E785 Hyperlipidemia, unspecified: Secondary | ICD-10-CM

## 2017-04-27 DIAGNOSIS — I472 Ventricular tachycardia: Secondary | ICD-10-CM

## 2017-04-27 DIAGNOSIS — Z9861 Coronary angioplasty status: Secondary | ICD-10-CM

## 2017-04-27 DIAGNOSIS — I4729 Other ventricular tachycardia: Secondary | ICD-10-CM

## 2017-04-27 DIAGNOSIS — I2510 Atherosclerotic heart disease of native coronary artery without angina pectoris: Secondary | ICD-10-CM | POA: Diagnosis not present

## 2017-04-27 DIAGNOSIS — D692 Other nonthrombocytopenic purpura: Secondary | ICD-10-CM | POA: Diagnosis not present

## 2017-04-27 DIAGNOSIS — M545 Low back pain: Secondary | ICD-10-CM | POA: Diagnosis not present

## 2017-04-27 DIAGNOSIS — I10 Essential (primary) hypertension: Secondary | ICD-10-CM | POA: Diagnosis not present

## 2017-04-27 NOTE — Assessment & Plan Note (Signed)
S: Patient is compliant with plavix plus aspirin per cardiology and statin as well as coreg. Had Stemi in august 2017 and had staged PCI- has done well since that time and is asymptoamtic A/P: continue current medicine. Sees cardiology every 6 months for now

## 2017-04-27 NOTE — Assessment & Plan Note (Signed)
S: controlled on losartan 100mg , amlodipine 5mg , coreg 6.25mg  BID as well as spironolactone 25mg  BP Readings from Last 3 Encounters:  04/27/17 124/60  04/07/17 134/80  02/05/17 136/74  A/P: blood pressure goal of <140/90. Continue current meds. Working out at [employer] recently and BP looks even better

## 2017-04-27 NOTE — Assessment & Plan Note (Signed)
S/p parathyroidectomy for benign parathyroid adenoma. Fatigue has improved after surgery.  He feels like fatigue has worsened- we will update PTH
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
## 2017-04-27 NOTE — Patient Instructions (Addendum)
Schedule a visit with Dr. Paulla Fore before you leave- our sports medicine physician  Schedule a lab visit at the check out desk within 2 weeks- could also schedule an annual wellness visit with Cassie or Manuela Schwartz at the same time as that visit. Return for future fasting labs meaning nothing but water after midnight please. Ok to take your medications with water.

## 2017-04-27 NOTE — Assessment & Plan Note (Signed)
History NSVT after reperfusion from MI- unable to change this to history of nonsustained ventricular tachycardia per problem list limitations. No recurrence.

## 2017-04-27 NOTE — Assessment & Plan Note (Signed)
S: well controlled on lipitor 80mg . No myalgias.  Lab Results  Component Value Date   CHOL 66 02/20/2016   HDL 29 (L) 02/20/2016   LDLCALC 28 02/20/2016   LDLDIRECT 28.0 10/16/2016   TRIG 47 02/20/2016   CHOLHDL 2.3 02/20/2016   A/P: continue current medicines- will come back fasting for complete lipid panel
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
## 2017-04-27 NOTE — Assessment & Plan Note (Signed)
Again complains of easy bruising today on forearms and hands in particularly. We discussed plavix/aspirin certainly contributes

## 2017-04-30 ENCOUNTER — Encounter: Payer: Self-pay | Admitting: Sports Medicine

## 2017-04-30 ENCOUNTER — Ambulatory Visit: Payer: Medicare Other | Admitting: Sports Medicine

## 2017-04-30 ENCOUNTER — Ambulatory Visit (INDEPENDENT_AMBULATORY_CARE_PROVIDER_SITE_OTHER): Payer: Medicare Other

## 2017-04-30 VITALS — BP 130/66 | HR 50 | Ht 72.0 in | Wt 222.0 lb

## 2017-04-30 DIAGNOSIS — G8929 Other chronic pain: Secondary | ICD-10-CM

## 2017-04-30 DIAGNOSIS — M47816 Spondylosis without myelopathy or radiculopathy, lumbar region: Secondary | ICD-10-CM | POA: Diagnosis not present

## 2017-04-30 DIAGNOSIS — Z8546 Personal history of malignant neoplasm of prostate: Secondary | ICD-10-CM

## 2017-04-30 DIAGNOSIS — M48061 Spinal stenosis, lumbar region without neurogenic claudication: Secondary | ICD-10-CM | POA: Diagnosis not present

## 2017-04-30 DIAGNOSIS — R29898 Other symptoms and signs involving the musculoskeletal system: Secondary | ICD-10-CM

## 2017-04-30 DIAGNOSIS — Z85028 Personal history of other malignant neoplasm of stomach: Secondary | ICD-10-CM

## 2017-04-30 DIAGNOSIS — M545 Low back pain: Secondary | ICD-10-CM | POA: Diagnosis not present

## 2017-04-30 NOTE — Progress Notes (Signed)
Jose Ware. Jose Ware, [facility]  Jose Ware - 83 y.o. male MRN [id number]  Date of birth: 1935-02-15  Visit Date: 04/30/2017  PCP: [name], MD   Referred by: [name], MD   Scribe for today's visit: Jose Ware, CMA     SUBJECTIVE:  Jose Flavin "Gene Schill" is here for New Patient (Initial Visit) (Low back pain) .  Referred by: Dr. Garret Reddish His chronic low back pain symptoms INITIALLY: Began several years ago after falling from a roof into the snow.  Described as 8-9/10 sharp pain at it's worst after prolonged, nonradiating Worsened with prolonged periods of standing.  Notes that if he stands too long, then his knees lock and he has a hard time walking Improved with leaning, sitting Additional associated symptoms include: no N/T into B LEs, no increase in symptoms w/ coughing/sneezing    At this time symptoms are worsening compared to onset w/ increased pain He had been taking Excedrin and Aleve but stopped after being put on Plavix He has tried Voltaren with minimal relief. Dr. Yong Channel advised him to take Tylenol prn.    Hx of back surgery in 1961 X-ray L-spine 08/24/15.    ROS Denies night time disturbances. Denies fevers, chills, or night sweats. Denies unexplained weight loss. Reports personal history of cancer.  Prostate CA in 2008-2009.  Also had stomach and large intestine cancer. Denies changes in bowel or bladder habits. Reports recent unreported falls. 1.5 years ago when he tripped and fell. Denies new or worsening dyspnea or wheezing. Reports headaches or dizziness.  Dizziness after stroke. Denies numbness, tingling or weakness  In the extremities.  Reports dizziness or presyncopal episodes Reports lower extremity edema .  Intermittent swelling in lower legs    HISTORY & PERTINENT PRIOR DATA:  Prior History reviewed and updated per electronic medical  record.  Significant history, findings, studies and interim changes include:  reports that he quit smoking about 34 years ago. he has never used smokeless tobacco. No results for input(s): HGBA1C, LABURIC, CREATINE in the last 8760 hours. No specialty comments available. Problem  Spinal Stenosis of Lumbar Region At Multiple Levels   MRI lumbar spine 11/02/2015 shows significant multilevel degenerative changes with moderate right foraminal narrowing at L1 to L2.  Moderate disc degeneration at L4-5 with left foraminal narrowing and mild right foraminal narrowing    Lower Back Pain   "broke back" falling off roof into snowbank. Off and on.      OBJECTIVE:  VS:  HT:6' (182.9 cm)   WT:222 lb (100.7 kg)  BMI:30.1    BP:130/66  HR:(!) 50bpm  TEMP: ( )  RESP:96 %   PHYSICAL EXAM: Constitutional: WDWN, Non-toxic appearing. Psychiatric: Alert & appropriately interactive.  Not depressed or anxious appearing. Respiratory: No increased work of breathing.  Trachea Midline Eyes: Pupils are equal.  EOM intact without nystagmus.  No scleral icterus  NEUROVASCULAR exam: No clubbing or cyanosis appreciated No significant venous stasis changes Capillary Refill: normal, less than 2 seconds   No significant pain with straight leg raises.  Most focal pain in his back is just left of midline over L1-L2 facet.  He has pain with lumbar hyperextension.  He walks with an antalgic slightly hunched forward gait and uses a walking stick to help with balance.  He has weakness in the left hip flexor at 3 out of 5 he reports is been chronic.  Right hip is 4 out of 5.  Knee extension knee flexion and ankle dorsiflexion ankle plantarflexion strength is all intact and symmetric bilaterally.  Lower extremity sensation is otherwise intact light touch in the lower extremity DTRs are trace diffusely.  No additional findings.   ASSESSMENT & PLAN:   1. Chronic bilateral low back pain without sciatica   2. Spinal  stenosis of lumbar region at multiple levels   3. History of stomach cancer   4. History of prostate cancer   5. Weakness of left hip    PLAN:    Spinal stenosis of lumbar region at multiple levels MRI lumbar spine 11/02/2015 shows significant multilevel degenerative changes with moderate right foraminal narrowing at L1 to L2.  Moderate disc degeneration at L4-5 with left foraminal narrowing and mild right foraminal narrowing.  Symptoms are quite unique given that he has some improvement with ambulation but actually has worsening symptoms with standing.  The symptoms have been chronic and ongoing quite some time and his prior MRI revealed significant multilevel changes.  He does have a history of stomach cancer but has been well controlled.  Additionally he is on Plavix and will need to come off of this prior to an epidural this will need to be coordinated with cardiology.  Referral to Dr. Ernestina Patches for epidural steroid injection localizing to the L1-L2 given believe this is the most significant area format where he does localize majority of his pain is.  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  If any lack of improvement or short-term relief only further diagnostic evaluation with repeat MRI will be considered but I will check in with him in 8 weeks and check on his progress at that time.   ++++++++++++++++++++++++++++++++++++++++++++ Orders & Meds: Orders Placed This Encounter  Procedures  . DG Lumbar Spine 2-3 Views  . Ambulatory referral to Physical Medicine Rehab    No orders of the defined types were placed in this encounter.   ++++++++++++++++++++++++++++++++++++++++++++ Follow-up: Return in about 8 weeks (around 06/25/2017).   Pertinent documentation may be included in additional procedure notes, imaging studies, problem based documentation and patient instructions. Please see  these sections of the encounter for additional information regarding this visit. CMA/ATC served as Education administrator during this visit. History, Physical, and Plan performed by medical provider. Documentation and orders reviewed and attested to.      Gerda Diss, [facility] Physician

## 2017-04-30 NOTE — Assessment & Plan Note (Addendum)
MRI lumbar spine 11/02/2015 shows significant multilevel degenerative changes with moderate right foraminal narrowing at L1 to L2.  Moderate disc degeneration at L4-5 with left foraminal narrowing and mild right foraminal narrowing.  Symptoms are quite unique given that he has some improvement with ambulation but actually has worsening symptoms with standing.  The symptoms have been chronic and ongoing quite some time and his prior MRI revealed significant multilevel changes.  He does have a history of stomach cancer but has been well controlled.  Additionally he is on Plavix and will need to come off of this prior to an epidural this will need to be coordinated with cardiology.  Referral to Dr. Ernestina Patches for epidural steroid injection localizing to the L1-L2 given believe this is the most significant area format where he does localize majority of his pain is.  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  If any lack of improvement or short-term relief only further diagnostic evaluation with repeat MRI will be considered but I will check in with him in 8 weeks and check on his progress at that time.

## 2017-05-01 ENCOUNTER — Telehealth (INDEPENDENT_AMBULATORY_CARE_PROVIDER_SITE_OTHER): Payer: Self-pay | Admitting: *Deleted

## 2017-05-01 NOTE — Telephone Encounter (Signed)
Yes may hold plavix for 7 days for epidural.  Rianna Lukes Martinique MD, [facility]

## 2017-05-06 NOTE — Progress Notes (Signed)
Subjective:   Jose Ware is a 83 y.o. male who presents for Medicare Annual/Subsequent preventive examination.   Cardiac Risk Factors include: advanced age (>76men, >60 women);dyslipidemia;family history of premature cardiovascular disease;hypertension;male genderReports health as Last OV 04/27/2017  CVA after health cath  2 mini strokes; speech has come back Balance is off and on Feels frustrated due to having to rest   Wife was a nurse and went back to school after retiring  3 brothers and 3 sister One sister left in [place]   Children 3 - wife has 68 and one dtr died Children in [place] and [place] 30 Chol/hdl; 2.3; hdl 29; trig 47 Diabetes neg BMI 28  Appetite is good Breakfast; get up and has cereal or bagel with peanut butter; sometimes eggs Lunch; most sandwich of cup of soup Supper wife cooks    Exercise In cardiac rehab last year  [facility], sometimes 5 Rides the stationary bike and does weights    There are no preventive care reminders to display for this patient.  PSA 12/2014 wnl New urologist;  Colonoscopy 08/2015 postponed 08/2017  Educated regarding shingrix   Former smoker  Had CT abd and pelvis 10/2015 - reported  Normal caliber abdominal aorta.      Objective:    Vitals: BP 116/60   Ht 6\' 1"  (1.854 m)   Wt 219 lb 8 oz (99.6 kg)   BMI 28.96 kg/m   Body mass index is 28.96 kg/m.  Advanced Directives 05/07/2017 11/04/2015 11/04/2015 08/28/2015 08/07/2015 01/01/2015  Does Patient Have a Medical Advance Directive? Yes No No Yes No;Yes Yes  Type of Advance Directive - - - Healthcare Power of [place]  Would patient like information on creating a medical advance directive? - No - patient declined information - - - -    Tobacco Social History   Tobacco Use  Smoking Status Former Smoker  . Packs/day: 2.00  . Years: 37.00  . Pack years: 74.00  . Last attempt to  quit: 03/11/1983  . Years since quitting: 34.1  Smokeless Tobacco Never Used  Tobacco Comment   started smoking in the service; ICU 52' part of his stomach; appendix; coma      Counseling given: Yes Comment: started smoking in the service; ICU 85' part of his stomach; appendix; coma    Clinical Intake:     Past Medical History:  Diagnosis Date  . Adenocarcinoma of prostate [facility])    XRT & Radiation seed implantation in 2010  . Adenomatous colon polyp   . CAD (coronary artery disease)    a. 11/04/15:  Acute inferolateral STEMI: S/p emergent DES of a very large LCx with extensive thrombus. Significant residual disease in the proximal LAD and distal RCA. S/p staged PCI of LAD and RCA 8/29.  Marland Kitchen CVA (cerebral infarction)    a. 11/9369: embolic CVA after heart cath   . CVA (cerebral infarction)    a. 08/9676: embolic CVA after heart cath   . DIVERTICULITIS, HX OF 11/27/2006       . DJD (degenerative joint disease)    wrist - R   . Hernia   . History of blood transfusion 1985   with colon surgery  . Hypertension   . Ischemic cardiomyopathy    a. EF 25-30% by LV gram on cath 11/04/15. Appeared out of proportion to infarct although infarct was large.  EF improved by Echo and now 40-45%.  . NEPHROLITHIASIS, HX OF 11/27/2006        . Peripheral neuropathy   . Psoriasis   . Tenosynovitis    Past Surgical History:  Procedure Laterality Date  . APPENDECTOMY  1985  . BACK SURGERY  1980   minor disk surgery: instrumentation placed and removed in a second procedure  . CARDIAC CATHETERIZATION N/A 11/04/2015   Procedure: Left Heart Cath and Coronary Angiography;  Surgeon: Peter M Martinique, MD;  Location: [place] [facility];  Service: Cardiovascular;  Laterality: N/A;  . CARDIAC CATHETERIZATION N/A 11/04/2015   Procedure: Coronary Stent Intervention;  Surgeon: Peter M Martinique, MD;  Location: [place] CV [facility];  Service: Cardiovascular;  Laterality: N/A;  . CARDIAC CATHETERIZATION N/A 11/06/2015    Procedure: Coronary Stent Intervention;  Surgeon: Leonie Man, MD;  Location: [place] [facility];  Service: Cardiovascular;  Laterality: N/A;  . CATARACT EXTRACTION, BILATERAL  2013  . CHOLECYSTECTOMY  1986  . COLON SURGERY  1980's   pt. had ileus, had colon surgery, requiring colostomy & then reversal & then dehisence of that wound & return to OR for repair & cholecystectomy    . COLONOSCOPY    . COLOSTOMY  1985   After colectomy for diverticulitis, pt. remarks during this surgery they "gave me the paddles two times  because of bleeding", pt. states it was unrelated to any anesthesia complication  . EYE SURGERY     /w IOL  . KNEE ARTHROSCOPY Left 2003  . KNEE SURGERY  1956   Left 1956, Right w/cartilage removed later  . PARATHYROIDECTOMY N/A 05/12/2014   Procedure: PARATHYROIDECTOMY;  Surgeon: Armandina Gemma, MD;  Location: [place];  Service: General;  Laterality: N/A;  . REVERSAL OF COLOSTOMY  1987  . TONSILLECTOMY  1946  . WRIST SURGERY Left    Remote complicated w/infection   Family History  Problem Relation Age of Onset  . Coronary artery disease Mother   . Alcohol abuse Father   . Cirrhosis Father   . Heart failure Sister   . Breast cancer Sister        W/involvement of right arm leading to amputation  . Coronary artery disease Brother   . Heart attack Brother   . Clotting disorder Brother   . Prostate cancer Neg Hx   . Colon cancer Neg Hx   . Diabetes Neg Hx   . Glaucoma Neg Hx   . Rectal cancer Neg Hx   . Esophageal cancer Neg Hx    Social History   Socioeconomic History  . Marital status: Married    Spouse name: Not on file  . Number of children: 3  . Years of education: Not on file  . Highest education level: Not on file  Social Needs  . Financial resource strain: Not on file  . Food insecurity - worry: Not on file  . Food insecurity - inability: Not on file  . Transportation needs - medical: Not on file  . Transportation needs - non-medical: Not on file    Occupational History  . Not on file  Tobacco Use  . Smoking status: Former Smoker    Packs/day: 2.00    Years: 37.00    Pack years: 74.00    Last attempt to quit: 03/11/1983    Years since quitting: 34.1  . Smokeless tobacco: Never Used  . Tobacco comment: started smoking in the service; ICU 85' part of his stomach; appendix; coma  Substance and Sexual Activity  . Alcohol use: Yes    Alcohol/week: 0.0 oz    Comment: RARE  . Drug use: No  . Sexual activity: Not on file  Other Topics Concern  . Not on file  Social History Narrative   [employer] [place], [place].   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 acetaminophen (TYLENOL) 325 MG tablet Take 650 mg by mouth as needed.  Marland Kitchen amLODipine (NORVASC) 5 MG tablet Take 1 tablet (5 mg total) by mouth daily.  Marland Kitchen aspirin EC 81 MG tablet Take 1 tablet (81 mg total) by mouth daily.  Marland Kitchen atorvastatin (LIPITOR) 80 MG tablet TAKE ONE TABLET BY MOUTH ONCE DAILY AT  6  PM  . beta carotene w/minerals (OCUVITE) tablet Take 1 tablet by mouth daily.  . carvedilol (COREG) 6.25 MG tablet TAKE ONE TABLET BY MOUTH TWICE DAILY WITH MEALS  . clopidogrel (PLAVIX) 75 MG tablet TAKE 1 TABLET BY MOUTH ONCE DAILY  . Cyanocobalamin (VITAMIN B-12) 5000 MCG TBDP Take 5,000 mcg by mouth daily.  Marland Kitchen losartan (COZAAR) 100 MG tablet Take 1 tablet (100 mg total) by mouth daily.  . nitroGLYCERIN (NITROSTAT) 0.4 MG SL tablet Place 1 tablet (0.4 mg total) under the tongue every 5 (five) minutes x 3 doses as needed for chest pain.  . pantoprazole (PROTONIX) 40 MG tablet Take 40 mg by mouth as  needed.  Marland Kitchen spironolactone (ALDACTONE) 25 MG tablet TAKE ONE TABLET BY MOUTH ONCE DAILY   No facility-administered encounter medications on file as of 05/07/2017.     Activities of Daily Living In your present state of health, do you have any difficulty performing the following activities: 05/07/2017  Hearing? N  Vision? N  Difficulty concentrating or making decisions? N  Walking or climbing stairs? Y  Comment avoids if he can  Dressing or bathing? N  Doing errands, shopping? N  Preparing Food and eating ? N  Using the Toilet? N  In the past six months, have you accidently leaked urine? N  Do you have problems with loss of bowel control? N  Managing your Medications? N  Managing your Finances? N  Housekeeping or managing your Housekeeping? N  Some recent data might be hidden    Patient Care Team: [name], MD as PCP - General (Family Medicine) Lindwood Coke, MD (Dermatology) Lowella Bandy, MD (Urology) Ladene Artist, MD (Gastroenterology)   Assessment:   This is a routine wellness examination for Jose Ware.  Exercise Activities and Dietary recommendations Current Exercise Habits: Structured exercise class, Type of exercise: Other - see comments(3 times a week and sometimes x 5), Intensity: Moderate  Goals    . Patient Stated     Continue with your exercise        Fall Risk Fall Risk  05/07/2017 02/05/2017 12/13/2015 02/27/2014  Falls in the past year? No No No No  Risk for fall due to : - - - History of fall(s)   Bathroom; is not really handicapped  accessible But does have a bar in shower Higher toilets Plans to stay in home Already downsized from 2 story to one level home   Depression Screen PHQ 2/9 Scores 05/07/2017 02/05/2017 03/14/2016 02/27/2014  PHQ - 2 Score 0 0 0 0    Cognitive Function MMSE - Mini Mental State Exam 05/07/2017  Not completed: (No Data)   Ad8 score reviewed for issues:  Issues making decisions:  Less interest in hobbies /  activities:  Repeats questions, stories (family complaining):  Trouble using ordinary gadgets (microwave, computer, phone):  Forgets the month or year:   Mismanaging finances:   Remembering appts:  Daily problems with thinking and/or memory: Ad8 score is=0 Not really having any issues Trying to remember names        Immunization History  Administered Date(s) Administered  . Influenza Split 01/06/2012  . Influenza Whole 12/19/2008, 12/27/2009  . Influenza, High Dose Seasonal PF 12/21/2012, 12/22/2014, 02/05/2017  . Influenza,inj,Quad PF,6+ Mos 12/01/2013, 10/24/2015  . Pneumococcal Conjugate-13 09/14/2015  . Pneumococcal Polysaccharide-23 02/12/2009  . Td 03/12/2003  . Tdap 02/27/2014    Screening Tests Health Maintenance  Topic Date Due  . COLONOSCOPY  08/29/2017 (Originally 08/29/2016)  . TETANUS/TDAP  02/28/2024  . INFLUENZA VACCINE  Completed  . PNA vac Low Risk Adult  Completed    Plan:      PCP Notes   Health Maintenance Discussed the shingrix ( has deferred due to expense; insurance 160.00) Does not wish to go to the [place] at this time for screening Plans to repeat colonoscopy this year in June    Abnormal Screens  no  Referrals  none  Patient concerns; Continues to work on his balance through exercise   Nurse Concerns; As noted; Very nice gentleman; still engaged in the community To start wood carving again.   Next PCP apt 10/05/2017      I have personally reviewed and noted the following in the patient's chart:   . Medical and social history . Use of alcohol, tobacco or illicit drugs  . Current medications and supplements . Functional ability and status . Nutritional status . Physical activity . Advanced directives . List of other physicians . Hospitalizations, surgeries, and ER visits in previous 12 months . Vitals . Screenings to include cognitive, depression, and falls . Referrals and appointments  In addition, I have  reviewed and discussed with patient certain preventive protocols, quality metrics, and best practice recommendations. A written personalized care plan for preventive services as well as general preventive health recommendations were provided to patient.     Wynetta Fines, RN  05/07/2017

## 2017-05-07 ENCOUNTER — Ambulatory Visit (INDEPENDENT_AMBULATORY_CARE_PROVIDER_SITE_OTHER): Payer: Medicare Other | Admitting: *Deleted

## 2017-05-07 ENCOUNTER — Other Ambulatory Visit (INDEPENDENT_AMBULATORY_CARE_PROVIDER_SITE_OTHER): Payer: Medicare Other

## 2017-05-07 ENCOUNTER — Encounter: Payer: Self-pay | Admitting: *Deleted

## 2017-05-07 VITALS — BP 116/60 | Ht 73.0 in | Wt 219.5 lb

## 2017-05-07 DIAGNOSIS — Z0000 Encounter for general adult medical examination without abnormal findings: Secondary | ICD-10-CM | POA: Diagnosis not present

## 2017-05-07 DIAGNOSIS — E210 Primary hyperparathyroidism: Secondary | ICD-10-CM

## 2017-05-07 DIAGNOSIS — E785 Hyperlipidemia, unspecified: Secondary | ICD-10-CM

## 2017-05-07 LAB — COMPREHENSIVE METABOLIC PANEL
ALBUMIN: 3.6 g/dL (ref 3.5–5.2)
ALT: 11 U/L (ref 0–53)
AST: 13 U/L (ref 0–37)
Alkaline Phosphatase: 73 U/L (ref 39–117)
BUN: 26 mg/dL — ABNORMAL HIGH (ref 6–23)
CHLORIDE: 101 meq/L (ref 96–112)
CO2: 32 mEq/L (ref 19–32)
CREATININE: 1.06 mg/dL (ref 0.40–1.50)
Calcium: 9.6 mg/dL (ref 8.4–10.5)
GFR: 71.28 mL/min (ref >60.00)
GLUCOSE: 82 mg/dL (ref 70–99)
Potassium: 4.2 mEq/L (ref 3.5–5.1)
SODIUM: 139 meq/L (ref 135–145)
TOTAL PROTEIN: 7 g/dL (ref 6.0–8.3)
Total Bilirubin: 1.4 mg/dL — ABNORMAL HIGH (ref 0.2–1.2)

## 2017-05-07 LAB — LIPID PANEL
CHOLESTEROL: 67 mg/dL (ref 0–200)
HDL: 29.1 mg/dL — ABNORMAL LOW (ref >39.00)
LDL Cholesterol: 26 mg/dL (ref 0–99)
NONHDL: 37.97
Total CHOL/HDL Ratio: 2
Triglycerides: 59 mg/dL (ref 0.0–149.0)
VLDL: 11.8 mg/dL (ref 0.0–40.0)

## 2017-05-07 LAB — CBC
HEMATOCRIT: 39.3 % (ref 39.0–52.0)
Hemoglobin: 13.2 g/dL (ref 13.0–17.0)
MCHC: 33.7 g/dL (ref 30.0–36.0)
MCV: 97.1 fl (ref 78.0–100.0)
Platelets: 266 10*3/uL (ref 150.0–400.0)
RBC: 4.04 Mil/uL — ABNORMAL LOW (ref 4.22–5.81)
RDW: 14.9 % (ref 11.5–15.5)
WBC: 9.6 10*3/uL (ref 4.0–10.5)

## 2017-05-07 NOTE — Patient Instructions (Addendum)
Jose Ware , Thank you for taking time to come for your Medicare Wellness Visit. I appreciate your ongoing commitment to your health goals. Please review the following plan we discussed and let me know if I can assist you in the future.   Shingrix is a vaccine for the prevention of Shingles in Adults 50 and older.  If you are on Medicare, you can request a prescription from your doctor to be filled at a pharmacy.  Please check with your benefits regarding applicable copays or out of pocket expenses.  The Shingrix is given in 2 vaccines approx 8 weeks apart. You must receive the 2nd dose prior to 6 months from receipt of the first.    These are the goals we discussed: Goals    . Patient Stated     Continue with your exercise        This is a list of the screening recommended for you and due dates:  Health Maintenance  Topic Date Due  . Colon Cancer Screening  08/29/2017*  . Tetanus Vaccine  02/28/2024  . Flu Shot  Completed  . Pneumonia vaccines  Completed  *Topic was postponed. The date shown is not the original due date.      Fall Prevention in the Home Falls can cause injuries. They can happen to people of all ages. There are many things you can do to make your home safe and to help prevent falls. What can I do on the outside of my home?  Regularly fix the edges of walkways and driveways and fix any cracks.  Remove anything that might make you trip as you walk through a door, such as a raised step or threshold.  Trim any bushes or trees on the path to your home.  Use bright outdoor lighting.  Clear any walking paths of anything that might make someone trip, such as rocks or tools.  Regularly check to see if handrails are loose or broken. Make sure that both sides of any steps have handrails.  Any raised decks and porches should have guardrails on the edges.  Have any leaves, snow, or ice cleared regularly.  Use sand or salt on walking paths during  winter.  Clean up any spills in your garage right away. This includes oil or grease spills. What can I do in the bathroom?  Use night lights.  Install grab bars by the toilet and in the tub and shower. Do not use towel bars as grab bars.  Use non-skid mats or decals in the tub or shower.  If you need to sit down in the shower, use a plastic, non-slip stool.  Keep the floor dry. Clean up any water that spills on the floor as soon as it happens.  Remove soap buildup in the tub or shower regularly.  Attach bath mats securely with double-sided non-slip rug tape.  Do not have throw rugs and other things on the floor that can make you trip. What can I do in the bedroom?  Use night lights.  Make sure that you have a light by your bed that is easy to reach.  Do not use any sheets or blankets that are too big for your bed. They should not hang down onto the floor.  Have a firm chair that has side arms. You can use this for support while you get dressed.  Do not have throw rugs and other things on the floor that can make you trip. What can I do in  the kitchen?  Clean up any spills right away.  Avoid walking on wet floors.  Keep items that you use a lot in easy-to-reach places.  If you need to reach something above you, use a strong step stool that has a grab bar.  Keep electrical cords out of the way.  Do not use floor polish or wax that makes floors slippery. If you must use wax, use non-skid floor wax.  Do not have throw rugs and other things on the floor that can make you trip. What can I do with my stairs?  Do not leave any items on the stairs.  Make sure that there are handrails on both sides of the stairs and use them. Fix handrails that are broken or loose. Make sure that handrails are as long as the stairways.  Check any carpeting to make sure that it is firmly attached to the stairs. Fix any carpet that is loose or worn.  Avoid having throw rugs at the top or  bottom of the stairs. If you do have throw rugs, attach them to the floor with carpet tape.  Make sure that you have a light switch at the top of the stairs and the bottom of the stairs. If you do not have them, ask someone to add them for you. What else can I do to help prevent falls?  Wear shoes that: ? Do not have high heels. ? Have rubber bottoms. ? Are comfortable and fit you well. ? Are closed at the toe. Do not wear sandals.  If you use a stepladder: ? Make sure that it is fully opened. Do not climb a closed stepladder. ? Make sure that both sides of the stepladder are locked into place. ? Ask someone to hold it for you, if possible.  Clearly mark and make sure that you can see: ? Any grab bars or handrails. ? First and last steps. ? Where the edge of each step is.  Use tools that help you move around (mobility aids) if they are needed. These include: ? Canes. ? Walkers. ? Scooters. ? Crutches.  Turn on the lights when you go into a dark area. Replace any light bulbs as soon as they burn out.  Set up your furniture so you have a clear path. Avoid moving your furniture around.  If any of your floors are uneven, fix them.  If there are any pets around you, be aware of where they are.  Review your medicines with your doctor. Some medicines can make you feel dizzy. This can increase your chance of falling. Ask your doctor what other things that you can do to help prevent falls. This information is not intended to replace advice given to you by your health care provider. Make sure you discuss any questions you have with your health care provider. Document Released: 12/21/2008 Document Revised: 08/02/2015 Document Reviewed: 03/31/2014 Elsevier Interactive Patient Education  2018 [employer] Maintenance, Male A healthy lifestyle and preventive care is important for your health and wellness. Ask your health care provider about what schedule of regular  examinations is right for you. What should I know about weight and diet? Eat a Healthy Diet  Eat plenty of vegetables, fruits, whole grains, low-fat dairy products, and lean protein.  Do not eat a lot of foods high in solid fats, added sugars, or salt.  Maintain a Healthy Weight Regular exercise can help you achieve or maintain a healthy weight. You should:  Do  at least 150 minutes of exercise each week. The exercise should increase your heart rate and make you sweat (moderate-intensity exercise).  Do strength-training exercises at least twice a week.  Watch Your Levels of Cholesterol and Blood Lipids  Have your blood tested for lipids and cholesterol every 5 years starting at 81 years of age. If you are at high risk for heart disease, you should start having your blood tested when you are 81 years old. You may need to have your cholesterol levels checked more often if: ? Your lipid or cholesterol levels are high. ? You are older than 81 years of age. ? You are at high risk for heart disease.  What should I know about cancer screening? Many types of cancers can be detected early and may often be prevented. Lung Cancer  You should be screened every year for lung cancer if: ? You are a current smoker who has smoked for at least 30 years. ? You are a former smoker who has quit within the past 15 years.  Talk to your health care provider about your screening options, when you should start screening, and how often you should be screened.  Colorectal Cancer  Routine colorectal cancer screening usually begins at 81 years of age and should be repeated every 5-10 years until you are 81 years old. You may need to be screened more often if early forms of precancerous polyps or small growths are found. Your health care provider may recommend screening at an earlier age if you have risk factors for colon cancer.  Your health care provider may recommend using home test kits to check for hidden  blood in the stool.  A small camera at the end of a tube can be used to examine your colon (sigmoidoscopy or colonoscopy). This checks for the earliest forms of colorectal cancer.  Prostate and Testicular Cancer  Depending on your age and overall health, your health care provider may do certain tests to screen for prostate and testicular cancer.  Talk to your health care provider about any symptoms or concerns you have about testicular or prostate cancer.  Skin Cancer  Check your skin from head to toe regularly.  Tell your health care provider about any new moles or changes in moles, especially if: ? There is a change in a mole's size, shape, or color. ? You have a mole that is larger than a pencil eraser.  Always use sunscreen. Apply sunscreen liberally and repeat throughout the day.  Protect yourself by wearing long sleeves, pants, a wide-brimmed hat, and sunglasses when outside.  What should I know about heart disease, diabetes, and high blood pressure?  If you are 41-75 years of age, have your blood pressure checked every 3-5 years. If you are 67 years of age or older, have your blood pressure checked every year. You should have your blood pressure measured twice-once when you are at a hospital or clinic, and once when you are not at a hospital or clinic. Record the average of the two measurements. To check your blood pressure when you are not at a hospital or clinic, you can use: ? An automated blood pressure machine at a pharmacy. ? A home blood pressure monitor.  Talk to your health care provider about your target blood pressure.  If you are between 41-32 years old, ask your health care provider if you should take aspirin to prevent heart disease.  Have regular diabetes screenings by checking your fasting blood sugar level. ?  If you are at a normal weight and have a low risk for diabetes, have this test once every three years after the age of 57. ? If you are overweight and  have a high risk for diabetes, consider being tested at a younger age or more often.  A one-time screening for abdominal aortic aneurysm (AAA) by ultrasound is recommended for men aged 40-75 years who are current or former smokers. What should I know about preventing infection? Hepatitis B If you have a higher risk for hepatitis B, you should be screened for this virus. Talk with your health care provider to find out if you are at risk for hepatitis B infection. Hepatitis C Blood testing is recommended for:  Everyone born from [birth year] through 1963.  Anyone with known risk factors for hepatitis C.  Sexually Transmitted Diseases (STDs)  You should be screened each year for STDs including gonorrhea and chlamydia if: ? You are sexually active and are younger than 81 years of age. ? You are older than 81 years of age and your health care provider tells you that you are at risk for this type of infection. ? Your sexual activity has changed since you were last screened and you are at an increased risk for chlamydia or gonorrhea. Ask your health care provider if you are at risk.  Talk with your health care provider about whether you are at high risk of being infected with HIV. Your health care provider may recommend a prescription medicine to help prevent HIV infection.  What else can I do?  Schedule regular health, dental, and eye exams.  Stay current with your vaccines (immunizations).  Do not use any tobacco products, such as cigarettes, chewing tobacco, and e-cigarettes. If you need help quitting, ask your health care provider.  Limit alcohol intake to no more than 2 drinks per day. One drink equals 12 ounces of beer, 5 ounces of wine, or 1 ounces of hard liquor.  Do not use street drugs.  Do not share needles.  Ask your health care provider for help if you need support or information about quitting drugs.  Tell your health care provider if you often feel depressed.  Tell your  health care provider if you have ever been abused or do not feel safe at home. This information is not intended to replace advice given to you by your health care provider. Make sure you discuss any questions you have with your health care provider. Document Released: 08/23/2007 Document Revised: 10/24/2015 Document Reviewed: 11/28/2014 Elsevier Interactive Patient Education  [employer].

## 2017-05-07 NOTE — Progress Notes (Signed)
I have personally reviewed the Medicare Annual Wellness Visit and agree with the assessment and plan.  Algis Greenhouse. Jerline Pain, MD 05/07/2017 10:09 AM

## 2017-05-08 LAB — PTH, INTACT AND CALCIUM
Calcium: 9.1 mg/dL (ref 8.6–10.3)
PTH: 32 pg/mL (ref 14–64)

## 2017-05-09 ENCOUNTER — Encounter: Payer: Self-pay | Admitting: Sports Medicine

## 2017-05-12 ENCOUNTER — Other Ambulatory Visit: Payer: Self-pay

## 2017-05-12 MED ORDER — ATORVASTATIN CALCIUM 40 MG PO TABS: 40.0000 mg | ORAL_TABLET | Freq: Every day | ORAL | 3 refills | Status: DC

## 2017-05-18 ENCOUNTER — Ambulatory Visit (INDEPENDENT_AMBULATORY_CARE_PROVIDER_SITE_OTHER): Payer: Medicare Other | Admitting: Physical Medicine and Rehabilitation

## 2017-05-18 ENCOUNTER — Ambulatory Visit (INDEPENDENT_AMBULATORY_CARE_PROVIDER_SITE_OTHER): Payer: Medicare Other

## 2017-05-18 VITALS — BP 122/62 | HR 50 | Temp 98.3°F

## 2017-05-18 DIAGNOSIS — M5415 Radiculopathy, thoracolumbar region: Secondary | ICD-10-CM

## 2017-05-18 DIAGNOSIS — M47816 Spondylosis without myelopathy or radiculopathy, lumbar region: Secondary | ICD-10-CM

## 2017-05-18 MED ORDER — METHYLPREDNISOLONE ACETATE 80 MG/ML IJ SUSP
80.0000 mg | Freq: Once | INTRAMUSCULAR | Status: AC
  Administered 2017-05-18: 80 mg

## 2017-05-18 NOTE — Progress Notes (Signed)
.  Numeric Pain Rating Scale and Functional Assessment Average Pain 8   In the last MONTH (on 0-10 scale) has pain interfered with the following?  1. General activity like being  able to carry out your everyday physical activities such as walking, climbing stairs, carrying groceries, or moving a chair?  Rating(7)   +Driver, -BT(stopped plavix 05/10/17), -Dye Allergies.

## 2017-05-18 NOTE — Patient Instructions (Signed)

## 2017-05-21 NOTE — Procedures (Signed)
Lumbar Epidural Steroid Injection - Interlaminar Approach with Fluoroscopic Guidance  Patient: Jose Ware      Date of Birth: 1935-02-01 MRN: [id number] PCP: [name], MD      Visit Date: 05/18/2017   Universal Protocol:     Consent Given By: the patient  Position: PRONE  Additional Comments: Vital signs were monitored before and after the procedure. Patient was prepped and draped in the usual sterile fashion. The correct patient, procedure, and site was verified.   Injection Procedure Details:  Procedure Site One Meds Administered:  Meds ordered this encounter  Medications  . methylPREDNISolone acetate (DEPO-MEDROL) injection 80 mg     Laterality: midline  Location/Site:  L1-L2  Needle size: 20 G  Needle type: Tuohy  Needle Placement: Paramedian epidural  Findings:   -Comments: Excellent flow of contrast into the epidural space.  Procedure Details: Using a paramedian approach from the side mentioned above, the region overlying the inferior lamina was localized under fluoroscopic visualization and the soft tissues overlying this structure were infiltrated with 4 ml. of 1% Lidocaine without Epinephrine. The Tuohy needle was inserted into the epidural space using a paramedian approach.   The epidural space was localized using loss of resistance along with lateral and bi-planar fluoroscopic views.  After negative aspirate for air, blood, and CSF, a 2 ml. volume of Isovue-250 was injected into the epidural space and the flow of contrast was observed. Radiographs were obtained for documentation purposes.    The injectate was administered into the level noted above.   Additional Comments:  The patient tolerated the procedure well Dressing: Band-Aid    Post-procedure details: Patient was observed during the procedure. Post-procedure instructions were reviewed.  Patient left the clinic in stable condition.

## 2017-05-21 NOTE — Progress Notes (Signed)
Jose Ware - 83 y.o. male MRN [id number]  Date of birth: 1934/06/13  Office Visit Note: Visit Date: 05/18/2017 PCP: [name], MD Referred by: [name], MD  Subjective: Chief Complaint  Patient presents with  . Lower Back - Pain   HPI: Mr.  Jose Ware is a very pleasant 83 year old gentleman who comes in today at the request of Dr. Paulla Fore for L1-2 interlaminar epidural steroid injection.  The patient's been having chronic worsening low back pain that sometimes radiates into both right and left legs.  He does not really endorse paresthesias.  He reports that standing for a long time does make it worse and he does take some Tylenol that seems to ease the pain a little bit.  MRI evidence is patchy with some changes at L1-2 with endplate edema and some mild stenosis.  Interestingly he has a fairly large Tarlov cyst in the sacrum.  Traditionally these reviewed is asymptomatic but his is quite large.  If no other reasoning for his pain was present it would be worth probably have him see surgeon to at least address that but most of the time these are just watched.  Otherwise if the injection does not help much look for an extra spinal source of his symptoms.    ROS Otherwise per HPI.  Assessment & Plan: Visit Diagnoses:  1. Radiculopathy of thoracolumbar region   2. Spondylosis without myelopathy or radiculopathy, lumbar region     Plan: No additional findings.   Meds & Orders:  Meds ordered this encounter  Medications  . methylPREDNISolone acetate (DEPO-MEDROL) injection 80 mg    Orders Placed This Encounter  Procedures  . XR C-ARM NO REPORT  . Epidural Steroid injection    Follow-up: Return if symptoms worsen or fail to improve, for Dr. Paulla Fore.   Procedures: No procedures performed  Lumbar Epidural Steroid Injection - Interlaminar Approach with Fluoroscopic Guidance  Patient: Jose Ware      Date of Birth: November 14, 1934 MRN: [id number] PCP: [name], MD      Visit Date: 05/18/2017   Universal Protocol:     Consent Given By: the patient  Position: PRONE  Additional Comments: Vital signs were monitored before and after the procedure. Patient was prepped and draped in the usual sterile fashion. The correct patient, procedure, and site was verified.   Injection Procedure Details:  Procedure Site One Meds Administered:  Meds ordered this encounter  Medications  . methylPREDNISolone acetate (DEPO-MEDROL) injection 80 mg     Laterality: midline  Location/Site:  L1-L2  Needle size: 20 G  Needle type: Tuohy  Needle Placement: Paramedian epidural  Findings:   -Comments: Excellent flow of contrast into the epidural space.  Procedure Details: Using a paramedian approach from the side mentioned above, the region overlying the inferior lamina was localized under fluoroscopic visualization and the soft tissues overlying this structure were infiltrated with 4 ml. of 1% Lidocaine without Epinephrine. The Tuohy needle was inserted into the epidural space using a paramedian approach.   The epidural space was localized using loss of resistance along with lateral and bi-planar fluoroscopic views.  After negative aspirate for air, blood, and CSF, a 2 ml. volume of Isovue-250 was injected into the epidural space and the flow of contrast was observed. Radiographs were obtained for documentation purposes.    The injectate was administered into the level noted above.   Additional Comments:  The patient tolerated the procedure well Dressing: Band-Aid  Post-procedure details: Patient was observed during the procedure. Post-procedure instructions were reviewed.  Patient left the clinic in stable condition.   Clinical History: MRI LUMBAR SPINE WITHOUT CONTRAST  TECHNIQUE: Multiplanar, multisequence MR imaging of the lumbar spine was performed. No intravenous contrast was administered.  COMPARISON:  None.  FINDINGS: Segmentation: Normal segmentation. Lowest disc space L5-S1.  Alignment: Slight retrolisthesis L1-2. Remaining alignment anatomic  Vertebrae: Negative for fracture. Multiple hemangiomata in the vertebral bodies. No worrisome mass lesion. Tarlov cysts in the sacral canal. The largest cyst on the left measures 17 x 29 mm on axial images.  Conus medullaris: Extends to the T12-L1. Level and appears normal.  Paraspinal and other soft tissues: Paraspinous muscles are symmetric. No retroperitoneal mass or adenopathy. No aortic aneurysm.  Disc levels:  L1-2: Mild retrolisthesis. Moderate disc degeneration with endplate spurring right greater than left. Mild facet degeneration. Moderate right foraminal narrowing. No significant spinal stenosis.  L2-3: Disc degeneration and spondylosis. No significant spinal or foraminal stenosis.  L3-4: Mild disc degeneration and spondylosis without spinal or foraminal stenosis.  L4-5: Moderate disc degeneration and endplate spurring. Moderate left foraminal narrowing due to spurring. Mild right foraminal narrowing. No significant spinal stenosis. Mild facet degeneration.  L5-S1: Mild disc and facet degeneration without spinal or foraminal stenosis.  IMPRESSION: Moderate right foraminal narrowing L1-2 due to spurring.  Mild disc and facet degeneration at L2-3, L3-4.  Moderate disc degeneration L4-5 with moderate left foraminal narrowing and mild right foraminal narrowing.  Mild degenerative change L5-S1 without stenosis.   Electronically Signed By: Franchot Gallo M.D. On: 11/02/2015 14:13   He reports that he quit smoking about 34 years ago. He has a 74.00 pack-year smoking history. he has never used smokeless tobacco. No results for input(s): HGBA1C, LABURIC in the last 8760 hours.  Objective:  VS:  HT:    WT:   BMI:     BP:122/62  HR:(!) 50bpm  TEMP:98.3 F (36.8 C)(Oral)  RESP:96 % Physical Exam  Ortho  Exam Imaging: No results found.  Past Medical/Family/Surgical/Social History: Medications & Allergies reviewed per EMR, new medications updated. Patient Active Problem List   Diagnosis Date Noted  . Spinal stenosis of lumbar region at multiple levels 04/30/2017  . Drug reaction 02/05/2017  . Senile purpura ([place]) 02/05/2017  . Dyslipidemia 11/16/2015  . NSVT (nonsustained ventricular tachycardia) ([place]) 11/08/2015  . CAD S/P PCI   . Ischemic cardiomyopathy   . History of embolic stroke   . History of adenomatous polyp of colon 01/18/2015  . Primary hyperparathyroidism ([place]) 12/28/2013  . History of stomach cancer 12/01/2013  . Fatigue 12/01/2013  . Lower back pain 03/24/2013  . Bradycardia 09/23/2012  . Bruising 09/23/2012  . B12 deficiency 09/17/2010  . History of prostate cancer 12/14/2008  . PERIPHERAL NEUROPATHY 10/18/2007  . PSORIASIS 10/15/2007  . Essential hypertension 11/27/2006  . DEGENERATIVE JOINT DISEASE 11/27/2006  . NEPHROLITHIASIS, HX OF 11/27/2006   Past Medical History:  Diagnosis Date  . Adenocarcinoma of prostate [facility])    XRT & Radiation seed implantation in 2010  . Adenomatous colon polyp   . CAD (coronary artery disease)    a. 11/04/15:  Acute inferolateral STEMI: S/p emergent DES of a very large LCx with extensive thrombus. Significant residual disease in the proximal LAD and distal RCA. S/p staged PCI of LAD and RCA 8/29.  Marland Kitchen CVA (cerebral infarction)    a. 0/4540: embolic CVA after heart cath   . CVA (cerebral infarction)    a. 10/2015:  embolic CVA after heart cath   . DIVERTICULITIS, HX OF 11/27/2006       . DJD (degenerative joint disease)    wrist - R   . Hernia   . History of blood transfusion 1985   with colon surgery  . Hypertension   . Ischemic cardiomyopathy    a. EF 25-30% by LV gram on cath 11/04/15. Appeared out of proportion to infarct although infarct was large. EF improved by Echo and now 40-45%.  . NEPHROLITHIASIS, HX OF 11/27/2006         . Peripheral neuropathy   . Psoriasis   . Tenosynovitis    Family History  Problem Relation Age of Onset  . Coronary artery disease Mother   . Alcohol abuse Father   . Cirrhosis Father   . Heart failure Sister   . Breast cancer Sister        W/involvement of right arm leading to amputation  . Coronary artery disease Brother   . Heart attack Brother   . Clotting disorder Brother   . Prostate cancer Neg Hx   . Colon cancer Neg Hx   . Diabetes Neg Hx   . Glaucoma Neg Hx   . Rectal cancer Neg Hx   . Esophageal cancer Neg Hx    Past Surgical History:  Procedure Laterality Date  . APPENDECTOMY  1985  . BACK SURGERY  1980   minor disk surgery: instrumentation placed and removed in a second procedure  . CARDIAC CATHETERIZATION N/A 11/04/2015   Procedure: Left Heart Cath and Coronary Angiography;  Surgeon: Peter M Martinique, MD;  Location: [place] [facility];  Service: Cardiovascular;  Laterality: N/A;  . CARDIAC CATHETERIZATION N/A 11/04/2015   Procedure: Coronary Stent Intervention;  Surgeon: Peter M Martinique, MD;  Location: [place] [facility];  Service: Cardiovascular;  Laterality: N/A;  . CARDIAC CATHETERIZATION N/A 11/06/2015   Procedure: Coronary Stent Intervention;  Surgeon: Leonie Man, MD;  Location: [place] [facility];  Service: Cardiovascular;  Laterality: N/A;  . CATARACT EXTRACTION, BILATERAL  2013  . CHOLECYSTECTOMY  1986  . COLON SURGERY  1980's   pt. had ileus, had colon surgery, requiring colostomy & then reversal & then dehisence of that wound & return to OR for repair & cholecystectomy    . COLONOSCOPY    . COLOSTOMY  1985   After colectomy for diverticulitis, pt. remarks during this surgery they "gave me the paddles two times  because of bleeding", pt. states it was unrelated to any anesthesia complication  . EYE SURGERY     /w IOL  . KNEE ARTHROSCOPY Left 2003  . KNEE SURGERY  1956   Left 1956, Right w/cartilage removed later  . PARATHYROIDECTOMY N/A  05/12/2014   Procedure: PARATHYROIDECTOMY;  Surgeon: Armandina Gemma, MD;  Location: [place];  Service: General;  Laterality: N/A;  . REVERSAL OF COLOSTOMY  1987  . TONSILLECTOMY  1946  . WRIST SURGERY Left    Remote complicated w/infection   Social History   Occupational History  . Not on file  Tobacco Use  . Smoking status: Former Smoker    Packs/day: 2.00    Years: 37.00    Pack years: 74.00    Last attempt to quit: 03/11/1983    Years since quitting: 34.2  . Smokeless tobacco: Never Used  . Tobacco comment: started smoking in the service; ICU 85' part of his stomach; appendix; coma   Substance and Sexual Activity  . Alcohol use: Yes  Alcohol/week: 0.0 oz    Comment: RARE  . Drug use: No  . Sexual activity: Not on file

## 2017-05-30 ENCOUNTER — Ambulatory Visit (INDEPENDENT_AMBULATORY_CARE_PROVIDER_SITE_OTHER): Payer: Medicare Other | Admitting: Family Medicine

## 2017-05-30 ENCOUNTER — Encounter: Payer: Self-pay | Admitting: Family Medicine

## 2017-05-30 VITALS — BP 118/62 | HR 58 | Temp 97.8°F | Ht 72.0 in | Wt 214.0 lb

## 2017-05-30 DIAGNOSIS — B029 Zoster without complications: Secondary | ICD-10-CM | POA: Diagnosis not present

## 2017-05-30 MED ORDER — VALACYCLOVIR HCL 1 G PO TABS: 1000.0000 mg | ORAL_TABLET | Freq: Three times a day (TID) | ORAL | 0 refills | Status: AC

## 2017-05-30 MED ORDER — HYDROCODONE-ACETAMINOPHEN 5-325 MG PO TABS: 1.0000 | ORAL_TABLET | Freq: Four times a day (QID) | ORAL | 0 refills | Status: DC | PRN

## 2017-05-30 MED ORDER — GABAPENTIN 100 MG PO CAPS: 100.0000 mg | ORAL_CAPSULE | Freq: Three times a day (TID) | ORAL | 3 refills | Status: DC

## 2017-05-30 NOTE — Progress Notes (Signed)
Jose Ware is a 83 y.o. male here for an acute visit.  History of Present Illness:   HPI: Rash, right chest and back. Painful and itchy. History of the same. No treatment.  PMHx, SurgHx, SocialHx, Medications, and Allergies were reviewed in the Visit Navigator and updated as appropriate.  Current Medications:   .  acetaminophen (TYLENOL) 325 MG tablet, Take 650 mg by mouth as needed., Disp: , Rfl:  .  amLODipine (NORVASC) 5 MG tablet, Take 1 tablet (5 mg total) by mouth daily., Disp: 90 tablet, Rfl: 3 .  aspirin EC 81 MG tablet, Take 1 tablet (81 mg total) by mouth daily., Disp: 90 tablet, Rfl: 1 .  atorvastatin (LIPITOR) 40 MG tablet, Take 1 tablet (40 mg total) by mouth daily., Disp: 90 tablet, Rfl: 3 .  beta carotene w/minerals (OCUVITE) tablet, Take 1 tablet by mouth daily., Disp: , Rfl:  .  carvedilol (COREG) 6.25 MG tablet, TAKE ONE TABLET BY MOUTH TWICE DAILY WITH MEALS, Disp: 180 tablet, Rfl: 3 .  clopidogrel (PLAVIX) 75 MG tablet, TAKE 1 TABLET BY MOUTH ONCE DAILY, Disp: 30 tablet, Rfl: 6 .  Cyanocobalamin (VITAMIN B-12) 5000 MCG TBDP, Take 5,000 mcg by mouth daily., Disp: , Rfl:  .  losartan (COZAAR) 100 MG tablet, Take 1 tablet (100 mg total) by mouth daily., Disp: 90 tablet, Rfl: 3 .  nitroGLYCERIN (NITROSTAT) 0.4 MG SL tablet, Place 1 tablet (0.4 mg total) under the tongue every 5 (five) minutes x 3 doses as needed for chest pain., Disp: 25 tablet, Rfl: 2 .  pantoprazole (PROTONIX) 40 MG tablet, Take 40 mg by mouth as needed., Disp: , Rfl:  .  spironolactone (ALDACTONE) 25 MG tablet, TAKE ONE TABLET BY MOUTH ONCE DAILY, Disp: 90 tablet, Rfl: 3  Allergies  Allergen Reactions  . Bee Venom Swelling    Facial swelling  . Betadine [Povidone Iodine] Other (See Comments)    blistering  . Doxycycline     Possible drug rash- back of right lower leg and onto left lower leg as well   Review of Systems:   Pertinent items are noted in the HPI. Otherwise, ROS is  negative.  Vitals:   Vitals:   05/30/17 1052  BP: 118/62  Pulse: (!) 58  Temp: 97.8 F (36.6 C)  TempSrc: Oral  SpO2: 97%  Weight: 214 lb (97.1 kg)  Height: 6' (1.829 m)     Body mass index is 29.02 kg/m.  Physical Exam:   Physical Exam  Constitutional: He is oriented to person, place, and time. He appears well-developed and well-nourished. No distress.  HENT:  Head: Normocephalic and atraumatic.  Right Ear: External ear normal.  Left Ear: External ear normal.  Nose: Nose normal.  Mouth/Throat: Oropharynx is clear and moist.  Eyes: Pupils are equal, round, and reactive to light. Conjunctivae and EOM are normal.  Neck: Normal range of motion. Neck supple.  Cardiovascular: Normal rate, regular rhythm and intact distal pulses.  Pulmonary/Chest: Effort normal.  Musculoskeletal: Normal range of motion.  Neurological: He is alert and oriented to person, place, and time.  Skin: Skin is warm and dry.  Shingles rash right thoracic dermatome.  Psychiatric: He has a normal mood and affect. His behavior is normal. Judgment and thought content normal.  Nursing note and vitals reviewed.   Assessment and Plan:   1. Herpes zoster without complication - valACYclovir (VALTREX) 1000 MG tablet; Take 1 tablet (1,000 mg total) by mouth 3 (three) times daily for  7 days.  Dispense: 21 tablet; Refill: 0 - gabapentin (NEURONTIN) 100 MG capsule; Take 1 capsule (100 mg total) by mouth 3 (three) times daily.  Dispense: 90 capsule; Refill: 3 - HYDROcodone-acetaminophen (NORCO/VICODIN) 5-325 MG tablet; Take 1 tablet by mouth every 6 (six) hours as needed for moderate pain.  Dispense: 20 tablet; Refill: 0   . Reviewed expectations re: course of current medical issues. . Discussed self-management of symptoms. . Outlined signs and symptoms indicating need for more acute intervention. . Patient verbalized understanding and all questions were answered. Marland Kitchen Health Maintenance issues including appropriate  healthy diet, exercise, and smoking avoidance were discussed with patient. . See orders for this visit as documented in the electronic medical record. . Patient received an After Visit Summary.   Briscoe Deutscher, DO [place], [facility] 05/30/2017

## 2017-06-01 ENCOUNTER — Encounter: Payer: Self-pay | Admitting: Family Medicine

## 2017-06-04 NOTE — Telephone Encounter (Signed)
See note.   Copied from [place]. Topic: Appointment Scheduling - Scheduling Inquiry for Clinic >> Jun 04, 2017 12:06 PM Synthia Innocent wrote: Reason for CRM: seen by Dr Juleen China on 05/29/17, was told to follow up in a week w/ Dr hunter, no appts available, ok to scheduled with Dr Juleen China again? Need to see Dr hunter? Please advise

## 2017-06-04 NOTE — Telephone Encounter (Signed)
See CRM attached ok to make f/u with you?

## 2017-06-08 ENCOUNTER — Encounter: Payer: Self-pay | Admitting: Family Medicine

## 2017-06-12 ENCOUNTER — Ambulatory Visit (INDEPENDENT_AMBULATORY_CARE_PROVIDER_SITE_OTHER): Payer: Medicare Other | Admitting: Family Medicine

## 2017-06-12 ENCOUNTER — Encounter: Payer: Self-pay | Admitting: Family Medicine

## 2017-06-12 ENCOUNTER — Ambulatory Visit: Payer: Self-pay | Admitting: *Deleted

## 2017-06-12 VITALS — BP 120/64 | HR 56 | Temp 97.5°F | Ht 72.0 in | Wt 211.4 lb

## 2017-06-12 DIAGNOSIS — B028 Zoster with other complications: Secondary | ICD-10-CM | POA: Diagnosis not present

## 2017-06-12 DIAGNOSIS — L03312 Cellulitis of back [any part except buttock]: Secondary | ICD-10-CM | POA: Diagnosis not present

## 2017-06-12 MED ORDER — MUPIROCIN 2 % EX OINT: 1.0000 "application " | TOPICAL_OINTMENT | Freq: Two times a day (BID) | CUTANEOUS | 0 refills | Status: DC

## 2017-06-12 MED ORDER — CLINDAMYCIN HCL 300 MG PO CAPS: 300.0000 mg | ORAL_CAPSULE | Freq: Three times a day (TID) | ORAL | 0 refills | Status: DC

## 2017-06-12 MED ORDER — HYDROCODONE-ACETAMINOPHEN 5-325 MG PO TABS: 1.0000 | ORAL_TABLET | Freq: Four times a day (QID) | ORAL | 0 refills | Status: DC | PRN

## 2017-06-12 NOTE — Telephone Encounter (Signed)
Pt's wife called the patient having a shingles rash on his back that is bleeding and she feels like it is getting worst. His t-shirt when he wakes in the morning is covered with blood.  She states it looks like an open wound now. She is requesting an appointment for today with his provider. Per protocol to see pcp within 24 hours.  [facility] at [place] was notified and appointment made with E. Juleen China, DO this morning.  Reason for Disposition . Nursing judgment  Answer Assessment - Initial Assessment Questions 1. REASON FOR CALL: "What is your main concern right now?"     Bleeding from the shingles rash 2. ONSET: "When did the ___ start?"     n 3. SEVERITY: "How bad is the ___?"     bad 4. FEVER: "Do you have a fever?"     no 5. OTHER SYMPTOMS: "Do you have any other new symptoms?"     Feeling tired 6. INTERVENTIONS AND RESPONSE: "What have you done so far to try to make this better? What medications have you used?"     Just try to keep the area cover 7. PREGNANCY: "Is there any chance you are pregnant?"     no  Answer Assessment - Initial Assessment Questions 1. APPEARANCE of RASH: "Describe the rash." (e.g., spots, blisters, raised areas, skin peeling, scaly)     Peeling and bleeding 2. SIZE: "How big are the spots?" (e.g., tip of pen, eraser, coin; inches, centimeters)     Large on back  3. LOCATION: "Where is the rash located?"     back 4. COLOR: "What color is the rash?" (Note: It is difficult to assess rash color in people with darker-colored skin. When this situation occurs, simply ask the caller to describe what they see.)     Not sure 5. ONSET: "When did the rash begin?"     Bleeding started for about a week 6. FEVER: "Do you have a fever?" If so, ask: "What is your temperature, how was it measured, and when did it start?"     Felt hot, could not check the temp 7. ITCHING: "Does the rash itch?" If so, ask: "How bad is the itch?" (Scale 1-10; or mild,  moderate, severe)     Yes certain areas only the dry areas 8. CAUSE: "What do you think is causing the rash?"     shingles 9. MEDICATION FACTORS: "Have you started any new medications within the last 2 weeks?" (e.g., antibiotics)      Gabapentin 10. OTHER SYMPTOMS: "Do you have any other symptoms?" (e.g., dizziness, headache, sore throat, joint pain)       Tiredness, no energy 11. PREGNANCY: "Is there any chance you are pregnant?" "When was your last menstrual period?"       n/a  Protocols used: NO GUIDELINE OR REFERENCE AVAILABLE-A-AH, NO GUIDELINE AVAILABLE-A-AH, RASH OR REDNESS - [facility]

## 2017-06-12 NOTE — Patient Instructions (Signed)
You may increase the Neurontin to 2 pills 3 times a day as needed.

## 2017-06-12 NOTE — Progress Notes (Signed)
Jose Ware is a 83 y.o. male here for an acute visit.  History of Present Illness:   Lonell Grandchild, CMA acting as scribe for Dr. Briscoe Deutscher.   HPI: Rash on back under both arms and around chest on right side. Bleeding and oozing onto clothing x 1 week.  He is finished with the Valtrex.  He is taking gabapentin 100 mg p.o. 3 times daily with some relief of his pain but also with some sedation.  He does have Norco to be used sparingly and does so.  No fevers or chills.  His wife is a retired Marine scientist and is helping with wound care.  PMHx, SurgHx, SocialHx, Medications, and Allergies were reviewed in the Visit Navigator and updated as appropriate.  Current Medications:   .  acetaminophen (TYLENOL) 325 MG tablet, Take 650 mg by mouth as needed., Disp: , Rfl:  .  amLODipine (NORVASC) 5 MG tablet, Take 1 tablet (5 mg total) by mouth daily., Disp: 90 tablet, Rfl: 3 .  aspirin EC 81 MG tablet, Take 1 tablet (81 mg total) by mouth daily., Disp: 90 tablet, Rfl: 1 .  atorvastatin (LIPITOR) 40 MG tablet, Take 1 tablet (40 mg total) by mouth daily., Disp: 90 tablet, Rfl: 3 .  beta carotene w/minerals (OCUVITE) tablet, Take 1 tablet by mouth daily., Disp: , Rfl:  .  carvedilol (COREG) 6.25 MG tablet, TAKE ONE TABLET BY MOUTH TWICE DAILY WITH MEALS, Disp: 180 tablet, Rfl: 3 .  clopidogrel (PLAVIX) 75 MG tablet, TAKE 1 TABLET BY MOUTH ONCE DAILY, Disp: 30 tablet, Rfl: 6 .  Cyanocobalamin (VITAMIN B-12) 5000 MCG TBDP, Take 5,000 mcg by mouth daily., Disp: , Rfl:  .  gabapentin (NEURONTIN) 100 MG capsule, Take 1 capsule (100 mg total) by mouth 3 (three) times daily., Disp: 90 capsule, Rfl: 3 .  HYDROcodone-acetaminophen (NORCO/VICODIN) 5-325 MG tablet, Take 1 tablet by mouth every 6 (six) hours as needed for moderate pain., Disp: 20 tablet, Rfl: 0 .  losartan (COZAAR) 100 MG tablet, Take 1 tablet (100 mg total) by mouth daily., Disp: 90 tablet, Rfl: 3 .  nitroGLYCERIN (NITROSTAT) 0.4 MG SL  tablet, Place 1 tablet (0.4 mg total) under the tongue every 5 (five) minutes x 3 doses as needed for chest pain., Disp: 25 tablet, Rfl: 2 .  pantoprazole (PROTONIX) 40 MG tablet, Take 40 mg by mouth as needed., Disp: , Rfl:  .  spironolactone (ALDACTONE) 25 MG tablet, TAKE ONE TABLET BY MOUTH ONCE DAILY, Disp: 90 tablet, Rfl: 3   Allergies  Allergen Reactions  . Bee Venom Swelling    Facial swelling  . Betadine [Povidone Iodine] Other (See Comments)    blistering  . Doxycycline     Possible drug rash- back of right lower leg and onto left lower leg as well   Review of Systems:   Pertinent items are noted in the HPI. Otherwise, ROS is negative.  Vitals:   Vitals:   06/12/17 1026  BP: 120/64  Pulse: (!) 56  Temp: (!) 97.5 F (36.4 C)  TempSrc: Oral  SpO2: 96%  Weight: 211 lb 6.4 oz (95.9 kg)  Height: 6' (1.829 m)     Body mass index is 28.67 kg/m.  Physical Exam:   Physical Exam  Constitutional: He is oriented to person, place, and time. He appears well-developed and well-nourished. No distress.  HENT:  Head: Normocephalic and atraumatic.  Right Ear: External ear normal.  Left Ear: External ear normal.  Nose: Nose  normal.  Mouth/Throat: Oropharynx is clear and moist.  Eyes: Pupils are equal, round, and reactive to light. Conjunctivae and EOM are normal.  Neck: Normal range of motion. Neck supple.  Cardiovascular: Normal rate and regular rhythm.  Pulmonary/Chest: Effort normal.  Abdominal: Soft.  Neurological: He is alert and oriented to person, place, and time.  Skin: Skin is warm.  Zoster lesions drying up. Right scapular region with a palm-sized area of unroofed blisters that is now erythematous, with some oozing and sloughing.   Psychiatric: He has a normal mood and affect. His behavior is normal. Judgment and thought content normal.  Nursing note and vitals reviewed.   Assessment and Plan:   1. Herpes zoster with other complication Shingles lesions are  actually drying up but improving.  He still continues to have the paresthesias.  He is a high risk for postherpetic neuralgia.  I have given him leeway to increase his gabapentin to 200 mg p.o. 3 times daily if he tolerates them.  I did refill his hydrocodone today since he is using it so sparingly.  - HYDROcodone-acetaminophen (NORCO/VICODIN) 5-325 MG tablet; Take 1 tablet by mouth every 6 (six) hours as needed for moderate pain.  Dispense: 24 tablet; Refill: 0  2. Cellulitis of back It does appear that he has a mild secondary infection now.  His large area in his right scapular region that is raw and red.  Treatment as below.  Red flags reviewed.  - mupirocin ointment (BACTROBAN) 2 %; Apply 1 application topically 2 (two) times daily.  Dispense: 22 g; Refill: 0 - clindamycin (CLEOCIN) 300 MG capsule; Take 1 capsule (300 mg total) by mouth 3 (three) times daily.  Dispense: 30 capsule; Refill: 0  . Reviewed expectations re: course of current medical issues. . Discussed self-management of symptoms. . Outlined signs and symptoms indicating need for more acute intervention. . Patient verbalized understanding and all questions were answered. Marland Kitchen Health Maintenance issues including appropriate healthy diet, exercise, and smoking avoidance were discussed with patient. . See orders for this visit as documented in the electronic medical record. . Patient received an After Visit Summary.  CMA served as Education administrator during this visit. History, Physical, and Plan performed by medical provider. The above documentation has been reviewed and is accurate and complete. Briscoe Deutscher, D.O.  Briscoe Deutscher, DO [place], [facility] 06/12/2017

## 2017-06-18 ENCOUNTER — Encounter: Payer: Self-pay | Admitting: Family Medicine

## 2017-06-19 ENCOUNTER — Ambulatory Visit (INDEPENDENT_AMBULATORY_CARE_PROVIDER_SITE_OTHER): Payer: Medicare Other | Admitting: Family Medicine

## 2017-06-19 ENCOUNTER — Encounter: Payer: Self-pay | Admitting: Family Medicine

## 2017-06-19 VITALS — BP 104/56 | HR 53 | Temp 97.7°F | Ht 72.0 in | Wt 211.6 lb

## 2017-06-19 DIAGNOSIS — B029 Zoster without complications: Secondary | ICD-10-CM

## 2017-06-19 NOTE — Progress Notes (Signed)
Subjective:  Jose Ware is a 83 y.o. year old very pleasant male patient who presents for/with See problem oriented charting ROS- continued pain in distribution of shingles rash. Less discharge. Redness surrounding rash has decreased in intensity. No fever or chills   Past Medical History-  Patient Active Problem List   Diagnosis Date Noted  . CAD S/P PCI     Priority: High  . Ischemic cardiomyopathy     Priority: High  . History of embolic stroke     Priority: High  . Primary hyperparathyroidism ([place]) 12/28/2013    Priority: High  . Fatigue 12/01/2013    Priority: High  . History of prostate cancer 12/14/2008    Priority: High  . Shingles 06/20/2017    Priority: Medium  . Spinal stenosis of lumbar region at multiple levels 04/30/2017    Priority: Medium  . Dyslipidemia 11/16/2015    Priority: Medium  . History of adenomatous polyp of colon 01/18/2015    Priority: Medium  . PSORIASIS 10/15/2007    Priority: Medium  . Essential hypertension 11/27/2006    Priority: Medium  . Drug reaction 02/05/2017    Priority: Low  . Senile purpura ([place]) 02/05/2017    Priority: Low  . NSVT (nonsustained ventricular tachycardia) ([place]) 11/08/2015    Priority: Low  . History of stomach cancer 12/01/2013    Priority: Low  . Lower back pain 03/24/2013    Priority: Low  . Bradycardia 09/23/2012    Priority: Low  . Bruising 09/23/2012    Priority: Low  . B12 deficiency 09/17/2010    Priority: Low  . PERIPHERAL NEUROPATHY 10/18/2007    Priority: Low  . DEGENERATIVE JOINT DISEASE 11/27/2006    Priority: Low  . NEPHROLITHIASIS, HX OF 11/27/2006    Priority: Low    Medications- reviewed and updated Current Outpatient Medications  Medication Sig Dispense Refill  . acetaminophen (TYLENOL) 325 MG tablet Take 650 mg by mouth as needed.    Marland Kitchen amLODipine (NORVASC) 5 MG tablet Take 1 tablet (5 mg total) by mouth daily. 90 tablet 3  . aspirin EC 81 MG tablet Take 1 tablet (81 mg total)  by mouth daily. 90 tablet 1  . atorvastatin (LIPITOR) 40 MG tablet Take 1 tablet (40 mg total) by mouth daily. 90 tablet 3  . beta carotene w/minerals (OCUVITE) tablet Take 1 tablet by mouth daily.    . carvedilol (COREG) 6.25 MG tablet TAKE ONE TABLET BY MOUTH TWICE DAILY WITH MEALS 180 tablet 3  . clindamycin (CLEOCIN) 300 MG capsule Take 1 capsule (300 mg total) by mouth 3 (three) times daily. 30 capsule 0  . clopidogrel (PLAVIX) 75 MG tablet TAKE 1 TABLET BY MOUTH ONCE DAILY 30 tablet 6  . Cyanocobalamin (VITAMIN B-12) 5000 MCG TBDP Take 5,000 mcg by mouth daily.    Marland Kitchen gabapentin (NEURONTIN) 100 MG capsule Take 1 capsule (100 mg total) by mouth 3 (three) times daily. 90 capsule 3  . HYDROcodone-acetaminophen (NORCO/VICODIN) 5-325 MG tablet Take 1 tablet by mouth every 6 (six) hours as needed for moderate pain. 24 tablet 0  . losartan (COZAAR) 100 MG tablet Take 1 tablet (100 mg total) by mouth daily. 90 tablet 3  . mupirocin ointment (BACTROBAN) 2 % Apply 1 application topically 2 (two) times daily. 22 g 0  . nitroGLYCERIN (NITROSTAT) 0.4 MG SL tablet Place 1 tablet (0.4 mg total) under the tongue every 5 (five) minutes x 3 doses as needed for chest pain. 25 tablet 2  .  pantoprazole (PROTONIX) 40 MG tablet Take 40 mg by mouth as needed.    Marland Kitchen spironolactone (ALDACTONE) 25 MG tablet TAKE ONE TABLET BY MOUTH ONCE DAILY 90 tablet 3   No current facility-administered medications for this visit.     Objective: BP (!) 104/56 (BP Location: Left Arm, Patient Position: Sitting, Cuff Size: Large)   Pulse (!) 53   Temp 97.7 F (36.5 C) (Oral)   Ht 6' (1.829 m)   Wt 211 lb 9.6 oz (96 kg)   SpO2 96%   BMI 28.70 kg/m  Gen: NAD, resting comfortably CV: RRR no murmurs rubs or gallops- not bradycardic on my exam Lungs: CTAB no crackles, wheeze, rhonchi Ext: no edema Skin: warm, dry. In t2-t3 distribution on right side- multiple scabbed over vesicles. In a few areas there is noted to be lack of  epidermis particularly on the back. Patient tender to touch through distribution.  Neuro: walks with walking stick  Assessment/Plan:  Shingles S: seen a week ago for shingles of right side likely T2-T3 distribution.He took valtrex at beginning of course. Despite valtrex, he was bleeding and oozing from the rash for over a week- was seen back with concern for secondary infection due to surrounding redness as well. He was treated with a course of antibiotic- clindamycin. At that time some lesions were drying up- high risk for post herpetic neuralgia thought. Gabapentin was increased to 200mg  TID. Was given hydrocodone refill as well as bacroban and clindamycin for secondary cellulitis   For pain- At last visit on gabapentin 100mg  TID- helps some with pain but also makes him tired. Uses norco sparingly. He didn't tolerate the gabapentin at 200mg - so went back to 100mg . Still has at least 6 hydrocodone and using less frequently.   Rash has significantly dried up- they are almost out of the bactroban but the more intense erythema has resolved- no longer appears infected.  A/P: Shingles much improved. Secondary cellulitis resolved. We discussed conservative care and beginning to wash the back/lesions some- wife is concerned about the scaling that was produced. See avs instructions  Follow up planned to see if we can reduce gabapentin Future Appointments  Date Time Provider [place]  06/25/2017  8:20 AM Gerda Diss, DO [facility]  07/20/2017  3:30 PM [name], MD [facility]  10/05/2017  8:15 AM [name], MD [facility]  11/05/2017  9:20 AM Martinique, Peter M, MD [facility]   Time Stamp The duration of face-to-face time during this visit was greater than 15 minutes. Greater than 50% of this time was spent in counseling, explanation of diagnosis, planning of further management, and/or coordination of care including discussion of distress and pain of this,  importance of vaccination at later date, discussing care of lesions and bathing.    Return precautions advised.  Garret Reddish, MD

## 2017-06-19 NOTE — Patient Instructions (Signed)
Lets check back in 1 month from now  No changes in your medicines today- continue gabapentin unless your pain gets way better. I suspect we may need to be on this for a few months if not longer  Can wash daily with soapy water- dont scrub hard. Finish bactroban then can use plain vaseline on open areas  See me back sooner if new or worsening symptoms

## 2017-06-20 DIAGNOSIS — B0229 Other postherpetic nervous system involvement: Secondary | ICD-10-CM | POA: Insufficient documentation

## 2017-06-20 NOTE — Assessment & Plan Note (Signed)
S: seen a week ago for shingles of right side likely T2-T3 distribution.He took valtrex at beginning of course. Despite valtrex, he was bleeding and oozing from the rash for over a week- was seen back with concern for secondary infection due to surrounding redness as well. He was treated with a course of antibiotic- clindamycin. At that time some lesions were drying up- high risk for post herpetic neuralgia thought. Gabapentin was increased to 200mg  TID. Was given hydrocodone refill as well as bacroban and clindamycin for secondary cellulitis   For pain- At last visit on gabapentin 100mg  TID- helps some with pain but also makes him tired. Uses norco sparingly. He didn't tolerate the gabapentin at 200mg - so went back to 100mg . Still has at least 6 hydrocodone and using less frequently.   Rash has significantly dried up- they are almost out of the bactroban but the more intense erythema has resolved- no longer appears infected.  A/P: Shingles much improved. Secondary cellulitis resolved. We discussed conservative care and beginning to wash the back/lesions some- wife is concerned about the scaling that was produced. See avs instructions

## 2017-06-25 ENCOUNTER — Ambulatory Visit: Payer: Medicare Other | Admitting: Sports Medicine

## 2017-06-25 ENCOUNTER — Encounter: Payer: Self-pay | Admitting: Sports Medicine

## 2017-06-25 VITALS — BP 102/60 | HR 48 | Ht 72.0 in | Wt 214.2 lb

## 2017-06-25 DIAGNOSIS — B028 Zoster with other complications: Secondary | ICD-10-CM

## 2017-06-25 DIAGNOSIS — L03312 Cellulitis of back [any part except buttock]: Secondary | ICD-10-CM | POA: Diagnosis not present

## 2017-06-25 DIAGNOSIS — G8929 Other chronic pain: Secondary | ICD-10-CM | POA: Diagnosis not present

## 2017-06-25 DIAGNOSIS — M479 Spondylosis, unspecified: Secondary | ICD-10-CM | POA: Insufficient documentation

## 2017-06-25 DIAGNOSIS — M48061 Spinal stenosis, lumbar region without neurogenic claudication: Secondary | ICD-10-CM | POA: Diagnosis not present

## 2017-06-25 DIAGNOSIS — M545 Low back pain: Secondary | ICD-10-CM

## 2017-06-25 MED ORDER — LIDOCAINE 5 % EX PTCH: 1.0000 | MEDICATED_PATCH | CUTANEOUS | 0 refills | Status: DC

## 2017-06-25 NOTE — Progress Notes (Signed)
Jose Ware. Jose Ware, [name] [facility] [place]  Jose Ware - 83 y.o. male MRN [id number]  Date of birth: 1934/11/26  Visit Date: 06/25/2017  PCP: [name], MD   Referred by: [name], MD  Scribe for today's visit: Josepha Pigg, CMA     SUBJECTIVE:  Jose Ware "Jose Ware" is here for Follow-up (chronic LBP)  04/30/2017: His chronic low back pain symptoms INITIALLY: Began several years ago after falling from a roof into the snow.  Described as 8-9/10 sharp pain at it's worst after prolonged, nonradiating Worsened with prolonged periods of standing.  Notes that if he stands too long, then his knees lock and he has a hard time walking Improved with leaning, sitting Additional associated symptoms include: no N/T into B LEs, no increase in symptoms w/ coughing/sneezing   At this time symptoms are worsening compared to onset w/ increased pain He had been taking Excedrin and Aleve but stopped after being put on Plavix He has tried Voltaren with minimal relief. Dr. Yong Channel advised him to take Tylenol prn.   Hx of back surgery in 1961 X-ray L-spine 08/24/15.   06/25/2017: Compared to the last office visit, his previously described symptoms show no change. Pain is worse after standing for long periods of time. Pain improves with movement.  Current symptoms are moderate & are nonradiating He has been using Voltaren and taking tylenol with some relief. He received epidural with Dr. Ernestina Patches 05/18/2017 and noticed minimal relief. He was dx with shingles shortly after the injection. He continues to have LBP and isn't sure if it is flaring up because of his shingles.   ROS Reports night time disturbances. Denies fevers, chills, or night sweats. Denies unexplained weight loss. Denies personal history of cancer. Denies changes in bowel or bladder habits. Denies recent unreported falls. Denies new or worsening  dyspnea or wheezing. Denies headaches or dizziness.  Denies numbness, tingling or weakness  In the extremities.  Denies dizziness or presyncopal episodes Reports lower extremity edema    HISTORY & PERTINENT PRIOR DATA:  Prior History reviewed and updated per electronic medical record.  Significant/pertinent history, findings, studies include:  reports that he quit smoking about 34 years ago. He has a 74.00 pack-year smoking history. He has never used smokeless tobacco. No results for input(s): HGBA1C, LABURIC, CREATINE in the last 8760 hours. No specialty comments available. No problems updated.  OBJECTIVE:  VS:  HT:6' (182.9 cm)   WT:214 lb 3.2 oz (97.2 kg)  BMI:29.04    BP:102/60  HR:(Abnormal) 48bpm  TEMP: ( )  RESP:95 %   PHYSICAL EXAM: Constitutional: WDWN, Non-toxic appearing. Psychiatric: Alert & appropriately interactive.  Not depressed or anxious appearing. Respiratory: No increased work of breathing.  Trachea Midline Eyes: Pupils are equal.  EOM intact without nystagmus.  No scleral icterus  Vascular Exam: warm to touch no edema  upper and lower extremity neuro exam: unremarkable  MSK Exam: He does have well-healed post zoster scarring along the midportion of the spine but is doing quite well from a healing standpoint.  He is negative straight leg raise bilaterally.  He has overall good improvement in his ability to ambulate and walks with a more erect posture.   ASSESSMENT & PLAN:   1. Herpes zoster with other complication   2. Cellulitis of back   3. Chronic bilateral low back pain without sciatica   4. Spinal stenosis of lumbar region at  multiple levels   5. Spondylosis     PLAN: Overall he is actually done quite well following the injection.  He does have continued pain that is present really only with after prolonged standing but his ambulation and his day-to-day limitations are minimal.  He does report being happy with his progress.  We did discuss  that he can return to Dr. Ernestina Patches for repeat injections if these symptoms do's 10 to worsen over the next several months but otherwise we will plan to see him back just as needed.  His shingles is healing well.  He does have some pain directly over this area and we will get him lidocaine patches to see if this is beneficial for the post shingles pain.  Follow-up: Return if symptoms worsen or fail to improve.        Please see additional documentation for Objective, Assessment and Plan sections. Pertinent additional documentation may be included in corresponding procedure notes, imaging studies, problem based documentation and patient instructions. Please see these sections of the encounter for additional information regarding this visit.  CMA/ATC served as Education administrator during this visit. History, Physical, and Plan performed by medical provider. Documentation and orders reviewed and attested to.      Gerda Diss, [place] [facility] Physician

## 2017-07-14 DIAGNOSIS — R310 Gross hematuria: Secondary | ICD-10-CM | POA: Diagnosis not present

## 2017-07-17 ENCOUNTER — Encounter: Payer: Self-pay | Admitting: Family Medicine

## 2017-07-17 ENCOUNTER — Encounter: Payer: Self-pay | Admitting: Sports Medicine

## 2017-07-20 ENCOUNTER — Encounter: Payer: Self-pay | Admitting: Family Medicine

## 2017-07-20 ENCOUNTER — Ambulatory Visit (INDEPENDENT_AMBULATORY_CARE_PROVIDER_SITE_OTHER): Payer: Medicare Other | Admitting: Family Medicine

## 2017-07-20 VITALS — BP 124/80 | HR 52 | Temp 97.9°F | Ht 72.0 in | Wt 218.0 lb

## 2017-07-20 DIAGNOSIS — I10 Essential (primary) hypertension: Secondary | ICD-10-CM

## 2017-07-20 DIAGNOSIS — B0229 Other postherpetic nervous system involvement: Secondary | ICD-10-CM

## 2017-07-20 NOTE — Patient Instructions (Addendum)
Continue gabapentin 100mg  three times a day. Perhaps we can reduce at next visit to twice a day if pain more in 2-3/10 range. Try the Lidoderm patches if affordable on the 2 most painful spots to see if we can wean off the gabapentin since its not great for your balance.   Follow up 3-6 months or sooner if you need [place] or pain level goes down enough that we can discuss reducing dose

## 2017-07-20 NOTE — Assessment & Plan Note (Addendum)
S: He overall is doing much better but has extensive scarring and he has 2 spots -one on back and one in axilla that are still pretty painful- about 5-6/10 in these areas. If he leans back and hits spot on his back can really hurt and wrap around the chest. Still taking gabapentin 100mg  TID and feels this is affecting balance some.   He is off all antibiotics for the cellulitis he developed.  He is not requiring breakthrough pain medication such as hydrocodone anymore.  He did get letter about lidoderm being covered finally.  A/P: I have a strong suspicion this will be postherpetic neuralgia.  We will continue gabapentin 100 mg 3 times daily.   he does have some imbalance on gabapentin.  We consider Lyrica but suspect he would have similar symptoms.  With his level of pain do not think we can start reducing gabapentin.  We discussed considering this if his pain level gets down to 2 or 3 out of 10 regularly.  He will follow-up with [place] in 3 to 6 months.  I did encourage him to pick up Lidoderm patches- may be able to reduce gabapentin if this helps

## 2017-07-20 NOTE — Progress Notes (Signed)
Subjective:  Jose Ware is a 83 y.o. year old very pleasant male patient who presents for/with See problem oriented charting ROS- continued pain on right chest and into right back. No left sided or central chest pain.  No shortness of breath.  No increased edema.  Past Medical History-  Patient Active Problem List   Diagnosis Date Noted  . CAD S/P PCI     Priority: High  . Ischemic cardiomyopathy     Priority: High  . History of embolic stroke     Priority: High  . Primary hyperparathyroidism ([place]) 12/28/2013    Priority: High  . Fatigue 12/01/2013    Priority: High  . History of prostate cancer 12/14/2008    Priority: High  . Post herpetic neuralgia 06/20/2017    Priority: Medium  . Spinal stenosis of lumbar region at multiple levels 04/30/2017    Priority: Medium  . Dyslipidemia 11/16/2015    Priority: Medium  . History of adenomatous polyp of colon 01/18/2015    Priority: Medium  . PSORIASIS 10/15/2007    Priority: Medium  . Essential hypertension 11/27/2006    Priority: Medium  . Drug reaction 02/05/2017    Priority: Low  . Senile purpura ([place]) 02/05/2017    Priority: Low  . NSVT (nonsustained ventricular tachycardia) ([place]) 11/08/2015    Priority: Low  . History of stomach cancer 12/01/2013    Priority: Low  . Lower back pain 03/24/2013    Priority: Low  . Bradycardia 09/23/2012    Priority: Low  . Bruising 09/23/2012    Priority: Low  . B12 deficiency 09/17/2010    Priority: Low  . PERIPHERAL NEUROPATHY 10/18/2007    Priority: Low  . DEGENERATIVE JOINT DISEASE 11/27/2006    Priority: Low  . NEPHROLITHIASIS, HX OF 11/27/2006    Priority: Low  . Spondylosis 06/25/2017    Medications- reviewed and updated Current Outpatient Medications  Medication Sig Dispense Refill  . acetaminophen (TYLENOL) 325 MG tablet Take 650 mg by mouth as needed.    Marland Kitchen amLODipine (NORVASC) 5 MG tablet Take 1 tablet (5 mg total) by mouth daily. 90 tablet 3  . aspirin EC 81  MG tablet Take 1 tablet (81 mg total) by mouth daily. 90 tablet 1  . atorvastatin (LIPITOR) 40 MG tablet Take 1 tablet (40 mg total) by mouth daily. 90 tablet 3  . beta carotene w/minerals (OCUVITE) tablet Take 1 tablet by mouth daily.    . carvedilol (COREG) 6.25 MG tablet TAKE ONE TABLET BY MOUTH TWICE DAILY WITH MEALS 180 tablet 3  . clopidogrel (PLAVIX) 75 MG tablet TAKE 1 TABLET BY MOUTH ONCE DAILY 30 tablet 6  . Cyanocobalamin (VITAMIN B-12) 5000 MCG TBDP Take 5,000 mcg by mouth daily.    Marland Kitchen gabapentin (NEURONTIN) 100 MG capsule Take 1 capsule (100 mg total) by mouth 3 (three) times daily. 90 capsule 3  . losartan (COZAAR) 100 MG tablet Take 1 tablet (100 mg total) by mouth daily. 90 tablet 3  . nitroGLYCERIN (NITROSTAT) 0.4 MG SL tablet Place 1 tablet (0.4 mg total) under the tongue every 5 (five) minutes x 3 doses as needed for chest pain. 25 tablet 2  . pantoprazole (PROTONIX) 40 MG tablet Take 40 mg by mouth as needed.    Marland Kitchen spironolactone (ALDACTONE) 25 MG tablet TAKE ONE TABLET BY MOUTH ONCE DAILY 90 tablet 3   No current facility-administered medications for this visit.     Objective: BP 124/80 (BP Location: Left Arm, Patient  Position: Sitting, Cuff Size: Normal)   Pulse (!) 52   Temp 97.9 F (36.6 C) (Oral)   Ht 6' (1.829 m)   Wt 218 lb (98.9 kg)   SpO2 97%   BMI 29.57 kg/m  Gen: NAD, resting comfortably CV: RRR no murmurs rubs or gallops Lungs: CTAB no crackles, wheeze, rhonchi Abdomen: soft/nontender/nondistended Ext: trace edema Skin: warm, dry, extensive scarring around t2-t3 distribution particularly on back. One area on back in about 2 x 2 cm and one area just in front of axilla that are very tender to touch. Other areas tolerates exam without difficulty Neuro: uses cane to walk  Assessment/Plan:  Hypertension S: controlled on losartan 100mg ,  spironolactone 25 mg, Coreg 6.25 mg twice daily, amlodipine 5 mg BP Readings from Last 3 Encounters:  07/20/17 124/80   06/25/17 102/60  06/19/17 (!) 104/56  A/P: We discussed blood pressure goal of <140/90. Continue current meds: Continue current medication  Post herpetic neuralgia S: He overall is doing much better but has extensive scarring and he has 2 spots -one on back and one in axilla that are still pretty painful- about 5-6/10 in these areas. If he leans back and hits spot on his back can really hurt and wrap around the chest. Still taking gabapentin 100mg  TID and feels this is affecting balance some.   He is off all antibiotics for the cellulitis he developed.  He is not requiring breakthrough pain medication such as hydrocodone anymore.  He did get letter about lidoderm being covered finally.  A/P: I have a strong suspicion this will be postherpetic neuralgia.  We will continue gabapentin 100 mg 3 times daily.   he does have some imbalance on gabapentin.  We consider Lyrica but suspect he would have similar symptoms.  With his level of pain do not think we can start reducing gabapentin.  We discussed considering this if his pain level gets down to 2 or 3 out of 10 regularly.  He will follow-up with [place] in 3 to 6 months.  I did encourage him to pick up Lidoderm patches- may be able to reduce gabapentin if this helps   Future Appointments  Date Time Provider [place]  10/05/2017  8:15 AM [name], MD [facility]  11/05/2017  9:20 AM Martinique, Peter M, MD [facility]  11/20/2017  9:00 AM [name], MD [facility]   Return precautions advised.  Garret Reddish, MD

## 2017-07-28 ENCOUNTER — Encounter: Payer: Self-pay | Admitting: Family Medicine

## 2017-08-01 ENCOUNTER — Other Ambulatory Visit: Payer: Self-pay | Admitting: Cardiology

## 2017-08-04 NOTE — Telephone Encounter (Signed)
Rx(s) sent to pharmacy electronically.  

## 2017-08-15 ENCOUNTER — Other Ambulatory Visit: Payer: Self-pay | Admitting: Cardiology

## 2017-09-18 DIAGNOSIS — H353221 Exudative age-related macular degeneration, left eye, with active choroidal neovascularization: Secondary | ICD-10-CM | POA: Diagnosis not present

## 2017-09-18 DIAGNOSIS — H353112 Nonexudative age-related macular degeneration, right eye, intermediate dry stage: Secondary | ICD-10-CM | POA: Diagnosis not present

## 2017-09-19 ENCOUNTER — Other Ambulatory Visit: Payer: Self-pay | Admitting: Family Medicine

## 2017-10-05 ENCOUNTER — Encounter: Payer: Medicare Other | Admitting: Family Medicine

## 2017-10-12 DIAGNOSIS — H43813 Vitreous degeneration, bilateral: Secondary | ICD-10-CM | POA: Diagnosis not present

## 2017-10-12 DIAGNOSIS — H35423 Microcystoid degeneration of retina, bilateral: Secondary | ICD-10-CM | POA: Diagnosis not present

## 2017-10-12 DIAGNOSIS — H35372 Puckering of macula, left eye: Secondary | ICD-10-CM | POA: Diagnosis not present

## 2017-10-12 DIAGNOSIS — H353131 Nonexudative age-related macular degeneration, bilateral, early dry stage: Secondary | ICD-10-CM | POA: Diagnosis not present

## 2017-10-24 ENCOUNTER — Other Ambulatory Visit: Payer: Self-pay | Admitting: Cardiology

## 2017-10-24 ENCOUNTER — Encounter: Payer: Self-pay | Admitting: Family Medicine

## 2017-10-26 NOTE — Telephone Encounter (Signed)
Rx sent to pharmacy   

## 2017-10-27 ENCOUNTER — Ambulatory Visit: Payer: Medicare Other | Admitting: Family Medicine

## 2017-10-27 ENCOUNTER — Encounter: Payer: Self-pay | Admitting: Family Medicine

## 2017-10-27 VITALS — BP 142/76 | HR 53 | Temp 97.8°F | Ht 72.0 in | Wt 216.2 lb

## 2017-10-27 DIAGNOSIS — D692 Other nonthrombocytopenic purpura: Secondary | ICD-10-CM | POA: Diagnosis not present

## 2017-10-27 DIAGNOSIS — Z79899 Other long term (current) drug therapy: Secondary | ICD-10-CM

## 2017-10-27 DIAGNOSIS — B351 Tinea unguium: Secondary | ICD-10-CM

## 2017-10-27 MED ORDER — TERBINAFINE HCL 250 MG PO TABS: 250.0000 mg | ORAL_TABLET | Freq: Every day | ORAL | 0 refills | Status: DC

## 2017-10-27 NOTE — Progress Notes (Signed)
Subjective:  Jose Ware is a 83 y.o. year old very pleasant male patient who presents for/with See problem oriented charting ROS- has thickened blackened toenails. No fever or chills. Feels well. Pain from prior shingles is greatly improved and no longer taking medicine. Some knee pain   Past Medical History-  Patient Active Problem List   Diagnosis Date Noted  . CAD S/P PCI     Priority: High  . Ischemic cardiomyopathy     Priority: High  . History of embolic stroke     Priority: High  . Primary hyperparathyroidism ([place]) 12/28/2013    Priority: High  . Fatigue 12/01/2013    Priority: High  . History of prostate cancer 12/14/2008    Priority: High  . Post herpetic neuralgia 06/20/2017    Priority: Medium  . Spinal stenosis of lumbar region at multiple levels 04/30/2017    Priority: Medium  . Dyslipidemia 11/16/2015    Priority: Medium  . History of adenomatous polyp of colon 01/18/2015    Priority: Medium  . PSORIASIS 10/15/2007    Priority: Medium  . Essential hypertension 11/27/2006    Priority: Medium  . Drug reaction 02/05/2017    Priority: Low  . Senile purpura ([place]) 02/05/2017    Priority: Low  . NSVT (nonsustained ventricular tachycardia) ([place]) 11/08/2015    Priority: Low  . History of stomach cancer 12/01/2013    Priority: Low  . Lower back pain 03/24/2013    Priority: Low  . Bradycardia 09/23/2012    Priority: Low  . Bruising 09/23/2012    Priority: Low  . B12 deficiency 09/17/2010    Priority: Low  . PERIPHERAL NEUROPATHY 10/18/2007    Priority: Low  . DEGENERATIVE JOINT DISEASE 11/27/2006    Priority: Low  . NEPHROLITHIASIS, HX OF 11/27/2006    Priority: Low  . Onychomycosis 10/28/2017  . Spondylosis 06/25/2017    Medications- reviewed and updated Current Outpatient Medications  Medication Sig Dispense Refill  . acetaminophen (TYLENOL) 325 MG tablet Take 650 mg by mouth as needed.    Marland Kitchen amLODipine (NORVASC) 5 MG tablet TAKE 1 TABLET BY  MOUTH ONCE DAILY 90 tablet 3  . aspirin EC 81 MG tablet Take 1 tablet (81 mg total) by mouth daily. 90 tablet 1  . atorvastatin (LIPITOR) 40 MG tablet Take 1 tablet (40 mg total) by mouth daily. 90 tablet 3  . beta carotene w/minerals (OCUVITE) tablet Take 1 tablet by mouth daily.    . carvedilol (COREG) 6.25 MG tablet Take 1 tablet (6.25 mg total) by mouth 2 (two) times daily with a meal. Keep OV 180 tablet 0  . clopidogrel (PLAVIX) 75 MG tablet TAKE 1 TABLET BY MOUTH ONCE DAILY 30 tablet 7  . Cyanocobalamin (VITAMIN B-12) 5000 MCG TBDP Take 5,000 mcg by mouth daily.    Marland Kitchen losartan (COZAAR) 100 MG tablet TAKE 1 TABLET BY MOUTH ONCE DAILY 90 tablet 3  . nitroGLYCERIN (NITROSTAT) 0.4 MG SL tablet Place 1 tablet (0.4 mg total) under the tongue every 5 (five) minutes x 3 doses as needed for chest pain. 25 tablet 2  . pantoprazole (PROTONIX) 40 MG tablet Take 40 mg by mouth as needed.    Marland Kitchen spironolactone (ALDACTONE) 25 MG tablet TAKE 1 TABLET BY MOUTH ONCE DAILY 90 tablet 0   Objective: BP (!) 142/76 (BP Location: Left Arm, Cuff Size: Normal)   Pulse (!) 53   Temp 97.8 F (36.6 C) (Oral)   Ht 6' (1.829 m)  Wt 216 lb 3.2 oz (98.1 kg)   SpO2 95%   BMI 29.32 kg/m  Gen: NAD, resting comfortably CV: RRR no murmurs rubs or gallops Lungs: CTAB no crackles, wheeze, rhonchi Ext: no edema Skin: warm, dry Toenails 2 and 5 on both feet thickened with yellow underneath. On toes 2 bilaterally also has reddish appearance to toenails with what appears to be dried blood underneath     Assessment/Plan:  Onychomycosis S: patient has discoloration in 2nd toe of bilateral feet noted in recent weeks. Toenails had been thickened on 2nd and 5th toe for sometime prior to that. Denies pain. At baseline he has very easy bruising bleeding particularly on plavix. Doesn't remember injury to foot but also doesn't remember injury that caused multiple bruises on bilateral foreamrs A/P: I suspect the raised the nail  on second toes bilaterally from onychomycosis is causing area to be higher risk for trauma and bleeding.  Looks like traumatic nail- doubt melanoma.  Plus bilateral nature makes melanoma less likely.  He would like to treat onychomycosis with 12 weeks of Lamisil oral.  He will come back in 6 weeks for LFT check.  He understands risks to the liver  Senile purpura [facility]) Patient again notes easy bruising and bleeding.  We discussed that this certainly is likely with his age and being on Plavix.  Discussed benign nature of this.   Future Appointments  Date Time Provider [place]  11/05/2017  9:20 AM Martinique, Peter M, MD [facility]  11/06/2017  9:30 AM [name], MD [facility]  11/20/2017  9:00 AM [name], MD [facility]  12/08/2017 11:00 AM [name], MD [facility]   Lab/Order associations: Onychomycosis  Senile purpura ([place])  High risk medication use - Plan: Hepatic function panel  Meds ordered this encounter  Medications  . terbinafine (LAMISIL) 250 MG tablet    Sig: Take 1 tablet (250 mg total) by mouth daily.    Dispense:  90 tablet    Refill:  0    Return precautions advised.  Garret Reddish, MD

## 2017-10-27 NOTE — Patient Instructions (Addendum)
Health Maintenance Due  Topic Date Due  . COLONOSCOPY-pt in the process of scheduling  08/29/2016  . INFLUENZA VACCINE -pt to call and schedule this in October-November  10/08/2017   Looks like toenail fungus on toes 2 and 5 on both feet. You also likely have had some slight trauma to the nails on 2nd toes because they are raised by the fungs- lets treat the fungus and hopefully this grows out in next 3-6 months. If the black portion persists despite this treatment may ultimately send you to dermatology but it really just looks like blood from trauma at this point  Schedule lab visit in 6 weeks for liver check since this medicine can affect the liver. We would stop if any changes at 6 weeks.

## 2017-10-28 DIAGNOSIS — B351 Tinea unguium: Secondary | ICD-10-CM | POA: Insufficient documentation

## 2017-10-28 NOTE — Assessment & Plan Note (Signed)
Patient again notes easy bruising and bleeding.  We discussed that this certainly is likely with his age and being on Plavix.  Discussed benign nature of this.

## 2017-10-28 NOTE — Assessment & Plan Note (Signed)
S: patient has discoloration in 2nd toe of bilateral feet noted in recent weeks. Toenails had been thickened on 2nd and 5th toe for sometime prior to that. Denies pain. At baseline he has very easy bruising bleeding particularly on plavix. Doesn't remember injury to foot but also doesn't remember injury that caused multiple bruises on bilateral foreamrs A/P: I suspect the raised the nail on second toes bilaterally from onychomycosis is causing area to be higher risk for trauma and bleeding.  Looks like traumatic nail- doubt melanoma.  Plus bilateral nature makes melanoma less likely.  He would like to treat onychomycosis with 12 weeks of Lamisil oral.  He will come back in 6 weeks for LFT check.  He understands risks to the liver

## 2017-11-01 NOTE — Progress Notes (Signed)
11/05/2017 Jose Ware   1934-07-29  [id number]  Primary Physician Jose Channel Brayton Mars, MD Primary Cardiologist: Dr Martinique  HPI:  [name] is seen for follow up CAD. He has a history of HTN, remote tobacco abuse, psoriasis, prostate cancer s/p XRT and radiation seed therapy, adenomatous colon polyps and diverticulitis s/p previous abdominal surgeries. He presented to [facility] on 11/04/15 as a inferolateral STEMI. Emergent cath revealed a very large LCx with extensive thrombus. He underwent PCI with DES. With reperfusion he had NSVT. The pt also had residual RCA and LAD disease and an EF of 25% at cath. Echo done 11/05/15 showed an EF of 40-45%. His Troponin peaked at 25. On 11/06/15 he was taken back to the lab for staged PCI to his LAD and RCA. This was successful but on 11/07/15 the pt related that he developed some word finding issues that started post PCI 8/29. MRI revealed scattered small acute infarcts in the bilateral anterior and posterior circulation.  CA dopplers revealed mild plaque (1-39%). The pt's symptoms were improving and no further therapy was recommended. He was treated for a Herpes Zoster infection earlier this spring.    On follow up today he reports he is doing very well. Denies any chest pain or SOB.   He is generally very active walking  and doing  yard work for hours. He does use a walking stick for balance. Bruises easily. Is due for colonoscopy and has a physical with primary care tomorrow.   Current Outpatient Medications  Medication Sig Dispense Refill  . acetaminophen (TYLENOL) 325 MG tablet Take 650 mg by mouth as needed.    Marland Kitchen aspirin EC 81 MG tablet Take 1 tablet (81 mg total) by mouth daily. 90 tablet 1  . atorvastatin (LIPITOR) 40 MG tablet Take 1 tablet (40 mg total) by mouth daily. 90 tablet 3  . beta carotene w/minerals (OCUVITE) tablet Take 1 tablet by mouth daily.    . carvedilol (COREG) 6.25 MG tablet Take 1 tablet (6.25 mg total) by mouth 2 (two) times  daily with a meal. Keep OV 180 tablet 0  . Cyanocobalamin (VITAMIN B-12) 5000 MCG TBDP Take 5,000 mcg by mouth daily.    Marland Kitchen losartan (COZAAR) 50 MG tablet Take 50 mg by mouth daily.    . nitroGLYCERIN (NITROSTAT) 0.4 MG SL tablet Place 1 tablet (0.4 mg total) under the tongue every 5 (five) minutes x 3 doses as needed for chest pain. 25 tablet 2  . pantoprazole (PROTONIX) 40 MG tablet Take 40 mg by mouth as needed.    Marland Kitchen spironolactone (ALDACTONE) 25 MG tablet TAKE 1 TABLET BY MOUTH ONCE DAILY 90 tablet 0  . terbinafine (LAMISIL) 250 MG tablet Take 1 tablet (250 mg total) by mouth daily. 90 tablet 0  . triamcinolone ointment (KENALOG) 0.5 % Apply 1 application topically 2 (two) times daily.     No current facility-administered medications for this visit.     Allergies  Allergen Reactions  . Bee Venom Swelling    Facial swelling  . Betadine [Povidone Iodine] Other (See Comments)    blistering  . Doxycycline     Possible drug rash- back of right lower leg and onto left lower leg as well    Social History   Socioeconomic History  . Marital status: Married    Spouse name: Not on file  . Number of children: 3  . Years of education: Not on file  . Highest education level: Not on  file  Occupational History  . Not on file  Social Needs  . Financial resource strain: Not on file  . Food insecurity:    Worry: Not on file    Inability: Not on file  . Transportation needs:    Medical: Not on file    Non-medical: Not on file  Tobacco Use  . Smoking status: Former Smoker    Packs/day: 2.00    Years: 37.00    Pack years: 74.00    Last attempt to quit: 03/11/1983    Years since quitting: 34.6  . Smokeless tobacco: Never Used  . Tobacco comment: started smoking in the service; ICU 85' part of his stomach; appendix; coma   Substance and Sexual Activity  . Alcohol use: Yes    Alcohol/week: 0.0 standard drinks    Comment: RARE  . Drug use: No  . Sexual activity: Not on file  Lifestyle    . Physical activity:    Days per week: Not on file    Minutes per session: Not on file  . Stress: Not on file  Relationships  . Social connections:    Talks on phone: Not on file    Gets together: Not on file    Attends religious service: Not on file    Active member of club or organization: Not on file    Attends meetings of clubs or organizations: Not on file    Relationship status: Not on file  . Intimate partner violence:    Fear of current or ex partner: Not on file    Emotionally abused: Not on file    Physically abused: Not on file    Forced sexual activity: Not on file  Other Topics Concern  . Not on file  Social History Narrative   [facility] [employer] [facility] [employer] [employer] [facility] [place], [place].   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  All other systems reviewed and are otherwise negative except as noted above.  Blood pressure (!) 96/50, pulse (!) 49, height 6' (1.829 m), weight 213 lb (96.6 kg).  GENERAL:  Well appearing WM in NAD HEENT:  PERRL, EOMI, sclera are clear. Oropharynx is clear. NECK:  No jugular venous distention, carotid upstroke brisk and symmetric, no bruits, no thyromegaly or adenopathy LUNGS:  Clear to auscultation bilaterally CHEST:  Unremarkable HEART:  RRR,  PMI not displaced or sustained,S1 and S2 within normal limits, no S3, no S4: no clicks, no rubs, no murmurs ABD:  Soft, nontender. BS +, no masses or bruits. No hepatomegaly, no splenomegaly EXT:  2 + pulses throughout, no edema, no cyanosis no clubbing SKIN:  Warm and dry.  No rashes NEURO:  Alert and oriented x 3. Cranial nerves II through XII intact. PSYCH:  Cognitively  intact       Laboratory data:  Lab Results  Component Value Date   WBC 9.6 05/07/2017   HGB 13.2 05/07/2017   HCT 39.3 05/07/2017   PLT 266.0 05/07/2017   GLUCOSE 82 05/07/2017   CHOL 67 05/07/2017   TRIG  59.0 05/07/2017   HDL 29.10 (L) 05/07/2017   LDLDIRECT 28.0 10/16/2016   LDLCALC 26 05/07/2017   ALT 11 05/07/2017   AST 13 05/07/2017   NA 139 05/07/2017   K 4.2 05/07/2017   CL 101 05/07/2017   CREATININE 1.06 05/07/2017   BUN 26 (H) 05/07/2017   CO2 32 05/07/2017   TSH 2.48 12/15/2014   PSA <0.015 01/22/2016   INR 1.08 11/04/2015   HGBA1C 5.3 11/04/2015   Ecg today shows NSR with old posterior infarct. I have personally reviewed and interpreted this study.   ASSESSMENT AND PLAN:  1. CAD s/p inferolateral STEMI in August 2017 with thrombotic occlusion of the LCx. S/p DES. Subsequent staged PCI of the proximal LAD and RCA. Asymptomatic. Continue ASA. He may discontinue taking Plavix now.  2. Ischemic LV dysfunction. Asymptomatic. On Coreg. Losartan, aldactone. No overt CHF. 3. 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 Resolved. 4. Hypertension. Well controlled now.  5. Hyperlipidemia. On high dose statin. Labs in February were excellent. Scheduled for repeat lab tomorrow.    PLAN   Follow up in 6 months.  Aundra Espin Martinique MD,FACC 11/05/2017 9:41 AM

## 2017-11-05 ENCOUNTER — Ambulatory Visit: Payer: Medicare Other | Admitting: Cardiology

## 2017-11-05 ENCOUNTER — Encounter: Payer: Self-pay | Admitting: Cardiology

## 2017-11-05 VITALS — BP 96/50 | HR 49 | Ht 72.0 in | Wt 213.0 lb

## 2017-11-05 DIAGNOSIS — Z9861 Coronary angioplasty status: Secondary | ICD-10-CM

## 2017-11-05 DIAGNOSIS — I2510 Atherosclerotic heart disease of native coronary artery without angina pectoris: Secondary | ICD-10-CM

## 2017-11-05 DIAGNOSIS — E785 Hyperlipidemia, unspecified: Secondary | ICD-10-CM

## 2017-11-05 DIAGNOSIS — I10 Essential (primary) hypertension: Secondary | ICD-10-CM | POA: Diagnosis not present

## 2017-11-05 DIAGNOSIS — I255 Ischemic cardiomyopathy: Secondary | ICD-10-CM

## 2017-11-05 NOTE — Patient Instructions (Signed)
You can stop taking Plavix  Continue your other medication  I will see you in 6 months.

## 2017-11-06 ENCOUNTER — Ambulatory Visit (INDEPENDENT_AMBULATORY_CARE_PROVIDER_SITE_OTHER): Payer: Medicare Other | Admitting: Family Medicine

## 2017-11-06 ENCOUNTER — Encounter: Payer: Self-pay | Admitting: Family Medicine

## 2017-11-06 VITALS — BP 110/58 | HR 47 | Temp 97.8°F | Ht 72.0 in | Wt 212.4 lb

## 2017-11-06 DIAGNOSIS — Z9861 Coronary angioplasty status: Secondary | ICD-10-CM

## 2017-11-06 DIAGNOSIS — Z0000 Encounter for general adult medical examination without abnormal findings: Secondary | ICD-10-CM

## 2017-11-06 DIAGNOSIS — I10 Essential (primary) hypertension: Secondary | ICD-10-CM

## 2017-11-06 DIAGNOSIS — E785 Hyperlipidemia, unspecified: Secondary | ICD-10-CM | POA: Diagnosis not present

## 2017-11-06 DIAGNOSIS — I2510 Atherosclerotic heart disease of native coronary artery without angina pectoris: Secondary | ICD-10-CM

## 2017-11-06 LAB — COMPREHENSIVE METABOLIC PANEL
ALBUMIN: 3.7 g/dL (ref 3.5–5.2)
ALK PHOS: 72 U/L (ref 39–117)
ALT: 9 U/L (ref 0–53)
AST: 11 U/L (ref 0–37)
BUN: 31 mg/dL — AB (ref 6–23)
CO2: 29 mEq/L (ref 19–32)
Calcium: 9.1 mg/dL (ref 8.4–10.5)
Chloride: 104 mEq/L (ref 96–112)
Creatinine, Ser: 1.19 mg/dL (ref 0.40–1.50)
GFR: 62.29 mL/min (ref >60.00)
Glucose, Bld: 117 mg/dL — ABNORMAL HIGH (ref 70–99)
POTASSIUM: 4.1 meq/L (ref 3.5–5.1)
SODIUM: 141 meq/L (ref 135–145)
TOTAL PROTEIN: 6.2 g/dL (ref 6.0–8.3)
Total Bilirubin: 1.2 mg/dL (ref 0.2–1.2)

## 2017-11-06 LAB — CBC
HEMATOCRIT: 39.1 % (ref 39.0–52.0)
Hemoglobin: 12.8 g/dL — ABNORMAL LOW (ref 13.0–17.0)
MCHC: 32.7 g/dL (ref 30.0–36.0)
MCV: 97 fl (ref 78.0–100.0)
PLATELETS: 243 10*3/uL (ref 150.0–400.0)
RBC: 4.03 Mil/uL — AB (ref 4.22–5.81)
RDW: 14.5 % (ref 11.5–15.5)
WBC: 7.8 10*3/uL (ref 4.0–10.5)

## 2017-11-06 LAB — LDL CHOLESTEROL, DIRECT: LDL DIRECT: 31 mg/dL

## 2017-11-06 NOTE — Progress Notes (Signed)
Phone: [phone]  Subjective:  Patient presents today for their annual physical. Chief complaint-noted.   See problem oriented charting- ROS- full  review of systems was completed and negative except for: joint and back pain   The following were reviewed and entered/updated in epic: Past Medical History:  Diagnosis Date  . Adenocarcinoma of prostate [facility])    XRT & Radiation seed implantation in 2010  . Adenomatous colon polyp   . CAD (coronary artery disease)    a. 11/04/15:  Acute inferolateral STEMI: S/p emergent DES of a very large LCx with extensive thrombus. Significant residual disease in the proximal LAD and distal RCA. S/p staged PCI of LAD and RCA 8/29.  Marland Kitchen CVA (cerebral infarction)    a. 03/4968: embolic CVA after heart cath   . CVA (cerebral infarction)    a. 04/6376: embolic CVA after heart cath   . DIVERTICULITIS, HX OF 11/27/2006       . DJD (degenerative joint disease)    wrist - R   . Hernia   . History of blood transfusion 1985   with colon surgery  . Hypertension   . Ischemic cardiomyopathy    a. EF 25-30% by LV gram on cath 11/04/15. Appeared out of proportion to infarct although infarct was large. EF improved by Echo and now 40-45%.  . NEPHROLITHIASIS, HX OF 11/27/2006        . Peripheral neuropathy   . Psoriasis   . Tenosynovitis    Patient Active Problem List   Diagnosis Date Noted  . CAD S/P PCI     Priority: High  . Ischemic cardiomyopathy     Priority: High  . History of embolic stroke     Priority: High  . Primary hyperparathyroidism ([place]) 12/28/2013    Priority: High  . Fatigue 12/01/2013    Priority: High  . History of prostate cancer 12/14/2008    Priority: High  . Post herpetic neuralgia 06/20/2017    Priority: Medium  . Spinal stenosis of lumbar region at multiple levels 04/30/2017    Priority: Medium  . Dyslipidemia 11/16/2015    Priority: Medium  . History of adenomatous polyp of colon 01/18/2015    Priority: Medium  . PSORIASIS  10/15/2007    Priority: Medium  . Essential hypertension 11/27/2006    Priority: Medium  . Drug reaction 02/05/2017    Priority: Low  . Senile purpura ([place]) 02/05/2017    Priority: Low  . NSVT (nonsustained ventricular tachycardia) ([place]) 11/08/2015    Priority: Low  . History of stomach cancer 12/01/2013    Priority: Low  . Lower back pain 03/24/2013    Priority: Low  . Bradycardia 09/23/2012    Priority: Low  . Bruising 09/23/2012    Priority: Low  . B12 deficiency 09/17/2010    Priority: Low  . PERIPHERAL NEUROPATHY 10/18/2007    Priority: Low  . DEGENERATIVE JOINT DISEASE 11/27/2006    Priority: Low  . NEPHROLITHIASIS, HX OF 11/27/2006    Priority: Low  . Onychomycosis 10/28/2017  . Spondylosis 06/25/2017   Past Surgical History:  Procedure Laterality Date  . APPENDECTOMY  1985  . BACK SURGERY  1980   minor disk surgery: instrumentation placed and removed in a second procedure  . CARDIAC CATHETERIZATION N/A 11/04/2015   Procedure: Left Heart Cath and Coronary Angiography;  Surgeon: Peter M Martinique, MD;  Location: [place] CV LAB;  Service: Cardiovascular;  Laterality: N/A;  . CARDIAC CATHETERIZATION N/A 11/04/2015   Procedure: Coronary Stent  Intervention;  Surgeon: Peter M Martinique, MD;  Location: [place] CV [facility];  Service: Cardiovascular;  Laterality: N/A;  . CARDIAC CATHETERIZATION N/A 11/06/2015   Procedure: Coronary Stent Intervention;  Surgeon: Leonie Man, MD;  Location: [place] CV LAB;  Service: Cardiovascular;  Laterality: N/A;  . CATARACT EXTRACTION, BILATERAL  2013  . CHOLECYSTECTOMY  1986  . COLON SURGERY  1980's   pt. had ileus, had colon surgery, requiring colostomy & then reversal & then dehisence of that wound & return to OR for repair & cholecystectomy    . COLONOSCOPY    . COLOSTOMY  1985   After colectomy for diverticulitis, pt. remarks during this surgery they "gave me the paddles two times  because of bleeding", pt. states it was unrelated  to any anesthesia complication  . EYE SURGERY     /w IOL  . KNEE ARTHROSCOPY Left 2003  . KNEE SURGERY  1956   Left 1956, Right w/cartilage removed later  . PARATHYROIDECTOMY N/A 05/12/2014   Procedure: PARATHYROIDECTOMY;  Surgeon: Armandina Gemma, MD;  Location: [place];  Service: General;  Laterality: N/A;  . REVERSAL OF COLOSTOMY  1987  . TONSILLECTOMY  1946  . WRIST SURGERY Left    Remote complicated w/infection    Family History  Problem Relation Age of Onset  . Coronary artery disease Mother   . Alcohol abuse Father   . Cirrhosis Father   . Heart failure Sister   . Breast cancer Sister        W/involvement of right arm leading to amputation  . Coronary artery disease Brother   . Heart attack Brother   . Clotting disorder Brother   . Prostate cancer Neg Hx   . Colon cancer Neg Hx   . Diabetes Neg Hx   . Glaucoma Neg Hx   . Rectal cancer Neg Hx   . Esophageal cancer Neg Hx     Medications- reviewed and updated Current Outpatient Medications  Medication Sig Dispense Refill  . acetaminophen (TYLENOL) 325 MG tablet Take 650 mg by mouth as needed.    Marland Kitchen aspirin EC 81 MG tablet Take 1 tablet (81 mg total) by mouth daily. 90 tablet 1  . atorvastatin (LIPITOR) 40 MG tablet Take 1 tablet (40 mg total) by mouth daily. 90 tablet 3  . beta carotene w/minerals (OCUVITE) tablet Take 1 tablet by mouth daily.    . carvedilol (COREG) 6.25 MG tablet Take 1 tablet (6.25 mg total) by mouth 2 (two) times daily with a meal. Keep OV 180 tablet 0  . Cyanocobalamin (VITAMIN B-12) 5000 MCG TBDP Take 5,000 mcg by mouth daily.    Marland Kitchen losartan (COZAAR) 50 MG tablet Take 50 mg by mouth daily.    . nitroGLYCERIN (NITROSTAT) 0.4 MG SL tablet Place 1 tablet (0.4 mg total) under the tongue every 5 (five) minutes x 3 doses as needed for chest pain. 25 tablet 2  . pantoprazole (PROTONIX) 40 MG tablet Take 40 mg by mouth as needed.    Marland Kitchen spironolactone (ALDACTONE) 25 MG tablet TAKE 1 TABLET BY MOUTH ONCE DAILY 90  tablet 0  . terbinafine (LAMISIL) 250 MG tablet Take 1 tablet (250 mg total) by mouth daily. 90 tablet 0  . triamcinolone ointment (KENALOG) 0.5 % Apply 1 application topically 2 (two) times daily.     No current facility-administered medications for this visit.     Allergies-reviewed and updated Allergies  Allergen Reactions  . Bee Venom Swelling  Facial swelling  . Betadine [Povidone Iodine] Other (See Comments)    blistering  . Doxycycline     Possible drug rash- back of right lower leg and onto left lower leg as well    Social History   Social History Narrative   [place].   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 Oropharynx normal Neck: no thyromegaly CV: bradycardic in 50s but regular,  no murmurs rubs or gallops Lungs: CTAB no crackles, wheeze, rhonchi Abdomen: soft/nontender/nondistended/normal bowel sounds. No rebound or guarding. Extensive scarring lower abdomen. Overweight  Ext: no edema Skin: warm, dry Neuro: grossly normal, moves all extremities, PERRLA  Assessment/Plan:  83 y.o. male presenting for annual physical.  Health Maintenance counseling: 1. Anticipatory guidance: Patient counseled regarding regular dental exams -q6 months, eye exams -yearly, wearing seatbelts.  2. Risk factor reduction:  Advised patient of need for regular exercise  and diet rich and fruits and vegetables to reduce risk of heart attack and stroke. Exercise- doing every other day right now, walking neighborhood. Diet- Down 6 lbs over last year, wife has tightened up diet some due to some issues with her stomach Wt Readings from Last 3 Encounters:  11/06/17 212 lb 6.4 oz (96.3 kg)  11/05/17 213 lb (96.6 kg)  10/27/17 216 lb 3.2 oz (98.1 kg)  3. Immunizations/screenings/ancillary studies- fall flu shot planned. On wait list for shingrix.  Immunization History  Administered Date(s) Administered  . Influenza Split 01/06/2012  . Influenza Whole 12/19/2008, 12/27/2009  . Influenza, High Dose Seasonal PF 12/21/2012, 12/22/2014, 02/05/2017  . Influenza,inj,Quad PF,6+ Mos 12/01/2013, 10/24/2015  . Pneumococcal Conjugate-13 09/14/2015  . Pneumococcal Polysaccharide-23 02/12/2009  . Td 03/12/2003  . Tdap 02/27/2014  4. Prostate cancer follow up -   Follows with urology saw recently- history radioactive seed implants. Has not had blood in urine recently plus has discussed with urology- so slight they didn't advise further follow up Lab Results  Component Value Date   PSA <0.015 01/22/2016   PSA 0.01 (L) 12/15/2014   PSA 0.39 12/14/2008   5. Colon cancer screening - 08/30/15 with plan 1 year follow up due to large polyp, got delayed due to heart issues. Now on schedule to sit down with them next month 6. Skin cancer screening- no dermatologist recently. advised regular sunscreen use. Denies worrisome, changing, or new skin lesions.   Status of chronic or acute concerns   Toenails improving slightly within last week- seems to be growing out- will come back for liver tests a few more weeks as well  Fatigue/hyperparthyroidism- s/p parathyroidectomy - off and on but still better than before parathyroid surgery. No worsening pattern  Psoriasis- recurrent spot on ankle but goes away  at times. Not currently active  Essential hypertension controlled on losartan  100mg , spironolactone 25mg , coreg 6.25mg  BID, amlodipine 5 mg.   Dyslipidemia HLD- controlled on atorvastatin 80mg - continue current meds. Update LDL only with lipids in February full testing__> LDL <70 today on labs  CAD S/P PCI CAD- follows with cardiology on statin and aspirin. Asymptomatic. STEMI aug 2017 with sgaged PCI- done well since then. He was taken off of plavix due to easy bruising/bleeding plus being over a year out from stent. May help with senile purpura   Future Appointments  Date Time Provider [place]  11/23/2017  8:45 AM Levin Erp, PA [facility]  12/08/2017  1:15 PM [name], MD [facility]   October visit needs to be lab only- contacted Carole Binning to update. Then needs to see me in 6 monts.  Lab/Order associations: Preventative health care - Plan: CBC, Comprehensive metabolic panel, LDL cholesterol, direct  Dyslipidemia - Plan: CBC, Comprehensive metabolic panel, LDL cholesterol, direct  Essential hypertension - Plan: CBC, Comprehensive metabolic panel, LDL cholesterol, direct  CAD S/P PCI  Return precautions advised.  Garret Reddish, MD

## 2017-11-06 NOTE — Patient Instructions (Addendum)
.   INFLUENZA VACCINE -please call our office to schedule this in October/November 10/08/2017   Please stop by lab before you go  Sorry for inconvenience- please reschedule a visit for early October for liver test

## 2017-11-07 NOTE — Assessment & Plan Note (Signed)
HLD- controlled on atorvastatin 80mg - continue current meds. Update LDL only with lipids in February full testing__> LDL <70 today on labs

## 2017-11-07 NOTE — Assessment & Plan Note (Signed)
controlled on losartan 100mg , spironolactone 25mg , coreg 6.25mg  BID, amlodipine 5 mg.

## 2017-11-07 NOTE — Assessment & Plan Note (Signed)
CAD- follows with cardiology on statin and aspirin. Asymptomatic. STEMI aug 2017 with sgaged PCI- done well since then. He was taken off of plavix due to easy bruising/bleeding plus being over a year out from stent. May help with senile purpura

## 2017-11-10 NOTE — Progress Notes (Signed)
Please contact patient to schedule 6 month f/u with Dr. Yong Channel

## 2017-11-20 ENCOUNTER — Ambulatory Visit: Payer: Medicare Other | Admitting: Family Medicine

## 2017-11-23 ENCOUNTER — Ambulatory Visit: Payer: Medicare Other | Admitting: Physician Assistant

## 2017-11-23 ENCOUNTER — Encounter: Payer: Self-pay | Admitting: Physician Assistant

## 2017-11-23 VITALS — BP 102/60 | HR 68 | Ht 72.0 in | Wt 217.5 lb

## 2017-11-23 DIAGNOSIS — Z86010 Personal history of colonic polyps: Secondary | ICD-10-CM | POA: Diagnosis not present

## 2017-11-23 NOTE — Progress Notes (Signed)
Chief Complaint: History of adenomatous polyps, chronic anticoagulation  HPI:    Jose Ware is an 83 year old male with a past medical history of STEMI in August 2017 underwent PCI with DES placement now on Plavix, CAD, CVA as well as ischemic cardiomyopathy,  known to Dr. Fuller Plan, who was referred to me by [name], MD for consideration of a colonoscopy.      Office visit 11/28/2016 to discuss repeat colonoscopy to assess completeness of the 2017 piecemeal polypectomy.  Last colonoscopy discussed from 08/2015 with one 15 mm polyp in the transverse colon removed piecemeal using a hot snare, 2 tattoos were noted in the transverse colon, one 6 mm polyp in the proximal transverse colon removed with cold snare, mild diverticulosis and internal hemorrhoids.  At that office visit patient was arranged for a colonoscopy and told to hold his Plavix for 5 days.    11/06/2015 patient underwent repeat cardiac catheterization with successful two-vessel staged PCI for complete revascularization post inferior lateral STEMI.  Echo at that time showed an LVEF 45-50%.    11/07/2017 PCP saw him.  Discussed that he was taken off of his Plavix due to easy bruising/bleeding plus being over a year out from his stent.    Today, the patient tells me that he knows he is due to have a repeat colonoscopy as this was scheduled previously but he underwent another heart intervention around that same time.  Patient would like to get this last colonoscopy done as long as his insurance will help him pay for it.      Does tell me that he has been somewhat fatigued lately but blames this on a recent episode of shingles from which he is still suffering from the "aftereffects".    Denies fever, chills, weight loss, change in bowel habits or abdominal pain.  Past Medical History:  Diagnosis Date  . Adenocarcinoma of prostate [facility])    XRT & Radiation seed implantation in 2010  . Adenomatous colon polyp   . CAD (coronary artery  disease)    a. 11/04/15:  Acute inferolateral STEMI: S/p emergent DES of a very large LCx with extensive thrombus. Significant residual disease in the proximal LAD and distal RCA. S/p staged PCI of LAD and RCA 8/29.  Marland Kitchen CVA (cerebral infarction)    a. 11/6220: embolic CVA after heart cath   . CVA (cerebral infarction)    a. 11/7987: embolic CVA after heart cath   . DIVERTICULITIS, HX OF 11/27/2006       . DJD (degenerative joint disease)    wrist - R   . Hernia   . History of blood transfusion 1985   with colon surgery  . Hypertension   . Ischemic cardiomyopathy    a. EF 25-30% by LV gram on cath 11/04/15. Appeared out of proportion to infarct although infarct was large. EF improved by Echo and now 40-45%.  . NEPHROLITHIASIS, HX OF 11/27/2006        . Peripheral neuropathy   . Psoriasis   . Tenosynovitis     Past Surgical History:  Procedure Laterality Date  . APPENDECTOMY  1985  . BACK SURGERY  1980   minor disk surgery: instrumentation placed and removed in a second procedure  . CARDIAC CATHETERIZATION N/A 11/04/2015   Procedure: Left Heart Cath and Coronary Angiography;  Surgeon: Peter M Martinique, MD;  Location: [place] [facility];  Service: Cardiovascular;  Laterality: N/A;  . CARDIAC CATHETERIZATION N/A 11/04/2015   Procedure: Coronary  Stent Intervention;  Surgeon: Peter M Martinique, MD;  Location: [place] [facility];  Service: Cardiovascular;  Laterality: N/A;  . CARDIAC CATHETERIZATION N/A 11/06/2015   Procedure: Coronary Stent Intervention;  Surgeon: Leonie Man, MD;  Location: [place] CV LAB;  Service: Cardiovascular;  Laterality: N/A;  . CATARACT EXTRACTION, BILATERAL  2013  . CHOLECYSTECTOMY  1986  . COLON SURGERY  1980's   pt. had ileus, had colon surgery, requiring colostomy & then reversal & then dehisence of that wound & return to OR for repair & cholecystectomy    . COLONOSCOPY    . COLOSTOMY  1985   After colectomy for diverticulitis, pt. remarks during this surgery  they "gave me the paddles two times  because of bleeding", pt. states it was unrelated to any anesthesia complication  . EYE SURGERY     /w IOL  . KNEE ARTHROSCOPY Left 2003  . KNEE SURGERY  1956   Left 1956, Right w/cartilage removed later  . PARATHYROIDECTOMY N/A 05/12/2014   Procedure: PARATHYROIDECTOMY;  Surgeon: Armandina Gemma, MD;  Location: ;  Service: General;  Laterality: N/A;  . REVERSAL OF COLOSTOMY  1987  . TONSILLECTOMY  1946  . WRIST SURGERY Left    Remote complicated w/infection    Current Outpatient Medications  Medication Sig Dispense Refill  . acetaminophen (TYLENOL) 325 MG tablet Take 650 mg by mouth as needed.    Marland Kitchen aspirin EC 81 MG tablet Take 1 tablet (81 mg total) by mouth daily. 90 tablet 1  . atorvastatin (LIPITOR) 40 MG tablet Take 1 tablet (40 mg total) by mouth daily. 90 tablet 3  . beta carotene w/minerals (OCUVITE) tablet Take 1 tablet by mouth daily.    . carvedilol (COREG) 6.25 MG tablet Take 1 tablet (6.25 mg total) by mouth 2 (two) times daily with a meal. Keep OV 180 tablet 0  . Cyanocobalamin (VITAMIN B-12) 5000 MCG TBDP Take 5,000 mcg by mouth daily.    Marland Kitchen losartan (COZAAR) 50 MG tablet Take 50 mg by mouth daily.    . nitroGLYCERIN (NITROSTAT) 0.4 MG SL tablet Place 1 tablet (0.4 mg total) under the tongue every 5 (five) minutes x 3 doses as needed for chest pain. 25 tablet 2  . pantoprazole (PROTONIX) 40 MG tablet Take 40 mg by mouth as needed.    Marland Kitchen spironolactone (ALDACTONE) 25 MG tablet TAKE 1 TABLET BY MOUTH ONCE DAILY 90 tablet 0  . terbinafine (LAMISIL) 250 MG tablet Take 1 tablet (250 mg total) by mouth daily. 90 tablet 0  . triamcinolone ointment (KENALOG) 0.5 % Apply 1 application topically 2 (two) times daily.     No current facility-administered medications for this visit.     Allergies as of 11/23/2017 - Review Complete 11/23/2017  Allergen Reaction Noted  . Bee venom Swelling 11/04/2015  . Betadine [povidone iodine] Other (See  Comments) 09/27/2011  . Doxycycline  02/05/2017    Family History  Problem Relation Age of Onset  . Coronary artery disease Mother   . Alcohol abuse Father   . Cirrhosis Father   . Heart failure Sister   . Breast cancer Sister        W/involvement of right arm leading to amputation  . Coronary artery disease Brother   . Heart attack Brother   . Clotting disorder Brother   . Prostate cancer Neg Hx   . Colon cancer Neg Hx   . Diabetes Neg Hx   . Glaucoma Neg Hx   .  Rectal cancer Neg Hx   . Esophageal cancer Neg Hx     Social History   Socioeconomic History  . Marital status: Married    Spouse name: Not on file  . Number of children: 3  . Years of education: Not on file  . Highest education level: Not on file  Occupational History  . Not on file  Social Needs  . Financial resource strain: Not on file  . Food insecurity:    Worry: Not on file    Inability: Not on file  . Transportation needs:    Medical: Not on file    Non-medical: Not on file  Tobacco Use  . Smoking status: Former Smoker    Packs/day: 2.00    Years: 37.00    Pack years: 74.00    Last attempt to quit: 03/11/1983    Years since quitting: 34.7  . Smokeless tobacco: Never Used  . Tobacco comment: started smoking in the service; ICU 85' part of his stomach; appendix; coma   Substance and Sexual Activity  . Alcohol use: Yes    Alcohol/week: 0.0 standard drinks    Comment: RARE  . Drug use: No  . Sexual activity: Not on file  Lifestyle  . Physical activity:    Days per week: Not on file    Minutes per session: Not on file  . Stress: Not on file  Relationships  . Social connections:    Talks on phone: Not on file    Gets together: Not on file    Attends religious service: Not on file    Active member of club or organization: Not on file    Attends meetings of clubs or organizations: Not on file    Relationship status: Not on file  . Intimate partner violence:    Fear of current or ex partner:  Not on file    Emotionally abused: Not on file    Physically abused: Not on file    Forced sexual activity: Not on file  Other Topics Concern  . Not on file  Social History Narrative   [employer] [place], [place].   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 Lungs clear to auscultation bilaterally.   No wheezes, crackles, or rhonchi.  Cardiovascular: Normal S1, S2. No MRG. Regular rate and rhythm. No peripheral edema, cyanosis or pallor.  Gastrointestinal:  Soft, nondistended, nontender. No rebound or guarding. Normal bowel sounds. No appreciable masses or hepatomegaly. Psychiatric: Demonstrates good judgement and reason without abnormal affect or behaviors.  MOST RECENT LABS AND IMAGING: CBC  Component Value Date/Time   WBC 7.8 11/06/2017 1009   RBC 4.03 (L) 11/06/2017 1009   HGB 12.8 (L) 11/06/2017 1009   HCT 39.1 11/06/2017 1009   PLT 243.0 11/06/2017 1009   MCV 97.0 11/06/2017 1009   MCH 32.5 02/20/2016 0907   MCHC 32.7 11/06/2017 1009   RDW 14.5 11/06/2017 1009   LYMPHSABS 1.0 10/16/2016 0726   MONOABS 0.9 10/16/2016 0726   EOSABS 0.2 10/16/2016 0726   BASOSABS 0.1 10/16/2016 0726    CMP       Component Value Date/Time   NA 141 11/06/2017 1009   K 4.1 11/06/2017 1009   CL 104 11/06/2017 1009   CO2 29 11/06/2017 1009   GLUCOSE 117 (H) 11/06/2017 1009   BUN 31 (H) 11/06/2017 1009   CREATININE 1.19 11/06/2017 1009   CREATININE 1.03 02/20/2016 0907   CALCIUM 9.1 11/06/2017 1009   PROT 6.2 11/06/2017 1009   ALBUMIN 3.7 11/06/2017 1009   AST 11 11/06/2017 1009   ALT 9 11/06/2017 1009   ALKPHOS 72 11/06/2017 1009   BILITOT 1.2 11/06/2017 1009   GFRNONAA >60 11/07/2015 0322   GFRAA >60 11/07/2015 0322    Assessment: 1.  History of tubular adenomas: Last colonoscopy 08/2015 with piecemeal removal 15 mm adenomatous polyp, repeat colonoscopy is recommended in 1 year but patient underwent cardiac intervention around that time, now doing fine, taken off of his Plavix over the past week as he has been on this year status post stent and was having excess bruising, now doing well  Plan: 1.  Recommend repeat colonoscopy with Dr. Fuller Plan in [facility] for surveillance of piecemeal polypectomy site.  Did discuss risk, benefits, limitations and alternatives and the patient agrees to proceed. 2.  Patient to follow in clinic per recommendations from Dr. Fuller Plan after time of procedure.  Ellouise Newer, PA-C [facility] 11/23/2017, 8:42 AM  Cc: [name], MD

## 2017-11-23 NOTE — Patient Instructions (Signed)
It has been recommended to you by your physician that you have a(n) colonoscopy completed. Per your request, we did not schedule the procedure(s) today. Please contact our office at [phone] should you decide to have the procedure completed. 

## 2017-11-23 NOTE — Progress Notes (Signed)
Reviewed and agree with management plan.  Dezaree Tracey T. Amani Nodarse, MD FACG 

## 2017-12-04 ENCOUNTER — Encounter: Payer: Self-pay | Admitting: Gastroenterology

## 2017-12-04 ENCOUNTER — Other Ambulatory Visit: Payer: Self-pay | Admitting: Emergency Medicine

## 2017-12-04 DIAGNOSIS — Z86010 Personal history of colonic polyps: Secondary | ICD-10-CM

## 2017-12-04 MED ORDER — NA SULFATE-K SULFATE-MG SULF 17.5-3.13-1.6 GM/177ML PO SOLN: 1.0000 | ORAL | 0 refills | Status: DC

## 2017-12-08 ENCOUNTER — Ambulatory Visit: Payer: Medicare Other | Admitting: Family Medicine

## 2017-12-08 ENCOUNTER — Other Ambulatory Visit (INDEPENDENT_AMBULATORY_CARE_PROVIDER_SITE_OTHER): Payer: Medicare Other

## 2017-12-08 ENCOUNTER — Ambulatory Visit (INDEPENDENT_AMBULATORY_CARE_PROVIDER_SITE_OTHER): Payer: Medicare Other | Admitting: *Deleted

## 2017-12-08 DIAGNOSIS — Z23 Encounter for immunization: Secondary | ICD-10-CM | POA: Diagnosis not present

## 2017-12-08 DIAGNOSIS — Z79899 Other long term (current) drug therapy: Secondary | ICD-10-CM | POA: Diagnosis not present

## 2017-12-08 LAB — HEPATIC FUNCTION PANEL
ALK PHOS: 72 U/L (ref 39–117)
ALT: 12 U/L (ref 0–53)
AST: 12 U/L (ref 0–37)
Albumin: 3.6 g/dL (ref 3.5–5.2)
BILIRUBIN TOTAL: 0.9 mg/dL (ref 0.2–1.2)
Bilirubin, Direct: 0.2 mg/dL (ref 0.0–0.3)
Total Protein: 6.3 g/dL (ref 6.0–8.3)

## 2017-12-15 ENCOUNTER — Ambulatory Visit (AMBULATORY_SURGERY_CENTER): Payer: Medicare Other | Admitting: Gastroenterology

## 2017-12-15 ENCOUNTER — Encounter: Payer: Self-pay | Admitting: Gastroenterology

## 2017-12-15 VITALS — BP 136/74 | HR 50 | Temp 96.6°F | Resp 12 | Ht 72.0 in | Wt 217.0 lb

## 2017-12-15 DIAGNOSIS — D123 Benign neoplasm of transverse colon: Secondary | ICD-10-CM

## 2017-12-15 DIAGNOSIS — Z86010 Personal history of colonic polyps: Secondary | ICD-10-CM | POA: Diagnosis not present

## 2017-12-15 DIAGNOSIS — D120 Benign neoplasm of cecum: Secondary | ICD-10-CM

## 2017-12-15 DIAGNOSIS — D122 Benign neoplasm of ascending colon: Secondary | ICD-10-CM

## 2017-12-15 DIAGNOSIS — Z8673 Personal history of transient ischemic attack (TIA), and cerebral infarction without residual deficits: Secondary | ICD-10-CM | POA: Diagnosis not present

## 2017-12-15 MED ORDER — SODIUM CHLORIDE 0.9 % IV SOLN: 500.0000 mL | Freq: Once | INTRAVENOUS | Status: DC

## 2017-12-15 NOTE — Patient Instructions (Addendum)
YOU HAD AN ENDOSCOPIC PROCEDURE TODAY AT [facility]:   Refer to the procedure report that was given to you for any specific questions about what was found during the examination.  If the procedure report does not answer your questions, please call your gastroenterologist to clarify.  If you requested that your care partner not be given the details of your procedure findings, then the procedure report has been included in a sealed envelope for you to review at your convenience later.  YOU SHOULD EXPECT: Some feelings of bloating in the abdomen. Passage of more gas than usual.  Walking can help get rid of the air that was put into your GI tract during the procedure and reduce the bloating. If you had a lower endoscopy (such as a colonoscopy or flexible sigmoidoscopy) you may notice spotting of blood in your stool or on the toilet paper. If you underwent a bowel prep for your procedure, you may not have a normal bowel movement for a few days.  Please Note:  You might notice some irritation and congestion in your nose or some drainage.  This is from the oxygen used during your procedure.  There is no need for concern and it should clear up in a day or so.  SYMPTOMS TO REPORT IMMEDIATELY:   Following lower endoscopy (colonoscopy or flexible sigmoidoscopy):  Excessive amounts of blood in the stool  Significant tenderness or worsening of abdominal pains  Swelling of the abdomen that is new, acute  Fever of 100F or higher  Please see handouts given to you on Polyps, Diverticulosis and Hemorrhoids and High Fiber Diet.  No NSAIDS, Ibuprofen, aspirin products for 2 weeks. Tylenol is ok.  For urgent or emergent issues, a gastroenterologist can be reached at any hour by calling [phone].   DIET:  We do recommend a small meal at first, but then you may proceed to your regular diet.  Drink plenty of fluids but you should avoid alcoholic beverages for 24 hours.  ACTIVITY:  You should  plan to take it easy for the rest of today and you should NOT DRIVE or use heavy machinery until tomorrow (because of the sedation medicines used during the test).    FOLLOW UP: Our staff will call the number listed on your records the next business day following your procedure to check on you and address any questions or concerns that you may have regarding the information given to you following your procedure. If we do not reach you, we will leave a message.  However, if you are feeling well and you are not experiencing any problems, there is no need to return our call.  We will assume that you have returned to your regular daily activities without incident.  If any biopsies were taken you will be contacted by phone or by letter within the next 1-3 weeks.  Please call us at [phone] if you have not heard about the biopsies in 3 weeks.    SIGNATURES/CONFIDENTIALITY: You and/or your care partner have signed paperwork which will be entered into your electronic medical record.  These signatures attest to the fact that that the information above on your After Visit Summary has been reviewed and is understood.  Full responsibility of the confidentiality of this discharge information lies with you and/or your care-partner.  Thank you for letting us take care of your healthcare needs today.

## 2017-12-15 NOTE — Progress Notes (Signed)
Called to room to assist during endoscopic procedure.  Patient ID and intended procedure confirmed with present staff. Received instructions for my participation in the procedure from the performing physician.  

## 2017-12-15 NOTE — Op Note (Signed)
[facility] Patient Name: Jose Ware Procedure Date: 12/15/2017 1:18 PM MRN: [id number] Endoscopist: Ladene Artist , MD Age: 83 Referring MD:  Date of Birth: [birth year] Gender: Male Account #: [id number] Procedure:                Colonoscopy Indications:              Surveillance: History of piecemeal removal adenoma                            on last colonoscopy (< 3 yrs) Medicines:                Monitored Anesthesia Care Procedure:                Pre-Anesthesia Assessment:                           - Prior to the procedure, a History and Physical                            was performed, and patient medications and                            allergies were reviewed. The patient's tolerance of                            previous anesthesia was also reviewed. The risks                            and benefits of the procedure and the sedation                            options and risks were discussed with the patient.                            All questions were answered, and informed consent                            was obtained. Prior Anticoagulants: The patient has                            taken no previous anticoagulant or antiplatelet                            agents. ASA Grade Assessment: III - A patient with                            severe systemic disease. After reviewing the risks                            and benefits, the patient was deemed in                            satisfactory condition to undergo the procedure.  After obtaining informed consent, the colonoscope                            was passed under direct vision. Throughout the                            procedure, the patient's blood pressure, pulse, and                            oxygen saturations were monitored continuously. The                            Model [id number]-[id number]) scope was introduced                            through the anus and  advanced to the the cecum,                            identified by appendiceal orifice and ileocecal                            valve. The ileocecal valve, appendiceal orifice,                            and rectum were photographed. The quality of the                            bowel preparation was adequate after extensive                            lavage and suctioning. The colonoscopy was                            performed without difficulty. The patient tolerated                            the procedure well. Scope In: 1:34:15 PM Scope Out: 2:05:56 PM Scope Withdrawal Time: 0 hours 29 minutes 34 seconds  Total Procedure Duration: 0 hours 31 minutes 41 seconds  Findings:                 The perianal and digital rectal examinations were                            normal.                           Two tattoos were seen in the transverse colon.                           A 16 mm polyp was found in the transverse colon in                            between the tattoos. Prior piecemeal polypectomy  site. The polyp was sessile. The polyp was removed                            with a cold snare. Resection and retrieval were                            complete.                           Five sessile polyps were found in the transverse                            colon (2), ascending colon (2) and cecum (1). The                            polyps were 6 to 9 mm in size. These polyps were                            removed with a cold snare. Resection and retrieval                            were complete.                           A 4 mm polyp was found in the ascending colon. The                            polyp was sessile. The polyp was removed with a                            cold biopsy forceps. Resection and retrieval were                            complete.                           Two small localized angiodysplastic lesions were                             found in the cecum.                           Multiple medium-mouthed diverticula were found in                            the left colon. There was no evidence of                            diverticular bleeding.                           Internal hemorrhoids were found during                            retroflexion. The hemorrhoids  were small and Grade                            I (internal hemorrhoids that do not prolapse).                           The exam was otherwise without abnormality on                            direct and retroflexion views. Complications:            No immediate complications. Estimated blood loss:                            None. Estimated Blood Loss:     Estimated blood loss: none. Impression:               - A tattoo was seen in the transverse colon.                           - One 16 mm polyp in the transverse colon, removed                            with a cold snare. Resected and retrieved.                           - Five 6 to 9 mm polyps in the transverse colon, in                            the ascending colon and in the cecum, removed with                            a cold snare. Resected and retrieved.                           - One 4 mm polyp in the ascending colon, removed                            with a cold biopsy forceps. Resected and retrieved.                           - Two small cecal angiodysplasias.                           - Left colon diverticulosis.                           - Internal hemorrhoids.                           - The examination was otherwise normal on direct                            and retroflexion views. Recommendation:           - Repeat colonoscopy in  6 months for surveillance                            after piecemeal polypectomy with a more extensive                            bowel prep.                           - Patient has a contact number available for                            emergencies. The  signs and symptoms of potential                            delayed complications were discussed with the                            patient. Return to normal activities tomorrow.                            Written discharge instructions were provided to the                            patient.                           - High fiber diet.                           - Continue present medications.                           - Await pathology results.                           - No aspirin, ibuprofen, naproxen, or other                            non-steroidal anti-inflammatory drugs for 2 weeks                            after polyp removal. Ladene Artist, MD 12/15/2017 2:16:50 PM This report has been signed electronically.

## 2017-12-16 ENCOUNTER — Telehealth: Payer: Self-pay | Admitting: *Deleted

## 2017-12-16 ENCOUNTER — Telehealth: Payer: Self-pay

## 2017-12-16 NOTE — Telephone Encounter (Signed)
  Follow up Call-  Call back number 12/15/2017 08/28/2015  Post procedure Call Back phone  # [phone]  Permission to leave phone message Yes Yes  Some recent data might be hidden     Patient questions:  Do you have a fever, pain , or abdominal swelling? No. Pain Score  0 *  Have you tolerated food without any problems? Yes.    Have you been able to return to your normal activities? Yes.    Do you have any questions about your discharge instructions: Diet   No. Medications  No. Follow up visit  No.  Do you have questions or concerns about your Care? No.  Actions: * If pain score is 4 or above: No action needed, pain <4.

## 2017-12-16 NOTE — Telephone Encounter (Signed)
NO ANSWER, MESSAGE LEFT FOR PATIENT. 

## 2018-01-04 ENCOUNTER — Encounter: Payer: Self-pay | Admitting: Gastroenterology

## 2018-01-16 ENCOUNTER — Other Ambulatory Visit: Payer: Self-pay | Admitting: Cardiology

## 2018-01-18 NOTE — Telephone Encounter (Signed)
Rx(s) sent to pharmacy electronically.  

## 2018-01-23 ENCOUNTER — Other Ambulatory Visit: Payer: Self-pay | Admitting: Cardiology

## 2018-04-21 ENCOUNTER — Encounter: Payer: Self-pay | Admitting: Cardiology

## 2018-04-24 ENCOUNTER — Other Ambulatory Visit: Payer: Self-pay | Admitting: Family Medicine

## 2018-04-24 NOTE — Telephone Encounter (Signed)
Last OV 11/06/2017 Last refill 05/12/2017 #90/3 Next OV 05/11/2018

## 2018-05-03 DIAGNOSIS — H35372 Puckering of macula, left eye: Secondary | ICD-10-CM | POA: Diagnosis not present

## 2018-05-03 DIAGNOSIS — H353131 Nonexudative age-related macular degeneration, bilateral, early dry stage: Secondary | ICD-10-CM | POA: Diagnosis not present

## 2018-05-03 DIAGNOSIS — H43813 Vitreous degeneration, bilateral: Secondary | ICD-10-CM | POA: Diagnosis not present

## 2018-05-03 DIAGNOSIS — H35423 Microcystoid degeneration of retina, bilateral: Secondary | ICD-10-CM | POA: Diagnosis not present

## 2018-05-03 NOTE — Progress Notes (Signed)
05/06/2018 OWAIS PRUETT   Dec 26, 1934  [id number]  Primary Physician Yong Channel Brayton Mars, MD Primary Cardiologist: Dr Martinique  HPI:  Mr. Hofman is seen for follow up CAD. He has a history of HTN, remote tobacco abuse, psoriasis, prostate cancer s/p XRT and radiation seed therapy, adenomatous colon polyps and diverticulitis s/p previous abdominal surgeries. He presented to [facility] on 11/04/15 as a inferolateral STEMI. Emergent cath revealed a very large LCx with extensive thrombus. He underwent PCI with DES. With reperfusion he had NSVT. The pt also had residual RCA and LAD disease and an EF of 25% at cath. Echo done 11/05/15 showed an EF of 40-45%. His Troponin peaked at 25. On 11/06/15 he was taken back to the lab for staged PCI to his LAD and RCA. This was successful but on 11/07/15 the pt related that he developed some word finding issues that started post PCI 8/29. MRI revealed scattered small acute infarcts in the bilateral anterior and posterior circulation.  CA dopplers revealed mild plaque (1-39%). The pt's symptoms were improving and no further therapy was recommended. He was treated for a Herpes Zoster infection earlier this spring.    On follow up today he reports that he has been really fatigued over the past month. Denies any chest pain, dyspnea, palpitations, dizziness. No recent infections. No bowel complaints. Is having pain and instability in his knee. This has restricted his walking and he is using a cane. No edema.   Current Outpatient Medications  Medication Sig Dispense Refill  . acetaminophen (TYLENOL) 325 MG tablet Take 650 mg by mouth as needed.    Marland Kitchen AMLODIPINE BESYLATE PO 5 mg by Per post-pyloric tube route daily.     Marland Kitchen aspirin EC 81 MG tablet Take 1 tablet (81 mg total) by mouth daily. 90 tablet 1  . atorvastatin (LIPITOR) 40 MG tablet TAKE 1 TABLET BY MOUTH ONCE DAILY 90 tablet 1  . beta carotene w/minerals (OCUVITE) tablet Take 1 tablet by mouth daily.    . carvedilol  (COREG) 6.25 MG tablet TAKE 1 TABLET BY MOUTH TWICE DAILY WITH MEALS .  KEEP  OFFICE  VISIT 180 tablet 2  . Cyanocobalamin (VITAMIN B-12) 5000 MCG TBDP Take 5,000 mcg by mouth daily.    Marland Kitchen losartan (COZAAR) 50 MG tablet Take 50 mg by mouth daily.    . nitroGLYCERIN (NITROSTAT) 0.4 MG SL tablet Place 1 tablet (0.4 mg total) under the tongue every 5 (five) minutes x 3 doses as needed for chest pain. 25 tablet 2  . spironolactone (ALDACTONE) 25 MG tablet TAKE 1 TABLET BY MOUTH ONCE DAILY 90 tablet 3  . triamcinolone ointment (KENALOG) 0.5 % Apply 1 application topically 2 (two) times daily.     No current facility-administered medications for this visit.     Allergies  Allergen Reactions  . Bee Venom Swelling    Facial swelling  . Betadine [Povidone Iodine] Other (See Comments)    blistering  . Doxycycline     Possible drug rash- back of right lower leg and onto left lower leg as well    Social History   Socioeconomic History  . Marital status: Married    Spouse name: Not on file  . Number of children: 3  . Years of education: Not on file  . Highest education level: Not on file  Occupational History  . Not on file  Social Needs  . Financial resource strain: Not on file  . Food insecurity:  Worry: Not on file    Inability: Not on file  . Transportation needs:    Medical: Not on file    Non-medical: Not on file  Tobacco Use  . Smoking status: Former Smoker    Packs/day: 2.00    Years: 37.00    Pack years: 74.00    Last attempt to quit: 03/11/1983    Years since quitting: 35.1  . Smokeless tobacco: Never Used  . Tobacco comment: started smoking in the service; ICU 85' part of his stomach; appendix; coma   Substance and Sexual Activity  . Alcohol use: Yes    Alcohol/week: 0.0 standard drinks    Comment: RARE  . Drug use: No  . Sexual activity: Not on file  Lifestyle  . Physical activity:    Days per week: Not on file    Minutes per session: Not on file  . Stress: Not  on file  Relationships  . Social connections:    Talks on phone: Not on file    Gets together: Not on file    Attends religious service: Not on file    Active member of club or organization: Not on file    Attends meetings of clubs or organizations: Not on file    Relationship status: Not on file  . Intimate partner violence:    Fear of current or ex partner: Not on file    Emotionally abused: Not on file    Physically abused: Not on file    Forced sexual activity: Not on file  Other Topics Concern  . Not on file  Social History Narrative   [employer] [place], [place].   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  All other systems reviewed and are otherwise negative except as noted above.  Blood pressure 112/66, pulse 92, height 6\' 1"  (1.854 m), weight 217 lb 9.6 oz (98.7 kg). I get a pulse of 60.  GENERAL:  Well appearing WM in NAD HEENT:  PERRL, EOMI, sclera are clear. Oropharynx is clear. NECK:  No jugular venous distention, carotid upstroke brisk and symmetric, no bruits, no thyromegaly or adenopathy LUNGS:  Clear to auscultation bilaterally CHEST:  Unremarkable HEART:  RRR,  PMI not displaced or sustained,S1 and S2 within normal limits, no S3, no S4: no clicks, no rubs, no murmurs ABD:  Soft, nontender. BS +, no masses or bruits. No hepatomegaly, no splenomegaly EXT:  2 + pulses throughout, no edema, no cyanosis no clubbing SKIN:  Warm and dry.  No rashes NEURO:  Alert and oriented x 3. Cranial nerves II through XII intact. PSYCH:  Cognitively intact       Laboratory data:  Lab Results  Component Value Date   WBC 7.8  11/06/2017   HGB 12.8 (L) 11/06/2017   HCT 39.1 11/06/2017   PLT 243.0 11/06/2017   GLUCOSE 117 (H) 11/06/2017   CHOL 67 05/07/2017   TRIG 59.0 05/07/2017   HDL 29.10 (L) 05/07/2017   LDLDIRECT 31.0 11/06/2017   LDLCALC 26 05/07/2017  ALT 12 12/08/2017   AST 12 12/08/2017   NA 141 11/06/2017   K 4.1 11/06/2017   CL 104 11/06/2017   CREATININE 1.19 11/06/2017   BUN 31 (H) 11/06/2017   CO2 29 11/06/2017   TSH 2.48 12/15/2014   PSA <0.015 01/22/2016   INR 1.08 11/04/2015   HGBA1C 5.3 11/04/2015     ASSESSMENT AND PLAN:  1. CAD s/p inferolateral STEMI in August 2017 with thrombotic occlusion of the LCx. S/p DES. Subsequent staged PCI of the proximal LAD and RCA. He has no anginal symptoms. Continue ASA.  2. Ischemic LV dysfunction. Asymptomatic. On Coreg. Losartan, aldactone. No evidence of  CHF. 3. 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 Resolved. 4. Hypertension. Well controlled now.  5. Hyperlipidemia. On high dose statin.  6. Fatigue. Etiology not clear. Will check CBC, chemistries, and TSH.    PLAN   Follow up in 6 months.  Artin Mceuen Martinique MD,FACC 05/06/2018 10:52 AM

## 2018-05-06 ENCOUNTER — Encounter: Payer: Self-pay | Admitting: Family Medicine

## 2018-05-06 ENCOUNTER — Ambulatory Visit: Payer: Medicare Other | Admitting: Cardiology

## 2018-05-06 ENCOUNTER — Encounter: Payer: Self-pay | Admitting: Cardiology

## 2018-05-06 ENCOUNTER — Other Ambulatory Visit: Payer: Self-pay | Admitting: Cardiology

## 2018-05-06 VITALS — BP 112/66 | HR 92 | Ht 73.0 in | Wt 217.6 lb

## 2018-05-06 DIAGNOSIS — Z7689 Persons encountering health services in other specified circumstances: Secondary | ICD-10-CM | POA: Diagnosis not present

## 2018-05-06 DIAGNOSIS — Z9861 Coronary angioplasty status: Secondary | ICD-10-CM | POA: Diagnosis not present

## 2018-05-06 DIAGNOSIS — I10 Essential (primary) hypertension: Secondary | ICD-10-CM | POA: Diagnosis not present

## 2018-05-06 DIAGNOSIS — R5383 Other fatigue: Secondary | ICD-10-CM

## 2018-05-06 DIAGNOSIS — I2510 Atherosclerotic heart disease of native coronary artery without angina pectoris: Secondary | ICD-10-CM | POA: Diagnosis not present

## 2018-05-06 DIAGNOSIS — I255 Ischemic cardiomyopathy: Secondary | ICD-10-CM

## 2018-05-06 DIAGNOSIS — E785 Hyperlipidemia, unspecified: Secondary | ICD-10-CM | POA: Diagnosis not present

## 2018-05-06 LAB — LIPID PANEL

## 2018-05-06 NOTE — Patient Instructions (Signed)
Continue your current therapy  We will check blood work today

## 2018-05-07 LAB — CBC WITH DIFFERENTIAL/PLATELET
BASOS ABS: 0 10*3/uL (ref 0.0–0.2)
Basos: 0 %
EOS (ABSOLUTE): 0.2 10*3/uL (ref 0.0–0.4)
Eos: 3 %
Hematocrit: 38.7 % (ref 37.5–51.0)
Hemoglobin: 13.5 g/dL (ref 13.0–17.7)
LYMPHS ABS: 1 10*3/uL (ref 0.7–3.1)
Lymphs: 12 %
MCH: 32.1 pg (ref 26.6–33.0)
MCHC: 34.9 g/dL (ref 31.5–35.7)
MCV: 92 fL (ref 79–97)
MONOCYTES: 13 %
Monocytes Absolute: 1 10*3/uL — ABNORMAL HIGH (ref 0.1–0.9)
Neutrophils Absolute: 5.7 10*3/uL (ref 1.4–7.0)
Neutrophils: 72 %
PLATELETS: 259 10*3/uL (ref 150–450)
RBC: 4.2 x10E6/uL (ref 4.14–5.80)
RDW: 14.8 % (ref 11.6–15.4)
WBC: 8 10*3/uL (ref 3.4–10.8)

## 2018-05-07 LAB — TSH: TSH: 3.24 u[IU]/mL (ref 0.450–4.500)

## 2018-05-07 LAB — BASIC METABOLIC PANEL
BUN/Creatinine Ratio: 26 — ABNORMAL HIGH (ref 10–24)
BUN: 27 mg/dL (ref 8–27)
CALCIUM: 8.9 mg/dL (ref 8.6–10.2)
CO2: 28 mmol/L (ref 20–29)
Chloride: 103 mmol/L (ref 96–106)
Creatinine, Ser: 1.04 mg/dL (ref 0.76–1.27)
GFR calc Af Amer: 77 mL/min/{1.73_m2} (ref >59)
GFR, EST NON AFRICAN AMERICAN: 67 mL/min/{1.73_m2} (ref >59)
GLUCOSE: 89 mg/dL (ref 65–99)
Potassium: 4.6 mmol/L (ref 3.5–5.2)
Sodium: 140 mmol/L (ref 134–144)

## 2018-05-07 LAB — HEPATIC FUNCTION PANEL
ALT: 12 IU/L (ref 0–44)
AST: 14 IU/L (ref 0–40)
Albumin: 3.9 g/dL (ref 3.6–4.6)
Alkaline Phosphatase: 92 IU/L (ref 39–117)
Bilirubin Total: 1.2 mg/dL (ref 0.0–1.2)
Bilirubin, Direct: 0.27 mg/dL (ref 0.00–0.40)
Total Protein: 6.1 g/dL (ref 6.0–8.5)

## 2018-05-07 LAB — LIPID PANEL
Chol/HDL Ratio: 2.4 ratio (ref 0.0–5.0)
Cholesterol, Total: 80 mg/dL — ABNORMAL LOW (ref 100–199)
HDL: 34 mg/dL — ABNORMAL LOW (ref >39)
LDL Calculated: 29 mg/dL (ref 0–99)
TRIGLYCERIDES: 86 mg/dL (ref 0–149)
VLDL Cholesterol Cal: 17 mg/dL (ref 5–40)

## 2018-05-11 ENCOUNTER — Encounter: Payer: Self-pay | Admitting: Family Medicine

## 2018-05-11 ENCOUNTER — Ambulatory Visit: Payer: Medicare Other | Admitting: Family Medicine

## 2018-05-11 VITALS — BP 106/60 | HR 55 | Temp 98.4°F | Ht 73.0 in | Wt 220.8 lb

## 2018-05-11 DIAGNOSIS — E785 Hyperlipidemia, unspecified: Secondary | ICD-10-CM | POA: Diagnosis not present

## 2018-05-11 DIAGNOSIS — Z8679 Personal history of other diseases of the circulatory system: Secondary | ICD-10-CM

## 2018-05-11 DIAGNOSIS — D692 Other nonthrombocytopenic purpura: Secondary | ICD-10-CM

## 2018-05-11 DIAGNOSIS — I2510 Atherosclerotic heart disease of native coronary artery without angina pectoris: Secondary | ICD-10-CM

## 2018-05-11 DIAGNOSIS — E210 Primary hyperparathyroidism: Secondary | ICD-10-CM

## 2018-05-11 DIAGNOSIS — R599 Enlarged lymph nodes, unspecified: Secondary | ICD-10-CM

## 2018-05-11 DIAGNOSIS — I10 Essential (primary) hypertension: Secondary | ICD-10-CM

## 2018-05-11 DIAGNOSIS — Z9861 Coronary angioplasty status: Secondary | ICD-10-CM

## 2018-05-11 NOTE — Patient Instructions (Addendum)
Please consider restarting water exercises as lower risk to your joints and exercise in and of itself has been proven to help fatigue  We will call you within two weeks about your referral for repeat CT scan of the abdomen to follow up on prior enlarged lymph nodes in light of continued fatigue. If you do not hear within 3 weeks, give [place] a call.

## 2018-05-11 NOTE — Progress Notes (Signed)
Phone [phone]   Subjective:  Jose Ware is a 84 y.o. year old very pleasant male patient who presents for/with See problem oriented charting ROS- No chest pain or shortness of breath. No headache or blurry vision.     Past Medical History-  Patient Active Problem List   Diagnosis Date Noted  . CAD S/P PCI     Priority: High  . Ischemic cardiomyopathy     Priority: High  . History of embolic stroke     Priority: High  . Primary hyperparathyroidism ([place]) 12/28/2013    Priority: High  . Fatigue 12/01/2013    Priority: High  . History of prostate cancer 12/14/2008    Priority: High  . Post herpetic neuralgia 06/20/2017    Priority: Medium  . Spinal stenosis of lumbar region at multiple levels 04/30/2017    Priority: Medium  . Dyslipidemia 11/16/2015    Priority: Medium  . History of adenomatous polyp of colon 01/18/2015    Priority: Medium  . PSORIASIS 10/15/2007    Priority: Medium  . Essential hypertension 11/27/2006    Priority: Medium  . Drug reaction 02/05/2017    Priority: Low  . Senile purpura ([place]) 02/05/2017    Priority: Low  . History of ventricular tachycardia 11/08/2015    Priority: Low  . History of stomach cancer 12/01/2013    Priority: Low  . Lower back pain 03/24/2013    Priority: Low  . Bradycardia 09/23/2012    Priority: Low  . Bruising 09/23/2012    Priority: Low  . B12 deficiency 09/17/2010    Priority: Low  . PERIPHERAL NEUROPATHY 10/18/2007    Priority: Low  . DEGENERATIVE JOINT DISEASE 11/27/2006    Priority: Low  . NEPHROLITHIASIS, HX OF 11/27/2006    Priority: Low  . Onychomycosis 10/28/2017  . Spondylosis 06/25/2017    Medications- reviewed and updated Current Outpatient Medications  Medication Sig Dispense Refill  . acetaminophen (TYLENOL) 325 MG tablet Take 650 mg by mouth as needed.    Marland Kitchen AMLODIPINE BESYLATE PO 5 mg by Per post-pyloric tube route daily.     Marland Kitchen aspirin EC 81 MG tablet Take 1 tablet (81 mg total) by  mouth daily. 90 tablet 1  . atorvastatin (LIPITOR) 40 MG tablet TAKE 1 TABLET BY MOUTH ONCE DAILY 90 tablet 1  . beta carotene w/minerals (OCUVITE) tablet Take 1 tablet by mouth daily.    . carvedilol (COREG) 6.25 MG tablet TAKE 1 TABLET BY MOUTH TWICE DAILY WITH MEALS .  KEEP  OFFICE  VISIT 180 tablet 2  . Cyanocobalamin (VITAMIN B-12) 5000 MCG TBDP Take 5,000 mcg by mouth daily.    Marland Kitchen losartan (COZAAR) 50 MG tablet Take 50 mg by mouth daily.    . nitroGLYCERIN (NITROSTAT) 0.4 MG SL tablet Place 1 tablet (0.4 mg total) under the tongue every 5 (five) minutes x 3 doses as needed for chest pain. 25 tablet 2  . spironolactone (ALDACTONE) 25 MG tablet TAKE 1 TABLET BY MOUTH ONCE DAILY 90 tablet 3   No current facility-administered medications for this visit.      Objective:  BP 106/60 (BP Location: Left Arm, Patient Position: Sitting, Cuff Size: Large)   Pulse (!) 55   Temp 98.4 F (36.9 C) (Oral)   Ht 6\' 1"  (1.854 m)   Wt 220 lb 12.8 oz (100.2 kg)   SpO2 95%   BMI 29.13 kg/m  Gen: NAD, resting comfortably No cervical lymphadenopathy CV: RRR no murmurs rubs or gallops  Lungs: CTAB no crackles, wheeze, rhonchi Abdomen: soft/nontender/nondistended/overweight Ext: no edema Skin: warm, dry Neuro: Antalgic gait, walks with cane    Assessment and Plan  #Hypertension S: Compliant with losartan 100 mg, spironolactone 25 mg, Coreg 6.25 mg twice daily, amlodipine 5 mg. A/P: Stable. Continue current medications. Seems to be running low normal- may need to reduce medication if lower #s persist   #Hyperlipidemia/CAD status post PCI after STEMI August 2017 S: Patient is compliant with atorvastatin 40 mg.  LDL has been well under 70.  He is also compliant with aspirin- prior was on Plavix as well but had easy bruising/bleeding. Asymptomatic from CAD perspective- cardiology has not thought fatigue is related to heart A/P: Stable problems x2. Continue current medications.     Other notes: 1.   Senile purpura-stable on aspirin alone. Needs to stay on this for CAD 2.  History primary hyperparathyroidism- treated with parathyroid surgery but will continue to monitor calcium levels-recently checked with cardiology and calcium was normal 3. Complains of more fatigue over last 2 years- got temporarily better with parathyroid surgery but now has worsened. As the day progresses he gets tired- particularly in the afternoon. He thinks pain in his knee and low back may be contributing to this. Knee bothers him and feels like may give way if standing for long period and then if repositions then his back bothers him. Arthritis in low back from x-rays last February. Even had low back injection with [facility] but only helped short term. Has done PT in the past- still going to gym 2-3 days a week. Snores at night and wakes up unrefreshed but does have nocturia. Negative testing for sleep apnea x 2. Knee injetions havent been super helpful. Had bloodwork recently with Dr. Martinique and no obvious cause of fatigue found. Did water aerobics years ago and found somewhat helpful. No medicine that would drop b12 and b12 excellent in 2015.  4. Agrees to schedule urology visit   History of ventricular tachycardia Was able to change to history of v tach - from nsvt but no ability to list history nsvt per icd10 cods- no recurrence since MI  Return in about 6 months (around 11/11/2018) for physical.  Lab/Order associations: CAD S/P PCI  Dyslipidemia  Essential hypertension  Enlarged lymph nodes - Plan: CT Abdomen Pelvis W Contrast  Senile purpura ([place]), Chronic  Primary hyperparathyroidism ([place]), Chronic  History of ventricular tachycardia  Return precautions advised.  Garret Reddish, MD

## 2018-05-11 NOTE — Assessment & Plan Note (Signed)
Was able to change to history of v tach - from nsvt but no ability to list history nsvt per icd10 cods- no recurrence since MI

## 2018-06-06 ENCOUNTER — Encounter: Payer: Self-pay | Admitting: Family Medicine

## 2018-07-15 ENCOUNTER — Encounter: Payer: Self-pay | Admitting: Family Medicine

## 2018-07-15 ENCOUNTER — Ambulatory Visit (INDEPENDENT_AMBULATORY_CARE_PROVIDER_SITE_OTHER): Payer: Medicare Other | Admitting: Family Medicine

## 2018-07-15 VITALS — BP 142/73 | HR 51 | Temp 96.8°F | Ht 73.0 in

## 2018-07-15 DIAGNOSIS — M545 Low back pain, unspecified: Secondary | ICD-10-CM

## 2018-07-15 MED ORDER — TRAMADOL HCL 50 MG PO TABS: 25.0000 mg | ORAL_TABLET | Freq: Four times a day (QID) | ORAL | 0 refills | Status: DC | PRN

## 2018-07-15 NOTE — Progress Notes (Signed)
Phone [phone]   Subjective:  Virtual visit via phonenote Chief Complaint  Patient presents with  . Back Pain    x 3days    This visit type was conducted due to national recommendations for restrictions regarding the COVID-19 Pandemic (e.g. social distancing).  This format is felt to be most appropriate for this patient at this time balancing risks to patient and risks to population by having him in for in person visit.  All issues noted in this document were discussed and addressed.  No physical exam was performed (except for noted visual exam or audio findings with Telehealth visits).  The patient has consented to conduct a Telehealth visit and understands insurance will be billed.   Our team/I connected with Jose Ware on 07/15/18 at  3:40 PM EDT by phone (patient did not have equipment for webex) and verified that I am speaking with the correct person using two identifiers.  Location patient: [facility] Location provider: [facility], office Persons participating in the virtual visit:  patient  Time on phone: 12 minutes Counseling provided about covid 19, how to safely come into office, reasoning for x-rays, discussing tramadol as narcotic but lowest strength, wanting to avoid nsaids  Our team/I discussed the limitations of evaluation and management by telemedicine and the availability of in person appointments. In light of current covid-19 pandemic, patient also understands that we are trying to protect them by minimizing in office contact if at all possible.  The patient expressed consent for telemedicine visit and agreed to proceed. Patient understands insurance will be billed.   ROS- no fever/chills/cough/shortness of breath/sore throat  Past Medical History-  Patient Active Problem List   Diagnosis Date Noted  . CAD S/P PCI     Priority: High  . Ischemic cardiomyopathy     Priority: High  . History of embolic stroke     Priority: High  . Primary  hyperparathyroidism ([place]) 12/28/2013    Priority: High  . Fatigue 12/01/2013    Priority: High  . History of prostate cancer 12/14/2008    Priority: High  . Post herpetic neuralgia 06/20/2017    Priority: Medium  . Spinal stenosis of lumbar region at multiple levels 04/30/2017    Priority: Medium  . Dyslipidemia 11/16/2015    Priority: Medium  . History of adenomatous polyp of colon 01/18/2015    Priority: Medium  . PSORIASIS 10/15/2007    Priority: Medium  . Essential hypertension 11/27/2006    Priority: Medium  . Drug reaction 02/05/2017    Priority: Low  . Senile purpura ([place]) 02/05/2017    Priority: Low  . History of ventricular tachycardia 11/08/2015    Priority: Low  . History of stomach cancer 12/01/2013    Priority: Low  . Lower back pain 03/24/2013    Priority: Low  . Bradycardia 09/23/2012    Priority: Low  . Bruising 09/23/2012    Priority: Low  . B12 deficiency 09/17/2010    Priority: Low  . PERIPHERAL NEUROPATHY 10/18/2007    Priority: Low  . DEGENERATIVE JOINT DISEASE 11/27/2006    Priority: Low  . NEPHROLITHIASIS, HX OF 11/27/2006    Priority: Low  . Onychomycosis 10/28/2017  . Spondylosis 06/25/2017    Medications- reviewed and updated Current Outpatient Medications  Medication Sig Dispense Refill  . acetaminophen (TYLENOL) 325 MG tablet Take 650 mg by mouth as needed.    Marland Kitchen AMLODIPINE BESYLATE PO 5 mg by Per post-pyloric tube route daily.     Marland Kitchen aspirin  EC 81 MG tablet Take 1 tablet (81 mg total) by mouth daily. 90 tablet 1  . atorvastatin (LIPITOR) 40 MG tablet TAKE 1 TABLET BY MOUTH ONCE DAILY 90 tablet 1  . beta carotene w/minerals (OCUVITE) tablet Take 1 tablet by mouth daily.    . carvedilol (COREG) 6.25 MG tablet TAKE 1 TABLET BY MOUTH TWICE DAILY WITH MEALS .  KEEP  OFFICE  VISIT 180 tablet 2  . Cyanocobalamin (VITAMIN B-12) 5000 MCG TBDP Take 5,000 mcg by mouth daily.    Marland Kitchen losartan (COZAAR) 50 MG tablet Take 50 mg by mouth daily.    .  nitroGLYCERIN (NITROSTAT) 0.4 MG SL tablet Place 1 tablet (0.4 mg total) under the tongue every 5 (five) minutes x 3 doses as needed for chest pain. 25 tablet 2  . spironolactone (ALDACTONE) 25 MG tablet TAKE 1 TABLET BY MOUTH ONCE DAILY 90 tablet 3  . traMADol (ULTRAM) 50 MG tablet Take 0.5-1 tablets (25-50 mg total) by mouth every 6 (six) hours as needed for moderate pain or severe pain. 20 tablet 0   No current facility-administered medications for this visit.      Objective:  BP (!) 142/73 (BP Location: Left Arm, Patient Position: Sitting, Cuff Size: Normal)   Pulse (!) 51   Temp (!) 96.8 F (36 C)   Ht 6\' 1"  (1.854 m)   BMI 29.13 kg/m  self reported vitals  Nonlabored voice, normal speech      Assessment and Plan   #Low back pain with history of spinal stenosis of lumbar region-last MRI in 2017 S: Patient has been followed with Dr. Paulla Fore since last February-ultimately ended up withOne injection epidural with Dr. Ernestina Patches L1-L2- only gve relief for half a day. Left leg weakness stable from football days- no new weakness in legs. He was doing somewhat better by April follow up and they discussed possible repeat epidural injection if needed. Lidocaine patches ordered but were too expensive. Icy hot eases it some. Took tylenol and on one occasion took an aleve because pain was so bad (he knows heart doctor says to avoid if possible)  2-3 days ago tripped in the yard and back hurt so bad it was hard to even get up. Last night almost couldn't get off toilet due to the pain. Had been walking everyday but unable to walk  Pain hurts in a band across the low back- right above the hips. Pain can get up to 10/10. Does not go down into legs.  A/P: With sudden onset pain after tripping and then would fall to his knees at his age- we agreed we should rule out any fracture in the lumbar spine-he will come back for x-rays tomorrow.  For pain control-we agreed to trial tramadol since Tylenol has not been  working and NSAIDs are not ideal with his medical issues.  If no fracture but pain is not improving over the coming week or 2-could refer to Dr. Tamala Julian of sports medicine as Dr. Paulla Fore is no longer with [facility]  Future Appointments  Date Time Provider [place]  11/24/2018  9:20 AM [name], MD [facility]   Lab/Order associations: Acute bilateral low back pain without sciatica - Plan: DG Lumbar Spine Complete  Meds ordered this encounter  Medications  . traMADol (ULTRAM) 50 MG tablet    Sig: Take 0.5-1 tablets (25-50 mg total) by mouth every 6 (six) hours as needed for moderate pain or severe pain.    Dispense:  20  tablet    Refill:  0    Return precautions advised.  Garret Reddish, MD

## 2018-07-15 NOTE — Telephone Encounter (Signed)
This has been taking care of. Pt has been scheduled for today!

## 2018-07-15 NOTE — Patient Instructions (Signed)
There are no preventive care reminders to display for this patient.  Depression screen [facility] 2/9 05/11/2018 05/07/2017 02/05/2017  Decreased Interest 0 0 0  Down, Depressed, Hopeless 0 0 0  PHQ - 2 Score 0 0 0  Some recent data might be hidden

## 2018-07-16 ENCOUNTER — Other Ambulatory Visit: Payer: Medicare Other

## 2018-07-16 ENCOUNTER — Other Ambulatory Visit: Payer: Self-pay

## 2018-07-16 ENCOUNTER — Ambulatory Visit (INDEPENDENT_AMBULATORY_CARE_PROVIDER_SITE_OTHER): Payer: Medicare Other

## 2018-07-16 DIAGNOSIS — M5136 Other intervertebral disc degeneration, lumbar region: Secondary | ICD-10-CM | POA: Diagnosis not present

## 2018-07-16 DIAGNOSIS — M545 Low back pain, unspecified: Secondary | ICD-10-CM

## 2018-07-19 ENCOUNTER — Encounter: Payer: Self-pay | Admitting: Gastroenterology

## 2018-07-20 DIAGNOSIS — R310 Gross hematuria: Secondary | ICD-10-CM | POA: Diagnosis not present

## 2018-07-20 DIAGNOSIS — R3121 Asymptomatic microscopic hematuria: Secondary | ICD-10-CM | POA: Diagnosis not present

## 2018-07-31 ENCOUNTER — Other Ambulatory Visit: Payer: Self-pay | Admitting: Cardiology

## 2018-09-11 ENCOUNTER — Other Ambulatory Visit: Payer: Self-pay | Admitting: Family Medicine

## 2018-09-14 ENCOUNTER — Ambulatory Visit (INDEPENDENT_AMBULATORY_CARE_PROVIDER_SITE_OTHER): Payer: Medicare Other | Admitting: Family Medicine

## 2018-09-14 ENCOUNTER — Other Ambulatory Visit: Payer: Self-pay

## 2018-09-14 ENCOUNTER — Encounter: Payer: Self-pay | Admitting: Family Medicine

## 2018-09-14 VITALS — BP 120/62 | HR 57 | Temp 98.3°F | Ht 73.0 in | Wt 226.4 lb

## 2018-09-14 DIAGNOSIS — H1131 Conjunctival hemorrhage, right eye: Secondary | ICD-10-CM | POA: Diagnosis not present

## 2018-09-14 DIAGNOSIS — I2510 Atherosclerotic heart disease of native coronary artery without angina pectoris: Secondary | ICD-10-CM

## 2018-09-14 DIAGNOSIS — I10 Essential (primary) hypertension: Secondary | ICD-10-CM

## 2018-09-14 DIAGNOSIS — E785 Hyperlipidemia, unspecified: Secondary | ICD-10-CM

## 2018-09-14 DIAGNOSIS — Z9861 Coronary angioplasty status: Secondary | ICD-10-CM

## 2018-09-14 DIAGNOSIS — E663 Overweight: Secondary | ICD-10-CM

## 2018-09-14 MED ORDER — AMLODIPINE BESYLATE 5 MG PO TABS: 5.0000 mg | ORAL_TABLET | Freq: Every day | ORAL | 3 refills | Status: DC

## 2018-09-14 NOTE — Patient Instructions (Addendum)
Congrats on the new grandbaby's  I think your pharmacist was spot on- looks like you burst an eye vessel between the aleve and some straining with lifting- since you dont have pain, discharge, blurry vision and its improving- lets continue to monitor- let us know if things worsen  Other medical problems appear stable   See you in September for physical- hold off on bloodwork until then

## 2018-09-14 NOTE — Progress Notes (Signed)
Phone [phone]   Subjective:  Jose Ware is a 84 y.o. year old very pleasant male patient who presents for/with See problem oriented charting Chief Complaint  Patient presents with  . Acute Visit  . Eye Injury   ROS-no blurry or double vision, no fever or chills, no eye discharge.  No eye pain.  No headaches.  Past Medical History-  Patient Active Problem List   Diagnosis Date Noted  . CAD S/P PCI     Priority: High  . Ischemic cardiomyopathy     Priority: High  . History of embolic stroke     Priority: High  . Primary hyperparathyroidism ([place]) 12/28/2013    Priority: High  . Fatigue 12/01/2013    Priority: High  . History of prostate cancer 12/14/2008    Priority: High  . Post herpetic neuralgia 06/20/2017    Priority: Medium  . Spinal stenosis of lumbar region at multiple levels 04/30/2017    Priority: Medium  . Dyslipidemia 11/16/2015    Priority: Medium  . History of adenomatous polyp of colon 01/18/2015    Priority: Medium  . PSORIASIS 10/15/2007    Priority: Medium  . Essential hypertension 11/27/2006    Priority: Medium  . Drug reaction 02/05/2017    Priority: Low  . Senile purpura ([place]) 02/05/2017    Priority: Low  . History of ventricular tachycardia 11/08/2015    Priority: Low  . History of stomach cancer 12/01/2013    Priority: Low  . Lower back pain 03/24/2013    Priority: Low  . Bradycardia 09/23/2012    Priority: Low  . Bruising 09/23/2012    Priority: Low  . B12 deficiency 09/17/2010    Priority: Low  . PERIPHERAL NEUROPATHY 10/18/2007    Priority: Low  . DEGENERATIVE JOINT DISEASE 11/27/2006    Priority: Low  . NEPHROLITHIASIS, HX OF 11/27/2006    Priority: Low  . Onychomycosis 10/28/2017  . Spondylosis 06/25/2017    Medications- reviewed and updated Current Outpatient Medications  Medication Sig Dispense Refill  . acetaminophen (TYLENOL) 325 MG tablet Take 650 mg by mouth as needed.    Marland Kitchen aspirin EC 81 MG tablet Take 1  tablet (81 mg total) by mouth daily. 90 tablet 1  . atorvastatin (LIPITOR) 40 MG tablet TAKE 1 TABLET BY MOUTH ONCE DAILY 90 tablet 1  . beta carotene w/minerals (OCUVITE) tablet Take 1 tablet by mouth daily.    . carvedilol (COREG) 6.25 MG tablet TAKE 1 TABLET BY MOUTH TWICE DAILY WITH MEALS .  KEEP  OFFICE  VISIT 180 tablet 2  . Cyanocobalamin (VITAMIN B-12) 5000 MCG TBDP Take 5,000 mcg by mouth daily.    Marland Kitchen losartan (COZAAR) 100 MG tablet Take 1 tablet by mouth once daily 90 tablet 0  . nitroGLYCERIN (NITROSTAT) 0.4 MG SL tablet Place 1 tablet (0.4 mg total) under the tongue every 5 (five) minutes x 3 doses as needed for chest pain. 25 tablet 2  . spironolactone (ALDACTONE) 25 MG tablet TAKE 1 TABLET BY MOUTH ONCE DAILY 90 tablet 3  . traMADol (ULTRAM) 50 MG tablet Take 0.5-1 tablets (25-50 mg total) by mouth every 6 (six) hours as needed for moderate pain or severe pain. 20 tablet 0  . amLODipine (NORVASC) 5 MG tablet Take 1 tablet (5 mg total) by mouth daily. 90 tablet 3   No current facility-administered medications for this visit.      Objective:  BP 120/62   Pulse (!) 57   Temp 98.3  F (36.8 C) (Oral)   Ht 6\' 1"  (1.854 m)   Wt 226 lb 6.4 oz (102.7 kg)   SpO2 95%   BMI 29.87 kg/m  Gen: NAD, resting comfortably CV: RRR no murmurs rubs or gallops Lungs: CTAB no crackles, wheeze, rhonchi Ext: no edema Skin: warm, dry Eye: PERRLA, left eye largely normal, right eye-medial and inferior to iris there is a mildly erythematous patch- smaller similar patch lateral to right iris.  Extraocular movements intact without pain.  No eye discharge noted.    Assessment and Plan   #Social update-3 greatgrandkids born in April and may- up [place] so unfortunately has not been able to see them.  Also had 3 friends die in this timeframe and was unable to go to the funeral due to coronavirus  # Eye Injury S: back has been hurting him for about a week- he had taken tylenol for about a week but no  significant relief- tried an aleve and worked and was moving around a lot of stuff to try to prep for fence getting put in. Did do some straining  Thursday evening noted bright redness lateral to pupil and also felt like he was squinting more. On Friday tried to have a visit- went saw his pharmacist and they thought he may have burst an eye vessel. Since Thursday symptoms have improved. No blurry vision or floaters. No headaches. Back pain has remain much improved.   Denies being hit or eye pain.  Denies rubbing eyes.  Does not report wearing contacts A/P: Patient has a subconjunctival hemorrhage which is already improving- no red flags today- continue to monitor at home.  Being on aspirin and heavy lifting likely contributed  #hypertension S: controlled on amlodipine 5 mg BP Readings from Last 3 Encounters:  09/14/18 120/62  07/15/18 (!) 142/73  05/11/18 106/60  A/P:  Stable. Continue current medications.    #hyperlipidemia/CAD  s: Lipids very well controlled on atorvastatin 40 mg on last check with LDL at 29 and total cholesterol at 80  He denies chest pain or shortness of breath related to coronary artery disease.  Compliant with aspirin  A/P: Dr. Martinique was okay with these levels so we will not make adjustments-if any side effects from medication would definitely consider reducing to 20 mg atorvastatin  CAD appears stable-has follow-up with Dr. Martinique later this year.  Continue aspirin despite subconjunctival hemorrhage   # overweight S: Patient has gained some weight up 6 pounds from last visit.  He is trying to be somewhat active by doing some walking daily with his cane and doing yard work.  Admits he may have slipped up on his diet Wt Readings from Last 3 Encounters:  09/14/18 226 lb 6.4 oz (102.7 kg)  05/11/18 220 lb 12.8 oz (100.2 kg)  05/06/18 217 lb 9.6 oz (98.7 kg)  A/P: Poor control- advised to reverse trend- Encouraged need for healthy eating, regular exercise, weight  loss.  Even a few pounds off by physical would be good progress  Scheduled for physical in September-likely just direct LDL at that time as had full lipid panel in February Future Appointments  Date Time Provider [place]  11/24/2018  9:20 AM [name], MD [facility]  12/06/2018  8:00 AM Martinique, Peter M, MD [facility]   Lab/Order associations:   ICD-10-CM   1. Subconjunctival hemorrhage of right eye  H11.31   2. Dyslipidemia  E78.5   3. Essential hypertension  I10   4. CAD  S/P PCI  I25.10    Z98.61   5. Overweight  E66.3     Meds ordered this encounter  Medications  . amLODipine (NORVASC) 5 MG tablet    Sig: Take 1 tablet (5 mg total) by mouth daily.    Dispense:  90 tablet    Refill:  3    Return precautions advised.  Garret Reddish, MD

## 2018-10-16 ENCOUNTER — Other Ambulatory Visit: Payer: Self-pay | Admitting: Cardiology

## 2018-10-23 ENCOUNTER — Other Ambulatory Visit: Payer: Self-pay | Admitting: Family Medicine

## 2018-10-27 ENCOUNTER — Other Ambulatory Visit: Payer: Self-pay | Admitting: Family Medicine

## 2018-10-30 ENCOUNTER — Other Ambulatory Visit: Payer: Self-pay | Admitting: Cardiology

## 2018-11-01 DIAGNOSIS — H52223 Regular astigmatism, bilateral: Secondary | ICD-10-CM | POA: Diagnosis not present

## 2018-11-01 DIAGNOSIS — H35372 Puckering of macula, left eye: Secondary | ICD-10-CM | POA: Diagnosis not present

## 2018-11-01 DIAGNOSIS — H5211 Myopia, right eye: Secondary | ICD-10-CM | POA: Diagnosis not present

## 2018-11-01 DIAGNOSIS — Z961 Presence of intraocular lens: Secondary | ICD-10-CM | POA: Diagnosis not present

## 2018-11-01 DIAGNOSIS — H353132 Nonexudative age-related macular degeneration, bilateral, intermediate dry stage: Secondary | ICD-10-CM | POA: Diagnosis not present

## 2018-11-09 ENCOUNTER — Encounter

## 2018-11-24 ENCOUNTER — Encounter: Payer: Medicare Other | Admitting: Family Medicine

## 2018-12-01 NOTE — Progress Notes (Signed)
12/06/2018 Jose Ware   09-Apr-1934  [id number]  Primary Physician Jose Channel Brayton Mars, MD Primary Cardiologist: Dr Jose Ware  HPI:  [name] is seen for follow up CAD. He has a history of HTN, remote tobacco abuse, psoriasis, prostate cancer s/p XRT and radiation seed therapy, adenomatous colon polyps and diverticulitis s/p previous abdominal surgeries.  He presented to [facility] on 11/04/15 as a inferolateral STEMI. Emergent cath revealed a very large LCx with extensive thrombus. He underwent PCI with DES. With reperfusion he had NSVT. The pt also had residual RCA and LAD disease and an EF of 25% at cath. Echo done 11/05/15 showed an EF of 40-45%. His Troponin peaked at 25. On 11/06/15 he was taken back to the lab for staged PCI to his LAD and RCA. This was successful but on 11/07/15 the pt related that he developed some word finding issues that started post PCI 8/29. MRI revealed scattered small acute infarcts in the bilateral anterior and posterior circulation.  CA dopplers revealed mild plaque (1-39%). The pt's symptoms were improving and no further therapy was recommended.    On follow up today he reports he is doing well except he has arthritis in his knee and back. Denies any chest pain, SOB, palpitations. No dizziness. Energy level is good. He is active doing yard work and walking.   Current Outpatient Medications  Medication Sig Dispense Refill  . acetaminophen (TYLENOL) 325 MG tablet Take 650 mg by mouth as needed.    Marland Kitchen amLODipine (NORVASC) 5 MG tablet Take 1 tablet (5 mg total) by mouth daily. 90 tablet 3  . aspirin EC 81 MG tablet Take 1 tablet (81 mg total) by mouth daily. 90 tablet 1  . atorvastatin (LIPITOR) 40 MG tablet Take 1 tablet (40 mg total) by mouth daily. 90 tablet 3  . beta carotene w/minerals (OCUVITE) tablet Take 1 tablet by mouth daily.    . Cyanocobalamin (VITAMIN B-12) 5000 MCG TBDP Take 5,000 mcg by mouth daily.    Marland Kitchen losartan (COZAAR) 100 MG tablet Take 1 tablet  (100 mg total) by mouth daily. 90 tablet 3  . nitroGLYCERIN (NITROSTAT) 0.4 MG SL tablet Place 1 tablet (0.4 mg total) under the tongue every 5 (five) minutes x 3 doses as needed for chest pain. 25 tablet 2  . spironolactone (ALDACTONE) 25 MG tablet Take 1 tablet (25 mg total) by mouth daily. 90 tablet 3  . carvedilol (COREG) 3.125 MG tablet Take 1 tablet (3.125 mg total) by mouth 2 (two) times daily. 180 tablet 3  . traMADol (ULTRAM) 50 MG tablet Take 0.5-1 tablets (25-50 mg total) by mouth every 6 (six) hours as needed for moderate pain or severe pain. (Patient not taking: Reported on 12/06/2018) 20 tablet 0   No current facility-administered medications for this visit.     Allergies  Allergen Reactions  . Bee Venom Swelling    Facial swelling  . Betadine [Povidone Iodine] Other (See Comments)    blistering  . Doxycycline     Possible drug rash- back of right lower leg and onto left lower leg as well    Social History   Socioeconomic History  . Marital status: Married    Spouse name: Not on file  . Number of children: 3  . Years of education: Not on file  . Highest education level: Not on file  Occupational History  . Not on file  Social Needs  . Financial resource strain: Not on file  .  Food insecurity    Worry: Not on file    Inability: Not on file  . Transportation needs    Medical: Not on file    Non-medical: Not on file  Tobacco Use  . Smoking status: Former Smoker    Packs/day: 2.00    Years: 37.00    Pack years: 74.00    Quit date: 03/11/1983    Years since quitting: 35.7  . Smokeless tobacco: Never Used  . Tobacco comment: started smoking in the service; ICU 85' part of his stomach; appendix; coma   Substance and Sexual Activity  . Alcohol use: Yes    Alcohol/week: 0.0 standard drinks    Comment: RARE  . Drug use: No  . Sexual activity: Not on file  Lifestyle  . Physical activity    Days per week: Not on file    Minutes per session: Not on file  .  Stress: Not on file  Relationships  . Social Herbalist on phone: Not on file    Gets together: Not on file    Attends religious service: Not on file    Active member of club or organization: Not on file    Attends meetings of clubs or organizations: Not on file    Relationship status: Not on file  . Intimate partner violence    Fear of current or ex partner: Not on file    Emotionally abused: Not on file    Physically abused: Not on file    Forced sexual activity: Not on file  Other Topics Concern  . Not on file  Social History Narrative   [employer] [place], [place].   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  All other systems reviewed and are otherwise negative except as noted above.  Blood pressure (!) 102/52, pulse (!) 50, temperature 97.6 F (36.4 C), height 6' (1.829 m), weight 216 lb (98 kg), SpO2 96 %.  GENERAL:  Well appearing WM in NAD HEENT:  PERRL, EOMI, sclera are clear. Oropharynx is clear. NECK:  No jugular venous distention, carotid upstroke brisk and symmetric, no bruits, no thyromegaly or adenopathy LUNGS:  Clear to auscultation bilaterally CHEST:  Unremarkable HEART:  RRR,  PMI not displaced or sustained,S1 and S2 within normal limits, no S3, no S4: no clicks, no rubs, no murmurs ABD:  Soft, nontender. BS +, no masses or bruits. No hepatomegaly, no splenomegaly EXT:  2 + pulses throughout, no edema, no cyanosis no clubbing SKIN:  Warm and dry.  No rashes NEURO:  Alert and oriented x 3. Cranial nerves II through XII intact. PSYCH:  Cognitively intact   Laboratory data:  Lab Results  Component  Value Date   WBC 8.0 05/06/2018   HGB 13.5 05/06/2018   HCT 38.7 05/06/2018   PLT 259 05/06/2018   GLUCOSE 89 05/06/2018   CHOL WILL FOLLOW 05/06/2018   TRIG WILL FOLLOW 05/06/2018   HDL WILL FOLLOW 05/06/2018   LDLDIRECT 31.0 11/06/2017   [place] WILL FOLLOW 05/06/2018  ALT 12 05/06/2018   AST 14 05/06/2018   NA 140 05/06/2018   K 4.6 05/06/2018   CL 103 05/06/2018   CREATININE 1.04 05/06/2018   BUN 27 05/06/2018   CO2 28 05/06/2018   TSH 3.240 05/06/2018   PSA <0.015 01/22/2016   INR 1.08 11/04/2015   HGBA1C 5.3 11/04/2015   Ecg today shows sinus brady with rate 50. Old inferoposterior infarct. No acute change. I have personally reviewed and interpreted this study.   ASSESSMENT AND PLAN:  1. CAD s/p inferolateral STEMI in August 2017 with thrombotic occlusion of the LCx. S/p DES. Subsequent staged PCI of the proximal LAD and RCA. He has no anginal symptoms. Continue ASA. Due to bradycardia will reduce Coreg to 3.125 mg bid.  2. Ischemic LV dysfunction. Asymptomatic. On Coreg. Losartan, aldactone. No overt evidence of  CHF. 3. 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 No residual symptoms.  4. Hypertension. Well controlled now.  5. Hyperlipidemia. On high dose statin. For some reason lipid panel in February was never resulted. He reports he is due to see Dr Jose Channel soon and will have lab work done.      PLAN   Follow up in 6 months.  Eean Buss Martinique MD,FACC 12/06/2018 8:15 AM

## 2018-12-06 ENCOUNTER — Ambulatory Visit (INDEPENDENT_AMBULATORY_CARE_PROVIDER_SITE_OTHER): Payer: Medicare Other | Admitting: Cardiology

## 2018-12-06 ENCOUNTER — Other Ambulatory Visit: Payer: Self-pay

## 2018-12-06 ENCOUNTER — Encounter: Payer: Self-pay | Admitting: Cardiology

## 2018-12-06 VITALS — BP 102/52 | HR 50 | Temp 97.6°F | Ht 72.0 in | Wt 216.0 lb

## 2018-12-06 DIAGNOSIS — I10 Essential (primary) hypertension: Secondary | ICD-10-CM

## 2018-12-06 DIAGNOSIS — I2510 Atherosclerotic heart disease of native coronary artery without angina pectoris: Secondary | ICD-10-CM

## 2018-12-06 DIAGNOSIS — E785 Hyperlipidemia, unspecified: Secondary | ICD-10-CM

## 2018-12-06 DIAGNOSIS — I255 Ischemic cardiomyopathy: Secondary | ICD-10-CM

## 2018-12-06 DIAGNOSIS — Z9861 Coronary angioplasty status: Secondary | ICD-10-CM | POA: Diagnosis not present

## 2018-12-06 DIAGNOSIS — I5022 Chronic systolic (congestive) heart failure: Secondary | ICD-10-CM | POA: Diagnosis not present

## 2018-12-06 MED ORDER — ATORVASTATIN CALCIUM 40 MG PO TABS: 40.0000 mg | ORAL_TABLET | Freq: Every day | ORAL | 3 refills | Status: DC

## 2018-12-06 MED ORDER — SPIRONOLACTONE 25 MG PO TABS: 25.0000 mg | ORAL_TABLET | Freq: Every day | ORAL | 3 refills | Status: DC

## 2018-12-06 MED ORDER — LOSARTAN POTASSIUM 100 MG PO TABS: 100.0000 mg | ORAL_TABLET | Freq: Every day | ORAL | 3 refills | Status: DC

## 2018-12-06 MED ORDER — CARVEDILOL 3.125 MG PO TABS: 3.1250 mg | ORAL_TABLET | Freq: Two times a day (BID) | ORAL | 3 refills | Status: DC

## 2018-12-06 NOTE — Patient Instructions (Signed)
Reduce Carvedilol (Coreg) to 3.125 mg twice a day  Continue your other therapy  Follow up in 6 months.

## 2018-12-16 NOTE — Patient Instructions (Addendum)
Health Maintenance Due  Topic Date Due  . INFLUENZA VACCINE -today 10/09/2018  . COLONOSCOPY -waiting until next year 12/16/2018   No changes today unless labs lead [place] to make changes. If labs all normal with your snoring and fatigue and daytime sleepiness- we could do a sleep study- certainly ask about this if labs all normal  For now with fatigue and weight loss- lets try to stablize weight for now- and if you increase intake and are still losing weight we need to chat or if fatigue worsens or other symptoms come up   Please stop by lab before you go If you do not have mychart- we will call you about results within 5 business days of [place] receiving them.  If you have mychart- we will send your results within 3 business days of [place] receiving them.  If abnormal or we want to clarify a result, we will call or mychart you to make sure you receive the message.  If you have questions or concerns or don't hear within 5-7 days, please send [place] a message or call us.

## 2018-12-16 NOTE — Progress Notes (Signed)
Phone: [phone]   Subjective:  Patient presents today for their annual physical. Chief complaint-noted.   See problem oriented charting- ROS- full  review of systems was completed and negative  except for: fatigue, joint pain,back pain  The following were reviewed and entered/updated in epic: Past Medical History:  Diagnosis Date  . Adenocarcinoma of prostate [facility])    XRT & Radiation seed implantation in 2010  . Adenomatous colon polyp   . CAD (coronary artery disease)    a. 11/04/15:  Acute inferolateral STEMI: S/p emergent DES of a very large LCx with extensive thrombus. Significant residual disease in the proximal LAD and distal RCA. S/p staged PCI of LAD and RCA 8/29.  . Cataract   . CVA (cerebral infarction)    a. 123456: embolic CVA after heart cath   . CVA (cerebral infarction)    a. 123456: embolic CVA after heart cath   . DIVERTICULITIS, HX OF 11/27/2006       . DJD (degenerative joint disease)    wrist - R   . Hernia   . History of blood transfusion 1985   with colon surgery  . Hypertension   . Ischemic cardiomyopathy    a. EF 25-30% by LV gram on cath 11/04/15. Appeared out of proportion to infarct although infarct was large. EF improved by Echo and now 40-45%.  . Myocardial infarction ([place])   . NEPHROLITHIASIS, HX OF 11/27/2006        . Peripheral neuropathy   . Psoriasis   . Tenosynovitis    Patient Active Problem List   Diagnosis Date Noted  . CAD S/P PCI     Priority: High  . Ischemic cardiomyopathy     Priority: High  . History of embolic stroke     Priority: High  . Primary hyperparathyroidism ([place]) 12/28/2013    Priority: High  . Fatigue 12/01/2013    Priority: High  . History of prostate cancer 12/14/2008    Priority: High  . Post herpetic neuralgia 06/20/2017    Priority: Medium  . Spinal stenosis of lumbar region at multiple levels 04/30/2017    Priority: Medium  . Dyslipidemia 11/16/2015    Priority: Medium  . History of adenomatous  polyp of colon 01/18/2015    Priority: Medium  . PSORIASIS 10/15/2007    Priority: Medium  . Essential hypertension 11/27/2006    Priority: Medium  . Drug reaction 02/05/2017    Priority: Low  . Senile purpura ([place]) 02/05/2017    Priority: Low  . History of ventricular tachycardia 11/08/2015    Priority: Low  . History of stomach cancer 12/01/2013    Priority: Low  . Lower back pain 03/24/2013    Priority: Low  . Bradycardia 09/23/2012    Priority: Low  . Bruising 09/23/2012    Priority: Low  . B12 deficiency 09/17/2010    Priority: Low  . PERIPHERAL NEUROPATHY 10/18/2007    Priority: Low  . DEGENERATIVE JOINT DISEASE 11/27/2006    Priority: Low  . NEPHROLITHIASIS, HX OF 11/27/2006    Priority: Low  . Onychomycosis 10/28/2017  . Spondylosis 06/25/2017   Past Surgical History:  Procedure Laterality Date  . APPENDECTOMY  1985  . BACK SURGERY  1980   minor disk surgery: instrumentation placed and removed in a second procedure  . CARDIAC CATHETERIZATION N/A 11/04/2015   Procedure: Left Heart Cath and Coronary Angiography;  Surgeon: Peter M Martinique, MD;  Location: [place] [facility];  Service: Cardiovascular;  Laterality: N/A;  .  CARDIAC CATHETERIZATION N/A 11/04/2015   Procedure: Coronary Stent Intervention;  Surgeon: Peter M Martinique, MD;  Location: [place] [facility];  Service: Cardiovascular;  Laterality: N/A;  . CARDIAC CATHETERIZATION N/A 11/06/2015   Procedure: Coronary Stent Intervention;  Surgeon: Leonie Man, MD;  Location: [place] [facility];  Service: Cardiovascular;  Laterality: N/A;  . CATARACT EXTRACTION, BILATERAL  2013  . CHOLECYSTECTOMY  1986  . COLON SURGERY  1980's   pt. had ileus, had colon surgery, requiring colostomy & then reversal & then dehisence of that wound & return to OR for repair & cholecystectomy    . COLONOSCOPY    . COLOSTOMY  1985   After colectomy for diverticulitis, pt. remarks during this surgery they "gave me the paddles two times   because of bleeding", pt. states it was unrelated to any anesthesia complication  . EYE SURGERY     /w IOL  . KNEE ARTHROSCOPY Left 2003  . KNEE SURGERY  1956   Left 1956, Right w/cartilage removed later  . PARATHYROIDECTOMY N/A 05/12/2014   Procedure: PARATHYROIDECTOMY;  Surgeon: Armandina Gemma, MD;  Location: [place];  Service: General;  Laterality: N/A;  . REVERSAL OF COLOSTOMY  1987  . TONSILLECTOMY  1946  . WRIST SURGERY Left    Remote complicated w/infection    Family History  Problem Relation Age of Onset  . Coronary artery disease Mother   . Alcohol abuse Father   . Cirrhosis Father   . Heart failure Sister   . Breast cancer Sister        W/involvement of right arm leading to amputation  . Coronary artery disease Brother   . Heart attack Brother   . Clotting disorder Brother   . Prostate cancer Neg Hx   . Colon cancer Neg Hx   . Diabetes Neg Hx   . Glaucoma Neg Hx   . Rectal cancer Neg Hx   . Esophageal cancer Neg Hx     Medications- reviewed and updated Current Outpatient Medications  Medication Sig Dispense Refill  . acetaminophen (TYLENOL) 325 MG tablet Take 650 mg by mouth as needed.    Marland Kitchen amLODipine (NORVASC) 5 MG tablet Take 1 tablet (5 mg total) by mouth daily. 90 tablet 3  . aspirin EC 81 MG tablet Take 1 tablet (81 mg total) by mouth daily. 90 tablet 1  . atorvastatin (LIPITOR) 40 MG tablet Take 1 tablet (40 mg total) by mouth daily. 90 tablet 3  . beta carotene w/minerals (OCUVITE) tablet Take 1 tablet by mouth daily.    . carvedilol (COREG) 3.125 MG tablet Take 1 tablet (3.125 mg total) by mouth 2 (two) times daily. 180 tablet 3  . Cyanocobalamin (VITAMIN B-12) 5000 MCG TBDP Take 5,000 mcg by mouth daily.    Marland Kitchen losartan (COZAAR) 100 MG tablet Take 1 tablet (100 mg total) by mouth daily. 90 tablet 3  . nitroGLYCERIN (NITROSTAT) 0.4 MG SL tablet Place 1 tablet (0.4 mg total) under the tongue every 5 (five) minutes x 3 doses as needed for chest pain. 25 tablet 2  .  spironolactone (ALDACTONE) 25 MG tablet Take 1 tablet (25 mg total) by mouth daily. 90 tablet 3  . traMADol (ULTRAM) 50 MG tablet Take 0.5-1 tablets (25-50 mg total) by mouth every 6 (six) hours as needed for moderate pain or severe pain. 20 tablet 0   No current facility-administered medications for this visit.     Allergies-reviewed and updated Allergies  Allergen Reactions  .  Bee Venom Swelling    Facial swelling  . Betadine [Povidone Iodine] Other (See Comments)    blistering  . Doxycycline     Possible drug rash- back of right lower leg and onto left lower leg as well    Social History   Social History Narrative   [place].   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 No rebound or guarding.  Ext: trace edema, 2+ PT pulses  Skin: warm, dry Neuro: grossly normal, moves all extremities, normal speech, walks with staff/cane     Assessment and Plan  84 y.o. male presenting for annual physical.  Health Maintenance counseling: 1. Anticipatory guidance: Patient counseled regarding regular dental exams -q6 months, eye exams - twice yearly,  avoiding smoking and second hand smoke , limiting alcohol to 2 beverages per day - rare alcohol.   2. Risk factor  reduction:  Advised patient of need for regular exercise and diet rich and fruits and vegetables to reduce risk of heart attack and stroke. Exercise- limited to walking. cutting grass once every two weeks and working outside. Diet- wife is pushing healthier diet and this has helped with weight loss.- states weight loss is very intentional.  Wt Readings from Last 3 Encounters:  12/17/18 212 lb 3.2 oz (96.3 kg)  12/06/18 216 lb (98 kg)  09/14/18 226 lb 6.4 oz (102.7 kg)  3. Immunizations/screenings/ancillary studies-high-dose flu shot given today.  Discussed Shingrix at pharmacy- he had this x2 but we were only able to pull in one Immunization History  Administered Date(s) Administered  . Fluad Quad(high Dose 65+) 12/17/2018  . Influenza Split 01/06/2012  . Influenza Whole 12/19/2008, 12/27/2009  . Influenza, High Dose Seasonal PF 12/21/2012, 12/22/2014, 02/05/2017, 12/08/2017  . Influenza,inj,Quad PF,6+ Mos 12/01/2013, 10/24/2015  . Pneumococcal Conjugate-13 09/14/2015  . Pneumococcal Polysaccharide-23 02/12/2009  . Td 03/12/2003  . Tdap 02/27/2014  . Zoster Recombinat (Shingrix) 11/24/2017   4. Prostate cancer screening-  Follows with urology due to history- he wants [place] to check a psa with labs  Lab Results  Component Value Date   PSA <0.015 01/22/2016   PSA 0.01 (L) 12/15/2014   PSA 0.39 12/14/2008   5. Colon cancer screening - History of adenomatous polyp of colon last completed in 2019- Dr. Fuller Plan recommended 1 year but there is concern if insurance will cover- he has opted to push exam to 2021 6. Skin cancer screening- has seen dermatology in past- Dr. Ronnald Ramp. advised regular sunscreen use. Denies worrisome, changing, or new skin lesions.  7.  Former smoker-quit in 1980s-no cancer screening recommended related to smoking history.  Status of chronic or  acute concerns   CAD status post PCI- patient with history of MI August 2017 with stents placed. Astymptomatic. He is on aspirin 81mg   and statin and beta blockre  Starting July or august noted some fatigue - seemed to have gotten better. No chest pain or shortness  Of breath even with activity. Notes things take him about twice as long as feels like stopping to rest. Can also fall asleep very easily. Excess daytime somnolence- seems more prevalent. Wife states he snores - will check tsh in addition to other labs including calcium due to history hyperparathyroidism - could also consider sleep study if labs normal.   ischemic cardiomyopathy-EF as low as 25 to 30% but in August 2017 was up to 45-50%.  Medications for blood pressure also beneficial for this- on ARB, spironolactone, beta-blocker.  Amlodipine is not ideal but has been necessary to control blood pressure  History of embolic stroke after staged PCI- no residual effects   Primary hyperparathyroidism ([place])- status post parathyroidectomy-we will monitor calcium.  Fatigue improved after surgery- seems to be recurring so we will check calcium  History of prostate cancer-follows up with urology regularly.    Spinal stenosis of lumbar region at multiple levels-continued issues with back pain.  Very sparing tramadol- 20 pills has lasted over 5 months- uses mainly tylenol . May give another #20 within next 6 months without visit.   Dyslipidemia-remains on statin.  Update lipid level today on atorvastatin 40mg  with goal LDL under 70  PSORIASIS-denies recent issues- not even using cream at present   Essential hypertension-controlled on amlodipine 5mg , aldactone 25mg  and losartan 100mg  and carvedilol- recently decreased by cardiology. Pt reports his BP has been good and it had been less than 140/90. Slightly elevated on initial check- normal on repeat   BP Readings from Last 3 Encounters:  12/17/18 130/62  12/06/18 (!) 102/52  09/14/18 120/62    B12 deficiency-update B12 level today-he is on b12 by mouth   Postherpetic neuralgia- rash has resolved. Still with pain  particularly fi gets cold.   Recommended Shingrix- he had this last year- for osme reason both are not loading- but he is firmly confident he had this     Recommended follow up: 6 months  Lab/Order associations:not fasting- small bowl of cereal and coffee with creamer   ICD-10-CM   1. Preventative health care  Z00.00 CBC    Comprehensive metabolic panel    Lipid panel    PSA    Vitamin B12    TSH  2. CAD S/P PCI  I25.10    Z98.61   3. Ischemic cardiomyopathy  I25.5   4. History of embolic stroke  123XX123   5. Primary hyperparathyroidism ([place])  E21.0   6. History of prostate cancer  Z85.46 PSA  7. Post herpetic neuralgia  B02.29   8. Spinal stenosis of lumbar region at multiple levels  M48.061   9. Dyslipidemia  E78.5 CBC    Comprehensive metabolic panel    Lipid panel  10. History of adenomatous polyp of colon  Z86.010   11. PSORIASIS  L40.8   12. Essential hypertension  I10   13. B12 deficiency  E53.8 Vitamin B12  14. Need for immunization against influenza  Z23 Flu Vaccine QUAD High Dose(Fluad)  15. Fatigue, unspecified type  R53.83 TSH    No orders of the defined types were placed in this encounter.   Return precautions advised.  Garret Reddish, MD

## 2018-12-17 ENCOUNTER — Other Ambulatory Visit: Payer: Self-pay

## 2018-12-17 ENCOUNTER — Ambulatory Visit (INDEPENDENT_AMBULATORY_CARE_PROVIDER_SITE_OTHER): Payer: Medicare Other | Admitting: Family Medicine

## 2018-12-17 ENCOUNTER — Encounter: Payer: Self-pay | Admitting: Family Medicine

## 2018-12-17 VITALS — BP 130/62 | HR 52 | Temp 98.1°F | Ht 72.0 in | Wt 212.2 lb

## 2018-12-17 DIAGNOSIS — E538 Deficiency of other specified B group vitamins: Secondary | ICD-10-CM | POA: Diagnosis not present

## 2018-12-17 DIAGNOSIS — B0229 Other postherpetic nervous system involvement: Secondary | ICD-10-CM

## 2018-12-17 DIAGNOSIS — Z0000 Encounter for general adult medical examination without abnormal findings: Secondary | ICD-10-CM

## 2018-12-17 DIAGNOSIS — I10 Essential (primary) hypertension: Secondary | ICD-10-CM

## 2018-12-17 DIAGNOSIS — L408 Other psoriasis: Secondary | ICD-10-CM

## 2018-12-17 DIAGNOSIS — I2510 Atherosclerotic heart disease of native coronary artery without angina pectoris: Secondary | ICD-10-CM

## 2018-12-17 DIAGNOSIS — E210 Primary hyperparathyroidism: Secondary | ICD-10-CM | POA: Diagnosis not present

## 2018-12-17 DIAGNOSIS — M48061 Spinal stenosis, lumbar region without neurogenic claudication: Secondary | ICD-10-CM

## 2018-12-17 DIAGNOSIS — I255 Ischemic cardiomyopathy: Secondary | ICD-10-CM | POA: Diagnosis not present

## 2018-12-17 DIAGNOSIS — Z23 Encounter for immunization: Secondary | ICD-10-CM

## 2018-12-17 DIAGNOSIS — Z8673 Personal history of transient ischemic attack (TIA), and cerebral infarction without residual deficits: Secondary | ICD-10-CM | POA: Diagnosis not present

## 2018-12-17 DIAGNOSIS — E785 Hyperlipidemia, unspecified: Secondary | ICD-10-CM

## 2018-12-17 DIAGNOSIS — Z860101 Personal history of adenomatous and serrated colon polyps: Secondary | ICD-10-CM

## 2018-12-17 DIAGNOSIS — Z86010 Personal history of colonic polyps: Secondary | ICD-10-CM

## 2018-12-17 DIAGNOSIS — R5383 Other fatigue: Secondary | ICD-10-CM | POA: Diagnosis not present

## 2018-12-17 DIAGNOSIS — Z8546 Personal history of malignant neoplasm of prostate: Secondary | ICD-10-CM

## 2018-12-17 DIAGNOSIS — Z9861 Coronary angioplasty status: Secondary | ICD-10-CM

## 2018-12-17 LAB — PSA: PSA: 0 ng/mL — ABNORMAL LOW (ref 0.10–4.00)

## 2018-12-17 LAB — COMPREHENSIVE METABOLIC PANEL
ALT: 9 U/L (ref 0–53)
AST: 10 U/L (ref 0–37)
Albumin: 3.7 g/dL (ref 3.5–5.2)
Alkaline Phosphatase: 74 U/L (ref 39–117)
BUN: 26 mg/dL — ABNORMAL HIGH (ref 6–23)
CO2: 30 mEq/L (ref 19–32)
Calcium: 9.1 mg/dL (ref 8.4–10.5)
Chloride: 103 mEq/L (ref 96–112)
Creatinine, Ser: 1.05 mg/dL (ref 0.40–1.50)
GFR: 67.53 mL/min (ref >60.00)
Glucose, Bld: 92 mg/dL (ref 70–99)
Potassium: 4.2 mEq/L (ref 3.5–5.1)
Sodium: 140 mEq/L (ref 135–145)
Total Bilirubin: 1.1 mg/dL (ref 0.2–1.2)
Total Protein: 6.1 g/dL (ref 6.0–8.3)

## 2018-12-17 LAB — CBC WITH DIFFERENTIAL/PLATELET
Basophils Absolute: 0 10*3/uL (ref 0.0–0.1)
Basophils Relative: 0.6 % (ref 0.0–3.0)
Eosinophils Absolute: 0.2 10*3/uL (ref 0.0–0.7)
Eosinophils Relative: 2.1 % (ref 0.0–5.0)
HCT: 37.3 % — ABNORMAL LOW (ref 39.0–52.0)
Hemoglobin: 12.4 g/dL — ABNORMAL LOW (ref 13.0–17.0)
Lymphocytes Relative: 11.6 % — ABNORMAL LOW (ref 12.0–46.0)
Lymphs Abs: 1 10*3/uL (ref 0.7–4.0)
MCHC: 33.3 g/dL (ref 30.0–36.0)
MCV: 97.5 fl (ref 78.0–100.0)
Monocytes Absolute: 1.1 10*3/uL — ABNORMAL HIGH (ref 0.1–1.0)
Monocytes Relative: 12.8 % — ABNORMAL HIGH (ref 3.0–12.0)
Neutro Abs: 6.1 10*3/uL (ref 1.4–7.7)
Neutrophils Relative %: 72.9 % (ref 43.0–77.0)
Platelets: 253 10*3/uL (ref 150.0–400.0)
RBC: 3.83 Mil/uL — ABNORMAL LOW (ref 4.22–5.81)
RDW: 14.1 % (ref 11.5–15.5)
WBC: 8.3 10*3/uL (ref 4.0–10.5)

## 2018-12-17 LAB — VITAMIN B12: Vitamin B-12: >1500 pg/mL — ABNORMAL HIGH (ref 211–911)

## 2018-12-17 LAB — LIPID PANEL
Cholesterol: 66 mg/dL (ref 0–200)
HDL: 29.7 mg/dL — ABNORMAL LOW (ref >39.00)
LDL Cholesterol: 26 mg/dL (ref 0–99)
NonHDL: 36.78
Total CHOL/HDL Ratio: 2
Triglycerides: 56 mg/dL (ref 0.0–149.0)
VLDL: 11.2 mg/dL (ref 0.0–40.0)

## 2018-12-17 LAB — TSH: TSH: 2.17 u[IU]/mL (ref 0.35–4.50)

## 2019-03-25 ENCOUNTER — Telehealth: Payer: Self-pay | Admitting: Family Medicine

## 2019-03-25 NOTE — Telephone Encounter (Signed)
I called the patient to schedule AWV-S with Loma Sousa ([place]), but his wife said that he's out running errands, and she will have him call back this afternoon.  If patient calls back, please schedule Medicare Wellness Visit at next available opening. Last AWV 05/07/17 VDM ([place])

## 2019-04-04 ENCOUNTER — Ambulatory Visit (INDEPENDENT_AMBULATORY_CARE_PROVIDER_SITE_OTHER): Payer: Medicare Other

## 2019-04-04 ENCOUNTER — Other Ambulatory Visit: Payer: Self-pay

## 2019-04-04 VITALS — BP 122/64 | HR 76 | Temp 97.1°F | Ht 72.0 in | Wt 213.0 lb

## 2019-04-04 DIAGNOSIS — Z0000 Encounter for general adult medical examination without abnormal findings: Secondary | ICD-10-CM | POA: Diagnosis not present

## 2019-04-04 MED ORDER — TRAMADOL HCL 50 MG PO TABS: 25.0000 mg | ORAL_TABLET | Freq: Four times a day (QID) | ORAL | 0 refills | Status: DC | PRN

## 2019-04-04 NOTE — Progress Notes (Signed)
Subjective:   Jose Ware is a 85 y.o. male who presents for Medicare Annual/Subsequent preventive examination.  Review of Systems:   Cardiac Risk Factors include: advanced age (>14men, >20 women);male gender;hypertension;dyslipidemia    Objective:    Vitals: BP 122/64   Pulse 76   Temp (!) 97.1 F (36.2 C) (Temporal)   Ht 6' (1.829 m)   Wt 213 lb (96.6 kg)   BMI 28.89 kg/m   Body mass index is 28.89 kg/m.  Advanced Directives 04/04/2019 05/07/2017 11/04/2015 11/04/2015 08/28/2015 08/07/2015 01/01/2015  Does Patient Have a Medical Advance Directive? Yes Yes No No Yes No;Yes Yes  Type of Advance Directive Living will;Healthcare Power of [place]  Does patient want to make changes to medical advance directive? No - Patient declined - - - - - -  Copy of [place] in Chart? No - copy requested - - - - - -  Would patient like information on creating a medical advance directive? - - No - patient declined information - - - -    Tobacco Social History   Tobacco Use  Smoking Status Former Smoker  . Packs/day: 2.00  . Years: 37.00  . Pack years: 74.00  . Quit date: 03/11/1983  . Years since quitting: 36.0  Smokeless Tobacco Never Used  Tobacco Comment   started smoking in the service; ICU 79' part of his stomach; appendix; coma      Counseling given: Not Answered Comment: started smoking in the service; ICU 85' part of his stomach; appendix; coma    Clinical Intake:  Pre-visit preparation completed: Yes  Pain : No/denies pain  Diabetes: No  How often do you need to have someone help you when you read instructions, pamphlets, or other written materials from your doctor or pharmacy?: 1 - Never  Interpreter Needed?: No  Information entered by :: Denman George LPN  Past Medical History:  Diagnosis Date  . Adenocarcinoma of prostate [facility])    XRT & Radiation seed  implantation in 2010  . Adenomatous colon polyp   . CAD (coronary artery disease)    a. 11/04/15:  Acute inferolateral STEMI: S/p emergent DES of a very large LCx with extensive thrombus. Significant residual disease in the proximal LAD and distal RCA. S/p staged PCI of LAD and RCA 8/29.  . Cataract   . CVA (cerebral infarction)    a. 123456: embolic CVA after heart cath   . CVA (cerebral infarction)    a. 123456: embolic CVA after heart cath   . DIVERTICULITIS, HX OF 11/27/2006       . DJD (degenerative joint disease)    wrist - R   . Hernia   . History of blood transfusion 1985   with colon surgery  . Hypertension   . Ischemic cardiomyopathy    a. EF 25-30% by LV gram on cath 11/04/15. Appeared out of proportion to infarct although infarct was large. EF improved by Echo and now 40-45%.  . Myocardial infarction ([place])   . NEPHROLITHIASIS, HX OF 11/27/2006        . Peripheral neuropathy   . Psoriasis   . Tenosynovitis    Past Surgical History:  Procedure Laterality Date  . APPENDECTOMY  1985  . BACK SURGERY  1980   minor disk surgery: instrumentation placed and removed in a second procedure  . CARDIAC CATHETERIZATION N/A 11/04/2015   Procedure: Left Heart  Cath and Coronary Angiography;  Surgeon: Peter M Martinique, MD;  Location: [place] [facility];  Service: Cardiovascular;  Laterality: N/A;  . CARDIAC CATHETERIZATION N/A 11/04/2015   Procedure: Coronary Stent Intervention;  Surgeon: Peter M Martinique, MD;  Location: [place] [facility];  Service: Cardiovascular;  Laterality: N/A;  . CARDIAC CATHETERIZATION N/A 11/06/2015   Procedure: Coronary Stent Intervention;  Surgeon: Leonie Man, MD;  Location: [place] [facility];  Service: Cardiovascular;  Laterality: N/A;  . CATARACT EXTRACTION, BILATERAL  2013  . CHOLECYSTECTOMY  1986  . COLON SURGERY  1980's   pt. had ileus, had colon surgery, requiring colostomy & then reversal & then dehisence of that wound & return to OR for repair &  cholecystectomy    . COLONOSCOPY    . COLOSTOMY  1985   After colectomy for diverticulitis, pt. remarks during this surgery they "gave me the paddles two times  because of bleeding", pt. states it was unrelated to any anesthesia complication  . EYE SURGERY     /w IOL  . KNEE ARTHROSCOPY Left 2003  . KNEE SURGERY  1956   Left 1956, Right w/cartilage removed later  . PARATHYROIDECTOMY N/A 05/12/2014   Procedure: PARATHYROIDECTOMY;  Surgeon: Armandina Gemma, MD;  Location: [place];  Service: General;  Laterality: N/A;  . REVERSAL OF COLOSTOMY  1987  . TONSILLECTOMY  1946  . WRIST SURGERY Left    Remote complicated w/infection   Family History  Problem Relation Age of Onset  . Coronary artery disease Mother   . Alcohol abuse Father   . Cirrhosis Father   . Heart failure Sister   . Breast cancer Sister        W/involvement of right arm leading to amputation  . Coronary artery disease Brother   . Heart attack Brother   . Clotting disorder Brother   . Prostate cancer Neg Hx   . Colon cancer Neg Hx   . Diabetes Neg Hx   . Glaucoma Neg Hx   . Rectal cancer Neg Hx   . Esophageal cancer Neg Hx    Social History   Socioeconomic History  . Marital status: Married    Spouse name: Not on file  . Number of children: 3  . Years of education: Not on file  . Highest education level: Not on file  Occupational History  . Not on file  Tobacco Use  . Smoking status: Former Smoker    Packs/day: 2.00    Years: 37.00    Pack years: 74.00    Quit date: 03/11/1983    Years since quitting: 36.0  . Smokeless tobacco: Never Used  . Tobacco comment: started smoking in the service; ICU 85' part of his stomach; appendix; coma   Substance and Sexual Activity  . Alcohol use: Yes    Alcohol/week: 0.0 standard drinks    Comment: RARE  . Drug use: No  . Sexual activity: Not on file  Other Topics Concern  . Not on file  Social History Narrative   [facility] [place], [place].   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 Difficulty of Paying Living Expenses: Not on file  Food Insecurity:   . Worried About Charity fundraiser in the Last Year: Not on file  . Ran Out of Food in the Last Year: Not on file  Transportation Needs:   . Lack of Transportation (Medical): Not on file  . Lack of Transportation (Non-Medical): Not on file  Physical Activity:   . Days of Exercise per Week: Not on file  . Minutes of Exercise per Session: Not on file  Stress:   . Feeling of Stress : Not on file  Social Connections:   . Frequency of Communication with Friends and Family: Not on file  . Frequency of Social Gatherings with Friends and Family: Not on file  . Attends Religious Services: Not on file  . Active Member of Clubs or Organizations: Not on file  . Attends Archivist Meetings: Not on file  . Marital Status: Not on file    Outpatient Encounter Medications as of 04/04/2019  Medication Sig  . acetaminophen (TYLENOL) 325 MG tablet Take 650 mg by mouth as needed.  Marland Kitchen amLODipine (NORVASC) 5 MG tablet Take 1 tablet (5 mg total) by mouth daily.  Marland Kitchen aspirin EC 81 MG tablet Take 1 tablet (81 mg total) by mouth daily.  Marland Kitchen atorvastatin (LIPITOR) 40 MG tablet Take 1 tablet (40 mg total) by mouth daily.  . beta carotene w/minerals (OCUVITE) tablet Take 1 tablet by mouth daily.  . carvedilol (COREG) 3.125 MG tablet Take 1 tablet (3.125 mg total) by mouth 2 (two) times daily.  . Cyanocobalamin (VITAMIN B-12) 5000 MCG TBDP Take 5,000 mcg by mouth daily.  Marland Kitchen losartan (COZAAR) 100 MG tablet Take 1 tablet (100 mg total) by mouth  daily.  . nitroGLYCERIN (NITROSTAT) 0.4 MG SL tablet Place 1 tablet (0.4 mg total) under the tongue every 5 (five) minutes x 3 doses as needed for chest pain.  Marland Kitchen spironolactone (ALDACTONE) 25 MG tablet Take 1 tablet (25 mg total) by mouth daily.  . traMADol (ULTRAM) 50 MG tablet Take 0.5-1 tablets (25-50 mg total) by mouth every 6 (six) hours as needed for moderate pain or severe pain.   No facility-administered encounter medications on file as of 04/04/2019.    Activities of Daily Living In your present state of health, do you have any difficulty performing the following activities: 04/04/2019  Hearing? N  Vision? N  Difficulty concentrating or making decisions? N  Walking or climbing stairs? Y  Comment at times due to imbalance  Dressing or bathing? N  Doing errands, shopping? N  Preparing Food and eating ? N  Using the Toilet? N  In the past six months, have you accidently leaked urine? N  Do you have problems with loss of bowel control? N  Managing your Medications? N  Managing your Finances? N  Housekeeping or managing your Housekeeping? N  Some recent data might be hidden    Patient Care Team: [name], MD as PCP - General (Family Medicine) Lindwood Coke, MD (Dermatology) Lowella Bandy, MD (Urology) Ladene Artist, MD (Gastroenterology) Martinique, Peter M, MD as Consulting Physician (Cardiology) Keene Breath., MD as Consulting Physician (Ophthalmology)   Assessment:   This is a routine wellness examination for Jose Ware.  Exercise Activities and Dietary recommendations Current Exercise Habits: The patient does not participate in regular exercise at present  Goals    . Patient Stated  Continue with your exercise        Fall Risk Fall Risk  04/04/2019 12/17/2018 07/15/2018 05/07/2017 02/05/2017  Falls in the past year? 0 0 1 No No  Number falls in past yr: 0 0 - - -  Injury with Fall? 0 0 - - -  Risk for fall due to : Impaired balance/gait;Impaired mobility - -  - -  Follow up Education provided;Falls prevention discussed;Falls evaluation completed - - - -   Is the patient's home free of loose throw rugs in walkways, pet beds, electrical cords, etc?   yes      Grab bars in the bathroom? yes      Handrails on the stairs?   yes      Adequate lighting?   yes  Timed Get Up and Go Performed: completed and within normal timeframe; no gait abnormalities noted    Depression Screen PHQ 2/9 Scores 04/04/2019 12/17/2018 05/11/2018 05/07/2017  PHQ - 2 Score 0 0 0 0    Cognitive Function MMSE - Mini Mental State Exam 05/07/2017  Not completed: (No Data)     6CIT Screen 04/04/2019  What Year? 0 points  What month? 0 points  What time? 0 points  Count back from 20 0 points  Months in reverse 0 points  Repeat phrase 0 points  Total Score 0    Immunization History  Administered Date(s) Administered  . Fluad Quad(high Dose 65+) 12/17/2018  . Influenza Split 01/06/2012  . Influenza Whole 12/19/2008, 12/27/2009  . Influenza, High Dose Seasonal PF 12/21/2012, 12/22/2014, 02/05/2017, 12/08/2017  . Influenza,inj,Quad PF,6+ Mos 12/01/2013, 10/24/2015  . Pneumococcal Conjugate-13 09/14/2015  . Pneumococcal Polysaccharide-23 02/12/2009  . Td 03/12/2003  . Tdap 02/27/2014  . Zoster Recombinat (Shingrix) 11/24/2017    Qualifies for Shingles Vaccine? Shingrix completed per patient; will request records from pharmacy   Screening Tests Health Maintenance  Topic Date Due  . COLONOSCOPY  12/16/2018  . TETANUS/TDAP  02/28/2024  . INFLUENZA VACCINE  Completed  . PNA vac Low Risk Adult  Completed   Cancer Screenings: Lung: Low Dose CT Chest recommended if Age 92-80 years, 30 pack-year currently smoking OR have quit w/in 15years. Patient does not qualify. Colorectal: colonoscopy 12/15/17; repeat due 2020 but deferred due to problems with insurance coverage   Plan:    I have personally reviewed and addressed the Medicare Annual Wellness questionnaire and have  noted the following in the patient's chart:  A. Medical and social history B. Use of alcohol, tobacco or illicit drugs  C. Current medications and supplements D. Functional ability and status E.  Nutritional status F.  Physical activity G. Advance directives H. List of other physicians I.  Hospitalizations, surgeries, and ER visits in previous 12 months J.  Talent such as hearing and vision if needed, cognitive and depression L. Referrals, records requested, and appointments- will request immunization records from pharmacy   In addition, I have reviewed and discussed with patient certain preventive protocols, quality metrics, and best practice recommendations. A written personalized care plan for preventive services as well as general preventive health recommendations were provided to patient.   Signed,  Denman George, LPN  Nurse Health Advisor   Nurse Notes: Patient has complaints of increase in intermittent back pain.  States that he believes this can be attributed to no longer going to [facility].  Would like to know if he can have a refill of Tramadol or if there are any other recommendations.

## 2019-04-04 NOTE — Addendum Note (Signed)
Addended by: [name] on: 04/04/2019 01:05 PM   Modules accepted: Orders

## 2019-04-04 NOTE — Patient Instructions (Addendum)
[name] , Thank you for taking time to come for your Medicare Wellness Visit. I appreciate your ongoing commitment to your health goals. Please review the following plan we discussed and let me know if I can assist you in the future.   Screening recommendations/referrals: Colorectal Screening: last colonoscopy 12/15/17 repeat recommended in 1 year   Vision and Dental Exams: Recommended annual ophthalmology exams for early detection of glaucoma and other disorders of the eye Recommended annual dental exams for proper oral hygiene  Vaccinations: Influenza vaccine: completed 12/17/18 Pneumococcal vaccine: up to date; last 09/14/15 Tdap vaccine: up to date; last 02/27/14  Shingles vaccine: Shingrix completed ( I will request records from the pharmacy )   Advanced directives: Please bring a copy of your POA (Power of [place]) and/or Living Will to your next appointment.  Goals: Recommend to drink at least 6-8 8oz glasses of water per day and consume a balanced diet rich in fresh fruits and vegetables.   Next appointment: Please schedule your Annual Wellness Visit with your Nurse Health Advisor in one year.  Preventive Care 70 Years and Older, Male Preventive care refers to lifestyle choices and visits with your health care provider that can promote health and wellness. What does preventive care include?  A yearly physical exam. This is also called an annual well check.  Dental exams once or twice a year.  Routine eye exams. Ask your health care provider how often you should have your eyes checked.  Personal lifestyle choices, including:  Daily care of your teeth and gums.  Regular physical activity.  Eating a healthy diet.  Avoiding tobacco and drug use.  Limiting alcohol use.  Practicing safe sex.  Taking low doses of aspirin every day if recommended by your health care provider..  Taking vitamin and mineral supplements as recommended by your health care provider. What  happens during an annual well check? The services and screenings done by your health care provider during your annual well check will depend on your age, overall health, lifestyle risk factors, and family history of disease. Counseling  Your health care provider may ask you questions about your:  Alcohol use.  Tobacco use.  Drug use.  Emotional well-being.  Home and relationship well-being.  Sexual activity.  Eating habits.  History of falls.  Memory and ability to understand (cognition).  Work and work Statistician. Screening  You may have the following tests or measurements:  Height, weight, and BMI.  Blood pressure.  Lipid and cholesterol levels. These may be checked every 5 years, or more frequently if you are over 98 years old.  Skin check.  Lung cancer screening. You may have this screening every year starting at age 58 if you have a 30-pack-year history of smoking and currently smoke or have quit within the past 15 years.  Fecal occult blood test (FOBT) of the stool. You may have this test every year starting at age 65.  Flexible sigmoidoscopy or colonoscopy. You may have a sigmoidoscopy every 5 years or a colonoscopy every 10 years starting at age 75.  Prostate cancer screening. Recommendations will vary depending on your family history and other risks.  Hepatitis C blood test.  Hepatitis B blood test.  Sexually transmitted disease (STD) testing.  Diabetes screening. This is done by checking your blood sugar (glucose) after you have not eaten for a while (fasting). You may have this done every 1-3 years.  Abdominal aortic aneurysm (AAA) screening. You may need this if you are  a current or former smoker.  Osteoporosis. You may be screened starting at age 75 if you are at high risk. Talk with your health care provider about your test results, treatment options, and if necessary, the need for more tests. Vaccines  Your health care provider may recommend  certain vaccines, such as:  Influenza vaccine. This is recommended every year.  Tetanus, diphtheria, and acellular pertussis (Tdap, Td) vaccine. You may need a Td booster every 10 years.  Zoster vaccine. You may need this after age 93.  Pneumococcal 13-valent conjugate (PCV13) vaccine. One dose is recommended after age 56.  Pneumococcal polysaccharide (PPSV23) vaccine. One dose is recommended after age 8. Talk to your health care provider about which screenings and vaccines you need and how often you need them. This information is not intended to replace advice given to you by your health care provider. Make sure you discuss any questions you have with your health care provider. Document Released: 03/23/2015 Document Revised: 11/14/2015 Document Reviewed: 12/26/2014 Elsevier Interactive Patient Education  2017 [place] Prevention in the Home Falls can cause injuries. They can happen to people of all ages. There are many things you can do to make your home safe and to help prevent falls. What can I do on the outside of my home?  Regularly fix the edges of walkways and driveways and fix any cracks.  Remove anything that might make you trip as you walk through a door, such as a raised step or threshold.  Trim any bushes or trees on the path to your home.  Use bright outdoor lighting.  Clear any walking paths of anything that might make someone trip, such as rocks or tools.  Regularly check to see if handrails are loose or broken. Make sure that both sides of any steps have handrails.  Any raised decks and porches should have guardrails on the edges.  Have any leaves, snow, or ice cleared regularly.  Use sand or salt on walking paths during winter.  Clean up any spills in your garage right away. This includes oil or grease spills. What can I do in the bathroom?  Use night lights.  Install grab bars by the toilet and in the tub and shower. Do not use towel bars as  grab bars.  Use non-skid mats or decals in the tub or shower.  If you need to sit down in the shower, use a plastic, non-slip stool.  Keep the floor dry. Clean up any water that spills on the floor as soon as it happens.  Remove soap buildup in the tub or shower regularly.  Attach bath mats securely with double-sided non-slip rug tape.  Do not have throw rugs and other things on the floor that can make you trip. What can I do in the bedroom?  Use night lights.  Make sure that you have a light by your bed that is easy to reach.  Do not use any sheets or blankets that are too big for your bed. They should not hang down onto the floor.  Have a firm chair that has side arms. You can use this for support while you get dressed.  Do not have throw rugs and other things on the floor that can make you trip. What can I do in the kitchen?  Clean up any spills right away.  Avoid walking on wet floors.  Keep items that you use a lot in easy-to-reach places.  If you need to reach something  above you, use a strong step stool that has a grab bar.  Keep electrical cords out of the way.  Do not use floor polish or wax that makes floors slippery. If you must use wax, use non-skid floor wax.  Do not have throw rugs and other things on the floor that can make you trip. What can I do with my stairs?  Do not leave any items on the stairs.  Make sure that there are handrails on both sides of the stairs and use them. Fix handrails that are broken or loose. Make sure that handrails are as long as the stairways.  Check any carpeting to make sure that it is firmly attached to the stairs. Fix any carpet that is loose or worn.  Avoid having throw rugs at the top or bottom of the stairs. If you do have throw rugs, attach them to the floor with carpet tape.  Make sure that you have a light switch at the top of the stairs and the bottom of the stairs. If you do not have them, ask someone to add them  for you. What else can I do to help prevent falls?  Wear shoes that:  Do not have high heels.  Have rubber bottoms.  Are comfortable and fit you well.  Are closed at the toe. Do not wear sandals.  If you use a stepladder:  Make sure that it is fully opened. Do not climb a closed stepladder.  Make sure that both sides of the stepladder are locked into place.  Ask someone to hold it for you, if possible.  Clearly mark and make sure that you can see:  Any grab bars or handrails.  First and last steps.  Where the edge of each step is.  Use tools that help you move around (mobility aids) if they are needed. These include:  Canes.  Walkers.  Scooters.  Crutches.  Turn on the lights when you go into a dark area. Replace any light bulbs as soon as they burn out.  Set up your furniture so you have a clear path. Avoid moving your furniture around.  If any of your floors are uneven, fix them.  If there are any pets around you, be aware of where they are.  Review your medicines with your doctor. Some medicines can make you feel dizzy. This can increase your chance of falling. Ask your doctor what other things that you can do to help prevent falls. This information is not intended to replace advice given to you by your health care provider. Make sure you discuss any questions you have with your health care provider. Document Released: 12/21/2008 Document Revised: 08/02/2015 Document Reviewed: 03/31/2014 Elsevier Interactive Patient Education  2017 [employer].

## 2019-05-05 ENCOUNTER — Encounter: Payer: Self-pay | Admitting: Family Medicine

## 2019-05-05 NOTE — Telephone Encounter (Signed)
Please contact pt to schedule acute visit with first available provider. Thanks!

## 2019-05-06 ENCOUNTER — Other Ambulatory Visit: Payer: Self-pay

## 2019-05-06 ENCOUNTER — Encounter: Payer: Self-pay | Admitting: Physician Assistant

## 2019-05-06 ENCOUNTER — Ambulatory Visit (INDEPENDENT_AMBULATORY_CARE_PROVIDER_SITE_OTHER): Payer: Medicare Other | Admitting: Physician Assistant

## 2019-05-06 VITALS — BP 110/60 | HR 52 | Temp 97.3°F | Ht 72.0 in | Wt 215.0 lb

## 2019-05-06 DIAGNOSIS — R21 Rash and other nonspecific skin eruption: Secondary | ICD-10-CM | POA: Diagnosis not present

## 2019-05-06 MED ORDER — TRIAMCINOLONE ACETONIDE 0.1 % EX CREA: TOPICAL_CREAM | CUTANEOUS | 0 refills | Status: DC

## 2019-05-06 MED ORDER — CEPHALEXIN 500 MG PO CAPS: 500.0000 mg | ORAL_CAPSULE | Freq: Three times a day (TID) | ORAL | 0 refills | Status: AC

## 2019-05-06 NOTE — Progress Notes (Signed)
Jose Ware is a 85 y.o. male here for a new problem.  I acted as a Education administrator for [employer], PA-C Anselmo Pickler, LPN  History of Present Illness:   Chief Complaint  Patient presents with  . Rash    HPI    Rash Pt c/o rash on both ankles, started almost 2 weeks ago. Right worse than left. Ankles are itching, red and burns at night. Has tried Cortisone cream but did not help.  He has had this rash before in the past he is triamcinolone 0.1% with good relief of symptoms.  He states that the area sometimes is weeping clear fluid.  Overall his symptoms are getting worse with time.  He does have a history of cellulitis in his bilateral ankles and wants to make sure that this is not heading towards that.  He denies fever, chills, malaise, nausea, vomiting.  Of note he does report that he has been to the dermatologist in the past for this rash and was told that they did not know what it was from.  Past Medical History:  Diagnosis Date  . Adenocarcinoma of prostate [facility])    XRT & Radiation seed implantation in 2010  . Adenomatous colon polyp   . CAD (coronary artery disease)    a. 11/04/15:  Acute inferolateral STEMI: S/p emergent DES of a very large LCx with extensive thrombus. Significant residual disease in the proximal LAD and distal RCA. S/p staged PCI of LAD and RCA 8/29.  . Cataract   . CVA (cerebral infarction)    a. 123456: embolic CVA after heart cath   . CVA (cerebral infarction)    a. 123456: embolic CVA after heart cath   . DIVERTICULITIS, HX OF 11/27/2006       . DJD (degenerative joint disease)    wrist - R   . Hernia   . History of blood transfusion 1985   with colon surgery  . Hypertension   . Ischemic cardiomyopathy    a. EF 25-30% by LV gram on cath 11/04/15. Appeared out of proportion to infarct although infarct was large. EF improved by Echo and now 40-45%.  . Myocardial infarction ([place])   . NEPHROLITHIASIS, HX OF 11/27/2006        . Peripheral  neuropathy   . Psoriasis   . Tenosynovitis      Social History   Socioeconomic History  . Marital status: Married    Spouse name: Not on file  . Number of children: 3  . Years of education: Not on file  . Highest education level: Not on file  Occupational History  . Not on file  Tobacco Use  . Smoking status: Former Smoker    Packs/day: 2.00    Years: 37.00    Pack years: 74.00    Quit date: 03/11/1983    Years since quitting: 36.1  . Smokeless tobacco: Never Used  . Tobacco comment: started smoking in the service; ICU 85' part of his stomach; appendix; coma   Substance and Sexual Activity  . Alcohol use: Yes    Alcohol/week: 0.0 standard drinks    Comment: RARE  . Drug use: No  . Sexual activity: Not on file  Other Topics Concern  . Not on file  Social History Narrative   [facility] [place], [place].   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 Difficulty of Paying Living Expenses: Not on file  Food Insecurity:   . Worried About Charity fundraiser in the Last Year: Not on file  . Ran Out of Food in the Last Year: Not on file  Transportation Needs:   . Lack of Transportation (Medical): Not on file  . Lack of Transportation (Non-Medical): Not on file  Physical Activity:   . Days of Exercise per Week: Not on file  . Minutes of Exercise per Session: Not on file  Stress:   . Feeling of Stress : Not on file  Social Connections:   . Frequency of Communication with Friends and Family: Not on file  . Frequency of Social Gatherings with Friends and Family: Not on file  . Attends Religious Services:  Not on file  . Active Member of Clubs or Organizations: Not on file  . Attends Archivist Meetings: Not on file  . Marital Status: Not on file  Intimate Partner Violence:   . Fear of Current or Ex-Partner: Not on file  . Emotionally Abused: Not on file  . Physically Abused: Not on file  . Sexually Abused: Not on file    Past Surgical History:  Procedure Laterality Date  . APPENDECTOMY  1985  . BACK SURGERY  1980   minor disk surgery: instrumentation placed and removed in a second procedure  . CARDIAC CATHETERIZATION N/A 11/04/2015   Procedure: Left Heart Cath and Coronary Angiography;  Surgeon: Peter M Martinique, MD;  Location: [place] [facility];  Service: Cardiovascular;  Laterality: N/A;  . CARDIAC CATHETERIZATION N/A 11/04/2015   Procedure: Coronary Stent Intervention;  Surgeon: Peter M Martinique, MD;  Location: [place] [facility];  Service: Cardiovascular;  Laterality: N/A;  . CARDIAC CATHETERIZATION N/A 11/06/2015   Procedure: Coronary Stent Intervention;  Surgeon: Leonie Man, MD;  Location: [facility];  Service: Cardiovascular;  Laterality: N/A;  . CATARACT EXTRACTION, BILATERAL  2013  . CHOLECYSTECTOMY  1986  . COLON SURGERY  1980's   pt. had ileus, had colon surgery, requiring colostomy & then reversal & then dehisence of that wound & return to OR for repair & cholecystectomy    . COLONOSCOPY    . COLOSTOMY  1985   After colectomy for diverticulitis, pt. remarks during this surgery they "gave me the paddles two times  because of bleeding", pt. states it was unrelated to any anesthesia complication  . EYE SURGERY     /w IOL  . KNEE ARTHROSCOPY Left 2003  . KNEE SURGERY  1956   Left 1956, Right w/cartilage removed later  . PARATHYROIDECTOMY N/A 05/12/2014   Procedure: PARATHYROIDECTOMY;  Surgeon: Armandina Gemma, MD;  Location: [place] [facility];  Service: General;  Laterality: N/A;  . REVERSAL OF COLOSTOMY  1987  . TONSILLECTOMY  1946  . WRIST SURGERY Left    Remote  complicated w/infection    Family History  Problem Relation Age of Onset  . Coronary artery disease Mother   . Alcohol abuse Father   . Cirrhosis Father   . Heart failure Sister   . Breast cancer Sister  W/involvement of right arm leading to amputation  . Coronary artery disease Brother   . Heart attack Brother   . Clotting disorder Brother   . Prostate cancer Neg Hx   . Colon cancer Neg Hx   . Diabetes Neg Hx   . Glaucoma Neg Hx   . Rectal cancer Neg Hx   . Esophageal cancer Neg Hx     Allergies  Allergen Reactions  . Bee Venom Swelling    Facial swelling  . Betadine [Povidone Iodine] Other (See Comments)    blistering  . Doxycycline     Possible drug rash- back of right lower leg and onto left lower leg as well    Current Medications:   Current Outpatient Medications:  .  acetaminophen (TYLENOL) 325 MG tablet, Take 650 mg by mouth as needed., Disp: , Rfl:  .  amLODipine (NORVASC) 5 MG tablet, Take 1 tablet (5 mg total) by mouth daily., Disp: 90 tablet, Rfl: 3 .  aspirin EC 81 MG tablet, Take 1 tablet (81 mg total) by mouth daily., Disp: 90 tablet, Rfl: 1 .  atorvastatin (LIPITOR) 40 MG tablet, Take 1 tablet (40 mg total) by mouth daily., Disp: 90 tablet, Rfl: 3 .  beta carotene w/minerals (OCUVITE) tablet, Take 1 tablet by mouth daily., Disp: , Rfl:  .  carvedilol (COREG) 3.125 MG tablet, Take 1 tablet (3.125 mg total) by mouth 2 (two) times daily., Disp: 180 tablet, Rfl: 3 .  Cyanocobalamin (VITAMIN B-12) 5000 MCG TBDP, Take 5,000 mcg by mouth daily., Disp: , Rfl:  .  losartan (COZAAR) 100 MG tablet, Take 1 tablet (100 mg total) by mouth daily., Disp: 90 tablet, Rfl: 3 .  nitroGLYCERIN (NITROSTAT) 0.4 MG SL tablet, Place 1 tablet (0.4 mg total) under the tongue every 5 (five) minutes x 3 doses as needed for chest pain., Disp: 25 tablet, Rfl: 2 .  spironolactone (ALDACTONE) 25 MG tablet, Take 1 tablet (25 mg total) by mouth daily., Disp: 90 tablet, Rfl: 3 .   traMADol (ULTRAM) 50 MG tablet, Take 0.5-1 tablets (25-50 mg total) by mouth every 6 (six) hours as needed for moderate pain or severe pain., Disp: 20 tablet, Rfl: 0 .  cephALEXin (KEFLEX) 500 MG capsule, Take 1 capsule (500 mg total) by mouth 3 (three) times daily for 5 days., Disp: 15 capsule, Rfl: 0 .  triamcinolone cream (KENALOG) 0.1 %, Apply to affected area 1-2 times daily, Disp: 30 g, Rfl: 0   Review of Systems:   ROS Negative unless otherwise specified per HPI.  Vitals:   Vitals:   05/06/19 1257  BP: 110/60  Pulse: (!) 52  Temp: (!) 97.3 F (36.3 C)  TempSrc: Temporal  SpO2: 98%  Weight: 215 lb (97.5 kg)  Height: 6' (1.829 m)     Body mass index is 29.16 kg/m.  Physical Exam:   Physical Exam Vitals and nursing note reviewed.  Constitutional:      Appearance: He is well-developed.  HENT:     Head: Normocephalic.  Eyes:     Conjunctiva/sclera: Conjunctivae normal.     Pupils: Pupils are equal, round, and reactive to light.  Pulmonary:     Effort: Pulmonary effort is normal.  Musculoskeletal:        General: Normal range of motion.     Cervical back: Normal range of motion.  Skin:    General: Skin is warm and dry.     Comments: Right posterior ankle with significant scattered erythematous lesions with  scabbing, no tenderness to touch or swelling or warmth noticeable.  Left posterior ankle with similar rash but much less significant.    Both areas have no drainage present.  No streaking or surrounding redness to foot or calf.  Neurological:     Mental Status: He is alert and oriented to person, place, and time.  Psychiatric:        Behavior: Behavior normal.        Thought Content: Thought content normal.        Judgment: Judgment normal.     Assessment and Plan:   Ezechiel was seen today for rash.  Diagnoses and all orders for this visit:  Rash  Other orders -     triamcinolone cream (KENALOG) 0.1 %; Apply to affected area 1-2 times daily -      cephALEXin (KEFLEX) 500 MG capsule; Take 1 capsule (500 mg total) by mouth 3 (three) times daily for 5 days.   Unclear etiology, no evidence of infection on exam today.  Will trial Kenalog 0.1% ointment 1-2 times daily.  I did give him a safety net prescription of Keflex to use if signs or symptoms of worsening rash develop.  Recommended close follow-up in office next week if no improvement of symptoms.  . Reviewed expectations re: course of current medical issues. . Discussed self-management of symptoms. . Outlined signs and symptoms indicating need for more acute intervention. . Patient verbalized understanding and all questions were answered. . See orders for this visit as documented in the electronic medical record. . Patient received an After-Visit Summary.  CMA or LPN served as scribe during this visit. History, Physical, and Plan performed by medical provider. The above documentation has been reviewed and is accurate and complete.   Inda Coke, PA-C

## 2019-05-06 NOTE — Patient Instructions (Signed)
It was great to see you!  Use the cream that has been sent in for your rash.  If you feel like the area is worsening or starting to get infected, start the oral antibiotic and follow-up with [place] next week.  Take care,  Inda Coke PA-C

## 2019-05-09 ENCOUNTER — Encounter: Payer: Self-pay | Admitting: Family Medicine

## 2019-05-18 NOTE — Telephone Encounter (Signed)
Patient has picked form up.

## 2019-06-04 NOTE — Progress Notes (Signed)
06/09/2019 Jose Ware   January 04, 1935  PC:[id number]  Primary Physician Jose Channel Brayton Mars, MD Primary Cardiologist: Dr Jose Ware  HPI:  Jose Ware is seen for follow up CAD. He has a history of HTN, remote tobacco abuse, psoriasis, prostate cancer s/p XRT and radiation seed therapy, adenomatous colon polyps and diverticulitis s/p previous abdominal surgeries.  He presented to [facility] on 11/04/15 as a inferolateral STEMI. Emergent cath revealed a very large LCx with extensive thrombus. He underwent PCI with DES. With reperfusion he had NSVT. The pt also had residual RCA and LAD disease and an EF of 25% at cath. Echo done 11/05/15 showed an EF of 40-45%. His Troponin peaked at 25. On 11/06/15 he was taken back to the lab for staged PCI to his LAD and RCA. This was successful but on 11/07/15 the pt related that he developed some word finding issues that started post PCI 8/29. MRI revealed scattered small acute infarcts in the bilateral anterior and posterior circulation.  CA dopplers revealed mild plaque (1-39%). The pt's symptoms were improving and no further therapy was recommended.    On follow up today he reports he is doing well. He denies any chest pain or SOB. He is anxious to get back in the gym. He is walking regularly. Tolerating medication well. Pulse typically 58. He has lost 8 lbs.   Current Outpatient Medications  Medication Sig Dispense Refill  . acetaminophen (TYLENOL) 325 MG tablet Take 650 mg by mouth as needed.    Marland Kitchen amLODipine (NORVASC) 5 MG tablet Take 1 tablet (5 mg total) by mouth daily. 90 tablet 3  . aspirin EC 81 MG tablet Take 1 tablet (81 mg total) by mouth daily. 90 tablet 1  . atorvastatin (LIPITOR) 40 MG tablet Take 1 tablet (40 mg total) by mouth daily. 90 tablet 3  . beta carotene w/minerals (OCUVITE) tablet Take 1 tablet by mouth daily.    . carvedilol (COREG) 3.125 MG tablet Take 1 tablet (3.125 mg total) by mouth 2 (two) times daily. 180 tablet 3  . Cyanocobalamin  (VITAMIN B-12) 5000 MCG TBDP Take 5,000 mcg by mouth daily.    Marland Kitchen losartan (COZAAR) 100 MG tablet Take 1 tablet (100 mg total) by mouth daily. 90 tablet 3  . nitroGLYCERIN (NITROSTAT) 0.4 MG SL tablet Place 1 tablet (0.4 mg total) under the tongue every 5 (five) minutes x 3 doses as needed for chest pain. 25 tablet 2  . spironolactone (ALDACTONE) 25 MG tablet Take 1 tablet (25 mg total) by mouth daily. 90 tablet 3   No current facility-administered medications for this visit.    Allergies  Allergen Reactions  . Bee Venom Swelling    Facial swelling  . Betadine [Povidone Iodine] Other (See Comments)    blistering  . Doxycycline     Possible drug rash- back of right lower leg and onto left lower leg as well    Social History   Socioeconomic History  . Marital status: Married    Spouse name: Not on file  . Number of children: 3  . Years of education: Not on file  . Highest education level: Not on file  Occupational History  . Not on file  Tobacco Use  . Smoking status: Former Smoker    Packs/day: 2.00    Years: 37.00    Pack years: 74.00    Quit date: 03/11/1983    Years since quitting: 36.2  . Smokeless tobacco: Never Used  . Tobacco comment:  started smoking in the service; ICU 85' part of his stomach; appendix; coma   Substance and Sexual Activity  . Alcohol use: Yes    Alcohol/week: 0.0 standard drinks    Comment: RARE  . Drug use: No  . Sexual activity: Not on file  Other Topics Concern  . Not on file  Social History Narrative   [facility] [place], [place].   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 Difficulty of Paying Living Expenses:   Food Insecurity:   . Worried About Charity fundraiser in the Last Year:   . Arboriculturist in the Last Year:   Transportation Needs:   . Film/video editor (Medical):   Marland Kitchen Lack of Transportation (Non-Medical):   Physical Activity:   . Days of Exercise per Week:   . Minutes of Exercise per Session:   Stress:   . Feeling of Stress :   Social Connections:   . Frequency of Communication with Friends and Family:   . Frequency of Social Gatherings with Friends and Family:   . Attends Religious Services:   . Active Member of Clubs or Organizations:   . Attends Archivist Meetings:   Marland Kitchen Marital Status:   Intimate Partner Violence:   . Fear of Current or Ex-Partner:   . Emotionally Abused:   Marland Kitchen Physically Abused:   . Sexually Abused:      Review of Systems: As noted in HPI.  All other systems reviewed and are otherwise negative except as noted above.  Blood pressure 132/62, pulse 60, height 6\' 1"  (1.854 m), weight 207 lb 9.6 oz (94.2 kg), SpO2 96 %.  GENERAL:  Well appearing WM in NAD HEENT:  PERRL, EOMI, sclera are clear. Oropharynx is clear. NECK:  No jugular venous distention, carotid upstroke brisk and symmetric, no bruits, no thyromegaly or adenopathy LUNGS:  Clear to auscultation bilaterally CHEST:  Unremarkable HEART:  RRR,  PMI not displaced or sustained,S1 and S2 within normal limits, no S3, no S4: no clicks, no rubs, no murmurs ABD:  Soft, nontender. BS +, no masses or bruits. No hepatomegaly, no splenomegaly EXT:  2 + pulses throughout, no edema, no cyanosis no clubbing SKIN:  Warm and dry.  No rashes NEURO:  Alert and oriented x 3. Cranial nerves II through XII intact. PSYCH:  Cognitively intact   Laboratory data:  Lab Results  Component Value Date   WBC 8.3 12/17/2018   HGB 12.4 (L) 12/17/2018   HCT 37.3 (L) 12/17/2018   PLT 253.0 12/17/2018   GLUCOSE 92 12/17/2018   CHOL 66 12/17/2018   TRIG  56.0 12/17/2018   HDL 29.70 (L) 12/17/2018   LDLDIRECT 31.0 11/06/2017   LDLCALC 26 12/17/2018   ALT 9 12/17/2018   AST 10 12/17/2018   NA 140 12/17/2018   K 4.2 12/17/2018   CL 103 12/17/2018   CREATININE 1.05 12/17/2018   BUN 26 (H) 12/17/2018   CO2 30 12/17/2018   TSH 2.17 12/17/2018  PSA 0.00 (L) 12/17/2018   INR 1.08 11/04/2015   HGBA1C 5.3 11/04/2015     ASSESSMENT AND PLAN:  1. CAD s/p inferolateral STEMI in August 2017 with thrombotic occlusion of the LCx. S/p DES. Subsequent staged PCI of the proximal LAD and RCA. He has no anginal symptoms. Continue ASA. Continue low dose Coreg. Dose limited by bradycardia.  2. Ischemic LV dysfunction. Asymptomatic. On Coreg. Losartan, aldactone. No overt evidence of  CHF. 3. 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 No residual symptoms.  4. Hypertension. Well controlled now.  5. Hyperlipidemia. On high dose statin with excellent results.   PLAN   Follow up in 6 months.  Peter Martinique MD,FACC 06/09/2019 3:05 PM

## 2019-06-06 DIAGNOSIS — M25562 Pain in left knee: Secondary | ICD-10-CM | POA: Insufficient documentation

## 2019-06-08 DIAGNOSIS — M79605 Pain in left leg: Secondary | ICD-10-CM | POA: Insufficient documentation

## 2019-06-08 DIAGNOSIS — M549 Dorsalgia, unspecified: Secondary | ICD-10-CM | POA: Insufficient documentation

## 2019-06-09 ENCOUNTER — Ambulatory Visit: Payer: Medicare Other | Admitting: Cardiology

## 2019-06-09 ENCOUNTER — Encounter: Payer: Self-pay | Admitting: Cardiology

## 2019-06-09 ENCOUNTER — Other Ambulatory Visit: Payer: Self-pay

## 2019-06-09 VITALS — BP 132/62 | HR 60 | Ht 73.0 in | Wt 207.6 lb

## 2019-06-09 DIAGNOSIS — I2510 Atherosclerotic heart disease of native coronary artery without angina pectoris: Secondary | ICD-10-CM

## 2019-06-09 DIAGNOSIS — I10 Essential (primary) hypertension: Secondary | ICD-10-CM | POA: Diagnosis not present

## 2019-06-09 DIAGNOSIS — E785 Hyperlipidemia, unspecified: Secondary | ICD-10-CM | POA: Diagnosis not present

## 2019-06-09 DIAGNOSIS — I5022 Chronic systolic (congestive) heart failure: Secondary | ICD-10-CM

## 2019-06-17 NOTE — Progress Notes (Signed)
Phone [phone] In person visit   Subjective:   Jose Ware is a 85 y.o. year old very pleasant male patient who presents for/with See problem oriented charting Chief Complaint  Patient presents with  . Hypertension    This visit occurred during the SARS-CoV-2 public health emergency.  Safety protocols were in place, including screening questions prior to the visit, additional usage of staff PPE, and extensive cleaning of exam room while observing appropriate contact time as indicated for disinfecting solutions.   Past Medical History-  Patient Active Problem List   Diagnosis Date Noted  . CAD S/P PCI     Priority: High  . Ischemic cardiomyopathy     Priority: High  . History of embolic stroke     Priority: High  . Primary hyperparathyroidism ([place]) 12/28/2013    Priority: High  . Fatigue 12/01/2013    Priority: High  . History of prostate cancer 12/14/2008    Priority: High  . Post herpetic neuralgia 06/20/2017    Priority: Medium  . Spinal stenosis of lumbar region at multiple levels 04/30/2017    Priority: Medium  . Dyslipidemia 11/16/2015    Priority: Medium  . History of adenomatous polyp of colon 01/18/2015    Priority: Medium  . PSORIASIS 10/15/2007    Priority: Medium  . Essential hypertension 11/27/2006    Priority: Medium  . Drug reaction 02/05/2017    Priority: Low  . Senile purpura ([place]) 02/05/2017    Priority: Low  . History of ventricular tachycardia 11/08/2015    Priority: Low  . History of stomach cancer 12/01/2013    Priority: Low  . Lower back pain 03/24/2013    Priority: Low  . Bradycardia 09/23/2012    Priority: Low  . Bruising 09/23/2012    Priority: Low  . B12 deficiency 09/17/2010    Priority: Low  . PERIPHERAL NEUROPATHY 10/18/2007    Priority: Low  . DEGENERATIVE JOINT DISEASE 11/27/2006    Priority: Low  . NEPHROLITHIASIS, HX OF 11/27/2006    Priority: Low  . Onychomycosis 10/28/2017  . Spondylosis 06/25/2017     Medications- reviewed and updated Current Outpatient Medications  Medication Sig Dispense Refill  . acetaminophen (TYLENOL) 325 MG tablet Take 650 mg by mouth as needed.    Marland Kitchen amLODipine (NORVASC) 5 MG tablet Take 1 tablet (5 mg total) by mouth daily. 90 tablet 3  . aspirin EC 81 MG tablet Take 1 tablet (81 mg total) by mouth daily. 90 tablet 1  . atorvastatin (LIPITOR) 40 MG tablet Take 1 tablet (40 mg total) by mouth daily. 90 tablet 3  . beta carotene w/minerals (OCUVITE) tablet Take 1 tablet by mouth daily.    . carvedilol (COREG) 3.125 MG tablet Take 1 tablet (3.125 mg total) by mouth 2 (two) times daily. 180 tablet 3  . Cyanocobalamin (VITAMIN B-12) 5000 MCG TBDP Take 5,000 mcg by mouth daily.    Marland Kitchen losartan (COZAAR) 100 MG tablet Take 1 tablet (100 mg total) by mouth daily. 90 tablet 3  . nitroGLYCERIN (NITROSTAT) 0.4 MG SL tablet Place 1 tablet (0.4 mg total) under the tongue every 5 (five) minutes x 3 doses as needed for chest pain. 25 tablet 2  . spironolactone (ALDACTONE) 25 MG tablet Take 1 tablet (25 mg total) by mouth daily. 90 tablet 3   No current facility-administered medications for this visit.     Objective:  BP 126/62   Pulse (!) 55   Temp 98.3 F (36.8 C) (Temporal)  Ht 6\' 1"  (1.854 m)   Wt 208 lb 9.6 oz (94.6 kg)   SpO2 95%   BMI 27.52 kg/m  Gen: NAD, resting comfortably CV: RRR stable faint murmur Lungs: CTAB no crackles, wheeze, rhonchi Abdomen: soft/nontender/nondistended/normal bowel sounds. No rebound or guarding.  Ext: no edema Skin: warm, dry No lymphadenopathy in head or neck, axilla, groin     Assessment and Plan  # Fatigue  S:Patiented noticed increased fatigue in the last 4 months. Noticed it started with Covid. Thinks has increased to to lack of exercise and in alibly to go to gym.  He denies chest pain or shortness of breath  A/P: unclear cuause- will updated labs including cbc, cmp, tsh, pth  -Patient has had hyperparathyroidism in the  past-with fatigue associated with this-we will check calcium level.  We will also check PTH - did have 3 family members with non hodgkins lymphoma- he does not have any known enlarged lymph nodes - patient has lost some weight but has been trying to cut down on foods- 8 lbs in 7 months. From last march he is down 12 lbs. He will let me know if continues to lose weight- he knows my preference is for weight to stabilize. He also is walking daily.   # Loose stools  S:Started three weeks ago. After every meal has loose watery stools- usually 3x a day. Denies any blood in stool or dark stools. Happens after every meal. Wife without diarrhea. Has tried a dose here or there of ommodium but not sure it has helped.  A/P: check tsh as hyperthyroidism can cause diarrhea and he has had slight weight loss. Also check stool studies for c. Diff or infection- if this is negative and symptoms persist could consider GI referral. Also recommended trial of immodium   #Hypertension S: Compliant with losartan 100 mg, spironolactone 25 mg, Coreg 3.125 mg twice daily, amlodipine 5 mg. No lightheadedness/dizziness  BP Readings from Last 3 Encounters:  06/20/19 126/62  06/09/19 132/62  05/06/19 110/60   A/P:  Stable. Continue current medications.    #Hyperlipidemia/CAD status post PCI after STEMI August 2017 S: Patient is compliant with atorvastatin 40 mg.  LDL has been well under 70.  He is also compliant with aspirin- prior was on Plavix as well but had particularly easy bruising/bleeding Lab Results  Component Value Date   CHOL 66 12/17/2018   HDL 29.70 (L) 12/17/2018   LDLCALC 26 12/17/2018   LDLDIRECT 31.0 11/06/2017   TRIG 56.0 12/17/2018   CHOLHDL 2 12/17/2018   A/P: Hyperlipidemia is very well controlled  CAD appears stable-I do not strongly suspect fatigue is heart related but I did encourage follow-up with cardiology.  He actually already seen them within 2 weeks Dr. Martinique. He agrees to refill  nitroglycerin as last filled 2 years ago   Other notes: 1.  Senile purpura-stable on aspirin alone.  Check CBC  Recommended follow up: 6 months for physical No future appointments.  Lab/Order associations:   ICD-10-CM   1. Fatigue, unspecified type  R53.83 TSH    CBC with Differential/Platelet    Comprehensive metabolic panel  2. Primary hyperparathyroidism (H[facility])  E21.0 PTH, intact (no Ca)  3. CAD S/P PCI  I25.10    Z98.61   4. Essential hypertension  I10   5. Dyslipidemia  E78.5   6. Senile purpura ([facility])  D69.2   7. Diarrhea, unspecified type  R19.7 C. Difficile Toxin    Stool culture  Stool culture    C. Difficile Toxin    Meds ordered this encounter  Medications  . nitroGLYCERIN (NITROSTAT) 0.4 MG SL tablet    Sig: Place 1 tablet (0.4 mg total) under the tongue every 5 (five) minutes x 3 doses as needed for chest pain.    Dispense:  25 tablet    Refill:  2   Return precautions advised.  Garret Reddish, MD

## 2019-06-20 ENCOUNTER — Other Ambulatory Visit: Payer: Self-pay

## 2019-06-20 ENCOUNTER — Ambulatory Visit (INDEPENDENT_AMBULATORY_CARE_PROVIDER_SITE_OTHER): Payer: Medicare Other | Admitting: Family Medicine

## 2019-06-20 ENCOUNTER — Encounter: Payer: Self-pay | Admitting: Family Medicine

## 2019-06-20 VITALS — BP 126/62 | HR 55 | Temp 98.3°F | Ht 73.0 in | Wt 208.6 lb

## 2019-06-20 DIAGNOSIS — I10 Essential (primary) hypertension: Secondary | ICD-10-CM | POA: Diagnosis not present

## 2019-06-20 DIAGNOSIS — R197 Diarrhea, unspecified: Secondary | ICD-10-CM

## 2019-06-20 DIAGNOSIS — I2510 Atherosclerotic heart disease of native coronary artery without angina pectoris: Secondary | ICD-10-CM

## 2019-06-20 DIAGNOSIS — E785 Hyperlipidemia, unspecified: Secondary | ICD-10-CM | POA: Diagnosis not present

## 2019-06-20 DIAGNOSIS — E210 Primary hyperparathyroidism: Secondary | ICD-10-CM

## 2019-06-20 DIAGNOSIS — D692 Other nonthrombocytopenic purpura: Secondary | ICD-10-CM

## 2019-06-20 DIAGNOSIS — R5383 Other fatigue: Secondary | ICD-10-CM

## 2019-06-20 DIAGNOSIS — Z9861 Coronary angioplasty status: Secondary | ICD-10-CM

## 2019-06-20 LAB — CBC WITH DIFFERENTIAL/PLATELET
Basophils Absolute: 0.1 10*3/uL (ref 0.0–0.1)
Basophils Relative: 0.6 % (ref 0.0–3.0)
Eosinophils Absolute: 0.2 10*3/uL (ref 0.0–0.7)
Eosinophils Relative: 1.9 % (ref 0.0–5.0)
HCT: 36.8 % — ABNORMAL LOW (ref 39.0–52.0)
Hemoglobin: 12.6 g/dL — ABNORMAL LOW (ref 13.0–17.0)
Lymphocytes Relative: 11.2 % — ABNORMAL LOW (ref 12.0–46.0)
Lymphs Abs: 1.1 10*3/uL (ref 0.7–4.0)
MCHC: 34.2 g/dL (ref 30.0–36.0)
MCV: 97.2 fl (ref 78.0–100.0)
Monocytes Absolute: 0.8 10*3/uL (ref 0.1–1.0)
Monocytes Relative: 8.6 % (ref 3.0–12.0)
Neutro Abs: 7.4 10*3/uL (ref 1.4–7.7)
Neutrophils Relative %: 77.7 % — ABNORMAL HIGH (ref 43.0–77.0)
Platelets: 236 10*3/uL (ref 150.0–400.0)
RBC: 3.79 Mil/uL — ABNORMAL LOW (ref 4.22–5.81)
RDW: 14.6 % (ref 11.5–15.5)
WBC: 9.5 10*3/uL (ref 4.0–10.5)

## 2019-06-20 LAB — COMPREHENSIVE METABOLIC PANEL
ALT: 10 U/L (ref 0–53)
AST: 12 U/L (ref 0–37)
Albumin: 3.9 g/dL (ref 3.5–5.2)
Alkaline Phosphatase: 78 U/L (ref 39–117)
BUN: 26 mg/dL — ABNORMAL HIGH (ref 6–23)
CO2: 28 mEq/L (ref 19–32)
Calcium: 8.8 mg/dL (ref 8.4–10.5)
Chloride: 102 mEq/L (ref 96–112)
Creatinine, Ser: 1 mg/dL (ref 0.40–1.50)
GFR: 71.35 mL/min (ref >60.00)
Glucose, Bld: 84 mg/dL (ref 70–99)
Potassium: 4.2 mEq/L (ref 3.5–5.1)
Sodium: 138 mEq/L (ref 135–145)
Total Bilirubin: 1.5 mg/dL — ABNORMAL HIGH (ref 0.2–1.2)
Total Protein: 6.2 g/dL (ref 6.0–8.3)

## 2019-06-20 LAB — TSH: TSH: 3.09 u[IU]/mL (ref 0.35–4.50)

## 2019-06-20 MED ORDER — NITROGLYCERIN 0.4 MG SL SUBL: 0.4000 mg | SUBLINGUAL_TABLET | SUBLINGUAL | 2 refills | Status: DC | PRN

## 2019-06-20 NOTE — Patient Instructions (Addendum)
Health Maintenance Due  Topic Date Due  . COLONOSCOPY  Will have done this year. We had discussed referral to GI if stool studies negative anyway so this would be a good place to follo wup  12/16/2018   Please stop by lab before you go  Make sure to pick up stool collection kit for stool culture/c. difficile If you do not have mychart- we will call you about results within 5 business days of [place] receiving them.  If you have mychart- we will send your results within 3 business days of [place] receiving them.  If abnormal or we want to clarify a result, we will call or mychart you to make sure you receive the message.  If you have questions or concerns or don't hear within 5 business days, please send [place] a message or call us.    Recommended follow up: Return in about 6 months (around 12/20/2019) for physical or sooner if needed. (if fatigue worsens or fails to improve, if diarrhea isnot better).

## 2019-06-20 NOTE — Addendum Note (Signed)
Addended by: Francis Dowse T on: 06/20/2019 12:02 PM   Modules accepted: Orders

## 2019-06-20 NOTE — Addendum Note (Signed)
Addended by: Francis Dowse T on: 06/20/2019 12:09 PM   Modules accepted: Orders

## 2019-06-21 ENCOUNTER — Other Ambulatory Visit: Payer: Medicare Other

## 2019-06-21 DIAGNOSIS — R197 Diarrhea, unspecified: Secondary | ICD-10-CM

## 2019-06-21 LAB — C.DIFFICILE TOXIN: C. Difficile Toxin A: NOT DETECTED

## 2019-06-21 LAB — PARATHYROID HORMONE, INTACT (NO CA): PTH: 31 pg/mL (ref 14–64)

## 2019-06-22 LAB — STOOL CULTURE

## 2019-06-22 LAB — EXTRA SPECIMEN

## 2019-06-30 ENCOUNTER — Encounter: Payer: Self-pay | Admitting: Family Medicine

## 2019-07-05 DIAGNOSIS — H35372 Puckering of macula, left eye: Secondary | ICD-10-CM | POA: Diagnosis not present

## 2019-07-05 DIAGNOSIS — H5211 Myopia, right eye: Secondary | ICD-10-CM | POA: Diagnosis not present

## 2019-07-05 DIAGNOSIS — Z961 Presence of intraocular lens: Secondary | ICD-10-CM | POA: Diagnosis not present

## 2019-07-05 DIAGNOSIS — H353132 Nonexudative age-related macular degeneration, bilateral, intermediate dry stage: Secondary | ICD-10-CM | POA: Diagnosis not present

## 2019-07-05 DIAGNOSIS — H52223 Regular astigmatism, bilateral: Secondary | ICD-10-CM | POA: Diagnosis not present

## 2019-09-10 ENCOUNTER — Other Ambulatory Visit: Payer: Self-pay | Admitting: Family Medicine

## 2019-10-18 ENCOUNTER — Telehealth: Payer: Self-pay | Admitting: Family Medicine

## 2019-10-18 NOTE — Progress Notes (Signed)
  Chronic Care Management   Outreach Note  10/18/2019 Name: ROLAND LIPKE MRN: [id number] DOB: 1934-12-21  Referred by: [name], MD Reason for referral : No chief complaint on file.   An unsuccessful telephone outreach was attempted today. The patient was referred to the pharmacist for assistance with care management and care coordination.   Follow Up Plan:   [name] Upstream Scheduler

## 2019-10-19 ENCOUNTER — Telehealth: Payer: Self-pay | Admitting: Family Medicine

## 2019-10-19 NOTE — Progress Notes (Signed)
  Chronic Care Management   Note  10/19/2019 Name: [name] MRN: [id number] DOB: 08/17/1934  Jose Ware is a 85 y.o. year old male who is a primary care patient of [name], MD. I reached out to Jose Ware by phone today in response to a referral sent by [name]'s PCP, [name], MD.   [name] was given information about Chronic Care Management services today including:  1. CCM service includes personalized support from designated clinical staff supervised by his physician, including individualized plan of care and coordination with other care providers 2. 24/7 contact phone numbers for assistance for urgent and routine care needs. 3. Service will only be billed when office clinical staff spend 20 minutes or more in a month to coordinate care. 4. Only one practitioner may furnish and bill the service in a calendar month. 5. The patient may stop CCM services at any time (effective at the end of the month) by phone call to the office staff.   Patient agreed to services and verbal consent obtained.   Follow up plan:   [name] Upstream Scheduler

## 2019-11-26 ENCOUNTER — Other Ambulatory Visit: Payer: Self-pay | Admitting: Cardiology

## 2019-11-30 NOTE — Progress Notes (Addendum)
12/06/2019 Jose Ware   [birth year]  [id number]  Primary Physician Jose Channel Brayton Mars, MD Primary Cardiologist: Dr Jose Ware  HPI:  Jose Ware is seen for follow up CAD. He has a history of HTN, remote tobacco abuse, psoriasis, prostate cancer s/p XRT and radiation seed therapy, adenomatous colon polyps and diverticulitis s/p previous abdominal surgeries.   He presented to [facility] on 11/04/15 as a inferolateral STEMI. Emergent cath revealed a very large LCx with extensive thrombus. He underwent PCI with DES. With reperfusion he had NSVT. The pt also had residual RCA and LAD disease and an EF of 25% at cath. Echo done 11/05/15 showed an EF of 40-45%. His Troponin peaked at 25. On 11/06/15 he was taken back to the lab for staged PCI to his LAD and RCA. This was successful but on 11/07/15 the pt related that he developed some word finding issues that started post PCI 8/29. MRI revealed scattered small acute infarcts in the bilateral anterior and posterior circulation.  CA dopplers revealed mild plaque (1-39%). The pt's symptoms improved.    On follow up today he reports he is doing well. He denies any chest pain or SOB. He tries to walk daily and is doing some weight training in the gym.  Tolerating medication well.   Current Outpatient Medications  Medication Sig Dispense Refill  . acetaminophen (TYLENOL) 325 MG tablet Take 650 mg by mouth as needed.    Marland Kitchen amLODipine (NORVASC) 5 MG tablet Take 1 tablet by mouth once daily 90 tablet 0  . aspirin EC 81 MG tablet Take 1 tablet (81 mg total) by mouth daily. 90 tablet 1  . atorvastatin (LIPITOR) 40 MG tablet Take 1 tablet (40 mg total) by mouth daily. 90 tablet 3  . beta carotene w/minerals (OCUVITE) tablet Take 1 tablet by mouth daily.    . carvedilol (COREG) 3.125 MG tablet Take 1 tablet (3.125 mg total) by mouth 2 (two) times daily. 180 tablet 3  . Cyanocobalamin (VITAMIN B-12) 5000 MCG TBDP Take 5,000 mcg by mouth daily.    Marland Kitchen losartan (COZAAR)  100 MG tablet Take 1 tablet (100 mg total) by mouth daily. 90 tablet 3  . nitroGLYCERIN (NITROSTAT) 0.4 MG SL tablet Place 1 tablet (0.4 mg total) under the tongue every 5 (five) minutes x 3 doses as needed for chest pain. 25 tablet 2  . spironolactone (ALDACTONE) 25 MG tablet Take 1 tablet (25 mg total) by mouth daily. 90 tablet 3   No current facility-administered medications for this visit.    Allergies  Allergen Reactions  . Bee Venom Swelling    Facial swelling  . Betadine [Povidone Iodine] Other (See Comments)    blistering  . Doxycycline     Possible drug rash- back of right lower leg and onto left lower leg as well    Social History   Socioeconomic History  . Marital status: Married    Spouse name: Not on file  . Number of children: 3  . Years of education: Not on file  . Highest education level: Not on file  Occupational History  . Not on file  Tobacco Use  . Smoking status: Former Smoker    Packs/day: 2.00    Years: 37.00    Pack years: 74.00    Quit date: 03/11/1983    Years since quitting: 36.7  . Smokeless tobacco: Never Used  . Tobacco comment: started smoking in the service; ICU 85' part of his stomach; appendix; coma  Vaping Use  . Vaping Use: Never used  Substance and Sexual Activity  . Alcohol use: Yes    Alcohol/week: 0.0 standard drinks    Comment: RARE  . Drug use: No  . Sexual activity: Not on file  Other Topics Concern  . Not on file  Social History Narrative   [employer] [place], [place].   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 Difficulty of Paying Living  Expenses: Not on file  Food Insecurity:   . Worried About Charity fundraiser in the Last Year: Not on file  . Ran Out of Food in the Last Year: Not on file  Transportation Needs:   . Lack of Transportation (Medical): Not on file  . Lack of Transportation (Non-Medical): Not on file  Physical Activity:   . Days of Exercise per Week: Not on file  . Minutes of Exercise per Session: Not on file  Stress:   . Feeling of Stress : Not on file  Social Connections:   . Frequency of Communication with Friends and Family: Not on file  . Frequency of Social Gatherings with Friends and Family: Not on file  . Attends Religious Services: Not on file  . Active Member of Clubs or Organizations: Not on file  . Attends Archivist Meetings: Not on file  . Marital Status: Not on file  Intimate Partner Violence:   . Fear of Current or Ex-Partner: Not on file  . Emotionally Abused: Not on file  . Physically Abused: Not on file  . Sexually Abused: Not on file     Review of Systems: As noted in HPI.  All other systems reviewed and are otherwise negative except as noted above.  Blood pressure 140/70, pulse (!) 53, height 6' 0.5" (1.842 m), weight 208 lb (94.3 kg), SpO2 96 %.  GENERAL:  Well appearing WM in NAD HEENT:  PERRL, EOMI, sclera are clear. Oropharynx is clear. NECK:  No jugular venous distention, carotid upstroke brisk and symmetric, no bruits, no thyromegaly or adenopathy LUNGS:  Clear to auscultation bilaterally CHEST:  Unremarkable HEART:  RRR,  PMI not displaced or sustained,S1 and S2 within normal limits, no S3, no S4: no clicks, no rubs, no murmurs ABD:  Soft, nontender. BS +, no masses or bruits. No hepatomegaly, no splenomegaly EXT:  2 + pulses throughout, no edema, no cyanosis no clubbing SKIN:  Warm and dry.  No rashes NEURO:  Alert and oriented x 3. Cranial nerves II through XII intact. PSYCH:  Cognitively intact   Laboratory data:  Lab Results  Component Value Date    WBC 9.5 06/20/2019   HGB 12.6 (L) 06/20/2019   HCT 36.8 (L) 06/20/2019   PLT 236.0 06/20/2019   GLUCOSE 84 06/20/2019   CHOL 66 12/17/2018   TRIG 56.0 12/17/2018   HDL 29.70 (L) 12/17/2018   LDLDIRECT 31.0 11/06/2017   LDLCALC 26 12/17/2018   ALT 10 06/20/2019   AST 12 06/20/2019   NA 138 06/20/2019  K 4.2 06/20/2019   CL 102 06/20/2019   CREATININE 1.00 06/20/2019   BUN 26 (H) 06/20/2019   CO2 28 06/20/2019   TSH 3.09 06/20/2019   PSA 0.00 (L) 12/17/2018   INR 1.08 11/04/2015   HGBA1C 5.3 11/04/2015   Ecg today shows sinus brady rate 53. Normal. I have personally reviewed and interpreted this study.   ASSESSMENT AND PLAN:  1. CAD s/p inferolateral STEMI in August 2017 with thrombotic occlusion of the LCx. S/p DES. Subsequent staged PCI of the proximal LAD and RCA. He has no anginal symptoms. Continue ASA. Continue low dose Coreg.  2. Ischemic LV dysfunction. Asymptomatic. On Coreg. Losartan, aldactone. No overt evidence of  CHF. 3. 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 No residual symptoms.  4. Hypertension. Well controlled now.  5. Hyperlipidemia. On high dose statin. Plans for follow up labs with Dr Jose Channel.   PLAN   Follow up in 6 months.  Chauntay Paszkiewicz [place] MD,FACC 12/06/2019 1:55 PM

## 2019-12-06 ENCOUNTER — Ambulatory Visit: Payer: Medicare Other | Admitting: Cardiology

## 2019-12-06 ENCOUNTER — Other Ambulatory Visit: Payer: Self-pay

## 2019-12-06 ENCOUNTER — Encounter: Payer: Self-pay | Admitting: Cardiology

## 2019-12-06 VITALS — BP 140/70 | HR 53 | Ht 72.5 in | Wt 208.0 lb

## 2019-12-06 DIAGNOSIS — E785 Hyperlipidemia, unspecified: Secondary | ICD-10-CM

## 2019-12-06 DIAGNOSIS — I10 Essential (primary) hypertension: Secondary | ICD-10-CM

## 2019-12-06 DIAGNOSIS — I5022 Chronic systolic (congestive) heart failure: Secondary | ICD-10-CM

## 2019-12-06 DIAGNOSIS — I2510 Atherosclerotic heart disease of native coronary artery without angina pectoris: Secondary | ICD-10-CM

## 2019-12-10 ENCOUNTER — Other Ambulatory Visit: Payer: Self-pay | Admitting: Family Medicine

## 2019-12-19 ENCOUNTER — Encounter: Payer: Self-pay | Admitting: Family Medicine

## 2019-12-22 NOTE — Patient Instructions (Addendum)
Health Maintenance Due  Topic Date Due  . COLONOSCOPY call to get this scheduled 12/16/2018  . INFLUENZA VACCINE In office flu shot today high dose 10/09/2019   We will call you within two weeks about your referral to pulmonary for snoring/possible sleep apnea. If you do not hear within 3 weeks, give [place] a call.   Please stop by lab before you go If you have mychart- we will send your results within 3 business days of [place] receiving them.  If you do not have mychart- we will call you about results within 5 business days of [place] receiving them.  *please note we are currently using [facility] which has a longer processing time than [place] typically so labs may not come back as quickly as in the past *please also note that you will see labs on mychart as soon as they post. I will later go in and write notes on them- will say "notes from Dr. Yong Channel"

## 2019-12-22 NOTE — Progress Notes (Signed)
Phone: [phone]   Subjective:  Patient presents today for their annual physical. Chief complaint-noted.   See problem oriented charting- ROS- full  review of systems was completed and negative  except for: runny nose when eats, urinary urgency stable, joint and back pain up and own depends on the day, bruising easily  The following were reviewed and entered/updated in epic: Past Medical History:  Diagnosis Date  . Adenocarcinoma of prostate [facility])    XRT & Radiation seed implantation in 2010  . Adenomatous colon polyp   . CAD (coronary artery disease)    a. 11/04/15:  Acute inferolateral STEMI: S/p emergent DES of a very large LCx with extensive thrombus. Significant residual disease in the proximal LAD and distal RCA. S/p staged PCI of LAD and RCA 8/29.  . Cataract   . CVA (cerebral infarction)    a. 0/1601: embolic CVA after heart cath   . CVA (cerebral infarction)    a. 0/9323: embolic CVA after heart cath   . DIVERTICULITIS, HX OF 11/27/2006       . DJD (degenerative joint disease)    wrist - R   . Hernia   . History of blood transfusion 1985   with colon surgery  . Hypertension   . Ischemic cardiomyopathy    a. EF 25-30% by LV gram on cath 11/04/15. Appeared out of proportion to infarct although infarct was large. EF improved by Echo and now 40-45%.  . Myocardial infarction ([place])   . NEPHROLITHIASIS, HX OF 11/27/2006        . Peripheral neuropathy   . Psoriasis   . Tenosynovitis    Patient Active Problem List   Diagnosis Date Noted  . CAD S/P PCI     Priority: High  . Ischemic cardiomyopathy     Priority: High  . History of embolic stroke     Priority: High  . Primary hyperparathyroidism ([place]) 12/28/2013    Priority: High  . Fatigue 12/01/2013    Priority: High  . History of prostate cancer 12/14/2008    Priority: High  . Post herpetic neuralgia 06/20/2017    Priority: Medium  . Spinal stenosis of lumbar region at multiple levels 04/30/2017    Priority:  Medium  . Dyslipidemia 11/16/2015    Priority: Medium  . History of adenomatous polyp of colon 01/18/2015    Priority: Medium  . PSORIASIS 10/15/2007    Priority: Medium  . Essential hypertension 11/27/2006    Priority: Medium  . Drug reaction 02/05/2017    Priority: Low  . Senile purpura ([place]) 02/05/2017    Priority: Low  . History of ventricular tachycardia 11/08/2015    Priority: Low  . History of stomach cancer 12/01/2013    Priority: Low  . Lower back pain 03/24/2013    Priority: Low  . Bradycardia 09/23/2012    Priority: Low  . Bruising 09/23/2012    Priority: Low  . B12 deficiency 09/17/2010    Priority: Low  . PERIPHERAL NEUROPATHY 10/18/2007    Priority: Low  . DEGENERATIVE JOINT DISEASE 11/27/2006    Priority: Low  . NEPHROLITHIASIS, HX OF 11/27/2006    Priority: Low  . Onychomycosis 10/28/2017  . Spondylosis 06/25/2017   Past Surgical History:  Procedure Laterality Date  . APPENDECTOMY  1985  . BACK SURGERY  1980   minor disk surgery: instrumentation placed and removed in a second procedure  . CARDIAC CATHETERIZATION N/A 11/04/2015   Procedure: Left Heart Cath and Coronary Angiography;  Surgeon: Collier Salina  M Martinique, MD;  Location: [facility];  Service: Cardiovascular;  Laterality: N/A;  . CARDIAC CATHETERIZATION N/A 11/04/2015   Procedure: Coronary Stent Intervention;  Surgeon: Peter M Martinique, MD;  Location: [facility];  Service: Cardiovascular;  Laterality: N/A;  . CARDIAC CATHETERIZATION N/A 11/06/2015   Procedure: Coronary Stent Intervention;  Surgeon: Leonie Man, MD;  Location: [facility];  Service: Cardiovascular;  Laterality: N/A;  . CATARACT EXTRACTION, BILATERAL  2013  . CHOLECYSTECTOMY  1986  . COLON SURGERY  1980's   pt. had ileus, had colon surgery, requiring colostomy & then reversal & then dehisence of that wound & return to OR for repair & cholecystectomy    . COLONOSCOPY    . COLOSTOMY  1985   After colectomy for  diverticulitis, pt. remarks during this surgery they "gave me the paddles two times  because of bleeding", pt. states it was unrelated to any anesthesia complication  . EYE SURGERY     /w IOL  . KNEE ARTHROSCOPY Left 2003  . KNEE SURGERY  1956   Left 1956, Right w/cartilage removed later  . PARATHYROIDECTOMY N/A 05/12/2014   Procedure: PARATHYROIDECTOMY;  Surgeon: Armandina Gemma, MD;  Location: [facility];  Service: General;  Laterality: N/A;  . REVERSAL OF COLOSTOMY  1987  . TONSILLECTOMY  1946  . WRIST SURGERY Left    Remote complicated w/infection    Family History  Problem Relation Age of Onset  . Coronary artery disease Mother   . Alcohol abuse Father   . Cirrhosis Father   . Heart failure Sister   . Breast cancer Sister        W/involvement of right arm leading to amputation  . Coronary artery disease Brother   . Heart attack Brother   . Non-Hodgkin's lymphoma Brother   . Clotting disorder Brother   . Non-Hodgkin's lymphoma Brother   . Anemia Sister        related to treatment to lymphoma  . Non-Hodgkin's lymphoma Sister   . Prostate cancer Neg Hx   . Colon cancer Neg Hx   . Diabetes Neg Hx   . Glaucoma Neg Hx   . Rectal cancer Neg Hx   . Esophageal cancer Neg Hx     Medications- reviewed and updated Current Outpatient Medications  Medication Sig Dispense Refill  . acetaminophen (TYLENOL) 325 MG tablet Take 650 mg by mouth as needed.    Marland Kitchen amLODipine (NORVASC) 5 MG tablet Take 1 tablet by mouth once daily 90 tablet 0  . aspirin EC 81 MG tablet Take 1 tablet (81 mg total) by mouth daily. 90 tablet 1  . atorvastatin (LIPITOR) 40 MG tablet Take 1 tablet (40 mg total) by mouth daily. 90 tablet 3  . beta carotene w/minerals (OCUVITE) tablet Take 1 tablet by mouth daily.    . carvedilol (COREG) 3.125 MG tablet Take 1 tablet (3.125 mg total) by mouth 2 (two) times daily. 180 tablet 3  . Cyanocobalamin (VITAMIN B-12) 5000 MCG TBDP Take 5,000 mcg by mouth daily.    Marland Kitchen losartan  (COZAAR) 100 MG tablet Take 1 tablet (100 mg total) by mouth daily. 90 tablet 3  . nitroGLYCERIN (NITROSTAT) 0.4 MG SL tablet Place 1 tablet (0.4 mg total) under the tongue every 5 (five) minutes x 3 doses as needed for chest pain. 25 tablet 2  . spironolactone (ALDACTONE) 25 MG tablet Take 1 tablet (25 mg total) by mouth daily. 90 tablet 3  No current facility-administered medications for this visit.    Allergies-reviewed and updated Allergies  Allergen Reactions  . Bee Venom Swelling    Facial swelling  . Betadine [Povidone Iodine] Other (See Comments)    blistering  . Doxycycline     Possible drug rash- back of right lower leg and onto left lower leg as well    Social History   Social History Narrative   [place].   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 Oropharynx normal Neck: no thyromegaly CV: RRR no murmurs rubs or gallops Lungs: CTAB no crackles, wheeze, rhonchi Abdomen: soft/nontender/nondistended/normal bowel sounds. No rebound or guarding.  Ext: no edema Skin: warm, dry Neuro: grossly normal, moves all extremities, PERRLA    Assessment and Plan  85 y.o. male presenting for annual physical.  Health Maintenance counseling: 1. Anticipatory guidance: Patient counseled regarding regular dental exams q6 months, eye exams-twice yearly,  avoiding smoking and second hand smoke, limiting alcohol to  2 beverages per day.   2. Risk factor reduction:  Advised patient of need for regular exercise and diet rich and fruits and vegetables to reduce risk of heart attack and stroke. Exercise- gym 2-3 days a week. Diet-reasonably healthy diet.  Wt Readings from Last 3 Encounters:  12/23/19 207 lb 12.8 oz (94.3 kg)  12/06/19 208 lb (94.3 kg)  06/20/19 208 lb 9.6 oz (94.6 kg)  3. Immunizations/screenings/ancillary studies- high dose flu shot today Immunization History  Administered Date(s) Administered  . Fluad Quad(high Dose 65+) 12/17/2018  . Influenza Split 01/06/2012  . Influenza Whole 12/19/2008, 12/27/2009  . Influenza, High Dose Seasonal PF 12/21/2012, 12/22/2014, 02/05/2017, 12/08/2017  . Influenza,inj,Quad PF,6+ Mos 12/01/2013, 10/24/2015  . PFIZER SARS-COV-2 Vaccination 04/07/2019, 04/21/2019, 12/08/2019  . Pneumococcal Conjugate-13 09/14/2015  . Pneumococcal Polysaccharide-23 02/12/2009  . Td 03/12/2003  . Tdap 02/27/2014  . Zoster Recombinat (Shingrix) 11/24/2017, 01/26/2018  4. Prostate cancer screening- we check PSAs.  still seeing Dr. Duaine Dredge but we follow PSA for him.  Lab Results  Component Value Date   PSA 0.00 (L) 12/17/2018   PSA <0.015 01/22/2016   PSA 0.01 (L) 12/15/2014   5. Colon cancer screening - history adenomatous polyp- 2019 with 1 year repeat but pushed to 2021 due to insurance plus covid- he is going to call to set this up 6. Skin cancer screening- no dermatologist since Dr. Sherrye Payor retired. Declines for now- may get set up with Dr. Martinique. advised regular sunscreen use. Denies worrisome, changing, or new skin lesions.  7. Former smoker- quit in 1980s- no regular screening 8. STD screening - monogamous   Status of chronic or acute concerns   #fatigue from last visit- ongoing about 10 months. An fall asleep even during  conversations. Some snoring per wife. Tested over 10 years ago. Labs at last visit without obvious cause of fatigue other than slight anemia  and slight high bilirubin- recheck both today. Also refer to pulmonology as I am concerned about possible sleep apnea . #Hypertension S: Compliant with losartan 100 mg, spironolactone 25 mg, Coreg 3.125 mg twice daily, amlodipine 5 mg. BP Readings from Last 3 Encounters:  12/23/19 128/70  12/06/19 140/70  06/20/19 126/62  A/P: blood pressure well controlled- continue current meds   #Hyperlipidemia/CAD status post PCI after STEMI August 2017 S: Patient is compliant with atorvastatin 40 mg.  LDL has been well under 70.  He is also compliant with aspirin- prior was on Plavix as well but had easy bruising/bleeding. No chest pain or shortness of breath. Never used nitroglycerin Lab Results  Component Value Date   CHOL 66 12/17/2018   HDL 29.70 (L) 12/17/2018   LDLCALC 26 12/17/2018   LDLDIRECT 31.0 11/06/2017   TRIG 56.0 12/17/2018   CHOLHDL 2 12/17/2018  A/P: CAD asymptomatic- continue current meds HLD-hopefully controlled- update lipid panel. If LDL is below 40 again- consider 20 mg dose    Other notes: 1.  Senile purpura-stable on aspirin alone 2.  History primary hyperparathyroidism- treated with parathyroid surgery but will continue to monitor calcium levels  Recommended follow up: Return in about 6 months (around 06/22/2020) for follow up- or sooner if needed. Future Appointments  Date Time Provider [place]  01/02/2020  9:30 AM [facility] CCM PHARMACIST [facility]  05/14/2020 11:00 AM Martinique, Peter M, MD [facility]   Lab/Order associations:Non fasting- coffee with sugar 4 30 AM and labs around noon   ICD-10-CM   1. Preventative health care  Z00.00 PSA    Ambulatory referral to Pulmonology    CBC With Differential/Platelet    COMPLETE METABOLIC PANEL WITH GFR    Lipid Panel w/reflex Direct LDL  2. Essential hypertension  I10 CBC With Differential/Platelet    COMPLETE METABOLIC PANEL WITH GFR    Lipid Panel w/reflex Direct LDL  3. Primary hyperparathyroidism  (HCC)  E21.0   4. B12 deficiency  E53.8   5. Dyslipidemia  E78.5 CBC With Differential/Platelet    COMPLETE METABOLIC PANEL WITH GFR    Lipid Panel w/reflex Direct LDL  6. Snoring  R06.83 Ambulatory referral to Pulmonology  7. Excessive daytime sleepiness  G47.19 Ambulatory referral to Pulmonology  8. History of prostate cancer  Z85.46 PSA    No orders of the defined types were placed in this encounter.   Return precautions advised.  Garret Reddish, MD

## 2019-12-23 ENCOUNTER — Other Ambulatory Visit: Payer: Self-pay

## 2019-12-23 ENCOUNTER — Encounter: Payer: Self-pay | Admitting: Family Medicine

## 2019-12-23 ENCOUNTER — Ambulatory Visit (INDEPENDENT_AMBULATORY_CARE_PROVIDER_SITE_OTHER): Payer: Medicare Other | Admitting: Family Medicine

## 2019-12-23 VITALS — BP 128/70 | HR 53 | Temp 97.8°F | Resp 18 | Ht 73.0 in | Wt 207.8 lb

## 2019-12-23 DIAGNOSIS — E785 Hyperlipidemia, unspecified: Secondary | ICD-10-CM

## 2019-12-23 DIAGNOSIS — G4719 Other hypersomnia: Secondary | ICD-10-CM

## 2019-12-23 DIAGNOSIS — I10 Essential (primary) hypertension: Secondary | ICD-10-CM

## 2019-12-23 DIAGNOSIS — Z0000 Encounter for general adult medical examination without abnormal findings: Secondary | ICD-10-CM

## 2019-12-23 DIAGNOSIS — Z23 Encounter for immunization: Secondary | ICD-10-CM | POA: Diagnosis not present

## 2019-12-23 DIAGNOSIS — E538 Deficiency of other specified B group vitamins: Secondary | ICD-10-CM

## 2019-12-23 DIAGNOSIS — E210 Primary hyperparathyroidism: Secondary | ICD-10-CM

## 2019-12-23 DIAGNOSIS — Z8546 Personal history of malignant neoplasm of prostate: Secondary | ICD-10-CM

## 2019-12-23 DIAGNOSIS — R0683 Snoring: Secondary | ICD-10-CM

## 2019-12-24 LAB — COMPLETE METABOLIC PANEL WITH GFR
AG Ratio: 1.5 (calc) (ref 1.0–2.5)
ALT: 8 U/L — ABNORMAL LOW (ref 9–46)
AST: 10 U/L (ref 10–35)
Albumin: 3.8 g/dL (ref 3.6–5.1)
Alkaline phosphatase (APISO): 75 U/L (ref 35–144)
BUN/Creatinine Ratio: 25 (calc) — ABNORMAL HIGH (ref 6–22)
BUN: 27 mg/dL — ABNORMAL HIGH (ref 7–25)
CO2: 28 mmol/L (ref 20–32)
Calcium: 9.1 mg/dL (ref 8.6–10.3)
Chloride: 103 mmol/L (ref 98–110)
Creat: 1.1 mg/dL (ref 0.70–1.11)
GFR, Est African American: 72 mL/min/{1.73_m2} (ref >=60)
GFR, Est Non African American: 62 mL/min/{1.73_m2} (ref >=60)
Globulin: 2.5 g/dL (calc) (ref 1.9–3.7)
Glucose, Bld: 88 mg/dL (ref 65–99)
Potassium: 4.4 mmol/L (ref 3.5–5.3)
Sodium: 140 mmol/L (ref 135–146)
Total Bilirubin: 1.6 mg/dL — ABNORMAL HIGH (ref 0.2–1.2)
Total Protein: 6.3 g/dL (ref 6.1–8.1)

## 2019-12-24 LAB — LIPID PANEL W/REFLEX DIRECT LDL
Cholesterol: 73 mg/dL (ref <200)
HDL: 35 mg/dL — ABNORMAL LOW (ref >=40)
LDL Cholesterol (Calc): 25 mg/dL (calc)
Non-HDL Cholesterol (Calc): 38 mg/dL (calc) (ref <130)
Total CHOL/HDL Ratio: 2.1 (calc) (ref <5.0)
Triglycerides: 57 mg/dL (ref <150)

## 2019-12-24 LAB — CBC WITH DIFFERENTIAL/PLATELET
Absolute Monocytes: 1054 cells/uL — ABNORMAL HIGH (ref 200–950)
Basophils Absolute: 42 cells/uL (ref 0–200)
Basophils Relative: 0.5 %
Eosinophils Absolute: 166 cells/uL (ref 15–500)
Eosinophils Relative: 2 %
HCT: 38.4 % — ABNORMAL LOW (ref 38.5–50.0)
Hemoglobin: 12.5 g/dL — ABNORMAL LOW (ref 13.2–17.1)
Lymphs Abs: 1577 cells/uL (ref 850–3900)
MCH: 31.5 pg (ref 27.0–33.0)
MCHC: 32.6 g/dL (ref 32.0–36.0)
MCV: 96.7 fL (ref 80.0–100.0)
MPV: 9.4 fL (ref 7.5–12.5)
Monocytes Relative: 12.7 %
Neutro Abs: 5461 cells/uL (ref 1500–7800)
Neutrophils Relative %: 65.8 %
Platelets: 243 10*3/uL (ref 140–400)
RBC: 3.97 10*6/uL — ABNORMAL LOW (ref 4.20–5.80)
RDW: 12.8 % (ref 11.0–15.0)
Total Lymphocyte: 19 %
WBC: 8.3 10*3/uL (ref 3.8–10.8)

## 2019-12-24 LAB — PSA: PSA: <0.04 ng/mL (ref <=4.0)

## 2019-12-26 ENCOUNTER — Other Ambulatory Visit: Payer: Self-pay

## 2019-12-26 DIAGNOSIS — H353132 Nonexudative age-related macular degeneration, bilateral, intermediate dry stage: Secondary | ICD-10-CM | POA: Diagnosis not present

## 2019-12-26 DIAGNOSIS — Z961 Presence of intraocular lens: Secondary | ICD-10-CM | POA: Diagnosis not present

## 2019-12-26 DIAGNOSIS — H35372 Puckering of macula, left eye: Secondary | ICD-10-CM | POA: Diagnosis not present

## 2019-12-26 DIAGNOSIS — H35423 Microcystoid degeneration of retina, bilateral: Secondary | ICD-10-CM | POA: Diagnosis not present

## 2019-12-26 MED ORDER — ATORVASTATIN CALCIUM 20 MG PO TABS: 20.0000 mg | ORAL_TABLET | Freq: Every day | ORAL | 3 refills | Status: DC

## 2019-12-28 ENCOUNTER — Encounter: Payer: Self-pay | Admitting: Family Medicine

## 2019-12-29 ENCOUNTER — Telehealth: Payer: Self-pay

## 2019-12-29 NOTE — Progress Notes (Signed)
Spoke with patients wife regarding appointment on 01-02-20 at 9:30am . Per patients wife she would like to cancel appointments and would like to reschedule at a later time. Per patients wife she will give me a call when ready to reschedule.  Georgiana Shore ,[facility] Pharmacist Assistant [phone]

## 2020-01-02 ENCOUNTER — Telehealth: Payer: Medicare Other

## 2020-01-07 ENCOUNTER — Other Ambulatory Visit: Payer: Self-pay | Admitting: Cardiology

## 2020-01-21 ENCOUNTER — Other Ambulatory Visit: Payer: Self-pay | Admitting: Cardiology

## 2020-01-23 NOTE — Telephone Encounter (Signed)
Rx has been sent to the pharmacy electronically. ° °

## 2020-03-09 ENCOUNTER — Other Ambulatory Visit: Payer: Self-pay | Admitting: Family Medicine

## 2020-04-04 ENCOUNTER — Other Ambulatory Visit: Payer: Self-pay

## 2020-04-04 ENCOUNTER — Encounter: Payer: Self-pay | Admitting: Pulmonary Disease

## 2020-04-04 ENCOUNTER — Ambulatory Visit (INDEPENDENT_AMBULATORY_CARE_PROVIDER_SITE_OTHER): Payer: Medicare Other | Admitting: Pulmonary Disease

## 2020-04-04 VITALS — BP 132/78 | HR 55 | Temp 97.0°F | Ht 73.0 in | Wt 212.8 lb

## 2020-04-04 DIAGNOSIS — R0683 Snoring: Secondary | ICD-10-CM

## 2020-04-04 NOTE — Patient Instructions (Addendum)
Will arrange for home sleep study Will call to arrange for follow up after sleep study reviewed  

## 2020-04-04 NOTE — Progress Notes (Signed)
[facility]  Chief Complaint  Patient presents with  . Consult    Sleep consult    Constitutional:  BP 132/78 (BP Location: Left Arm, Cuff Size: Normal)   Pulse (!) 55   Temp (!) 97 F (36.1 C) (Other (Comment)) Comment (Src): wrist  Ht 6\' 1"  (1.854 m)   Wt 212 lb 12.8 oz (96.5 kg)   SpO2 99% Comment: room air  BMI 28.08 kg/m   Past Medical History:  Prostate cancer, Colon polyps, CAD, Cataract, CVA, Diverticulitis, DJD, HTN, Ischemic CM, Nephrolithiasis, Neuropathy, Psoriasis  Past Surgical History:  He  has a past surgical history that includes Cholecystectomy (1986); REVERSAL OF COLOSTOMY (1987); Wrist surgery (Left); Colostomy (1985); Knee arthroscopy (Left, 2003); Appendectomy (1985); Tonsillectomy (1946); Knee surgery (1956); Cataract extraction, bilateral (2013); Back surgery (1980); Eye surgery; Colon surgery (1980's); Parathyroidectomy (N/A, 05/12/2014); Colonoscopy; Cardiac catheterization (N/A, 11/04/2015); Cardiac catheterization (N/A, 11/04/2015); and Cardiac catheterization (N/A, 11/06/2015).  Brief Summary:  Jose Ware is a 86 y.o. male former smoker with sleep difficulties.       Subjective:   He was seen in 2011 by Dr. Elsworth Soho.  Had sleep study then, but didn't show significant sleep apnea.    Since then his sleep has gotten worse.  He snores some.  He wakes up frequently during the night.  He is a restless sleeper.  He has trouble staying awake during the day, and has fallen asleep during conversation.  He goes to sleep at 10 pm.  He falls asleep in 5 minutes.  He wakes up 4 to 6 times to use the bathroom.  He gets out of bed at 530 am.  He feels okay in the morning, but gets more tired as the day goes on.  He denies morning headache.  He does not use anything to help him fall sleep.  He drinks two cups of coffee in the morning and another cup around 6 pm.  He denies sleep walking, sleep talking, bruxism, or  nightmares.  There is no history of restless legs.  He denies sleep hallucinations, sleep paralysis, or cataplexy.  The Epworth score is 18 out of 24.    Physical Exam:   Appearance - well kempt   ENMT - no sinus tenderness, no oral exudate, no LAN, Mallampati 3 airway, no stridor, extensive dental work  Respiratory - equal breath sounds bilaterally, no wheezing or rales  CV - s1s2 regular rate and rhythm, no murmurs  Ext - no clubbing, no edema  Skin - no rashes  Psych - normal mood and affect   Sleep Tests:   PSG 01/09/10 >> AHI 1.5  Cardiac Tests:   Echo 11/05/15 >> EF 45 to 50%, mod LVH, grade 1 DD  Social History:  He  reports that he quit smoking about 37 years ago. He has a 74.00 pack-year smoking history. He has never used smokeless tobacco. He reports current alcohol use. He reports that he does not use drugs.  Family History:  His family history includes Alcohol abuse in his father; Anemia in his sister; Breast cancer in his sister; Cirrhosis in his father; Clotting disorder in his brother; Coronary artery disease in his brother and mother; Heart attack in his brother; Heart failure in his sister; Non-Hodgkin's lymphoma in his brother, brother, and sister.    Discussion:  He has snoring, sleep disruption, and daytime sleepiness.  He has history of CAD, HTN, and CVA.  I am concerned he  could have obstructive sleep apnea.  Assessment/Plan:   Snoring with excessive daytime sleepiness. - will need to arrange for a home sleep study  Coronary artery disease, ischemic cardiomyopathy with chronic systolic CHF. - followed by Dr. Peter Martinique with [facility]  Obesity. - discussed how weight can impact sleep and risk for sleep disordered breathing - discussed options to assist with weight loss: combination of diet modification, cardiovascular and strength training exercises  Cardiovascular risk. - had an extensive discussion regarding the adverse health  consequences related to untreated sleep disordered breathing - specifically discussed the risks for hypertension, coronary artery disease, cardiac dysrhythmias, cerebrovascular disease, and diabetes - lifestyle modification discussed  Safe driving practices. - discussed how sleep disruption can increase risk of accidents, particularly when driving - safe driving practices were discussed  Therapies for obstructive sleep apnea. - if the sleep study shows significant sleep apnea, then various therapies for treatment were reviewed: CPAP, oral appliance, and surgical interventions  Time Spent Involved in Patient Care on Day of Examination:  32 minutes  Follow up:  Patient Instructions  Will arrange for home sleep study Will call to arrange for follow up after sleep study reviewed  Medication List:   Allergies as of 04/04/2020      Reactions   Bee Venom Swelling   Facial swelling   Betadine [povidone Iodine] Other (See Comments)   blistering   Doxycycline    Possible drug rash- back of right lower leg and onto left lower leg as well      Medication List       Accurate as of April 04, 2020  9:39 AM. If you have any questions, ask your nurse or doctor.        acetaminophen 325 MG tablet Commonly known as: TYLENOL Take 650 mg by mouth as needed.   amLODipine 5 MG tablet Commonly known as: NORVASC Take 1 tablet by mouth once daily   aspirin EC 81 MG tablet Take 1 tablet (81 mg total) by mouth daily.   atorvastatin 20 MG tablet Commonly known as: LIPITOR Take 1 tablet (20 mg total) by mouth daily.   beta carotene w/minerals tablet Take 1 tablet by mouth daily.   carvedilol 3.125 MG tablet Commonly known as: COREG Take 1 tablet (3.125 mg total) by mouth 2 (two) times daily.   losartan 100 MG tablet Commonly known as: COZAAR Take 1 tablet by mouth once daily   nitroGLYCERIN 0.4 MG SL tablet Commonly known as: NITROSTAT Place 1 tablet (0.4 mg total) under the  tongue every 5 (five) minutes x 3 doses as needed for chest pain.   spironolactone 25 MG tablet Commonly known as: ALDACTONE Take 1 tablet by mouth once daily   Vitamin B-12 5000 MCG Tbdp Take 5,000 mcg by mouth daily.       Signature:  Chesley Mires, MD [facility] Pager - [phone] 04/04/2020, 9:39 AM

## 2020-04-07 ENCOUNTER — Other Ambulatory Visit: Payer: Self-pay | Admitting: Cardiology

## 2020-04-11 ENCOUNTER — Other Ambulatory Visit: Payer: Self-pay

## 2020-04-11 ENCOUNTER — Ambulatory Visit: Payer: Medicare Other

## 2020-04-11 DIAGNOSIS — G4733 Obstructive sleep apnea (adult) (pediatric): Secondary | ICD-10-CM | POA: Diagnosis not present

## 2020-04-11 DIAGNOSIS — R0683 Snoring: Secondary | ICD-10-CM

## 2020-04-30 NOTE — Telephone Encounter (Signed)
Dr Halford Chessman, please advise once you have reviewed the sleep study. Thanks!

## 2020-05-01 DIAGNOSIS — G4733 Obstructive sleep apnea (adult) (pediatric): Secondary | ICD-10-CM | POA: Diagnosis not present

## 2020-05-01 NOTE — Telephone Encounter (Signed)
HST 04/11/20 >> AHI 24.4, SpO2 low 82%   Please inform him that his sleep study shows moderate obstructive sleep apnea.  Please arrange for ROV with me or NP to discuss treatment options.

## 2020-05-08 ENCOUNTER — Encounter: Payer: Self-pay | Admitting: Adult Health

## 2020-05-08 ENCOUNTER — Other Ambulatory Visit: Payer: Self-pay

## 2020-05-08 ENCOUNTER — Ambulatory Visit: Payer: Medicare Other | Admitting: Adult Health

## 2020-05-08 DIAGNOSIS — G4733 Obstructive sleep apnea (adult) (pediatric): Secondary | ICD-10-CM

## 2020-05-08 NOTE — Progress Notes (Signed)
@Patient  ID: Jose Ware, male    DOB: 1934-10-31, 86 y.o.   MRN: [id number]  Chief Complaint  Patient presents with  . Follow-up    Referring provider: [name], MD  HPI: 86 year old male seen for sleep consult April 04, 2020 for restless sleep, daytime sleepiness.  Patient was set up for a home sleep study that showed moderate sleep apnea. Medical history Hypertension, CVA, CAD , Cardiomyopathy   TEST/EVENTS :  HST 04/11/20 >> AHI 24.4, SpO2 low 82%   PSG 01/09/10 >> AHI 1.5  Cardiac Tests:   Echo 11/05/15 >> EF 45 to 50%, mod LVH, grade 1 DD  05/08/2020 Follow up : OSA  Patient returns for a 53-month follow-up.  Patient was seen last visit for a sleep consult.  He had restless sleep and daytime sleepiness.  He was set up for home sleep study that showed moderate sleep apnea with SPO2 low at 82%.  AHI was 24.4./Hour. Previously had been checked for sleep apnea in 2011 with no significant sleep apnea. We discussed his sleep study results.  Went over treatment options including weight loss, oral appliance and CPAP.  Patient would like to proceed with CPAP .     Allergies  Allergen Reactions  . Bee Venom Swelling    Facial swelling  . Betadine [Povidone Iodine] Other (See Comments)    blistering  . Doxycycline     Possible drug rash- back of right lower leg and onto left lower leg as well    Immunization History  Administered Date(s) Administered  . Fluad Quad(high Dose 65+) 12/17/2018, 12/23/2019  . Influenza Split 01/06/2012  . Influenza Whole 12/19/2008, 12/27/2009  . Influenza, High Dose Seasonal PF 12/21/2012, 12/22/2014, 02/05/2017, 12/08/2017  . Influenza,inj,Quad PF,6+ Mos 12/01/2013, 10/24/2015  . PFIZER(Purple Top)SARS-COV-2 Vaccination 04/07/2019, 04/21/2019, 12/08/2019  . Pneumococcal Conjugate-13 09/14/2015  . Pneumococcal Polysaccharide-23 02/12/2009  . Td 03/12/2003  . Tdap 02/27/2014  . Zoster Recombinat (Shingrix) 11/24/2017,  01/26/2018    Past Medical History:  Diagnosis Date  . Adenocarcinoma of prostate [facility])    XRT & Radiation seed implantation in 2010  . Adenomatous colon polyp   . CAD (coronary artery disease)    a. 11/04/15:  Acute inferolateral STEMI: S/p emergent DES of a very large LCx with extensive thrombus. Significant residual disease in the proximal LAD and distal RCA. S/p staged PCI of LAD and RCA 8/29.  . Cataract   . CVA (cerebral infarction)    a. 08/7122: embolic CVA after heart cath   . CVA (cerebral infarction)    a. 07/8097: embolic CVA after heart cath   . DIVERTICULITIS, HX OF 11/27/2006       . DJD (degenerative joint disease)    wrist - R   . Hernia   . History of blood transfusion 1985   with colon surgery  . Hypertension   . Ischemic cardiomyopathy    a. EF 25-30% by LV gram on cath 11/04/15. Appeared out of proportion to infarct although infarct was large. EF improved by Echo and now 40-45%.  . Myocardial infarction ([place])   . NEPHROLITHIASIS, HX OF 11/27/2006        . Peripheral neuropathy   . Psoriasis   . Tenosynovitis     Tobacco History: Social History   Tobacco Use  Smoking Status Former Smoker  . Packs/day: 2.00  . Years: 37.00  . Pack years: 74.00  . Quit date: 03/11/1983  . Years since quitting: 37.1  Smokeless  Tobacco Never Used  Tobacco Comment   started smoking in the service; ICU 85' part of his stomach; appendix; coma    Counseling given: Not Answered Comment: started smoking in the service; ICU 85' part of his stomach; appendix; coma    Outpatient Medications Prior to Visit  Medication Sig Dispense Refill  . acetaminophen (TYLENOL) 325 MG tablet Take 650 mg by mouth as needed.    Marland Kitchen amLODipine (NORVASC) 5 MG tablet Take 1 tablet by mouth once daily 90 tablet 0  . aspirin EC 81 MG tablet Take 1 tablet (81 mg total) by mouth daily. 90 tablet 1  . atorvastatin (LIPITOR) 20 MG tablet Take 1 tablet (20 mg total) by mouth daily. 90 tablet 3  . beta  carotene w/minerals (OCUVITE) tablet Take 1 tablet by mouth daily.    . carvedilol (COREG) 3.125 MG tablet Take 1 tablet (3.125 mg total) by mouth 2 (two) times daily. 180 tablet 3  . Cyanocobalamin (VITAMIN B-12) 5000 MCG TBDP Take 5,000 mcg by mouth daily.    Marland Kitchen losartan (COZAAR) 100 MG tablet Take 1 tablet by mouth once daily 90 tablet 1  . nitroGLYCERIN (NITROSTAT) 0.4 MG SL tablet Place 1 tablet (0.4 mg total) under the tongue every 5 (five) minutes x 3 doses as needed for chest pain. 25 tablet 2  . spironolactone (ALDACTONE) 25 MG tablet Take 1 tablet by mouth once daily 90 tablet 0   No facility-administered medications prior to visit.     Review of Systems:   Constitutional:   No  weight loss, night sweats,  Fevers, chills, fatigue, or  lassitude.  HEENT:   No headaches,  Difficulty swallowing,  Tooth/dental problems, or  Sore throat,                No sneezing, itching, ear ache, nasal congestion, post nasal drip,   CV:  No chest pain,  Orthopnea, PND, swelling in lower extremities, anasarca, dizziness, palpitations, syncope.   GI  No heartburn, indigestion, abdominal pain, nausea, vomiting, diarrhea, change in bowel habits, loss of appetite, bloody stools.   Resp: No shortness of breath with exertion or at rest.  No excess mucus, no productive cough,  No non-productive cough,  No coughing up of blood.  No change in color of mucus.  No wheezing.  No chest wall deformity  Skin: no rash or lesions.  GU: no dysuria, change in color of urine, no urgency or frequency.  No flank pain, no hematuria   MS:  No joint pain or swelling.  No decreased range of motion.  No back pain.    Physical Exam  BP 120/60 (BP Location: Left Arm, Patient Position: Sitting, Cuff Size: Normal)   Pulse 63   Temp (!) 97.2 F (36.2 C) (Temporal)   Ht 6\' 1"  (1.854 m)   Wt 207 lb 12.8 oz (94.3 kg)   SpO2 96%   BMI 27.42 kg/m   GEN: A/Ox3; pleasant , NAD, well nourished    HEENT:  [place]/AT,     NOSE-clear, THROAT-clear, no lesions, no postnasal drip or exudate noted.  Class 2-3 MP airway , partial upper plate  NECK:  Supple w/ fair ROM; no JVD; normal carotid impulses w/o bruits; no thyromegaly or nodules palpated; no lymphadenopathy.    RESP  Clear  P & A; w/o, wheezes/ rales/ or rhonchi. no accessory muscle use, no dullness to percussion  CARD:  RRR, no m/r/g, tr  peripheral edema, pulses intact, no cyanosis or  clubbing.  GI:   Soft & nt; nml bowel sounds; no organomegaly or masses detected.   Musco: Warm bil, no deformities or joint swelling noted.   Neuro: alert, no focal deficits noted.    Skin: Warm, no lesions or rashes    Lab Results:   BNP No results found for: BNP  ProBNP No results found for: PROBNP  Imaging: No results found.    No flowsheet data found.  No results found for: NITRICOXIDE      Assessment & Plan:   OSA (obstructive sleep apnea) Moderate OSA  Patient education was reviewed on sleep apnea and treatment options Patient would like to proceed with CPAP. We will begin with CPAP AutoSet 5-15 with mask of choice.  We did discuss mask options.  He would like to try the DreamWear nasal mask.  Plan  Patient Instructions  Begin CPAP at bedtime Use the Dream wear nasal mask .  Try to wear your CPAP all night long at least for 4 to 6 hours or more Work on healthy weight Do not drive if sleepy Follow-up in 3 months with Dr. Halford Chessman  Or Madelena Maturin NP and As needed            Rexene Edison, NP 05/08/2020

## 2020-05-08 NOTE — Addendum Note (Signed)
Addended by: Vanessa Barbara on: 05/08/2020 02:59 PM   Modules accepted: Orders

## 2020-05-08 NOTE — Assessment & Plan Note (Signed)
Moderate OSA  Patient education was reviewed on sleep apnea and treatment options Patient would like to proceed with CPAP. We will begin with CPAP AutoSet 5-15 with mask of choice.  We did discuss mask options.  He would like to try the DreamWear nasal mask.  Plan  Patient Instructions  Begin CPAP at bedtime Use the Dream wear nasal mask .  Try to wear your CPAP all night long at least for 4 to 6 hours or more Work on healthy weight Do not drive if sleepy Follow-up in 3 months with Dr. Halford Chessman  Or Mccartney Brucks NP and As needed

## 2020-05-08 NOTE — Patient Instructions (Addendum)
Begin CPAP at bedtime Use the Dream wear nasal mask .  Try to wear your CPAP all night long at least for 4 to 6 hours or more Work on healthy weight Do not drive if sleepy Follow-up in 3 months with Dr. Halford Chessman  Or Ennifer Harston NP and As needed

## 2020-05-08 NOTE — Progress Notes (Signed)
Reviewed and agree with assessment/plan.   Chesley Mires, MD [facility] Pulmonary/Critical [facility] 05/08/2020, 3:31 PM Pager:  [phone]

## 2020-05-10 ENCOUNTER — Ambulatory Visit (INDEPENDENT_AMBULATORY_CARE_PROVIDER_SITE_OTHER): Payer: Medicare Other

## 2020-05-10 DIAGNOSIS — Z0000 Encounter for general adult medical examination without abnormal findings: Secondary | ICD-10-CM

## 2020-05-10 NOTE — Patient Instructions (Addendum)
Jose Ware , Thank you for taking time to come for your Medicare Wellness Visit. I appreciate your ongoing commitment to your health goals. Please review the following plan we discussed and let me know if I can assist you in the future.   Screening recommendations/referrals: Colonoscopy: Done 12/15/17 Recommended yearly ophthalmology/optometry visit for glaucoma screening and checkup Recommended yearly dental visit for hygiene and checkup  Vaccinations: Influenza vaccine: Done 12/23/19 Pneumococcal vaccine: Up to date Tdap vaccine: Up to date Shingles vaccine: Completed 9/17 & 01/26/18   Covid-19: Completed 1/28, 2/11, 12/08/19  Advanced directives: Please bring a copy of your health care power of attorney and living will to the office at your convenience.  Conditions/risks identified: Get to walking better without staggering  Next appointment: Follow up in one year for your annual wellness visit.   Preventive Care 38 Years and Older, Male Preventive care refers to lifestyle choices and visits with your health care provider that can promote health and wellness. What does preventive care include?  A yearly physical exam. This is also called an annual well check.  Dental exams once or twice a year.  Routine eye exams. Ask your health care provider how often you should have your eyes checked.  Personal lifestyle choices, including:  Daily care of your teeth and gums.  Regular physical activity.  Eating a healthy diet.  Avoiding tobacco and drug use.  Limiting alcohol use.  Practicing safe sex.  Taking low doses of aspirin every day.  Taking vitamin and mineral supplements as recommended by your health care provider. What happens during an annual well check? The services and screenings done by your health care provider during your annual well check will depend on your age, overall health, lifestyle risk factors, and family history of disease. Counseling  Your health  care provider may ask you questions about your:  Alcohol use.  Tobacco use.  Drug use.  Emotional well-being.  Home and relationship well-being.  Sexual activity.  Eating habits.  History of falls.  Memory and ability to understand (cognition).  Work and work Statistician. Screening  You may have the following tests or measurements:  Height, weight, and BMI.  Blood pressure.  Lipid and cholesterol levels. These may be checked every 5 years, or more frequently if you are over 52 years old.  Skin check.  Lung cancer screening. You may have this screening every year starting at age 27 if you have a 30-pack-year history of smoking and currently smoke or have quit within the past 15 years.  Fecal occult blood test (FOBT) of the stool. You may have this test every year starting at age 31.  Flexible sigmoidoscopy or colonoscopy. You may have a sigmoidoscopy every 5 years or a colonoscopy every 10 years starting at age 58.  Prostate cancer screening. Recommendations will vary depending on your family history and other risks.  Hepatitis C blood test.  Hepatitis B blood test.  Sexually transmitted disease (STD) testing.  Diabetes screening. This is done by checking your blood sugar (glucose) after you have not eaten for a while (fasting). You may have this done every 1-3 years.  Abdominal aortic aneurysm (AAA) screening. You may need this if you are a current or former smoker.  Osteoporosis. You may be screened starting at age 46 if you are at high risk. Talk with your health care provider about your test results, treatment options, and if necessary, the need for more tests. Vaccines  Your health care provider may recommend  certain vaccines, such as:  Influenza vaccine. This is recommended every year.  Tetanus, diphtheria, and acellular pertussis (Tdap, Td) vaccine. You may need a Td booster every 10 years.  Zoster vaccine. You may need this after age  52.  Pneumococcal 13-valent conjugate (PCV13) vaccine. One dose is recommended after age 28.  Pneumococcal polysaccharide (PPSV23) vaccine. One dose is recommended after age 13. Talk to your health care provider about which screenings and vaccines you need and how often you need them. This information is not intended to replace advice given to you by your health care provider. Make sure you discuss any questions you have with your health care provider. Document Released: 03/23/2015 Document Revised: 11/14/2015 Document Reviewed: 12/26/2014 Elsevier Interactive Patient Education  2017 [place] Prevention in the Home Falls can cause injuries. They can happen to people of all ages. There are many things you can do to make your home safe and to help prevent falls. What can I do on the outside of my home?  Regularly fix the edges of walkways and driveways and fix any cracks.  Remove anything that might make you trip as you walk through a door, such as a raised step or threshold.  Trim any bushes or trees on the path to your home.  Use bright outdoor lighting.  Clear any walking paths of anything that might make someone trip, such as rocks or tools.  Regularly check to see if handrails are loose or broken. Make sure that both sides of any steps have handrails.  Any raised decks and porches should have guardrails on the edges.  Have any leaves, snow, or ice cleared regularly.  Use sand or salt on walking paths during winter.  Clean up any spills in your garage right away. This includes oil or grease spills. What can I do in the bathroom?  Use night lights.  Install grab bars by the toilet and in the tub and shower. Do not use towel bars as grab bars.  Use non-skid mats or decals in the tub or shower.  If you need to sit down in the shower, use a plastic, non-slip stool.  Keep the floor dry. Clean up any water that spills on the floor as soon as it happens.  Remove  soap buildup in the tub or shower regularly.  Attach bath mats securely with double-sided non-slip rug tape.  Do not have throw rugs and other things on the floor that can make you trip. What can I do in the bedroom?  Use night lights.  Make sure that you have a light by your bed that is easy to reach.  Do not use any sheets or blankets that are too big for your bed. They should not hang down onto the floor.  Have a firm chair that has side arms. You can use this for support while you get dressed.  Do not have throw rugs and other things on the floor that can make you trip. What can I do in the kitchen?  Clean up any spills right away.  Avoid walking on wet floors.  Keep items that you use a lot in easy-to-reach places.  If you need to reach something above you, use a strong step stool that has a grab bar.  Keep electrical cords out of the way.  Do not use floor polish or wax that makes floors slippery. If you must use wax, use non-skid floor wax.  Do not have throw rugs and other  things on the floor that can make you trip. What can I do with my stairs?  Do not leave any items on the stairs.  Make sure that there are handrails on both sides of the stairs and use them. Fix handrails that are broken or loose. Make sure that handrails are as long as the stairways.  Check any carpeting to make sure that it is firmly attached to the stairs. Fix any carpet that is loose or worn.  Avoid having throw rugs at the top or bottom of the stairs. If you do have throw rugs, attach them to the floor with carpet tape.  Make sure that you have a light switch at the top of the stairs and the bottom of the stairs. If you do not have them, ask someone to add them for you. What else can I do to help prevent falls?  Wear shoes that:  Do not have high heels.  Have rubber bottoms.  Are comfortable and fit you well.  Are closed at the toe. Do not wear sandals.  If you use a  stepladder:  Make sure that it is fully opened. Do not climb a closed stepladder.  Make sure that both sides of the stepladder are locked into place.  Ask someone to hold it for you, if possible.  Clearly mark and make sure that you can see:  Any grab bars or handrails.  First and last steps.  Where the edge of each step is.  Use tools that help you move around (mobility aids) if they are needed. These include:  Canes.  Walkers.  Scooters.  Crutches.  Turn on the lights when you go into a dark area. Replace any light bulbs as soon as they burn out.  Set up your furniture so you have a clear path. Avoid moving your furniture around.  If any of your floors are uneven, fix them.  If there are any pets around you, be aware of where they are.  Review your medicines with your doctor. Some medicines can make you feel dizzy. This can increase your chance of falling. Ask your doctor what other things that you can do to help prevent falls. This information is not intended to replace advice given to you by your health care provider. Make sure you discuss any questions you have with your health care provider. Document Released: 12/21/2008 Document Revised: 08/02/2015 Document Reviewed: 03/31/2014 Elsevier Interactive Patient Education  2017 [employer].

## 2020-05-10 NOTE — Progress Notes (Addendum)
Virtual Visit via Telephone Note  I connected with  Jose Ware on 05/10/20 at 10:15 AM EST by telephone and verified that I am speaking with the correct person using two identifiers.  Medicare Annual Wellness visit completed telephonically due to Covid-19 pandemic.   Persons participating in this call: This Health Coach and this patient.   Location: Patient: Home Provider: Office   I discussed the limitations, risks, security and privacy concerns of performing an evaluation and management service by telephone and the availability of in person appointments. The patient expressed understanding and agreed to proceed.  Unable to perform video visit due to video visit attempted and failed and/or patient does not have video capability.   Some vital signs may be absent or patient reported.   Willette Brace, LPN    Subjective:   Jose Ware is a 86 y.o. male who presents for Medicare Annual/Subsequent preventive examination.  Review of Systems     Cardiac Risk Factors include: advanced age (>76men, >14 women);hypertension;male gender     Objective:    Today's Vitals   05/10/20 1015  PainSc: 8    There is no height or weight on file to calculate BMI.  Advanced Directives 05/10/2020 04/04/2019 05/07/2017 11/04/2015 11/04/2015 08/28/2015 08/07/2015  Does Patient Have a Medical Advance Directive? Yes Yes Yes No No Yes No;Yes  Type of Advance Directive Living will Living will;Healthcare Power of [place] -  Does patient want to make changes to medical advance directive? - No - Patient declined - - - - -  Copy of [place] in Chart? - No - copy requested - - - - -  Would patient like information on creating a medical advance directive? - - - No - patient declined information - - -    Current Medications (verified) Outpatient Encounter Medications as of 05/10/2020  Medication Sig  . acetaminophen (TYLENOL) 325 MG tablet  Take 650 mg by mouth as needed.  Marland Kitchen amLODipine (NORVASC) 5 MG tablet Take 1 tablet by mouth once daily  . aspirin EC 81 MG tablet Take 1 tablet (81 mg total) by mouth daily.  Marland Kitchen atorvastatin (LIPITOR) 20 MG tablet Take 1 tablet (20 mg total) by mouth daily.  . beta carotene w/minerals (OCUVITE) tablet Take 1 tablet by mouth daily.  . carvedilol (COREG) 3.125 MG tablet Take 1 tablet (3.125 mg total) by mouth 2 (two) times daily.  . Cyanocobalamin (VITAMIN B-12) 5000 MCG TBDP Take 5,000 mcg by mouth daily.  Marland Kitchen losartan (COZAAR) 100 MG tablet Take 1 tablet by mouth once daily  . spironolactone (ALDACTONE) 25 MG tablet Take 1 tablet by mouth once daily  . nitroGLYCERIN (NITROSTAT) 0.4 MG SL tablet Place 1 tablet (0.4 mg total) under the tongue every 5 (five) minutes x 3 doses as needed for chest pain. (Patient not taking: Reported on 05/10/2020)   No facility-administered encounter medications on file as of 05/10/2020.    Allergies (verified) Bee venom, Betadine [povidone iodine], and Doxycycline   History: Past Medical History:  Diagnosis Date  . Adenocarcinoma of prostate [facility])    XRT & Radiation seed implantation in 2010  . Adenomatous colon polyp   . CAD (coronary artery disease)    a. 11/04/15:  Acute inferolateral STEMI: S/p emergent DES of a very large LCx with extensive thrombus. Significant residual disease in the proximal LAD and distal RCA. S/p staged PCI of LAD and RCA 8/29.  . Cataract   .  CVA (cerebral infarction)    a. 11/5619: embolic CVA after heart cath   . CVA (cerebral infarction)    a. 05/863: embolic CVA after heart cath   . DIVERTICULITIS, HX OF 11/27/2006       . DJD (degenerative joint disease)    wrist - R   . Hernia   . History of blood transfusion 1985   with colon surgery  . Hypertension   . Ischemic cardiomyopathy    a. EF 25-30% by LV gram on cath 11/04/15. Appeared out of proportion to infarct although infarct was large. EF improved by Echo and now 40-45%.  .  Myocardial infarction ([place])   . NEPHROLITHIASIS, HX OF 11/27/2006        . Peripheral neuropathy   . Psoriasis   . Tenosynovitis    Past Surgical History:  Procedure Laterality Date  . APPENDECTOMY  1985  . BACK SURGERY  1980   minor disk surgery: instrumentation placed and removed in a second procedure  . CARDIAC CATHETERIZATION N/A 11/04/2015   Procedure: Left Heart Cath and Coronary Angiography;  Surgeon: Peter M Martinique, MD;  Location: [place] CV LAB;  Service: Cardiovascular;  Laterality: N/A;  . CARDIAC CATHETERIZATION N/A 11/04/2015   Procedure: Coronary Stent Intervention;  Surgeon: Peter M Martinique, MD;  Location: [place] [facility] LAB;  Service: Cardiovascular;  Laterality: N/A;  . CARDIAC CATHETERIZATION N/A 11/06/2015   Procedure: Coronary Stent Intervention;  Surgeon: Leonie Man, MD;  Location: [place] [facility] LAB;  Service: Cardiovascular;  Laterality: N/A;  . CATARACT EXTRACTION, BILATERAL  2013  . CHOLECYSTECTOMY  1986  . COLON SURGERY  1980's   pt. had ileus, had colon surgery, requiring colostomy & then reversal & then dehisence of that wound & return to OR for repair & cholecystectomy    . COLONOSCOPY    . COLOSTOMY  1985   After colectomy for diverticulitis, pt. remarks during this surgery they "gave me the paddles two times  because of bleeding", pt. states it was unrelated to any anesthesia complication  . EYE SURGERY     /w IOL  . KNEE ARTHROSCOPY Left 2003  . KNEE SURGERY  1956   Left 1956, Right w/cartilage removed later  . PARATHYROIDECTOMY N/A 05/12/2014   Procedure: PARATHYROIDECTOMY;  Surgeon: Armandina Gemma, MD;  Location: [place];  Service: General;  Laterality: N/A;  . REVERSAL OF COLOSTOMY  1987  . TONSILLECTOMY  1946  . WRIST SURGERY Left    Remote complicated w/infection   Family History  Problem Relation Age of Onset  . Coronary artery disease Mother   . Alcohol abuse Father   . Cirrhosis Father   . Heart failure Sister   . Breast cancer Sister         W/involvement of right arm leading to amputation  . Coronary artery disease Brother   . Heart attack Brother   . Non-Hodgkin's lymphoma Brother   . Clotting disorder Brother   . Non-Hodgkin's lymphoma Brother   . Anemia Sister        related to treatment to lymphoma  . Non-Hodgkin's lymphoma Sister   . Prostate cancer Neg Hx   . Colon cancer Neg Hx   . Diabetes Neg Hx   . Glaucoma Neg Hx   . Rectal cancer Neg Hx   . Esophageal cancer Neg Hx    Social History   Socioeconomic History  . Marital status: Married    Spouse name: Not on file  .  Number of children: 3  . Years of education: Not on file  . Highest education level: Not on file  Occupational History  . Not on file  Tobacco Use  . Smoking status: Former Smoker    Packs/day: 2.00    Years: 37.00    Pack years: 74.00    Quit date: 03/11/1983    Years since quitting: 37.1  . Smokeless tobacco: Never Used  . Tobacco comment: started smoking in the service; ICU 85' part of his stomach; appendix; coma   Vaping Use  . Vaping Use: Never used  Substance and Sexual Activity  . Alcohol use: Yes    Alcohol/week: 0.0 standard drinks    Comment: RARE  . Drug use: No  . Sexual activity: Not on file  Other Topics Concern  . Not on file  Social History Narrative   [facility] [place], [place].   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 Difficulty of Paying Living Expenses: Not hard at all  Food Insecurity: No Food Insecurity  . Worried About Charity fundraiser in the Last Year: Never true  . Ran Out of Food in the Last Year: Never true   Transportation Needs: No Transportation Needs  . Lack of Transportation (Medical): No  . Lack of Transportation (Non-Medical): No  Physical Activity: Sufficiently Active  . Days of Exercise per Week: 4 days  . Minutes of Exercise per Session: 60 min  Stress: No Stress Concern Present  . Feeling of Stress : Not at all  Social Connections: Moderately Integrated  . Frequency of Communication with Friends and Family: Once a week  . Frequency of Social Gatherings with Friends and Family: Once a week  . Attends Religious Services: More than 4 times per year  . Active Member of Clubs or Organizations: Yes  . Attends Archivist Meetings: 1 to 4 times per year  . Marital Status: Married    Tobacco Counseling Counseling given: Not Answered Comment: started smoking in the service; ICU 73' part of his stomach; appendix; coma    Clinical Intake:  Pre-visit preparation completed: Yes  Pain : 0-10 Pain Score: 8  Pain Location: Generalized (Arthritis  pain) Pain Descriptors / Indicators: Aching,Stabbing     BMI - recorded: 27.42 Nutritional Status: BMI 25 -29 Overweight Nutritional Risks: None Diabetes: No  How often do you need to have someone help you when you read instructions, pamphlets, or other written materials from your doctor or pharmacy?: 1 - Never  Diabetic?No  Interpreter Needed?: No  Information entered by :: Charlott Rakes, LPN   Activities of Daily Living In your present state of health, do you have any difficulty performing the following activities: 05/10/2020  Hearing? N  Vision? N  Difficulty concentrating or making decisions? N  Walking or climbing stairs? N  Dressing or bathing? N  Doing errands, shopping? N  Preparing Food and eating ? N  Using the Toilet? N  In the past six months, have you  accidently leaked urine? Y  Comment wears a pad  Do you have problems with loss of bowel control? N  Managing your Medications? N  Managing your  Finances? N  Housekeeping or managing your Housekeeping? N  Some recent data might be hidden    Patient Care Team: [name], MD as PCP - General (Family Medicine) Lowella Bandy, MD (Inactive) (Urology) Ladene Artist, MD (Gastroenterology) Martinique, Peter M, MD as Consulting Physician (Cardiology) Keene Breath., MD as Consulting Physician (Ophthalmology) Madelin Rear, [facility] as Pharmacist (Pharmacist) Festus Aloe, MD as Consulting Physician (Urology)  Indicate any recent Medical Services you may have received from other than [facility] providers in the past year (date may be approximate).     Assessment:   This is a routine wellness examination for Jolly.  Hearing/Vision screen  Hearing Screening   125Hz  250Hz  500Hz  1000Hz  2000Hz  3000Hz  4000Hz  6000Hz  8000Hz   Right ear:           Left ear:           Comments: Pt denies any hearing issues   Vision Screening Comments: Pt follows up with Dr Sherral Hammers for annual eye exams   Dietary issues and exercise activities discussed: Current Exercise Habits: Home exercise routine, Type of exercise: walking;strength training/weights, Time (Minutes): 45, Frequency (Times/Week): 5, Weekly Exercise (Minutes/Week): 225  Goals    . Patient Stated     Continue with your exercise     . Patient Stated     Ambulate better without staggering       Depression Screen PHQ 2/9 Scores 05/10/2020 04/04/2019 12/17/2018 05/11/2018 05/07/2017 02/05/2017 03/14/2016  PHQ - 2 Score 0 0 0 0 0 0 0    Fall Risk Fall Risk  05/10/2020 06/20/2019 04/04/2019 12/17/2018 07/15/2018  Falls in the past year? 1 0 0 0 1  Number falls in past yr: 1 0 0 0 -  Injury with Fall? 0 0 0 0 -  Risk for fall due to : Impaired balance/gait;Impaired mobility - Impaired balance/gait;Impaired mobility - -  Follow up Falls prevention discussed - Education provided;Falls prevention discussed;Falls evaluation completed - -    FALL RISK PREVENTION PERTAINING TO THE HOME:  Any stairs in or  around the home? No  If so, are there any without handrails? No  Home free of loose throw rugs in walkways, pet beds, electrical cords, etc? Yes  Adequate lighting in your home to reduce risk of falls? Yes   ASSISTIVE DEVICES UTILIZED TO PREVENT FALLS:  Life alert? No  Use of a cane, walker or w/c? Yes  Grab bars in the bathroom? No  Shower chair or bench in shower? Yes  Elevated toilet seat or a handicapped toilet? Yes   TIMED UP AND GO:  Was the test performed? No    Cognitive Function: MMSE - Mini Mental State Exam 05/07/2017  Not completed: (No Data)     6CIT Screen 05/10/2020 04/04/2019  What Year? 0 points 0 points  What month? 0 points 0 points  What time? - 0 points  Count back from 20 0 points 0 points  Months in reverse 0 points 0 points  Repeat phrase 2 points 0 points  Total Score - 0    Immunizations Immunization History  Administered Date(s) Administered  . Fluad Quad(high Dose 65+) 12/17/2018, 12/23/2019  . Influenza Split 01/06/2012  . Influenza Whole 12/19/2008, 12/27/2009  . Influenza, High Dose Seasonal PF 12/21/2012, 12/22/2014, 02/05/2017, 12/08/2017  . Influenza,inj,Quad PF,6+ Mos 12/01/2013, 10/24/2015  .  PFIZER(Purple Top)SARS-COV-2 Vaccination 04/07/2019, 04/21/2019, 12/08/2019  . Pneumococcal Conjugate-13 09/14/2015  . Pneumococcal Polysaccharide-23 02/12/2009  . Td 03/12/2003  . Tdap 02/27/2014  . Zoster Recombinat (Shingrix) 11/24/2017, 01/26/2018    TDAP status: Up to date  Flu Vaccine status: Up to date  Pneumococcal vaccine status: Up to date  Covid-19 vaccine status: Completed vaccines  Qualifies for Shingles Vaccine? Yes   Zostavax completed Yes   Shingrix Completed?: Yes  Screening Tests Health Maintenance  Topic Date Due  . COLONOSCOPY (Pts 45-49yrs Insurance coverage will need to be confirmed)  12/16/2018  . COVID-19 Vaccine (4 - Booster) 06/06/2020  . TETANUS/TDAP  02/28/2024  . INFLUENZA VACCINE  Completed  . PNA  vac Low Risk Adult  Completed  . HPV VACCINES  Aged Out    Health Maintenance  Health Maintenance Due  Topic Date Due  . COLONOSCOPY (Pts 45-68yrs Insurance coverage will need to be confirmed)  12/16/2018    Colorectal cancer screening: Type of screening: Colonoscopy. Completed 12/15/17. Repeat every 1 years pt stated he wants to make an appt at a time convienent for him.   Additional Screening  Vision Screening: Recommended annual ophthalmology exams for early detection of glaucoma and other disorders of the eye. Is the patient up to date with their annual eye exam?  Yes  Who is the provider or what is the name of the office in which the patient attends annual eye exams? Dr Sherral Hammers  If pt is not established with a provider, would they like to be referred to a provider to establish care? No .   Dental Screening: Recommended annual dental exams for proper oral hygiene  Community Resource Referral / Chronic Care Management: CRR required this visit?  No   CCM required this visit?  No      Plan:     I have personally reviewed and noted the following in the patient's chart:   . Medical and social history . Use of alcohol, tobacco or illicit drugs  . Current medications and supplements . Functional ability and status . Nutritional status . Physical activity . Advanced directives . List of other physicians . Hospitalizations, surgeries, and ER visits in previous 12 months . Vitals . Screenings to include cognitive, depression, and falls . Referrals and appointments  In addition, I have reviewed and discussed with patient certain preventive protocols, quality metrics, and best practice recommendations. A written personalized care plan for preventive services as well as general preventive health recommendations were provided to patient.     Willette Brace, LPN   03/13/9700   Nurse Notes: None

## 2020-05-11 NOTE — Progress Notes (Signed)
05/14/2020 [facility]   August 11, 1934  [id number]  Primary Physician Yong Channel Brayton Mars, MD Primary Cardiologist: Dr Martinique  HPI:  Mr. Bellard is seen for follow up CAD. He has a history of HTN, remote tobacco abuse, psoriasis, prostate cancer s/p XRT and radiation seed therapy, adenomatous colon polyps and diverticulitis s/p previous abdominal surgeries.   He presented to [facility] on 11/04/15 as a inferolateral STEMI. Emergent cath revealed a very large LCx with extensive thrombus. He underwent PCI with DES. With reperfusion he had NSVT. The pt also had residual RCA and LAD disease and an EF of 25% at cath. Echo done 11/05/15 showed an EF of 40-45%. His Troponin peaked at 25. On 11/06/15 he was taken back to the lab for staged PCI to his LAD and RCA. This was successful but on 11/07/15 the pt related that he developed some word finding issues that started post PCI 8/29. MRI revealed scattered small acute infarcts in the bilateral anterior and posterior circulation.  CA dopplers revealed mild plaque (1-39%). The pt's symptoms improved.    On follow up today he reports he is doing well. He denies any chest pain or SOB. He tries to walk daily and goes to the gym 3x/week.  Tolerating medication well. He is seeing pulmonary now- has sleep apnea- moderate and is getting fitted for CPAP. He does note very frequent nocturia and is scheduled to see urology.  Current Outpatient Medications  Medication Sig Dispense Refill  . acetaminophen (TYLENOL) 325 MG tablet Take 650 mg by mouth as needed.    Marland Kitchen amLODipine (NORVASC) 5 MG tablet Take 1 tablet by mouth once daily 90 tablet 0  . aspirin EC 81 MG tablet Take 1 tablet (81 mg total) by mouth daily. 90 tablet 1  . atorvastatin (LIPITOR) 20 MG tablet Take 1 tablet (20 mg total) by mouth daily. 90 tablet 3  . beta carotene w/minerals (OCUVITE) tablet Take 1 tablet by mouth daily.    . carvedilol (COREG) 3.125 MG tablet Take 1 tablet (3.125 mg total) by mouth 2  (two) times daily. 180 tablet 3  . Cyanocobalamin (VITAMIN B-12) 5000 MCG TBDP Take 5,000 mcg by mouth daily.    Marland Kitchen losartan (COZAAR) 100 MG tablet Take 1 tablet by mouth once daily 90 tablet 1  . nitroGLYCERIN (NITROSTAT) 0.4 MG SL tablet Place 1 tablet (0.4 mg total) under the tongue every 5 (five) minutes x 3 doses as needed for chest pain. 25 tablet 2  . spironolactone (ALDACTONE) 25 MG tablet Take 1 tablet by mouth once daily 90 tablet 0   No current facility-administered medications for this visit.    Allergies  Allergen Reactions  . Bee Venom Swelling    Facial swelling  . Betadine [Povidone Iodine] Other (See Comments)    blistering  . Doxycycline     Possible drug rash- back of right lower leg and onto left lower leg as well    Social History   Socioeconomic History  . Marital status: Married    Spouse name: Not on file  . Number of children: 3  . Years of education: Not on file  . Highest education level: Not on file  Occupational History  . Not on file  Tobacco Use  . Smoking status: Former Smoker    Packs/day: 2.00    Years: 37.00    Pack years: 74.00    Quit date: 03/11/1983    Years since quitting: 37.2  . Smokeless tobacco: Never  Used  . Tobacco comment: started smoking in the service; ICU 85' part of his stomach; appendix; coma   Vaping Use  . Vaping Use: Never used  Substance and Sexual Activity  . Alcohol use: Yes    Alcohol/week: 0.0 standard drinks    Comment: RARE  . Drug use: No  . Sexual activity: Not on file  Other Topics Concern  . Not on file  Social History Narrative   [employer] [place], [place].   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 Difficulty of Paying Living Expenses: Not hard at all  Food Insecurity: No Food Insecurity  . Worried About Charity fundraiser in the Last Year: Never true  . Ran Out of Food in the Last Year: Never true  Transportation Needs: No Transportation Needs  . Lack of Transportation (Medical): No  . Lack of Transportation (Non-Medical): No  Physical Activity: Sufficiently Active  . Days of Exercise per Week: 4 days  . Minutes of Exercise per Session: 60 min  Stress: No Stress Concern Present  . Feeling of Stress : Not at all  Social Connections: Moderately Integrated  . Frequency of Communication with Friends and Family: Once a week  . Frequency of Social Gatherings with Friends and Family: Once a week  . Attends Religious Services: More than 4 times per year  . Active Member of Clubs or Organizations: Yes  . Attends Archivist Meetings: 1 to 4 times per year  . Marital Status: Married  Human resources officer Violence: Not At Risk  . Fear of Current or Ex-Partner: No  . Emotionally Abused: No  . Physically Abused: No  . Sexually Abused: No     Review of Systems: As noted in HPI.  All other systems reviewed and are otherwise negative except as noted above.  Blood pressure 112/62, pulse 61, height 6\' 1"  (1.854 m), weight 213 lb (96.6 kg), SpO2 94 %.  GENERAL:  Well appearing WM in NAD HEENT:  PERRL, EOMI, sclera are clear. Oropharynx is clear. NECK:  No jugular venous distention, carotid upstroke brisk and symmetric, no bruits, no thyromegaly or adenopathy LUNGS:  Clear to auscultation bilaterally CHEST:  Unremarkable HEART:  RRR,  PMI not displaced or sustained,S1 and S2 within normal limits, no S3, no S4: no clicks, no rubs, no murmurs ABD:  Soft, nontender. BS +, no masses or bruits. No hepatomegaly, no splenomegaly EXT:  2 + pulses throughout, no edema, no cyanosis no clubbing SKIN:  Warm and  dry.  No rashes NEURO:  Alert and oriented x 3. Cranial nerves II through XII intact. PSYCH:  Cognitively intact   Laboratory data:  Lab Results  Component Value Date   WBC 8.3 12/23/2019   HGB 12.5 (L) 12/23/2019   HCT 38.4 (L) 12/23/2019   PLT 243 12/23/2019   GLUCOSE 88 12/23/2019   CHOL 73 12/23/2019   TRIG 57 12/23/2019   HDL 35 (L) 12/23/2019   LDLDIRECT 31.0 11/06/2017   [place]  25 12/23/2019   ALT 8 (L) 12/23/2019   AST 10 12/23/2019   NA 140 12/23/2019   K 4.4 12/23/2019   CL 103 12/23/2019   CREATININE 1.10 12/23/2019   BUN 27 (H) 12/23/2019   CO2 28 12/23/2019   TSH 3.09 06/20/2019   PSA <0.04 12/23/2019   INR 1.08 11/04/2015   HGBA1C 5.3 11/04/2015    ASSESSMENT AND PLAN:  1. CAD s/p inferolateral STEMI in August 2017 with thrombotic occlusion of the LCx. S/p DES. Subsequent staged PCI of the proximal LAD and RCA. He has no anginal symptoms. Continue ASA. Continue low dose Coreg.  2. Ischemic LV dysfunction. Asymptomatic. On Coreg. Losartan, aldactone. No overt evidence of  CHF. 3. 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 No residual symptoms.  4. Hypertension. Well controlled  5. Hyperlipidemia. On high dose statin. Excellent control with LDL 25 6. Sleep apnea. CPAP per pulmonary 7. Nocturia. Suspect prostatism. To follow up with urology.  PLAN   Follow up in 6 months.  Fern Canova Martinique MD,FACC 05/14/2020 10:25 AM

## 2020-05-14 ENCOUNTER — Ambulatory Visit: Payer: Medicare Other | Admitting: Cardiology

## 2020-05-14 ENCOUNTER — Encounter: Payer: Self-pay | Admitting: Cardiology

## 2020-05-14 ENCOUNTER — Other Ambulatory Visit: Payer: Self-pay

## 2020-05-14 VITALS — BP 112/62 | HR 61 | Ht 73.0 in | Wt 213.0 lb

## 2020-05-14 DIAGNOSIS — E785 Hyperlipidemia, unspecified: Secondary | ICD-10-CM

## 2020-05-14 DIAGNOSIS — I2510 Atherosclerotic heart disease of native coronary artery without angina pectoris: Secondary | ICD-10-CM | POA: Diagnosis not present

## 2020-05-14 DIAGNOSIS — I10 Essential (primary) hypertension: Secondary | ICD-10-CM | POA: Diagnosis not present

## 2020-05-14 DIAGNOSIS — I255 Ischemic cardiomyopathy: Secondary | ICD-10-CM | POA: Diagnosis not present

## 2020-05-14 DIAGNOSIS — I5022 Chronic systolic (congestive) heart failure: Secondary | ICD-10-CM | POA: Diagnosis not present

## 2020-05-15 DIAGNOSIS — M25532 Pain in left wrist: Secondary | ICD-10-CM | POA: Insufficient documentation

## 2020-06-09 ENCOUNTER — Other Ambulatory Visit: Payer: Self-pay | Admitting: Family Medicine

## 2020-06-11 ENCOUNTER — Ambulatory Visit: Payer: Medicare Other | Admitting: Pulmonary Disease

## 2020-06-20 NOTE — Patient Instructions (Addendum)
Please stop by lab before you go If you have mychart- we will send your results within 3 business days of [place] receiving them.  If you do not have mychart- we will call you about results within 5 business days of [place] receiving them.  *please also note that you will see labs on mychart as soon as they post. I will later go in and write notes on them- will say "notes from Dr. Yong Channel"    Health Maintenance Due  Topic Date Due  . COLONOSCOPY - you agreed to call GI to schedule  [facility] contact Address: [address], [place], [place] [address] Phone: [phone]  12/16/2018  . COVID-19 Vaccine - he plans to do this  06/06/2020   The left wrist appears to be bothered by de Quervain's tenosynovitis-recommended thumb spica splint to be worn as much as possible and avoid any exercises at the gym which cause pain in that area.  We can refer to physical therapy or sports medicine if not improving within the next 3 weeks or so.  Can also try over-the-counter Voltaren gel which may provide some relief as well  For his knee- can also try voltaren gel- suspect arthritis related with bakers cyst  For his back- we will refer to physical therapy and if not improving can refer to sports medicine. Schedule a visit before you leave at the desk    Recommended follow up: No follow-ups on file.

## 2020-06-20 NOTE — Progress Notes (Signed)
Phone [phone] In person visit   Subjective:   Jose Ware is a 86 y.o. year old very pleasant male patient who presents for/with See problem oriented charting Chief Complaint  Patient presents with  . Hypertension  . Hyperlipidemia   This visit occurred during the SARS-CoV-2 public health emergency.  Safety protocols were in place, including screening questions prior to the visit, additional usage of staff PPE, and extensive cleaning of exam room while observing appropriate contact time as indicated for disinfecting solutions.   Past Medical History-  Patient Active Problem List   Diagnosis Date Noted  . CAD S/P PCI     Priority: High  . Ischemic cardiomyopathy     Priority: High  . History of embolic stroke     Priority: High  . Primary hyperparathyroidism ([place]) 12/28/2013    Priority: High  . Fatigue 12/01/2013    Priority: High  . History of prostate cancer 12/14/2008    Priority: High  . Post herpetic neuralgia 06/20/2017    Priority: Medium  . Spinal stenosis of lumbar region at multiple levels 04/30/2017    Priority: Medium  . Dyslipidemia 11/16/2015    Priority: Medium  . History of adenomatous polyp of colon 01/18/2015    Priority: Medium  . PSORIASIS 10/15/2007    Priority: Medium  . Essential hypertension 11/27/2006    Priority: Medium  . Drug reaction 02/05/2017    Priority: Low  . Senile purpura ([place]) 02/05/2017    Priority: Low  . History of ventricular tachycardia 11/08/2015    Priority: Low  . History of stomach cancer 12/01/2013    Priority: Low  . Lower back pain 03/24/2013    Priority: Low  . Bradycardia 09/23/2012    Priority: Low  . Bruising 09/23/2012    Priority: Low  . B12 deficiency 09/17/2010    Priority: Low  . PERIPHERAL NEUROPATHY 10/18/2007    Priority: Low  . DEGENERATIVE JOINT DISEASE 11/27/2006    Priority: Low  . NEPHROLITHIASIS, HX OF 11/27/2006    Priority: Low  . OSA (obstructive sleep apnea) 05/08/2020   . Onychomycosis 10/28/2017  . Spondylosis 06/25/2017    Medications- reviewed and updated Current Outpatient Medications  Medication Sig Dispense Refill  . acetaminophen (TYLENOL) 325 MG tablet Take 650 mg by mouth as needed.    Marland Kitchen amLODipine (NORVASC) 5 MG tablet Take 1 tablet by mouth once daily 90 tablet 0  . aspirin EC 81 MG tablet Take 1 tablet (81 mg total) by mouth daily. 90 tablet 1  . atorvastatin (LIPITOR) 20 MG tablet Take 1 tablet (20 mg total) by mouth daily. 90 tablet 3  . beta carotene w/minerals (OCUVITE) tablet Take 1 tablet by mouth daily.    . carvedilol (COREG) 3.125 MG tablet Take 1 tablet (3.125 mg total) by mouth 2 (two) times daily. 180 tablet 3  . Cyanocobalamin (VITAMIN B-12) 5000 MCG TBDP Take 5,000 mcg by mouth daily.    Marland Kitchen losartan (COZAAR) 100 MG tablet Take 1 tablet by mouth once daily 90 tablet 1  . nitroGLYCERIN (NITROSTAT) 0.4 MG SL tablet Place 1 tablet (0.4 mg total) under the tongue every 5 (five) minutes x 3 doses as needed for chest pain. 25 tablet 2  . spironolactone (ALDACTONE) 25 MG tablet Take 1 tablet by mouth once daily 90 tablet 0   No current facility-administered medications for this visit.     Objective:  BP 128/72   Pulse (!) 51   Temp (!) 96.4  F (35.8 C) (Temporal)   Ht 6\' 1"  (1.854 m)   Wt 210 lb 12.8 oz (95.6 kg)   SpO2 98%   BMI 27.81 kg/m  Gen: NAD, resting comfortably CV: bradycardic but regular no murmurs rubs or gallops Lungs: CTAB no crackles, wheeze, rhonchi Abdomen: soft/nontender/nondistended/normal bowel sounds. No rebound or guarding.  Ext: no edema Skin: warm, dry Neuro: grossly normal, moves all extremities, walks with cane Msk: Positive Finkelstein's test on left wrist, left knee with visible scar-Baker's cyst also noted- some pain along MCL with stress of ligament.  No reproducible low back pain on exam.    Assessment and Plan   #Hypertension S: Compliant with losartan 100 mg, spironolactone 25 mg, Coreg  3.125 mg twice daily, amlodipine 5 mg. BP Readings from Last 3 Encounters:  06/25/20 128/72  05/14/20 112/62  05/08/20 120/60   A/P: Stable. Continue current medications.    #Hyperlipidemia/CAD status post PCI after STEMI August 2017 S: Patient is compliant with atorvastatin 40 mg--> reduce to 20 mg October 2021 with LDL at 25.  LDL has been well under 70.  He is also compliant with aspirin- prior was on Plavix as well but had easy bruising/bleeding Lab Results  Component Value Date   CHOL 73 12/23/2019   HDL 35 (L) 12/23/2019   LDLCALC 25 12/23/2019   LDLDIRECT 31.0 11/06/2017   TRIG 57 12/23/2019   CHOLHDL 2.1 12/23/2019   A/P: CAD asymptomatic-continue current medication.  He just had a visit with cardiology in March-Dr. Martinique for hyperlipidemia-update direct LDL hoping to keep this definitely below 70 but I am okay if below 55  # B12 deficiency S: Current treatment/medication (oral vs. IM): 5000 units per day  Lab Results  Component Value Date   VITAMINB12 >1500 (H) 12/17/2018  A/P: hopefully stable- update b12 today   # Left wrist pain S:2-3 months of issues. Sometimes swells and cannot put watch on. No fall or injury.   Left knee has also been bothering him from time to time.   Also left low back is bothering him after standing for a while.  At least 1-2 years of pain. Arthritis at multiple levels on x-ray 07/16/2018.  A/P: The left wrist appears to be bothered by de Quervain's tenosynovitis-recommended thumb spica splint to be worn as much as possible and avoid any exercises at the gym which cause pain in that area.  We can refer to physical therapy or sports medicine if not improving within the next 3 weeks or so.  Can also try over-the-counter Voltaren gel which may provide some relief as well  For his knee- can also try voltaren gel- suspect arthritis related with bakers cyst  For his back- we will refer to physical therapy and if not improving can refer to sports  medicine. Schedule a visit before you leave at the desk   Other notes: 1.  Senile purpura- stable- ongoing aspirin likely contributes 2.  History primary hyperparathyroidism- treated with parathyroid surgery-we will continue to monitor calcium levels 3.  Refer to pulmonology after last visit due to concern for sleep apnea- he is still trying to get his CPAP at home- ordered 5 weeks ago. Hoping it will help with fatigue/daytime sleepiness  4. He agrees to call back for colonoscopy with history of large polyp incompletely removed.  5. He is going to look at doing his covid booster today    Recommended follow up: Return in about 6 months (around 12/25/2020) for physical or sooner if needed.  Future Appointments  Date Time Provider [place]  08/09/2020  2:00 PM Parrett, Fonnie Mu, NP [facility] None  05/17/2021 10:15 AM [facility] COACH [facility]   Lab/Order associations:   ICD-10-CM   1. Essential hypertension  I10 CBC with Differential/Platelet    Comprehensive metabolic panel    LDL cholesterol, direct  2. Dyslipidemia  E78.5 CBC with Differential/Platelet    Comprehensive metabolic panel    LDL cholesterol, direct  3. B12 deficiency  E53.8 Vitamin B12  4. Encounter for screening colonoscopy  Z12.11   5. Primary hyperparathyroidism (HCC) Chronic E21.0   6. Senile purpura (HCC) Chronic D69.2   7. Chronic left-sided low back pain without sciatica  M54.50 Ambulatory referral to Physical Therapy   G89.29    Return precautions advised.  Garret Reddish, MD

## 2020-06-25 ENCOUNTER — Other Ambulatory Visit: Payer: Self-pay

## 2020-06-25 ENCOUNTER — Ambulatory Visit (INDEPENDENT_AMBULATORY_CARE_PROVIDER_SITE_OTHER): Payer: Medicare Other | Admitting: Family Medicine

## 2020-06-25 ENCOUNTER — Encounter: Payer: Self-pay | Admitting: Family Medicine

## 2020-06-25 VITALS — BP 128/72 | HR 51 | Temp 96.4°F | Ht 73.0 in | Wt 210.8 lb

## 2020-06-25 DIAGNOSIS — E538 Deficiency of other specified B group vitamins: Secondary | ICD-10-CM | POA: Diagnosis not present

## 2020-06-25 DIAGNOSIS — Z1211 Encounter for screening for malignant neoplasm of colon: Secondary | ICD-10-CM

## 2020-06-25 DIAGNOSIS — D692 Other nonthrombocytopenic purpura: Secondary | ICD-10-CM | POA: Diagnosis not present

## 2020-06-25 DIAGNOSIS — I10 Essential (primary) hypertension: Secondary | ICD-10-CM

## 2020-06-25 DIAGNOSIS — M545 Low back pain, unspecified: Secondary | ICD-10-CM | POA: Diagnosis not present

## 2020-06-25 DIAGNOSIS — E210 Primary hyperparathyroidism: Secondary | ICD-10-CM

## 2020-06-25 DIAGNOSIS — E785 Hyperlipidemia, unspecified: Secondary | ICD-10-CM | POA: Diagnosis not present

## 2020-06-25 DIAGNOSIS — G8929 Other chronic pain: Secondary | ICD-10-CM | POA: Diagnosis not present

## 2020-06-25 LAB — COMPREHENSIVE METABOLIC PANEL
ALT: 7 U/L (ref 0–53)
AST: 9 U/L (ref 0–37)
Albumin: 3.5 g/dL (ref 3.5–5.2)
Alkaline Phosphatase: 71 U/L (ref 39–117)
BUN: 28 mg/dL — ABNORMAL HIGH (ref 6–23)
CO2: 29 mEq/L (ref 19–32)
Calcium: 8.8 mg/dL (ref 8.4–10.5)
Chloride: 104 mEq/L (ref 96–112)
Creatinine, Ser: 1.08 mg/dL (ref 0.40–1.50)
GFR: 63.2 mL/min (ref >60.00)
Glucose, Bld: 79 mg/dL (ref 70–99)
Potassium: 4.5 mEq/L (ref 3.5–5.1)
Sodium: 141 mEq/L (ref 135–145)
Total Bilirubin: 0.9 mg/dL (ref 0.2–1.2)
Total Protein: 6 g/dL (ref 6.0–8.3)

## 2020-06-25 LAB — CBC WITH DIFFERENTIAL/PLATELET
Basophils Absolute: 0.1 10*3/uL (ref 0.0–0.1)
Basophils Relative: 0.7 % (ref 0.0–3.0)
Eosinophils Absolute: 0.2 10*3/uL (ref 0.0–0.7)
Eosinophils Relative: 2.3 % (ref 0.0–5.0)
HCT: 37.2 % — ABNORMAL LOW (ref 39.0–52.0)
Hemoglobin: 12.4 g/dL — ABNORMAL LOW (ref 13.0–17.0)
Lymphocytes Relative: 15.2 % (ref 12.0–46.0)
Lymphs Abs: 1.2 10*3/uL (ref 0.7–4.0)
MCHC: 33.2 g/dL (ref 30.0–36.0)
MCV: 97.6 fl (ref 78.0–100.0)
Monocytes Absolute: 1 10*3/uL (ref 0.1–1.0)
Monocytes Relative: 12.6 % — ABNORMAL HIGH (ref 3.0–12.0)
Neutro Abs: 5.6 10*3/uL (ref 1.4–7.7)
Neutrophils Relative %: 69.2 % (ref 43.0–77.0)
Platelets: 244 10*3/uL (ref 150.0–400.0)
RBC: 3.81 Mil/uL — ABNORMAL LOW (ref 4.22–5.81)
RDW: 14.5 % (ref 11.5–15.5)
WBC: 8.1 10*3/uL (ref 4.0–10.5)

## 2020-06-25 LAB — VITAMIN B12: Vitamin B-12: >1506 pg/mL — ABNORMAL HIGH (ref 211–911)

## 2020-06-25 LAB — LDL CHOLESTEROL, DIRECT: Direct LDL: 33 mg/dL

## 2020-07-03 ENCOUNTER — Ambulatory Visit: Payer: Medicare Other | Admitting: Physical Therapy

## 2020-07-03 ENCOUNTER — Encounter: Payer: Self-pay | Admitting: Physical Therapy

## 2020-07-03 ENCOUNTER — Other Ambulatory Visit: Payer: Self-pay

## 2020-07-03 DIAGNOSIS — M545 Low back pain, unspecified: Secondary | ICD-10-CM

## 2020-07-03 DIAGNOSIS — G8929 Other chronic pain: Secondary | ICD-10-CM

## 2020-07-03 DIAGNOSIS — M25562 Pain in left knee: Secondary | ICD-10-CM

## 2020-07-03 DIAGNOSIS — M6281 Muscle weakness (generalized): Secondary | ICD-10-CM | POA: Diagnosis not present

## 2020-07-03 DIAGNOSIS — R262 Difficulty in walking, not elsewhere classified: Secondary | ICD-10-CM | POA: Diagnosis not present

## 2020-07-03 NOTE — Therapy (Signed)
[facility] [address] [place], [place], [address] Phone: [phone]   Fax:  [phone]  Physical Therapy Evaluation  Patient Details  Name: Jose Ware MRN: [id number] Date of Birth: 1934/11/09 Referring Provider (PT): Dr. Garret Reddish   Encounter Date: 07/03/2020   PT End of Session - 07/03/20 1255    Visit Number 1    Number of Visits 17    Date for PT Re-Evaluation 09/01/20    Authorization Type [employer] Medicare    PT Start Time 1300    PT Stop Time 1345    PT Time Calculation (min) 45 min    Activity Tolerance Patient tolerated treatment well    Behavior During Therapy [facility] for tasks assessed/performed           Past Medical History:  Diagnosis Date  . Adenocarcinoma of prostate [facility])    XRT & Radiation seed implantation in 2010  . Adenomatous colon polyp   . CAD (coronary artery disease)    a. 11/04/15:  Acute inferolateral STEMI: S/p emergent DES of a very large LCx with extensive thrombus. Significant residual disease in the proximal LAD and distal RCA. S/p staged PCI of LAD and RCA 8/29.  . Cataract   . CVA (cerebral infarction)    a. 123456: embolic CVA after heart cath   . CVA (cerebral infarction)    a. 123456: embolic CVA after heart cath   . DIVERTICULITIS, HX OF 11/27/2006       . DJD (degenerative joint disease)    wrist - R   . Hernia   . History of blood transfusion 1985   with colon surgery  . Hypertension   . Ischemic cardiomyopathy    a. EF 25-30% by LV gram on cath 11/04/15. Appeared out of proportion to infarct although infarct was large. EF improved by Echo and now 40-45%.  . Myocardial infarction ([place])   . NEPHROLITHIASIS, HX OF 11/27/2006        . Peripheral neuropathy   . Psoriasis   . Tenosynovitis     Past Surgical History:  Procedure Laterality Date  . APPENDECTOMY  1985  . BACK SURGERY  1980   minor disk surgery: instrumentation placed and removed in a second procedure  .  CARDIAC CATHETERIZATION N/A 11/04/2015   Procedure: Left Heart Cath and Coronary Angiography;  Surgeon: Peter M Martinique, MD;  Location: [place] [facility];  Service: Cardiovascular;  Laterality: N/A;  . CARDIAC CATHETERIZATION N/A 11/04/2015   Procedure: Coronary Stent Intervention;  Surgeon: Peter M Martinique, MD;  Location: [place] [facility];  Service: Cardiovascular;  Laterality: N/A;  . CARDIAC CATHETERIZATION N/A 11/06/2015   Procedure: Coronary Stent Intervention;  Surgeon: Leonie Man, MD;  Location: [place] [facility];  Service: Cardiovascular;  Laterality: N/A;  . CATARACT EXTRACTION, BILATERAL  2013  . CHOLECYSTECTOMY  1986  . COLON SURGERY  1980's   pt. had ileus, had colon surgery, requiring colostomy & then reversal & then dehisence of that wound & return to OR for repair & cholecystectomy    . COLONOSCOPY    . COLOSTOMY  1985   After colectomy for diverticulitis, pt. remarks during this surgery they "gave me the paddles two times  because of bleeding", pt. states it was unrelated to any anesthesia complication  . EYE SURGERY     /w IOL  . KNEE ARTHROSCOPY Left 2003  . KNEE SURGERY  1956   Left 1956, Right w/cartilage removed later  .  PARATHYROIDECTOMY N/A 05/12/2014   Procedure: PARATHYROIDECTOMY;  Surgeon: Armandina Gemma, MD;  Location: [place] [facility];  Service: General;  Laterality: N/A;  . REVERSAL OF COLOSTOMY  1987  . TONSILLECTOMY  1946  . WRIST SURGERY Left    Remote complicated w/infection    There were no vitals filed for this visit.    Subjective Assessment - 07/03/20 1306    Subjective Pt states the knee and back hurt but the knee is the worst. The pain gets worse as the day goes on. He started using a walking stick 86 years ago. He states that a walker caused back pain. Pt states the knee pain will happen most with walking and standing still like talking to people after church. He had menisci removed in L knee in 86. He states the knee will pop and click with movement. He  states that knee is "bone on bone." He states the pain is sharp and on the inside of the knee. Worst 7/10, Currently 3/10, Best 3/10. Aggs: standing for too long, downstairs/slopes, showers Eases: moving, med. He can only stand for about 4 mins. Pt states he is active and still goes to the gym to do the weight machines. He walks .25 miles and has pain after. Pt states the back pain has been going on for years. It started when he fell off into a snow bank. Pt states the back pain is L and lower back.  He denies NT. He thinks the knee started all the back problems. Aggs: bending lifting squatting worse with leaning Eases: sitting straight up in a chair, not crossing legs. Pt denies cancer red flags. Pt states he is afraid of falling due to previous fall that happened some time ago.    Diagnostic tests X-ray: none for L knee, degenerative changes L1-5    Patient Stated Goals Pt states he would like to be able to stand and have a conversation with friends without pain.    Currently in Pain? Yes    Pain Score 3     Pain Location Knee    Pain Orientation Left    Pain Descriptors / Indicators Aching;Sore;Sharp    Pain Type Chronic pain    Pain Onset More than a month ago    Pain Frequency Intermittent    Multiple Pain Sites Yes    Pain Score 2    Pain Location Back    Pain Orientation Left;Lower    Pain Type Chronic pain    Pain Onset More than a month ago    Pain Frequency Intermittent    Pain Relieving Factors meds              [facility] PT Assessment - 07/03/20 0001      Assessment   Medical Diagnosis Low back pain    Referring Provider (PT) Dr. Garret Reddish      Precautions   Precautions None      Restrictions   Weight Bearing Restrictions No      Balance Screen   Has the patient fallen in the past 6 months No    Has the patient had a decrease in activity level because of a fear of falling?  No    Is the patient reluctant to leave their home because of a fear of falling?  No       Home Environment   Living Environment Private residence    Type of [place] to enter    Entrance  Stairs-Number of Steps 1    Home Layout One level      Prior Function   Level of Independence Independent    Leisure volunteering, fishing, going to the gym 3x a week to do resistance training for UE and LE      Cognition   Overall Cognitive Status Within Functional Limits for tasks assessed      Observation/Other Assessments   Other Surveys  Oswestry Disability Index;Lower Extremity Functional Scale    Oswestry Disability Index  33% disability    Lower Extremity Functional Scale  51.25% function      Sensation   Light Touch Appears Intact      Functional Tests   Functional tests Squat;Sit to Stand      Squat   Comments unable to perform without UE support due to balance and L knee loading      Sit to Stand   Comments requires bilat UE support      Posture/Postural Control   Posture/Postural Control Postural limitations    Postural Limitations Flexed trunk;Decreased lumbar lordosis      Deep Tendon Reflexes   DTR Assessment Site Patella    Patella DTR 2+      ROM / Strength   AROM / PROM / Strength AROM;PROM;Strength      AROM   Overall AROM  Deficits    Overall AROM Comments L/S: >50% limitation in all directions, required CGA due to balance deficits, no pain with end range; L knee flexion 109, extension -11 deg flexion      PROM   Overall PROM Comments L knee ext: -5 deg, 112 flex      Strength   Overall Strength Comments L knee 4/5 ext, 4+/5 flex; R knee WNL      Flexibility   Soft Tissue Assessment /Muscle Length yes    Hamstrings sig limited in 90/90 position    Quadriceps significant limited with Ely's    ITB taut to palpation      Palpation   Spinal mobility sig extension limitation with UPA and CPA L1-5    SI assessment  L innominate ant rotation    Palpation comment hypertonicity of bilateral QL, lumbar para      Special Tests     Special Tests Lumbar;Sacrolliac Tests;Hip Special Tests;Knee Special Tests    Lumbar Tests Straight Leg Raise    Hip Special Tests  Ely's Test      Straight Leg Raise   Findings Negative      Ely's Test   Findings Positive    Side Left      Transfers   Five time sit to stand comments  unable to perform without UE support      Ambulation/Gait   Ambulation Distance (Feet) 35 Feet    Assistive device Other (Comment)   walking stick, chest height   Gait Pattern Antalgic;Shuffle;Trunk flexed      Balance   Balance Assessed Yes      Static Standing Balance   Static Standing Balance -  Activities  Single Leg Stance - Right Leg;Single Leg Stance - Left Leg    Static Standing - Comment/# of Minutes <10s                      Objective measurements completed on examination: See above findings.       [facility] Adult PT Treatment/Exercise - 07/03/20 0001      Exercises   Exercises Lumbar;Knee/Hip  Lumbar Exercises: Stretches   Single Knee to Chest Stretch Limitations --    Pelvic Tilt Limitations 10x      Lumbar Exercises: Supine   Bridge Limitations --    Other Supine Lumbar Exercises LTR 3s 10x      Knee/Hip Exercises: Stretches   Passive Hamstring Stretch 30 seconds;3 reps      Knee/Hip Exercises: Supine   Quad Sets 10 reps    Quad Sets Limitations 3s hold                  PT Education - 07/03/20 1255    Education Details MOI, diagnosis, prognosis, anatomy, exercise progression, DOMS expectations, muscle firing, HEP, POC    Person(s) Educated Patient    Methods Explanation;Demonstration;Verbal cues;Handout;Tactile cues    Comprehension Verbalized understanding;Returned demonstration;Verbal cues required;Tactile cues required            PT Short Term Goals - 07/03/20 1424      PT SHORT TERM GOAL #1   Title Pt will become independent with HEP in order to demonstrate synthesis of PT education.    Time 2    Period Weeks    Status New       PT SHORT TERM GOAL #2   Title Pt will be able to demonstrate >5 degs improvement in knee flexion and extension in order to demonstrate functional improvement in L LE function for self-care and house hold duties.    Time 4    Period Weeks    Status New      PT SHORT TERM GOAL #3   Title Pt will be able to perform 5XSTS in under 12  in order to demonstrate functional improvement above the cut off score for adults in that age group.    Time 4    Period Weeks    Status New             PT Long Term Goals - 07/03/20 1425      PT LONG TERM GOAL #1   Title Pt will demonstrate at least a 12.8 improvement in Oswestry Index in order to demonstrate a clinically significant change in LBP and function.    Time 6    Period Weeks    Status New      PT LONG TERM GOAL #2   Title Pt will have an at least >9 pt improvement in LEFS measure in order to demonstrate MCID improvement in daily function.    Time 6    Period Weeks    Status New      PT LONG TERM GOAL #3   Title Pt will be able to demonstrate  and report ability to stand for more than 20 mins order to demonstrate functional improvement in LE function, strength, balance, and mobility for safety with community activity.    Time 8    Period Weeks                  Plan - 07/03/20 1322    Clinical Impression Statement Pt is an 86 y.o. male presenting to PT eval for CC of L sided LBP and L knee pain. Pt presents with decreased L/S and L knee ROM, functional strength deficits, gait deviations, and balance deficits. Pt's s/s are consistent with L knee OA and L sided LBP that is likely caused by gait deviations and L LE strength deficits. Pt's LBP does not present with radiating/distal sx. Pt walks with walking stick at baseline and has bilateral  wrist ROM deficits that increased difficulty while using SPC. Pt's impairments limit his participation with ADL, community ambulation, and household/recreational activities. Pt would benefit  from continued skilled therapy to maximize current function, prevent further functional decline, and promote safety.    Personal Factors and Comorbidities Age;Comorbidity 1;Comorbidity 2;Time since onset of injury/illness/exacerbation    Examination-Activity Limitations Lift;Stand;Carry;Squat;Stairs;Transfers    Examination-Participation Restrictions Church;Yard Work;Volunteer;Community Activity;Shop;Other    Stability/Clinical Decision Making Stable/Uncomplicated    Clinical Decision Making Low    Rehab Potential Fair    PT Frequency 2x / week    PT Duration 8 weeks    PT Treatment/Interventions ADLs/Self Care Home Management;Electrical Stimulation;Cryotherapy;Iontophoresis 4mg /ml Dexamethasone;Moist Heat;Ultrasound;Traction;Therapeutic exercise;Therapeutic activities;Functional mobility training;Stair training;Gait training;Balance training;Neuromuscular re-education;Patient/family education;Orthotic Fit/Training;Manual techniques;Compression bandaging;Passive range of motion;Dry needling;Energy conservation;Taping;Spinal Manipulations;Joint Manipulations;Scar mobilization    PT Next Visit Plan quad STM, heel slide, bridge    PT Home Exercise Plan [email or web address]    Consulted and Agree with Plan of Care Patient           Patient will benefit from skilled therapeutic intervention in order to improve the following deficits and impairments:  Abnormal gait,Pain,Improper body mechanics,Decreased mobility,Increased muscle spasms,Impaired flexibility,Difficulty walking,Decreased balance,Decreased activity tolerance,Decreased endurance,Decreased range of motion,Decreased strength,Hypomobility  Visit Diagnosis: Pain, lumbar region  Chronic pain of left knee  Difficulty walking  Muscle weakness (generalized)     Problem List Patient Active Problem List   Diagnosis Date Noted  . OSA (obstructive sleep apnea) 05/08/2020  . Onychomycosis 10/28/2017  . Spondylosis 06/25/2017  . Post herpetic  neuralgia 06/20/2017  . Spinal stenosis of lumbar region at multiple levels 04/30/2017  . Drug reaction 02/05/2017  . Senile purpura ([place]) 02/05/2017  . Dyslipidemia 11/16/2015  . History of ventricular tachycardia 11/08/2015  . CAD S/P PCI   . Ischemic cardiomyopathy   . History of embolic stroke   . History of adenomatous polyp of colon 01/18/2015  . Primary hyperparathyroidism ([place]) 12/28/2013  . History of stomach cancer 12/01/2013  . Fatigue 12/01/2013  . Lower back pain 03/24/2013  . Bradycardia 09/23/2012  . Bruising 09/23/2012  . B12 deficiency 09/17/2010  . History of prostate cancer 12/14/2008  . PERIPHERAL NEUROPATHY 10/18/2007  . PSORIASIS 10/15/2007  . Essential hypertension 11/27/2006  . DEGENERATIVE JOINT DISEASE 11/27/2006  . NEPHROLITHIASIS, HX OF 11/27/2006    Daleen Bo PT, DPT 07/03/20 2:42 PM   [facility] [place], [place], [address] Phone: [phone]   Fax:  [phone]  Name: GLYN GERADS MRN: [id number] Date of Birth: May 04, 1934

## 2020-07-03 NOTE — Patient Instructions (Addendum)
Access Code: [email or web address] URL: [email or web address] Date: 07/03/2020 Prepared by: Daleen Bo  Exercises Supine Quad Set - 2 x daily - 7 x weekly - 2 sets - 10 reps - 3 hold Supine Lower Trunk Rotation - 2 x daily - 7 x weekly - 1 sets - 10 reps - 3 hold Seated Hamstring Stretch - 2 x daily - 7 x weekly - 1 sets - 3 reps - 30 hold

## 2020-07-07 ENCOUNTER — Other Ambulatory Visit: Payer: Self-pay | Admitting: Cardiology

## 2020-07-09 ENCOUNTER — Other Ambulatory Visit: Payer: Self-pay

## 2020-07-09 ENCOUNTER — Ambulatory Visit: Payer: Medicare Other | Admitting: Physical Therapy

## 2020-07-09 ENCOUNTER — Encounter: Payer: Self-pay | Admitting: Physical Therapy

## 2020-07-09 DIAGNOSIS — M6281 Muscle weakness (generalized): Secondary | ICD-10-CM | POA: Diagnosis not present

## 2020-07-09 DIAGNOSIS — G8929 Other chronic pain: Secondary | ICD-10-CM | POA: Diagnosis not present

## 2020-07-09 DIAGNOSIS — M545 Low back pain, unspecified: Secondary | ICD-10-CM | POA: Diagnosis not present

## 2020-07-09 DIAGNOSIS — M25562 Pain in left knee: Secondary | ICD-10-CM

## 2020-07-09 DIAGNOSIS — R262 Difficulty in walking, not elsewhere classified: Secondary | ICD-10-CM | POA: Diagnosis not present

## 2020-07-09 NOTE — Therapy (Signed)
[facility] [address] [place], [place], [address] Phone: [phone]   Fax:  [phone]  Physical Therapy Treatment  Patient Details  Name: Jose Ware MRN: [id number] Date of Birth: 13-Feb-1935 Referring Provider (PT): Dr. Garret Reddish   Encounter Date: 07/09/2020   PT End of Session - 07/09/20 1459    Visit Number 2    Number of Visits 17    Date for PT Re-Evaluation 09/01/20    Authorization Type [employer] Medicare    PT Start Time 1403    PT Stop Time 1450    PT Time Calculation (min) 47 min    Activity Tolerance Patient tolerated treatment well    Behavior During Therapy [facility] for tasks assessed/performed           Past Medical History:  Diagnosis Date  . Adenocarcinoma of prostate [facility])    XRT & Radiation seed implantation in 2010  . Adenomatous colon polyp   . CAD (coronary artery disease)    a. 11/04/15:  Acute inferolateral STEMI: S/p emergent DES of a very large LCx with extensive thrombus. Significant residual disease in the proximal LAD and distal RCA. S/p staged PCI of LAD and RCA 8/29.  . Cataract   . CVA (cerebral infarction)    a. 0/9326: embolic CVA after heart cath   . CVA (cerebral infarction)    a. 09/1243: embolic CVA after heart cath   . DIVERTICULITIS, HX OF 11/27/2006       . DJD (degenerative joint disease)    wrist - R   . Hernia   . History of blood transfusion 1985   with colon surgery  . Hypertension   . Ischemic cardiomyopathy    a. EF 25-30% by LV gram on cath 11/04/15. Appeared out of proportion to infarct although infarct was large. EF improved by Echo and now 40-45%.  . Myocardial infarction ([place])   . NEPHROLITHIASIS, HX OF 11/27/2006        . Peripheral neuropathy   . Psoriasis   . Tenosynovitis     Past Surgical History:  Procedure Laterality Date  . APPENDECTOMY  1985  . BACK SURGERY  1980   minor disk surgery: instrumentation placed and removed in a second procedure  .  CARDIAC CATHETERIZATION N/A 11/04/2015   Procedure: Left Heart Cath and Coronary Angiography;  Surgeon: Peter M Martinique, MD;  Location: [place] CV [facility];  Service: Cardiovascular;  Laterality: N/A;  . CARDIAC CATHETERIZATION N/A 11/04/2015   Procedure: Coronary Stent Intervention;  Surgeon: Peter M Martinique, MD;  Location: [place] CV [facility];  Service: Cardiovascular;  Laterality: N/A;  . CARDIAC CATHETERIZATION N/A 11/06/2015   Procedure: Coronary Stent Intervention;  Surgeon: Leonie Man, MD;  Location: [place] CV LAB;  Service: Cardiovascular;  Laterality: N/A;  . CATARACT EXTRACTION, BILATERAL  2013  . CHOLECYSTECTOMY  1986  . COLON SURGERY  1980's   pt. had ileus, had colon surgery, requiring colostomy & then reversal & then dehisence of that wound & return to OR for repair & cholecystectomy    . COLONOSCOPY    . COLOSTOMY  1985   After colectomy for diverticulitis, pt. remarks during this surgery they "gave me the paddles two times  because of bleeding", pt. states it was unrelated to any anesthesia complication  . EYE SURGERY     /w IOL  . KNEE ARTHROSCOPY Left 2003  . KNEE SURGERY  1956   Left 1956, Right w/cartilage removed later  .  PARATHYROIDECTOMY N/A 05/12/2014   Procedure: PARATHYROIDECTOMY;  Surgeon: Armandina Gemma, MD;  Location: [place] [facility];  Service: General;  Laterality: N/A;  . REVERSAL OF COLOSTOMY  1987  . TONSILLECTOMY  1946  . WRIST SURGERY Left    Remote complicated w/infection    There were no vitals filed for this visit.   Subjective Assessment - 07/09/20 1358    Subjective Pt states the knee does not hurt quite as much but he has not been doing quite as much. He reports not having trouble with HEP.    Diagnostic tests X-ray: none for L knee, degenerative changes L1-5    Patient Stated Goals Pt states he would like to be able to stand and have a conversation with friends without pain.    Currently in Pain? No/denies    Pain Score 0-No pain    Pain Onset More than  a month ago    Pain Onset More than a month ago                             [facility] Adult PT Treatment/Exercise - 07/09/20 0001      Transfers   Five time sit to stand comments  unable to perform without UE support      Ambulation/Gait   Ambulation Distance (Feet) 70 Feet    Assistive device Other (Comment)   walking stick, chest height       Gait Comments VC and TC for heel strike and toe off; CGA      Posture/Postural Control   Posture/Postural Control Postural limitations    Postural Limitations Flexed trunk;Decreased lumbar lordosis      Exercises   Exercises Lumbar;Knee/Hip      Lumbar Exercises: Stretches   Pelvic Tilt Limitations 10x      Lumbar Exercises: Standing   Other Standing Lumbar Exercises TKE Red TB 2x10      Lumbar Exercises: Seated   Long Arc Quad on Chair 20 reps    LAQ on Chair Weights (lbs) 3lbs      Lumbar Exercises: Supine   Bridge 10 reps    Bridge Limitations 2 sets    Other Supine Lumbar Exercises LTR 3s 10x      Knee/Hip Exercises: Stretches   Passive Hamstring Stretch 30 seconds;3 reps    Gastroc Stretch Limitations 30s 3x      Knee/Hip Exercises: Supine   Quad Sets 10 reps    Quad Sets Limitations 3s hold      Manual Therapy   Manual Therapy Joint mobilization;Soft tissue mobilization    Joint Mobilization short sitting LAD grade III- Mulligan belt    Soft tissue mobilization quad, rec fem and VL                  PT Education - 07/09/20 1425    Education Details anatomy, exercise progression, DOMS expectations, muscle firing, AD usage, HEP    Person(s) Educated Patient    Methods Explanation;Demonstration;Tactile cues;Verbal cues;Handout    Comprehension Verbalized understanding;Returned demonstration;Verbal cues required;Tactile cues required            PT Short Term Goals - 07/03/20 1424      PT SHORT TERM GOAL #1   Title Pt will become independent with HEP in order to demonstrate synthesis of PT  education.    Time 2    Period Weeks    Status New      PT SHORT TERM GOAL #  2   Title Pt will be able to demonstrate >5 degs improvement in knee flexion and extension in order to demonstrate functional improvement in L LE function for self-care and house hold duties.    Time 4    Period Weeks    Status New      PT SHORT TERM GOAL #3   Title Pt will be able to perform 5XSTS in under 12  in order to demonstrate functional improvement above the cut off score for adults in that age group.    Time 4    Period Weeks    Status New             PT Long Term Goals - 07/03/20 1425      PT LONG TERM GOAL #1   Title Pt will demonstrate at least a 12.8 improvement in Oswestry Index in order to demonstrate a clinically significant change in LBP and function.    Time 6    Period Weeks    Status New      PT LONG TERM GOAL #2   Title Pt will have an at least >9 pt improvement in LEFS measure in order to demonstrate MCID improvement in daily function.    Time 6    Period Weeks    Status New      PT LONG TERM GOAL #3   Title Pt will be able to demonstrate  and report ability to stand for more than 20 mins order to demonstrate functional improvement in LE function, strength, balance, and mobility for safety with community activity.    Time 8    Period Weeks                 Plan - 07/09/20 1459    Clinical Impression Statement Pt presents with no back and knee pain at today's session. Pt demonstrated good performance and technique with HEP with minor VC and TC for technique, holds, and positioning. Pt was able to progress L LE strengthening without increased pain. Pt had improved ext ROM following gastroc, HS stretch, and quad setting exercises. Pt demonstrate apprehension towards normal stride lengths due to fear of falling but with cuing and guarding, Pt was able to demonstrate step through patten with reciprocal arm swing, but did require consistent reminders to maintain upright  posture and gait. Plan to progress strengthening and balance as tolerated. Pt would benefit from continued skilled therapy to maximize current function, prevent further functional decline, and promote safety.    Personal Factors and Comorbidities Age;Comorbidity 1;Comorbidity 2;Time since onset of injury/illness/exacerbation    Examination-Activity Limitations Lift;Stand;Carry;Squat;Stairs;Transfers    Examination-Participation Restrictions Church;Yard Work;Volunteer;Community Activity;Shop;Other    Stability/Clinical Decision Making Stable/Uncomplicated    Rehab Potential Fair    PT Frequency 2x / week    PT Duration 8 weeks    PT Treatment/Interventions ADLs/Self Care Home Management;Electrical Stimulation;Cryotherapy;Iontophoresis 4mg /ml Dexamethasone;Moist Heat;Ultrasound;Traction;Therapeutic exercise;Therapeutic activities;Functional mobility training;Stair training;Gait training;Balance training;Neuromuscular re-education;Patient/family education;Orthotic Fit/Training;Manual techniques;Compression bandaging;Passive range of motion;Dry needling;Energy conservation;Taping;Spinal Manipulations;Joint Manipulations;Scar mobilization    PT Next Visit Plan quad STM, heel slide, bridge review, TKE review, side stepping, gait training with AD    PT Home Exercise Plan [id number]    Consulted and Agree with Plan of Care Patient           Patient will benefit from skilled therapeutic intervention in order to improve the following deficits and impairments:  Abnormal gait,Pain,Improper body mechanics,Decreased mobility,Increased muscle spasms,Impaired flexibility,Difficulty walking,Decreased balance,Decreased activity tolerance,Decreased endurance,Decreased range of motion,Decreased strength,Hypomobility  Visit Diagnosis: Chronic pain of left knee  Pain, lumbar region  Difficulty walking  Muscle weakness (generalized)     Problem List Patient Active Problem List   Diagnosis Date Noted  . OSA  (obstructive sleep apnea) 05/08/2020  . Onychomycosis 10/28/2017  . Spondylosis 06/25/2017  . Post herpetic neuralgia 06/20/2017  . Spinal stenosis of lumbar region at multiple levels 04/30/2017  . Drug reaction 02/05/2017  . Senile purpura ([place]) 02/05/2017  . Dyslipidemia 11/16/2015  . History of ventricular tachycardia 11/08/2015  . CAD S/P PCI   . Ischemic cardiomyopathy   . History of embolic stroke   . History of adenomatous polyp of colon 01/18/2015  . Primary hyperparathyroidism ([place]) 12/28/2013  . History of stomach cancer 12/01/2013  . Fatigue 12/01/2013  . Lower back pain 03/24/2013  . Bradycardia 09/23/2012  . Bruising 09/23/2012  . B12 deficiency 09/17/2010  . History of prostate cancer 12/14/2008  . PERIPHERAL NEUROPATHY 10/18/2007  . PSORIASIS 10/15/2007  . Essential hypertension 11/27/2006  . DEGENERATIVE JOINT DISEASE 11/27/2006  . NEPHROLITHIASIS, HX OF 11/27/2006   Daleen Bo PT, DPT 07/09/20 3:05 PM   [facility] [place], [place], [address] Phone: [phone]   Fax:  [phone]  Name: Jose Ware MRN: [id number] Date of Birth: November 28, 1934

## 2020-07-10 DIAGNOSIS — H35423 Microcystoid degeneration of retina, bilateral: Secondary | ICD-10-CM | POA: Diagnosis not present

## 2020-07-10 DIAGNOSIS — H524 Presbyopia: Secondary | ICD-10-CM | POA: Diagnosis not present

## 2020-07-10 DIAGNOSIS — H5213 Myopia, bilateral: Secondary | ICD-10-CM | POA: Diagnosis not present

## 2020-07-10 DIAGNOSIS — H52221 Regular astigmatism, right eye: Secondary | ICD-10-CM | POA: Diagnosis not present

## 2020-07-10 DIAGNOSIS — H353132 Nonexudative age-related macular degeneration, bilateral, intermediate dry stage: Secondary | ICD-10-CM | POA: Diagnosis not present

## 2020-07-10 DIAGNOSIS — H35372 Puckering of macula, left eye: Secondary | ICD-10-CM | POA: Diagnosis not present

## 2020-07-12 ENCOUNTER — Other Ambulatory Visit: Payer: Self-pay

## 2020-07-12 ENCOUNTER — Encounter: Payer: Self-pay | Admitting: Physical Therapy

## 2020-07-12 ENCOUNTER — Ambulatory Visit: Payer: Medicare Other | Admitting: Physical Therapy

## 2020-07-12 DIAGNOSIS — M545 Low back pain, unspecified: Secondary | ICD-10-CM | POA: Diagnosis not present

## 2020-07-12 DIAGNOSIS — M25562 Pain in left knee: Secondary | ICD-10-CM

## 2020-07-12 DIAGNOSIS — R262 Difficulty in walking, not elsewhere classified: Secondary | ICD-10-CM

## 2020-07-12 DIAGNOSIS — M6281 Muscle weakness (generalized): Secondary | ICD-10-CM

## 2020-07-12 DIAGNOSIS — G8929 Other chronic pain: Secondary | ICD-10-CM

## 2020-07-12 NOTE — Therapy (Signed)
[facility] [address] [place], [place], [address] Phone: [phone]   Fax:  [phone]  Physical Therapy Treatment  Patient Details  Name: Jose Ware MRN: [id number] Date of Birth: 1934-10-28 Referring Provider (PT): Dr. Garret Reddish   Encounter Date: 07/12/2020   PT End of Session - 07/12/20 1430    Visit Number 3    Number of Visits 17    Date for PT Re-Evaluation 09/01/20    Authorization Type UHC Medicare    PT Start Time 1345    PT Stop Time 1430    PT Time Calculation (min) 45 min    Activity Tolerance Patient tolerated treatment well    Behavior During Therapy [facility] for tasks assessed/performed           Past Medical History:  Diagnosis Date  . Adenocarcinoma of prostate [facility])    XRT & Radiation seed implantation in 2010  . Adenomatous colon polyp   . CAD (coronary artery disease)    a. 11/04/15:  Acute inferolateral STEMI: S/p emergent DES of a very large LCx with extensive thrombus. Significant residual disease in the proximal LAD and distal RCA. S/p staged PCI of LAD and RCA 8/29.  . Cataract   . CVA (cerebral infarction)    a. 06/4816: embolic CVA after heart cath   . CVA (cerebral infarction)    a. 07/6312: embolic CVA after heart cath   . DIVERTICULITIS, HX OF 11/27/2006       . DJD (degenerative joint disease)    wrist - R   . Hernia   . History of blood transfusion 1985   with colon surgery  . Hypertension   . Ischemic cardiomyopathy    a. EF 25-30% by LV gram on cath 11/04/15. Appeared out of proportion to infarct although infarct was large. EF improved by Echo and now 40-45%.  . Myocardial infarction ([place])   . NEPHROLITHIASIS, HX OF 11/27/2006        . Peripheral neuropathy   . Psoriasis   . Tenosynovitis     Past Surgical History:  Procedure Laterality Date  . APPENDECTOMY  1985  . BACK SURGERY  1980   minor disk surgery: instrumentation placed and removed in a second procedure  .  CARDIAC CATHETERIZATION N/A 11/04/2015   Procedure: Left Heart Cath and Coronary Angiography;  Surgeon: Peter M Martinique, MD;  Location: [place] CV LAB;  Service: Cardiovascular;  Laterality: N/A;  . CARDIAC CATHETERIZATION N/A 11/04/2015   Procedure: Coronary Stent Intervention;  Surgeon: Peter M Martinique, MD;  Location: [place] [facility];  Service: Cardiovascular;  Laterality: N/A;  . CARDIAC CATHETERIZATION N/A 11/06/2015   Procedure: Coronary Stent Intervention;  Surgeon: Leonie Man, MD;  Location: [place] [facility];  Service: Cardiovascular;  Laterality: N/A;  . CATARACT EXTRACTION, BILATERAL  2013  . CHOLECYSTECTOMY  1986  . COLON SURGERY  1980's   pt. had ileus, had colon surgery, requiring colostomy & then reversal & then dehisence of that wound & return to OR for repair & cholecystectomy    . COLONOSCOPY    . COLOSTOMY  1985   After colectomy for diverticulitis, pt. remarks during this surgery they "gave me the paddles two times  because of bleeding", pt. states it was unrelated to any anesthesia complication  . EYE SURGERY     /w IOL  . KNEE ARTHROSCOPY Left 2003  . KNEE SURGERY  1956   Left 1956, Right w/cartilage removed later  .  PARATHYROIDECTOMY N/A 05/12/2014   Procedure: PARATHYROIDECTOMY;  Surgeon: Armandina Gemma, MD;  Location: [place];  Service: General;  Laterality: N/A;  . REVERSAL OF COLOSTOMY  1987  . TONSILLECTOMY  1946  . WRIST SURGERY Left    Remote complicated w/infection    There were no vitals filed for this visit.   Subjective Assessment - 07/12/20 1430    Subjective Pt reports that he has had minimal to no back pain or knee pain since last session. He felt increased fatigue after last session.    Diagnostic tests X-ray: none for L knee, degenerative changes L1-5    Patient Stated Goals Pt states he would like to be able to stand and have a conversation with friends without pain.    Currently in Pain? No/denies    Pain Score 0-No pain                              [facility] Adult PT Treatment/Exercise - 07/12/20 0001      Transfers   Five time sit to stand comments  unable to perform without UE support      Ambulation/Gait   Ambulation Distance (Feet) 70 Feet        Gait Pattern Shuffle;Trunk flexed    Gait Comments VC and TC for heel strike, toe off, arm swing, increased step length CGA   no AD today     Posture/Postural Control   Posture/Postural Control Postural limitations    Postural Limitations Flexed trunk;Decreased lumbar lordosis      Exercises   Exercises Lumbar;Knee/Hip      Lumbar Exercises: Stretches   Pelvic Tilt Limitations 10x      Lumbar Exercises: Standing   Other Standing Lumbar Exercises TKE Red TB 2x10      Lumbar Exercises: Seated   Long Arc Quad on Chair --    LAQ on Chair Weights (lbs) --      Lumbar Exercises: Supine   Bridge 10 reps    Bridge Limitations 2 sets    Other Supine Lumbar Exercises LTR 3s 10x      Knee/Hip Exercises: Stretches   Passive Hamstring Stretch --    Gastroc Stretch Limitations 30s 3x  at wall     Knee/Hip Exercises: Standing   Functional Squat Limitations STS 10x    Other Standing Knee Exercises sidestepping 93ft x2      Knee/Hip Exercises: Supine   Quad Sets --    Straight Leg Raise with External Rotation 20 reps    Straight Leg Raise with External Rotation Limitations 2lbs      Manual Therapy   Manual Therapy Joint mobilization;Soft tissue mobilization    Joint Mobilization short sitting LAD grade III- Mulligan belt    Soft tissue mobilization quad, rec fem and VL                  PT Education - 07/12/20 1429    Education Details anatomy, exercise progression, DOMS expectations, muscle firing, AD usage, HEP    Person(s) Educated Patient    Methods Explanation;Demonstration;Tactile cues;Verbal cues;Handout    Comprehension Verbalized understanding;Returned demonstration;Verbal cues required;Tactile cues required             PT Short Term Goals - 07/03/20 1424      PT SHORT TERM GOAL #1   Title Pt will become independent with HEP in order to demonstrate synthesis of PT education.    Time 2  Period Weeks    Status New      PT SHORT TERM GOAL #2   Title Pt will be able to demonstrate >5 degs improvement in knee flexion and extension in order to demonstrate functional improvement in L LE function for self-care and house hold duties.    Time 4    Period Weeks    Status New      PT SHORT TERM GOAL #3   Title Pt will be able to perform 5XSTS in under 12  in order to demonstrate functional improvement above the cut off score for adults in that age group.    Time 4    Period Weeks    Status New             PT Long Term Goals - 07/03/20 1425      PT LONG TERM GOAL #1   Title Pt will demonstrate at least a 12.8 improvement in Oswestry Index in order to demonstrate a clinically significant change in LBP and function.    Time 6    Period Weeks    Status New      PT LONG TERM GOAL #2   Title Pt will have an at least >9 pt improvement in LEFS measure in order to demonstrate MCID improvement in daily function.    Time 6    Period Weeks    Status New      PT LONG TERM GOAL #3   Title Pt will be able to demonstrate  and report ability to stand for more than 20 mins order to demonstrate functional improvement in LE function, strength, balance, and mobility for safety with community activity.    Time 8    Period Weeks                 Plan - 07/12/20 1344    Clinical Impression Statement Pt with improved quadriceps activation and knee ROM following manual therapy and exercise at today's session. Pt was able to progress functional mobility and gait without use of AD today. Pt was able to continue progressing L quadriceps strengthening at today's session to long lever position without pain. Pt required CGA with gait training and required VC and TC for longer strides, arm swing, and upright trunk  but he does appear to be more confident with walking and has less apprehension. However, change of direction is still segemented and disjointed. Back pain has not  been been present in the last two treatment sessions, likely due to improved L LE function and gait quality. Plan to progress balance to include COD and STS at lower height. Pt would benefit from continued skilled therapy to maximize current function, prevent further functional decline, and promote safety.    Personal Factors and Comorbidities Age;Comorbidity 1;Comorbidity 2;Time since onset of injury/illness/exacerbation    Examination-Activity Limitations Lift;Stand;Carry;Squat;Stairs;Transfers    Examination-Participation Restrictions Church;Yard Work;Volunteer;Community Activity;Shop;Other    Stability/Clinical Decision Making Stable/Uncomplicated    Rehab Potential Fair    PT Frequency 2x / week    PT Duration 8 weeks    PT Treatment/Interventions ADLs/Self Care Home Management;Electrical Stimulation;Cryotherapy;Iontophoresis 4mg /ml Dexamethasone;Moist Heat;Ultrasound;Traction;Therapeutic exercise;Therapeutic activities;Functional mobility training;Stair training;Gait training;Balance training;Neuromuscular re-education;Patient/family education;Orthotic Fit/Training;Manual techniques;Compression bandaging;Passive range of motion;Dry needling;Energy conservation;Taping;Spinal Manipulations;Joint Manipulations;Scar mobilization    PT Next Visit Plan quad STM, gait training, obstacle course, tandem walking, LAQ 10lbs    PT Home Exercise Plan [id number]    Consulted and Agree with Plan of Care Patient  Patient will benefit from skilled therapeutic intervention in order to improve the following deficits and impairments:  Abnormal gait,Pain,Improper body mechanics,Decreased mobility,Increased muscle spasms,Impaired flexibility,Difficulty walking,Decreased balance,Decreased activity tolerance,Decreased endurance,Decreased range of  motion,Decreased strength,Hypomobility  Visit Diagnosis: Chronic pain of left knee  Pain, lumbar region  Difficulty walking  Muscle weakness (generalized)     Problem List Patient Active Problem List   Diagnosis Date Noted  . OSA (obstructive sleep apnea) 05/08/2020  . Onychomycosis 10/28/2017  . Spondylosis 06/25/2017  . Post herpetic neuralgia 06/20/2017  . Spinal stenosis of lumbar region at multiple levels 04/30/2017  . Drug reaction 02/05/2017  . Senile purpura ([place]) 02/05/2017  . Dyslipidemia 11/16/2015  . History of ventricular tachycardia 11/08/2015  . CAD S/P PCI   . Ischemic cardiomyopathy   . History of embolic stroke   . History of adenomatous polyp of colon 01/18/2015  . Primary hyperparathyroidism ([place]) 12/28/2013  . History of stomach cancer 12/01/2013  . Fatigue 12/01/2013  . Lower back pain 03/24/2013  . Bradycardia 09/23/2012  . Bruising 09/23/2012  . B12 deficiency 09/17/2010  . History of prostate cancer 12/14/2008  . PERIPHERAL NEUROPATHY 10/18/2007  . PSORIASIS 10/15/2007  . Essential hypertension 11/27/2006  . DEGENERATIVE JOINT DISEASE 11/27/2006  . NEPHROLITHIASIS, HX OF 11/27/2006    Daleen Bo PT, DPT 07/12/20 4:50 PM   [facility] [address] [place], [place], [address] Phone: [phone]   Fax:  [phone]  Name: Jose Ware MRN: [id number] Date of Birth: 09-12-1934

## 2020-07-16 ENCOUNTER — Ambulatory Visit: Payer: Medicare Other | Admitting: Physical Therapy

## 2020-07-16 ENCOUNTER — Encounter: Payer: Self-pay | Admitting: Physical Therapy

## 2020-07-16 ENCOUNTER — Other Ambulatory Visit: Payer: Self-pay

## 2020-07-16 DIAGNOSIS — R262 Difficulty in walking, not elsewhere classified: Secondary | ICD-10-CM

## 2020-07-16 DIAGNOSIS — M25562 Pain in left knee: Secondary | ICD-10-CM | POA: Diagnosis not present

## 2020-07-16 DIAGNOSIS — M6281 Muscle weakness (generalized): Secondary | ICD-10-CM | POA: Diagnosis not present

## 2020-07-16 DIAGNOSIS — M545 Low back pain, unspecified: Secondary | ICD-10-CM | POA: Diagnosis not present

## 2020-07-16 DIAGNOSIS — G8929 Other chronic pain: Secondary | ICD-10-CM | POA: Diagnosis not present

## 2020-07-16 NOTE — Therapy (Signed)
[facility] [address] [place], [place], [address] Phone: [phone]   Fax:  [phone]  Physical Therapy Treatment  Patient Details  Name: Jose Ware MRN: [id number] Date of Birth: [birth year] Referring Provider (PT): Dr. Garret Reddish   Encounter Date: 07/16/2020   PT End of Session - 07/16/20 1430    Visit Number 4    Number of Visits 17    Date for PT Re-Evaluation 09/01/20    Authorization Type [employer] Medicare    PT Start Time 1345    PT Stop Time 1430    PT Time Calculation (min) 45 min    Activity Tolerance Patient tolerated treatment well    Behavior During Therapy [facility] for tasks assessed/performed           Past Medical History:  Diagnosis Date  . Adenocarcinoma of prostate [facility])    XRT & Radiation seed implantation in 2010  . Adenomatous colon polyp   . CAD (coronary artery disease)    a. 11/04/15:  Acute inferolateral STEMI: S/p emergent DES of a very large LCx with extensive thrombus. Significant residual disease in the proximal LAD and distal RCA. S/p staged PCI of LAD and RCA 8/29.  . Cataract   . CVA (cerebral infarction)    a. 10/9209: embolic CVA after heart cath   . CVA (cerebral infarction)    a. 11/4172: embolic CVA after heart cath   . DIVERTICULITIS, HX OF 11/27/2006       . DJD (degenerative joint disease)    wrist - R   . Hernia   . History of blood transfusion 1985   with colon surgery  . Hypertension   . Ischemic cardiomyopathy    a. EF 25-30% by LV gram on cath 11/04/15. Appeared out of proportion to infarct although infarct was large. EF improved by Echo and now 40-45%.  . Myocardial infarction ([place])   . NEPHROLITHIASIS, HX OF 11/27/2006        . Peripheral neuropathy   . Psoriasis   . Tenosynovitis     Past Surgical History:  Procedure Laterality Date  . APPENDECTOMY  1985  . BACK SURGERY  1980   minor disk surgery: instrumentation placed and removed in a second procedure  .  CARDIAC CATHETERIZATION N/A 11/04/2015   Procedure: Left Heart Cath and Coronary Angiography;  Surgeon: Peter M Martinique, MD;  Location: [place] CV LAB;  Service: Cardiovascular;  Laterality: N/A;  . CARDIAC CATHETERIZATION N/A 11/04/2015   Procedure: Coronary Stent Intervention;  Surgeon: Peter M Martinique, MD;  Location: [place] CV LAB;  Service: Cardiovascular;  Laterality: N/A;  . CARDIAC CATHETERIZATION N/A 11/06/2015   Procedure: Coronary Stent Intervention;  Surgeon: Leonie Man, MD;  Location: [place] CV LAB;  Service: Cardiovascular;  Laterality: N/A;  . CATARACT EXTRACTION, BILATERAL  2013  . CHOLECYSTECTOMY  1986  . COLON SURGERY  1980's   pt. had ileus, had colon surgery, requiring colostomy & then reversal & then dehisence of that wound & return to OR for repair & cholecystectomy    . COLONOSCOPY    . COLOSTOMY  1985   After colectomy for diverticulitis, pt. remarks during this surgery they "gave me the paddles two times  because of bleeding", pt. states it was unrelated to any anesthesia complication  . EYE SURGERY     /w IOL  . KNEE ARTHROSCOPY Left 2003  . KNEE SURGERY  1956   Left 1956, Right w/cartilage removed later  .  PARATHYROIDECTOMY N/A 05/12/2014   Procedure: PARATHYROIDECTOMY;  Surgeon: Armandina Gemma, MD;  Location: [place];  Service: General;  Laterality: N/A;  . REVERSAL OF COLOSTOMY  1987  . TONSILLECTOMY  1946  . WRIST SURGERY Left    Remote complicated w/infection    There were no vitals filed for this visit.   Subjective Assessment - 07/16/20 1352    Subjective Pt states he was not sore after last session. He was able to walk .25 miles without the cane. He had some calf soreness but it did not last more than 1-2 days. Pt states he only had back pain after standing for a while at church.    Diagnostic tests X-ray: none for L knee, degenerative changes L1-5    Patient Stated Goals Pt states he would like to be able to stand and have a conversation with  friends without pain.    Currently in Pain? No/denies    Pain Score 0-No pain                             [facility] Adult PT Treatment/Exercise - 07/16/20 0001      Transfers   Five time sit to stand comments  unable to perform without UE support      Ambulation/Gait           Gait Pattern Shuffle;Trunk flexed    Gait Comments VC and TC for heel strike, toe off, arm swing, increased step length CGA      Posture/Postural Control   Posture/Postural Control Postural limitations    Postural Limitations Flexed trunk;Decreased lumbar lordosis      Exercises   Exercises Lumbar;Knee/Hip      Lumbar Exercises: Standing   Other Standing Lumbar Exercises TKE green TB 2x10    Other Standing Lumbar Exercises tandem walking 80ft x2      Lumbar Exercises: Seated   Long Arc Quad on Chair 10 reps;2 sets    LAQ on Chair Weights (lbs) 10lbs      Lumbar Exercises: Supine   Bridge 10 reps    Bridge Limitations 2 sets GTB at knees    Other Supine Lumbar Exercises --      Knee/Hip Exercises: Stretches   Press photographer Limitations --    Other Knee/Hip Stretches heel drop 2x10 at step      Knee/Hip Exercises: Standing   Functional Squat Limitations STS 10x    Other Standing Knee Exercises sidestepping 9ft x2; YTB around knees    Other Standing Knee Exercises heel toe rocking 15x      Knee/Hip Exercises: Supine   Straight Leg Raise with External Rotation 20 reps    Straight Leg Raise with External Rotation Limitations 3lbs      Manual Therapy   Manual Therapy Joint mobilization;Soft tissue mobilization    Joint Mobilization short sitting LAD grade III- Mulligan belt    Soft tissue mobilization --                  PT Education - 07/16/20 1429    Education Details anatomy, exercise progression, DOMS expectations, muscle firing, safety with gym exercise, focus on L LE strengthening/balance, HEP    Person(s) Educated Patient    Methods  Explanation;Demonstration;Tactile cues;Verbal cues    Comprehension Verbalized understanding;Returned demonstration;Verbal cues required;Tactile cues required            PT Short Term Goals - 07/03/20 1424      PT  SHORT TERM GOAL #1   Title Pt will become independent with HEP in order to demonstrate synthesis of PT education.    Time 2    Period Weeks    Status New      PT SHORT TERM GOAL #2   Title Pt will be able to demonstrate >5 degs improvement in knee flexion and extension in order to demonstrate functional improvement in L LE function for self-care and house hold duties.    Time 4    Period Weeks    Status New      PT SHORT TERM GOAL #3   Title Pt will be able to perform 5XSTS in under 12  in order to demonstrate functional improvement above the cut off score for adults in that age group.    Time 4    Period Weeks    Status New             PT Long Term Goals - 07/03/20 1425      PT LONG TERM GOAL #1   Title Pt will demonstrate at least a 12.8 improvement in Oswestry Index in order to demonstrate a clinically significant change in LBP and function.    Time 6    Period Weeks    Status New      PT LONG TERM GOAL #2   Title Pt will have an at least >9 pt improvement in LEFS measure in order to demonstrate MCID improvement in daily function.    Time 6    Period Weeks    Status New      PT LONG TERM GOAL #3   Title Pt will be able to demonstrate  and report ability to stand for more than 20 mins order to demonstrate functional improvement in LE function, strength, balance, and mobility for safety with community activity.    Time 8    Period Weeks                 Plan - 07/16/20 1430    Clinical Impression Statement Pt was able to progress OKC L LE strengthening with increased intensity of loading without increased pain. Pt had difficulty today with tandem walking and had LOB that required minA for righting reaction. Pt had anterior LOB vs expected ML. Pt  was able to tolerate more at today's session suggesting improved endurance,but did show signs of fatigue when challenged with dynamic balance activity. Trial COD and STS at next session. Back pain has been minimal to none since last session. Pt would benefit from continued skilled therapy to maximize current function, prevent further functional decline, and promote safety.    Personal Factors and Comorbidities Age;Comorbidity 1;Comorbidity 2;Time since onset of injury/illness/exacerbation    Examination-Activity Limitations Lift;Stand;Carry;Squat;Stairs;Transfers    Examination-Participation Restrictions Church;Yard Work;Volunteer;Community Activity;Shop;Other    Stability/Clinical Decision Making Stable/Uncomplicated    Rehab Potential Fair    PT Frequency 2x / week    PT Duration 8 weeks    PT Treatment/Interventions ADLs/Self Care Home Management;Electrical Stimulation;Cryotherapy;Iontophoresis 4mg /ml Dexamethasone;Moist Heat;Ultrasound;Traction;Therapeutic exercise;Therapeutic activities;Functional mobility training;Stair training;Gait training;Balance training;Neuromuscular re-education;Patient/family education;Orthotic Fit/Training;Manual techniques;Compression bandaging;Passive range of motion;Dry needling;Energy conservation;Taping;Spinal Manipulations;Joint Manipulations;Scar mobilization    PT Next Visit Plan quad STM, gait training, obstacle course, tandem walking, LAQ 10lbs with hold    PT Home Exercise Plan [id number]    Consulted and Agree with Plan of Care Patient           Patient will benefit from skilled therapeutic intervention in order to improve the  following deficits and impairments:  Abnormal gait,Pain,Improper body mechanics,Decreased mobility,Increased muscle spasms,Impaired flexibility,Difficulty walking,Decreased balance,Decreased activity tolerance,Decreased endurance,Decreased range of motion,Decreased strength,Hypomobility  Visit Diagnosis: Chronic pain of left  knee  Pain, lumbar region  Difficulty walking  Muscle weakness (generalized)     Problem List Patient Active Problem List   Diagnosis Date Noted  . OSA (obstructive sleep apnea) 05/08/2020  . Onychomycosis 10/28/2017  . Spondylosis 06/25/2017  . Post herpetic neuralgia 06/20/2017  . Spinal stenosis of lumbar region at multiple levels 04/30/2017  . Drug reaction 02/05/2017  . Senile purpura ([place]) 02/05/2017  . Dyslipidemia 11/16/2015  . History of ventricular tachycardia 11/08/2015  . CAD S/P PCI   . Ischemic cardiomyopathy   . History of embolic stroke   . History of adenomatous polyp of colon 01/18/2015  . Primary hyperparathyroidism ([place]) 12/28/2013  . History of stomach cancer 12/01/2013  . Fatigue 12/01/2013  . Lower back pain 03/24/2013  . Bradycardia 09/23/2012  . Bruising 09/23/2012  . B12 deficiency 09/17/2010  . History of prostate cancer 12/14/2008  . PERIPHERAL NEUROPATHY 10/18/2007  . PSORIASIS 10/15/2007  . Essential hypertension 11/27/2006  . DEGENERATIVE JOINT DISEASE 11/27/2006  . NEPHROLITHIASIS, HX OF 11/27/2006    Daleen Bo PT, DPT 07/16/20 2:34 PM   [facility] [address] [place], [place], [address] Phone: [phone]   Fax:  [phone]  Name: WINSTON MISNER MRN: [id number] Date of Birth: 10/26/1934

## 2020-07-17 ENCOUNTER — Telehealth: Payer: Self-pay | Admitting: Adult Health

## 2020-07-17 NOTE — Telephone Encounter (Signed)
Called and spoke with [employer].  [employer] stated Patient reached out to [employer] this morning and Patient was updated.  Patient was concerned that he would not have his cpap before 08/09/20 OV with Tammy,NP.  Patient was concerned with cancelling OV.  Patient was advised by [employer] that they have no time frame on cpap delivery, but he was assigned with someone.  Patient was told that someone will call 1 week before OV scheduled to up Patient on cpap, so Patient can reschedule OV, if needed. Nothing further at this time.

## 2020-07-19 ENCOUNTER — Other Ambulatory Visit: Payer: Self-pay

## 2020-07-19 ENCOUNTER — Ambulatory Visit: Payer: Medicare Other | Admitting: Physical Therapy

## 2020-07-19 ENCOUNTER — Encounter: Payer: Self-pay | Admitting: Physical Therapy

## 2020-07-19 DIAGNOSIS — G8929 Other chronic pain: Secondary | ICD-10-CM | POA: Diagnosis not present

## 2020-07-19 DIAGNOSIS — R262 Difficulty in walking, not elsewhere classified: Secondary | ICD-10-CM | POA: Diagnosis not present

## 2020-07-19 DIAGNOSIS — M25562 Pain in left knee: Secondary | ICD-10-CM | POA: Diagnosis not present

## 2020-07-19 DIAGNOSIS — M545 Low back pain, unspecified: Secondary | ICD-10-CM | POA: Diagnosis not present

## 2020-07-19 DIAGNOSIS — M6281 Muscle weakness (generalized): Secondary | ICD-10-CM | POA: Diagnosis not present

## 2020-07-19 NOTE — Therapy (Signed)
[facility] [address] [place], [place], [address] Phone: [phone]   Fax:  [phone]  Physical Therapy Treatment  Patient Details  Name: Jose Ware MRN: [id number] Date of Birth: [birth year] Referring Provider (PT): Dr. Garret Reddish   Encounter Date: 07/19/2020   PT End of Session - 07/19/20 1352    Visit Number 5    Number of Visits 17    Date for PT Re-Evaluation 09/01/20    Authorization Type UHC Medicare    PT Start Time 1335    PT Stop Time 1420    PT Time Calculation (min) 45 min    Activity Tolerance Patient tolerated treatment well    Behavior During Therapy [facility] for tasks assessed/performed           Past Medical History:  Diagnosis Date  . Adenocarcinoma of prostate [facility])    XRT & Radiation seed implantation in 2010  . Adenomatous colon polyp   . CAD (coronary artery disease)    a. 11/04/15:  Acute inferolateral STEMI: S/p emergent DES of a very large LCx with extensive thrombus. Significant residual disease in the proximal LAD and distal RCA. S/p staged PCI of LAD and RCA 8/29.  . Cataract   . CVA (cerebral infarction)    a. 06/4032: embolic CVA after heart cath   . CVA (cerebral infarction)    a. 09/4257: embolic CVA after heart cath   . DIVERTICULITIS, HX OF 11/27/2006       . DJD (degenerative joint disease)    wrist - R   . Hernia   . History of blood transfusion 1985   with colon surgery  . Hypertension   . Ischemic cardiomyopathy    a. EF 25-30% by LV gram on cath 11/04/15. Appeared out of proportion to infarct although infarct was large. EF improved by Echo and now 40-45%.  . Myocardial infarction ([place])   . NEPHROLITHIASIS, HX OF 11/27/2006        . Peripheral neuropathy   . Psoriasis   . Tenosynovitis     Past Surgical History:  Procedure Laterality Date  . APPENDECTOMY  1985  . BACK SURGERY  1980   minor disk surgery: instrumentation placed and removed in a second procedure  .  CARDIAC CATHETERIZATION N/A 11/04/2015   Procedure: Left Heart Cath and Coronary Angiography;  Surgeon: Peter M Martinique, MD;  Location: [place] CV LAB;  Service: Cardiovascular;  Laterality: N/A;  . CARDIAC CATHETERIZATION N/A 11/04/2015   Procedure: Coronary Stent Intervention;  Surgeon: Peter M Martinique, MD;  Location: [place] CV LAB;  Service: Cardiovascular;  Laterality: N/A;  . CARDIAC CATHETERIZATION N/A 11/06/2015   Procedure: Coronary Stent Intervention;  Surgeon: Leonie Man, MD;  Location: [place] CV LAB;  Service: Cardiovascular;  Laterality: N/A;  . CATARACT EXTRACTION, BILATERAL  2013  . CHOLECYSTECTOMY  1986  . COLON SURGERY  1980's   pt. had ileus, had colon surgery, requiring colostomy & then reversal & then dehisence of that wound & return to OR for repair & cholecystectomy    . COLONOSCOPY    . COLOSTOMY  1985   After colectomy for diverticulitis, pt. remarks during this surgery they "gave me the paddles two times  because of bleeding", pt. states it was unrelated to any anesthesia complication  . EYE SURGERY     /w IOL  . KNEE ARTHROSCOPY Left 2003  . KNEE SURGERY  1956   Left 1956, Right w/cartilage removed later  .  PARATHYROIDECTOMY N/A 05/12/2014   Procedure: PARATHYROIDECTOMY;  Surgeon: Armandina Gemma, MD;  Location: [facility];  Service: General;  Laterality: N/A;  . REVERSAL OF COLOSTOMY  1987  . TONSILLECTOMY  1946  . WRIST SURGERY Left    Remote complicated w/infection    There were no vitals filed for this visit.   Subjective Assessment - 07/19/20 1338    Subjective Pt states he feels a little off balance today so he is walking with the stick again. He states that the knee was painful on Tuesday after the session after standing for too long at the grocery store. Today, it has improved. Pt states he feels wobbly when going from siting to standing and especially at night/later in the day as he become more fatigued.    Diagnostic tests X-ray: none for L knee,  degenerative changes L1-5    Patient Stated Goals Pt states he would like to be able to stand and have a conversation with friends without pain.    Currently in Pain? No/denies    Pain Score 0-No pain                             [facility] Adult PT Treatment/Exercise - 07/19/20 0001      Transfers   Five time sit to stand comments  unable to perform without UE support      Ambulation/Gait   Gait Comments Pt presents with walking stick today due to increased feeling of instability.     Posture/Postural Control   Posture/Postural Control Postural limitations    Postural Limitations Flexed trunk;Decreased lumbar lordosis      Exercises   Exercises Lumbar;Knee/Hip      Lumbar Exercises: Standing             Lumbar Exercises: Seated   Long Arc Quad on Chair 10 reps;2 sets    LAQ on Chair Weights (lbs) 10lbs   3s hold                  Knee/Hip Exercises: Stretches   Passive Hamstring Stretch 30 seconds;3 reps    Other Knee/Hip Stretches heel drop 2x10 at step      Knee/Hip Exercises: Standing   Functional Squat Limitations STS 2x10 22" table weight    Other Standing Knee Exercises sidestepping 58ft x2; YTB around knees; HS curl 5lbs 2x10    Other Standing Knee Exercises 4 cone box stepping 10x      Knee/Hip Exercises: Supine   Straight Leg Raise with External Rotation 20 reps    Straight Leg Raise with External Rotation Limitations 3lbs      Manual Therapy   Manual Therapy Joint mobilization;Soft tissue mobilization    Joint Mobilization short sitting LAD grade III- Mulligan belt                  PT Education - 07/19/20 1351    Education Details anatomy, exercise progression, DOMS expectations, safety with gym exercise, focus on L LE strengthening/balance, HEP    Person(s) Educated Patient    Methods Explanation;Demonstration;Tactile cues;Verbal cues    Comprehension Verbalized understanding;Returned demonstration;Verbal cues required;Tactile  cues required            PT Short Term Goals - 07/03/20 1424      PT SHORT TERM GOAL #1   Title Pt will become independent with HEP in order to demonstrate synthesis of PT education.    Time 2  Period Weeks    Status New      PT SHORT TERM GOAL #2   Title Pt will be able to demonstrate >5 degs improvement in knee flexion and extension in order to demonstrate functional improvement in L LE function for self-care and house hold duties.    Time 4    Period Weeks    Status New      PT SHORT TERM GOAL #3   Title Pt will be able to perform 5XSTS in under 12  in order to demonstrate functional improvement above the cut off score for adults in that age group.    Time 4    Period Weeks    Status New             PT Long Term Goals - 07/03/20 1425      PT LONG TERM GOAL #1   Title Pt will demonstrate at least a 12.8 improvement in Oswestry Index in order to demonstrate a clinically significant change in LBP and function.    Time 6    Period Weeks    Status New      PT LONG TERM GOAL #2   Title Pt will have an at least >9 pt improvement in LEFS measure in order to demonstrate MCID improvement in daily function.    Time 6    Period Weeks    Status New      PT LONG TERM GOAL #3   Title Pt will be able to demonstrate  and report ability to stand for more than 20 mins order to demonstrate functional improvement in LE function, strength, balance, and mobility for safety with community activity.    Time 8    Period Weeks                 Plan - 07/19/20 1352    Clinical Impression Statement Pt was able to progress to CKC exercise without increased pain. STS required raised table height and pt required cuing for slow eccentric lower  Pt was able to perform COD exercise but has difficulty with L LE WB turns/pivots. Pt reports and appears apprehension with L LE accepting weight during lateral and backwards movements. With pt report of only getting 5 hours of sleep a night and  poor rest quality due to sleep apnea, pts decreased balance likely have a fatigue component. Pt awaiting CPAP to address issue. Plan to progress L LE strengthening as able to improve gait quality and strength with ADL. Pt would benefit from continued skilled therapy to maximize current function, prevent further functional decline, and promote safety.    Personal Factors and Comorbidities Age;Comorbidity 1;Comorbidity 2;Time since onset of injury/illness/exacerbation    Examination-Activity Limitations Lift;Stand;Carry;Squat;Stairs;Transfers    Examination-Participation Restrictions Church;Yard Work;Volunteer;Community Activity;Shop;Other    Stability/Clinical Decision Making Stable/Uncomplicated    Rehab Potential Fair    PT Frequency 2x / week    PT Duration 8 weeks    PT Treatment/Interventions ADLs/Self Care Home Management;Electrical Stimulation;Cryotherapy;Iontophoresis 4mg /ml Dexamethasone;Moist Heat;Ultrasound;Traction;Therapeutic exercise;Therapeutic activities;Functional mobility training;Stair training;Gait training;Balance training;Neuromuscular re-education;Patient/family education;Orthotic Fit/Training;Manual techniques;Compression bandaging;Passive range of motion;Dry needling;Energy conservation;Taping;Spinal Manipulations;Joint Manipulations;Scar mobilization    PT Next Visit Plan quad STM, gait training, obstacle course, tandem walking, LAQ 10lbs with hold    PT Home Exercise Plan [id number]    Consulted and Agree with Plan of Care Patient           Patient will benefit from skilled therapeutic intervention in order to improve the following deficits and impairments:  Abnormal gait,Pain,Improper body mechanics,Decreased mobility,Increased muscle spasms,Impaired flexibility,Difficulty walking,Decreased balance,Decreased activity tolerance,Decreased endurance,Decreased range of motion,Decreased strength,Hypomobility  Visit Diagnosis: Chronic pain of left knee  Pain, lumbar  region  Difficulty walking  Muscle weakness (generalized)     Problem List Patient Active Problem List   Diagnosis Date Noted  . OSA (obstructive sleep apnea) 05/08/2020  . Onychomycosis 10/28/2017  . Spondylosis 06/25/2017  . Post herpetic neuralgia 06/20/2017  . Spinal stenosis of lumbar region at multiple levels 04/30/2017  . Drug reaction 02/05/2017  . Senile purpura ([place]) 02/05/2017  . Dyslipidemia 11/16/2015  . History of ventricular tachycardia 11/08/2015  . CAD S/P PCI   . Ischemic cardiomyopathy   . History of embolic stroke   . History of adenomatous polyp of colon 01/18/2015  . Primary hyperparathyroidism ([place]) 12/28/2013  . History of stomach cancer 12/01/2013  . Fatigue 12/01/2013  . Lower back pain 03/24/2013  . Bradycardia 09/23/2012  . Bruising 09/23/2012  . B12 deficiency 09/17/2010  . History of prostate cancer 12/14/2008  . PERIPHERAL NEUROPATHY 10/18/2007  . PSORIASIS 10/15/2007  . Essential hypertension 11/27/2006  . DEGENERATIVE JOINT DISEASE 11/27/2006  . NEPHROLITHIASIS, HX OF 11/27/2006    Daleen Bo 07/19/2020, 2:24 PM  [facility] [place], [place], [address] Phone: [phone]   Fax:  [phone]  Name: Jose Ware MRN: [id number] Date of Birth: 07-Oct-1934

## 2020-07-21 ENCOUNTER — Other Ambulatory Visit: Payer: Self-pay | Admitting: Cardiology

## 2020-07-23 ENCOUNTER — Encounter: Payer: Self-pay | Admitting: Physical Therapy

## 2020-07-23 ENCOUNTER — Ambulatory Visit: Payer: Medicare Other | Admitting: Physical Therapy

## 2020-07-23 ENCOUNTER — Other Ambulatory Visit: Payer: Self-pay

## 2020-07-23 DIAGNOSIS — M545 Low back pain, unspecified: Secondary | ICD-10-CM

## 2020-07-23 DIAGNOSIS — R262 Difficulty in walking, not elsewhere classified: Secondary | ICD-10-CM

## 2020-07-23 DIAGNOSIS — G8929 Other chronic pain: Secondary | ICD-10-CM

## 2020-07-23 DIAGNOSIS — M25562 Pain in left knee: Secondary | ICD-10-CM | POA: Diagnosis not present

## 2020-07-23 DIAGNOSIS — M6281 Muscle weakness (generalized): Secondary | ICD-10-CM | POA: Diagnosis not present

## 2020-07-23 NOTE — Therapy (Signed)
[facility] [address] [place], [place], [address] Phone: [phone]   Fax:  [phone]  Physical Therapy Treatment  Patient Details  Name: Jose Ware MRN: [id number] Date of Birth: [birth year] Referring Provider (PT): Dr. Garret Reddish   Encounter Date: 07/23/2020    Past Medical History:  Diagnosis Date  . Adenocarcinoma of prostate [facility])    XRT & Radiation seed implantation in 2010  . Adenomatous colon polyp   . CAD (coronary artery disease)    a. 11/04/15:  Acute inferolateral STEMI: S/p emergent DES of a very large LCx with extensive thrombus. Significant residual disease in the proximal LAD and distal RCA. S/p staged PCI of LAD and RCA 8/29.  . Cataract   . CVA (cerebral infarction)    a. 10/1015: embolic CVA after heart cath   . CVA (cerebral infarction)    a. 07/1023: embolic CVA after heart cath   . DIVERTICULITIS, HX OF 11/27/2006       . DJD (degenerative joint disease)    wrist - R   . Hernia   . History of blood transfusion 1985   with colon surgery  . Hypertension   . Ischemic cardiomyopathy    a. EF 25-30% by LV gram on cath 11/04/15. Appeared out of proportion to infarct although infarct was large. EF improved by Echo and now 40-45%.  . Myocardial infarction ([place])   . NEPHROLITHIASIS, HX OF 11/27/2006        . Peripheral neuropathy   . Psoriasis   . Tenosynovitis     Past Surgical History:  Procedure Laterality Date  . APPENDECTOMY  1985  . BACK SURGERY  1980   minor disk surgery: instrumentation placed and removed in a second procedure  . CARDIAC CATHETERIZATION N/A 11/04/2015   Procedure: Left Heart Cath and Coronary Angiography;  Surgeon: Peter M Martinique, MD;  Location: [facility];  Service: Cardiovascular;  Laterality: N/A;  . CARDIAC CATHETERIZATION N/A 11/04/2015   Procedure: Coronary Stent Intervention;  Surgeon: Peter M Martinique, MD;  Location: [facility];  Service:  Cardiovascular;  Laterality: N/A;  . CARDIAC CATHETERIZATION N/A 11/06/2015   Procedure: Coronary Stent Intervention;  Surgeon: Leonie Man, MD;  Location: [facility];  Service: Cardiovascular;  Laterality: N/A;  . CATARACT EXTRACTION, BILATERAL  2013  . CHOLECYSTECTOMY  1986  . COLON SURGERY  1980's   pt. had ileus, had colon surgery, requiring colostomy & then reversal & then dehisence of that wound & return to OR for repair & cholecystectomy    . COLONOSCOPY    . COLOSTOMY  1985   After colectomy for diverticulitis, pt. remarks during this surgery they "gave me the paddles two times  because of bleeding", pt. states it was unrelated to any anesthesia complication  . EYE SURGERY     /w IOL  . KNEE ARTHROSCOPY Left 2003  . KNEE SURGERY  1956   Left 1956, Right w/cartilage removed later  . PARATHYROIDECTOMY N/A 05/12/2014   Procedure: PARATHYROIDECTOMY;  Surgeon: Armandina Gemma, MD;  Location: [facility];  Service: General;  Laterality: N/A;  . REVERSAL OF COLOSTOMY  1987  . TONSILLECTOMY  1946  . WRIST SURGERY Left    Remote complicated w/infection    There were no vitals filed for this visit.   Subjective Assessment - 07/23/20 1350    Subjective Pt states that on Sunday, he fell due to the L knee "snapping." He states that he  did not hit his head or sustain other injuries. Pt denies feeling pain or swelling after falling. Pt was walking in his grassy backyard when it happened. Pt states that today has lower energy, potentially due to sleep and lack of recovery. Legs feel numb and heavy.    Diagnostic tests X-ray: none for L knee, degenerative changes L1-5    Patient Stated Goals Pt states he would like to be able to stand and have a conversation with friends without pain.    Currently in Pain? No/denies    Pain Score 0-No pain                             [facility] Adult PT Treatment/Exercise - 07/23/20 0001      Transfers   Five time sit to stand comments  --       Ambulation/Gait   Gait Comments Shuffling with walking stick     Posture/Postural Control   Posture/Postural Control Postural limitations    Postural Limitations Flexed trunk;Decreased lumbar lordosis      Exercises   Exercises Lumbar;Knee/Hip      Lumbar Exercises: Standing   Other Standing Lumbar Exercises --      Lumbar Exercises: Seated   Long Arc Quad on Chair 10 reps;2 sets    LAQ on Chair Weights (lbs) 10lbs   3s hold     Lumbar Exercises: Supine   Bridge --    Bridge Limitations --      Knee/Hip Exercises: Stretches   Passive Hamstring Stretch 30 seconds;3 reps    Other Knee/Hip Stretches heel drop 2x10 at step      Knee/Hip Exercises: Standing   Functional Squat Limitations --    Other Standing Knee Exercises sidestepping 35ft x2; HS curl 10lbs 2x10    Other Standing Knee Exercises standing HS curl 2x10; heel raise at step 2x10      Knee/Hip Exercises: Seated   Long Arc Quad 2 sets;10 reps    Long Arc Quad Weight 10 lbs.      Knee/Hip Exercises: Supine   Straight Leg Raise with External Rotation 15 reps    Straight Leg Raise with External Rotation Limitations 3lbs      Manual Therapy   Manual Therapy Joint mobilization;Soft tissue mobilization    Joint Mobilization Supine knee flexion and ext mob grade II, L quad STM                   PT Short Term Goals - 07/03/20 1424      PT SHORT TERM GOAL #1   Title Pt will become independent with HEP in order to demonstrate synthesis of PT education.    Time 2    Period Weeks    Status New      PT SHORT TERM GOAL #2   Title Pt will be able to demonstrate >5 degs improvement in knee flexion and extension in order to demonstrate functional improvement in L LE function for self-care and house hold duties.    Time 4    Period Weeks    Status New      PT SHORT TERM GOAL #3   Title Pt will be able to perform 5XSTS in under 12  in order to demonstrate functional improvement above the cut off score for  adults in that age group.    Time 4    Period Weeks    Status New  PT Long Term Goals - 07/03/20 1425      PT LONG TERM GOAL #1   Title Pt will demonstrate at least a 12.8 improvement in Oswestry Index in order to demonstrate a clinically significant change in LBP and function.    Time 6    Period Weeks    Status New      PT LONG TERM GOAL #2   Title Pt will have an at least >9 pt improvement in LEFS measure in order to demonstrate MCID improvement in daily function.    Time 6    Period Weeks    Status New      PT LONG TERM GOAL #3   Title Pt will be able to demonstrate  and report ability to stand for more than 20 mins order to demonstrate functional improvement in LE function, strength, balance, and mobility for safety with community activity.    Time 8    Period Weeks                 Plan - 07/23/20 1414    Clinical Impression Statement Due to pt report of falling and giving way in the knee, pt L LE was assessed for structural damage. Clinical testing does not suggest internal L knee derangement at this time, though pt does have increased laxity on L compared to R. Pt exercise omitted dynamic stability and balance exercise due to increased fatigue and hesistance towards balance related activity. Pt may be experience decreased recovery due to history of sleep apnea and lack of management due to issues with getting CPAP delivered. Pt advised to aid recovery through hydration, nutrition, improving sleep quality, and active mobility. Also given reinforcement about continued safe physical activity. Pt gave verbal understanding. Pt did not appear to be in distress or show adverse effects from exercise. Plan to progress strength and balance exercise as able. Pt would benefit from continued skilled therapy in order to reach goals and maximize functional L LE strength and ROM for prevention of further functional decline.    Personal Factors and Comorbidities  Age;Comorbidity 1;Comorbidity 2;Time since onset of injury/illness/exacerbation    Examination-Activity Limitations Lift;Stand;Carry;Squat;Stairs;Transfers    Examination-Participation Restrictions Church;Yard Work;Volunteer;Community Activity;Shop;Other    Stability/Clinical Decision Making Stable/Uncomplicated    Rehab Potential Fair    PT Frequency 2x / week    PT Duration 8 weeks    PT Treatment/Interventions ADLs/Self Care Home Management;Electrical Stimulation;Cryotherapy;Iontophoresis 4mg /ml Dexamethasone;Moist Heat;Ultrasound;Traction;Therapeutic exercise;Therapeutic activities;Functional mobility training;Stair training;Gait training;Balance training;Neuromuscular re-education;Patient/family education;Orthotic Fit/Training;Manual techniques;Compression bandaging;Passive range of motion;Dry needling;Energy conservation;Taping;Spinal Manipulations;Joint Manipulations;Scar mobilization    PT Next Visit Plan quad STM, gait training, obstacle course, tandem walking, LAQ 10lbs with hold    PT Home Exercise Plan [email or web address]    Consulted and Agree with Plan of Care Patient           Patient will benefit from skilled therapeutic intervention in order to improve the following deficits and impairments:  Abnormal gait,Pain,Improper body mechanics,Decreased mobility,Increased muscle spasms,Impaired flexibility,Difficulty walking,Decreased balance,Decreased activity tolerance,Decreased endurance,Decreased range of motion,Decreased strength,Hypomobility  Visit Diagnosis: Chronic pain of left knee  Pain, lumbar region  Difficulty walking  Muscle weakness (generalized)     Problem List Patient Active Problem List   Diagnosis Date Noted  . OSA (obstructive sleep apnea) 05/08/2020  . Onychomycosis 10/28/2017  . Spondylosis 06/25/2017  . Post herpetic neuralgia 06/20/2017  . Spinal stenosis of lumbar region at multiple levels 04/30/2017  . Drug reaction 02/05/2017  . Senile purpura ([place])  02/05/2017  .  Dyslipidemia 11/16/2015  . History of ventricular tachycardia 11/08/2015  . CAD S/P PCI   . Ischemic cardiomyopathy   . History of embolic stroke   . History of adenomatous polyp of colon 01/18/2015  . Primary hyperparathyroidism ([place]) 12/28/2013  . History of stomach cancer 12/01/2013  . Fatigue 12/01/2013  . Lower back pain 03/24/2013  . Bradycardia 09/23/2012  . Bruising 09/23/2012  . B12 deficiency 09/17/2010  . History of prostate cancer 12/14/2008  . PERIPHERAL NEUROPATHY 10/18/2007  . PSORIASIS 10/15/2007  . Essential hypertension 11/27/2006  . DEGENERATIVE JOINT DISEASE 11/27/2006  . NEPHROLITHIASIS, HX OF 11/27/2006   Daleen Bo PT, DPT 07/23/20 4:07 PM   [facility] [place], [place], [address] Phone: [phone]   Fax:  [phone]  Name: ANDONI CALMA MRN: [id number] Date of Birth: [birth year]

## 2020-07-26 ENCOUNTER — Encounter: Payer: Self-pay | Admitting: Physical Therapy

## 2020-07-26 ENCOUNTER — Other Ambulatory Visit: Payer: Self-pay

## 2020-07-26 ENCOUNTER — Ambulatory Visit: Payer: Medicare Other | Admitting: Physical Therapy

## 2020-07-26 DIAGNOSIS — M25562 Pain in left knee: Secondary | ICD-10-CM | POA: Diagnosis not present

## 2020-07-26 DIAGNOSIS — R262 Difficulty in walking, not elsewhere classified: Secondary | ICD-10-CM

## 2020-07-26 DIAGNOSIS — M6281 Muscle weakness (generalized): Secondary | ICD-10-CM

## 2020-07-26 DIAGNOSIS — M545 Low back pain, unspecified: Secondary | ICD-10-CM | POA: Diagnosis not present

## 2020-07-26 DIAGNOSIS — G8929 Other chronic pain: Secondary | ICD-10-CM

## 2020-07-26 NOTE — Patient Instructions (Signed)
Access Code: [email or web address] URL: [email or web address] Date: 07/26/2020 Prepared by: Daleen Bo  Exercises Supine Quad Set - 2 x daily - 7 x weekly - 2 sets - 10 reps - 3 hold Seated Hamstring Stretch - 2 x daily - 7 x weekly - 1 sets - 3 reps - 30 hold Supine Bridge with Resistance Band - 1 x daily - 7 x weekly - 2 sets - 10 reps Supine Bridge with Mini Swiss Ball Between Knees - 1 x daily - 7 x weekly - 2 sets - 10 reps Sit to Stand - 1 x daily - 7 x weekly - 2 sets - 10 reps

## 2020-07-26 NOTE — Therapy (Signed)
[facility] [address] [place], [place], [address] Phone: [phone]   Fax:  [phone]  Physical Therapy Treatment  Patient Details  Name: Jose Ware MRN: [id number] Date of Birth: [birth year] Referring Provider (PT): Dr. Garret Reddish   Encounter Date: 07/26/2020   PT End of Session - 07/26/20 1355    Visit Number 7    Number of Visits 17    Date for PT Re-Evaluation 09/01/20    Authorization Type [employer] Medicare    PT Start Time 1347    PT Stop Time 1427    PT Time Calculation (min) 40 min    Activity Tolerance Patient tolerated treatment well    Behavior During Therapy [facility] for tasks assessed/performed           Past Medical History:  Diagnosis Date  . Adenocarcinoma of prostate [facility])    XRT & Radiation seed implantation in 2010  . Adenomatous colon polyp   . CAD (coronary artery disease)    a. 11/04/15:  Acute inferolateral STEMI: S/p emergent DES of a very large LCx with extensive thrombus. Significant residual disease in the proximal LAD and distal RCA. S/p staged PCI of LAD and RCA 8/29.  . Cataract   . CVA (cerebral infarction)    a. 0/0867: embolic CVA after heart cath   . CVA (cerebral infarction)    a. 08/1948: embolic CVA after heart cath   . DIVERTICULITIS, HX OF 11/27/2006       . DJD (degenerative joint disease)    wrist - R   . Hernia   . History of blood transfusion 1985   with colon surgery  . Hypertension   . Ischemic cardiomyopathy    a. EF 25-30% by LV gram on cath 11/04/15. Appeared out of proportion to infarct although infarct was large. EF improved by Echo and now 40-45%.  . Myocardial infarction ([place])   . NEPHROLITHIASIS, HX OF 11/27/2006        . Peripheral neuropathy   . Psoriasis   . Tenosynovitis     Past Surgical History:  Procedure Laterality Date  . APPENDECTOMY  1985  . BACK SURGERY  1980   minor disk surgery: instrumentation placed and removed in a second procedure  .  CARDIAC CATHETERIZATION N/A 11/04/2015   Procedure: Left Heart Cath and Coronary Angiography;  Surgeon: Peter M Martinique, MD;  Location: [facility];  Service: Cardiovascular;  Laterality: N/A;  . CARDIAC CATHETERIZATION N/A 11/04/2015   Procedure: Coronary Stent Intervention;  Surgeon: Peter M Martinique, MD;  Location: [facility];  Service: Cardiovascular;  Laterality: N/A;  . CARDIAC CATHETERIZATION N/A 11/06/2015   Procedure: Coronary Stent Intervention;  Surgeon: Leonie Man, MD;  Location: [facility];  Service: Cardiovascular;  Laterality: N/A;  . CATARACT EXTRACTION, BILATERAL  2013  . CHOLECYSTECTOMY  1986  . COLON SURGERY  1980's   pt. had ileus, had colon surgery, requiring colostomy & then reversal & then dehisence of that wound & return to OR for repair & cholecystectomy    . COLONOSCOPY    . COLOSTOMY  1985   After colectomy for diverticulitis, pt. remarks during this surgery they "gave me the paddles two times  because of bleeding", pt. states it was unrelated to any anesthesia complication  . EYE SURGERY     /w IOL  . KNEE ARTHROSCOPY Left 2003  . KNEE SURGERY  1956   Left 1956, Right w/cartilage removed later  .  PARATHYROIDECTOMY N/A 05/12/2014   Procedure: PARATHYROIDECTOMY;  Surgeon: Armandina Gemma, MD;  Location: [facility];  Service: General;  Laterality: N/A;  . REVERSAL OF COLOSTOMY  1987  . TONSILLECTOMY  1946  . WRIST SURGERY Left    Remote complicated w/infection    There were no vitals filed for this visit.   Subjective Assessment - 07/26/20 1351    Subjective Pt states that the knee is doing okay today. he states he is still very tired today. He had some back pain but it is not lingering at the time.    Diagnostic tests X-ray: none for L knee, degenerative changes L1-5    Patient Stated Goals Pt states he would like to be able to stand and have a conversation with friends without pain.    Currently in Pain? No/denies    Pain Score 0-No pain                              [facility] Adult PT Treatment/Exercise - 07/26/20 0001      Lumbar Exercises: Stretches   Single Knee to Chest Stretch Limitations 10s 5x each    Lower Trunk Rotation Limitations 10x each    Pelvic Tilt Limitations 2x10 2s hold      Knee/Hip Exercises: Stretches   Other Knee/Hip Stretches SL quad stretch 30s 3x    Other Knee/Hip Stretches heel drop 2x10      Knee/Hip Exercises: Standing   Other Standing Knee Exercises STS from standard height 2x10 10lb (power concentric, slow eccentric)     Knee/Hip Exercises: Supine   Bridges with Ball Squeeze 3 sets;10 reps    Bridges with Clamshell 3 sets;10 reps                  PT Education - 07/26/20 1405    Education Details anatomy, exercise progression, focus on L LE strengthening/balance, HEP update for back pain    Person(s) Educated Patient    Methods Explanation;Demonstration;Tactile cues;Verbal cues;Handout    Comprehension Verbalized understanding;Returned demonstration;Verbal cues required;Tactile cues required            PT Short Term Goals - 07/03/20 1424      PT SHORT TERM GOAL #1   Title Pt will become independent with HEP in order to demonstrate synthesis of PT education.    Time 2    Period Weeks    Status New      PT SHORT TERM GOAL #2   Title Pt will be able to demonstrate >5 degs improvement in knee flexion and extension in order to demonstrate functional improvement in L LE function for self-care and house hold duties.    Time 4    Period Weeks    Status New      PT SHORT TERM GOAL #3   Title Pt will be able to perform 5XSTS in under 12  in order to demonstrate functional improvement above the cut off score for adults in that age group.    Time 4    Period Weeks    Status New             PT Long Term Goals - 07/03/20 1425      PT LONG TERM GOAL #1   Title Pt will demonstrate at least a 12.8 improvement in Oswestry Index in order to demonstrate a  clinically significant change in LBP and function.    Time 6    Period  Weeks    Status New      PT LONG TERM GOAL #2   Title Pt will have an at least >9 pt improvement in LEFS measure in order to demonstrate MCID improvement in daily function.    Time 6    Period Weeks    Status New      PT LONG TERM GOAL #3   Title Pt will be able to demonstrate  and report ability to stand for more than 20 mins order to demonstrate functional improvement in LE function, strength, balance, and mobility for safety with community activity.    Time 8    Period Weeks                 Plan - 07/26/20 1355    Clinical Impression Statement Pt exercise program addressed lumbopelvic stability due to recent complaints of increased back pain. Pt's bridge exercises were progressed to include ADD and ABD. STS exercise required heavy VC and TC for abdominal brace and hip hinging in order to promote better motor control during squats. Pt had difficutly generating power from the bottom of the STS once loaded with external load. Pt's HEP was updated to include STS with review of safety precautions to prevent falls. Pt would benefit from continued skilled therapy in order to reach goals and maximize functional LE ength and ROM for prevention of falls and further functional decline.    Personal Factors and Comorbidities Age;Comorbidity 1;Comorbidity 2;Time since onset of injury/illness/exacerbation    Examination-Activity Limitations Lift;Stand;Carry;Squat;Stairs;Transfers    Examination-Participation Restrictions Church;Yard Work;Volunteer;Community Activity;Shop;Other    Stability/Clinical Decision Making Stable/Uncomplicated    Rehab Potential Fair    PT Frequency 2x / week    PT Duration 8 weeks    PT Treatment/Interventions ADLs/Self Care Home Management;Electrical Stimulation;Cryotherapy;Iontophoresis 4mg /ml Dexamethasone;Moist Heat;Ultrasound;Traction;Therapeutic exercise;Therapeutic activities;Functional  mobility training;Stair training;Gait training;Balance training;Neuromuscular re-education;Patient/family education;Orthotic Fit/Training;Manual techniques;Compression bandaging;Passive range of motion;Dry needling;Energy conservation;Taping;Spinal Manipulations;Joint Manipulations;Scar mobilization    PT Next Visit Plan LAQ 10lbs with hold. STS with RFD focus, offset walking/suitcase carry    PT Home Exercise Plan [email or web address]    Consulted and Agree with Plan of Care Patient           Patient will benefit from skilled therapeutic intervention in order to improve the following deficits and impairments:  Abnormal gait,Pain,Improper body mechanics,Decreased mobility,Increased muscle spasms,Impaired flexibility,Difficulty walking,Decreased balance,Decreased activity tolerance,Decreased endurance,Decreased range of motion,Decreased strength,Hypomobility  Visit Diagnosis: Chronic pain of left knee  Pain, lumbar region  Difficulty walking  Muscle weakness (generalized)     Problem List Patient Active Problem List   Diagnosis Date Noted  . OSA (obstructive sleep apnea) 05/08/2020  . Onychomycosis 10/28/2017  . Spondylosis 06/25/2017  . Post herpetic neuralgia 06/20/2017  . Spinal stenosis of lumbar region at multiple levels 04/30/2017  . Drug reaction 02/05/2017  . Senile purpura ([place]) 02/05/2017  . Dyslipidemia 11/16/2015  . History of ventricular tachycardia 11/08/2015  . CAD S/P PCI   . Ischemic cardiomyopathy   . History of embolic stroke   . History of adenomatous polyp of colon 01/18/2015  . Primary hyperparathyroidism ([place]) 12/28/2013  . History of stomach cancer 12/01/2013  . Fatigue 12/01/2013  . Lower back pain 03/24/2013  . Bradycardia 09/23/2012  . Bruising 09/23/2012  . B12 deficiency 09/17/2010  . History of prostate cancer 12/14/2008  . PERIPHERAL NEUROPATHY 10/18/2007  . PSORIASIS 10/15/2007  . Essential hypertension 11/27/2006  . DEGENERATIVE JOINT DISEASE  11/27/2006  . NEPHROLITHIASIS, HX OF 11/27/2006  Daleen Bo PT, DPT 07/26/20 5:03 PM   [facility] [address] [place], [place], [address] Phone: [phone]   Fax:  [phone]  Name: Jose Ware MRN: [id number] Date of Birth: [birth year]

## 2020-07-30 ENCOUNTER — Ambulatory Visit: Payer: Medicare Other | Admitting: Physical Therapy

## 2020-07-30 ENCOUNTER — Other Ambulatory Visit: Payer: Self-pay

## 2020-07-30 ENCOUNTER — Encounter: Payer: Self-pay | Admitting: Physical Therapy

## 2020-07-30 DIAGNOSIS — G8929 Other chronic pain: Secondary | ICD-10-CM

## 2020-07-30 DIAGNOSIS — R262 Difficulty in walking, not elsewhere classified: Secondary | ICD-10-CM

## 2020-07-30 DIAGNOSIS — M6281 Muscle weakness (generalized): Secondary | ICD-10-CM | POA: Diagnosis not present

## 2020-07-30 DIAGNOSIS — M545 Low back pain, unspecified: Secondary | ICD-10-CM | POA: Diagnosis not present

## 2020-07-30 DIAGNOSIS — M25562 Pain in left knee: Secondary | ICD-10-CM

## 2020-07-30 NOTE — Therapy (Signed)
[facility] [address] [place], [place], [address] Phone: [phone]   Fax:  [phone]  Physical Therapy Treatment  Patient Details  Name: Jose Ware MRN: [id number] Date of Birth: 01-12-1935 Referring Provider (PT): Dr. Garret Reddish   Encounter Date: 07/30/2020   PT End of Session - 07/30/20 1339    Visit Number 8    Number of Visits 17    Date for PT Re-Evaluation 09/01/20    Authorization Type UHC Medicare    PT Start Time 1340    PT Stop Time 1420    PT Time Calculation (min) 40 min    Activity Tolerance Patient tolerated treatment well    Behavior During Therapy [facility] for tasks assessed/performed           Past Medical History:  Diagnosis Date  . Adenocarcinoma of prostate [facility])    XRT & Radiation seed implantation in 2010  . Adenomatous colon polyp   . CAD (coronary artery disease)    a. 11/04/15:  Acute inferolateral STEMI: S/p emergent DES of a very large LCx with extensive thrombus. Significant residual disease in the proximal LAD and distal RCA. S/p staged PCI of LAD and RCA 8/29.  . Cataract   . CVA (cerebral infarction)    a. 04/5850: embolic CVA after heart cath   . CVA (cerebral infarction)    a. 09/7822: embolic CVA after heart cath   . DIVERTICULITIS, HX OF 11/27/2006       . DJD (degenerative joint disease)    wrist - R   . Hernia   . History of blood transfusion 1985   with colon surgery  . Hypertension   . Ischemic cardiomyopathy    a. EF 25-30% by LV gram on cath 11/04/15. Appeared out of proportion to infarct although infarct was large. EF improved by Echo and now 40-45%.  . Myocardial infarction ([place])   . NEPHROLITHIASIS, HX OF 11/27/2006        . Peripheral neuropathy   . Psoriasis   . Tenosynovitis     Past Surgical History:  Procedure Laterality Date  . APPENDECTOMY  1985  . BACK SURGERY  1980   minor disk surgery: instrumentation placed and removed in a second procedure  .  CARDIAC CATHETERIZATION N/A 11/04/2015   Procedure: Left Heart Cath and Coronary Angiography;  Surgeon: Peter M Martinique, MD;  Location: [place] CV LAB;  Service: Cardiovascular;  Laterality: N/A;  . CARDIAC CATHETERIZATION N/A 11/04/2015   Procedure: Coronary Stent Intervention;  Surgeon: Peter M Martinique, MD;  Location: [place] CV LAB;  Service: Cardiovascular;  Laterality: N/A;  . CARDIAC CATHETERIZATION N/A 11/06/2015   Procedure: Coronary Stent Intervention;  Surgeon: Leonie Man, MD;  Location: [place] CV LAB;  Service: Cardiovascular;  Laterality: N/A;  . CATARACT EXTRACTION, BILATERAL  2013  . CHOLECYSTECTOMY  1986  . COLON SURGERY  1980's   pt. had ileus, had colon surgery, requiring colostomy & then reversal & then dehisence of that wound & return to OR for repair & cholecystectomy    . COLONOSCOPY    . COLOSTOMY  1985   After colectomy for diverticulitis, pt. remarks during this surgery they "gave me the paddles two times  because of bleeding", pt. states it was unrelated to any anesthesia complication  . EYE SURGERY     /w IOL  . KNEE ARTHROSCOPY Left 2003  . KNEE SURGERY  1956   Left 1956, Right w/cartilage removed later  .  PARATHYROIDECTOMY N/A 05/12/2014   Procedure: PARATHYROIDECTOMY;  Surgeon: Armandina Gemma, MD;  Location: [place];  Service: General;  Laterality: N/A;  . REVERSAL OF COLOSTOMY  1987  . TONSILLECTOMY  1946  . WRIST SURGERY Left    Remote complicated w/infection    There were no vitals filed for this visit.   Subjective Assessment - 07/30/20 1338    Subjective Pt states he had some L knee pain this morning from how he slept. He took a Tylenol and the pain went away.    Diagnostic tests X-ray: none for L knee, degenerative changes L1-5    Patient Stated Goals Pt states he would like to be able to stand and have a conversation with friends without pain.    Currently in Pain? No/denies    Pain Score 0-No pain                              [facility] Adult PT Treatment/Exercise - 07/30/20 0001      Lumbar Exercises: Stretches   Single Knee to Chest Stretch Limitations 10s 5x each    Lower Trunk Rotation Limitations 10x each      Knee/Hip Exercises: Stretches       Other Knee/Hip Stretches heel drop 2x10      Knee/Hip Exercises: Standing   Other Standing Knee Exercises STS from standard height 2x10 10lb; walking pivots and turns 46ft 3x L and R; heel drop at stairs 2x10    Other Standing Knee Exercises Suitcase 10lbs 58ft x2      Knee/Hip Exercises: Seated   Long Arc Quad Weight 10 lbs.    Long Arc Quad Limitations 3x10      Knee/Hip Exercises: Supine   Bridges with Ball Squeeze 3 sets;10 reps    Bridges with Clamshell 3 sets;10 reps   GTB at [employer]                 PT Education - 07/30/20 1424    Education Details anatomy, exercise progression, focus on L LE strengthening/balance, falls prevention, transfer mechanics   Person(s) Educated Patient    Methods Explanation;Demonstration;Verbal cues;Tactile cues    Comprehension Verbalized understanding;Returned demonstration;Verbal cues required;Tactile cues required            PT Short Term Goals - 07/03/20 1424      PT SHORT TERM GOAL #1   Title Pt will become independent with HEP in order to demonstrate synthesis of PT education.    Time 2    Period Weeks    Status New      PT SHORT TERM GOAL #2   Title Pt will be able to demonstrate >5 degs improvement in knee flexion and extension in order to demonstrate functional improvement in L LE function for self-care and house hold duties.    Time 4    Period Weeks    Status New      PT SHORT TERM GOAL #3   Title Pt will be able to perform 5XSTS in under 12  in order to demonstrate functional improvement above the cut off score for adults in that age group.    Time 4    Period Weeks    Status New             PT Long Term Goals - 07/03/20 1425      PT LONG TERM  GOAL #1   Title Pt will demonstrate at least a 12.8  improvement in Oswestry Index in order to demonstrate a clinically significant change in LBP and function.    Time 6    Period Weeks    Status New      PT LONG TERM GOAL #2   Title Pt will have an at least >9 pt improvement in LEFS measure in order to demonstrate MCID improvement in daily function.    Time 6    Period Weeks    Status New      PT LONG TERM GOAL #3   Title Pt will be able to demonstrate  and report ability to stand for more than 20 mins order to demonstrate functional improvement in LE function, strength, balance, and mobility for safety with community activity.    Time 8    Period Weeks                 Plan - 07/30/20 1338    Clinical Impression Statement Pt was able to progress functional mobility exercise to weighted suitcase carry with CGA. Pt required increased cuing for lengthened stride and arm swing during. Pt also given increased cuing for set up during STS due to decreased forward weight shift and COG being more posterior while in sitting position. Pt is able to perform walking pivots and COD with less choppy steps, but reports significant fear of falling during gait causing shuffling during direction change. Pt to reduce frequency of visits as he is independent with LE strengthening exercise and would like to use therapy sessions to focus on functional balance and improving gait. Pt would benefit from continued skilled therapy in order to reach goals and maximize functional LE ength and ROM for prevention of falls and further functional decline.    Personal Factors and Comorbidities Age;Comorbidity 1;Comorbidity 2;Time since onset of injury/illness/exacerbation    Examination-Activity Limitations Lift;Stand;Carry;Squat;Stairs;Transfers    Examination-Participation Restrictions Church;Yard Work;Volunteer;Community Activity;Shop;Other    Stability/Clinical Decision Making Stable/Uncomplicated    Rehab  Potential Fair    PT Frequency 2x / week    PT Duration 8 weeks    PT Treatment/Interventions ADLs/Self Care Home Management;Electrical Stimulation;Cryotherapy;Iontophoresis 4mg /ml Dexamethasone;Moist Heat;Ultrasound;Traction;Therapeutic exercise;Therapeutic activities;Functional mobility training;Stair training;Gait training;Balance training;Neuromuscular re-education;Patient/family education;Orthotic Fit/Training;Manual techniques;Compression bandaging;Passive range of motion;Dry needling;Energy conservation;Taping;Spinal Manipulations;Joint Manipulations;Scar mobilization    PT Next Visit Plan COD gait, NBOS on foam, tandem, standing reaching balance    PT Home Exercise Plan [email or web address]    Consulted and Agree with Plan of Care Patient           Patient will benefit from skilled therapeutic intervention in order to improve the following deficits and impairments:  Abnormal gait,Pain,Improper body mechanics,Decreased mobility,Increased muscle spasms,Impaired flexibility,Difficulty walking,Decreased balance,Decreased activity tolerance,Decreased endurance,Decreased range of motion,Decreased strength,Hypomobility  Visit Diagnosis: Chronic pain of left knee  Pain, lumbar region  Difficulty walking  Muscle weakness (generalized)     Problem List Patient Active Problem List   Diagnosis Date Noted  . OSA (obstructive sleep apnea) 05/08/2020  . Onychomycosis 10/28/2017  . Spondylosis 06/25/2017  . Post herpetic neuralgia 06/20/2017  . Spinal stenosis of lumbar region at multiple levels 04/30/2017  . Drug reaction 02/05/2017  . Senile purpura ([place]) 02/05/2017  . Dyslipidemia 11/16/2015  . History of ventricular tachycardia 11/08/2015  . CAD S/P PCI   . Ischemic cardiomyopathy   . History of embolic stroke   . History of adenomatous polyp of colon 01/18/2015  . Primary hyperparathyroidism ([place]) 12/28/2013  . History of stomach cancer 12/01/2013  . Fatigue 12/01/2013  . Lower back  pain 03/24/2013  . Bradycardia 09/23/2012  . Bruising 09/23/2012  . B12 deficiency 09/17/2010  . History of prostate cancer 12/14/2008  . PERIPHERAL NEUROPATHY 10/18/2007  . PSORIASIS 10/15/2007  . Essential hypertension 11/27/2006  . DEGENERATIVE JOINT DISEASE 11/27/2006  . NEPHROLITHIASIS, HX OF 11/27/2006    Daleen Bo PT, DPT 07/30/20 2:29 PM   [facility] [address] [place], [place], [address] Phone: [phone]   Fax:  [phone]  Name: Jose Ware MRN: [id number] Date of Birth: November 01, 1934

## 2020-08-02 ENCOUNTER — Encounter: Payer: Medicare Other | Admitting: Physical Therapy

## 2020-08-02 ENCOUNTER — Other Ambulatory Visit: Payer: Self-pay

## 2020-08-09 ENCOUNTER — Ambulatory Visit: Payer: Medicare Other | Admitting: Adult Health

## 2020-08-13 ENCOUNTER — Other Ambulatory Visit: Payer: Self-pay

## 2020-08-13 ENCOUNTER — Ambulatory Visit (INDEPENDENT_AMBULATORY_CARE_PROVIDER_SITE_OTHER): Payer: Medicare Other | Admitting: Physical Therapy

## 2020-08-13 ENCOUNTER — Encounter: Payer: Self-pay | Admitting: Physical Therapy

## 2020-08-13 DIAGNOSIS — G8929 Other chronic pain: Secondary | ICD-10-CM

## 2020-08-13 DIAGNOSIS — R262 Difficulty in walking, not elsewhere classified: Secondary | ICD-10-CM

## 2020-08-13 DIAGNOSIS — M545 Low back pain, unspecified: Secondary | ICD-10-CM

## 2020-08-13 DIAGNOSIS — M6281 Muscle weakness (generalized): Secondary | ICD-10-CM | POA: Diagnosis not present

## 2020-08-13 DIAGNOSIS — M25562 Pain in left knee: Secondary | ICD-10-CM

## 2020-08-13 NOTE — Patient Instructions (Signed)
Access Code: [email or web address] URL: [email or web address] Date: 08/13/2020 Prepared by: Daleen Bo  Exercises Seated Hamstring Stretch - 2 x daily - 7 x weekly - 1 sets - 3 reps - 30 hold Supine Bridge with Resistance Band - 1 x daily - 7 x weekly - 2 sets - 10 reps Supine Bridge with Mini Swiss Ball Between Knees - 1 x daily - 7 x weekly - 2 sets - 10 reps Sit to Stand - 1 x daily - 7 x weekly - 2 sets - 10 reps Romberg Stance - 1 x daily - 7 x weekly - 1 sets - 3 reps - 30 hold

## 2020-08-13 NOTE — Therapy (Signed)
[facility] [address] [place], [place], [address] Phone: [phone]   Fax:  [phone]  Physical Therapy Treatment  Patient Details  Name: Jose Ware MRN: [id number] Date of Birth: [birth year] Referring Provider (PT): Dr. Garret Reddish   Encounter Date: 08/13/2020   PT End of Session - 08/13/20 1305    Visit Number 9    Number of Visits 17    Date for PT Re-Evaluation 09/01/20    Authorization Type UHC Medicare    PT Start Time 1300    PT Stop Time 1345    PT Time Calculation (min) 45 min    Equipment Utilized During Treatment Gait belt    Activity Tolerance Patient tolerated treatment well    Behavior During Therapy [facility] for tasks assessed/performed           Past Medical History:  Diagnosis Date  . Adenocarcinoma of prostate [facility])    XRT & Radiation seed implantation in 2010  . Adenomatous colon polyp   . CAD (coronary artery disease)    a. 11/04/15:  Acute inferolateral STEMI: S/p emergent DES of a very large LCx with extensive thrombus. Significant residual disease in the proximal LAD and distal RCA. S/p staged PCI of LAD and RCA 8/29.  . Cataract   . CVA (cerebral infarction)    a. 06/5807: embolic CVA after heart cath   . CVA (cerebral infarction)    a. 11/8336: embolic CVA after heart cath   . DIVERTICULITIS, HX OF 11/27/2006       . DJD (degenerative joint disease)    wrist - R   . Hernia   . History of blood transfusion 1985   with colon surgery  . Hypertension   . Ischemic cardiomyopathy    a. EF 25-30% by LV gram on cath 11/04/15. Appeared out of proportion to infarct although infarct was large. EF improved by Echo and now 40-45%.  . Myocardial infarction ([place])   . NEPHROLITHIASIS, HX OF 11/27/2006        . Peripheral neuropathy   . Psoriasis   . Tenosynovitis     Past Surgical History:  Procedure Laterality Date  . APPENDECTOMY  1985  . BACK SURGERY  1980   minor disk surgery: instrumentation  placed and removed in a second procedure  . CARDIAC CATHETERIZATION N/A 11/04/2015   Procedure: Left Heart Cath and Coronary Angiography;  Surgeon: Peter M Martinique, MD;  Location: [place] CV LAB;  Service: Cardiovascular;  Laterality: N/A;  . CARDIAC CATHETERIZATION N/A 11/04/2015   Procedure: Coronary Stent Intervention;  Surgeon: Peter M Martinique, MD;  Location: [place] CV LAB;  Service: Cardiovascular;  Laterality: N/A;  . CARDIAC CATHETERIZATION N/A 11/06/2015   Procedure: Coronary Stent Intervention;  Surgeon: Leonie Man, MD;  Location: [place] CV LAB;  Service: Cardiovascular;  Laterality: N/A;  . CATARACT EXTRACTION, BILATERAL  2013  . CHOLECYSTECTOMY  1986  . COLON SURGERY  1980's   pt. had ileus, had colon surgery, requiring colostomy & then reversal & then dehisence of that wound & return to OR for repair & cholecystectomy    . COLONOSCOPY    . COLOSTOMY  1985   After colectomy for diverticulitis, pt. remarks during this surgery they "gave me the paddles two times  because of bleeding", pt. states it was unrelated to any anesthesia complication  . EYE SURGERY     /w IOL  . KNEE ARTHROSCOPY Left 2003  . KNEE SURGERY  1956   Left 1956, Right w/cartilage removed later  . PARATHYROIDECTOMY N/A 05/12/2014   Procedure: PARATHYROIDECTOMY;  Surgeon: Armandina Gemma, MD;  Location: [place] [facility];  Service: General;  Laterality: N/A;  . REVERSAL OF COLOSTOMY  1987  . TONSILLECTOMY  1946  . WRIST SURGERY Left    Remote complicated w/infection    There were no vitals filed for this visit.   Subjective Assessment - 08/13/20 1300    Subjective Pt states he is doing well. He had one some pain and soreness while making a turn on the R knee but otherwise "everything is good."    Diagnostic tests X-ray: none for L knee, degenerative changes L1-5    Patient Stated Goals Pt states he would like to be able to stand and have a conversation with friends without pain.    Currently in Pain? No/denies     Pain Score 0-No pain                             [facility] Adult PT Treatment/Exercise - 08/13/20 0001      Lumbar Exercises: Stretches   Single Knee to Chest Stretch Limitations 10s 5x each              Knee/Hip Exercises: Stretches   Other Knee/Hip Stretches heel drop 2x10 at stair      Knee/Hip Exercises: Aerobic   Recumbent Bike L1 5 min warmup      Knee/Hip Exercises: Standing   Other Standing Knee Exercises STS from standard height 2x10 10lb; walking pivots and turns 73ft 6x L and R withand without SPC    Other Standing Knee Exercises Suitcase 10lbs 76ft x2; NBOS balance 30s 2x; semi tandem 30s 3x; NBOS on foam 30s 3x   CGA-minA for all balance and gait activities                                             PT Short Term Goals - 07/03/20 1424      PT SHORT TERM GOAL #1   Title Pt will become independent with HEP in order to demonstrate synthesis of PT education.    Time 2    Period Weeks    Status New      PT SHORT TERM GOAL #2   Title Pt will be able to demonstrate >5 degs improvement in knee flexion and extension in order to demonstrate functional improvement in L LE function for self-care and house hold duties.    Time 4    Period Weeks    Status New      PT SHORT TERM GOAL #3   Title Pt will be able to perform 5XSTS in under 12  in order to demonstrate functional improvement above the cut off score for adults in that age group.    Time 4    Period Weeks    Status New             PT Long Term Goals - 07/03/20 1425      PT LONG TERM GOAL #1   Title Pt will demonstrate at least a 12.8 improvement in Oswestry Index in order to demonstrate a clinically significant change in LBP and function.    Time 6    Period Weeks    Status New      PT  LONG TERM GOAL #2   Title Pt will have an at least >9 pt improvement in LEFS measure in order to demonstrate MCID improvement in daily function.    Time 6    Period Weeks    Status  New      PT LONG TERM GOAL #3   Title Pt will be able to demonstrate  and report ability to stand for more than 20 mins order to demonstrate functional improvement in LE function, strength, balance, and mobility for safety with community activity.    Time 8    Period Weeks                 Plan - 08/13/20 1406    Clinical Impression Statement Pt demonstrated significant difficulty with balance today that required CGA-minA with both static and dynamic movements. Static standing balance had increased postural sway with recurring crepitus and cavitations. Pt given verbal cuing for gaze, SPC mechanics, and change of direction during activity. Pt's balance is greatly affected by LE weakness as well as fear of falling, but responds well to reinforcement and tactile cuing during exercise. Plan to increased STS intensity and trying obstacle navigation. Pt would benefit from continued skilled therapy in order to reach goals and maximize functional LE ength and ROM for prevention of falls and further functional decline.    Personal Factors and Comorbidities Age;Comorbidity 1;Comorbidity 2;Time since onset of injury/illness/exacerbation    Examination-Activity Limitations Lift;Stand;Carry;Squat;Stairs;Transfers    Examination-Participation Restrictions Church;Yard Work;Volunteer;Community Activity;Shop;Other    Stability/Clinical Decision Making Stable/Uncomplicated    Rehab Potential Fair    PT Frequency 2x / week    PT Duration 8 weeks    PT Treatment/Interventions ADLs/Self Care Home Management;Electrical Stimulation;Cryotherapy;Iontophoresis 4mg /ml Dexamethasone;Moist Heat;Ultrasound;Traction;Therapeutic exercise;Therapeutic activities;Functional mobility training;Stair training;Gait training;Balance training;Neuromuscular re-education;Patient/family education;Orthotic Fit/Training;Manual techniques;Compression bandaging;Passive range of motion;Dry needling;Energy conservation;Taping;Spinal  Manipulations;Joint Manipulations;Scar mobilization    PT Next Visit Plan COD gait, NBOS on foam, tandem, standing reaching balance, obstacle course    PT Home Exercise Plan [email or web address]    Consulted and Agree with Plan of Care Patient           Patient will benefit from skilled therapeutic intervention in order to improve the following deficits and impairments:  Abnormal gait,Pain,Improper body mechanics,Decreased mobility,Increased muscle spasms,Impaired flexibility,Difficulty walking,Decreased balance,Decreased activity tolerance,Decreased endurance,Decreased range of motion,Decreased strength,Hypomobility  Visit Diagnosis: Chronic pain of left knee  Pain, lumbar region  Difficulty walking  Muscle weakness (generalized)     Problem List Patient Active Problem List   Diagnosis Date Noted  . OSA (obstructive sleep apnea) 05/08/2020  . Onychomycosis 10/28/2017  . Spondylosis 06/25/2017  . Post herpetic neuralgia 06/20/2017  . Spinal stenosis of lumbar region at multiple levels 04/30/2017  . Drug reaction 02/05/2017  . Senile purpura ([place]) 02/05/2017  . Dyslipidemia 11/16/2015  . History of ventricular tachycardia 11/08/2015  . CAD S/P PCI   . Ischemic cardiomyopathy   . History of embolic stroke   . History of adenomatous polyp of colon 01/18/2015  . Primary hyperparathyroidism ([place]) 12/28/2013  . History of stomach cancer 12/01/2013  . Fatigue 12/01/2013  . Lower back pain 03/24/2013  . Bradycardia 09/23/2012  . Bruising 09/23/2012  . B12 deficiency 09/17/2010  . History of prostate cancer 12/14/2008  . PERIPHERAL NEUROPATHY 10/18/2007  . PSORIASIS 10/15/2007  . Essential hypertension 11/27/2006  . DEGENERATIVE JOINT DISEASE 11/27/2006  . NEPHROLITHIASIS, HX OF 11/27/2006   Daleen Bo PT, DPT 08/13/20 2:44 PM   [facility]  [place], [place], [address] Phone: [phone]   Fax:  [phone]  Name:  Jose Ware MRN: [id number] Date of Birth: 05-Nov-1934   Access Code: [email or web address] URL: [email or web address] Date: 08/13/2020 Prepared by: Daleen Bo  Exercises Seated Hamstring Stretch - 2 x daily - 7 x weekly - 1 sets - 3 reps - 30 hold Supine Bridge with Resistance Band - 1 x daily - 7 x weekly - 2 sets - 10 reps Supine Bridge with Mini Swiss Ball Between Knees - 1 x daily - 7 x weekly - 2 sets - 10 reps Sit to Stand - 1 x daily - 7 x weekly - 2 sets - 10 reps Romberg Stance - 1 x daily - 7 x weekly - 1 sets - 3 reps - 30 hold

## 2020-08-20 ENCOUNTER — Encounter: Payer: Self-pay | Admitting: Physical Therapy

## 2020-08-20 ENCOUNTER — Other Ambulatory Visit: Payer: Self-pay

## 2020-08-20 ENCOUNTER — Ambulatory Visit: Payer: Medicare Other | Admitting: Physical Therapy

## 2020-08-20 DIAGNOSIS — M545 Low back pain, unspecified: Secondary | ICD-10-CM | POA: Diagnosis not present

## 2020-08-20 DIAGNOSIS — G8929 Other chronic pain: Secondary | ICD-10-CM | POA: Diagnosis not present

## 2020-08-20 DIAGNOSIS — M25562 Pain in left knee: Secondary | ICD-10-CM

## 2020-08-20 DIAGNOSIS — M6281 Muscle weakness (generalized): Secondary | ICD-10-CM | POA: Diagnosis not present

## 2020-08-20 NOTE — Therapy (Signed)
[facility] [address] [place], [place], [address] Phone: [phone]   Fax:  [phone]  Physical Therapy Progress Note  Patient Details  Name: Jose Ware MRN: [id number] Date of Birth: [birth year] Referring Provider (PT): Dr. Garret Reddish   Encounter Date: 08/20/2020   PT End of Session - 08/20/20 1304     Visit Number 10    Number of Visits 17    Date for PT Re-Evaluation 09/01/20    Authorization Type [employer] Medicare    PT Start Time 1300    PT Stop Time 1340    PT Time Calculation (min) 40 min    Equipment Utilized During Treatment Gait belt    Activity Tolerance Patient tolerated treatment well    Behavior During Therapy [facility] for tasks assessed/performed             Past Medical History:  Diagnosis Date   Adenocarcinoma of prostate ([place])    XRT & Radiation seed implantation in 2010   Adenomatous colon polyp    CAD (coronary artery disease)    a. 11/04/15:  Acute inferolateral STEMI: S/p emergent DES of a very large LCx with extensive thrombus. Significant residual disease in the proximal LAD and distal RCA. S/p staged PCI of LAD and RCA 8/29.   Cataract    CVA (cerebral infarction)    a. 09/261: embolic CVA after heart cath    CVA (cerebral infarction)    a. 09/8586: embolic CVA after heart cath    DIVERTICULITIS, HX OF 11/27/2006        DJD (degenerative joint disease)    wrist - R    Hernia    History of blood transfusion 1985   with colon surgery   Hypertension    Ischemic cardiomyopathy    a. EF 25-30% by LV gram on cath 11/04/15. Appeared out of proportion to infarct although infarct was large. EF improved by Echo and now 40-45%.   Myocardial infarction ([place])    NEPHROLITHIASIS, HX OF 11/27/2006         Peripheral neuropathy    Psoriasis    Tenosynovitis     Past Surgical History:  Procedure Laterality Date   [place]   minor disk surgery: instrumentation placed  and removed in a second procedure   CARDIAC CATHETERIZATION N/A 11/04/2015   Procedure: Left Heart Cath and Coronary Angiography;  Surgeon: Peter M Martinique, MD;  Location: [facility];  Service: Cardiovascular;  Laterality: N/A;   CARDIAC CATHETERIZATION N/A 11/04/2015   Procedure: Coronary Stent Intervention;  Surgeon: Peter M Martinique, MD;  Location: [facility];  Service: Cardiovascular;  Laterality: N/A;   CARDIAC CATHETERIZATION N/A 11/06/2015   Procedure: Coronary Stent Intervention;  Surgeon: Leonie Man, MD;  Location: [facility];  Service: Cardiovascular;  Laterality: N/A;   CATARACT EXTRACTION, BILATERAL  2013   CHOLECYSTECTOMY  1986   COLON SURGERY  1980's   pt. had ileus, had colon surgery, requiring colostomy & then reversal & then dehisence of that wound & return to OR for repair & cholecystectomy     COLONOSCOPY     COLOSTOMY  1985   After colectomy for diverticulitis, pt. remarks during this surgery they "gave me the paddles two times  because of bleeding", pt. states it was unrelated to any anesthesia complication   EYE SURGERY     /w IOL   KNEE ARTHROSCOPY Left 2003  KNEE SURGERY  1956   Left 1956, Right w/cartilage removed later   PARATHYROIDECTOMY N/A 05/12/2014   Procedure: PARATHYROIDECTOMY;  Surgeon: Armandina Gemma, MD;  Location: [facility];  Service: General;  Laterality: N/A;   [place]   WRIST SURGERY Left    Remote complicated w/infection    There were no vitals filed for this visit.   Subjective Assessment - 08/20/20 1300     Subjective Pt states the knee is doing well. He had to stand for quite some time a funeral earlier today and he feels "frustrated" with the standing. Pt states he is about 75-80% better since our first visit.    Diagnostic tests X-ray: none for L knee, degenerative changes L1-5    Patient Stated Goals Pt states he would like to be able to stand and have a conversation with friends  without pain.    Currently in Pain? No/denies    Pain Score 0-No pain                [facility] PT Assessment - 08/20/20 0001       Assessment   Medical Diagnosis Low back pain    Referring Provider (PT) Dr. Garret Reddish      Precautions   Precautions None      Restrictions   Weight Bearing Restrictions No      Balance Screen   Has the patient fallen in the past 6 months No      [place] residence    Type of [place] to enter    Entrance Stairs-Number of Steps 1    [place] One level      Prior Function   Level of Independence Independent    Leisure volunteering, fishing, going to the gym 3x a week to do Charity fundraiser for UE and LE      Cognition   Overall Cognitive Status Within Functional Limits for tasks assessed      Observation/Other Assessments   Other Surveys  Oswestry Disability Index;Lower Extremity Functional Scale    Oswestry Disability Index  24% disability 12/50    Lower Extremity Functional Scale  45%  function      Sensation   Light Touch Appears Intact      Functional Tests   Functional tests Squat;Sit to Stand      Squat   Comments unable to perform without UE support due to balance and L knee loading      Sit to Stand   Comments 16.6 no UE support 5x STS test      Posture/Postural Control   Posture/Postural Control Postural limitations    Postural Limitations Flexed trunk;Decreased lumbar lordosis      Deep Tendon Reflexes   DTR Assessment Site Patella    Patella DTR 2+      AROM   Overall AROM  Deficits    Overall AROM Comments L/S: 40% limitation in all directions, CGA due to balance,  no pain with end range; L knee flexion 109, extension -11 deg flexion      PROM   Overall PROM Comments L knee ext: 0 deg, 119 flex      Strength   Overall Strength Comments L knee 4+/5 ext, 4+/5 flex; R knee WNL  Ambulation Distance (Feet) 35 Feet    Assistive device Other (Comment)   No AD used     Balance   Balance Assessed Yes      Static Standing Balance   Static Standing Balance -  Activities  Single Leg Stance - Right Leg;Single Leg Stance - Left Leg   <5s each     Standardized Balance Assessment   Standardized Balance Assessment Timed Up and Go Test      Timed Up and Go Test   Normal TUG (seconds) 12.5                           [facility] Adult PT Treatment/Exercise - 08/20/20 0001       Transfers   Five time sit to stand comments  --      Ambulation/Gait   Gait Pattern Shuffle;Trunk flexed      High Level Balance   High Level Balance Activities Backward walking;Tandem walking   26f x4     Lumbar Exercises: Stretches   Other Lumbar Stretch Exercise standing NBOS on foam 15-30s 3x EO and EC;  tandem 30s 3x; sides stepping 364f2; crossover step d/c due to LOB      Knee/Hip Exercises: Stretches   Other Knee/Hip Stretches heel drop 2x10 at stair      Knee/Hip Exercises: Standing       Other Standing Knee Exercises  NBOS balance 30s 2x; semi tandem 30s 3x; NBOS on foam 30s 3x   CGA for all balance activities                   PT Education - 08/20/20 1304     Education Details anatomy, exercise progression, focus on L LE strengthening/balance, falls prevention, exam findings    Person(s) Educated Patient    Methods Explanation;Demonstration;Tactile cues;Verbal cues    Comprehension Verbalized understanding;Returned demonstration;Tactile cues required;Verbal cues required              PT Short Term Goals - 08/20/20 1305       PT SHORT TERM GOAL #1   Title Pt will become independent with HEP in order to demonstrate synthesis of PT education.    Time 2    Period Weeks    Status Achieved      PT SHORT TERM GOAL #2   Title Pt will be able to demonstrate >5 degs improvement in knee flexion and  extension in order to demonstrate functional improvement in L LE function for self-care and house hold duties.    Time 4    Period Weeks    Status Achieved      PT SHORT TERM GOAL #3   Title Pt will be able to perform 5XSTS in under 12  in order to demonstrate functional improvement above the cut off score for adults in that age group.    Time 4    Period Weeks    Status Partially Met               PT Long Term Goals - 08/20/20 1305       PT LONG TERM GOAL #1   Title Pt will demonstrate at least a 12.8 improvement in Oswestry Index in order to demonstrate a clinically significant change in LBP and function.    Time 6    Period Weeks    Status Partially Met      PT LONG TERM GOAL #2   Title Pt will  have an at least >9 pt improvement in LEFS measure in order to demonstrate MCID improvement in daily function.    Time 6    Period Weeks    Status On-going      PT LONG TERM GOAL #3   Title Pt will be able to demonstrate  and report ability to stand for more than 20 mins order to demonstrate functional improvement in LE function, strength, balance, and mobility for safety with community activity.    Time 8    Period Weeks    Status On-going                   Plan - 08/20/20 1538     Clinical Impression Statement Pt demonstrates signficant improvement in LE strength, ROM, transfers, LBP, and balance since IE as demonstrated by objective measures and pt report. Pt now able to perform STS transfer without UE support and has improved quality of gait with cuing. Of note, LEFS measure decreased in scoring but pt states that is more related to confidence in balance rather than L knee pain. Pt is ambulating without AD on level surface at this time and has improved functional mobility. Pt still with deficits with dynamic gait/balance. Plan to increased STS intensity and trying obstacle navigation at next session. Pt would benefit from continued skilled therapy in order to reach  goals and maximize functional LE ength and ROM for prevention of falls and further functional decline.    Personal Factors and Comorbidities Age;Comorbidity 1;Comorbidity 2;Time since onset of injury/illness/exacerbation    Examination-Activity Limitations Lift;Stand;Carry;Squat;Stairs;Transfers    Examination-Participation Restrictions Church;Yard Work;Volunteer;Community Activity;Shop;Other    Stability/Clinical Decision Making Stable/Uncomplicated    Rehab Potential Fair    PT Frequency 2x / week    PT Duration 8 weeks    PT Treatment/Interventions ADLs/Self Care Home Management;Electrical Stimulation;Cryotherapy;Iontophoresis 73m/ml Dexamethasone;Moist Heat;Ultrasound;Traction;Therapeutic exercise;Therapeutic activities;Functional mobility training;Stair training;Gait training;Balance training;Neuromuscular re-education;Patient/family education;Orthotic Fit/Training;Manual techniques;Compression bandaging;Passive range of motion;Dry needling;Energy conservation;Taping;Spinal Manipulations;Joint Manipulations;Scar mobilization    PT Next Visit Plan COD gait, NBOS on foam, tandem, standing reaching balance, obstacle course    PT Home Exercise Plan [id number]    Consulted and Agree with Plan of Care Patient             Patient will benefit from skilled therapeutic intervention in order to improve the following deficits and impairments:  Abnormal gait, Pain, Improper body mechanics, Decreased mobility, Increased muscle spasms, Impaired flexibility, Difficulty walking, Decreased balance, Decreased activity tolerance, Decreased endurance, Decreased range of motion, Decreased strength, Hypomobility  Visit Diagnosis: Chronic pain of left knee  Pain, lumbar region  Muscle weakness (generalized)     Problem List Patient Active Problem List   Diagnosis Date Noted   OSA (obstructive sleep apnea) 05/08/2020   Onychomycosis 10/28/2017   Spondylosis 06/25/2017   Post herpetic neuralgia  06/20/2017   Spinal stenosis of lumbar region at multiple levels 04/30/2017   Drug reaction 02/05/2017   Senile purpura ([place] 02/05/2017   Dyslipidemia 11/16/2015   History of ventricular tachycardia 11/08/2015   CAD S/P PCI    Ischemic cardiomyopathy    History of embolic stroke    History of adenomatous polyp of colon 01/18/2015   [place]roidism ([place] 12/28/2013   History of stomach cancer 12/01/2013   Fatigue 12/01/2013   Lower back pain 03/24/2013   Bradycardia 09/23/2012   Bruising 09/23/2012   B12 deficiency 09/17/2010   History of prostate cancer 12/14/2008   PERIPHERAL NEUROPATHY 10/18/2007   PSORIASIS 10/15/2007  Essential hypertension 11/27/2006   DEGENERATIVE JOINT DISEASE 11/27/2006   NEPHROLITHIASIS, HX OF 11/27/2006   Daleen Bo PT, DPT 08/20/20 3:43 PM   [facility] [place], [place], [address] Phone: [phone]   Fax:  [phone]  Name: Jose Ware MRN: [id number] Date of Birth: 05-23-1934

## 2020-08-27 ENCOUNTER — Encounter: Payer: Self-pay | Admitting: Family Medicine

## 2020-08-29 ENCOUNTER — Encounter: Payer: Medicare Other | Admitting: Physical Therapy

## 2020-09-03 ENCOUNTER — Encounter: Payer: Medicare Other | Admitting: Physical Therapy

## 2020-09-08 ENCOUNTER — Other Ambulatory Visit: Payer: Self-pay | Admitting: Family Medicine

## 2020-11-07 ENCOUNTER — Encounter: Payer: Self-pay | Admitting: Physician Assistant

## 2020-11-07 ENCOUNTER — Ambulatory Visit: Payer: Medicare Other | Admitting: Physician Assistant

## 2020-11-07 ENCOUNTER — Other Ambulatory Visit: Payer: Self-pay

## 2020-11-07 VITALS — BP 140/70 | HR 51 | Resp 20 | Ht 73.0 in | Wt 210.0 lb

## 2020-11-07 DIAGNOSIS — I255 Ischemic cardiomyopathy: Secondary | ICD-10-CM

## 2020-11-07 DIAGNOSIS — I10 Essential (primary) hypertension: Secondary | ICD-10-CM | POA: Diagnosis not present

## 2020-11-07 DIAGNOSIS — E785 Hyperlipidemia, unspecified: Secondary | ICD-10-CM

## 2020-11-07 DIAGNOSIS — I2510 Atherosclerotic heart disease of native coronary artery without angina pectoris: Secondary | ICD-10-CM

## 2020-11-07 NOTE — Progress Notes (Signed)
Cardiology Office Note:    Date:  11/09/2020   ID:  NAIN BRASINGTON, DOB 1934/03/17, MRN [id number]  PCP:  [name], MD   [facility] Providers Cardiologist:  Peter Martinique, MD     Referring MD: [name], MD   Chief Complaint  Patient presents with   Follow-up    Seen for Dr. Martinique     History of Present Illness:    Jose Ware is a 86 y.o. male with a hx of CAD, hypertension, remote tobacco abuse, psoriasis, history of prostate cancer s/p XRT and radiation seed therapy, obstructive sleep apnea on CPAP.  He was initially presented as inferolateral STEMI in August 2017.  Emergent cardiac catheterization revealed a very large left circumflex artery with extensive thrombus.  He underwent DES PCI of left circumflex artery.  He had NSVT with reperfusion.  Patient also had residual RCA and LAD disease and a EF of 25% at the time of cath.  Echocardiogram obtained on the following day on 11/05/2015 showed EF was 40 to 45%.  On 11/06/2015, he underwent staged PCI of LAD and RCA.  Unfortunately, after the staged PCI, he developed issue with word finding on 11/07/2015.  MRI revealed scattered small acute infarct in the bilateral anterior and posterior circulation.  Carotid Doppler revealed mild plaque bilaterally.  Patient was last seen by Dr. Martinique in March 2022 at which time he was doing well.  Patient presents today for follow-up.  He denies any recent chest pain or worsening dyspnea.  Heart rate is still borderline low.  He denies any dizziness associated with low heart rate.  Otherwise, he is doing well from a cardiac perspective and can follow-up in 6 months.  Her blood pressure is mildly elevated at 140/70.  Even on manual recheck today it was 138/70.  This is the highest blood pressure he has had recently.  He was surprised by it.  I asked him to keep monitoring blood pressure for the next 2 weeks.  If systolic blood pressure is persistently above 135 mmHg, I  instructed the patient to double up on the amlodipine to 10 mg daily and inform us.  Past Medical History:  Diagnosis Date   Adenocarcinoma of prostate [facility])    XRT & Radiation seed implantation in 2010   Adenomatous colon polyp    CAD (coronary artery disease)    a. 11/04/15:  Acute inferolateral STEMI: S/p emergent DES of a very large LCx with extensive thrombus. Significant residual disease in the proximal LAD and distal RCA. S/p staged PCI of LAD and RCA 8/29.   Cataract    CVA (cerebral infarction)    a. 123456: embolic CVA after heart cath    CVA (cerebral infarction)    a. 123456: embolic CVA after heart cath    DIVERTICULITIS, HX OF 11/27/2006        DJD (degenerative joint disease)    wrist - R    Hernia    History of blood transfusion 1985   with colon surgery   Hypertension    Ischemic cardiomyopathy    a. EF 25-30% by LV gram on cath 11/04/15. Appeared out of proportion to infarct although infarct was large. EF improved by Echo and now 40-45%.   Myocardial infarction ([place])    NEPHROLITHIASIS, HX OF 11/27/2006         Peripheral neuropathy    Psoriasis    Tenosynovitis     Past Surgical History:  Procedure Laterality Date  [place]   minor disk surgery: instrumentation placed and removed in a second procedure   CARDIAC CATHETERIZATION N/A 11/04/2015   Procedure: Left Heart Cath and Coronary Angiography;  Surgeon: Peter M Martinique, MD;  Location: [facility];  Service: Cardiovascular;  Laterality: N/A;   CARDIAC CATHETERIZATION N/A 11/04/2015   Procedure: Coronary Stent Intervention;  Surgeon: Peter M Martinique, MD;  Location: [facility];  Service: Cardiovascular;  Laterality: N/A;   CARDIAC CATHETERIZATION N/A 11/06/2015   Procedure: Coronary Stent Intervention;  Surgeon: Leonie Man, MD;  Location: [facility];  Service: Cardiovascular;  Laterality: N/A;   CATARACT EXTRACTION, BILATERAL  2013   CHOLECYSTECTOMY  1986    COLON SURGERY  1980's   pt. had ileus, had colon surgery, requiring colostomy & then reversal & then dehisence of that wound & return to OR for repair & cholecystectomy     COLONOSCOPY     COLOSTOMY  1985   After colectomy for diverticulitis, pt. remarks during this surgery they "gave me the paddles two times  because of bleeding", pt. states it was unrelated to any anesthesia complication   EYE SURGERY     /w IOL   KNEE ARTHROSCOPY Left 2003   [place], Right w/cartilage removed later   PARATHYROIDECTOMY N/A 05/12/2014   Procedure: PARATHYROIDECTOMY;  Surgeon: Armandina Gemma, MD;  Location: [place];  Service: General;  Laterality: N/A;   REVERSAL OF COLOSTOMY  1987   TONSILLECTOMY  1946   WRIST SURGERY Left    Remote complicated w/infection    Current Medications: Current Meds  Medication Sig   acetaminophen (TYLENOL) 325 MG tablet Take 650 mg by mouth as needed.   amLODipine (NORVASC) 5 MG tablet Take 1 tablet by mouth once daily   aspirin EC 81 MG tablet Take 1 tablet (81 mg total) by mouth daily.   atorvastatin (LIPITOR) 20 MG tablet Take 1 tablet (20 mg total) by mouth daily.   beta carotene w/minerals (OCUVITE) tablet Take 1 tablet by mouth daily.   carvedilol (COREG) 3.125 MG tablet Take 1 tablet (3.125 mg total) by mouth 2 (two) times daily.   Cyanocobalamin (VITAMIN B-12) 5000 MCG TBDP Take 5,000 mcg by mouth daily.   losartan (COZAAR) 100 MG tablet Take 1 tablet by mouth once daily   nitroGLYCERIN (NITROSTAT) 0.4 MG SL tablet Place 1 tablet (0.4 mg total) under the tongue every 5 (five) minutes x 3 doses as needed for chest pain.   spironolactone (ALDACTONE) 25 MG tablet Take 1 tablet by mouth once daily     Allergies:   Bee venom, Betadine [povidone iodine], and Doxycycline   Social History   Socioeconomic History   Marital status: Married    Spouse name: Not on file   Number of children: 3   Years of education: Not on file   Highest education  level: Not on file  Occupational History   Not on file  Tobacco Use   Smoking status: Former    Packs/day: 2.00    Years: 37.00    Pack years: 74.00    Types: Cigarettes    Quit date: 03/11/1983    Years since quitting: 37.6   Smokeless tobacco: Never   Tobacco comments:    started smoking in the service; ICU 85' part of his stomach; appendix; coma   Vaping Use   Vaping Use: Never used  Substance and  Sexual Activity   Alcohol use: Yes    Alcohol/week: 0.0 standard drinks    Comment: RARE   Drug use: No   Sexual activity: Not on file  Other Topics Concern   Not on file  Social History Narrative   [place], [place].   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      Hobbies: fishing, busy volunteering            Social Determinants of Radio broadcast assistant Strain: Low Risk    Difficulty of Paying Living Expenses: Not hard at all  Food Insecurity: No Food Insecurity   Worried About Charity fundraiser in the Last Year: Never true   Arboriculturist in the Last Year: Never true  Transportation Needs: No Transportation Needs   Lack of Transportation (Medical): No   Lack of Transportation (Non-Medical): No  Physical Activity: Sufficiently Active   Days of Exercise per Week: 4 days   Minutes of Exercise per Session: 60 min  Stress: No Stress Concern Present   Feeling of Stress : Not at all  Social Connections: Moderately Integrated   Frequency of Communication with Friends and Family: Once a week   Frequency of Social Gatherings with Friends and Family: Once a week   Attends Religious Services: More than 4 times per year   Active Member of [employer] or Organizations: Yes   Attends Archivist  Meetings: 1 to 4 times per year   Marital Status: Married     Family History: The patient's family history includes Alcohol abuse in his father; Anemia in his sister; Breast cancer in his sister; Cirrhosis in his father; Clotting disorder in his brother; Coronary artery disease in his brother and mother; Heart attack in his brother; Heart failure in his sister; Non-Hodgkin's lymphoma in his brother, brother, and sister. There is no history of Prostate cancer, Colon cancer, Diabetes, Glaucoma, Rectal cancer, or Esophageal cancer.  ROS:   Please see the history of present illness.     All other systems reviewed and are negative.  EKGs/Labs/Other Studies Reviewed:    The following studies were reviewed today:  Echo 11/05/2015 LV EF: 45% -   50%   -------------------------------------------------------------------  Indications:      Cardiomyopathy - ischemic 414.8.   -------------------------------------------------------------------  History:   PMH:   Myocardial infarction.  Risk factors:  Hypertension. Diabetes mellitus. Dyslipidemia.   -------------------------------------------------------------------  Study Conclusions   - Left ventricle: The cavity size was normal. Wall thickness was    increased in a pattern of moderate LVH. Systolic function was    mildly reduced. The estimated ejection fraction was in the range    of 45% to 50%. Inferolateral hypokinesis. Doppler parameters are    consistent with abnormal left ventricular relaxation (grade 1    diastolic dysfunction). The E/e&' ratio is >15, suggesting    elevated LV filling pressure.  - Aortic valve: Trileaflet. Sclerosis without stenosis.  - Mitral valve: Calcified annulus. There was trivial regurgitation.  - Left atrium: The atrium was mildly dilated.  - Inferior vena cava: The vessel was normal in size. The    respirophasic diameter changes  were in the normal range (>= 50%),    consistent with normal central venous  pressure.   Impressions:   - LVEF 45-50% with moderate LVH and inferolateral hypokinesis,    diastolic dysfunction with elevated LV filling pressure, aortic    valve sclerosis, trivial MR, mild LAE, normal IVC.   EKG:  EKG is ordered today.  The ekg ordered today demonstrates bradycardia, heart rate 51 bpm  Recent Labs: 06/25/2020: ALT 7; BUN 28; Creatinine, Ser 1.08; Hemoglobin 12.4; Platelets 244.0; Potassium 4.5; Sodium 141  Recent Lipid Panel    Component Value Date/Time   CHOL 73 12/23/2019 1204   CHOL WILL FOLLOW 05/06/2018 1103   TRIG 57 12/23/2019 1204   HDL 35 (L) 12/23/2019 1204   HDL WILL FOLLOW 05/06/2018 1103   CHOLHDL 2.1 12/23/2019 1204   VLDL 11.2 12/17/2018 1027   LDLCALC 25 12/23/2019 1204   LDLDIRECT 33.0 06/25/2020 1030     Risk Assessment/Calculations:           Physical Exam:    VS:  BP 140/70   Pulse (!) 51   Resp 20   Ht '6\' 1"'$  (1.854 m)   Wt 210 lb (95.3 kg)   BMI 27.71 kg/m     Wt Readings from Last 3 Encounters:  11/07/20 210 lb (95.3 kg)  06/25/20 210 lb 12.8 oz (95.6 kg)  05/14/20 213 lb (96.6 kg)     GEN:  Well nourished, well developed in no acute distress HEENT: Normal NECK: No JVD; No carotid bruits LYMPHATICS: No lymphadenopathy CARDIAC: RRR, no murmurs, rubs, gallops RESPIRATORY:  Clear to auscultation without rales, wheezing or rhonchi  ABDOMEN: Soft, non-tender, non-distended MUSCULOSKELETAL:  No edema; No deformity  SKIN: Warm and dry NEUROLOGIC:  Alert and oriented x 3 PSYCHIATRIC:  Normal affect   ASSESSMENT:    1. Coronary artery disease involving native coronary artery of native heart without angina pectoris   2. Primary hypertension   3. Ischemic cardiomyopathy   4. Hyperlipidemia LDL goal <70    PLAN:    In order of problems listed above:  CAD: Denies any recent chest pain.  Continue aspirin and Lipitor  Hypertension: Blood pressure borderline elevated, however this is very unusual for him.  His  previous blood pressure has always been normal.  I asked him to monitor his blood pressure in the next 2 weeks, if systolic blood pressure is persistently over 135 mmHg, he has been instructed to increase amlodipine to 10 mg  Ischemic cardiomyopathy: Continue carvedilol, losartan and spironolactone  Hyperlipidemia: Continue Lipitor.        Medication Adjustments/Labs and Tests Ordered: Current medicines are reviewed at length with the patient today.  Concerns regarding medicines are outlined above.  Orders Placed This Encounter  Procedures   EKG 12-Lead   No orders of the defined types were placed in this encounter.   Patient Instructions  Medication Instructions:  Your physician recommends that you continue on your current medications as directed. Please refer to the Current Medication list given to you today.  *If you need a refill on your cardiac medications before your next appointment, please call your pharmacy*  Lab Work: NONE ordered at this time of appointment   If you have labs (blood work) drawn today and your tests are completely normal, you will receive your results only by: [place] (if you have MyChart) OR A paper copy in the mail If you have any lab test that is abnormal or we need to  change your treatment, we will call you to review the results.  Testing/Procedures: NONE ordered at this time of appointment   Follow-Up: At [facility], you and your health needs are our priority.  As part of our continuing mission to provide you with exceptional heart care, we have created designated Provider Care Teams.  These Care Teams include your primary Cardiologist (physician) and Advanced Practice Providers (APPs -  Physician Assistants and Nurse Practitioners) who all work together to provide you with the care you need, when you need it.  Your next appointment:   6 month(s)  The format for your next appointment:   In Person  Provider:   Peter Martinique,  MD   Other Instructions MONITOR blood pressure for next 2 weeks at home. If Systolic (top number) Blood Pressure is persistently greater than 135 mmHg on multiple checks. Please give our office a call and also INCREASE Amlodipine to 10 mg (2 tablets) daily   Signed, Almyra Deforest, [place]  11/09/2020 11:54 PM    [place]

## 2020-11-07 NOTE — Patient Instructions (Addendum)
Medication Instructions:  Your physician recommends that you continue on your current medications as directed. Please refer to the Current Medication list given to you today.  *If you need a refill on your cardiac medications before your next appointment, please call your pharmacy*  Lab Work: NONE ordered at this time of appointment   If you have labs (blood work) drawn today and your tests are completely normal, you will receive your results only by: [place] (if you have MyChart) OR A paper copy in the mail If you have any lab test that is abnormal or we need to change your treatment, we will call you to review the results.  Testing/Procedures: NONE ordered at this time of appointment   Follow-Up: At [facility], you and your health needs are our priority.  As part of our continuing mission to provide you with exceptional heart care, we have created designated Provider Care Teams.  These Care Teams include your primary Cardiologist (physician) and Advanced Practice Providers (APPs -  Physician Assistants and Nurse Practitioners) who all work together to provide you with the care you need, when you need it.  Your next appointment:   6 month(s)  The format for your next appointment:   In Person  Provider:   Peter Martinique, MD   Other Instructions MONITOR blood pressure for next 2 weeks at home. If Systolic (top number) Blood Pressure is persistently greater than 135 mmHg on multiple checks. Please give our office a call and also INCREASE Amlodipine to 10 mg (2 tablets) daily

## 2020-11-08 DIAGNOSIS — C8590 Non-Hodgkin lymphoma, unspecified, unspecified site: Secondary | ICD-10-CM

## 2020-11-08 HISTORY — DX: Non-Hodgkin lymphoma, unspecified, unspecified site: C85.90

## 2020-11-09 ENCOUNTER — Encounter: Payer: Self-pay | Admitting: Physician Assistant

## 2020-11-11 ENCOUNTER — Encounter (HOSPITAL_COMMUNITY): Payer: Self-pay

## 2020-11-11 ENCOUNTER — Emergency Department (HOSPITAL_BASED_OUTPATIENT_CLINIC_OR_DEPARTMENT_OTHER)
Admit: 2020-11-11 | Discharge: 2020-11-11 | Disposition: A | Discharge status: Home / Self Care | Payer: Medicare Other | Attending: Emergency Medicine | Admitting: Emergency Medicine

## 2020-11-11 ENCOUNTER — Emergency Department (HOSPITAL_COMMUNITY): Payer: Medicare Other

## 2020-11-11 ENCOUNTER — Other Ambulatory Visit: Payer: Self-pay

## 2020-11-11 ENCOUNTER — Inpatient Hospital Stay (HOSPITAL_COMMUNITY)
Admission: EM | Admit: 2020-11-11 | Discharge: 2020-11-14 | DRG: 389 | Disposition: A | Discharge status: Home Health | Payer: Medicare Other | Attending: Internal Medicine | Admitting: Internal Medicine

## 2020-11-11 DIAGNOSIS — E876 Hypokalemia: Secondary | ICD-10-CM | POA: Diagnosis not present

## 2020-11-11 DIAGNOSIS — L538 Other specified erythematous conditions: Secondary | ICD-10-CM | POA: Diagnosis not present

## 2020-11-11 DIAGNOSIS — K56609 Unspecified intestinal obstruction, unspecified as to partial versus complete obstruction: Secondary | ICD-10-CM

## 2020-11-11 DIAGNOSIS — Z87891 Personal history of nicotine dependence: Secondary | ICD-10-CM | POA: Diagnosis not present

## 2020-11-11 DIAGNOSIS — G629 Polyneuropathy, unspecified: Secondary | ICD-10-CM | POA: Diagnosis present

## 2020-11-11 DIAGNOSIS — Z955 Presence of coronary angioplasty implant and graft: Secondary | ICD-10-CM

## 2020-11-11 DIAGNOSIS — I700 Atherosclerosis of aorta: Secondary | ICD-10-CM | POA: Diagnosis not present

## 2020-11-11 DIAGNOSIS — Z7982 Long term (current) use of aspirin: Secondary | ICD-10-CM

## 2020-11-11 DIAGNOSIS — R911 Solitary pulmonary nodule: Secondary | ICD-10-CM | POA: Diagnosis not present

## 2020-11-11 DIAGNOSIS — I255 Ischemic cardiomyopathy: Secondary | ICD-10-CM | POA: Diagnosis not present

## 2020-11-11 DIAGNOSIS — E892 Postprocedural hypoparathyroidism: Secondary | ICD-10-CM | POA: Diagnosis present

## 2020-11-11 DIAGNOSIS — R110 Nausea: Secondary | ICD-10-CM | POA: Diagnosis not present

## 2020-11-11 DIAGNOSIS — I252 Old myocardial infarction: Secondary | ICD-10-CM | POA: Diagnosis not present

## 2020-11-11 DIAGNOSIS — K56600 Partial intestinal obstruction, unspecified as to cause: Principal | ICD-10-CM | POA: Diagnosis present

## 2020-11-11 DIAGNOSIS — Z85028 Personal history of other malignant neoplasm of stomach: Secondary | ICD-10-CM | POA: Diagnosis present

## 2020-11-11 DIAGNOSIS — R609 Edema, unspecified: Secondary | ICD-10-CM

## 2020-11-11 DIAGNOSIS — I10 Essential (primary) hypertension: Secondary | ICD-10-CM | POA: Diagnosis not present

## 2020-11-11 DIAGNOSIS — I2510 Atherosclerotic heart disease of native coronary artery without angina pectoris: Secondary | ICD-10-CM | POA: Diagnosis not present

## 2020-11-11 DIAGNOSIS — R1110 Vomiting, unspecified: Secondary | ICD-10-CM | POA: Diagnosis not present

## 2020-11-11 DIAGNOSIS — Z8546 Personal history of malignant neoplasm of prostate: Secondary | ICD-10-CM

## 2020-11-11 DIAGNOSIS — R590 Localized enlarged lymph nodes: Secondary | ICD-10-CM | POA: Diagnosis present

## 2020-11-11 DIAGNOSIS — K529 Noninfective gastroenteritis and colitis, unspecified: Secondary | ICD-10-CM | POA: Diagnosis present

## 2020-11-11 DIAGNOSIS — Z888 Allergy status to other drugs, medicaments and biological substances status: Secondary | ICD-10-CM

## 2020-11-11 DIAGNOSIS — C61 Malignant neoplasm of prostate: Secondary | ICD-10-CM | POA: Diagnosis not present

## 2020-11-11 DIAGNOSIS — Z79899 Other long term (current) drug therapy: Secondary | ICD-10-CM

## 2020-11-11 DIAGNOSIS — D638 Anemia in other chronic diseases classified elsewhere: Secondary | ICD-10-CM | POA: Diagnosis not present

## 2020-11-11 DIAGNOSIS — Z8249 Family history of ischemic heart disease and other diseases of the circulatory system: Secondary | ICD-10-CM

## 2020-11-11 DIAGNOSIS — Z6827 Body mass index (BMI) 27.0-27.9, adult: Secondary | ICD-10-CM

## 2020-11-11 DIAGNOSIS — L03114 Cellulitis of left upper limb: Principal | ICD-10-CM | POA: Diagnosis present

## 2020-11-11 DIAGNOSIS — G4733 Obstructive sleep apnea (adult) (pediatric): Secondary | ICD-10-CM | POA: Diagnosis not present

## 2020-11-11 DIAGNOSIS — J9811 Atelectasis: Secondary | ICD-10-CM | POA: Diagnosis not present

## 2020-11-11 DIAGNOSIS — E785 Hyperlipidemia, unspecified: Secondary | ICD-10-CM | POA: Diagnosis present

## 2020-11-11 DIAGNOSIS — Z20822 Contact with and (suspected) exposure to covid-19: Secondary | ICD-10-CM | POA: Diagnosis not present

## 2020-11-11 DIAGNOSIS — Z881 Allergy status to other antibiotic agents status: Secondary | ICD-10-CM

## 2020-11-11 DIAGNOSIS — Z8673 Personal history of transient ischemic attack (TIA), and cerebral infarction without residual deficits: Secondary | ICD-10-CM

## 2020-11-11 DIAGNOSIS — R112 Nausea with vomiting, unspecified: Secondary | ICD-10-CM | POA: Diagnosis not present

## 2020-11-11 DIAGNOSIS — R1033 Periumbilical pain: Secondary | ICD-10-CM | POA: Diagnosis not present

## 2020-11-11 DIAGNOSIS — Z91030 Bee allergy status: Secondary | ICD-10-CM

## 2020-11-11 DIAGNOSIS — J90 Pleural effusion, not elsewhere classified: Secondary | ICD-10-CM | POA: Diagnosis not present

## 2020-11-11 DIAGNOSIS — Z923 Personal history of irradiation: Secondary | ICD-10-CM

## 2020-11-11 DIAGNOSIS — Z9049 Acquired absence of other specified parts of digestive tract: Secondary | ICD-10-CM

## 2020-11-11 DIAGNOSIS — E669 Obesity, unspecified: Secondary | ICD-10-CM | POA: Diagnosis present

## 2020-11-11 DIAGNOSIS — R109 Unspecified abdominal pain: Secondary | ICD-10-CM | POA: Diagnosis not present

## 2020-11-11 DIAGNOSIS — R1084 Generalized abdominal pain: Secondary | ICD-10-CM | POA: Diagnosis not present

## 2020-11-11 DIAGNOSIS — Z8719 Personal history of other diseases of the digestive system: Secondary | ICD-10-CM | POA: Diagnosis present

## 2020-11-11 LAB — CBC
HCT: 43.5 % (ref 39.0–52.0)
Hemoglobin: 14.5 g/dL (ref 13.0–17.0)
MCH: 32.8 pg (ref 26.0–34.0)
MCHC: 33.3 g/dL (ref 30.0–36.0)
MCV: 98.4 fL (ref 80.0–100.0)
Platelets: 254 10*3/uL (ref 150–400)
RBC: 4.42 MIL/uL (ref 4.22–5.81)
RDW: 14.1 % (ref 11.5–15.5)
WBC: 16.8 10*3/uL — ABNORMAL HIGH (ref 4.0–10.5)
nRBC: 0 % (ref 0.0–0.2)

## 2020-11-11 LAB — URINALYSIS, ROUTINE W REFLEX MICROSCOPIC
Bacteria, UA: NONE SEEN
Bilirubin Urine: NEGATIVE
Glucose, UA: NEGATIVE mg/dL
Leukocytes,Ua: NEGATIVE
Nitrite: NEGATIVE
Protein, ur: 30 mg/dL — AB
Specific Gravity, Urine: >1.03 — ABNORMAL HIGH (ref 1.005–1.030)
pH: 5.5 (ref 5.0–8.0)

## 2020-11-11 LAB — COMPREHENSIVE METABOLIC PANEL
ALT: 12 U/L (ref 0–44)
AST: 15 U/L (ref 15–41)
Albumin: 4 g/dL (ref 3.5–5.0)
Alkaline Phosphatase: 73 U/L (ref 38–126)
Anion gap: 11 (ref 5–15)
BUN: 39 mg/dL — ABNORMAL HIGH (ref 8–23)
CO2: 26 mmol/L (ref 22–32)
Calcium: 9.8 mg/dL (ref 8.9–10.3)
Chloride: 104 mmol/L (ref 98–111)
Creatinine, Ser: 1.03 mg/dL (ref 0.61–1.24)
GFR, Estimated: >60 mL/min (ref >60)
Glucose, Bld: 183 mg/dL — ABNORMAL HIGH (ref 70–99)
Potassium: 4.1 mmol/L (ref 3.5–5.1)
Sodium: 141 mmol/L (ref 135–145)
Total Bilirubin: 1.6 mg/dL — ABNORMAL HIGH (ref 0.3–1.2)
Total Protein: 7.4 g/dL (ref 6.5–8.1)

## 2020-11-11 LAB — LACTIC ACID, PLASMA: Lactic Acid, Venous: 1.2 mmol/L (ref 0.5–1.9)

## 2020-11-11 LAB — CK: Total CK: 13 U/L — ABNORMAL LOW (ref 49–397)

## 2020-11-11 LAB — RESP PANEL BY RT-PCR (FLU A&B, COVID) ARPGX2
Influenza A by PCR: NEGATIVE
Influenza B by PCR: NEGATIVE
SARS Coronavirus 2 by RT PCR: NEGATIVE

## 2020-11-11 LAB — LIPASE, BLOOD: Lipase: 28 U/L (ref 11–51)

## 2020-11-11 MED ORDER — SODIUM CHLORIDE 0.9 % IV BOLUS: 1000.0000 mL | Freq: Once | INTRAVENOUS | Status: DC

## 2020-11-11 MED ORDER — MORPHINE SULFATE (PF) 4 MG/ML IV SOLN
4.0000 mg | Freq: Once | INTRAVENOUS | Status: AC
  Administered 2020-11-11: 4 mg via INTRAVENOUS
  Filled 2020-11-11: qty 1

## 2020-11-11 MED ORDER — ONDANSETRON HCL 4 MG/2ML IJ SOLN
4.0000 mg | Freq: Once | INTRAMUSCULAR | Status: AC
  Administered 2020-11-11: 4 mg via INTRAVENOUS
  Filled 2020-11-11: qty 2

## 2020-11-11 MED ORDER — ONDANSETRON HCL 4 MG/2ML IJ SOLN
4.0000 mg | Freq: Four times a day (QID) | INTRAMUSCULAR | Status: DC | PRN
  Administered 2020-11-11 – 2020-11-12 (×3): 4 mg via INTRAVENOUS
  Filled 2020-11-11 (×3): qty 2

## 2020-11-11 MED ORDER — SODIUM CHLORIDE 0.9 % IV SOLN
2.0000 g | Freq: Once | INTRAVENOUS | Status: AC
  Administered 2020-11-11: 2 g via INTRAVENOUS
  Filled 2020-11-11: qty 20

## 2020-11-11 MED ORDER — LACTATED RINGERS IV BOLUS
1000.0000 mL | Freq: Once | INTRAVENOUS | Status: AC
  Administered 2020-11-11: 1000 mL via INTRAVENOUS

## 2020-11-11 MED ORDER — SODIUM CHLORIDE 0.9 % IV SOLN: INTRAVENOUS | Status: DC

## 2020-11-11 MED ORDER — SODIUM CHLORIDE 0.9 % IV SOLN
1.0000 g | INTRAVENOUS | Status: DC
  Administered 2020-11-12 – 2020-11-13 (×2): 1 g via INTRAVENOUS
  Filled 2020-11-11: qty 10
  Filled 2020-11-11: qty 1

## 2020-11-11 MED ORDER — ONDANSETRON HCL 4 MG PO TABS: 4.0000 mg | ORAL_TABLET | Freq: Four times a day (QID) | ORAL | Status: DC | PRN

## 2020-11-11 MED ORDER — IOHEXOL 350 MG/ML SOLN
80.0000 mL | Freq: Once | INTRAVENOUS | Status: AC | PRN
  Administered 2020-11-11: 80 mL via INTRAVENOUS

## 2020-11-11 MED ORDER — MORPHINE SULFATE (PF) 2 MG/ML IV SOLN
2.0000 mg | INTRAVENOUS | Status: DC | PRN
Start: 2020-11-11 — End: 2020-11-14
  Administered 2020-11-12 (×2): 2 mg via INTRAVENOUS
  Filled 2020-11-11 (×2): qty 1

## 2020-11-11 MED ORDER — HYDRALAZINE HCL 20 MG/ML IJ SOLN: 10.0000 mg | Freq: Three times a day (TID) | INTRAMUSCULAR | Status: DC | PRN

## 2020-11-11 NOTE — Consult Note (Addendum)
Jose Ware  [birth year] [id number]  CARE TEAM:  PCP: [name], MD  Outpatient Care Team: Patient Care Team: [name], MD as PCP - General (Family Medicine) Martinique, Peter M, MD as PCP - Cardiology (Cardiology) Lowella Bandy, MD (Inactive) (Urology) Ladene Artist, MD (Gastroenterology) Martinique, Peter M, MD as Consulting Physician (Cardiology) Keene Breath., MD as Consulting Physician (Ophthalmology) Madelin Rear, [facility] as Pharmacist (Pharmacist) Festus Aloe, MD as Consulting Physician (Urology)  Inpatient Treatment Team: Treatment Team: Attending Provider: Jeanell Sparrow, DO; Registered Nurse: Jennell Corner, RN; Technician: Narda Rutherford, [place]; Technician: Aguirre-Carachure, Allie Dimmer, NT; Consulting Physician: Edison Pace, Md, MD   This patient is a 86 y.o.male who presents today for surgical evaluation at the request of Dr Pearline Cables, [facility] ED.   Chief complaint / Reason for evaluation: Abdominal pain with nausea.  Elderly but active obese gentleman with numerous abdominal surgeries.  He recalls part of his stomach being removed he thinks for cancer decades ago.  Also required colon resection with colostomy and colostomy takedown.  Sounds like for benign reasons.  Appendectomy.  Cholecystectomy.  Usually moves his bowels once a day somewhat loose.  Patient noticed some crampy abdominal pain & decreased appetite.  Patient also notes swelling and redness of his left upper arm for the past 48 hours.  Does not recall any specific insect bite although his wife suspects this.  Patient denies much in way of heartburn or reflux.  No sick contacts.  No travel history.  Never had anything at this before.  Based concerns and came to the emergency department.  Concern for possible obstruction by CAT scan just shows dilated small bowel.  No definite transition zone or obstruction.  Surgical consult requested just in case.  Patient has tolerated some crackers and sips but still  has some crampy abdominal pain.  Wife at bedside.   Assessment  Jose Ware  86 y.o. male       Problem List:  Active Problems:   History of prostate cancer   History of stomach cancer   OSA (obstructive sleep apnea)   Crampy abdominal pain and nausea of uncertain etiology.  Distant history of gastric and colon resections with appendectomy as well.  Numerous incisions.  Story raises suspicion for bowel obstruction but CAT scan just shows vaguely dilated small bowel.  No definite transition point as would be expected.  Undergoing p.o. trial.  If tolerates and abdominal pain resolves, can follow-up as needed.  If not better, IV fluids rehydration and follow-up.  Can consult surgery again if needed.  If has worsening pain or emesis then may be nasogastric tube and small bowel protocol.  Try and hold off for now.  Vague possible retroperitoneal lymphadenopathy in a patient with history of prostate cancer with seeds as well as colon resection and gastric sessions in distant past of uncertain etiology.  Will defer to oncology on that.  Left upper extremity edema pain and swelling.  Undergoing duplex examination.  Rule out DVT.  Patient denies but wife wonders if this is an insect bite.    40 minutes spent in review, evaluation, examination, counseling, and coordination of care.  More than 50% of that time was spent in counseling.  Adin Hector, MD, FACS, MASCRS Esophageal, Gastrointestinal & Colorectal Surgery Robotic and Minimally Invasive Surgery  [facility], [place]  [place]. [address], [place], [place] [address] [phone]  Fax [phone] Main  CONTACT INFORMATION:  Weekday (9AM-5PM): Call [facility] main office at [phone]  Weeknight (5PM-9AM) or Weekend/Holiday: Check [email or web address] (password " [email or web address]") for General Surgery [facility] coverage  (Please, do not use SecureChat as it is not reliable  communication to operating surgeons for immediate patient care)      11/11/2020      Past Medical History:  Diagnosis Date   Adenocarcinoma of prostate [facility])    XRT & Radiation seed implantation in 2010   Adenomatous colon polyp    CAD (coronary artery disease)    a. 11/04/15:  Acute inferolateral STEMI: S/p emergent DES of a very large LCx with extensive thrombus. Significant residual disease in the proximal LAD and distal RCA. S/p staged PCI of LAD and RCA 8/29.   Cataract    CVA (cerebral infarction)    a. 123456: embolic CVA after heart cath    CVA (cerebral infarction)    a. 123456: embolic CVA after heart cath    DIVERTICULITIS, HX OF 11/27/2006        DJD (degenerative joint disease)    wrist - R    Hernia    History of blood transfusion 1985   with colon surgery   Hypertension    Ischemic cardiomyopathy    a. EF 25-30% by LV gram on cath 11/04/15. Appeared out of proportion to infarct although infarct was large. EF improved by Echo and now 40-45%.   Myocardial infarction ([place])    NEPHROLITHIASIS, HX OF 11/27/2006         Peripheral neuropathy    Psoriasis    Tenosynovitis     Past Surgical History:  Procedure Laterality Date   [place]   minor disk surgery: instrumentation placed and removed in a second procedure   CARDIAC CATHETERIZATION N/A 11/04/2015   Procedure: Left Heart Cath and Coronary Angiography;  Surgeon: Peter M Martinique, MD;  Location: [place] [facility];  Service: Cardiovascular;  Laterality: N/A;   CARDIAC CATHETERIZATION N/A 11/04/2015   Procedure: Coronary Stent Intervention;  Surgeon: Peter M Martinique, MD;  Location: [place] [facility];  Service: Cardiovascular;  Laterality: N/A;   CARDIAC CATHETERIZATION N/A 11/06/2015   Procedure: Coronary Stent Intervention;  Surgeon: Leonie Man, MD;  Location: [place] [facility];  Service: Cardiovascular;  Laterality: N/A;   CATARACT EXTRACTION, BILATERAL  2013    CHOLECYSTECTOMY  1986   COLON SURGERY  1980's   pt. had ileus, had colon surgery, requiring colostomy & then reversal & then dehisence of that wound & return to OR for repair & cholecystectomy     COLONOSCOPY     COLOSTOMY  1985   After colectomy for diverticulitis, pt. remarks during this surgery they "gave me the paddles two times  because of bleeding", pt. states it was unrelated to any anesthesia complication   EYE SURGERY     /w IOL   KNEE ARTHROSCOPY Left 2003   [place], Right w/cartilage removed later   PARATHYROIDECTOMY N/A 05/12/2014   Procedure: PARATHYROIDECTOMY;  Surgeon: Armandina Gemma, MD;  Location: [place];  Service: General;  Laterality: N/A;   REVERSAL OF COLOSTOMY  1987   TONSILLECTOMY  1946   WRIST SURGERY Left    Remote complicated w/infection    Social History   Socioeconomic History   Marital status: Married    Spouse name: Not on file   Number of children: 3  Years of education: Not on file   Highest education level: Not on file  Occupational History   Not on file  Tobacco Use   Smoking status: Former    Packs/day: 2.00    Years: 37.00    Pack years: 74.00    Types: Cigarettes    Quit date: 03/11/1983    Years since quitting: 37.6   Smokeless tobacco: Never   Tobacco comments:    started smoking in the service; ICU 85' part of his stomach; appendix; coma   Vaping Use   Vaping Use: Never used  Substance and Sexual Activity   Alcohol use: Yes    Alcohol/week: 0.0 standard drinks    Comment: RARE   Drug use: No   Sexual activity: Not on file  Other Topics Concern   Not on file  Social History Narrative   [place], [place].   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      Hobbies: fishing, busy volunteering            Social Determinants of Radio broadcast assistant Strain: Low Risk    Difficulty of Paying Living Expenses: Not hard at all  Food Insecurity: No Food Insecurity   Worried About Charity fundraiser in the Last Year: Never true   Arboriculturist in the Last Year: Never true  Transportation Needs: No Transportation Needs   Lack of Transportation (Medical): No   Lack of Transportation (Non-Medical): No  Physical Activity: Sufficiently Active   Days of Exercise per Week: 4 days   Minutes of Exercise per Session: 60 min  Stress: No Stress Concern Present   Feeling of Stress : Not at all  Social Connections: Moderately Integrated   Frequency of Communication with Friends and Family: Once a week   Frequency of Social Gatherings with Friends and Family: Once a week   Attends Religious Services: More than 4 times per year   Active Member of [employer] or Organizations: Yes   Attends Archivist Meetings: 1 to 4 times per year   Marital Status: Married  Human resources officer Violence: Not At Risk   Fear of Current or Ex-Partner: No   Emotionally Abused: No   Physically Abused: No   Sexually Abused: No    Family History  Problem Relation Age of Onset   Coronary artery disease Mother    Alcohol abuse Father    Cirrhosis Father    Heart failure Sister    Breast cancer Sister        W/involvement of right arm leading to amputation   Coronary artery disease Brother    Heart attack Brother    Non-Hodgkin's lymphoma Brother    Clotting disorder Brother    Non-Hodgkin's lymphoma Brother    Anemia Sister        related to treatment to lymphoma   Non-Hodgkin's lymphoma Sister    Prostate cancer Neg Hx    Colon cancer Neg Hx    Diabetes Neg Hx    Glaucoma Neg Hx    Rectal cancer Neg Hx    Esophageal cancer Neg Hx     No current facility-administered medications  for this encounter.   Current Outpatient Medications   Medication Sig Dispense Refill   acetaminophen (TYLENOL) 325 MG tablet Take 650 mg by mouth as needed.     amLODipine (NORVASC) 5 MG tablet Take 1 tablet by mouth once daily 90 tablet 0   aspirin EC 81 MG tablet Take 1 tablet (81 mg total) by mouth daily. 90 tablet 1   atorvastatin (LIPITOR) 20 MG tablet Take 1 tablet (20 mg total) by mouth daily. 90 tablet 3   beta carotene w/minerals (OCUVITE) tablet Take 1 tablet by mouth daily.     carvedilol (COREG) 3.125 MG tablet Take 1 tablet (3.125 mg total) by mouth 2 (two) times daily. 180 tablet 3   Cyanocobalamin (VITAMIN B-12) 5000 MCG TBDP Take 5,000 mcg by mouth daily.     losartan (COZAAR) 100 MG tablet Take 1 tablet by mouth once daily 90 tablet 3   nitroGLYCERIN (NITROSTAT) 0.4 MG SL tablet Place 1 tablet (0.4 mg total) under the tongue every 5 (five) minutes x 3 doses as needed for chest pain. 25 tablet 2   spironolactone (ALDACTONE) 25 MG tablet Take 1 tablet by mouth once daily 90 tablet 1     Allergies  Allergen Reactions   Bee Venom Swelling    Facial swelling   Betadine [Povidone Iodine] Other (See Comments)    blistering   Doxycycline     Possible drug rash- back of right lower leg and onto left lower leg as well    ROS:   All other systems reviewed & are negative except per HPI or as noted below: Constitutional:  No fevers, chills, sweats.  Weight stable Eyes:  No vision changes, No discharge HENT:  No sore throats, nasal drainage Lymph: No neck swelling, No bruising easily Pulmonary:  No cough, productive sputum CV: No orthopnea, PND  Patient walks 15 minutes without difficulty.  No exertional chest/neck/shoulder/arm pain. GI:  No personal nor family history of inflammatory bowel disease, irritable bowel syndrome, allergy such as Celiac Sprue, dietary/dairy problems, colitis, ulcers nor gastritis.  No recent sick contacts/gastroenteritis.  No travel outside the country.  No changes in diet. Renal: No UTIs, No  hematuria Genital:  No drainage, bleeding, masses Musculoskeletal: No severe joint pain.  Good ROM major joints Skin:  No sores or lesions.  No rashes Heme/Lymph:  No easy bleeding.  No swollen lymph nodes Neuro: No focal weakness/numbness.  No seizures Psych: No suicidal ideation.  No hallucinations  BP (!) 162/92   Pulse 72   Temp 97.9 F (36.6 C) (Oral)   Resp 18   SpO2 95%   Physical Exam:  Constitutional: Not cachectic.  Hygeine adequate.  Vitals signs as above.  Well-developed.  Overweight.  Not toxic nor sickly. Eyes: Pupils reactive, normal extraocular movements. Sclera nonicteric Neuro: CN II-XII intact.  No major focal sensory defects.  No major motor deficits. Lymph: No head/neck/groin lymphadenopathy Psych:  No severe agitation.  No severe anxiety.  Judgment & insight Adequate, Oriented x4, HENT: Normocephalic, Mucus membranes moist.  No thrush.   Neck: Supple, No tracheal deviation.  No obvious thyromegaly Chest: No pain to chest wall compression.  Good respiratory excursion.  No audible wheezing CV:  Pulses intact.   Regular rhythm.  No major extremity edema  Abdomen:  Obese Hernia: Not present. Diastasis recti: Not present. Soft.   Nondistended.   Mild vague discomfort centrally but no peritonitis or guarding.  Midline and left paramedian and other abdominal incisions with scarring  but no definite hernia or diastases.  No pain with cough or bed shake..  No hepatomegaly.  No splenomegaly  Gen:  Inguinal hernia: Not present.  Inguinal lymph nodes: without lymphadenopathy.    Rectal: (Deferred)  Ext: No obvious deformity or contracture.  Edema: Not present.  No cyanosis Skin: No major subcutaneous nodules.  Warm and dry Musculoskeletal: Severe joint rigidity not present.  No obvious clubbing.  No digital petechiae.     Results:   Labs: Results for orders placed or performed during the hospital encounter of 11/11/20 (from the past 48 hour(s))  Lipase, blood      Status: None   Collection Time: 11/11/20  9:25 AM  Result Value Ref Range   Lipase 28 11 - 51 U/L    Comment: Performed at [facility], [place] [address], [place], [place] [address]  Comprehensive metabolic panel     Status: Abnormal   Collection Time: 11/11/20  9:25 AM  Result Value Ref Range   Sodium 141 135 - 145 mmol/L   Potassium 4.1 3.5 - 5.1 mmol/L   Chloride 104 98 - 111 mmol/L   CO2 26 22 - 32 mmol/L   Glucose, Bld 183 (H) 70 - 99 mg/dL    Comment: Glucose reference range applies only to samples taken after fasting for at least 8 hours.   BUN 39 (H) 8 - 23 mg/dL   Creatinine, Ser 1.03 0.61 - 1.24 mg/dL   Calcium 9.8 8.9 - 10.3 mg/dL   Total Protein 7.4 6.5 - 8.1 g/dL   Albumin 4.0 3.5 - 5.0 g/dL   AST 15 15 - 41 U/L   ALT 12 0 - 44 U/L   Alkaline Phosphatase 73 38 - 126 U/L   Total Bilirubin 1.6 (H) 0.3 - 1.2 mg/dL   GFR, Estimated >60 >60 mL/min    Comment: (NOTE) Calculated using the CKD-EPI Creatinine Equation (2021)    Anion gap 11 5 - 15    Comment: Performed at [facility], [place] [address], [place], [place] [address]  CBC     Status: Abnormal   Collection Time: 11/11/20  9:25 AM  Result Value Ref Range   WBC 16.8 (H) 4.0 - 10.5 K/uL   RBC 4.42 4.22 - 5.81 MIL/uL   Hemoglobin 14.5 13.0 - 17.0 g/dL   HCT 43.5 39.0 - 52.0 %   MCV 98.4 80.0 - 100.0 fL   MCH 32.8 26.0 - 34.0 pg   MCHC 33.3 30.0 - 36.0 g/dL   RDW 14.1 11.5 - 15.5 %   Platelets 254 150 - 400 K/uL   nRBC 0.0 0.0 - 0.2 %    Comment: Performed at [facility], [place] [address], [place], [place] [address]  CK     Status: Abnormal   Collection Time: 11/11/20  9:25 AM  Result Value Ref Range   Total CK 13 (L) 49 - 397 U/L    Comment: Performed at [facility], [place] [address] [place], [place] [address]  Urinalysis, Routine w reflex microscopic Urine, Clean Catch     Status: Abnormal   Collection Time: 11/11/20 10:11 AM  Result  Value Ref Range   Color, Urine YELLOW (A) YELLOW   APPearance CLEAR (A) CLEAR   Specific Gravity, Urine >1.030 (H) 1.005 - 1.030   pH 5.5 5.0 - 8.0   Glucose, UA NEGATIVE NEGATIVE mg/dL   Hgb urine dipstick LARGE (A) NEGATIVE   Bilirubin Urine NEGATIVE  NEGATIVE   Ketones, ur TRACE (A) NEGATIVE mg/dL   Protein, ur 30 (A) NEGATIVE mg/dL   Nitrite NEGATIVE NEGATIVE   Leukocytes,Ua NEGATIVE NEGATIVE   RBC / HPF 6-10 0 - 5 RBC/hpf   WBC, UA 0-5 0 - 5 WBC/hpf   Bacteria, UA NONE SEEN NONE SEEN   Squamous Epithelial / LPF 0-5 0 - 5   Mucus PRESENT     Comment: Performed at [facility], [place] [address] [place], [place] [address]  Lactic acid, plasma     Status: None   Collection Time: 11/11/20 11:45 AM  Result Value Ref Range   Lactic Acid, Venous 1.2 0.5 - 1.9 mmol/L    Comment: Performed at [facility], [place] [address] [place], [place] [address]    Imaging / Studies: CT ABDOMEN PELVIS W CONTRAST  Result Date: 11/11/2020 CLINICAL DATA:  RIGHT-side abdominal pain since yesterday, history prostate cancer EXAM: CT ABDOMEN AND PELVIS WITH CONTRAST TECHNIQUE: Multidetector CT imaging of the abdomen and pelvis was performed using the standard protocol following bolus administration of intravenous contrast. CONTRAST:  77m OMNIPAQUE IOHEXOL 350 MG/ML SOLN IV. No oral contrast. COMPARISON:  01/05/2015 FINDINGS: Lower chest: Dependent bibasilar atelectasis Hepatobiliary: Gallbladder surgically absent. Intrahepatic biliary dilatation slightly increased from previous exam. CBD normal caliber without calcification. No focal hepatic mass. Pancreas: Normal appearance Spleen: Normal appearance Adrenals/Urinary Tract: Adrenal glands normal appearance. Multiple renal cysts bilaterally. No hydronephrosis or hydroureter. Bladder unremarkable. Stomach/Bowel: Appendix surgically absent by history. Sigmoid anastomosis. No colonic abnormalities. Distended stomach. Scattered  dilated proximal mid small bowel loops with intervening area of normal caliber. Decompressed distal small bowel loops. Questionable wall thickening of a small bowel loop in the central pelvis versus artifact from underdistention, cannot exclude enteritis. A discrete point of bowel obstruction/transition zone is not identified. Vascular/Lymphatic: Extensive atherosclerotic calcifications aorta, iliac arteries, visceral arteries, coronary arteries. Aorta normal caliber. LEFT para-aortic adenopathy, new, including 27 mm short axis node image 40, 14 mm short axis node image 44, and 15 mm short axis node image 45. Enlarged aortocaval nodes 19 mm image 40 and 21 mm image 33. Additional adenopathy anterior to the aorta and aortocaval region up to 15 mm. Multiple normal to upper normal size mesenteric lymph nodes. Enlarged retrocrural node 15 mm image 19 previously 16 mm. Reproductive: Brachytherapy seed implants within prostate gland/bed. Seminal vesicles unremarkable. Other: Small LEFT inguinal hernia containing fat. No free air or free fluid. Musculoskeletal: Osseous demineralization. Scattered enthesopathy. Mild sclerosis at RIGHT SI joint. Lytic lesion L2 vertebral body unchanged. Arachnoid cyst versus Tarlov cyst S2 sacral canal unchanged. Degenerative disc disease changes lumbar spine with chronic superior endplate compression deformity L1 unchanged. IMPRESSION: Questionable wall thickening of a small bowel loop in the central pelvis versus artifact from underdistention, cannot exclude enteritis. Scattered small bowel loops appear dilated though a segment of mid small bowel and the distal small bowel appear normal in caliber, with no discrete transition zone identified. New retroperitoneal adenopathy most consistent with metastatic disease though this can also be seen with lymphoma. Increase in intrahepatic biliary dilatation post cholecystectomy, recommend correlation with LFTs. Small LEFT inguinal hernia containing  fat. Aortic Atherosclerosis (ICD10-I70.0). Electronically Signed   By: MLavonia DanaM.D.   On: 11/11/2020 12:34   UE Venous Duplex ([facility] and [facility] ONLY)  Result Date: 11/11/2020 UPPER VENOUS STUDY  [facility] Date of Exam:   11/11/2020 Medical Rec #: [id number]  Accession #:    [id number] Date of Birth: Oct 27, 1934           Patient Gender: M Patient Age:   63 years Exam Location:  [facility] Procedure:      VAS [place] UPPER EXTREMITY VENOUS DUPLEX Referring Phys: Wynona Dove --------------------------------------------------------------------------------  Indications: Edema, and Erythema Comparison Study: No prior study Performing Technologist: Maudry Mayhew MHA, RDMS, RVT, RDCS  Examination Guidelines: A complete evaluation includes B-mode imaging, spectral Doppler, color Doppler, and power Doppler as needed of all accessible portions of each vessel. Bilateral testing is considered an integral part of a complete examination. Limited examinations for reoccurring indications may be performed as noted.  Right Findings: +----------+------------+---------+-----------+----------+-------+ RIGHT     CompressiblePhasicitySpontaneousPropertiesSummary +----------+------------+---------+-----------+----------+-------+ Subclavian               Yes       Yes                      +----------+------------+---------+-----------+----------+-------+  Left Findings: +----------+------------+---------+-----------+----------+-------+ LEFT      CompressiblePhasicitySpontaneousPropertiesSummary +----------+------------+---------+-----------+----------+-------+ IJV           Full       Yes       Yes                      +----------+------------+---------+-----------+----------+-------+ Subclavian    Full       Yes       Yes                      +----------+------------+---------+-----------+----------+-------+ Axillary      Full       Yes       Yes                       +----------+------------+---------+-----------+----------+-------+ Brachial      Full       Yes       Yes                      +----------+------------+---------+-----------+----------+-------+ Radial        Full                                          +----------+------------+---------+-----------+----------+-------+ Ulnar         Full                                          +----------+------------+---------+-----------+----------+-------+ Cephalic      Full                                          +----------+------------+---------+-----------+----------+-------+ Basilic       Full                                          +----------+------------+---------+-----------+----------+-------+  Summary:  Right: No evidence of thrombosis in the subclavian.  Left: No evidence of deep vein thrombosis in the upper extremity. No evidence of superficial vein thrombosis in the upper extremity.  *See table(s) above for  measurements and observations.  Diagnosing physician: Deitra Mayo MD Electronically signed by Deitra Mayo MD on 11/11/2020 at 1:54:54 PM.    Final     Medications / Allergies: per chart  Antibiotics: Anti-infectives (From admission, onward)    Start     Dose/Rate Route Frequency Ordered Stop   11/11/20 1130  cefTRIAXone (ROCEPHIN) 2 g in sodium chloride 0.9 % 100 mL IVPB        2 g 200 mL/hr over 30 Minutes Intravenous  Once 11/11/20 1119 11/11/20 1355         Note: Portions of this report may have been transcribed using voice recognition software. Every effort was made to ensure accuracy; however, inadvertent computerized transcription errors may be present.   Any transcriptional errors that result from this process are unintentional.    Adin Hector, MD, FACS, MASCRS Esophageal, Gastrointestinal & Colorectal Surgery Robotic and Minimally Invasive Surgery  [facility], [place]. [address] [place], [place] [address] [phone] Fax [phone] Main  CONTACT INFORMATION:  Weekday (9AM-5PM): Call [facility] main office at [phone]  Weeknight (5PM-9AM) or Weekend/Holiday: Check [email or web address] (password " [email or web address]") for General Surgery [facility] coverage  (Please, do not use SecureChat as it is not reliable communication to operating surgeons for immediate patient care)        11/11/2020  3:19 PM

## 2020-11-11 NOTE — H&P (Signed)
History and Physical    Jose Ware [id number] DOB: Dec 16, 1934 DOA: 11/11/2020  PCP: [name], MD  Patient coming from: Home  Chief Complaint: N/V, ab pain  HPI: Jose Ware is a 86 y.o. male with medical history significant of prostate CA, CVA, HTN. Presenting with N/V, umbilical abdominal pain. Symptoms started yesterday. Stabbing, episodic, non-radiating umbilical pain. When he would have the pain, he would become diaphoretic and have N/V. This continued throughout the night. He has been unable to tolerate PO since. He also complains of LUE swelling after working in the garden a few days ago. He is not sure if he had a bite. However, his arm became itchy and swollen after working in the garden. This worsened over the next several days. He tried benadryl and cortisone. It helped the itching, but not the redness and erythema. He denies any other aggravating or alleviating factors.    ED Course: Clinically concerned for ileus. General surgery consulted. Recommended bowel rest and monitoring. Venous doppler of LUE was negative. He was started on rocephin. TRH was called for admission.   Review of Systems:  Review of systems is otherwise negative for all not mentioned in HPI.   PMHx Past Medical History:  Diagnosis Date   Adenocarcinoma of prostate [facility])    XRT & Radiation seed implantation in 2010   Adenomatous colon polyp    CAD (coronary artery disease)    a. 11/04/15:  Acute inferolateral STEMI: S/p emergent DES of a very large LCx with extensive thrombus. Significant residual disease in the proximal LAD and distal RCA. S/p staged PCI of LAD and RCA 8/29.   Cataract    CVA (cerebral infarction)    a. 123456: embolic CVA after heart cath    CVA (cerebral infarction)    a. 123456: embolic CVA after heart cath    DIVERTICULITIS, HX OF 11/27/2006        DJD (degenerative joint disease)    wrist - R    Hernia    History of blood transfusion 1985   with colon  surgery   Hypertension    Ischemic cardiomyopathy    a. EF 25-30% by LV gram on cath 11/04/15. Appeared out of proportion to infarct although infarct was large. EF improved by Echo and now 40-45%.   Myocardial infarction ([place])    NEPHROLITHIASIS, HX OF 11/27/2006         Peripheral neuropathy    Psoriasis    Tenosynovitis     PSHx Past Surgical History:  Procedure Laterality Date   [place]   minor disk surgery: instrumentation placed and removed in a second procedure   CARDIAC CATHETERIZATION N/A 11/04/2015   Procedure: Left Heart Cath and Coronary Angiography;  Surgeon: Peter M Martinique, MD;  Location: [place] [facility];  Service: Cardiovascular;  Laterality: N/A;   CARDIAC CATHETERIZATION N/A 11/04/2015   Procedure: Coronary Stent Intervention;  Surgeon: Peter M Martinique, MD;  Location: [place] [facility];  Service: Cardiovascular;  Laterality: N/A;   CARDIAC CATHETERIZATION N/A 11/06/2015   Procedure: Coronary Stent Intervention;  Surgeon: Leonie Man, MD;  Location: [place] [facility];  Service: Cardiovascular;  Laterality: N/A;   CATARACT EXTRACTION, BILATERAL  2013   CHOLECYSTECTOMY  1986   COLON SURGERY  1980's   pt. had ileus, had colon surgery, requiring colostomy & then reversal & then dehisence of that wound & return to OR for repair & cholecystectomy  COLONOSCOPY     COLOSTOMY  1985   After colectomy for diverticulitis, pt. remarks during this surgery they "gave me the paddles two times  because of bleeding", pt. states it was unrelated to any anesthesia complication   EYE SURGERY     /w IOL   KNEE ARTHROSCOPY Left 2003   [place]   Left 1956, Right w/cartilage removed later   PARATHYROIDECTOMY N/A 05/12/2014   Procedure: PARATHYROIDECTOMY;  Surgeon: Armandina Gemma, MD;  Location: [place];  Service: General;  Laterality: N/A;   REVERSAL OF COLOSTOMY  1987   TONSILLECTOMY  1946   WRIST SURGERY Left    Remote complicated w/infection     SocHx  reports that he quit smoking about 37 years ago. His smoking use included cigarettes. He has a 74.00 pack-year smoking history. He has never used smokeless tobacco. He reports current alcohol use. He reports that he does not use drugs.  Allergies  Allergen Reactions   Bee Venom Swelling    Facial swelling   Betadine [Povidone Iodine] Other (See Comments)    blistering   Doxycycline     Possible drug rash- back of right lower leg and onto left lower leg as well    FamHx Family History  Problem Relation Age of Onset   Coronary artery disease Mother    Alcohol abuse Father    Cirrhosis Father    Heart failure Sister    Breast cancer Sister        W/involvement of right arm leading to amputation   Coronary artery disease Brother    Heart attack Brother    Non-Hodgkin's lymphoma Brother    Clotting disorder Brother    Non-Hodgkin's lymphoma Brother    Anemia Sister        related to treatment to lymphoma   Non-Hodgkin's lymphoma Sister    Prostate cancer Neg Hx    Colon cancer Neg Hx    Diabetes Neg Hx    Glaucoma Neg Hx    Rectal cancer Neg Hx    Esophageal cancer Neg Hx     Prior to Admission medications   Medication Sig Start Date End Date Taking? Authorizing Provider  acetaminophen (TYLENOL) 325 MG tablet Take 650 mg by mouth as needed.    [provider]  amLODipine (NORVASC) 5 MG tablet Take 1 tablet by mouth once daily 09/11/20   [name], MD  aspirin EC 81 MG tablet Take 1 tablet (81 mg total) by mouth daily. 11/16/15   Martinique, Peter M, MD  atorvastatin (LIPITOR) 20 MG tablet Take 1 tablet (20 mg total) by mouth daily. 12/26/19   [name], MD  beta carotene w/minerals (OCUVITE) tablet Take 1 tablet by mouth daily.    [provider]  carvedilol (COREG) 3.125 MG tablet Take 1 tablet (3.125 mg total) by mouth 2 (two) times daily. 11/28/19   Martinique, Peter M, MD  Cyanocobalamin (VITAMIN B-12) 5000 MCG TBDP Take 5,000 mcg by  mouth daily.    [provider]  losartan (COZAAR) 100 MG tablet Take 1 tablet by mouth once daily 07/23/20   Martinique, Peter M, MD  nitroGLYCERIN (NITROSTAT) 0.4 MG SL tablet Place 1 tablet (0.4 mg total) under the tongue every 5 (five) minutes x 3 doses as needed for chest pain. 06/20/19   [name], MD  spironolactone (ALDACTONE) 25 MG tablet Take 1 tablet by mouth once daily 07/09/20   Martinique, Peter M, MD  Physical Exam: Vitals:   11/11/20 1230 11/11/20 1330 11/11/20 1415 11/11/20 1515  BP: 137/74 (!) 156/77 (!) 158/78 (!) 162/92  Pulse: 69 71 74 72  Resp: '18 18 18 18  '$ Temp:      TempSrc:      SpO2: 91% 93% 95% 95%    General: 86 y.o. male resting in bed in NAD Eyes: PERRL, normal sclera ENMT: Nares patent w/o discharge, orophaynx clear, dentition normal, ears w/o discharge/lesions/ulcers Neck: Supple, trachea midline Cardiovascular: RRR, +S1, S2, no m/g/r, equal pulses throughout Respiratory: CTABL, no w/r/r, normal WOB GI: BS hypoactive, NDNT, no masses noted, no organomegaly noted MSK: No c/c; LUE edema/erythema Skin: No rashes, bruises, ulcerations noted Neuro: A&O x 3, no focal deficits Psyc: Appropriate interaction and affect, calm/cooperative  Labs on Admission: I have personally reviewed following labs and imaging studies  CBC: Recent Labs  Lab 11/11/20 0925  WBC 16.8*  HGB 14.5  HCT 43.5  MCV 98.4  PLT 0000000   Basic Metabolic Panel: Recent Labs  Lab 11/11/20 0925  NA 141  K 4.1  CL 104  CO2 26  GLUCOSE 183*  BUN 39*  CREATININE 1.03  CALCIUM 9.8   GFR: Estimated Creatinine Clearance: 60.3 mL/min (by C-G formula based on SCr of 1.03 mg/dL). Liver Function Tests: Recent Labs  Lab 11/11/20 0925  AST 15  ALT 12  ALKPHOS 73  BILITOT 1.6*  PROT 7.4  ALBUMIN 4.0   Recent Labs  Lab 11/11/20 0925  LIPASE 28   No results for input(s): AMMONIA in the last 168 hours. Coagulation Profile: No results for input(s): INR, PROTIME in the  last 168 hours. Cardiac Enzymes: Recent Labs  Lab 11/11/20 0925  CKTOTAL 13*   BNP (last 3 results) No results for input(s): PROBNP in the last 8760 hours. HbA1C: No results for input(s): HGBA1C in the last 72 hours. CBG: No results for input(s): GLUCAP in the last 168 hours. Lipid Profile: No results for input(s): CHOL, HDL, LDLCALC, TRIG, CHOLHDL, LDLDIRECT in the last 72 hours. Thyroid Function Tests: No results for input(s): TSH, T4TOTAL, FREET4, T3FREE, THYROIDAB in the last 72 hours. Anemia Panel: No results for input(s): VITAMINB12, FOLATE, FERRITIN, TIBC, IRON, RETICCTPCT in the last 72 hours. Urine analysis:    Component Value Date/Time   COLORURINE YELLOW (A) 11/11/2020 1011   APPEARANCEUR CLEAR (A) 11/11/2020 1011   LABSPEC >1.030 (H) 11/11/2020 1011   PHURINE 5.5 11/11/2020 1011   GLUCOSEU NEGATIVE 11/11/2020 1011   GLUCOSEU Negative 09/23/2012 1545   HGBUR LARGE (A) 11/11/2020 1011   BILIRUBINUR NEGATIVE 11/11/2020 1011   BILIRUBINUR n 12/15/2014 0915   KETONESUR TRACE (A) 11/11/2020 1011   PROTEINUR 30 (A) 11/11/2020 1011   UROBILINOGEN 0.2 12/15/2014 0915   UROBILINOGEN 0.2 mg/dL 09/23/2012 1545   NITRITE NEGATIVE 11/11/2020 1011   LEUKOCYTESUR NEGATIVE 11/11/2020 1011    Radiological Exams on Admission: CT ABDOMEN PELVIS W CONTRAST  Result Date: 11/11/2020 CLINICAL DATA:  RIGHT-side abdominal pain since yesterday, history prostate cancer EXAM: CT ABDOMEN AND PELVIS WITH CONTRAST TECHNIQUE: Multidetector CT imaging of the abdomen and pelvis was performed using the standard protocol following bolus administration of intravenous contrast. CONTRAST:  74m OMNIPAQUE IOHEXOL 350 MG/ML SOLN IV. No oral contrast. COMPARISON:  01/05/2015 FINDINGS: Lower chest: Dependent bibasilar atelectasis Hepatobiliary: Gallbladder surgically absent. Intrahepatic biliary dilatation slightly increased from previous exam. CBD normal caliber without calcification. No focal hepatic  mass. Pancreas: Normal appearance Spleen: Normal appearance Adrenals/Urinary Tract: Adrenal glands normal appearance.  Multiple renal cysts bilaterally. No hydronephrosis or hydroureter. Bladder unremarkable. Stomach/Bowel: Appendix surgically absent by history. Sigmoid anastomosis. No colonic abnormalities. Distended stomach. Scattered dilated proximal mid small bowel loops with intervening area of normal caliber. Decompressed distal small bowel loops. Questionable wall thickening of a small bowel loop in the central pelvis versus artifact from underdistention, cannot exclude enteritis. A discrete point of bowel obstruction/transition zone is not identified. Vascular/Lymphatic: Extensive atherosclerotic calcifications aorta, iliac arteries, visceral arteries, coronary arteries. Aorta normal caliber. LEFT para-aortic adenopathy, new, including 27 mm short axis node image 40, 14 mm short axis node image 44, and 15 mm short axis node image 45. Enlarged aortocaval nodes 19 mm image 40 and 21 mm image 33. Additional adenopathy anterior to the aorta and aortocaval region up to 15 mm. Multiple normal to upper normal size mesenteric lymph nodes. Enlarged retrocrural node 15 mm image 19 previously 16 mm. Reproductive: Brachytherapy seed implants within prostate gland/bed. Seminal vesicles unremarkable. Other: Small LEFT inguinal hernia containing fat. No free air or free fluid. Musculoskeletal: Osseous demineralization. Scattered enthesopathy. Mild sclerosis at RIGHT SI joint. Lytic lesion L2 vertebral body unchanged. Arachnoid cyst versus Tarlov cyst S2 sacral canal unchanged. Degenerative disc disease changes lumbar spine with chronic superior endplate compression deformity L1 unchanged. IMPRESSION: Questionable wall thickening of a small bowel loop in the central pelvis versus artifact from underdistention, cannot exclude enteritis. Scattered small bowel loops appear dilated though a segment of mid small bowel and the  distal small bowel appear normal in caliber, with no discrete transition zone identified. New retroperitoneal adenopathy most consistent with metastatic disease though this can also be seen with lymphoma. Increase in intrahepatic biliary dilatation post cholecystectomy, recommend correlation with LFTs. Small LEFT inguinal hernia containing fat. Aortic Atherosclerosis (ICD10-I70.0). Electronically Signed   By: Lavonia Dana M.D.   On: 11/11/2020 12:34   UE Venous Duplex ([facility] and [facility] ONLY)  Result Date: 11/11/2020 UPPER VENOUS STUDY  Patient Name:  Jose Ware [facility]  Date of Exam:   11/11/2020 Medical Rec #: [id number]           Accession #:    [id number] Date of Birth: 1934/05/23           Patient Gender: M Patient Age:   39 years Exam Location:  [facility] Procedure:      VAS [place] UPPER EXTREMITY VENOUS DUPLEX Referring Phys: Wynona Dove --------------------------------------------------------------------------------  Indications: Edema, and Erythema Comparison Study: No prior study Performing Technologist: Maudry Mayhew MHA, RDMS, RVT, RDCS  Examination Guidelines: A complete evaluation includes B-mode imaging, spectral Doppler, color Doppler, and power Doppler as needed of all accessible portions of each vessel. Bilateral testing is considered an integral part of a complete examination. Limited examinations for reoccurring indications may be performed as noted.  Right Findings: +----------+------------+---------+-----------+----------+-------+ RIGHT     CompressiblePhasicitySpontaneousPropertiesSummary +----------+------------+---------+-----------+----------+-------+ Subclavian               Yes       Yes                      +----------+------------+---------+-----------+----------+-------+  Left Findings: +----------+------------+---------+-----------+----------+-------+ LEFT      CompressiblePhasicitySpontaneousPropertiesSummary  +----------+------------+---------+-----------+----------+-------+ IJV           Full       Yes       Yes                      +----------+------------+---------+-----------+----------+-------+ Subclavian    Full  Yes       Yes                      +----------+------------+---------+-----------+----------+-------+ Axillary      Full       Yes       Yes                      +----------+------------+---------+-----------+----------+-------+ Brachial      Full       Yes       Yes                      +----------+------------+---------+-----------+----------+-------+ Radial        Full                                          +----------+------------+---------+-----------+----------+-------+ Ulnar         Full                                          +----------+------------+---------+-----------+----------+-------+ Cephalic      Full                                          +----------+------------+---------+-----------+----------+-------+ Basilic       Full                                          +----------+------------+---------+-----------+----------+-------+  Summary:  Right: No evidence of thrombosis in the subclavian.  Left: No evidence of deep vein thrombosis in the upper extremity. No evidence of superficial vein thrombosis in the upper extremity.  *See table(s) above for measurements and observations.  Diagnosing physician: Deitra Mayo MD Electronically signed by Deitra Mayo MD on 11/11/2020 at 1:54:54 PM.    Final     EKG: None obtained in ED  Assessment/Plan Intractable N/V Abdominal pain Possible ileus    - place in med-surg, obs    - gen surgery onboard, appreciate assistance    - bowel rest for now, fluids, nausea control  LUE cellulitis     - doppler negative     - continue rocephin     - check blood cultures  HTN     - NPO for now     - will have PRN meds onboard  HLD     - resume statin when taking  PO  DVT prophylaxis: SCDs  Code Status: FULL  Family Communication: w/ wife at bedside  Consults called: EDP spoke with general surgery (Dr. Johney Maine)   Status is: Observation  The patient remains OBS appropriate and will d/c before 2 midnights.  Dispo: The patient is from: Home              Anticipated d/c is to: Home              Patient currently is not medically stable to d/c.   Difficult to place patient No  Time spent coordinating admission: 60 minutes  [place] [facility]  If 7PM-7AM, please contact night-coverage [email or web address]  11/11/2020, 3:42 PM

## 2020-11-11 NOTE — ED Triage Notes (Signed)
Pt reports left arm swelling that began yesterday after working outside. Pt also reports sharp abdominal pains that began last night. Denies N/V/D and abdominal pain.

## 2020-11-11 NOTE — ED Provider Notes (Signed)
[facility] DEPT Provider Note   CSN: [id number] Arrival date & time: 11/11/20  [id number]     History Chief Complaint  Patient presents with   Abdominal Pain   Arm Swelling    Jose Ware is a 86 y.o. male.  86 year old male with history as below presented to the ER secondary to abdominal pain, left arm swelling.  Patient notes onset of symptoms over the past 48 hours.  He does report that he was working in the yard prior to onset of symptoms.  Began to experience pain and swelling to his left upper extremity, no perceived wound or injury.  No perceived bite Marilynn Rail.  Following initiation of arm swelling and pain began to experience abdominal pain.  Pain is generalized, no bowel movement in past 24 hours.  Has been feeling nauseated without emesis.  Tolerating reduced oral intake, last p.o. was coffee a few hours ago.  No fevers, chills, chest pain or dyspnea. No Recent medication changes.  No recent travel or sick contacts.  The history is provided by the patient and the spouse. No language interpreter was used.  Abdominal Pain Associated symptoms: constipation, fatigue and nausea   Associated symptoms: no chest pain, no chills, no cough, no fever, no hematuria, no shortness of breath and no vomiting       Past Medical History:  Diagnosis Date   Adenocarcinoma of prostate ([place])    XRT & Radiation seed implantation in 2010   Adenomatous colon polyp    CAD (coronary artery disease)    a. 11/04/15:  Acute inferolateral STEMI: S/p emergent DES of a very large LCx with extensive thrombus. Significant residual disease in the proximal LAD and distal RCA. S/p staged PCI of LAD and RCA 8/29.   Cataract    CVA (cerebral infarction)    a. 123456: embolic CVA after heart cath    CVA (cerebral infarction)    a. 123456: embolic CVA after heart cath    DIVERTICULITIS, HX OF 11/27/2006        DJD (degenerative joint disease)    wrist - R    Hernia    History  of blood transfusion 1985   with colon surgery   Hypertension    Ischemic cardiomyopathy    a. EF 25-30% by LV gram on cath 11/04/15. Appeared out of proportion to infarct although infarct was large. EF improved by Echo and now 40-45%.   Myocardial infarction ([place])    NEPHROLITHIASIS, HX OF 11/27/2006         Peripheral neuropathy    Psoriasis    Tenosynovitis     Patient Active Problem List   Diagnosis Date Noted   Abdominal pain 11/11/2020   Pain in left wrist 05/15/2020   OSA (obstructive sleep apnea) 05/08/2020   Pain of back and left lower extremity 06/08/2019   Left knee pain 06/06/2019   Onychomycosis 10/28/2017   Spondylosis 06/25/2017   Post herpetic neuralgia 06/20/2017   Spinal stenosis of lumbar region at multiple levels 04/30/2017   Drug reaction 02/05/2017   Senile purpura ([place]) 02/05/2017   Dyslipidemia 11/16/2015   History of ventricular tachycardia 11/08/2015   CAD S/P PCI    Ischemic cardiomyopathy    History of embolic stroke    Post-traumatic osteoarthritis of right wrist 04/25/2015   History of adenomatous polyp of colon 01/18/2015   Primary hyperparathyroidism ([place]) 12/28/2013   History of stomach cancer 12/01/2013   Fatigue 12/01/2013   Lower back pain 03/24/2013  Bradycardia 09/23/2012   Bruising 09/23/2012   B12 deficiency 09/17/2010   History of prostate cancer 12/14/2008   PERIPHERAL NEUROPATHY 10/18/2007   PSORIASIS 10/15/2007   Essential hypertension 11/27/2006   DEGENERATIVE JOINT DISEASE 11/27/2006   NEPHROLITHIASIS, HX OF 11/27/2006    Past Surgical History:  Procedure Laterality Date   [place]   minor disk surgery: instrumentation placed and removed in a second procedure   CARDIAC CATHETERIZATION N/A 11/04/2015   Procedure: Left Heart Cath and Coronary Angiography;  Surgeon: Peter M Martinique, MD;  Location: [facility];  Service: Cardiovascular;  Laterality: N/A;   CARDIAC CATHETERIZATION N/A  11/04/2015   Procedure: Coronary Stent Intervention;  Surgeon: Peter M Martinique, MD;  Location: [facility];  Service: Cardiovascular;  Laterality: N/A;   CARDIAC CATHETERIZATION N/A 11/06/2015   Procedure: Coronary Stent Intervention;  Surgeon: Leonie Man, MD;  Location: [facility];  Service: Cardiovascular;  Laterality: N/A;   CATARACT EXTRACTION, BILATERAL  2013   CHOLECYSTECTOMY  1986   COLON SURGERY  1980's   pt. had ileus, had colon surgery, requiring colostomy & then reversal & then dehisence of that wound & return to OR for repair & cholecystectomy     COLONOSCOPY     COLOSTOMY  1985   After colectomy for diverticulitis, pt. remarks during this surgery they "gave me the paddles two times  because of bleeding", pt. states it was unrelated to any anesthesia complication   EYE SURGERY     /w IOL   KNEE ARTHROSCOPY Left 2003   [place], Right w/cartilage removed later   PARATHYROIDECTOMY N/A 05/12/2014   Procedure: PARATHYROIDECTOMY;  Surgeon: Armandina Gemma, MD;  Location: [facility];  Service: General;  Laterality: N/A;   REVERSAL OF [place] Left    Remote complicated w/infection       Family History  Problem Relation Age of Onset   Coronary artery disease Mother    Alcohol abuse Father    Cirrhosis Father    Heart failure Sister    Breast cancer Sister        W/involvement of right arm leading to amputation   Coronary artery disease Brother    Heart attack Brother    Non-Hodgkin's lymphoma Brother    Clotting disorder Brother    Non-Hodgkin's lymphoma Brother    Anemia Sister        related to treatment to lymphoma   Non-Hodgkin's lymphoma Sister    Prostate cancer Neg Hx    Colon cancer Neg Hx    Diabetes Neg Hx    Glaucoma Neg Hx    Rectal cancer Neg Hx    Esophageal cancer Neg Hx     Social History   Tobacco Use   Smoking status: Former    Packs/day: 2.00    Years: 37.00    Pack  years: 74.00    Types: Cigarettes    Quit date: 03/11/1983    Years since quitting: 37.6   Smokeless tobacco: Never   Tobacco comments:    started smoking in the service; ICU 85' part of his stomach; appendix; coma   Vaping Use   Vaping Use: Never used  Substance Use Topics   Alcohol use: Yes    Alcohol/week: 0.0 standard drinks    Comment: RARE   Drug use: No  Home Medications Prior to Admission medications   Medication Sig Start Date End Date Taking? Authorizing Provider  acetaminophen (TYLENOL) 325 MG tablet Take 650 mg by mouth as needed.    [provider]  amLODipine (NORVASC) 5 MG tablet Take 1 tablet by mouth once daily 09/11/20   [name], MD  aspirin EC 81 MG tablet Take 1 tablet (81 mg total) by mouth daily. 11/16/15   Martinique, Peter M, MD  atorvastatin (LIPITOR) 20 MG tablet Take 1 tablet (20 mg total) by mouth daily. 12/26/19   [name], MD  beta carotene w/minerals (OCUVITE) tablet Take 1 tablet by mouth daily.    [provider]  carvedilol (COREG) 3.125 MG tablet Take 1 tablet (3.125 mg total) by mouth 2 (two) times daily. 11/28/19   Martinique, Peter M, MD  Cyanocobalamin (VITAMIN B-12) 5000 MCG TBDP Take 5,000 mcg by mouth daily.    [provider]  losartan (COZAAR) 100 MG tablet Take 1 tablet by mouth once daily 07/23/20   Martinique, Peter M, MD  nitroGLYCERIN (NITROSTAT) 0.4 MG SL tablet Place 1 tablet (0.4 mg total) under the tongue every 5 (five) minutes x 3 doses as needed for chest pain. 06/20/19   [name], MD  spironolactone (ALDACTONE) 25 MG tablet Take 1 tablet by mouth once daily 07/09/20   Martinique, Peter M, MD    Allergies    Bee venom, Betadine [povidone iodine], and Doxycycline  Review of Systems   Review of Systems  Constitutional:  Positive for fatigue. Negative for chills and fever.  HENT:  Negative for facial swelling and trouble swallowing.   Eyes:  Negative for photophobia and visual disturbance.   Respiratory:  Negative for cough and shortness of breath.   Cardiovascular:  Negative for chest pain and palpitations.  Gastrointestinal:  Positive for abdominal pain, constipation and nausea. Negative for vomiting.  Endocrine: Negative for polydipsia and polyuria.  Genitourinary:  Negative for difficulty urinating and hematuria.  Musculoskeletal:  Negative for gait problem and joint swelling.  Skin:  Positive for rash. Negative for pallor.  Neurological:  Negative for syncope and headaches.  Psychiatric/Behavioral:  Negative for agitation and confusion.    Physical Exam Updated Vital Signs BP (!) 162/92   Pulse 72   Temp 97.9 F (36.6 C) (Oral)   Resp 18   SpO2 95%   Physical Exam Vitals and nursing note reviewed.  Constitutional:      General: He is not in acute distress.    Appearance: He is well-developed.  HENT:     Head: Normocephalic and atraumatic.     Right Ear: External ear normal.     Left Ear: External ear normal.     Mouth/Throat:     Mouth: Mucous membranes are moist.  Eyes:     General: No scleral icterus. Cardiovascular:     Rate and Rhythm: Normal rate and regular rhythm.     Pulses: Normal pulses.     Heart sounds: Normal heart sounds.  Pulmonary:     Effort: Pulmonary effort is normal. No respiratory distress.     Breath sounds: Normal breath sounds.  Abdominal:     General: Abdomen is flat.     Palpations: Abdomen is soft.     Tenderness: There is generalized abdominal tenderness.  Musculoskeletal:        General: Normal range of motion.     Cervical back: Normal range of motion.     Right lower leg:  No edema.     Left lower leg: No edema.  Skin:    General: Skin is warm and dry.     Capillary Refill: Capillary refill takes less than 2 seconds.       Neurological:     Mental Status: He is alert and oriented to person, place, and time.  Psychiatric:        Mood and Affect: Mood normal.        Behavior: Behavior normal.    ED Results /  Procedures / Treatments   Labs (all labs ordered are listed, but only abnormal results are displayed) Labs Reviewed  COMPREHENSIVE METABOLIC PANEL - Abnormal; Notable for the following components:      Result Value   Glucose, Bld 183 (*)    BUN 39 (*)    Total Bilirubin 1.6 (*)    All other components within normal limits  CBC - Abnormal; Notable for the following components:   WBC 16.8 (*)    All other components within normal limits  URINALYSIS, ROUTINE W REFLEX MICROSCOPIC - Abnormal; Notable for the following components:   Color, Urine YELLOW (*)    APPearance CLEAR (*)    Specific Gravity, Urine >1.030 (*)    Hgb urine dipstick LARGE (*)    Ketones, ur TRACE (*)    Protein, ur 30 (*)    All other components within normal limits  CK - Abnormal; Notable for the following components:   Total CK 13 (*)    All other components within normal limits  RESP PANEL BY RT-PCR (FLU A&B, COVID) ARPGX2  CULTURE, BLOOD (SINGLE)  LIPASE, BLOOD  LACTIC ACID, PLASMA    EKG None  Radiology CT ABDOMEN PELVIS W CONTRAST  Result Date: 11/11/2020 CLINICAL DATA:  RIGHT-side abdominal pain since yesterday, history prostate cancer EXAM: CT ABDOMEN AND PELVIS WITH CONTRAST TECHNIQUE: Multidetector CT imaging of the abdomen and pelvis was performed using the standard protocol following bolus administration of intravenous contrast. CONTRAST:  41m OMNIPAQUE IOHEXOL 350 MG/ML SOLN IV. No oral contrast. COMPARISON:  01/05/2015 FINDINGS: Lower chest: Dependent bibasilar atelectasis Hepatobiliary: Gallbladder surgically absent. Intrahepatic biliary dilatation slightly increased from previous exam. CBD normal caliber without calcification. No focal hepatic mass. Pancreas: Normal appearance Spleen: Normal appearance Adrenals/Urinary Tract: Adrenal glands normal appearance. Multiple renal cysts bilaterally. No hydronephrosis or hydroureter. Bladder unremarkable. Stomach/Bowel: Appendix surgically absent by  history. Sigmoid anastomosis. No colonic abnormalities. Distended stomach. Scattered dilated proximal mid small bowel loops with intervening area of normal caliber. Decompressed distal small bowel loops. Questionable wall thickening of a small bowel loop in the central pelvis versus artifact from underdistention, cannot exclude enteritis. A discrete point of bowel obstruction/transition zone is not identified. Vascular/Lymphatic: Extensive atherosclerotic calcifications aorta, iliac arteries, visceral arteries, coronary arteries. Aorta normal caliber. LEFT para-aortic adenopathy, new, including 27 mm short axis node image 40, 14 mm short axis node image 44, and 15 mm short axis node image 45. Enlarged aortocaval nodes 19 mm image 40 and 21 mm image 33. Additional adenopathy anterior to the aorta and aortocaval region up to 15 mm. Multiple normal to upper normal size mesenteric lymph nodes. Enlarged retrocrural node 15 mm image 19 previously 16 mm. Reproductive: Brachytherapy seed implants within prostate gland/bed. Seminal vesicles unremarkable. Other: Small LEFT inguinal hernia containing fat. No free air or free fluid. Musculoskeletal: Osseous demineralization. Scattered enthesopathy. Mild sclerosis at RIGHT SI joint. Lytic lesion L2 vertebral body unchanged. Arachnoid cyst versus Tarlov cyst S2 sacral canal unchanged. Degenerative  disc disease changes lumbar spine with chronic superior endplate compression deformity L1 unchanged. IMPRESSION: Questionable wall thickening of a small bowel loop in the central pelvis versus artifact from underdistention, cannot exclude enteritis. Scattered small bowel loops appear dilated though a segment of mid small bowel and the distal small bowel appear normal in caliber, with no discrete transition zone identified. New retroperitoneal adenopathy most consistent with metastatic disease though this can also be seen with lymphoma. Increase in intrahepatic biliary dilatation post  cholecystectomy, recommend correlation with LFTs. Small LEFT inguinal hernia containing fat. Aortic Atherosclerosis (ICD10-I70.0). Electronically Signed   By: Lavonia Dana M.D.   On: 11/11/2020 12:34   UE Venous Duplex ([facility] and [facility] ONLY)  Result Date: 11/11/2020 UPPER VENOUS STUDY  Patient Name:  ABHILASH EMENS [facility]  Date of Exam:   11/11/2020 Medical Rec #: [id number]           Accession #:    [id number] Date of Birth: 15-Mar-1934           Patient Gender: M Patient Age:   89 years Exam Location:  [facility] Procedure:      VAS [place] UPPER EXTREMITY VENOUS DUPLEX Referring Phys: Wynona Dove --------------------------------------------------------------------------------  Indications: Edema, and Erythema Comparison Study: No prior study Performing Technologist: Maudry Mayhew MHA, RDMS, RVT, RDCS  Examination Guidelines: A complete evaluation includes B-mode imaging, spectral Doppler, color Doppler, and power Doppler as needed of all accessible portions of each vessel. Bilateral testing is considered an integral part of a complete examination. Limited examinations for reoccurring indications may be performed as noted.  Right Findings: +----------+------------+---------+-----------+----------+-------+ RIGHT     CompressiblePhasicitySpontaneousPropertiesSummary +----------+------------+---------+-----------+----------+-------+ Subclavian               Yes       Yes                      +----------+------------+---------+-----------+----------+-------+  Left Findings: +----------+------------+---------+-----------+----------+-------+ LEFT      CompressiblePhasicitySpontaneousPropertiesSummary +----------+------------+---------+-----------+----------+-------+ IJV           Full       Yes       Yes                      +----------+------------+---------+-----------+----------+-------+ Subclavian    Full       Yes       Yes                       +----------+------------+---------+-----------+----------+-------+ Axillary      Full       Yes       Yes                      +----------+------------+---------+-----------+----------+-------+ Brachial      Full       Yes       Yes                      +----------+------------+---------+-----------+----------+-------+ Radial        Full                                          +----------+------------+---------+-----------+----------+-------+ Ulnar         Full                                          +----------+------------+---------+-----------+----------+-------+  Cephalic      Full                                          +----------+------------+---------+-----------+----------+-------+ Basilic       Full                                          +----------+------------+---------+-----------+----------+-------+  Summary:  Right: No evidence of thrombosis in the subclavian.  Left: No evidence of deep vein thrombosis in the upper extremity. No evidence of superficial vein thrombosis in the upper extremity.  *See table(s) above for measurements and observations.  Diagnosing physician: Deitra Mayo MD Electronically signed by Deitra Mayo MD on 11/11/2020 at 1:54:54 PM.    Final     Procedures Procedures   Medications Ordered in ED Medications  morphine 4 MG/ML injection 4 mg (4 mg Intravenous Given 11/11/20 1141)  ondansetron (ZOFRAN) injection 4 mg (4 mg Intravenous Given 11/11/20 1141)  lactated ringers bolus 1,000 mL (0 mLs Intravenous Stopped 11/11/20 1355)  cefTRIAXone (ROCEPHIN) 2 g in sodium chloride 0.9 % 100 mL IVPB (0 g Intravenous Stopped 11/11/20 1355)  iohexol (OMNIPAQUE) 350 MG/ML injection 80 mL (80 mLs Intravenous Contrast Given 11/11/20 1204)  morphine 4 MG/ML injection 4 mg (4 mg Intravenous Given 11/11/20 1544)    ED Course  I have reviewed the triage vital signs and the nursing notes.  Pertinent labs & imaging results that were  available during my care of the patient were reviewed by me and considered in my medical decision making (see chart for details).  Clinical Course as of 11/11/20 1803  Nancy Fetter Nov 11, 2020  1425 Lactic acid, plasma [SG]    Clinical Course User Index [SG] Jeanell Sparrow, DO   MDM Rules/Calculators/A&P                           This patient complains of abdominal pain, arm pain; this involves an extensive number of treatment Options and is a complaint that carries with it a high risk of complications and Morbidity. Abdomen is TTP diffusely. Not peritoneal. Concern for cellulitic changes to LUE. Vital signs reviewed. Serious etiologies considered.   Labs reviewed, CBC with leukocytosis. Ketonuria. Pt with reduced PO last 2 days. Concern for mild dehydration. IVF in progress.   Given swelling to LUE obtain duplex, this is negative for acute DVT  CT a/p with iv contrast with concern for possible ileus, also adenopathy (possibly a/w mailgnancy) Pt has history of prostate cancer. D/w general surgery regarding imaging findings, he will come to assess the patient. No acute surgical intervention at this time.  Regarding LUE cellulitis, he has leukocytosis which could be reactive in setting of emesis but given LUE cellulitis will continue with ABX> culture sent prior to abx administration.   D/w family regarding f/u with oncology regarding CT findings. They are agreeable.   Patient unable to tolerate PO despite anti-emetics. At this time recommend admission for for ileus, iv rehydration, iv abx for LUE cellulitis. Pt and family agreeable. D/w [facility] who accepts pt. Gen surg on consult.   Final Clinical Impression(s) / ED Diagnoses Final diagnoses:  Cellulitis of left upper extremity  Lymphadenopathy, abdominal  Enteritis    Rx / DC  Orders ED Discharge Orders     None        Jeanell Sparrow, DO 11/11/20 [id number]

## 2020-11-11 NOTE — Plan of Care (Signed)
  Problem: Health Behavior/Discharge Planning: Goal: Ability to manage health-related needs will improve Outcome: Progressing   Problem: Clinical Measurements: Goal: Ability to maintain clinical measurements within normal limits will improve Outcome: Progressing   

## 2020-11-11 NOTE — ED Notes (Signed)
ED TO INPATIENT HANDOFF REPORT  Name/Age/Gender Jose Ware 86 y.o. male  Code Status Code Status History    Date Active Date Inactive Code Status Order ID Comments User Context   11/04/2015 1743 11/08/2015 1838 Full Code [id number]  [place], Peter M, MD Inpatient   11/04/2015 1545 11/04/2015 1743 Full Code [id number]  [place], Peter M, MD Inpatient   05/12/2014 1239 05/13/2014 1353 Full Code [id number]  Armandina Gemma, MD Inpatient    Questions for Most Recent Historical Code Status (Order [id number])       Home/SNF/Other Home  Chief Complaint Abdominal pain [R10.9]  Level of Care/Admitting Diagnosis ED Disposition    ED Disposition  Admit   Condition  --   [place]: [facility] [[id number]]  Level of Care: Med-Surg [16]  May place patient in observation at [facility] or [place] if equivalent level of care is available:: No  Covid Evaluation: Asymptomatic Screening Protocol (No Symptoms)  Diagnosis: Abdominal pain [id number]  Admitting Physician: Jonnie Finner [id number]  Attending Physician: Jonnie Finner [id number]         Medical History Past Medical History:  Diagnosis Date  . Adenocarcinoma of prostate [facility])    XRT & Radiation seed implantation in 2010  . Adenomatous colon polyp   . CAD (coronary artery disease)    a. 11/04/15:  Acute inferolateral STEMI: S/p emergent DES of a very large LCx with extensive thrombus. Significant residual disease in the proximal LAD and distal RCA. S/p staged PCI of LAD and RCA 8/29.  . Cataract   . CVA (cerebral infarction)    a. 123456: embolic CVA after heart cath   . CVA (cerebral infarction)    a. 123456: embolic CVA after heart cath   . DIVERTICULITIS, HX OF 11/27/2006       . DJD (degenerative joint disease)    wrist - R   . Hernia   . History of blood transfusion 1985   with colon surgery  . Hypertension   . Ischemic cardiomyopathy    a. EF 25-30% by LV gram on cath 11/04/15. Appeared out  of proportion to infarct although infarct was large. EF improved by Echo and now 40-45%.  . Myocardial infarction ([place])   . NEPHROLITHIASIS, HX OF 11/27/2006        . Peripheral neuropathy   . Psoriasis   . Tenosynovitis     Allergies Allergies  Allergen Reactions  . Bee Venom Swelling    Facial swelling  . Betadine [Povidone Iodine] Other (See Comments)    blistering  . Doxycycline     Possible drug rash- back of right lower leg and onto left lower leg as well    IV Location/Drains/Wounds Patient Lines/Drains/Airways Status    Active Line/Drains/Airways    Name Placement date Placement time Site Days   Peripheral IV 11/11/20 20 G Right Antecubital 11/11/20  0928  Antecubital  less than 1   Peripheral IV 11/11/20 20 G Anterior;Right Forearm 11/11/20  1145  Forearm  less than 1   Incision (Closed) 05/12/14 Neck Other (Comment) 05/12/14  0909  -- 2375          Labs/Imaging Results for orders placed or performed during the hospital encounter of 11/11/20 (from the past 48 hour(s))  Lipase, blood     Status: None   Collection Time: 11/11/20  9:25 AM  Result Value Ref Range   Lipase 28 11 - 51 U/L    Comment: Performed  at [facility], [place] [address] [place], [place] [address]  Comprehensive metabolic panel     Status: Abnormal   Collection Time: 11/11/20  9:25 AM  Result Value Ref Range   Sodium 141 135 - 145 mmol/L   Potassium 4.1 3.5 - 5.1 mmol/L   Chloride 104 98 - 111 mmol/L   CO2 26 22 - 32 mmol/L   Glucose, Bld 183 (H) 70 - 99 mg/dL    Comment: Glucose reference range applies only to samples taken after fasting for at least 8 hours.   BUN 39 (H) 8 - 23 mg/dL   Creatinine, Ser 1.03 0.61 - 1.24 mg/dL   Calcium 9.8 8.9 - 10.3 mg/dL   Total Protein 7.4 6.5 - 8.1 g/dL   Albumin 4.0 3.5 - 5.0 g/dL   AST 15 15 - 41 U/L   ALT 12 0 - 44 U/L   Alkaline Phosphatase 73 38 - 126 U/L   Total Bilirubin 1.6 (H) 0.3 - 1.2 mg/dL   GFR, Estimated >60 >60  mL/min    Comment: (NOTE) Calculated using the CKD-EPI Creatinine Equation (2021)    Anion gap 11 5 - 15    Comment: Performed at [facility], [place] [address], [place], [place] [address]  CBC     Status: Abnormal   Collection Time: 11/11/20  9:25 AM  Result Value Ref Range   WBC 16.8 (H) 4.0 - 10.5 K/uL   RBC 4.42 4.22 - 5.81 MIL/uL   Hemoglobin 14.5 13.0 - 17.0 g/dL   HCT 43.5 39.0 - 52.0 %   MCV 98.4 80.0 - 100.0 fL   MCH 32.8 26.0 - 34.0 pg   MCHC 33.3 30.0 - 36.0 g/dL   RDW 14.1 11.5 - 15.5 %   Platelets 254 150 - 400 K/uL   nRBC 0.0 0.0 - 0.2 %    Comment: Performed at [facility], [place] [address], [place], [place] [address]  CK     Status: Abnormal   Collection Time: 11/11/20  9:25 AM  Result Value Ref Range   Total CK 13 (L) 49 - 397 U/L    Comment: Performed at [facility], [place] [address] [place], [place] [address]  Urinalysis, Routine w reflex microscopic Urine, Clean Catch     Status: Abnormal   Collection Time: 11/11/20 10:11 AM  Result Value Ref Range   Color, Urine YELLOW (A) YELLOW   APPearance CLEAR (A) CLEAR   Specific Gravity, Urine >1.030 (H) 1.005 - 1.030   pH 5.5 5.0 - 8.0   Glucose, UA NEGATIVE NEGATIVE mg/dL   Hgb urine dipstick LARGE (A) NEGATIVE   Bilirubin Urine NEGATIVE NEGATIVE   Ketones, ur TRACE (A) NEGATIVE mg/dL   Protein, ur 30 (A) NEGATIVE mg/dL   Nitrite NEGATIVE NEGATIVE   Leukocytes,Ua NEGATIVE NEGATIVE   RBC / HPF 6-10 0 - 5 RBC/hpf   WBC, UA 0-5 0 - 5 WBC/hpf   Bacteria, UA NONE SEEN NONE SEEN   Squamous Epithelial / LPF 0-5 0 - 5   Mucus PRESENT     Comment: Performed at [facility], [place] [address] [place], [place] [address]  Lactic acid, plasma     Status: None   Collection Time: 11/11/20 11:45 AM  Result Value Ref Range   Lactic Acid, Venous 1.2 0.5 - 1.9 mmol/L    Comment: Performed at [facility], [place] [address],  [place], [place] [address]  Resp Panel by RT-PCR (Flu A&B, Covid) Nasopharyngeal Swab     Status: None   Collection Time: 11/11/20  3:50 PM   Specimen: Nasopharyngeal Swab; Nasopharyngeal(NP) swabs in vial transport medium  Result Value Ref Range   SARS Coronavirus 2 by RT PCR NEGATIVE NEGATIVE    Comment: (NOTE) SARS-CoV-2 target nucleic acids are NOT DETECTED.  The SARS-CoV-2 RNA is generally detectable in upper respiratory specimens during the acute phase of infection. The lowest concentration of SARS-CoV-2 viral copies this assay can detect is 138 copies/mL. A negative result does not preclude SARS-Cov-2 infection and should not be used as the sole basis for treatment or other patient management decisions. A negative result may occur with  improper specimen collection/handling, submission of specimen other than nasopharyngeal swab, presence of viral mutation(s) within the areas targeted by this assay, and inadequate number of viral copies(<138 copies/mL). A negative result must be combined with clinical observations, patient history, and epidemiological information. The expected result is Negative.  Fact Sheet for Patients:  EntrepreneurPulse.com.au  Fact Sheet for Healthcare Providers:  IncredibleEmployment.be  This test is no t yet approved or cleared by the [place] FDA and  has been authorized for detection and/or diagnosis of SARS-CoV-2 by FDA under an Emergency Use Authorization (EUA). This EUA will remain  in effect (meaning this test can be used) for the duration of the COVID-19 declaration under Section 564(b)(1) of the Act, 21 U.S.C.section 360bbb-3(b)(1), unless the authorization is terminated  or revoked sooner.       Influenza A by PCR NEGATIVE NEGATIVE   Influenza B by PCR NEGATIVE NEGATIVE    Comment: (NOTE) The Xpert Xpress SARS-CoV-2/FLU/RSV plus assay is intended as an aid in the diagnosis of influenza from  Nasopharyngeal swab specimens and should not be used as a sole basis for treatment. Nasal washings and aspirates are unacceptable for Xpert Xpress SARS-CoV-2/FLU/RSV testing.  Fact Sheet for Patients: EntrepreneurPulse.com.au  Fact Sheet for Healthcare Providers: IncredibleEmployment.be  This test is not yet approved or cleared by the [place] FDA and has been authorized for detection and/or diagnosis of SARS-CoV-2 by FDA under an Emergency Use Authorization (EUA). This EUA will remain in effect (meaning this test can be used) for the duration of the COVID-19 declaration under Section 564(b)(1) of the Act, 21 U.S.C. section 360bbb-3(b)(1), unless the authorization is terminated or revoked.  Performed at [facility], [place] [address], [place], [place] [address]    CT ABDOMEN PELVIS W CONTRAST  Result Date: 11/11/2020 CLINICAL DATA:  RIGHT-side abdominal pain since yesterday, history prostate cancer EXAM: CT ABDOMEN AND PELVIS WITH CONTRAST TECHNIQUE: Multidetector CT imaging of the abdomen and pelvis was performed using the standard protocol following bolus administration of intravenous contrast. CONTRAST:  100m OMNIPAQUE IOHEXOL 350 MG/ML SOLN IV. No oral contrast. COMPARISON:  01/05/2015 FINDINGS: Lower chest: Dependent bibasilar atelectasis Hepatobiliary: Gallbladder surgically absent. Intrahepatic biliary dilatation slightly increased from previous exam. CBD normal caliber without calcification. No focal hepatic mass. Pancreas: Normal appearance Spleen: Normal appearance Adrenals/Urinary Tract: Adrenal glands normal appearance. Multiple renal cysts bilaterally. No hydronephrosis or hydroureter. Bladder unremarkable. Stomach/Bowel: Appendix surgically absent by history. Sigmoid anastomosis. No colonic abnormalities. Distended stomach. Scattered dilated proximal mid small bowel loops with intervening area of normal caliber.  Decompressed distal small bowel loops. Questionable wall thickening of a small bowel loop in the central pelvis versus artifact from underdistention, cannot exclude enteritis. A discrete point of bowel obstruction/transition zone is not identified. Vascular/Lymphatic: Extensive atherosclerotic calcifications  aorta, iliac arteries, visceral arteries, coronary arteries. Aorta normal caliber. LEFT para-aortic adenopathy, new, including 27 mm short axis node image 40, 14 mm short axis node image 44, and 15 mm short axis node image 45. Enlarged aortocaval nodes 19 mm image 40 and 21 mm image 33. Additional adenopathy anterior to the aorta and aortocaval region up to 15 mm. Multiple normal to upper normal size mesenteric lymph nodes. Enlarged retrocrural node 15 mm image 19 previously 16 mm. Reproductive: Brachytherapy seed implants within prostate gland/bed. Seminal vesicles unremarkable. Other: Small LEFT inguinal hernia containing fat. No free air or free fluid. Musculoskeletal: Osseous demineralization. Scattered enthesopathy. Mild sclerosis at RIGHT SI joint. Lytic lesion L2 vertebral body unchanged. Arachnoid cyst versus Tarlov cyst S2 sacral canal unchanged. Degenerative disc disease changes lumbar spine with chronic superior endplate compression deformity L1 unchanged. IMPRESSION: Questionable wall thickening of a small bowel loop in the central pelvis versus artifact from underdistention, cannot exclude enteritis. Scattered small bowel loops appear dilated though a segment of mid small bowel and the distal small bowel appear normal in caliber, with no discrete transition zone identified. New retroperitoneal adenopathy most consistent with metastatic disease though this can also be seen with lymphoma. Increase in intrahepatic biliary dilatation post cholecystectomy, recommend correlation with LFTs. Small LEFT inguinal hernia containing fat. Aortic Atherosclerosis (ICD10-I70.0). Electronically Signed   By: Lavonia Dana M.D.   On: 11/11/2020 12:34   UE Venous Duplex ([facility] and [facility] ONLY)  Result Date: 11/11/2020 UPPER VENOUS STUDY  Patient Name:  MEIR JENTSCH [facility]  Date of Exam:   11/11/2020 Medical Rec #: [id number]           Accession #:    [id number] Date of Birth: 1934/07/20           Patient Gender: M Patient Age:   89 years Exam Location:  [facility] Procedure:      VAS [place] UPPER EXTREMITY VENOUS DUPLEX Referring Phys: Wynona Dove --------------------------------------------------------------------------------  Indications: Edema, and Erythema Comparison Study: No prior study Performing Technologist: Maudry Mayhew MHA, RDMS, RVT, RDCS  Examination Guidelines: A complete evaluation includes B-mode imaging, spectral Doppler, color Doppler, and power Doppler as needed of all accessible portions of each vessel. Bilateral testing is considered an integral part of a complete examination. Limited examinations for reoccurring indications may be performed as noted.  Right Findings: +----------+------------+---------+-----------+----------+-------+ RIGHT     CompressiblePhasicitySpontaneousPropertiesSummary +----------+------------+---------+-----------+----------+-------+ Subclavian               Yes       Yes                      +----------+------------+---------+-----------+----------+-------+  Left Findings: +----------+------------+---------+-----------+----------+-------+ LEFT      CompressiblePhasicitySpontaneousPropertiesSummary +----------+------------+---------+-----------+----------+-------+ IJV           Full       Yes       Yes                      +----------+------------+---------+-----------+----------+-------+ Subclavian    Full       Yes       Yes                      +----------+------------+---------+-----------+----------+-------+ Axillary      Full       Yes       Yes                      +----------+------------+---------+-----------+----------+-------+  Brachial      Full       Yes       Yes                      +----------+------------+---------+-----------+----------+-------+ Radial        Full                                          +----------+------------+---------+-----------+----------+-------+ Ulnar         Full                                          +----------+------------+---------+-----------+----------+-------+ Cephalic      Full                                          +----------+------------+---------+-----------+----------+-------+ Basilic       Full                                          +----------+------------+---------+-----------+----------+-------+  Summary:  Right: No evidence of thrombosis in the subclavian.  Left: No evidence of deep vein thrombosis in the upper extremity. No evidence of superficial vein thrombosis in the upper extremity.  *See table(s) above for measurements and observations.  Diagnosing physician: Deitra Mayo MD Electronically signed by Deitra Mayo MD on 11/11/2020 at 1:54:54 PM.    Final     Pending Labs Unresulted Labs (From admission, onward)    Start     Ordered   11/11/20 1114  Culture, blood (single)  ONCE - STAT,   STAT        11/11/20 1115   Signed and Held  Comprehensive metabolic panel  Tomorrow morning,   R        Signed and Held   Signed and Held  CBC  Tomorrow morning,   R        Signed and Held          Vitals/Pain Today's Vitals   11/11/20 1415 11/11/20 1515 11/11/20 1545 11/11/20 1830  BP: (!) 158/78 (!) 162/92  (!) 150/74  Pulse: 74 72  72  Resp: '18 18  18  '$ Temp:      TempSrc:      SpO2: 95% 95%  92%  PainSc:   9      Isolation Precautions No active isolations  Medications Medications  morphine 4 MG/ML injection 4 mg (4 mg Intravenous Given 11/11/20 1141)  ondansetron (ZOFRAN) injection 4 mg (4 mg Intravenous Given 11/11/20 1141)  lactated ringers bolus 1,000 mL (0 mLs Intravenous Stopped 11/11/20 1355)  cefTRIAXone  (ROCEPHIN) 2 g in sodium chloride 0.9 % 100 mL IVPB (0 g Intravenous Stopped 11/11/20 1355)  iohexol (OMNIPAQUE) 350 MG/ML injection 80 mL (80 mLs Intravenous Contrast Given 11/11/20 1204)  morphine 4 MG/ML injection 4 mg (4 mg Intravenous Given 11/11/20 1544)    Mobility non-ambulatory

## 2020-11-11 NOTE — Progress Notes (Signed)
Left upper extremity venous duplex completed. Refer to "CV Proc" under chart review to view preliminary results.  11/11/2020 1:44 PM Kelby Aline., MHA, RVT, RDCS, RDMS

## 2020-11-12 ENCOUNTER — Observation Stay (HOSPITAL_COMMUNITY): Payer: Medicare Other

## 2020-11-12 DIAGNOSIS — R110 Nausea: Secondary | ICD-10-CM | POA: Diagnosis not present

## 2020-11-12 DIAGNOSIS — Z8546 Personal history of malignant neoplasm of prostate: Secondary | ICD-10-CM | POA: Diagnosis not present

## 2020-11-12 DIAGNOSIS — L03114 Cellulitis of left upper limb: Secondary | ICD-10-CM | POA: Diagnosis not present

## 2020-11-12 DIAGNOSIS — J9811 Atelectasis: Secondary | ICD-10-CM | POA: Diagnosis not present

## 2020-11-12 DIAGNOSIS — J90 Pleural effusion, not elsewhere classified: Secondary | ICD-10-CM | POA: Diagnosis not present

## 2020-11-12 DIAGNOSIS — R911 Solitary pulmonary nodule: Secondary | ICD-10-CM | POA: Diagnosis not present

## 2020-11-12 DIAGNOSIS — I700 Atherosclerosis of aorta: Secondary | ICD-10-CM | POA: Diagnosis not present

## 2020-11-12 DIAGNOSIS — K56609 Unspecified intestinal obstruction, unspecified as to partial versus complete obstruction: Secondary | ICD-10-CM

## 2020-11-12 DIAGNOSIS — R1110 Vomiting, unspecified: Secondary | ICD-10-CM | POA: Diagnosis not present

## 2020-11-12 DIAGNOSIS — R109 Unspecified abdominal pain: Secondary | ICD-10-CM | POA: Diagnosis not present

## 2020-11-12 LAB — COMPREHENSIVE METABOLIC PANEL
ALT: 14 U/L (ref 0–44)
AST: 18 U/L (ref 15–41)
Albumin: 3.5 g/dL (ref 3.5–5.0)
Alkaline Phosphatase: 54 U/L (ref 38–126)
Anion gap: 10 (ref 5–15)
BUN: 45 mg/dL — ABNORMAL HIGH (ref 8–23)
CO2: 26 mmol/L (ref 22–32)
Calcium: 9.4 mg/dL (ref 8.9–10.3)
Chloride: 109 mmol/L (ref 98–111)
Creatinine, Ser: 1.2 mg/dL (ref 0.61–1.24)
GFR, Estimated: 60 mL/min — ABNORMAL LOW (ref >60)
Glucose, Bld: 163 mg/dL — ABNORMAL HIGH (ref 70–99)
Potassium: 3.8 mmol/L (ref 3.5–5.1)
Sodium: 145 mmol/L (ref 135–145)
Total Bilirubin: 1.4 mg/dL — ABNORMAL HIGH (ref 0.3–1.2)
Total Protein: 6.7 g/dL (ref 6.5–8.1)

## 2020-11-12 LAB — LACTATE DEHYDROGENASE: LDH: 111 U/L (ref 98–192)

## 2020-11-12 LAB — CBC
HCT: 43.5 % (ref 39.0–52.0)
Hemoglobin: 14.6 g/dL (ref 13.0–17.0)
MCH: 32.5 pg (ref 26.0–34.0)
MCHC: 33.6 g/dL (ref 30.0–36.0)
MCV: 96.9 fL (ref 80.0–100.0)
Platelets: 235 10*3/uL (ref 150–400)
RBC: 4.49 MIL/uL (ref 4.22–5.81)
RDW: 14.2 % (ref 11.5–15.5)
WBC: 7.7 10*3/uL (ref 4.0–10.5)
nRBC: 0 % (ref 0.0–0.2)

## 2020-11-12 LAB — PSA: Prostatic Specific Antigen: <0.01 ng/mL (ref 0.00–4.00)

## 2020-11-12 MED ORDER — SODIUM CHLORIDE 0.9 % IV SOLN
12.5000 mg | Freq: Once | INTRAVENOUS | Status: AC
  Administered 2020-11-12: 12.5 mg via INTRAVENOUS
  Filled 2020-11-12: qty 12.5

## 2020-11-12 MED ORDER — IOHEXOL 350 MG/ML SOLN
60.0000 mL | Freq: Once | INTRAVENOUS | Status: AC | PRN
  Administered 2020-11-12: 60 mL via INTRAVENOUS

## 2020-11-12 NOTE — Progress Notes (Signed)
Assessment & Plan: HD#2 - nausea, emesis, abdominal pain  - CT without evidence of SBO - will repeat AXR this AM  - continue NPO, IVF  - Rx nausea, emesis per medical service  Hx of prostate Ca - retroperitoneal lymphadenopathy noted on CT - needs work-up by oncology CAD, cardiomyopathy Hx of CVA  Will check on AXR this AM.  Gastroenteritis versus partial SBO.  Will follow.         Armandina Gemma, [facility]       Office: [phone]   Chief Complaint: Nausea, emesis, abd pain  Subjective: Patient in bed, emesis bag on chest.  Mild discomfort.  Objective: Vital signs in last 24 hours: Temp:  [97.9 F (36.6 C)-99.4 F (37.4 C)] 98.4 F (36.9 C) (09/05 0420) Pulse Rate:  [69-79] 72 (09/05 0420) Resp:  [16-24] 24 (09/05 0420) BP: (137-166)/(74-93) 152/81 (09/05 0420) SpO2:  [91 %-96 %] 93 % (09/05 0420) Weight:  [94 kg] 94 kg (09/04 2102) Last BM Date: 11/09/20  Intake/Output from previous day: 09/04 0701 - 09/05 0700 In: 1460.8 [I.V.:310.8; IV Piggyback:1150] Out: 150 [Urine:150] Intake/Output this shift: No intake/output data recorded.  Physical Exam: HEENT - sclerae clear, mucous membranes moist Neck - soft Chest - clear bilaterally Cor - RRR Abdomen - protuberant, BS present; diffuse mild to moderate tenderness with guarding Ext - no edema, non-tender Neuro - alert & oriented, no focal deficits  Lab Results:  Recent Labs    11/11/20 0925 11/12/20 0436  WBC 16.8* 7.7  HGB 14.5 14.6  HCT 43.5 43.5  PLT 254 235   BMET Recent Labs    11/11/20 0925 11/12/20 0436  NA 141 145  K 4.1 3.8  CL 104 109  CO2 26 26  GLUCOSE 183* 163*  BUN 39* 45*  CREATININE 1.03 1.20  CALCIUM 9.8 9.4   PT/INR No results for input(s): LABPROT, INR in the last 72 hours. Comprehensive Metabolic Panel:    Component Value Date/Time   NA 145 11/12/2020 0436   NA 141 11/11/2020 0925   NA 140 05/06/2018 1103   K 3.8 11/12/2020 0436   K 4.1  11/11/2020 0925   CL 109 11/12/2020 0436   CL 104 11/11/2020 0925   CO2 26 11/12/2020 0436   CO2 26 11/11/2020 0925   BUN 45 (H) 11/12/2020 0436   BUN 39 (H) 11/11/2020 0925   BUN 27 05/06/2018 1103   CREATININE 1.20 11/12/2020 0436   CREATININE 1.03 11/11/2020 0925   CREATININE 1.10 12/23/2019 1204   CREATININE 1.03 02/20/2016 0907   GLUCOSE 163 (H) 11/12/2020 0436   GLUCOSE 183 (H) 11/11/2020 0925   CALCIUM 9.4 11/12/2020 0436   CALCIUM 9.8 11/11/2020 0925   AST 18 11/12/2020 0436   AST 15 11/11/2020 0925   ALT 14 11/12/2020 0436   ALT 12 11/11/2020 0925   ALKPHOS 54 11/12/2020 0436   ALKPHOS 73 11/11/2020 0925   BILITOT 1.4 (H) 11/12/2020 0436   BILITOT 1.6 (H) 11/11/2020 0925   BILITOT 1.2 05/06/2018 1103   PROT 6.7 11/12/2020 0436   PROT 7.4 11/11/2020 0925   PROT 6.1 05/06/2018 1103   ALBUMIN 3.5 11/12/2020 0436   ALBUMIN 4.0 11/11/2020 0925   ALBUMIN 3.9 05/06/2018 1103    Studies/Results: CT ABDOMEN PELVIS W CONTRAST  Result Date: 11/11/2020 CLINICAL DATA:  RIGHT-side abdominal pain since yesterday, history prostate cancer EXAM: CT ABDOMEN AND PELVIS WITH CONTRAST TECHNIQUE: Multidetector CT  imaging of the abdomen and pelvis was performed using the standard protocol following bolus administration of intravenous contrast. CONTRAST:  88m OMNIPAQUE IOHEXOL 350 MG/ML SOLN IV. No oral contrast. COMPARISON:  01/05/2015 FINDINGS: Lower chest: Dependent bibasilar atelectasis Hepatobiliary: Gallbladder surgically absent. Intrahepatic biliary dilatation slightly increased from previous exam. CBD normal caliber without calcification. No focal hepatic mass. Pancreas: Normal appearance Spleen: Normal appearance Adrenals/Urinary Tract: Adrenal glands normal appearance. Multiple renal cysts bilaterally. No hydronephrosis or hydroureter. Bladder unremarkable. Stomach/Bowel: Appendix surgically absent by history. Sigmoid anastomosis. No colonic abnormalities. Distended stomach. Scattered  dilated proximal mid small bowel loops with intervening area of normal caliber. Decompressed distal small bowel loops. Questionable wall thickening of a small bowel loop in the central pelvis versus artifact from underdistention, cannot exclude enteritis. A discrete point of bowel obstruction/transition zone is not identified. Vascular/Lymphatic: Extensive atherosclerotic calcifications aorta, iliac arteries, visceral arteries, coronary arteries. Aorta normal caliber. LEFT para-aortic adenopathy, new, including 27 mm short axis node image 40, 14 mm short axis node image 44, and 15 mm short axis node image 45. Enlarged aortocaval nodes 19 mm image 40 and 21 mm image 33. Additional adenopathy anterior to the aorta and aortocaval region up to 15 mm. Multiple normal to upper normal size mesenteric lymph nodes. Enlarged retrocrural node 15 mm image 19 previously 16 mm. Reproductive: Brachytherapy seed implants within prostate gland/bed. Seminal vesicles unremarkable. Other: Small LEFT inguinal hernia containing fat. No free air or free fluid. Musculoskeletal: Osseous demineralization. Scattered enthesopathy. Mild sclerosis at RIGHT SI joint. Lytic lesion L2 vertebral body unchanged. Arachnoid cyst versus Tarlov cyst S2 sacral canal unchanged. Degenerative disc disease changes lumbar spine with chronic superior endplate compression deformity L1 unchanged. IMPRESSION: Questionable wall thickening of a small bowel loop in the central pelvis versus artifact from underdistention, cannot exclude enteritis. Scattered small bowel loops appear dilated though a segment of mid small bowel and the distal small bowel appear normal in caliber, with no discrete transition zone identified. New retroperitoneal adenopathy most consistent with metastatic disease though this can also be seen with lymphoma. Increase in intrahepatic biliary dilatation post cholecystectomy, recommend correlation with LFTs. Small LEFT inguinal hernia containing  fat. Aortic Atherosclerosis (ICD10-I70.0). Electronically Signed   By: MLavonia DanaM.D.   On: 11/11/2020 12:34   UE Venous Duplex ([facility] and [facility] ONLY)  Result Date: 11/11/2020 UPPER VENOUS STUDY  [facility] Date of [facility]am:   11/11/2020 Medical Rec #: [id number]          Accession #:    [id number] of Birth: [birth year]          Patient Gender: M Patient Age:   89years [facility]      VAS [place]PPER EXTREMITY VENOUS DUPLEX Referring Phys: SWynona Dove--------------------------------------------------------------------------------  Indications: Edema, and Erythema Comparison Study: No prior study Performing Technologist: MMaudry MayhewMHA, RDMS, RVT, RDCS  Examination Guidelines: A complete evaluation includes B-mode imaging, spectral Doppler, color Doppler, and power Doppler as needed of all accessible portions of each vessel. Bilateral testing is considered an integral part of a complete examination. Limited examinations for reoccurring indications may be performed as noted.  Right Findings: +----------+------------+---------+-----------+----------+-------+ RIGHT     CompressiblePhasicitySpontaneousPropertiesSummary +----------+------------+---------+-----------+----------+-------+ Subclavian               Yes       Yes                      +----------+------------+---------+-----------+----------+-------+  Left Findings: +----------+------------+---------+-----------+----------+-------+ LEFT      CompressiblePhasicitySpontaneousPropertiesSummary +----------+------------+---------+-----------+----------+-------+ IJV           Full       Yes       Yes                      +----------+------------+---------+-----------+----------+-------+ Subclavian    Full       Yes       Yes                      +----------+------------+---------+-----------+----------+-------+ Axillary      Full       Yes       Yes                       +----------+------------+---------+-----------+----------+-------+ Brachial      Full       Yes       Yes                      +----------+------------+---------+-----------+----------+-------+ Radial        Full                                          +----------+------------+---------+-----------+----------+-------+ Ulnar         Full                                          +----------+------------+---------+-----------+----------+-------+ Cephalic      Full                                          +----------+------------+---------+-----------+----------+-------+ Basilic       Full                                          +----------+------------+---------+-----------+----------+-------+  Summary:  Right: No evidence of thrombosis in the subclavian.  Left: No evidence of deep vein thrombosis in the upper extremity. No evidence of superficial vein thrombosis in the upper extremity.  *See table(s) above for measurements and observations.  Diagnosing physician: Deitra Mayo MD Electronically signed by Deitra Mayo MD on 11/11/2020 at 1:54:54 PM.    Final       Armandina Gemma 11/12/2020   Patient ID: [name], male   DOB: 02/28/1935, 86 y.o.   MRN: [id number]

## 2020-11-12 NOTE — Progress Notes (Signed)
Mobility Specialist - Progress Note    11/12/20 1138  Mobility  Activity Ambulated in hall  Level of Assistance Contact guard assist, steadying assist  [place] wheel walker  Distance Ambulated (ft) 120 ft  Mobility Ambulated with assistance in hallway  Mobility Response Tolerated well  Mobility performed by Mobility specialist  $Mobility charge 1 Mobility    Upon entry pt was agreeable to ambulate and used RW to walk ~120 ft in hallway. Pt reported feeling dizzy at beginning of session and stated it faded away while ambulating. Verbal cues utilized to keep pt walking inside of RW and to practice pursed breathing. No other complaints were made during session. Pt was returned to bed after ambulating and left with call bell at side.   [place] Specialist [facility] Phone: [phone] 11/12/20, 11:41 AM

## 2020-11-12 NOTE — Progress Notes (Addendum)
[facility]  PROGRESS NOTE  Jose Ware [id number] DOB: 1934-10-10 DOA: 11/11/2020 PCP: [name], MD   Brief HPI:   86 year old male with history of prostate cancer, CVA, hypertension presented with nausea vomiting, umbilical abdominal pain.  Symptoms started on Saturday.  Patient says the pain is stabbing, episodic, nonradiating umbilical pain.  This was associated with diaphoresis, nausea and vomiting. In the ED CT scan abdomen/pelvis was concerning for wall thickening of small bowel loops in the central pelvis, cannot exclude enteritis.  Also showed dilated segment of mid small bowel .  Abdominal x-ray this morning showed partial small bowel obstruction.  General surgery has been consulted.   Subjective   Patient seen and examined, continues to have abdominal distention, not passing flatus.  Also having intermittent abdominal pain   Assessment/Plan:     Partial small bowel obstruction versus ileus -Abdominal x-ray done this morning confirms partial SBO -General surgery is following -Patient is n.p.o., continue IV fluids, as needed morphine for pain, Zofran as needed for nausea and vomiting  Retroperitoneal lymphadenopathy -Seen on CT abdomen/pelvis -Patient has history of prostate cancer -Called and discussed with oncologist Dr. Chryl Heck, she recommended to obtain CT chest -CT chest showed no primary pregnancy -We will obtain PSA, LDH -Oncology to see in a.m.  Left upper extremity cellulitis -Venous duplex of upper left extremity is negative -Patient started empirically on Rocephin  Hypertension -Oral meds on hold as patient is n.p.o. -Blood pressure is fairly well controlled, continue as needed IV hydralazine   Scheduled medications:         Data Reviewed:   CBG:  No results for input(s): GLUCAP in the last 168 hours.  SpO2: 91 %    Vitals:   11/12/20 0044 11/12/20 0420 11/12/20 0934 11/12/20 1244  BP: (!) 145/82 (!) 152/81 (!)  152/78 (!) 146/82  Pulse: 76 72 75 72  Resp: 20 (!) '24 17 18  '$ Temp: 98.6 F (37 C) 98.4 F (36.9 C) 98.3 F (36.8 C) 98.3 F (36.8 C)  TempSrc: Oral Oral Oral Oral  SpO2: 93% 93% 93% 91%  Weight:      Height:         Intake/Output Summary (Last 24 hours) at 11/12/2020 1848 Last data filed at 11/12/2020 1700 Gross per 24 hour  Intake 1596.83 ml  Output 850 ml  Net 746.83 ml    09/03 1901 - 09/05 0700 In: 1460.8 [I.V.:310.8] Out: 150 [Urine:150]  Filed Weights   11/11/20 2102  Weight: 94 kg    CBC:  Recent Labs  Lab 11/11/20 0925 11/12/20 0436  WBC 16.8* 7.7  HGB 14.5 14.6  HCT 43.5 43.5  PLT 254 235  MCV 98.4 96.9  MCH 32.8 32.5  MCHC 33.3 33.6  RDW 14.1 14.2    Complete metabolic panel:  Recent Labs  Lab 11/11/20 0925 11/11/20 1145 11/12/20 0436  NA 141  --  145  K 4.1  --  3.8  CL 104  --  109  CO2 26  --  26  GLUCOSE 183*  --  163*  BUN 39*  --  45*  CREATININE 1.03  --  1.20  CALCIUM 9.8  --  9.4  AST 15  --  18  ALT 12  --  14  ALKPHOS 73  --  54  BILITOT 1.6*  --  1.4*  ALBUMIN 4.0  --  3.5  LATICACIDVEN  --  1.2  --     Recent Labs  Lab 11/11/20 0925  LIPASE 28    Recent Labs  Lab 11/11/20 1550  SARSCOV2NAA NEGATIVE    ------------------------------------------------------------------------------------------------------------------ No results for input(s): CHOL, HDL, LDLCALC, TRIG, CHOLHDL, LDLDIRECT in the last 72 hours.  Lab Results  Component Value Date   HGBA1C 5.3 11/04/2015   ------------------------------------------------------------------------------------------------------------------ No results for input(s): TSH, T4TOTAL, T3FREE, THYROIDAB in the last 72 hours.  Invalid input(s): FREET3 ------------------------------------------------------------------------------------------------------------------ No results for input(s): VITAMINB12, FOLATE, FERRITIN, TIBC, IRON, RETICCTPCT in the last 72  hours.  Coagulation profile No results for input(s): INR, PROTIME in the last 168 hours. No results for input(s): DDIMER in the last 72 hours.  Cardiac Enzymes Recent Labs  Lab 11/11/20 0925  CKTOTAL 13*    ------------------------------------------------------------------------------------------------------------------ No results found for: BNP   Antibiotics: Anti-infectives (From admission, onward)    Start     Dose/Rate Route Frequency Ordered Stop   11/12/20 1000  cefTRIAXone (ROCEPHIN) 1 g in sodium chloride 0.9 % 100 mL IVPB        1 g 200 mL/hr over 30 Minutes Intravenous Every 24 hours 11/11/20 2047     11/11/20 1130  cefTRIAXone (ROCEPHIN) 2 g in sodium chloride 0.9 % 100 mL IVPB        2 g 200 mL/hr over 30 Minutes Intravenous  Once 11/11/20 1119 11/11/20 1355        Radiology Reports  CT CHEST W CONTRAST  Result Date: 11/12/2020 CLINICAL DATA:  Retroperitoneal adenopathy suspicious for metastatic disease. Cancer of unknown primary. Staging. EXAM: CT CHEST WITH CONTRAST TECHNIQUE: Multidetector CT imaging of the chest was performed during intravenous contrast administration. CONTRAST:  39m OMNIPAQUE IOHEXOL 350 MG/ML SOLN COMPARISON:  One-view chest x-ray 11/04/2015 FINDINGS: Cardiovascular: Coronary artery calcifications are present. Heart size is normal. Atherosclerotic calcifications are present in the aorta and branch vessel origins without aneurysm or stenosis. Pulmonary artery there is a pulmonary arteries are within normal limits. Mediastinum/Nodes: No significant mediastinal, hilar, or axillary adenopathy is present. Heterogeneous thyroid present without dominant nodule. Not clinically significant; no follow-up imaging recommendedesophagus is unremarkable. Lungs/Pleura: Small peripheral high density nodules are present in the right upper thyroid best seen on image 53 of image 5. No other focal nodules are present. Small effusions are present, left greater than  right. Associated dependent atelectasis is noted. Airways are patent. Upper Abdomen: Stomach is distended as before. Renal cysts are partially visualized. Cholecystectomy noted. Musculoskeletal: Vertebral body heights and alignment are normal. IMPRESSION: 1. No evidence for primary malignancy in the chest. 2. Coronary artery disease. 3. Small peripheral high density nodules in the right upper lobe are likely post infectious. No follow-up recommended. 4. Aortic Atherosclerosis (ICD10-I70.0). Electronically Signed   By: CSan MorelleM.D.   On: 11/12/2020 14:31   CT ABDOMEN PELVIS W CONTRAST  Result Date: 11/11/2020 CLINICAL DATA:  RIGHT-side abdominal pain since yesterday, history prostate cancer EXAM: CT ABDOMEN AND PELVIS WITH CONTRAST TECHNIQUE: Multidetector CT imaging of the abdomen and pelvis was performed using the standard protocol following bolus administration of intravenous contrast. CONTRAST:  892mOMNIPAQUE IOHEXOL 350 MG/ML SOLN IV. No oral contrast. COMPARISON:  01/05/2015 FINDINGS: Lower chest: Dependent bibasilar atelectasis Hepatobiliary: Gallbladder surgically absent. Intrahepatic biliary dilatation slightly increased from previous exam. CBD normal caliber without calcification. No focal hepatic mass. Pancreas: Normal appearance Spleen: Normal appearance Adrenals/Urinary Tract: Adrenal glands normal appearance. Multiple renal cysts bilaterally. No hydronephrosis or hydroureter. Bladder unremarkable. Stomach/Bowel: Appendix surgically absent by history. Sigmoid anastomosis. No colonic abnormalities. Distended stomach. Scattered dilated proximal  mid small bowel loops with intervening area of normal caliber. Decompressed distal small bowel loops. Questionable wall thickening of a small bowel loop in the central pelvis versus artifact from underdistention, cannot exclude enteritis. A discrete point of bowel obstruction/transition zone is not identified. Vascular/Lymphatic: Extensive  atherosclerotic calcifications aorta, iliac arteries, visceral arteries, coronary arteries. Aorta normal caliber. LEFT para-aortic adenopathy, new, including 27 mm short axis node image 40, 14 mm short axis node image 44, and 15 mm short axis node image 45. Enlarged aortocaval nodes 19 mm image 40 and 21 mm image 33. Additional adenopathy anterior to the aorta and aortocaval region up to 15 mm. Multiple normal to upper normal size mesenteric lymph nodes. Enlarged retrocrural node 15 mm image 19 previously 16 mm. Reproductive: Brachytherapy seed implants within prostate gland/bed. Seminal vesicles unremarkable. Other: Small LEFT inguinal hernia containing fat. No free air or free fluid. Musculoskeletal: Osseous demineralization. Scattered enthesopathy. Mild sclerosis at RIGHT SI joint. Lytic lesion L2 vertebral body unchanged. Arachnoid cyst versus Tarlov cyst S2 sacral canal unchanged. Degenerative disc disease changes lumbar spine with chronic superior endplate compression deformity L1 unchanged. IMPRESSION: Questionable wall thickening of a small bowel loop in the central pelvis versus artifact from underdistention, cannot exclude enteritis. Scattered small bowel loops appear dilated though a segment of mid small bowel and the distal small bowel appear normal in caliber, with no discrete transition zone identified. New retroperitoneal adenopathy most consistent with metastatic disease though this can also be seen with lymphoma. Increase in intrahepatic biliary dilatation post cholecystectomy, recommend correlation with LFTs. Small LEFT inguinal hernia containing fat. Aortic Atherosclerosis (ICD10-I70.0). Electronically Signed   By: Lavonia Dana M.D.   On: 11/11/2020 12:34   DG Abd Portable 1V  Result Date: 11/12/2020 CLINICAL DATA:  Abdominal pain with nausea and vomiting EXAM: PORTABLE ABDOMEN - 1 VIEW COMPARISON:  CT from yesterday FINDINGS: Continued gas dilated small bowel loops measuring up to 4 cm in  diameter. Moderate gaseous distention of the stomach. Similar degree of colonic gas and stool. Postoperative abdomen with pelvic bowel sutures. Prostate brachytherapy seeds. IMPRESSION: Partial small bowel obstruction. No significant change from yesterday. Electronically Signed   By: Monte Fantasia M.D.   On: 11/12/2020 08:35   UE Venous Duplex ([facility] and [facility] ONLY)  Result Date: 11/11/2020 UPPER VENOUS STUDY  Patient Name:  OVE AMAT [facility]  Date of Exam:   11/11/2020 Medical Rec #: [id number]           Accession #:    [id number] Date of Birth: 27-Mar-1934           Patient Gender: M Patient Age:   32 years Exam Location:  [facility] Procedure:      VAS [place] UPPER EXTREMITY VENOUS DUPLEX Referring Phys: Wynona Dove --------------------------------------------------------------------------------  Indications: Edema, and Erythema Comparison Study: No prior study Performing Technologist: Maudry Mayhew MHA, RDMS, RVT, RDCS  Examination Guidelines: A complete evaluation includes B-mode imaging, spectral Doppler, color Doppler, and power Doppler as needed of all accessible portions of each vessel. Bilateral testing is considered an integral part of a complete examination. Limited examinations for reoccurring indications may be performed as noted.  Right Findings: +----------+------------+---------+-----------+----------+-------+ RIGHT     CompressiblePhasicitySpontaneousPropertiesSummary +----------+------------+---------+-----------+----------+-------+ Subclavian               Yes       Yes                      +----------+------------+---------+-----------+----------+-------+  Left  Findings: +----------+------------+---------+-----------+----------+-------+ LEFT      CompressiblePhasicitySpontaneousPropertiesSummary +----------+------------+---------+-----------+----------+-------+ IJV           Full       Yes       Yes                       +----------+------------+---------+-----------+----------+-------+ Subclavian    Full       Yes       Yes                      +----------+------------+---------+-----------+----------+-------+ Axillary      Full       Yes       Yes                      +----------+------------+---------+-----------+----------+-------+ Brachial      Full       Yes       Yes                      +----------+------------+---------+-----------+----------+-------+ Radial        Full                                          +----------+------------+---------+-----------+----------+-------+ Ulnar         Full                                          +----------+------------+---------+-----------+----------+-------+ Cephalic      Full                                          +----------+------------+---------+-----------+----------+-------+ Basilic       Full                                          +----------+------------+---------+-----------+----------+-------+  Summary:  Right: No evidence of thrombosis in the subclavian.  Left: No evidence of deep vein thrombosis in the upper extremity. No evidence of superficial vein thrombosis in the upper extremity.  *See table(s) above for measurements and observations.  Diagnosing physician: Deitra Mayo MD Electronically signed by Deitra Mayo MD on 11/11/2020 at 1:54:54 PM.    Final       DVT prophylaxis: SCDs  Code Status: Full code  Family Communication: Discussed with patient's son and daughter-in-law at bedside   Consultants: General surgery  Procedures:     Objective    Physical Examination:   General-appears in no acute distress Heart-S1-S2, regular, no murmur auscultated Lungs-clear to auscultation bilaterally, no wheezing or crackles auscultated Abdomen-soft, mild generalized tenderness, distended, bowel sounds hyperactive no organomegaly Extremities-no edema in the lower extremities Neuro-alert,  oriented x3, no focal deficit noted  Status is: Inpatient  Dispo: The patient is from: Home              Anticipated d/c is to: Home              Anticipated d/c date is: 11/15/2020              Patient currently not  stable for discharge  Barrier to discharge-partial small bowel obstruction  COVID-19 Labs  Recent Labs    11/12/20 1316  LDH 111    Lab Results  Component Value Date   [place] NEGATIVE 11/11/2020    Microbiology  Recent Results (from the past 240 hour(s))  Culture, blood (single)     Status: None (Preliminary result)   Collection Time: 11/11/20 11:45 AM   Specimen: BLOOD  Result Value Ref Range Status   Specimen Description   Final    BLOOD BLOOD RIGHT FOREARM Performed at [facility] [address], [place], [place] [address]    Special Requests   Final    BOTTLES DRAWN AEROBIC AND ANAEROBIC Blood Culture adequate volume Performed at [facility] [address], [place], [place] [address]    Culture   Final    NO GROWTH < 24 HOURS Performed at [facility] [address] [place],  [address]    Report Status PENDING  Incomplete  Resp Panel by RT-PCR (Flu A&B, Covid) Nasopharyngeal Swab     Status: None   Collection Time: 11/11/20  3:50 PM   Specimen: Nasopharyngeal Swab; Nasopharyngeal(NP) swabs in vial transport medium  Result Value Ref Range Status   SARS Coronavirus 2 by RT PCR NEGATIVE NEGATIVE Final    Comment: (NOTE) SARS-CoV-2 target nucleic acids are NOT DETECTED.  The SARS-CoV-2 RNA is generally detectable in upper respiratory specimens during the acute phase of infection. The lowest concentration of SARS-CoV-2 viral copies this assay can detect is 138 copies/mL. A negative result does not preclude SARS-Cov-2 infection and should not be used as the sole basis for treatment or other patient management decisions. A negative result may occur with  improper specimen  collection/handling, submission of specimen other than nasopharyngeal swab, presence of viral mutation(s) within the areas targeted by this assay, and inadequate number of viral copies(<138 copies/mL). A negative result must be combined with clinical observations, patient history, and epidemiological information. The expected result is Negative.  Fact Sheet for Patients:  EntrepreneurPulse.com.au  Fact Sheet for Healthcare Providers:  IncredibleEmployment.be  This test is no t yet approved or cleared by the [place] FDA and  has been authorized for detection and/or diagnosis of SARS-CoV-2 by FDA under an Emergency Use Authorization (EUA). This EUA will remain  in effect (meaning this test can be used) for the duration of the COVID-19 declaration under Section 564(b)(1) of the Act, 21 U.S.C.section 360bbb-3(b)(1), unless the authorization is terminated  or revoked sooner.       Influenza A by PCR NEGATIVE NEGATIVE Final   Influenza B by PCR NEGATIVE NEGATIVE Final    Comment: (NOTE) The Xpert Xpress SARS-CoV-2/FLU/RSV plus assay is intended as an aid in the diagnosis of influenza from Nasopharyngeal swab specimens and should not be used as a sole basis for treatment. Nasal washings and aspirates are unacceptable for Xpert Xpress SARS-CoV-2/FLU/RSV testing.  Fact Sheet for Patients: EntrepreneurPulse.com.au  Fact Sheet for Healthcare Providers: IncredibleEmployment.be  This test is not yet approved or cleared by the [place] FDA and has been authorized for detection and/or diagnosis of SARS-CoV-2 by FDA under an Emergency Use Authorization (EUA). This EUA will remain in effect (meaning this test can be used) for the duration of the COVID-19 declaration under Section 564(b)(1) of the Act, 21 U.S.C. section 360bbb-3(b)(1), unless the authorization is terminated or revoked.  Performed at [facility], [address] [place], [place]  [facility] If 7PM-7AM, please contact night-coverage at [email or web address], Office  [phone]   11/12/2020, 6:48 PM  LOS: 0 days

## 2020-11-13 DIAGNOSIS — K56600 Partial intestinal obstruction, unspecified as to cause: Secondary | ICD-10-CM | POA: Diagnosis present

## 2020-11-13 DIAGNOSIS — I2510 Atherosclerotic heart disease of native coronary artery without angina pectoris: Secondary | ICD-10-CM | POA: Diagnosis present

## 2020-11-13 DIAGNOSIS — I10 Essential (primary) hypertension: Secondary | ICD-10-CM | POA: Diagnosis present

## 2020-11-13 DIAGNOSIS — Z9049 Acquired absence of other specified parts of digestive tract: Secondary | ICD-10-CM | POA: Diagnosis not present

## 2020-11-13 DIAGNOSIS — Z87891 Personal history of nicotine dependence: Secondary | ICD-10-CM | POA: Diagnosis not present

## 2020-11-13 DIAGNOSIS — E892 Postprocedural hypoparathyroidism: Secondary | ICD-10-CM | POA: Diagnosis present

## 2020-11-13 DIAGNOSIS — Z8546 Personal history of malignant neoplasm of prostate: Secondary | ICD-10-CM | POA: Diagnosis not present

## 2020-11-13 DIAGNOSIS — Z955 Presence of coronary angioplasty implant and graft: Secondary | ICD-10-CM | POA: Diagnosis not present

## 2020-11-13 DIAGNOSIS — G629 Polyneuropathy, unspecified: Secondary | ICD-10-CM | POA: Diagnosis present

## 2020-11-13 DIAGNOSIS — C61 Malignant neoplasm of prostate: Secondary | ICD-10-CM | POA: Diagnosis not present

## 2020-11-13 DIAGNOSIS — I252 Old myocardial infarction: Secondary | ICD-10-CM | POA: Diagnosis not present

## 2020-11-13 DIAGNOSIS — Z8673 Personal history of transient ischemic attack (TIA), and cerebral infarction without residual deficits: Secondary | ICD-10-CM | POA: Diagnosis not present

## 2020-11-13 DIAGNOSIS — E876 Hypokalemia: Secondary | ICD-10-CM | POA: Diagnosis not present

## 2020-11-13 DIAGNOSIS — R590 Localized enlarged lymph nodes: Secondary | ICD-10-CM | POA: Diagnosis present

## 2020-11-13 DIAGNOSIS — E785 Hyperlipidemia, unspecified: Secondary | ICD-10-CM | POA: Diagnosis present

## 2020-11-13 DIAGNOSIS — Z8719 Personal history of other diseases of the digestive system: Secondary | ICD-10-CM | POA: Diagnosis present

## 2020-11-13 DIAGNOSIS — E669 Obesity, unspecified: Secondary | ICD-10-CM | POA: Diagnosis present

## 2020-11-13 DIAGNOSIS — I255 Ischemic cardiomyopathy: Secondary | ICD-10-CM | POA: Diagnosis present

## 2020-11-13 DIAGNOSIS — G4733 Obstructive sleep apnea (adult) (pediatric): Secondary | ICD-10-CM | POA: Diagnosis present

## 2020-11-13 DIAGNOSIS — Z20822 Contact with and (suspected) exposure to covid-19: Secondary | ICD-10-CM | POA: Diagnosis present

## 2020-11-13 DIAGNOSIS — Z881 Allergy status to other antibiotic agents status: Secondary | ICD-10-CM | POA: Diagnosis not present

## 2020-11-13 DIAGNOSIS — D638 Anemia in other chronic diseases classified elsewhere: Secondary | ICD-10-CM | POA: Diagnosis present

## 2020-11-13 DIAGNOSIS — K529 Noninfective gastroenteritis and colitis, unspecified: Secondary | ICD-10-CM | POA: Diagnosis present

## 2020-11-13 DIAGNOSIS — R1033 Periumbilical pain: Secondary | ICD-10-CM | POA: Diagnosis not present

## 2020-11-13 DIAGNOSIS — L03114 Cellulitis of left upper limb: Secondary | ICD-10-CM | POA: Diagnosis present

## 2020-11-13 DIAGNOSIS — Z85028 Personal history of other malignant neoplasm of stomach: Secondary | ICD-10-CM | POA: Diagnosis not present

## 2020-11-13 DIAGNOSIS — Z888 Allergy status to other drugs, medicaments and biological substances status: Secondary | ICD-10-CM | POA: Diagnosis not present

## 2020-11-13 LAB — COMPREHENSIVE METABOLIC PANEL
ALT: 11 U/L (ref 0–44)
AST: 11 U/L — ABNORMAL LOW (ref 15–41)
Albumin: 2.8 g/dL — ABNORMAL LOW (ref 3.5–5.0)
Alkaline Phosphatase: 40 U/L (ref 38–126)
Anion gap: 11 (ref 5–15)
BUN: 53 mg/dL — ABNORMAL HIGH (ref 8–23)
CO2: 25 mmol/L (ref 22–32)
Calcium: 8.1 mg/dL — ABNORMAL LOW (ref 8.9–10.3)
Chloride: 109 mmol/L (ref 98–111)
Creatinine, Ser: 1.02 mg/dL (ref 0.61–1.24)
GFR, Estimated: >60 mL/min (ref >60)
Glucose, Bld: 111 mg/dL — ABNORMAL HIGH (ref 70–99)
Potassium: 2.8 mmol/L — ABNORMAL LOW (ref 3.5–5.1)
Sodium: 145 mmol/L (ref 135–145)
Total Bilirubin: 1 mg/dL (ref 0.3–1.2)
Total Protein: 5.7 g/dL — ABNORMAL LOW (ref 6.5–8.1)

## 2020-11-13 LAB — MAGNESIUM: Magnesium: 1.8 mg/dL (ref 1.7–2.4)

## 2020-11-13 LAB — CBC
HCT: 39.3 % (ref 39.0–52.0)
Hemoglobin: 12.9 g/dL — ABNORMAL LOW (ref 13.0–17.0)
MCH: 32.3 pg (ref 26.0–34.0)
MCHC: 32.8 g/dL (ref 30.0–36.0)
MCV: 98.3 fL (ref 80.0–100.0)
Platelets: 212 10*3/uL (ref 150–400)
RBC: 4 MIL/uL — ABNORMAL LOW (ref 4.22–5.81)
RDW: 14.5 % (ref 11.5–15.5)
WBC: 7.5 10*3/uL (ref 4.0–10.5)
nRBC: 0 % (ref 0.0–0.2)

## 2020-11-13 MED ORDER — POTASSIUM CHLORIDE CRYS ER 10 MEQ PO TBCR
20.0000 meq | EXTENDED_RELEASE_TABLET | Freq: Three times a day (TID) | ORAL | Status: AC
  Administered 2020-11-13 – 2020-11-14 (×2): 20 meq via ORAL
  Filled 2020-11-13 (×2): qty 2

## 2020-11-13 MED ORDER — POTASSIUM CHLORIDE 10 MEQ/100ML IV SOLN: 10.0000 meq | INTRAVENOUS | Status: DC

## 2020-11-13 MED ORDER — LOSARTAN POTASSIUM 50 MG PO TABS
100.0000 mg | ORAL_TABLET | Freq: Every morning | ORAL | Status: DC
  Administered 2020-11-14: 100 mg via ORAL
  Filled 2020-11-13: qty 2

## 2020-11-13 MED ORDER — POTASSIUM CHLORIDE CRYS ER 10 MEQ PO TBCR
20.0000 meq | EXTENDED_RELEASE_TABLET | Freq: Three times a day (TID) | ORAL | Status: DC
  Administered 2020-11-13 (×2): 20 meq via ORAL
  Filled 2020-11-13 (×2): qty 2

## 2020-11-13 MED ORDER — AMLODIPINE BESYLATE 5 MG PO TABS
5.0000 mg | ORAL_TABLET | Freq: Every day | ORAL | Status: DC
  Administered 2020-11-13: 5 mg via ORAL
  Filled 2020-11-13: qty 1

## 2020-11-13 MED ORDER — POTASSIUM CHLORIDE IN NACL 40-0.9 MEQ/L-% IV SOLN
INTRAVENOUS | Status: DC
  Filled 2020-11-13 (×3): qty 1000

## 2020-11-13 MED ORDER — CARVEDILOL 3.125 MG PO TABS
3.1250 mg | ORAL_TABLET | Freq: Two times a day (BID) | ORAL | Status: DC
  Administered 2020-11-14: 3.125 mg via ORAL
  Filled 2020-11-13: qty 1

## 2020-11-13 MED ORDER — ASPIRIN EC 81 MG PO TBEC
81.0000 mg | DELAYED_RELEASE_TABLET | Freq: Every day | ORAL | Status: DC
  Administered 2020-11-13 – 2020-11-14 (×2): 81 mg via ORAL
  Filled 2020-11-13 (×2): qty 1

## 2020-11-13 NOTE — Progress Notes (Addendum)
[facility]  PROGRESS NOTE  Jose Ware [id number] DOB: 1934-03-15 DOA: 11/11/2020 PCP: [name], MD   Brief HPI:   86 year old male with history of prostate cancer, CVA, hypertension presented with nausea vomiting, umbilical abdominal pain.  Symptoms started on Saturday.  Patient says the pain is stabbing, episodic, nonradiating umbilical pain.  This was associated with diaphoresis, nausea and vomiting. In the ED CT scan abdomen/pelvis was concerning for wall thickening of small bowel loops in the central pelvis, cannot exclude enteritis.  Also showed dilated segment of mid small bowel .  Abdominal x-ray this morning showed partial small bowel obstruction.  General surgery has been consulted.   Subjective   Patient seen and examined, he had 3 bowel movements yesterday.  Abdominal distention has resolved.  Denies abdominal pain.   Assessment/Plan:     Partial small bowel obstruction versus ileus -Significantly improved -Abdominal x-ray done yesterday showed partial SBO -General surgery  following -Started on clear liquid diet  Retroperitoneal lymphadenopathy -Seen on CT abdomen/pelvis -Patient has history of prostate cancer -Called and discussed with oncologist Dr. Chryl Heck, she recommended to obtain CT chest -CT chest showed no primary pregnancy -PSA was less than 0.01, LDH-111 -Oncology has seen the patient, recommend to follow-up as outpatient for further work-up with PET/CT and biopsy  Left upper extremity cellulitis -Resolved -Venous duplex of upper left extremity is negative -Patient started empirically on Rocephin.  Will discontinue Rocephin  Hypertension -We will restart home medications  Hypokalemia -Potassium being replaced -Follow BMP in am  Scheduled medications:    potassium chloride  20 mEq Oral TID         Data Reviewed:   CBG:  No results for input(s): GLUCAP in the last 168 hours.  SpO2: 91 %    Vitals:   11/12/20  0934 11/12/20 1244 11/12/20 2103 11/13/20 0619  BP: (!) 152/78 (!) 146/82 137/73 (!) 143/82  Pulse: 75 72 72 69  Resp: '17 18 18 16  '$ Temp: 98.3 F (36.8 C) 98.3 F (36.8 C) 97.9 F (36.6 C) 98.6 F (37 C)  TempSrc: Oral Oral Oral Oral  SpO2: 93% 91% 91% 91%  Weight:      Height:         Intake/Output Summary (Last 24 hours) at 11/13/2020 1807 Last data filed at 11/13/2020 1500 Gross per 24 hour  Intake 2521.47 ml  Output 950 ml  Net 1571.47 ml    09/04 1901 - 09/06 0700 In: 2804.7 [P.O.:476; I.V.:2178.7] Out: 1800 [Urine:1800]  Filed Weights   11/11/20 2102  Weight: 94 kg    CBC:  Recent Labs  Lab 11/11/20 0925 11/12/20 0436 11/13/20 0449  WBC 16.8* 7.7 7.5  HGB 14.5 14.6 12.9*  HCT 43.5 43.5 39.3  PLT 254 235 212  MCV 98.4 96.9 98.3  MCH 32.8 32.5 32.3  MCHC 33.3 33.6 32.8  RDW 14.1 14.2 14.5    Complete metabolic panel:  Recent Labs  Lab 11/11/20 0925 11/11/20 1145 11/12/20 0436 11/13/20 0552  NA 141  --  145 145  K 4.1  --  3.8 2.8*  CL 104  --  109 109  CO2 26  --  26 25  GLUCOSE 183*  --  163* 111*  BUN 39*  --  45* 53*  CREATININE 1.03  --  1.20 1.02  CALCIUM 9.8  --  9.4 8.1*  AST 15  --  18 11*  ALT 12  --  14 11  ALKPHOS 73  --  54 40  BILITOT 1.6*  --  1.4* 1.0  ALBUMIN 4.0  --  3.5 2.8*  MG  --   --   --  1.8  LATICACIDVEN  --  1.2  --   --     Recent Labs  Lab 11/11/20 0925  LIPASE 28    Recent Labs  Lab 11/11/20 1550  SARSCOV2NAA NEGATIVE    ------------------------------------------------------------------------------------------------------------------ No results for input(s): CHOL, HDL, LDLCALC, TRIG, CHOLHDL, LDLDIRECT in the last 72 hours.  Lab Results  Component Value Date   HGBA1C 5.3 11/04/2015   ------------------------------------------------------------------------------------------------------------------ No results for input(s): TSH, T4TOTAL, T3FREE, THYROIDAB in the last 72 hours.  Invalid input(s):  FREET3 ------------------------------------------------------------------------------------------------------------------ No results for input(s): VITAMINB12, FOLATE, FERRITIN, TIBC, IRON, RETICCTPCT in the last 72 hours.  Coagulation profile No results for input(s): INR, PROTIME in the last 168 hours. No results for input(s): DDIMER in the last 72 hours.  Cardiac Enzymes Recent Labs  Lab 11/11/20 0925  CKTOTAL 13*    ------------------------------------------------------------------------------------------------------------------ No results found for: BNP   Antibiotics: Anti-infectives (From admission, onward)    Start     Dose/Rate Route Frequency Ordered Stop   11/12/20 1000  cefTRIAXone (ROCEPHIN) 1 g in sodium chloride 0.9 % 100 mL IVPB  Status:  Discontinued        1 g 200 mL/hr over 30 Minutes Intravenous Every 24 hours 11/11/20 2047 11/13/20 1243   11/11/20 1130  cefTRIAXone (ROCEPHIN) 2 g in sodium chloride 0.9 % 100 mL IVPB        2 g 200 mL/hr over 30 Minutes Intravenous  Once 11/11/20 1119 11/11/20 1355        Radiology Reports  CT CHEST W CONTRAST  Result Date: 11/12/2020 CLINICAL DATA:  Retroperitoneal adenopathy suspicious for metastatic disease. Cancer of unknown primary. Staging. EXAM: CT CHEST WITH CONTRAST TECHNIQUE: Multidetector CT imaging of the chest was performed during intravenous contrast administration. CONTRAST:  65m OMNIPAQUE IOHEXOL 350 MG/ML SOLN COMPARISON:  One-view chest x-ray 11/04/2015 FINDINGS: Cardiovascular: Coronary artery calcifications are present. Heart size is normal. Atherosclerotic calcifications are present in the aorta and branch vessel origins without aneurysm or stenosis. Pulmonary artery there is a pulmonary arteries are within normal limits. Mediastinum/Nodes: No significant mediastinal, hilar, or axillary adenopathy is present. Heterogeneous thyroid present without dominant nodule. Not clinically significant; no follow-up  imaging recommendedesophagus is unremarkable. Lungs/Pleura: Small peripheral high density nodules are present in the right upper thyroid best seen on image 53 of image 5. No other focal nodules are present. Small effusions are present, left greater than right. Associated dependent atelectasis is noted. Airways are patent. Upper Abdomen: Stomach is distended as before. Renal cysts are partially visualized. Cholecystectomy noted. Musculoskeletal: Vertebral body heights and alignment are normal. IMPRESSION: 1. No evidence for primary malignancy in the chest. 2. Coronary artery disease. 3. Small peripheral high density nodules in the right upper lobe are likely post infectious. No follow-up recommended. 4. Aortic Atherosclerosis (ICD10-I70.0). Electronically Signed   By: CSan MorelleM.D.   On: 11/12/2020 14:31   DG Abd Portable 1V  Result Date: 11/12/2020 CLINICAL DATA:  Abdominal pain with nausea and vomiting EXAM: PORTABLE ABDOMEN - 1 VIEW COMPARISON:  CT from yesterday FINDINGS: Continued gas dilated small bowel loops measuring up to 4 cm in diameter. Moderate gaseous distention of the stomach. Similar degree of colonic gas and stool. Postoperative abdomen with pelvic bowel sutures. Prostate brachytherapy seeds. IMPRESSION: Partial small bowel obstruction. No significant change from yesterday. Electronically Signed  By: Monte Fantasia M.D.   On: 11/12/2020 08:35      DVT prophylaxis: SCDs  Code Status: Full code  Family Communication: Discussed with patient's wife and stepdaughter at bedside   Consultants: General surgery  Procedures:     Objective    Physical Examination:   General-appears in no acute distress Heart-S1-S2, regular, no murmur auscultated Lungs-clear to auscultation bilaterally, no wheezing or crackles auscultated Abdomen-soft, nontender, no organomegaly Extremities-no edema in the lower extremities Neuro-alert, oriented x3, no focal deficit noted  Status  is: Inpatient  Dispo: The patient is from: Home              Anticipated d/c is to: Home              Anticipated d/c date is: 11/15/2020              Patient currently not stable for discharge  Barrier to discharge-partial small bowel obstruction  COVID-19 Labs  Recent Labs    11/12/20 1316  LDH 111    Lab Results  Component Value Date   [place] NEGATIVE 11/11/2020    Microbiology  Recent Results (from the past 240 hour(s))  Culture, blood (single)     Status: None (Preliminary result)   Collection Time: 11/11/20 11:45 AM   Specimen: BLOOD  Result Value Ref Range Status   Specimen Description   Final    BLOOD BLOOD RIGHT FOREARM Performed at [facility], [place] [address], [place], [place] [address]    Special Requests   Final    BOTTLES DRAWN AEROBIC AND ANAEROBIC Blood Culture adequate volume Performed at [place] [address] [place], [place] [address]    Culture   Final    NO GROWTH 2 DAYS Performed at [place] [address], [place], [place] [address]    Report Status PENDING  Incomplete  Resp Panel by RT-PCR (Flu A&B, Covid) Nasopharyngeal Swab     Status: None   Collection Time: 11/11/20  3:50 PM   Specimen: Nasopharyngeal Swab; Nasopharyngeal(NP) swabs in vial transport medium  Result Value Ref Range Status   SARS Coronavirus 2 by RT PCR NEGATIVE NEGATIVE Final    Comment: (NOTE) SARS-CoV-2 target nucleic acids are NOT DETECTED.  The SARS-CoV-2 RNA is generally detectable in upper respiratory specimens during the acute phase of infection. The lowest concentration of SARS-CoV-2 viral copies this assay can detect is 138 copies/mL. A negative result does not preclude SARS-Cov-2 infection and should not be used as the sole basis for treatment or other patient management decisions. A negative result may occur with  improper specimen collection/handling, submission of specimen other than  nasopharyngeal swab, presence of viral mutation(s) within the areas targeted by this assay, and inadequate number of viral copies(<138 copies/mL). A negative result must be combined with clinical observations, patient history, and epidemiological information. The expected result is Negative.  Fact Sheet for Patients:  EntrepreneurPulse.com.au  Fact Sheet for Healthcare Providers:  IncredibleEmployment.be  This test is no t yet approved or cleared by the [place] FDA and  has been authorized for detection and/or diagnosis of SARS-CoV-2 by FDA under an Emergency Use Authorization (EUA). This EUA will remain  in effect (meaning this test can be used) for the duration of the COVID-19 declaration under Section 564(b)(1) of the Act, 21 U.S.C.section 360bbb-3(b)(1), unless the authorization is terminated  or revoked sooner.       Influenza A by PCR NEGATIVE NEGATIVE  Final   Influenza B by PCR NEGATIVE NEGATIVE Final    Comment: (NOTE) The Xpert Xpress SARS-CoV-2/FLU/RSV plus assay is intended as an aid in the diagnosis of influenza from Nasopharyngeal swab specimens and should not be used as a sole basis for treatment. Nasal washings and aspirates are unacceptable for Xpert Xpress SARS-CoV-2/FLU/RSV testing.  Fact Sheet for Patients: EntrepreneurPulse.com.au  Fact Sheet for Healthcare Providers: IncredibleEmployment.be  This test is not yet approved or cleared by the [place] FDA and has been authorized for detection and/or diagnosis of SARS-CoV-2 by FDA under an Emergency Use Authorization (EUA). This EUA will remain in effect (meaning this test can be used) for the duration of the COVID-19 declaration under Section 564(b)(1) of the Act, 21 U.S.C. section 360bbb-3(b)(1), unless the authorization is terminated or revoked.  Performed at [facility], [place] [address], [place], [place] [address]              Oswald Hillock   [facility] If 7PM-7AM, please contact night-coverage at [email or web address], Office  [phone]   11/13/2020, 6:07 PM  LOS: 0 days

## 2020-11-13 NOTE — Progress Notes (Signed)
    CC: Abdominal pain and distention  Subjective: Patient says he feels much better today.  His abdominal distention has resolved.  He reports multiple bowel movements yesterday.  He really would like a glass of water.  Objective: Vital signs in last 24 hours: Temp:  [97.9 F (36.6 C)-98.6 F (37 C)] 98.6 F (37 C) (09/06 0619) Pulse Rate:  [69-72] 69 (09/06 0619) Resp:  [16-18] 16 (09/06 0619) BP: (137-146)/(73-82) 143/82 (09/06 0619) SpO2:  [91 %] 91 % (09/06 0619) Last BM Date: 11/09/20 476 p.o. 1965 IV 1650 urine BM x3 T-max 99.4, afebrile yesterday vital signs are stable Potassium 2.8, this a.m. No magnesium recorded. WBC 7.5 CT of the chest yesterday shows no evidence for primary malignancy, coronary disease, small peripheral high density nodule right upper lobe/aortic atherosclerosis Intake/Output from previous day: 09/05 0701 - 09/06 0700 In: 2443.9 [P.O.:476; I.V.:1867.9; IV Piggyback:100] Out: 1650 [Urine:1650] Intake/Output this shift: No intake/output data recorded.  General appearance: alert, cooperative, and no distress Resp: clear to auscultation bilaterally GI: Soft, nontender this a.m. bowel sounds are still high-pitched, positive flatus and BM yesterday.  Lab Results:  Recent Labs    11/12/20 0436 11/13/20 0449  WBC 7.7 7.5  HGB 14.6 12.9*  HCT 43.5 39.3  PLT 235 212    BMET Recent Labs    11/12/20 0436 11/13/20 0552  NA 145 145  K 3.8 2.8*  CL 109 109  CO2 26 25  GLUCOSE 163* 111*  BUN 45* 53*  CREATININE 1.20 1.02  CALCIUM 9.4 8.1*   PT/INR No results for input(s): LABPROT, INR in the last 72 hours.  Recent Labs  Lab 11/11/20 0925 11/12/20 0436 11/13/20 0552  AST 15 18 11*  ALT '12 14 11  '$ ALKPHOS 73 54 40  BILITOT 1.6* 1.4* 1.0  PROT 7.4 6.7 5.7*  ALBUMIN 4.0 3.5 2.8*     Lipase     Component Value Date/Time   LIPASE 28 11/11/2020 0925    sodium chloride 75 mL/hr at 11/13/20 [id number]   cefTRIAXone (ROCEPHIN)  IV 1  g (11/13/20 0907)      Medications:   Assessment/Plan Abdominal pain, nausea, vomiting, abdominal surgeries: Gastric/colon resection/colostomy takedown/appendectomy SBO vs ileus  -No transition zone seen on CT scan/possible enteritis  -Retroperitoneal lymphadenopathy/history of prostate cancer with seed placement. Plan: Replace potassium, clear liquids, if he does well with clear liquids we can advance him to full liquids later.  He can have his home medicines, will defer to [facility].  FEN: N.p.o./IV fluids NS at 75 ml/hour ID: Ceftriaxone 9/4 >> day 3 DVT: SCDs/he can have chemical DVT prophylaxis from our standpoint  Hypokalemia  -Potassium 2.8  - replacing K+ Hx prostate cancer Left upper extremity pain and swelling  -Negative vascular ultrasound Hx CAD/cardiomyopathy  -Coronary stenting 10/2915/EF 45-50% Hx CVA Hx hypertension        LOS: 0 days    Nissa Stannard 11/13/2020 Please see Amion

## 2020-11-13 NOTE — Consult Note (Addendum)
[place]  Telephone:([phone] Fax:([phone] [place]  Referral MD  Reason for Referral: Retroperitoneal lymphadenopathy  Chief Complaint  Patient presents with   Abdominal Pain   Arm Swelling   HPI:   This is a pleasant 86 year old male patient with past medical history significant for prostate cancer several years ago, hypertension presented with chief complaint of abdominal pain and inability to have a bowel movement.  The pain was very intense and was associated with nausea and vomiting and diaphoretic.  He had a bowel movement last night as well as this morning and he feels much better this morning.  He was treated for ileus.    He had a CT abdomen pelvis which showed questionable wall thickening of the small bowel loop in the central pelvis versus artifact from underdistention.   There was new retroperitoneal adenopathy most consistent with metastatic disease though this can also be seen with lymphoma.  Heme-onc was consulted to assist in the work-up of lymphadenopathy.  He denies any fevers, drenching night sweats, loss of appetite or loss of weight.  He feels very well, active for his age, no change in breathing, bowel habits or urinary habits.  He had additional imaging of the chest which did not show any malignant lymphadenopathy.    Rest of the pertinent 10 point ROS reviewed and negative.  Past Medical History:  Diagnosis Date   Adenocarcinoma of prostate [facility])    XRT & Radiation seed implantation in 2010   Adenomatous colon polyp    CAD (coronary artery disease)    a. 11/04/15:  Acute inferolateral STEMI: S/p emergent DES of a very large LCx with extensive thrombus. Significant residual disease in the proximal LAD and distal RCA. S/p staged PCI of LAD and RCA 8/29.   Cataract    CVA (cerebral infarction)    a. 123456: embolic CVA after heart cath    CVA (cerebral infarction)    a. 123456: embolic CVA after  heart cath    DIVERTICULITIS, HX OF 11/27/2006        DJD (degenerative joint disease)    wrist - R    Hernia    History of blood transfusion 1985   with colon surgery   Hypertension    Ischemic cardiomyopathy    a. EF 25-30% by LV gram on cath 11/04/15. Appeared out of proportion to infarct although infarct was large. EF improved by Echo and now 40-45%.   Myocardial infarction ([place])    NEPHROLITHIASIS, HX OF 11/27/2006         Peripheral neuropathy    Psoriasis    Tenosynovitis   :   Past Surgical History:  Procedure Laterality Date   [place]   minor disk surgery: instrumentation placed and removed in a second procedure   CARDIAC CATHETERIZATION N/A 11/04/2015   Procedure: Left Heart Cath and Coronary Angiography;  Surgeon: Peter M Martinique, MD;  Location: [place] [facility];  Service: Cardiovascular;  Laterality: N/A;   CARDIAC CATHETERIZATION N/A 11/04/2015   Procedure: Coronary Stent Intervention;  Surgeon: Peter M Martinique, MD;  Location: [place] [facility];  Service: Cardiovascular;  Laterality: N/A;   CARDIAC CATHETERIZATION N/A 11/06/2015   Procedure: Coronary Stent Intervention;  Surgeon: Leonie Man, MD;  Location: [place] [facility];  Service: Cardiovascular;  Laterality: N/A;   CATARACT EXTRACTION, BILATERAL  2013   CHOLECYSTECTOMY  1986   COLON  SURGERY  1980's   pt. had ileus, had colon surgery, requiring colostomy & then reversal & then dehisence of that wound & return to OR for repair & cholecystectomy     COLONOSCOPY     COLOSTOMY  1985   After colectomy for diverticulitis, pt. remarks during this surgery they "gave me the paddles two times  because of bleeding", pt. states it was unrelated to any anesthesia complication   EYE SURGERY     /w IOL   KNEE ARTHROSCOPY Left 2003   [place], Right w/cartilage removed later   PARATHYROIDECTOMY N/A 05/12/2014   Procedure: PARATHYROIDECTOMY;  Surgeon: Armandina Gemma, MD;   Location: [place];  Service: General;  Laterality: N/A;   REVERSAL OF COLOSTOMY  1987   TONSILLECTOMY  1946   WRIST SURGERY Left    Remote complicated w/infection  :   Current Facility-Administered Medications  Medication Dose Route Frequency Provider Last Rate Last Admin   0.9 % NaCl with KCl 40 mEq / L  infusion   Intravenous Continuous Earnstine Regal, PA-C 100 mL/hr at 11/13/20 1150 New Bag at 11/13/20 1150   hydrALAZINE (APRESOLINE) injection 10 mg  10 mg Intravenous Q8H PRN Marylyn Ishihara, Tyrone A, DO       morphine 2 MG/ML injection 2 mg  2 mg Intravenous Q4H PRN Marylyn Ishihara, Tyrone A, DO   2 mg at 11/12/20 1427   ondansetron (ZOFRAN) tablet 4 mg  4 mg Oral Q6H PRN Cherylann Ratel A, DO       Or   ondansetron (ZOFRAN) injection 4 mg  4 mg Intravenous Q6H PRN Marylyn Ishihara, Tyrone A, DO   4 mg at 11/12/20 1526   potassium chloride (KLOR-CON) CR tablet 20 mEq  20 mEq Oral TID Earnstine Regal, PA-C   20 mEq at 11/13/20 1151      Allergies  Allergen Reactions   Bee Venom Swelling    Facial swelling   Betadine [Povidone Iodine] Other (See Comments)    blistering   Doxycycline     Possible drug rash- back of right lower leg and onto left lower leg as well  :   Family History  Problem Relation Age of Onset   Coronary artery disease Mother    Alcohol abuse Father    Cirrhosis Father    Heart failure Sister    Breast cancer Sister        W/involvement of right arm leading to amputation   Coronary artery disease Brother    Heart attack Brother    Non-Hodgkin's lymphoma Brother    Clotting disorder Brother    Non-Hodgkin's lymphoma Brother    Anemia Sister        related to treatment to lymphoma   Non-Hodgkin's lymphoma Sister    Prostate cancer Neg Hx    Colon cancer Neg Hx    Diabetes Neg Hx    Glaucoma Neg Hx    Rectal cancer Neg Hx    Esophageal cancer Neg Hx   :   Social History   Socioeconomic History   Marital status: Married    Spouse name: Not on file   Number of children: 3    Years of education: Not on file   Highest education level: Not on file  Occupational History   Not on file  Tobacco Use   Smoking status: Former    Packs/day: 2.00    Years: 37.00    Pack years: 74.00    Types: Cigarettes  Quit date: 03/11/1983    Years since quitting: 37.7   Smokeless tobacco: Never   Tobacco comments:    started smoking in the service; ICU 85' part of his stomach; appendix; coma   Vaping Use   Vaping Use: Never used  Substance and Sexual Activity   Alcohol use: Yes    Alcohol/week: 0.0 standard drinks    Comment: RARE   Drug use: No   Sexual activity: Not on file  Other Topics Concern   Not on file  Social History Narrative   [place], [place].   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      Hobbies: fishing, busy volunteering            Social Determinants of Radio broadcast assistant Strain: Low Risk    Difficulty of Paying Living Expenses: Not hard at all  Food Insecurity: No Food Insecurity   Worried About Charity fundraiser in the Last Year: Never true   Arboriculturist in the Last Year: Never true  Transportation Needs: No Transportation Needs   Lack of Transportation (Medical): No   Lack of Transportation (Non-Medical): No  Physical Activity: Sufficiently Active   Days of Exercise per Week: 4 days   Minutes of Exercise per Session: 60 min  Stress: No Stress Concern Present   Feeling of Stress : Not at all  Social Connections: Moderately Integrated   Frequency of Communication with Friends and Family: Once a week   Frequency of Social Gatherings with Friends and Family: Once a week   Attends Religious Services: More than 4 times per year   Active Member of [employer] or  Organizations: Yes   Attends Archivist Meetings: 1 to 4 times per year   Marital Status: Married  Human resources officer Violence: Not At Risk   Fear of Current or Ex-Partner: No   Emotionally Abused: No   Physically Abused: No   Sexually Abused: No    Exam:  Patient Vitals for the past 24 hrs:  BP Temp Temp src Pulse Resp SpO2  11/13/20 0619 (!) 143/82 98.6 F (37 C) Oral 69 16 91 %  11/12/20 2103 137/73 97.9 F (36.6 C) Oral 72 18 91 %   Physical Exam Constitutional:      Appearance: He is well-developed.  HENT:     Head: Normocephalic and atraumatic.  Cardiovascular:     Rate and Rhythm: Normal rate and regular rhythm.  Pulmonary:     Effort: Pulmonary effort is normal.     Breath sounds: Normal breath sounds.  Abdominal:     General: Abdomen is flat. Bowel sounds are decreased.     Palpations: Abdomen is soft. There is no hepatomegaly.  Skin:    General: Skin is warm and dry.  Neurological:     General: No focal deficit present.     Mental Status: He is alert.  Psychiatric:        Mood and Affect: Mood normal.        Behavior: Behavior normal.    Lab Results  Component Value Date   WBC 7.5 11/13/2020   HGB 12.9 (L) 11/13/2020  HCT 39.3 11/13/2020   PLT 212 11/13/2020   GLUCOSE 111 (H) 11/13/2020   CHOL 73 12/23/2019   TRIG 57 12/23/2019   HDL 35 (L) 12/23/2019   LDLDIRECT 33.0 06/25/2020   LDLCALC 25 12/23/2019   ALT 11 11/13/2020   AST 11 (L) 11/13/2020   NA 145 11/13/2020   K 2.8 (L) 11/13/2020   CL 109 11/13/2020   CREATININE 1.02 11/13/2020   BUN 53 (H) 11/13/2020   CO2 25 11/13/2020    CT CHEST W CONTRAST  Result Date: 11/12/2020 CLINICAL DATA:  Retroperitoneal adenopathy suspicious for metastatic disease. Cancer of unknown primary. Staging. EXAM: CT CHEST WITH CONTRAST TECHNIQUE: Multidetector CT imaging of the chest was performed during intravenous contrast administration. CONTRAST:  40m OMNIPAQUE IOHEXOL 350 MG/ML SOLN COMPARISON:   One-view chest x-ray 11/04/2015 FINDINGS: Cardiovascular: Coronary artery calcifications are present. Heart size is normal. Atherosclerotic calcifications are present in the aorta and branch vessel origins without aneurysm or stenosis. Pulmonary artery there is a pulmonary arteries are within normal limits. Mediastinum/Nodes: No significant mediastinal, hilar, or axillary adenopathy is present. Heterogeneous thyroid present without dominant nodule. Not clinically significant; no follow-up imaging recommendedesophagus is unremarkable. Lungs/Pleura: Small peripheral high density nodules are present in the right upper thyroid best seen on image 53 of image 5. No other focal nodules are present. Small effusions are present, left greater than right. Associated dependent atelectasis is noted. Airways are patent. Upper Abdomen: Stomach is distended as before. Renal cysts are partially visualized. Cholecystectomy noted. Musculoskeletal: Vertebral body heights and alignment are normal. IMPRESSION: 1. No evidence for primary malignancy in the chest. 2. Coronary artery disease. 3. Small peripheral high density nodules in the right upper lobe are likely post infectious. No follow-up recommended. 4. Aortic Atherosclerosis (ICD10-I70.0). Electronically Signed   By: CSan MorelleM.D.   On: 11/12/2020 14:31   CT ABDOMEN PELVIS W CONTRAST  Result Date: 11/11/2020 CLINICAL DATA:  RIGHT-side abdominal pain since yesterday, history prostate cancer EXAM: CT ABDOMEN AND PELVIS WITH CONTRAST TECHNIQUE: Multidetector CT imaging of the abdomen and pelvis was performed using the standard protocol following bolus administration of intravenous contrast. CONTRAST:  867mOMNIPAQUE IOHEXOL 350 MG/ML SOLN IV. No oral contrast. COMPARISON:  01/05/2015 FINDINGS: Lower chest: Dependent bibasilar atelectasis Hepatobiliary: Gallbladder surgically absent. Intrahepatic biliary dilatation slightly increased from previous exam. CBD normal  caliber without calcification. No focal hepatic mass. Pancreas: Normal appearance Spleen: Normal appearance Adrenals/Urinary Tract: Adrenal glands normal appearance. Multiple renal cysts bilaterally. No hydronephrosis or hydroureter. Bladder unremarkable. Stomach/Bowel: Appendix surgically absent by history. Sigmoid anastomosis. No colonic abnormalities. Distended stomach. Scattered dilated proximal mid small bowel loops with intervening area of normal caliber. Decompressed distal small bowel loops. Questionable wall thickening of a small bowel loop in the central pelvis versus artifact from underdistention, cannot exclude enteritis. A discrete point of bowel obstruction/transition zone is not identified. Vascular/Lymphatic: Extensive atherosclerotic calcifications aorta, iliac arteries, visceral arteries, coronary arteries. Aorta normal caliber. LEFT para-aortic adenopathy, new, including 27 mm short axis node image 40, 14 mm short axis node image 44, and 15 mm short axis node image 45. Enlarged aortocaval nodes 19 mm image 40 and 21 mm image 33. Additional adenopathy anterior to the aorta and aortocaval region up to 15 mm. Multiple normal to upper normal size mesenteric lymph nodes. Enlarged retrocrural node 15 mm image 19 previously 16 mm. Reproductive: Brachytherapy seed implants within prostate gland/bed. Seminal vesicles unremarkable. Other: Small LEFT inguinal hernia containing fat. No free air or free  fluid. Musculoskeletal: Osseous demineralization. Scattered enthesopathy. Mild sclerosis at RIGHT SI joint. Lytic lesion L2 vertebral body unchanged. Arachnoid cyst versus Tarlov cyst S2 sacral canal unchanged. Degenerative disc disease changes lumbar spine with chronic superior endplate compression deformity L1 unchanged. IMPRESSION: Questionable wall thickening of a small bowel loop in the central pelvis versus artifact from underdistention, cannot exclude enteritis. Scattered small bowel loops appear dilated  though a segment of mid small bowel and the distal small bowel appear normal in caliber, with no discrete transition zone identified. New retroperitoneal adenopathy most consistent with metastatic disease though this can also be seen with lymphoma. Increase in intrahepatic biliary dilatation post cholecystectomy, recommend correlation with LFTs. Small LEFT inguinal hernia containing fat. Aortic Atherosclerosis (ICD10-I70.0). Electronically Signed   By: Lavonia Dana M.D.   On: 11/11/2020 12:34   DG Abd Portable 1V  Result Date: 11/12/2020 CLINICAL DATA:  Abdominal pain with nausea and vomiting EXAM: PORTABLE ABDOMEN - 1 VIEW COMPARISON:  CT from yesterday FINDINGS: Continued gas dilated small bowel loops measuring up to 4 cm in diameter. Moderate gaseous distention of the stomach. Similar degree of colonic gas and stool. Postoperative abdomen with pelvic bowel sutures. Prostate brachytherapy seeds. IMPRESSION: Partial small bowel obstruction. No significant change from yesterday. Electronically Signed   By: Monte Fantasia M.D.   On: 11/12/2020 08:35   UE Venous Duplex ([facility] and [facility] ONLY)  Result Date: 11/11/2020 UPPER VENOUS STUDY  Patient Name:  Jose Ware [facility]  Date of Exam:   11/11/2020 Medical Rec #: [id number]           Accession #:    [id number] Date of Birth: [birth year]           Patient Gender: M Patient Age:   80 years Exam Location:  [facility] Procedure:      VAS [place] UPPER EXTREMITY VENOUS DUPLEX Referring Phys: Wynona Dove --------------------------------------------------------------------------------  Indications: Edema, and Erythema Comparison Study: No prior study Performing Technologist: Maudry Mayhew MHA, RDMS, RVT, RDCS  Examination Guidelines: A complete evaluation includes B-mode imaging, spectral Doppler, color Doppler, and power Doppler as needed of all accessible portions of each vessel. Bilateral testing is considered an integral part of a complete examination. Limited  examinations for reoccurring indications may be performed as noted.  Right Findings: +----------+------------+---------+-----------+----------+-------+ RIGHT     CompressiblePhasicitySpontaneousPropertiesSummary +----------+------------+---------+-----------+----------+-------+ Subclavian               Yes       Yes                      +----------+------------+---------+-----------+----------+-------+  Left Findings: +----------+------------+---------+-----------+----------+-------+ LEFT      CompressiblePhasicitySpontaneousPropertiesSummary +----------+------------+---------+-----------+----------+-------+ IJV           Full       Yes       Yes                      +----------+------------+---------+-----------+----------+-------+ Subclavian    Full       Yes       Yes                      +----------+------------+---------+-----------+----------+-------+ Axillary      Full       Yes       Yes                      +----------+------------+---------+-----------+----------+-------+ Brachial      Full  Yes       Yes                      +----------+------------+---------+-----------+----------+-------+ Radial        Full                                          +----------+------------+---------+-----------+----------+-------+ Ulnar         Full                                          +----------+------------+---------+-----------+----------+-------+ Cephalic      Full                                          +----------+------------+---------+-----------+----------+-------+ Basilic       Full                                          +----------+------------+---------+-----------+----------+-------+  Summary:  Right: No evidence of thrombosis in the subclavian.  Left: No evidence of deep vein thrombosis in the upper extremity. No evidence of superficial vein thrombosis in the upper extremity.  *See table(s) above for measurements and  observations.  Diagnosing physician: Deitra Mayo MD Electronically signed by Deitra Mayo MD on 11/11/2020 at 1:54:54 PM.    Final     CT CHEST W CONTRAST  Result Date: 11/12/2020 CLINICAL DATA:  Retroperitoneal adenopathy suspicious for metastatic disease. Cancer of unknown primary. Staging. EXAM: CT CHEST WITH CONTRAST TECHNIQUE: Multidetector CT imaging of the chest was performed during intravenous contrast administration. CONTRAST:  57m OMNIPAQUE IOHEXOL 350 MG/ML SOLN COMPARISON:  One-view chest x-ray 11/04/2015 FINDINGS: Cardiovascular: Coronary artery calcifications are present. Heart size is normal. Atherosclerotic calcifications are present in the aorta and branch vessel origins without aneurysm or stenosis. Pulmonary artery there is a pulmonary arteries are within normal limits. Mediastinum/Nodes: No significant mediastinal, hilar, or axillary adenopathy is present. Heterogeneous thyroid present without dominant nodule. Not clinically significant; no follow-up imaging recommendedesophagus is unremarkable. Lungs/Pleura: Small peripheral high density nodules are present in the right upper thyroid best seen on image 53 of image 5. No other focal nodules are present. Small effusions are present, left greater than right. Associated dependent atelectasis is noted. Airways are patent. Upper Abdomen: Stomach is distended as before. Renal cysts are partially visualized. Cholecystectomy noted. Musculoskeletal: Vertebral body heights and alignment are normal. IMPRESSION: 1. No evidence for primary malignancy in the chest. 2. Coronary artery disease. 3. Small peripheral high density nodules in the right upper lobe are likely post infectious. No follow-up recommended. 4. Aortic Atherosclerosis (ICD10-I70.0). Electronically Signed   By: CSan MorelleM.D.   On: 11/12/2020 14:31   CT ABDOMEN PELVIS W CONTRAST  Result Date: 11/11/2020 CLINICAL DATA:  RIGHT-side abdominal pain since yesterday,  history prostate cancer EXAM: CT ABDOMEN AND PELVIS WITH CONTRAST TECHNIQUE: Multidetector CT imaging of the abdomen and pelvis was performed using the standard protocol following bolus administration of intravenous contrast. CONTRAST:  835mOMNIPAQUE IOHEXOL 350 MG/ML SOLN IV. No oral contrast. COMPARISON:  01/05/2015 FINDINGS: Lower chest: Dependent  bibasilar atelectasis Hepatobiliary: Gallbladder surgically absent. Intrahepatic biliary dilatation slightly increased from previous exam. CBD normal caliber without calcification. No focal hepatic mass. Pancreas: Normal appearance Spleen: Normal appearance Adrenals/Urinary Tract: Adrenal glands normal appearance. Multiple renal cysts bilaterally. No hydronephrosis or hydroureter. Bladder unremarkable. Stomach/Bowel: Appendix surgically absent by history. Sigmoid anastomosis. No colonic abnormalities. Distended stomach. Scattered dilated proximal mid small bowel loops with intervening area of normal caliber. Decompressed distal small bowel loops. Questionable wall thickening of a small bowel loop in the central pelvis versus artifact from underdistention, cannot exclude enteritis. A discrete point of bowel obstruction/transition zone is not identified. Vascular/Lymphatic: Extensive atherosclerotic calcifications aorta, iliac arteries, visceral arteries, coronary arteries. Aorta normal caliber. LEFT para-aortic adenopathy, new, including 27 mm short axis node image 40, 14 mm short axis node image 44, and 15 mm short axis node image 45. Enlarged aortocaval nodes 19 mm image 40 and 21 mm image 33. Additional adenopathy anterior to the aorta and aortocaval region up to 15 mm. Multiple normal to upper normal size mesenteric lymph nodes. Enlarged retrocrural node 15 mm image 19 previously 16 mm. Reproductive: Brachytherapy seed implants within prostate gland/bed. Seminal vesicles unremarkable. Other: Small LEFT inguinal hernia containing fat. No free air or free fluid.  Musculoskeletal: Osseous demineralization. Scattered enthesopathy. Mild sclerosis at RIGHT SI joint. Lytic lesion L2 vertebral body unchanged. Arachnoid cyst versus Tarlov cyst S2 sacral canal unchanged. Degenerative disc disease changes lumbar spine with chronic superior endplate compression deformity L1 unchanged. IMPRESSION: Questionable wall thickening of a small bowel loop in the central pelvis versus artifact from underdistention, cannot exclude enteritis. Scattered small bowel loops appear dilated though a segment of mid small bowel and the distal small bowel appear normal in caliber, with no discrete transition zone identified. New retroperitoneal adenopathy most consistent with metastatic disease though this can also be seen with lymphoma. Increase in intrahepatic biliary dilatation post cholecystectomy, recommend correlation with LFTs. Small LEFT inguinal hernia containing fat. Aortic Atherosclerosis (ICD10-I70.0). Electronically Signed   By: Lavonia Dana M.D.   On: 11/11/2020 12:34   DG Abd Portable 1V  Result Date: 11/12/2020 CLINICAL DATA:  Abdominal pain with nausea and vomiting EXAM: PORTABLE ABDOMEN - 1 VIEW COMPARISON:  CT from yesterday FINDINGS: Continued gas dilated small bowel loops measuring up to 4 cm in diameter. Moderate gaseous distention of the stomach. Similar degree of colonic gas and stool. Postoperative abdomen with pelvic bowel sutures. Prostate brachytherapy seeds. IMPRESSION: Partial small bowel obstruction. No significant change from yesterday. Electronically Signed   By: Monte Fantasia M.D.   On: 11/12/2020 08:35   UE Venous Duplex ([facility] ONLY)  Result Date: 11/11/2020 UPPER VENOUS STUDY  Patient Name:  GORDON TRISLER [facility]  Date of Exam:   11/11/2020 Medical Rec #: [id number]           Accession #:    [id number] Date of Birth: [birth year]           Patient Gender: M Patient Age:   66 years Exam Location:  [facility] Procedure:      VAS [place] UPPER EXTREMITY VENOUS  DUPLEX Referring Phys: Wynona Dove --------------------------------------------------------------------------------  Indications: Edema, and Erythema Comparison Study: No prior study Performing Technologist: Maudry Mayhew MHA, RDMS, RVT, RDCS  Examination Guidelines: A complete evaluation includes B-mode imaging, spectral Doppler, color Doppler, and power Doppler as needed of all accessible portions of each vessel. Bilateral testing is considered an integral part of a complete examination. Limited examinations for reoccurring indications may be  performed as noted.  Right Findings: +----------+------------+---------+-----------+----------+-------+ RIGHT     CompressiblePhasicitySpontaneousPropertiesSummary +----------+------------+---------+-----------+----------+-------+ Subclavian               Yes       Yes                      +----------+------------+---------+-----------+----------+-------+  Left Findings: +----------+------------+---------+-----------+----------+-------+ LEFT      CompressiblePhasicitySpontaneousPropertiesSummary +----------+------------+---------+-----------+----------+-------+ IJV           Full       Yes       Yes                      +----------+------------+---------+-----------+----------+-------+ Subclavian    Full       Yes       Yes                      +----------+------------+---------+-----------+----------+-------+ Axillary      Full       Yes       Yes                      +----------+------------+---------+-----------+----------+-------+ Brachial      Full       Yes       Yes                      +----------+------------+---------+-----------+----------+-------+ Radial        Full                                          +----------+------------+---------+-----------+----------+-------+ Ulnar         Full                                          +----------+------------+---------+-----------+----------+-------+  Cephalic      Full                                          +----------+------------+---------+-----------+----------+-------+ Basilic       Full                                          +----------+------------+---------+-----------+----------+-------+  Summary:  Right: No evidence of thrombosis in the subclavian.  Left: No evidence of deep vein thrombosis in the upper extremity. No evidence of superficial vein thrombosis in the upper extremity.  *See table(s) above for measurements and observations.  Diagnosing physician: Deitra Mayo MD Electronically signed by Deitra Mayo MD on 11/11/2020 at 1:54:54 PM.    Final     Assessment and Plan:  This is a very pleasant 86 year old male patient with prostate cancer, hypertension who presented with ileus and was found to have some retroperitoneal lymphadenopathy concerning for metastatic disease and hence hematology oncology was consulted.  He denies any health complaints except for this abdominal pain episode.  He had multiple abdominal surgeries many years ago and since then has had some chronic diarrhea.  No hematochezia or melena or other change in bowel habits.  Since he started having bowel movement  last night, he feels much better today.  No B symptoms at baseline.  No change in breathing, urinary habits or neurological complaints.  He also had a CT chest which did not show any evidence of malignant lymphadenopathy.  PSA and LDH were normal.  Mild anemia, normocytic normochromic which is chronic.  No leukopenia or thrombocytopenia. Physical examination, healthy appearing male patient, multiple surgical scars in the abdomen noted, bowel sounds slow but present. No palpable cervical lymphadenopathy or splenomegaly. PSA is normal, unlikely prostate adenocarcinoma. It appears that he had some degree of retroperitoneal and mesenteric lymphadenopathy since 2015 which has slightly progressed according to my discussion with radiology. We  will arrange for outpatient PET/CT and biopsy and follow-up with hematology oncology.  Most likely this is a very slow-growing disease process.  Patient is agreeable to these recommendations.  The length of time of the face-to-face encounter was  55 minutes. More than 50% of time was spent counseling and coordination of care.

## 2020-11-13 NOTE — Evaluation (Signed)
Physical Therapy Evaluation Patient Details Name: Jose Ware MRN: [id number] DOB: 12-Jan-1935 Today's Date: 11/13/2020   History of Present Illness  86 yo male admitted wit SBO. Hx of prostate ca, CVA, LBP, CAD  Clinical Impression  On eval, pt was Min A for mobility. He walked ~350 feet with a RW. He tolerated distance well. He is unsteady at times. Recommend RW for ambulation safety and HHPT f/u. Pt and wife state they will consider both recommendations.     Follow Up Recommendations Home health PT    Equipment Recommendations  Rolling walker with 5" wheels    Recommendations for Other Services       Precautions / Restrictions Precautions Precautions: Fall Restrictions Weight Bearing Restrictions: No      Mobility  Bed Mobility Overal bed mobility: Modified Independent                  Transfers Overall transfer level: Needs assistance Equipment used: Rolling walker (2 wheeled) Transfers: Sit to/from Stand Sit to Stand: Min guard         General transfer comment: Min guard for safety. Increased time and attempts. Cues for safety, hand placement.  Ambulation/Gait Ambulation/Gait assistance: Min assist Gait Distance (Feet): 350 Feet Assistive device: Rolling walker (2 wheeled) Gait Pattern/deviations: Step-through pattern;Decreased stride length;Decreased step length - right;Decreased step length - left     General Gait Details: Intermittent assist to steady and manage RW safety. Short steps and quick pace noted. Cues for safety, pacing. Dyspnea 2/4  Stairs            Wheelchair Mobility    Modified Rankin (Stroke Patients Only)       Balance Overall balance assessment: Needs assistance         Standing balance support: Bilateral upper extremity supported Standing balance-Leahy Scale: Poor                               Pertinent Vitals/Pain Pain Assessment: Faces Faces Pain Scale: Hurts a little bit Pain Location:  abdomen Pain Descriptors / Indicators: Discomfort Pain Intervention(s): Monitored during session;Repositioned    Home Living Family/patient expects to be discharged to:: Private residence Living Arrangements: Spouse/significant other Available Help at Discharge: Family Type of Home: House Home Access: Stairs to enter Entrance Stairs-Rails: None Technical brewer of Steps: 1 Home Layout: One level Home Equipment:  (walking stick)      Prior Function Level of Independence: Independent               Hand Dominance        Extremity/Trunk Assessment   Upper Extremity Assessment Upper Extremity Assessment: Overall WFL for tasks assessed    Lower Extremity Assessment Lower Extremity Assessment: Generalized weakness    Cervical / Trunk Assessment Cervical / Trunk Assessment: Normal  Communication   Communication: No difficulties  Cognition Arousal/Alertness: Awake/alert Behavior During Therapy: WFL for tasks assessed/performed Overall Cognitive Status: Within Functional Limits for tasks assessed                                        General Comments      Exercises     Assessment/Plan    PT Assessment Patient needs continued PT services  PT Problem List Decreased strength;Decreased mobility;Decreased activity tolerance;Decreased balance;Decreased knowledge of use of DME  PT Treatment Interventions DME instruction;Gait training;Therapeutic exercise;Balance training;Functional mobility training;Therapeutic activities;Patient/family education    PT Goals (Current goals can be found in the Care Plan section)  Acute Rehab PT Goals Patient Stated Goal: regain independence PT Goal Formulation: With patient Time For Goal Achievement: 11/27/20 Potential to Achieve Goals: Good    Frequency Min 3X/week   Barriers to discharge        Co-evaluation               AM-PAC PT "6 Clicks" Mobility  Outcome Measure Help needed  turning from your back to your side while in a flat bed without using bedrails?: None Help needed moving from lying on your back to sitting on the side of a flat bed without using bedrails?: None Help needed moving to and from a bed to a chair (including a wheelchair)?: A Little Help needed standing up from a chair using your arms (e.g., wheelchair or bedside chair)?: A Little Help needed to walk in hospital room?: A Little Help needed climbing 3-5 steps with a railing? : A Little 6 Click Score: 20    End of Session Equipment Utilized During Treatment: Gait belt Activity Tolerance: Patient tolerated treatment well Patient left: in chair;with call bell/phone within reach;with family/visitor present   PT Visit Diagnosis: Muscle weakness (generalized) (M62.81);Unsteadiness on feet (R26.81)    Time: [id number] PT Time Calculation (min) (ACUTE ONLY): 25 min   Charges:   PT Evaluation $PT Eval Low Complexity: 1 Low PT Treatments $Gait Training: 8-22 mins           Doreatha Massed, PT Acute Rehabilitation  Office: [phone] Pager: [phone]

## 2020-11-14 LAB — BASIC METABOLIC PANEL
Anion gap: 4 — ABNORMAL LOW (ref 5–15)
BUN: 34 mg/dL — ABNORMAL HIGH (ref 8–23)
CO2: 24 mmol/L (ref 22–32)
Calcium: 7.8 mg/dL — ABNORMAL LOW (ref 8.9–10.3)
Chloride: 115 mmol/L — ABNORMAL HIGH (ref 98–111)
Creatinine, Ser: 0.82 mg/dL (ref 0.61–1.24)
GFR, Estimated: >60 mL/min (ref >60)
Glucose, Bld: 100 mg/dL — ABNORMAL HIGH (ref 70–99)
Potassium: 3.7 mmol/L (ref 3.5–5.1)
Sodium: 143 mmol/L (ref 135–145)

## 2020-11-14 MED ORDER — POTASSIUM CHLORIDE CRYS ER 10 MEQ PO TBCR
40.0000 meq | EXTENDED_RELEASE_TABLET | Freq: Once | ORAL | Status: AC
  Administered 2020-11-14: 40 meq via ORAL
  Filled 2020-11-14: qty 4

## 2020-11-14 MED ORDER — MAGNESIUM SULFATE 2 GM/50ML IV SOLN
2.0000 g | Freq: Once | INTRAVENOUS | Status: AC
  Administered 2020-11-14: 2 g via INTRAVENOUS
  Filled 2020-11-14: qty 50

## 2020-11-14 NOTE — Progress Notes (Signed)
    CC: Abdominal pain and distention  Subjective: He feels much better today, he was advanced to full liquids, he is having bowel movements although they are not recorded.  Objective: Vital signs in last 24 hours: Temp:  [98.1 F (36.7 C)-98.5 F (36.9 C)] 98.1 F (36.7 C) (09/07 0508) Pulse Rate:  [67-68] 67 (09/07 0508) Resp:  [20] 20 (09/07 0508) BP: (148-150)/(72-82) 148/72 (09/07 0508) SpO2:  [93 %-95 %] 95 % (09/07 0508) Last BM Date: 11/13/20 1000 p.o. recorded 313 IV 250 urine recorded No BM recorded Afebrile vital signs are stable Potassium 3.8>> 2.8>> 3.7 Mag 1.8 yesterday  Intake/Output from previous day: 09/06 0701 - 09/07 0700 In: 1313.6 [P.O.:1000; I.V.:313.6] Out: 250 [Urine:250] Intake/Output this shift: No intake/output data recorded.  General appearance: alert, cooperative, and no distress Resp: clear to auscultation bilaterally GI: soft, non-tender; bowel sounds normal; no masses,  no organomegaly  Lab Results:  Recent Labs    11/12/20 0436 11/13/20 0449  WBC 7.7 7.5  HGB 14.6 12.9*  HCT 43.5 39.3  PLT 235 212    BMET Recent Labs    11/13/20 0552 11/14/20 0511  NA 145 143  K 2.8* 3.7  CL 109 115*  CO2 25 24  GLUCOSE 111* 100*  BUN 53* 34*  CREATININE 1.02 0.82  CALCIUM 8.1* 7.8*   PT/INR No results for input(s): LABPROT, INR in the last 72 hours.  Recent Labs  Lab 11/11/20 0925 11/12/20 0436 11/13/20 0552  AST 15 18 11*  ALT '12 14 11  '$ ALKPHOS 73 54 40  BILITOT 1.6* 1.4* 1.0  PROT 7.4 6.7 5.7*  ALBUMIN 4.0 3.5 2.8*     Lipase     Component Value Date/Time   LIPASE 28 11/11/2020 0925     Medications:  amLODipine  5 mg Oral Q supper   aspirin EC  81 mg Oral Daily   carvedilol  3.125 mg Oral BID WC   losartan  100 mg Oral q morning   potassium chloride  20 mEq Oral TID    Assessment/Plan Abdominal pain, nausea, vomiting, abdominal surgeries: Gastric/colon resection/colostomy takedown/appendectomy SBO vs  ileus  -No transition zone seen on CT scan/possible enteritis  -Retroperitoneal lymphadenopathy/history of prostate cancer with seed placement. Plan: I continue to replace his potassium try and get him up to around 4 but if he tolerates full liquids and continues to have bowel movements he can go home from our standpoint and follow-up with his PCP.FEN: Clears/IV fluids  >> advanced to full liquids today ID: Ceftriaxone 9/4 >> day 3 DVT: SCDs/he can have chemical DVT prophylaxis from our standpoint   Hypokalemia  - Potassium 3.8>> 2.8>> 3.7  - replacing K+ Hx prostate cancer Left upper extremity pain and swelling  -Negative vascular ultrasound Hx CAD/cardiomyopathy  -Coronary stenting 10/2915/EF 45-50% Hx CVA Hx hypertension         LOS: 1 day    Jose Ware 11/14/2020 Please see Amion

## 2020-11-14 NOTE — Progress Notes (Signed)
   11/14/20 1200  Mobility  Activity Ambulated in hall  Level of Assistance Contact guard assist, steadying assist  [place] wheel walker  Distance Ambulated (ft) 300 ft  Mobility Ambulated with assistance in hallway  Mobility Response Tolerated well  Mobility performed by Mobility specialist  $Mobility charge 1 Mobility   Pt eager to ambulate upon arrival. Ambulated in hall about 346f with RW, tolerated well. He noted that he usually walks at home with a walking stick. No other complaints. Pt left in bed with call bell at side. RN notified of session.    KEtonSpecialist Acute Rehab Services Office: [phone]

## 2020-11-14 NOTE — TOC Initial Note (Signed)
Transition of Care [facility]) - Initial/Assessment Note    Patient Details  Name: Jose Ware MRN: [id number] Date of Birth: 1934/12/26  Transition of Care [facility]) CM/SW Contact:    Jose Mage, LCSW Phone Number: 11/14/2020, 10:33 AM  Clinical Narrative:   Patient seen in follow up to PT recommendation of [facility].  Jose Ware lives here in [place] with his wife, his step daughter lives just down the road from them.  He uses a walking stick to ambulate outside, has no other DME as he is able to do all ADLs and is independent.  He is open to PT services.  Contacted Jose Ware with Jose Ware who agreed to provide [facility] services for patient.  No further needs identified. TOC will continue to follow during the course of hospitalization.                Expected Discharge Plan: [place] Barriers to Discharge: No Barriers Identified   Patient Goals and CMS Choice     Choice offered to / list presented to : Patient  Expected Discharge Plan and Services Expected Discharge Plan: [place]   Discharge Planning Services: CM Consult Post Acute Care Choice: [place] arrangements for the past 2 months: Single Family Home                                      Prior Living Arrangements/Services Living arrangements for the past 2 months: Single Family Home Lives with:: Spouse Patient language and need for interpreter reviewed:: Yes        Need for Family Participation in Patient Care: Yes (Comment) Care giver support system in place?: Yes (comment)   Criminal Activity/Legal Involvement Pertinent to Current Situation/Hospitalization: No - Comment as needed  Activities of Daily Living Home Assistive Devices/Equipment: Eyeglasses, Cane (specify quad or straight) ADL Screening (condition at time of admission) Patient's cognitive ability adequate to safely complete daily activities?: Yes Is the patient deaf or have difficulty hearing?: No Does the  patient have difficulty seeing, even when wearing glasses/contacts?: No Does the patient have difficulty concentrating, remembering, or making decisions?: No Patient able to express need for assistance with ADLs?: No Does the patient have difficulty dressing or bathing?: No Independently performs ADLs?: Yes (appropriate for developmental age) Does the patient have difficulty walking or climbing stairs?: Yes Weakness of Legs: Both Weakness of Arms/Hands: Both  Permission Sought/Granted                  Emotional Assessment Appearance:: Appears stated age Attitude/Demeanor/Rapport: Engaged Affect (typically observed): Appropriate Orientation: : Oriented to Self, Oriented to Place, Oriented to  Time, Oriented to Situation Alcohol / Substance Use: Not Applicable Psych Involvement: No (comment)  Admission diagnosis:  Enteritis [K52.9] Cellulitis of left upper extremity [L03.114] Abdominal pain [R10.9] Lymphadenopathy, abdominal [R59.0] Partial small bowel obstruction ([place]) [K56.600] Patient Active Problem List   Diagnosis Date Noted   Partial small bowel obstruction ([place]) 11/13/2020   Abdominal pain 11/11/2020   Pain in left wrist 05/15/2020   OSA (obstructive sleep apnea) 05/08/2020   Pain of back and left lower extremity 06/08/2019   Left knee pain 06/06/2019   Onychomycosis 10/28/2017   Spondylosis 06/25/2017   Post herpetic neuralgia 06/20/2017   Spinal stenosis of lumbar region at multiple levels 04/30/2017   Drug reaction 02/05/2017   Senile purpura ([place]) 02/05/2017  Dyslipidemia 11/16/2015   History of ventricular tachycardia 11/08/2015   CAD S/P PCI    Ischemic cardiomyopathy    History of embolic stroke    Post-traumatic osteoarthritis of right wrist 04/25/2015   History of adenomatous polyp of colon 01/18/2015   Primary hyperparathyroidism ([place]) 12/28/2013   History of stomach cancer 12/01/2013   Fatigue 12/01/2013   Lower back pain 03/24/2013    Bradycardia 09/23/2012   Bruising 09/23/2012   B12 deficiency 09/17/2010   History of prostate cancer 12/14/2008   PERIPHERAL NEUROPATHY 10/18/2007   PSORIASIS 10/15/2007   Essential hypertension 11/27/2006   DEGENERATIVE JOINT DISEASE 11/27/2006   NEPHROLITHIASIS, HX OF 11/27/2006   PCP:  Jose Olp, MD Pharmacy:   [place], [address] [place] [address] Phone: [phone] Fax: [phone]     Social Determinants of Health (SDOH) Interventions    Readmission Risk Interventions No flowsheet data found.

## 2020-11-14 NOTE — Discharge Summary (Signed)
Physician Discharge Summary  Jose Ware [id number] DOB: 1934-03-26 DOA: 11/11/2020  PCP: [name], MD  Admit date: 11/11/2020 Discharge date: 11/14/2020  Admitted From: home Discharge disposition: home   Recommendations for Outpatient Follow-Up:   Full liquid- slowly advance to soft Home health BMP 1 week-- goal K is 4 Oncology saw the patient, recommend to follow-up as outpatient for further work-up with PET/CT and biopsy   Discharge Diagnosis:   Active Problems:   History of prostate cancer   History of stomach cancer   OSA (obstructive sleep apnea)   Abdominal pain   Partial small bowel obstruction ([place])    Discharge Condition: Improved.   Wound care: None.  Code status: Full.   History of Present Illness:   Jose Ware is a 86 y.o. male with medical history significant of prostate CA, CVA, HTN. Presenting with N/V, umbilical abdominal pain. Symptoms started yesterday. Stabbing, episodic, non-radiating umbilical pain. When he would have the pain, he would become diaphoretic and have N/V. This continued throughout the night. He has been unable to tolerate PO since. He also complains of LUE swelling after working in the garden a few days ago. He is not sure if he had a bite. However, his arm became itchy and swollen after working in the garden. This worsened over the next several days. He tried benadryl and cortisone. It helped the itching, but not the redness and erythema. He denies any other aggravating or alleviating factors.       Hospital Course by Problem:   Partial small bowel obstruction versus ileus -Significantly improved -Abdominal x-ray done yesterday showed partial SBO -having BMs and tolerating advancement of diet -discussed with general surgery   Retroperitoneal lymphadenopathy -Seen on CT abdomen/pelvis -Patient has history of prostate cancer -Called and discussed with oncologist Dr. Chryl Heck, she recommended to  obtain CT chest -CT chest showed no primary  -PSA was less than 0.01, LDH-111 -Oncology has seen the patient, recommend to follow-up as outpatient for further work-up with PET/CT and biopsy   Left upper extremity cellulitis -Resolved -Venous duplex of upper left extremity is negative   Hypertension -restart home medications   Hypokalemia -replete to 4      Medical Consultants:    General surgery   Discharge Exam:   Vitals:   11/14/20 0508 11/14/20 1233  BP: (!) 148/72 (!) 155/85  Pulse: 67 (!) 55  Resp: 20 18  Temp: 98.1 F (36.7 C) 97.8 F (36.6 C)  SpO2: 95% 96%   Vitals:   11/13/20 0619 11/13/20 1951 11/14/20 0508 11/14/20 1233  BP: (!) 143/82 (!) 150/82 (!) 148/72 (!) 155/85  Pulse: 69 68 67 (!) 55  Resp: '16 20 20 18  '$ Temp: 98.6 F (37 C) 98.5 F (36.9 C) 98.1 F (36.7 C) 97.8 F (36.6 C)  TempSrc: Oral Oral Oral Oral  SpO2: 91% 93% 95% 96%  Weight:      Height:        General exam: Appears calm and comfortable.   The results of significant diagnostics from this hospitalization (including imaging, microbiology, ancillary and laboratory) are listed below for reference.     Procedures and Diagnostic Studies:   CT CHEST W CONTRAST  Result Date: 11/12/2020 CLINICAL DATA:  Retroperitoneal adenopathy suspicious for metastatic disease. Cancer of unknown primary. Staging. EXAM: CT CHEST WITH CONTRAST TECHNIQUE: Multidetector CT imaging of the chest was performed during intravenous contrast administration. CONTRAST:  4m OMNIPAQUE IOHEXOL 350 MG/ML SOLN  COMPARISON:  One-view chest x-ray 11/04/2015 FINDINGS: Cardiovascular: Coronary artery calcifications are present. Heart size is normal. Atherosclerotic calcifications are present in the aorta and branch vessel origins without aneurysm or stenosis. Pulmonary artery there is a pulmonary arteries are within normal limits. Mediastinum/Nodes: No significant mediastinal, hilar, or axillary adenopathy is present.  Heterogeneous thyroid present without dominant nodule. Not clinically significant; no follow-up imaging recommendedesophagus is unremarkable. Lungs/Pleura: Small peripheral high density nodules are present in the right upper thyroid best seen on image 53 of image 5. No other focal nodules are present. Small effusions are present, left greater than right. Associated dependent atelectasis is noted. Airways are patent. Upper Abdomen: Stomach is distended as before. Renal cysts are partially visualized. Cholecystectomy noted. Musculoskeletal: Vertebral body heights and alignment are normal. IMPRESSION: 1. No evidence for primary malignancy in the chest. 2. Coronary artery disease. 3. Small peripheral high density nodules in the right upper lobe are likely post infectious. No follow-up recommended. 4. Aortic Atherosclerosis (ICD10-I70.0). Electronically Signed   By: San Morelle M.D.   On: 11/12/2020 14:31   CT ABDOMEN PELVIS W CONTRAST  Result Date: 11/11/2020 CLINICAL DATA:  RIGHT-side abdominal pain since yesterday, history prostate cancer EXAM: CT ABDOMEN AND PELVIS WITH CONTRAST TECHNIQUE: Multidetector CT imaging of the abdomen and pelvis was performed using the standard protocol following bolus administration of intravenous contrast. CONTRAST:  61m OMNIPAQUE IOHEXOL 350 MG/ML SOLN IV. No oral contrast. COMPARISON:  01/05/2015 FINDINGS: Lower chest: Dependent bibasilar atelectasis Hepatobiliary: Gallbladder surgically absent. Intrahepatic biliary dilatation slightly increased from previous exam. CBD normal caliber without calcification. No focal hepatic mass. Pancreas: Normal appearance Spleen: Normal appearance Adrenals/Urinary Tract: Adrenal glands normal appearance. Multiple renal cysts bilaterally. No hydronephrosis or hydroureter. Bladder unremarkable. Stomach/Bowel: Appendix surgically absent by history. Sigmoid anastomosis. No colonic abnormalities. Distended stomach. Scattered dilated proximal  mid small bowel loops with intervening area of normal caliber. Decompressed distal small bowel loops. Questionable wall thickening of a small bowel loop in the central pelvis versus artifact from underdistention, cannot exclude enteritis. A discrete point of bowel obstruction/transition zone is not identified. Vascular/Lymphatic: Extensive atherosclerotic calcifications aorta, iliac arteries, visceral arteries, coronary arteries. Aorta normal caliber. LEFT para-aortic adenopathy, new, including 27 mm short axis node image 40, 14 mm short axis node image 44, and 15 mm short axis node image 45. Enlarged aortocaval nodes 19 mm image 40 and 21 mm image 33. Additional adenopathy anterior to the aorta and aortocaval region up to 15 mm. Multiple normal to upper normal size mesenteric lymph nodes. Enlarged retrocrural node 15 mm image 19 previously 16 mm. Reproductive: Brachytherapy seed implants within prostate gland/bed. Seminal vesicles unremarkable. Other: Small LEFT inguinal hernia containing fat. No free air or free fluid. Musculoskeletal: Osseous demineralization. Scattered enthesopathy. Mild sclerosis at RIGHT SI joint. Lytic lesion L2 vertebral body unchanged. Arachnoid cyst versus Tarlov cyst S2 sacral canal unchanged. Degenerative disc disease changes lumbar spine with chronic superior endplate compression deformity L1 unchanged. IMPRESSION: Questionable wall thickening of a small bowel loop in the central pelvis versus artifact from underdistention, cannot exclude enteritis. Scattered small bowel loops appear dilated though a segment of mid small bowel and the distal small bowel appear normal in caliber, with no discrete transition zone identified. New retroperitoneal adenopathy most consistent with metastatic disease though this can also be seen with lymphoma. Increase in intrahepatic biliary dilatation post cholecystectomy, recommend correlation with LFTs. Small LEFT inguinal hernia containing fat. Aortic  Atherosclerosis (ICD10-I70.0). Electronically Signed   By: MLavonia Dana  M.D.   On: 11/11/2020 12:34   DG Abd Portable 1V  Result Date: 11/12/2020 CLINICAL DATA:  Abdominal pain with nausea and vomiting EXAM: PORTABLE ABDOMEN - 1 VIEW COMPARISON:  CT from yesterday FINDINGS: Continued gas dilated small bowel loops measuring up to 4 cm in diameter. Moderate gaseous distention of the stomach. Similar degree of colonic gas and stool. Postoperative abdomen with pelvic bowel sutures. Prostate brachytherapy seeds. IMPRESSION: Partial small bowel obstruction. No significant change from yesterday. Electronically Signed   By: Monte Fantasia M.D.   On: 11/12/2020 08:35   UE Venous Duplex ([facility] and [facility] ONLY)  Result Date: 11/11/2020 UPPER VENOUS STUDY  Patient Name:  Jose Ware [facility]  Date of Exam:   11/11/2020 Medical Rec #: [id number]           Accession #:    [id number] Date of Birth: 1934/05/21           Patient Gender: M Patient Age:   47 years Exam Location:  [facility] Procedure:      VAS [place] UPPER EXTREMITY VENOUS DUPLEX Referring Phys: Wynona Dove --------------------------------------------------------------------------------  Indications: Edema, and Erythema Comparison Study: No prior study Performing Technologist: Maudry Mayhew MHA, RDMS, RVT, RDCS  Examination Guidelines: A complete evaluation includes B-mode imaging, spectral Doppler, color Doppler, and power Doppler as needed of all accessible portions of each vessel. Bilateral testing is considered an integral part of a complete examination. Limited examinations for reoccurring indications may be performed as noted.  Right Findings: +----------+------------+---------+-----------+----------+-------+ RIGHT     CompressiblePhasicitySpontaneousPropertiesSummary +----------+------------+---------+-----------+----------+-------+ Subclavian               Yes       Yes                       +----------+------------+---------+-----------+----------+-------+  Left Findings: +----------+------------+---------+-----------+----------+-------+ LEFT      CompressiblePhasicitySpontaneousPropertiesSummary +----------+------------+---------+-----------+----------+-------+ IJV           Full       Yes       Yes                      +----------+------------+---------+-----------+----------+-------+ Subclavian    Full       Yes       Yes                      +----------+------------+---------+-----------+----------+-------+ Axillary      Full       Yes       Yes                      +----------+------------+---------+-----------+----------+-------+ Brachial      Full       Yes       Yes                      +----------+------------+---------+-----------+----------+-------+ Radial        Full                                          +----------+------------+---------+-----------+----------+-------+ Ulnar         Full                                          +----------+------------+---------+-----------+----------+-------+  Cephalic      Full                                          +----------+------------+---------+-----------+----------+-------+ Basilic       Full                                          +----------+------------+---------+-----------+----------+-------+  Summary:  Right: No evidence of thrombosis in the subclavian.  Left: No evidence of deep vein thrombosis in the upper extremity. No evidence of superficial vein thrombosis in the upper extremity.  *See table(s) above for measurements and observations.  Diagnosing physician: Deitra Mayo MD Electronically signed by Deitra Mayo MD on 11/11/2020 at 1:54:54 PM.    Final      Labs:   Basic Metabolic Panel: Recent Labs  Lab 11/11/20 0925 11/12/20 0436 11/13/20 0552 11/14/20 0511  NA 141 145 145 143  K 4.1 3.8 2.8* 3.7  CL 104 109 109 115*  CO2 '26 26 25 24   '$ GLUCOSE 183* 163* 111* 100*  BUN 39* 45* 53* 34*  CREATININE 1.03 1.20 1.02 0.82  CALCIUM 9.8 9.4 8.1* 7.8*  MG  --   --  1.8  --    GFR Estimated Creatinine Clearance: 75.8 mL/min (by C-G formula based on SCr of 0.82 mg/dL). Liver Function Tests: Recent Labs  Lab 11/11/20 0925 11/12/20 0436 11/13/20 0552  AST 15 18 11*  ALT '12 14 11  '$ ALKPHOS 73 54 40  BILITOT 1.6* 1.4* 1.0  PROT 7.4 6.7 5.7*  ALBUMIN 4.0 3.5 2.8*   Recent Labs  Lab 11/11/20 0925  LIPASE 28   No results for input(s): AMMONIA in the last 168 hours. Coagulation profile No results for input(s): INR, PROTIME in the last 168 hours.  CBC: Recent Labs  Lab 11/11/20 0925 11/12/20 0436 11/13/20 0449  WBC 16.8* 7.7 7.5  HGB 14.5 14.6 12.9*  HCT 43.5 43.5 39.3  MCV 98.4 96.9 98.3  PLT 254 235 212   Cardiac Enzymes: Recent Labs  Lab 11/11/20 0925  CKTOTAL 13*   BNP: Invalid input(s): POCBNP CBG: No results for input(s): GLUCAP in the last 168 hours. D-Dimer No results for input(s): DDIMER in the last 72 hours. Hgb A1c No results for input(s): HGBA1C in the last 72 hours. Lipid Profile No results for input(s): CHOL, HDL, LDLCALC, TRIG, CHOLHDL, LDLDIRECT in the last 72 hours. Thyroid function studies No results for input(s): TSH, T4TOTAL, T3FREE, THYROIDAB in the last 72 hours.  Invalid input(s): FREET3 Anemia work up No results for input(s): VITAMINB12, FOLATE, FERRITIN, TIBC, IRON, RETICCTPCT in the last 72 hours. Microbiology Recent Results (from the past 240 hour(s))  Culture, blood (single)     Status: None (Preliminary result)   Collection Time: 11/11/20 11:45 AM   Specimen: BLOOD  Result Value Ref Range Status   Specimen Description   Final    BLOOD BLOOD RIGHT FOREARM Performed at [facility] [address], [place], [place] [address]    Special Requests   Final    BOTTLES DRAWN AEROBIC AND ANAEROBIC Blood Culture adequate volume Performed at [facility] [address], [place], [place] [address]    Culture   Final    NO GROWTH 3 DAYS Performed at  [facility], [place] [address], [place], [place] [address]    Report Status PENDING  Incomplete  Resp Panel by RT-PCR (Flu A&B, Covid) Nasopharyngeal Swab     Status: None   Collection Time: 11/11/20  3:50 PM   Specimen: Nasopharyngeal Swab; Nasopharyngeal(NP) swabs in vial transport medium  Result Value Ref Range Status   SARS Coronavirus 2 by RT PCR NEGATIVE NEGATIVE Final    Comment: (NOTE) SARS-CoV-2 target nucleic acids are NOT DETECTED.  The SARS-CoV-2 RNA is generally detectable in upper respiratory specimens during the acute phase of infection. The lowest concentration of SARS-CoV-2 viral copies this assay can detect is 138 copies/mL. A negative result does not preclude SARS-Cov-2 infection and should not be used as the sole basis for treatment or other patient management decisions. A negative result may occur with  improper specimen collection/handling, submission of specimen other than nasopharyngeal swab, presence of viral mutation(s) within the areas targeted by this assay, and inadequate number of viral copies(<138 copies/mL). A negative result must be combined with clinical observations, patient history, and epidemiological information. The expected result is Negative.  Fact Sheet for Patients:  EntrepreneurPulse.com.au  Fact Sheet for Healthcare Providers:  IncredibleEmployment.be  This test is no t yet approved or cleared by the [place] FDA and  has been authorized for detection and/or diagnosis of SARS-CoV-2 by FDA under an Emergency Use Authorization (EUA). This EUA will remain  in effect (meaning this test can be used) for the duration of the COVID-19 declaration under Section 564(b)(1) of the Act, 21 U.S.C.section 360bbb-3(b)(1), unless the authorization is terminated  or revoked sooner.        Influenza A by PCR NEGATIVE NEGATIVE Final   Influenza B by PCR NEGATIVE NEGATIVE Final    Comment: (NOTE) The Xpert Xpress SARS-CoV-2/FLU/RSV plus assay is intended as an aid in the diagnosis of influenza from Nasopharyngeal swab specimens and should not be used as a sole basis for treatment. Nasal washings and aspirates are unacceptable for Xpert Xpress SARS-CoV-2/FLU/RSV testing.  Fact Sheet for Patients: EntrepreneurPulse.com.au  Fact Sheet for Healthcare Providers: IncredibleEmployment.be  This test is not yet approved or cleared by the [place] FDA and has been authorized for detection and/or diagnosis of SARS-CoV-2 by FDA under an Emergency Use Authorization (EUA). This EUA will remain in effect (meaning this test can be used) for the duration of the COVID-19 declaration under Section 564(b)(1) of the Act, 21 U.S.C. section 360bbb-3(b)(1), unless the authorization is terminated or revoked.  Performed at [facility], [place] [address], [place], [place] [address]      Discharge Instructions:   Discharge Instructions     Discharge instructions   Complete by: As directed    Oncology will arrange for outpatient PET/CT and biopsy and follow-up with hematology oncology.  Most likely this is a very slow-growing disease process Full liquid diet and slowly advance to soft/low residue foods Bowel regimen: can use miralax BID (titrate to "toothpaste" consistency stools)   Increase activity slowly   Complete by: As directed       Allergies as of 11/14/2020       Reactions   Bee Venom Swelling   Facial swelling   Betadine [povidone Iodine] Other (See Comments)   blistering   Doxycycline    Possible drug rash- back of right lower leg and onto left lower leg as well        Medication List     TAKE these medications  acetaminophen 650 MG CR tablet Commonly known as: TYLENOL Take 650 mg by mouth 2 (two) times  daily as needed for pain (headache).   amLODipine 5 MG tablet Commonly known as: NORVASC Take 1 tablet by mouth once daily What changed: when to take this   aspirin EC 81 MG tablet Take 1 tablet (81 mg total) by mouth daily.   atorvastatin 20 MG tablet Commonly known as: LIPITOR Take 1 tablet (20 mg total) by mouth daily. What changed: when to take this   beta carotene w/minerals tablet Take 1 tablet by mouth every morning.   carvedilol 3.125 MG tablet Commonly known as: COREG Take 1 tablet (3.125 mg total) by mouth 2 (two) times daily. What changed: when to take this   losartan 100 MG tablet Commonly known as: COZAAR Take 1 tablet by mouth once daily What changed: when to take this   nitroGLYCERIN 0.4 MG SL tablet Commonly known as: NITROSTAT Place 1 tablet (0.4 mg total) under the tongue every 5 (five) minutes x 3 doses as needed for chest pain.   spironolactone 25 MG tablet Commonly known as: ALDACTONE Take 1 tablet by mouth once daily What changed: when to take this   Vitamin B-12 5000 MCG Tbdp Take 5,000 mcg by mouth every morning.        Follow-up Information     [name], MD. Schedule an appointment as soon as possible for a visit in 2 days.   Specialty: Family Medicine Why: Follow up from ER visit Contact information: [place] [address] [phone]         Benay Pike, MD. Schedule an appointment as soon as possible for a visit in 2 days.   Specialty: Hematology and Oncology Why: Follow up from ER visit Contact information: [place] [address] [phone]         [facility], [facility] Follow up.   Specialty: Home Health Services Why: They will call to set up an appointment for their first visit. Contact information: [address] [place] [address] [phone]                  Time coordinating discharge: 35 min  Signed:  Geradine Girt DO  [facility] 11/14/2020, 1:54 PM

## 2020-11-15 ENCOUNTER — Telehealth: Payer: Self-pay

## 2020-11-15 NOTE — Telephone Encounter (Cosign Needed)
Transition Care Management Follow-up Telephone Call Date of discharge and from where: [name] 11/14/20 How have you been since you were released from the hospital? Doing good Any questions or concerns? No  Items Reviewed: Did the pt receive and understand the discharge instructions provided? Yes  Medications obtained and verified? Yes  Other? No  Any new allergies since your discharge? No  Dietary orders reviewed? Yes Do you have support at home? Yes   Home Care and Equipment/Supplies: Were home health services ordered? not applicable If so, what is the name of the agency?   Has the agency set up a time to come to the patient's home? not applicable Were any new equipment or medical supplies ordered?  No What is the name of the medical supply agency?  Were you able to get the supplies/equipment? not applicable Do you have any questions related to the use of the equipment or supplies? No  Functional Questionnaire: (I = Independent and D = Dependent) ADLs: I  Bathing/Dressing- I  Meal Prep- I  Eating- I  Maintaining continence- I  Transferring/Ambulation- I  Managing Meds- I  Follow up appointments reviewed:  PCP Hospital f/u appt confirmed? No   Specialist Hospital f/u appt  confirmed? Yes  Scheduled to see Dr Chryl Heck on 11/28/20. Are transportation arrangements needed? No  If their condition worsens, is the pt aware to call PCP or go to the Emergency Dept.? Yes Was the patient provided with contact information for the PCP's office or ED? Yes Was to pt encouraged to call back with questions or concerns? Yes

## 2020-11-16 LAB — CULTURE, BLOOD (SINGLE)
Culture: NO GROWTH
Special Requests: ADEQUATE

## 2020-11-20 ENCOUNTER — Telehealth: Payer: Self-pay

## 2020-11-20 DIAGNOSIS — I10 Essential (primary) hypertension: Secondary | ICD-10-CM | POA: Diagnosis not present

## 2020-11-20 DIAGNOSIS — E876 Hypokalemia: Secondary | ICD-10-CM | POA: Diagnosis not present

## 2020-11-20 DIAGNOSIS — I700 Atherosclerosis of aorta: Secondary | ICD-10-CM | POA: Diagnosis not present

## 2020-11-20 DIAGNOSIS — K5669 Other partial intestinal obstruction: Secondary | ICD-10-CM | POA: Diagnosis not present

## 2020-11-20 DIAGNOSIS — I2510 Atherosclerotic heart disease of native coronary artery without angina pectoris: Secondary | ICD-10-CM | POA: Diagnosis not present

## 2020-11-20 NOTE — Telephone Encounter (Signed)
Called and lm on Shawn vm with VO.

## 2020-11-20 NOTE — Telephone Encounter (Signed)
.  Home Health verbal orders [name]  Agency Name: [facility] number: [phone] OK TO LVM   Requesting OT/PT/Skilled nursing/Social Work/Speech:PT     Reason: fall risk , low endurance   Frequency:2x a week for 3 weeks 1x a week for 2 weeks  Please forward to [facility] pool or providers CMA

## 2020-11-23 DIAGNOSIS — K5669 Other partial intestinal obstruction: Secondary | ICD-10-CM | POA: Diagnosis not present

## 2020-11-23 DIAGNOSIS — I700 Atherosclerosis of aorta: Secondary | ICD-10-CM | POA: Diagnosis not present

## 2020-11-23 DIAGNOSIS — I10 Essential (primary) hypertension: Secondary | ICD-10-CM | POA: Diagnosis not present

## 2020-11-23 DIAGNOSIS — E876 Hypokalemia: Secondary | ICD-10-CM | POA: Diagnosis not present

## 2020-11-23 DIAGNOSIS — I2510 Atherosclerotic heart disease of native coronary artery without angina pectoris: Secondary | ICD-10-CM | POA: Diagnosis not present

## 2020-11-24 ENCOUNTER — Other Ambulatory Visit: Payer: Self-pay | Admitting: Cardiology

## 2020-11-27 ENCOUNTER — Telehealth: Payer: Self-pay

## 2020-11-27 DIAGNOSIS — G4733 Obstructive sleep apnea (adult) (pediatric): Secondary | ICD-10-CM | POA: Diagnosis not present

## 2020-11-27 DIAGNOSIS — I2510 Atherosclerotic heart disease of native coronary artery without angina pectoris: Secondary | ICD-10-CM | POA: Diagnosis not present

## 2020-11-27 DIAGNOSIS — I700 Atherosclerosis of aorta: Secondary | ICD-10-CM | POA: Diagnosis not present

## 2020-11-27 DIAGNOSIS — K5669 Other partial intestinal obstruction: Secondary | ICD-10-CM | POA: Diagnosis not present

## 2020-11-27 DIAGNOSIS — I10 Essential (primary) hypertension: Secondary | ICD-10-CM | POA: Diagnosis not present

## 2020-11-27 DIAGNOSIS — E876 Hypokalemia: Secondary | ICD-10-CM | POA: Diagnosis not present

## 2020-11-27 NOTE — Telephone Encounter (Signed)
Can you write this on a Rx pad and fax the order to [facility] please?

## 2020-11-27 NOTE — Progress Notes (Deleted)
[place] NOTE  Patient Care Team: [name], MD as PCP - General (Family Medicine) Martinique, Peter M, MD as PCP - Cardiology (Cardiology) Lowella Bandy, MD (Inactive) (Urology) Ladene Artist, MD (Gastroenterology) Martinique, Peter M, MD as Consulting Physician (Cardiology) Keene Breath., MD as Consulting Physician (Ophthalmology) Madelin Rear, [facility] as Pharmacist (Pharmacist) Festus Aloe, MD as Consulting Physician (Urology)  CHIEF COMPLAINTS/PURPOSE OF CONSULTATION:  Retroperitoneal lymphadenopathy  ASSESSMENT & PLAN:  No problem-specific Assessment & Plan notes found for this encounter.  No orders of the defined types were placed in this encounter.    HISTORY OF PRESENTING ILLNESS:  Jose Ware 86 y.o. male is here because of retroperitoneal lymphadenopathy  This is a very pleasant 86 year old male patient whom I visited inpatient with past medical history significant for prostate cancer several years ago, hypertension who was admitted with chief complaint of abdominal pain and inability to have a bowel movement.  He also had nausea vomiting and diaphoresis at this time.  He had CT imaging while he was inpatient which showed questionable wall thickening of the small bowel loop in the central pelvis versus artifact from underdistention.  There was new retroperitoneal adenopathy most consistent with metastatic disease although this can also be seen with lymphoma.  Medical oncology was consulted while he was inpatient.  He absolutely denied any new symptoms.  He was very healthy prior to this hospital admission.  CT chest with no evidence of primary malignancy. CBC was perfectly normal except for mild anemia.  I recommended outpatient consultation for consideration of PET/CT and further biopsy as needed.  REVIEW OF SYSTEMS:   Constitutional: Denies fevers, chills or abnormal night sweats Eyes: Denies blurriness of vision, double vision or watery  eyes Ears, nose, mouth, throat, and face: Denies mucositis or sore throat Respiratory: Denies cough, dyspnea or wheezes Cardiovascular: Denies palpitation, chest discomfort or lower extremity swelling Gastrointestinal:  Denies nausea, heartburn or change in bowel habits Skin: Denies abnormal skin rashes Lymphatics: Denies new lymphadenopathy or easy bruising Neurological:Denies numbness, tingling or new weaknesses Behavioral/Psych: Mood is stable, no new changes  All other systems were reviewed with the patient and are negative.  MEDICAL HISTORY:  Past Medical History:  Diagnosis Date   Adenocarcinoma of prostate [facility])    XRT & Radiation seed implantation in 2010   Adenomatous colon polyp    CAD (coronary artery disease)    a. 11/04/15:  Acute inferolateral STEMI: S/p emergent DES of a very large LCx with extensive thrombus. Significant residual disease in the proximal LAD and distal RCA. S/p staged PCI of LAD and RCA 8/29.   Cataract    CVA (cerebral infarction)    a. 06/3152: embolic CVA after heart cath    CVA (cerebral infarction)    a. 0/0867: embolic CVA after heart cath    DIVERTICULITIS, HX OF 11/27/2006        DJD (degenerative joint disease)    wrist - R    Hernia    History of blood transfusion 1985   with colon surgery   Hypertension    Ischemic cardiomyopathy    a. EF 25-30% by LV gram on cath 11/04/15. Appeared out of proportion to infarct although infarct was large. EF improved by Echo and now 40-45%.   Myocardial infarction ([place])    NEPHROLITHIASIS, HX OF 11/27/2006         Peripheral neuropathy    Psoriasis    Tenosynovitis     SURGICAL  HISTORY: Past Surgical History:  Procedure Laterality Date   [place]   minor disk surgery: instrumentation placed and removed in a second procedure   CARDIAC CATHETERIZATION N/A 11/04/2015   Procedure: Left Heart Cath and Coronary Angiography;  Surgeon: Peter M Martinique, MD;  Location: [place] [facility];  Service: Cardiovascular;  Laterality: N/A;   CARDIAC CATHETERIZATION N/A 11/04/2015   Procedure: Coronary Stent Intervention;  Surgeon: Peter M Martinique, MD;  Location: [place] [facility];  Service: Cardiovascular;  Laterality: N/A;   CARDIAC CATHETERIZATION N/A 11/06/2015   Procedure: Coronary Stent Intervention;  Surgeon: Leonie Man, MD;  Location: [place] [facility];  Service: Cardiovascular;  Laterality: N/A;   CATARACT EXTRACTION, BILATERAL  2013   CHOLECYSTECTOMY  1986   COLON SURGERY  1980's   pt. had ileus, had colon surgery, requiring colostomy & then reversal & then dehisence of that wound & return to OR for repair & cholecystectomy     COLONOSCOPY     COLOSTOMY  1985   After colectomy for diverticulitis, pt. remarks during this surgery they "gave me the paddles two times  because of bleeding", pt. states it was unrelated to any anesthesia complication   EYE SURGERY     /w IOL   KNEE ARTHROSCOPY Left 2003   [place], Right w/cartilage removed later   PARATHYROIDECTOMY N/A 05/12/2014   Procedure: PARATHYROIDECTOMY;  Surgeon: Armandina Gemma, MD;  Location: [place];  Service: General;  Laterality: N/A;   REVERSAL OF COLOSTOMY  1987   TONSILLECTOMY  1946   WRIST SURGERY Left    Remote complicated w/infection    SOCIAL HISTORY: Social History   Socioeconomic History   Marital status: Married    Spouse name: Not on file   Number of children: 3   Years of education: Not on file   Highest education level: Not on file  Occupational History   Not on file  Tobacco Use   Smoking status: Former    Packs/day: 2.00    Years: 37.00    Pack years: 74.00    Types: Cigarettes    Quit date: 03/11/1983    Years since quitting: 37.7   Smokeless tobacco: Never   Tobacco comments:    started smoking in the service; ICU 85' part of his stomach; appendix; coma   Vaping Use   Vaping Use: Never used  Substance and Sexual Activity   Alcohol use: Yes     Alcohol/week: 0.0 standard drinks    Comment: RARE   Drug use: No   Sexual activity: Not on file  Other Topics Concern   Not on file  Social History Narrative   [place], [place].   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      Hobbies: fishing, busy volunteering            Social Determinants of Radio broadcast assistant Strain: Low Risk    Difficulty of Paying Living Expenses: Not hard at all  Food Insecurity: No [place]   Worried About Charity fundraiser in the Last Year: Never  true   Ran Out of Food in the Last Year: Never true  Transportation Needs: No Transportation Needs   Lack of Transportation (Medical): No   Lack of Transportation (Non-Medical): No  Physical Activity: Sufficiently Active   Days of Exercise per Week: 4 days   Minutes of Exercise per Session: 60 min  Stress: No Stress Concern Present   Feeling of Stress : Not at all  Social Connections: Moderately Integrated   Frequency of Communication with Friends and Family: Once a week   Frequency of Social Gatherings with Friends and Family: Once a week   Attends Religious Services: More than 4 times per year   Active Member of [employer] or Organizations: Yes   Attends Music therapist: 1 to 4 times per year   Marital Status: Married  Human resources officer Violence: Not At Risk   Fear of Current or Ex-Partner: No   Emotionally Abused: No   Physically Abused: No   Sexually Abused: No    FAMILY HISTORY: Family History  Problem Relation Age of Onset   Coronary artery disease Mother    Alcohol abuse Father    Cirrhosis Father    Heart failure Sister    Breast cancer Sister        W/involvement of right arm leading to  amputation   Coronary artery disease Brother    Heart attack Brother    Non-Hodgkin's lymphoma Brother    Clotting disorder Brother    Non-Hodgkin's lymphoma Brother    Anemia Sister        related to treatment to lymphoma   Non-Hodgkin's lymphoma Sister    Prostate cancer Neg Hx    Colon cancer Neg Hx    Diabetes Neg Hx    Glaucoma Neg Hx    Rectal cancer Neg Hx    Esophageal cancer Neg Hx     ALLERGIES:  is allergic to bee venom, betadine [povidone iodine], and doxycycline.  MEDICATIONS:  Current Outpatient Medications  Medication Sig Dispense Refill   acetaminophen (TYLENOL) 650 MG CR tablet Take 650 mg by mouth 2 (two) times daily as needed for pain (headache).     amLODipine (NORVASC) 5 MG tablet Take 1 tablet by mouth once daily (Patient taking differently: Take 5 mg by mouth daily with supper.) 90 tablet 0   aspirin EC 81 MG tablet Take 1 tablet (81 mg total) by mouth daily. 90 tablet 1   atorvastatin (LIPITOR) 20 MG tablet Take 1 tablet (20 mg total) by mouth daily. (Patient taking differently: Take 20 mg by mouth daily with supper.) 90 tablet 3   beta carotene w/minerals (OCUVITE) tablet Take 1 tablet by mouth every morning.     carvedilol (COREG) 3.125 MG tablet Take 1 tablet by mouth twice daily 180 tablet 3   Cyanocobalamin (VITAMIN B-12) 5000 MCG TBDP Take 5,000 mcg by mouth every morning.     losartan (COZAAR) 100 MG tablet Take 1 tablet by mouth once daily (Patient taking differently: Take 100 mg by mouth every morning.) 90 tablet 3   nitroGLYCERIN (NITROSTAT) 0.4 MG SL tablet Place 1 tablet (0.4 mg total) under the tongue every 5 (five) minutes x 3 doses as needed for chest pain. (Patient not taking: No sig reported) 25 tablet 2   spironolactone (ALDACTONE) 25 MG tablet Take 1 tablet by mouth once daily (Patient taking differently: Take 25 mg by mouth every morning.) 90 tablet 1   No current facility-administered medications for this visit.  PHYSICAL  EXAMINATION: ECOG PERFORMANCE STATUS: {CHL ONC ECOG PS:[phone]}  There were no vitals filed for this visit. There were no vitals filed for this visit.  GENERAL:alert, no distress and comfortable SKIN: skin color, texture, turgor are normal, no rashes or significant lesions EYES: normal, conjunctiva are pink and non-injected, sclera clear OROPHARYNX:no exudate, no erythema and lips, buccal mucosa, and tongue normal  NECK: supple, thyroid normal size, non-tender, without nodularity LYMPH:  no palpable lymphadenopathy in the cervical, axillary or inguinal LUNGS: clear to auscultation and percussion with normal breathing effort HEART: regular rate & rhythm and no murmurs and no lower extremity edema ABDOMEN:abdomen soft, non-tender and normal bowel sounds Musculoskeletal:no cyanosis of digits and no clubbing  PSYCH: alert & oriented x 3 with fluent speech NEURO: no focal motor/sensory deficits  LABORATORY DATA:  I have reviewed the data as listed Lab Results  Component Value Date   WBC 7.5 11/13/2020   HGB 12.9 (L) 11/13/2020   HCT 39.3 11/13/2020   MCV 98.3 11/13/2020   PLT 212 11/13/2020     Chemistry      Component Value Date/Time   NA 143 11/14/2020 0511   NA 140 05/06/2018 1103   K 3.7 11/14/2020 0511   CL 115 (H) 11/14/2020 0511   CO2 24 11/14/2020 0511   BUN 34 (H) 11/14/2020 0511   BUN 27 05/06/2018 1103   CREATININE 0.82 11/14/2020 0511   CREATININE 1.10 12/23/2019 1204      Component Value Date/Time   CALCIUM 7.8 (L) 11/14/2020 0511   ALKPHOS 40 11/13/2020 0552   AST 11 (L) 11/13/2020 0552   ALT 11 11/13/2020 0552   BILITOT 1.0 11/13/2020 0552   BILITOT 1.2 05/06/2018 1103     PSA was normal.  LDH was normal.  RADIOGRAPHIC STUDIES: I have personally reviewed the radiological images as listed and agreed with the findings in the report. CT CHEST W CONTRAST  Result Date: 11/12/2020 CLINICAL DATA:  Retroperitoneal adenopathy suspicious for metastatic  disease. Cancer of unknown primary. Staging. EXAM: CT CHEST WITH CONTRAST TECHNIQUE: Multidetector CT imaging of the chest was performed during intravenous contrast administration. CONTRAST:  81mL OMNIPAQUE IOHEXOL 350 MG/ML SOLN COMPARISON:  One-view chest x-ray 11/04/2015 FINDINGS: Cardiovascular: Coronary artery calcifications are present. Heart size is normal. Atherosclerotic calcifications are present in the aorta and branch vessel origins without aneurysm or stenosis. Pulmonary artery there is a pulmonary arteries are within normal limits. Mediastinum/Nodes: No significant mediastinal, hilar, or axillary adenopathy is present. Heterogeneous thyroid present without dominant nodule. Not clinically significant; no follow-up imaging recommendedesophagus is unremarkable. Lungs/Pleura: Small peripheral high density nodules are present in the right upper thyroid best seen on image 53 of image 5. No other focal nodules are present. Small effusions are present, left greater than right. Associated dependent atelectasis is noted. Airways are patent. Upper Abdomen: Stomach is distended as before. Renal cysts are partially visualized. Cholecystectomy noted. Musculoskeletal: Vertebral body heights and alignment are normal. IMPRESSION: 1. No evidence for primary malignancy in the chest. 2. Coronary artery disease. 3. Small peripheral high density nodules in the right upper lobe are likely post infectious. No follow-up recommended. 4. Aortic Atherosclerosis (ICD10-I70.0). Electronically Signed   By: San Morelle M.D.   On: 11/12/2020 14:31   CT ABDOMEN PELVIS W CONTRAST  Result Date: 11/11/2020 CLINICAL DATA:  RIGHT-side abdominal pain since yesterday, history prostate cancer EXAM: CT ABDOMEN AND PELVIS WITH CONTRAST TECHNIQUE: Multidetector CT imaging of the abdomen and pelvis was performed using the  standard protocol following bolus administration of intravenous contrast. CONTRAST:  31mL OMNIPAQUE IOHEXOL 350  MG/ML SOLN IV. No oral contrast. COMPARISON:  01/05/2015 FINDINGS: Lower chest: Dependent bibasilar atelectasis Hepatobiliary: Gallbladder surgically absent. Intrahepatic biliary dilatation slightly increased from previous exam. CBD normal caliber without calcification. No focal hepatic mass. Pancreas: Normal appearance Spleen: Normal appearance Adrenals/Urinary Tract: Adrenal glands normal appearance. Multiple renal cysts bilaterally. No hydronephrosis or hydroureter. Bladder unremarkable. Stomach/Bowel: Appendix surgically absent by history. Sigmoid anastomosis. No colonic abnormalities. Distended stomach. Scattered dilated proximal mid small bowel loops with intervening area of normal caliber. Decompressed distal small bowel loops. Questionable wall thickening of a small bowel loop in the central pelvis versus artifact from underdistention, cannot exclude enteritis. A discrete point of bowel obstruction/transition zone is not identified. Vascular/Lymphatic: Extensive atherosclerotic calcifications aorta, iliac arteries, visceral arteries, coronary arteries. Aorta normal caliber. LEFT para-aortic adenopathy, new, including 27 mm short axis node image 40, 14 mm short axis node image 44, and 15 mm short axis node image 45. Enlarged aortocaval nodes 19 mm image 40 and 21 mm image 33. Additional adenopathy anterior to the aorta and aortocaval region up to 15 mm. Multiple normal to upper normal size mesenteric lymph nodes. Enlarged retrocrural node 15 mm image 19 previously 16 mm. Reproductive: Brachytherapy seed implants within prostate gland/bed. Seminal vesicles unremarkable. Other: Small LEFT inguinal hernia containing fat. No free air or free fluid. Musculoskeletal: Osseous demineralization. Scattered enthesopathy. Mild sclerosis at RIGHT SI joint. Lytic lesion L2 vertebral body unchanged. Arachnoid cyst versus Tarlov cyst S2 sacral canal unchanged. Degenerative disc disease changes lumbar spine with chronic  superior endplate compression deformity L1 unchanged. IMPRESSION: Questionable wall thickening of a small bowel loop in the central pelvis versus artifact from underdistention, cannot exclude enteritis. Scattered small bowel loops appear dilated though a segment of mid small bowel and the distal small bowel appear normal in caliber, with no discrete transition zone identified. New retroperitoneal adenopathy most consistent with metastatic disease though this can also be seen with lymphoma. Increase in intrahepatic biliary dilatation post cholecystectomy, recommend correlation with LFTs. Small LEFT inguinal hernia containing fat. Aortic Atherosclerosis (ICD10-I70.0). Electronically Signed   By: Lavonia Dana M.D.   On: 11/11/2020 12:34   DG Abd Portable 1V  Result Date: 11/12/2020 CLINICAL DATA:  Abdominal pain with nausea and vomiting EXAM: PORTABLE ABDOMEN - 1 VIEW COMPARISON:  CT from yesterday FINDINGS: Continued gas dilated small bowel loops measuring up to 4 cm in diameter. Moderate gaseous distention of the stomach. Similar degree of colonic gas and stool. Postoperative abdomen with pelvic bowel sutures. Prostate brachytherapy seeds. IMPRESSION: Partial small bowel obstruction. No significant change from yesterday. Electronically Signed   By: Monte Fantasia M.D.   On: 11/12/2020 08:35   UE Venous Duplex ([facility] and [facility] ONLY)  Result Date: 11/11/2020 UPPER VENOUS STUDY  Patient Name:  KAYMON DENOMME [facility]  Date of Exam:   11/11/2020 Medical Rec #: [id number]           Accession #:    [id number] Date of Birth: Aug 02, 1934           Patient Gender: M Patient Age:   7 years Exam Location:  [facility] Procedure:      VAS [place] UPPER EXTREMITY VENOUS DUPLEX Referring Phys: Wynona Dove --------------------------------------------------------------------------------  Indications: Edema, and Erythema Comparison Study: No prior study Performing Technologist: Maudry Mayhew MHA, RDMS, RVT, RDCS  Examination  Guidelines: A complete evaluation includes B-mode imaging, spectral Doppler, color Doppler, and power  Doppler as needed of all accessible portions of each vessel. Bilateral testing is considered an integral part of a complete examination. Limited examinations for reoccurring indications may be performed as noted.  Right Findings: +----------+------------+---------+-----------+----------+-------+ RIGHT     CompressiblePhasicitySpontaneousPropertiesSummary +----------+------------+---------+-----------+----------+-------+ Subclavian               Yes       Yes                      +----------+------------+---------+-----------+----------+-------+  Left Findings: +----------+------------+---------+-----------+----------+-------+ LEFT      CompressiblePhasicitySpontaneousPropertiesSummary +----------+------------+---------+-----------+----------+-------+ IJV           Full       Yes       Yes                      +----------+------------+---------+-----------+----------+-------+ Subclavian    Full       Yes       Yes                      +----------+------------+---------+-----------+----------+-------+ Axillary      Full       Yes       Yes                      +----------+------------+---------+-----------+----------+-------+ Brachial      Full       Yes       Yes                      +----------+------------+---------+-----------+----------+-------+ Radial        Full                                          +----------+------------+---------+-----------+----------+-------+ Ulnar         Full                                          +----------+------------+---------+-----------+----------+-------+ Cephalic      Full                                          +----------+------------+---------+-----------+----------+-------+ Basilic       Full                                           +----------+------------+---------+-----------+----------+-------+  Summary:  Right: No evidence of thrombosis in the subclavian.  Left: No evidence of deep vein thrombosis in the upper extremity. No evidence of superficial vein thrombosis in the upper extremity.  *See table(s) above for measurements and observations.  Diagnosing physician: Deitra Mayo MD Electronically signed by Deitra Mayo MD on 11/11/2020 at 1:54:54 PM.    Final     All questions were answered. The patient knows to call the clinic with any problems, questions or concerns. I spent *** minutes in the care of this patient including H and P, review of records, counseling and coordination of care.     Benay Pike, MD 11/27/2020 4:34 PM

## 2020-11-27 NOTE — Telephone Encounter (Signed)
Jose Ware from [facility] would like a prescription to order a front wheeled walker. Jose Ware stated that [name] has been barrowing one and he was wondering If [name] can have one of his own. Jose Ware can be reached at [phone]. Please Advise.

## 2020-11-28 ENCOUNTER — Other Ambulatory Visit: Payer: Self-pay | Admitting: Hematology and Oncology

## 2020-11-28 ENCOUNTER — Telehealth: Payer: Self-pay | Admitting: Pulmonary Disease

## 2020-11-28 ENCOUNTER — Inpatient Hospital Stay: Payer: Medicare Other | Admitting: Hematology and Oncology

## 2020-11-28 ENCOUNTER — Telehealth: Payer: Self-pay

## 2020-11-28 DIAGNOSIS — R590 Localized enlarged lymph nodes: Secondary | ICD-10-CM

## 2020-11-28 NOTE — Telephone Encounter (Signed)
I called the patient to set up a visit for follow up on CPAP and left a message.   Patient will need a visit between 12/28/20 through 02/25/21 for a visit with Dr. Halford Chessman for OSA compliance.

## 2020-11-28 NOTE — Telephone Encounter (Signed)
Spoke with patient regarding upcoming CT scan. Informed him of date/time and provided number for Central Scheduling should he need to adjust appt. Also sent scheduling message to set up office visit with Dr Chryl Heck for 1-2 days after scan.

## 2020-11-28 NOTE — Progress Notes (Unsigned)
Will reschedule his appointment to after repeat imaging. Discussed this with patient and his wife.  Jose Ware

## 2020-11-29 ENCOUNTER — Other Ambulatory Visit: Payer: Self-pay | Admitting: Hematology and Oncology

## 2020-11-29 DIAGNOSIS — R590 Localized enlarged lymph nodes: Secondary | ICD-10-CM

## 2020-11-30 DIAGNOSIS — I700 Atherosclerosis of aorta: Secondary | ICD-10-CM | POA: Diagnosis not present

## 2020-11-30 DIAGNOSIS — I2510 Atherosclerotic heart disease of native coronary artery without angina pectoris: Secondary | ICD-10-CM | POA: Diagnosis not present

## 2020-11-30 DIAGNOSIS — E876 Hypokalemia: Secondary | ICD-10-CM | POA: Diagnosis not present

## 2020-11-30 DIAGNOSIS — K5669 Other partial intestinal obstruction: Secondary | ICD-10-CM | POA: Diagnosis not present

## 2020-11-30 DIAGNOSIS — I10 Essential (primary) hypertension: Secondary | ICD-10-CM | POA: Diagnosis not present

## 2020-12-04 ENCOUNTER — Other Ambulatory Visit: Payer: Self-pay

## 2020-12-04 ENCOUNTER — Ambulatory Visit (HOSPITAL_COMMUNITY)
Admission: RE | Admit: 2020-12-04 | Discharge: 2020-12-04 | Disposition: A | Discharge status: Home / Self Care | Payer: Medicare Other | Source: Ambulatory Visit | Attending: Hematology and Oncology | Admitting: Hematology and Oncology

## 2020-12-04 ENCOUNTER — Encounter (HOSPITAL_COMMUNITY): Payer: Self-pay

## 2020-12-04 DIAGNOSIS — R590 Localized enlarged lymph nodes: Secondary | ICD-10-CM | POA: Insufficient documentation

## 2020-12-04 DIAGNOSIS — G911 Obstructive hydrocephalus: Secondary | ICD-10-CM | POA: Diagnosis not present

## 2020-12-04 DIAGNOSIS — N281 Cyst of kidney, acquired: Secondary | ICD-10-CM | POA: Diagnosis not present

## 2020-12-04 DIAGNOSIS — K56609 Unspecified intestinal obstruction, unspecified as to partial versus complete obstruction: Secondary | ICD-10-CM | POA: Diagnosis not present

## 2020-12-04 MED ORDER — IOHEXOL 350 MG/ML SOLN
75.0000 mL | Freq: Once | INTRAVENOUS | Status: AC | PRN
  Administered 2020-12-04: 75 mL via INTRAVENOUS

## 2020-12-05 ENCOUNTER — Inpatient Hospital Stay: Payer: Medicare Other

## 2020-12-05 ENCOUNTER — Encounter: Payer: Self-pay | Admitting: Hematology and Oncology

## 2020-12-05 ENCOUNTER — Inpatient Hospital Stay: Payer: Medicare Other | Attending: Hematology and Oncology | Admitting: Hematology and Oncology

## 2020-12-05 ENCOUNTER — Other Ambulatory Visit: Payer: Self-pay

## 2020-12-05 VITALS — BP 118/44 | HR 67 | Temp 97.6°F | Resp 19 | Ht 73.0 in | Wt 198.7 lb

## 2020-12-05 DIAGNOSIS — R590 Localized enlarged lymph nodes: Secondary | ICD-10-CM

## 2020-12-05 DIAGNOSIS — R5383 Other fatigue: Secondary | ICD-10-CM | POA: Insufficient documentation

## 2020-12-05 DIAGNOSIS — D649 Anemia, unspecified: Secondary | ICD-10-CM | POA: Insufficient documentation

## 2020-12-05 DIAGNOSIS — Z8546 Personal history of malignant neoplasm of prostate: Secondary | ICD-10-CM | POA: Diagnosis not present

## 2020-12-05 LAB — COMPREHENSIVE METABOLIC PANEL
ALT: 11 U/L (ref 0–44)
AST: 10 U/L — ABNORMAL LOW (ref 15–41)
Albumin: 3.2 g/dL — ABNORMAL LOW (ref 3.5–5.0)
Alkaline Phosphatase: 72 U/L (ref 38–126)
Anion gap: 10 (ref 5–15)
BUN: 27 mg/dL — ABNORMAL HIGH (ref 8–23)
CO2: 26 mmol/L (ref 22–32)
Calcium: 9.1 mg/dL (ref 8.9–10.3)
Chloride: 105 mmol/L (ref 98–111)
Creatinine, Ser: 1.37 mg/dL — ABNORMAL HIGH (ref 0.61–1.24)
GFR, Estimated: 51 mL/min — ABNORMAL LOW (ref >60)
Glucose, Bld: 103 mg/dL — ABNORMAL HIGH (ref 70–99)
Potassium: 4.1 mmol/L (ref 3.5–5.1)
Sodium: 141 mmol/L (ref 135–145)
Total Bilirubin: 0.9 mg/dL (ref 0.3–1.2)
Total Protein: 6.4 g/dL — ABNORMAL LOW (ref 6.5–8.1)

## 2020-12-05 LAB — CBC WITH DIFFERENTIAL/PLATELET
Abs Immature Granulocytes: 0.03 10*3/uL (ref 0.00–0.07)
Basophils Absolute: 0 10*3/uL (ref 0.0–0.1)
Basophils Relative: 0 %
Eosinophils Absolute: 0.3 10*3/uL (ref 0.0–0.5)
Eosinophils Relative: 4 %
HCT: 36.3 % — ABNORMAL LOW (ref 39.0–52.0)
Hemoglobin: 12 g/dL — ABNORMAL LOW (ref 13.0–17.0)
Immature Granulocytes: 0 %
Lymphocytes Relative: 17 %
Lymphs Abs: 1.3 10*3/uL (ref 0.7–4.0)
MCH: 32.3 pg (ref 26.0–34.0)
MCHC: 33.1 g/dL (ref 30.0–36.0)
MCV: 97.8 fL (ref 80.0–100.0)
Monocytes Absolute: 0.8 10*3/uL (ref 0.1–1.0)
Monocytes Relative: 12 %
Neutro Abs: 4.9 10*3/uL (ref 1.7–7.7)
Neutrophils Relative %: 67 %
Platelets: 212 10*3/uL (ref 150–400)
RBC: 3.71 MIL/uL — ABNORMAL LOW (ref 4.22–5.81)
RDW: 13.7 % (ref 11.5–15.5)
WBC: 7.3 10*3/uL (ref 4.0–10.5)
nRBC: 0 % (ref 0.0–0.2)

## 2020-12-05 LAB — LACTATE DEHYDROGENASE: LDH: 105 U/L (ref 98–192)

## 2020-12-05 NOTE — Telephone Encounter (Signed)
Prescription has been sent.

## 2020-12-05 NOTE — Progress Notes (Signed)
[place] NOTE  Patient Care Team: [name], MD as PCP - General (Family Medicine) Martinique, Peter M, MD as PCP - Cardiology (Cardiology) Lowella Bandy, MD (Inactive) (Urology) Ladene Artist, MD (Gastroenterology) Martinique, Peter M, MD as Consulting Physician (Cardiology) Keene Breath., MD as Consulting Physician (Ophthalmology) Madelin Rear, [facility] as Pharmacist (Pharmacist) Festus Aloe, MD as Consulting Physician (Urology)  CHIEF COMPLAINTS/PURPOSE OF CONSULTATION:  Retroperitoneal lymphadenopathy  ASSESSMENT & PLAN:  No problem-specific Assessment & Plan notes found for this encounter.  Orders Placed This Encounter  Procedures   Flow Cytometry, Peripheral Blood (Oncology)    Standing Status:   Future    Number of Occurrences:   1    Standing Expiration Date:   12/05/2021   This is a very pleasant 86 year old male patient whom I have seen in the hospital and admitted with small bowel obstruction.  He had CT imaging while he was inpatient which showed questionable wall thickening of the small bowel loop as well as some retroperitoneal adenopathy concerning for lymphoma.  He denies any symptoms but wife today complains of progressive fatigue over the past year.  CBC was normal except for mild anemia.  We have repeated the CT abdominal imaging which showed persistent retroperitoneal lymphadenopathy.  He has personal history of prostate cancer however PSA is normal at this time.  I have ordered a CBC, flow today on his peripheral blood.  If he does indeed have CLL, we might just monitor it unless he meets any criteria for treatment.  If flow does not reveal etiology for this persistent lymphadenopathy, we will proceed with IR guided biopsy.  He is agreeable to all the recommendations.  He understands that the working diagnosis is likely a low-grade lymphoma. Encourage physical therapy in the interim. He will return to clinic to follow-up with Dr. Lorenso Courier  unless he needs to be seen sooner. Thank you for consulting [place] in the care of this patient.  Please do not hesitate to contact us with any additional questions or concerns.  HISTORY OF PRESENTING ILLNESS:  Jose Ware 86 y.o. male is here because of retroperitoneal lymphadenopathy  This is a very pleasant 86 year old male patient whom I visited inpatient with past medical history significant for prostate cancer several years ago, hypertension who was admitted with chief complaint of abdominal pain and inability to have a bowel movement.  He also had nausea vomiting and diaphoresis at this time.  He had CT imaging while he was inpatient which showed questionable wall thickening of the small bowel loop in the central pelvis versus artifact from underdistention.  There was new retroperitoneal adenopathy most consistent with metastatic disease although this can also be seen with lymphoma.  Medical oncology was consulted while he was inpatient.  He absolutely denied any new symptoms.  He was very healthy prior to this hospital admission.  CT chest with no evidence of primary malignancy. CBC was perfectly normal except for mild anemia.  I recommended outpatient consultation for consideration of PET/CT and further biopsy as needed. When I discussed about his imaging with radiology, they have recommended repeat CT and consider biopsy if lymphadenopathy is persistent. He had repeat imaging yesterday which showed persistent periaortic adenopathy in the upper abdomen.  Findings concerning for lymphoma such as CLL.  No iliac or inguinal adenopathy.  He is here for follow-up with his wife.  Since his hospitalization, he continues to feel weak and is working with physical therapy.  He denies  any fevers or drenching night sweats, wife however reports a decline in energy levels and progressive fatigue in the past 1 year.  He has not lost weight before his hospitalization.  He is currently back to eating regularly.   No abdominal pain or other change in bowel habits. Rest of the pertinent 10 point ROS reviewed and negative.  MEDICAL HISTORY:  Past Medical History:  Diagnosis Date   Adenocarcinoma of prostate [facility])    XRT & Radiation seed implantation in 2010   Adenomatous colon polyp    CAD (coronary artery disease)    a. 11/04/15:  Acute inferolateral STEMI: S/p emergent DES of a very large LCx with extensive thrombus. Significant residual disease in the proximal LAD and distal RCA. S/p staged PCI of LAD and RCA 8/29.   Cataract    CVA (cerebral infarction)    a. 0/1779: embolic CVA after heart cath    CVA (cerebral infarction)    a. 05/9028: embolic CVA after heart cath    DIVERTICULITIS, HX OF 11/27/2006        DJD (degenerative joint disease)    wrist - R    Hernia    History of blood transfusion 1985   with colon surgery   Hypertension    Ischemic cardiomyopathy    a. EF 25-30% by LV gram on cath 11/04/15. Appeared out of proportion to infarct although infarct was large. EF improved by Echo and now 40-45%.   Myocardial infarction ([place])    NEPHROLITHIASIS, HX OF 11/27/2006         Peripheral neuropathy    Psoriasis    Tenosynovitis     SURGICAL HISTORY: Past Surgical History:  Procedure Laterality Date   [place]   minor disk surgery: instrumentation placed and removed in a second procedure   CARDIAC CATHETERIZATION N/A 11/04/2015   Procedure: Left Heart Cath and Coronary Angiography;  Surgeon: Peter M Martinique, MD;  Location: [place] [facility];  Service: Cardiovascular;  Laterality: N/A;   CARDIAC CATHETERIZATION N/A 11/04/2015   Procedure: Coronary Stent Intervention;  Surgeon: Peter M Martinique, MD;  Location: [place] [facility];  Service: Cardiovascular;  Laterality: N/A;   CARDIAC CATHETERIZATION N/A 11/06/2015   Procedure: Coronary Stent Intervention;  Surgeon: Leonie Man, MD;  Location: [place] [facility];  Service: Cardiovascular;  Laterality: N/A;    CATARACT EXTRACTION, BILATERAL  2013   CHOLECYSTECTOMY  1986   COLON SURGERY  1980's   pt. had ileus, had colon surgery, requiring colostomy & then reversal & then dehisence of that wound & return to OR for repair & cholecystectomy     COLONOSCOPY     COLOSTOMY  1985   After colectomy for diverticulitis, pt. remarks during this surgery they "gave me the paddles two times  because of bleeding", pt. states it was unrelated to any anesthesia complication   EYE SURGERY     /w IOL   KNEE ARTHROSCOPY Left 2003   [place], Right w/cartilage removed later   PARATHYROIDECTOMY N/A 05/12/2014   Procedure: PARATHYROIDECTOMY;  Surgeon: Armandina Gemma, MD;  Location: [place];  Service: General;  Laterality: N/A;   REVERSAL OF COLOSTOMY  1987   TONSILLECTOMY  1946   WRIST SURGERY Left    Remote complicated w/infection    SOCIAL HISTORY: Social History   Socioeconomic History   Marital status: Married    Spouse name: Not on file  Number of children: 3   Years of education: Not on file   Highest education level: Not on file  Occupational History   Not on file  Tobacco Use   Smoking status: Former    Packs/day: 2.00    Years: 37.00    Pack years: 74.00    Types: Cigarettes    Quit date: 03/11/1983    Years since quitting: 37.7   Smokeless tobacco: Never   Tobacco comments:    started smoking in the service; ICU 85' part of his stomach; appendix; coma   Vaping Use   Vaping Use: Never used  Substance and Sexual Activity   Alcohol use: Yes    Alcohol/week: 0.0 standard drinks    Comment: RARE   Drug use: No   Sexual activity: Not on file  Other Topics Concern   Not on file  Social History Narrative   [place], [place].   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      Hobbies: fishing, busy volunteering            Social Determinants of Radio broadcast assistant Strain: Low Risk    Difficulty of Paying Living Expenses: Not hard at all  Food Insecurity: No Food Insecurity   Worried About Charity fundraiser in the Last Year: Never true   Arboriculturist in the Last Year: Never true  Transportation Needs: No Transportation Needs   Lack of Transportation (Medical): No   Lack of Transportation (Non-Medical): No  Physical Activity: Sufficiently Active   Days of Exercise per Week: 4 days   Minutes of Exercise per Session: 60 min  Stress: No Stress Concern Present   Feeling of Stress : Not at all  Social Connections: Moderately Integrated   Frequency of Communication with Friends and Family: Once a week   Frequency of Social Gatherings with Friends and Family: Once a week   Attends Religious Services: More than 4 times per year   Active Member of [employer] or Organizations: Yes   Attends Music therapist: 1 to 4 times per year   Marital Status: Married  Human resources officer Violence: Not At Risk   Fear of Current or Ex-Partner: No   Emotionally Abused: No   Physically Abused: No   Sexually Abused: No    FAMILY HISTORY: Family History  Problem Relation Age of Onset   Coronary artery disease Mother    Alcohol abuse Father    Cirrhosis Father    Heart failure Sister    Breast cancer Sister        W/involvement of right arm leading to amputation   Coronary artery disease Brother    Heart attack Brother    Non-Hodgkin's lymphoma Brother    Clotting disorder Brother    Non-Hodgkin's lymphoma Brother    Anemia Sister        related to treatment to lymphoma   Non-Hodgkin's lymphoma Sister    Prostate cancer Neg Hx    Colon cancer Neg Hx    Diabetes Neg Hx    Glaucoma Neg Hx    Rectal cancer Neg Hx    Esophageal cancer Neg Hx  ALLERGIES:  is allergic to  bee venom, betadine [povidone iodine], and doxycycline.  MEDICATIONS:  Current Outpatient Medications  Medication Sig Dispense Refill   acetaminophen (TYLENOL) 650 MG CR tablet Take 650 mg by mouth 2 (two) times daily as needed for pain (headache).     amLODipine (NORVASC) 5 MG tablet Take 1 tablet by mouth once daily (Patient taking differently: Take 5 mg by mouth daily with supper.) 90 tablet 0   aspirin EC 81 MG tablet Take 1 tablet (81 mg total) by mouth daily. 90 tablet 1   atorvastatin (LIPITOR) 20 MG tablet Take 1 tablet (20 mg total) by mouth daily. (Patient taking differently: Take 20 mg by mouth daily with supper.) 90 tablet 3   beta carotene w/minerals (OCUVITE) tablet Take 1 tablet by mouth every morning.     carvedilol (COREG) 3.125 MG tablet Take 1 tablet by mouth twice daily 180 tablet 3   Cyanocobalamin (VITAMIN B-12) 5000 MCG TBDP Take 5,000 mcg by mouth every morning.     losartan (COZAAR) 100 MG tablet Take 1 tablet by mouth once daily (Patient taking differently: Take 100 mg by mouth every morning.) 90 tablet 3   nitroGLYCERIN (NITROSTAT) 0.4 MG SL tablet Place 1 tablet (0.4 mg total) under the tongue every 5 (five) minutes x 3 doses as needed for chest pain. 25 tablet 2   spironolactone (ALDACTONE) 25 MG tablet Take 1 tablet by mouth once daily (Patient taking differently: Take 25 mg by mouth every morning.) 90 tablet 1   No current facility-administered medications for this visit.     PHYSICAL EXAMINATION:  ECOG PERFORMANCE STATUS: 0 - Asymptomatic  Vitals:   12/05/20 1358  BP: (!) 118/44  Pulse: 67  Resp: 19  Temp: 97.6 F (36.4 C)  SpO2: 96%   Filed Weights   12/05/20 1358  Weight: 198 lb 11.2 oz (90.1 kg)    GENERAL:alert, no distress and comfortable SKIN: skin color, texture, turgor are normal, no rashes or significant lesions EYES: normal, conjunctiva are pink and non-injected, sclera clear OROPHARYNX:no exudate, no erythema and lips, buccal mucosa,  and tongue normal  NECK: supple, thyroid normal size, non-tender, without nodularity LYMPH:  no palpable lymphadenopathy in the cervical, axillary or inguinal LUNGS: clear to auscultation and percussion with normal breathing effort HEART: regular rate & rhythm and no murmurs and no lower extremity edema ABDOMEN:abdomen soft, non-tender and normal bowel sounds Musculoskeletal:no cyanosis of digits and no clubbing  PSYCH: alert & oriented x 3 with fluent speech NEURO: no focal motor/sensory deficits  LABORATORY DATA:  I have reviewed the data as listed Lab Results  Component Value Date   WBC 7.3 12/05/2020   HGB 12.0 (L) 12/05/2020   HCT 36.3 (L) 12/05/2020   MCV 97.8 12/05/2020   PLT 212 12/05/2020     Chemistry      Component Value Date/Time   NA 141 12/05/2020 1342   NA 140 05/06/2018 1103   K 4.1 12/05/2020 1342   CL 105 12/05/2020 1342   CO2 26 12/05/2020 1342   BUN 27 (H) 12/05/2020 1342   BUN 27 05/06/2018 1103   CREATININE 1.37 (H) 12/05/2020 1342   CREATININE 1.10 12/23/2019 1204      Component Value Date/Time   CALCIUM 9.1 12/05/2020 1342   ALKPHOS 72 12/05/2020 1342   AST 10 (L) 12/05/2020 1342   ALT 11 12/05/2020 1342   BILITOT 0.9 12/05/2020 1342   BILITOT 1.2 05/06/2018 1103     PSA  was normal.  LDH was normal.  RADIOGRAPHIC STUDIES: I have personally reviewed the radiological images as listed and agreed with the findings in the report. CT CHEST W CONTRAST  Result Date: 11/12/2020 CLINICAL DATA:  Retroperitoneal adenopathy suspicious for metastatic disease. Cancer of unknown primary. Staging. EXAM: CT CHEST WITH CONTRAST TECHNIQUE: Multidetector CT imaging of the chest was performed during intravenous contrast administration. CONTRAST:  11mL OMNIPAQUE IOHEXOL 350 MG/ML SOLN COMPARISON:  One-view chest x-ray 11/04/2015 FINDINGS: Cardiovascular: Coronary artery calcifications are present. Heart size is normal. Atherosclerotic calcifications are present in the  aorta and branch vessel origins without aneurysm or stenosis. Pulmonary artery there is a pulmonary arteries are within normal limits. Mediastinum/Nodes: No significant mediastinal, hilar, or axillary adenopathy is present. Heterogeneous thyroid present without dominant nodule. Not clinically significant; no follow-up imaging recommendedesophagus is unremarkable. Lungs/Pleura: Small peripheral high density nodules are present in the right upper thyroid best seen on image 53 of image 5. No other focal nodules are present. Small effusions are present, left greater than right. Associated dependent atelectasis is noted. Airways are patent. Upper Abdomen: Stomach is distended as before. Renal cysts are partially visualized. Cholecystectomy noted. Musculoskeletal: Vertebral body heights and alignment are normal. IMPRESSION: 1. No evidence for primary malignancy in the chest. 2. Coronary artery disease. 3. Small peripheral high density nodules in the right upper lobe are likely post infectious. No follow-up recommended. 4. Aortic Atherosclerosis (ICD10-I70.0). Electronically Signed   By: San Morelle M.D.   On: 11/12/2020 14:31   CT Abdomen Pelvis W Contrast  Result Date: 12/04/2020 CLINICAL DATA:  Retroperitoneal lymphadenopathy. History of prostate cancer EXAM: CT ABDOMEN AND PELVIS WITH CONTRAST TECHNIQUE: Multidetector CT imaging of the abdomen and pelvis was performed using the standard protocol following bolus administration of intravenous contrast. CONTRAST:  23mL OMNIPAQUE IOHEXOL 350 MG/ML SOLN COMPARISON:  CT 11/11/2020 FINDINGS: Lower chest: Lung bases are clear. Coronary artery calcification and aortic atherosclerotic calcification. Hepatobiliary: No focal hepatic lesion. Postcholecystectomy. No biliary dilatation. Pancreas: Pancreas is normal. No ductal dilatation. No pancreatic inflammation. Spleen: Normal spleen Adrenals/urinary tract: Adrenal glands normal. Bilateral simple fluid attenuation  renal cysts. Ureters and bladder normal. Stomach/Bowel: Stomach, duodenum small-bowel normal. Cecum is normal. Anastomosis in the mid sigmoid colon obstruction. Rectum normal Vascular/Lymphatic: Abdominal aorta normal caliber with intimal calcification. Again demonstrated enlarged periaortic lymph nodes. Lymph node LEFT of the aorta at the level of the kidneys measures 2.3 cm compared to 2.2 cm. Node position between the IVC and aorta at the same level measures 1.8 cm compared to 1.9 cm. Adenopathy drapes over the aorta at this level. No periportal adenopathy. Potential enlarged retrocrural node measuring 14 mm image 19/2. Lymph nodes new from CT 2016. No iliac or inguinal adenopathy. Reproductive: Multiple brachytherapy seeds in the prostate gland. Other: No free fluid. Musculoskeletal: Lucencies within lumbar spine unchanged from CT 2016. Endplate degeneration noted. IMPRESSION: 1. Persistent periaortic adenopathy in the upper abdomen. Normal PSA. Findings concerning for lymphoma such as chronic lymphocytic leukemia. 2. No iliac adenopathy or inguinal adenopathy. 3. Coronary artery calcification and Aortic Atherosclerosis (ICD10-I70.0). Electronically Signed   By: Suzy Bouchard M.D.   On: 12/04/2020 15:57   CT ABDOMEN PELVIS W CONTRAST  Result Date: 11/11/2020 CLINICAL DATA:  RIGHT-side abdominal pain since yesterday, history prostate cancer EXAM: CT ABDOMEN AND PELVIS WITH CONTRAST TECHNIQUE: Multidetector CT imaging of the abdomen and pelvis was performed using the standard protocol following bolus administration of intravenous contrast. CONTRAST:  77mL OMNIPAQUE IOHEXOL 350 MG/ML SOLN  IV. No oral contrast. COMPARISON:  01/05/2015 FINDINGS: Lower chest: Dependent bibasilar atelectasis Hepatobiliary: Gallbladder surgically absent. Intrahepatic biliary dilatation slightly increased from previous exam. CBD normal caliber without calcification. No focal hepatic mass. Pancreas: Normal appearance Spleen: Normal  appearance Adrenals/Urinary Tract: Adrenal glands normal appearance. Multiple renal cysts bilaterally. No hydronephrosis or hydroureter. Bladder unremarkable. Stomach/Bowel: Appendix surgically absent by history. Sigmoid anastomosis. No colonic abnormalities. Distended stomach. Scattered dilated proximal mid small bowel loops with intervening area of normal caliber. Decompressed distal small bowel loops. Questionable wall thickening of a small bowel loop in the central pelvis versus artifact from underdistention, cannot exclude enteritis. A discrete point of bowel obstruction/transition zone is not identified. Vascular/Lymphatic: Extensive atherosclerotic calcifications aorta, iliac arteries, visceral arteries, coronary arteries. Aorta normal caliber. LEFT para-aortic adenopathy, new, including 27 mm short axis node image 40, 14 mm short axis node image 44, and 15 mm short axis node image 45. Enlarged aortocaval nodes 19 mm image 40 and 21 mm image 33. Additional adenopathy anterior to the aorta and aortocaval region up to 15 mm. Multiple normal to upper normal size mesenteric lymph nodes. Enlarged retrocrural node 15 mm image 19 previously 16 mm. Reproductive: Brachytherapy seed implants within prostate gland/bed. Seminal vesicles unremarkable. Other: Small LEFT inguinal hernia containing fat. No free air or free fluid. Musculoskeletal: Osseous demineralization. Scattered enthesopathy. Mild sclerosis at RIGHT SI joint. Lytic lesion L2 vertebral body unchanged. Arachnoid cyst versus Tarlov cyst S2 sacral canal unchanged. Degenerative disc disease changes lumbar spine with chronic superior endplate compression deformity L1 unchanged. IMPRESSION: Questionable wall thickening of a small bowel loop in the central pelvis versus artifact from underdistention, cannot exclude enteritis. Scattered small bowel loops appear dilated though a segment of mid small bowel and the distal small bowel appear normal in caliber, with no  discrete transition zone identified. New retroperitoneal adenopathy most consistent with metastatic disease though this can also be seen with lymphoma. Increase in intrahepatic biliary dilatation post cholecystectomy, recommend correlation with LFTs. Small LEFT inguinal hernia containing fat. Aortic Atherosclerosis (ICD10-I70.0). Electronically Signed   By: Lavonia Dana M.D.   On: 11/11/2020 12:34   DG Abd Portable 1V  Result Date: 11/12/2020 CLINICAL DATA:  Abdominal pain with nausea and vomiting EXAM: PORTABLE ABDOMEN - 1 VIEW COMPARISON:  CT from yesterday FINDINGS: Continued gas dilated small bowel loops measuring up to 4 cm in diameter. Moderate gaseous distention of the stomach. Similar degree of colonic gas and stool. Postoperative abdomen with pelvic bowel sutures. Prostate brachytherapy seeds. IMPRESSION: Partial small bowel obstruction. No significant change from yesterday. Electronically Signed   By: Monte Fantasia M.D.   On: 11/12/2020 08:35   UE Venous Duplex ([facility] and [facility] ONLY)  Result Date: 11/11/2020 UPPER VENOUS STUDY  Patient Name:  [facility]  Date of Exam:   11/11/2020 Medical Rec #: [id number]           Accession #:    [id number] Date of Birth: 07-15-1934           Patient Gender: M Patient Age:   86 years Exam Location:  [facility] Procedure:      VAS [place] UPPER EXTREMITY VENOUS DUPLEX Referring Phys: Wynona Dove --------------------------------------------------------------------------------  Indications: Edema, and Erythema Comparison Study: No prior study Performing Technologist: Maudry Mayhew MHA, RDMS, RVT, RDCS  Examination Guidelines: A complete evaluation includes B-mode imaging, spectral Doppler, color Doppler, and power Doppler as needed of all accessible portions of each vessel. Bilateral testing is considered an integral part  of a complete examination. Limited examinations for reoccurring indications may be performed as noted.  Right Findings:  +----------+------------+---------+-----------+----------+-------+ RIGHT     CompressiblePhasicitySpontaneousPropertiesSummary +----------+------------+---------+-----------+----------+-------+ Subclavian               Yes       Yes                      +----------+------------+---------+-----------+----------+-------+  Left Findings: +----------+------------+---------+-----------+----------+-------+ LEFT      CompressiblePhasicitySpontaneousPropertiesSummary +----------+------------+---------+-----------+----------+-------+ IJV           Full       Yes       Yes                      +----------+------------+---------+-----------+----------+-------+ Subclavian    Full       Yes       Yes                      +----------+------------+---------+-----------+----------+-------+ Axillary      Full       Yes       Yes                      +----------+------------+---------+-----------+----------+-------+ Brachial      Full       Yes       Yes                      +----------+------------+---------+-----------+----------+-------+ Radial        Full                                          +----------+------------+---------+-----------+----------+-------+ Ulnar         Full                                          +----------+------------+---------+-----------+----------+-------+ Cephalic      Full                                          +----------+------------+---------+-----------+----------+-------+ Basilic       Full                                          +----------+------------+---------+-----------+----------+-------+  Summary:  Right: No evidence of thrombosis in the subclavian.  Left: No evidence of deep vein thrombosis in the upper extremity. No evidence of superficial vein thrombosis in the upper extremity.  *See table(s) above for measurements and observations.  Diagnosing physician: Deitra Mayo MD Electronically signed by  Deitra Mayo MD on 11/11/2020 at 1:54:54 PM.    Final     All questions were answered. The patient knows to call the clinic with any problems, questions or concerns. I spent breast minutes in the care of this patient including H and P, review of records, counseling and coordination of care.     Benay Pike, MD 12/05/2020 2:39 PM

## 2020-12-06 ENCOUNTER — Telehealth: Payer: Self-pay

## 2020-12-06 LAB — SURGICAL PATHOLOGY

## 2020-12-06 NOTE — Telephone Encounter (Signed)
Noted  

## 2020-12-06 NOTE — Telephone Encounter (Signed)
Hilliard Clark is a physical therapist from [facility] called wanting to let Dr Yong Channel know that Jose Ware missed a visit this week. Jose Ware had a doctor appt yesterday and did not want to do the therapy session today.  Hilliard Clark should be seeing Jose Ware tomorrow for physical therapy. Hilliard Clark can be reached at [phone] with any questions.

## 2020-12-07 DIAGNOSIS — I2510 Atherosclerotic heart disease of native coronary artery without angina pectoris: Secondary | ICD-10-CM | POA: Diagnosis not present

## 2020-12-07 DIAGNOSIS — E876 Hypokalemia: Secondary | ICD-10-CM | POA: Diagnosis not present

## 2020-12-07 DIAGNOSIS — I700 Atherosclerosis of aorta: Secondary | ICD-10-CM | POA: Diagnosis not present

## 2020-12-07 DIAGNOSIS — I10 Essential (primary) hypertension: Secondary | ICD-10-CM | POA: Diagnosis not present

## 2020-12-07 DIAGNOSIS — K5669 Other partial intestinal obstruction: Secondary | ICD-10-CM | POA: Diagnosis not present

## 2020-12-07 LAB — FLOW CYTOMETRY

## 2020-12-11 ENCOUNTER — Other Ambulatory Visit: Payer: Self-pay | Admitting: Hematology and Oncology

## 2020-12-11 DIAGNOSIS — K5669 Other partial intestinal obstruction: Secondary | ICD-10-CM | POA: Diagnosis not present

## 2020-12-11 DIAGNOSIS — I700 Atherosclerosis of aorta: Secondary | ICD-10-CM | POA: Diagnosis not present

## 2020-12-11 DIAGNOSIS — R590 Localized enlarged lymph nodes: Secondary | ICD-10-CM

## 2020-12-11 DIAGNOSIS — E876 Hypokalemia: Secondary | ICD-10-CM | POA: Diagnosis not present

## 2020-12-11 DIAGNOSIS — I2510 Atherosclerotic heart disease of native coronary artery without angina pectoris: Secondary | ICD-10-CM | POA: Diagnosis not present

## 2020-12-11 DIAGNOSIS — I10 Essential (primary) hypertension: Secondary | ICD-10-CM | POA: Diagnosis not present

## 2020-12-12 ENCOUNTER — Encounter (HOSPITAL_COMMUNITY): Payer: Self-pay | Admitting: Radiology

## 2020-12-12 NOTE — Progress Notes (Signed)
Patient Name  Graciano, Batson Legal Sex  Male DOB  [birth year] SSN  [id number] Address  [place] [address] Phone  [phone] (Home) *Preferred*    RE: CT Biopsy Received: Today Arne Cleveland, MD  [place], [place]   CT core L paraaortic LAN  [id number] Im 55 Se2   DDH        Previous Messages   ----- Message -----  From: Garth Bigness D  Sent: 12/11/2020   6:45 PM EDT  To: Ir Procedure Requests  Subject: CT Biopsy                                       Procedure:  CT Biopsy   Reason:  Retroperitoneal lymphadenopathy, retroperitoneal lymph node enlargement, will need CT guided biopsy, prefer a core if possible.   History:  CT in computer   Provider:  Benay Pike   Provider contact:  [phone]

## 2020-12-14 ENCOUNTER — Other Ambulatory Visit: Payer: Self-pay | Admitting: Radiology

## 2020-12-15 ENCOUNTER — Other Ambulatory Visit: Payer: Self-pay | Admitting: Family Medicine

## 2020-12-17 ENCOUNTER — Encounter (HOSPITAL_COMMUNITY): Payer: Self-pay

## 2020-12-17 ENCOUNTER — Ambulatory Visit (HOSPITAL_COMMUNITY)
Admission: RE | Admit: 2020-12-17 | Discharge: 2020-12-17 | Disposition: A | Discharge status: Home / Self Care | Payer: Medicare Other | Source: Ambulatory Visit | Attending: Hematology and Oncology | Admitting: Hematology and Oncology

## 2020-12-17 ENCOUNTER — Other Ambulatory Visit: Payer: Self-pay

## 2020-12-17 DIAGNOSIS — I10 Essential (primary) hypertension: Secondary | ICD-10-CM | POA: Insufficient documentation

## 2020-12-17 DIAGNOSIS — R590 Localized enlarged lymph nodes: Secondary | ICD-10-CM

## 2020-12-17 DIAGNOSIS — Z87891 Personal history of nicotine dependence: Secondary | ICD-10-CM | POA: Insufficient documentation

## 2020-12-17 DIAGNOSIS — I2510 Atherosclerotic heart disease of native coronary artery without angina pectoris: Secondary | ICD-10-CM | POA: Diagnosis not present

## 2020-12-17 DIAGNOSIS — Z955 Presence of coronary angioplasty implant and graft: Secondary | ICD-10-CM | POA: Diagnosis not present

## 2020-12-17 DIAGNOSIS — I255 Ischemic cardiomyopathy: Secondary | ICD-10-CM | POA: Diagnosis not present

## 2020-12-17 DIAGNOSIS — Z86010 Personal history of colonic polyps: Secondary | ICD-10-CM | POA: Insufficient documentation

## 2020-12-17 DIAGNOSIS — C8293 Follicular lymphoma, unspecified, intra-abdominal lymph nodes: Secondary | ICD-10-CM | POA: Diagnosis not present

## 2020-12-17 DIAGNOSIS — Z7982 Long term (current) use of aspirin: Secondary | ICD-10-CM | POA: Diagnosis not present

## 2020-12-17 DIAGNOSIS — R591 Generalized enlarged lymph nodes: Secondary | ICD-10-CM | POA: Diagnosis not present

## 2020-12-17 DIAGNOSIS — D72820 Lymphocytosis (symptomatic): Secondary | ICD-10-CM | POA: Diagnosis not present

## 2020-12-17 DIAGNOSIS — I252 Old myocardial infarction: Secondary | ICD-10-CM | POA: Insufficient documentation

## 2020-12-17 DIAGNOSIS — Z8546 Personal history of malignant neoplasm of prostate: Secondary | ICD-10-CM | POA: Insufficient documentation

## 2020-12-17 DIAGNOSIS — Z79899 Other long term (current) drug therapy: Secondary | ICD-10-CM | POA: Insufficient documentation

## 2020-12-17 LAB — CBC WITH DIFFERENTIAL/PLATELET
Abs Immature Granulocytes: 0.03 10*3/uL (ref 0.00–0.07)
Basophils Absolute: 0 10*3/uL (ref 0.0–0.1)
Basophils Relative: 1 %
Eosinophils Absolute: 0.2 10*3/uL (ref 0.0–0.5)
Eosinophils Relative: 2 %
HCT: 37.3 % — ABNORMAL LOW (ref 39.0–52.0)
Hemoglobin: 12.3 g/dL — ABNORMAL LOW (ref 13.0–17.0)
Immature Granulocytes: 0 %
Lymphocytes Relative: 13 %
Lymphs Abs: 1.1 10*3/uL (ref 0.7–4.0)
MCH: 32.4 pg (ref 26.0–34.0)
MCHC: 33 g/dL (ref 30.0–36.0)
MCV: 98.2 fL (ref 80.0–100.0)
Monocytes Absolute: 0.9 10*3/uL (ref 0.1–1.0)
Monocytes Relative: 10 %
Neutro Abs: 6.4 10*3/uL (ref 1.7–7.7)
Neutrophils Relative %: 74 %
Platelets: 265 10*3/uL (ref 150–400)
RBC: 3.8 MIL/uL — ABNORMAL LOW (ref 4.22–5.81)
RDW: 13.9 % (ref 11.5–15.5)
WBC: 8.6 10*3/uL (ref 4.0–10.5)
nRBC: 0 % (ref 0.0–0.2)

## 2020-12-17 LAB — BASIC METABOLIC PANEL
Anion gap: 6 (ref 5–15)
BUN: 28 mg/dL — ABNORMAL HIGH (ref 8–23)
CO2: 28 mmol/L (ref 22–32)
Calcium: 9.3 mg/dL (ref 8.9–10.3)
Chloride: 108 mmol/L (ref 98–111)
Creatinine, Ser: 1.06 mg/dL (ref 0.61–1.24)
GFR, Estimated: >60 mL/min (ref >60)
Glucose, Bld: 84 mg/dL (ref 70–99)
Potassium: 4.1 mmol/L (ref 3.5–5.1)
Sodium: 142 mmol/L (ref 135–145)

## 2020-12-17 LAB — PROTIME-INR
INR: 1.1 (ref 0.8–1.2)
Prothrombin Time: 14.6 seconds (ref 11.4–15.2)

## 2020-12-17 MED ORDER — FENTANYL CITRATE (PF) 100 MCG/2ML IJ SOLN
INTRAMUSCULAR | Status: DC | PRN
  Administered 2020-12-17 (×2): 25 ug via INTRAVENOUS
  Administered 2020-12-17: 50 ug via INTRAVENOUS

## 2020-12-17 MED ORDER — SODIUM CHLORIDE 0.9 % IV SOLN: INTRAVENOUS | Status: DC

## 2020-12-17 MED ORDER — MIDAZOLAM HCL 2 MG/2ML IJ SOLN
INTRAMUSCULAR | Status: DC | PRN
  Administered 2020-12-17: .5 mg via INTRAVENOUS
  Administered 2020-12-17: 1 mg via INTRAVENOUS

## 2020-12-17 MED ORDER — FENTANYL CITRATE (PF) 100 MCG/2ML IJ SOLN
INTRAMUSCULAR | Status: AC
  Filled 2020-12-17: qty 2

## 2020-12-17 MED ORDER — MIDAZOLAM HCL 2 MG/2ML IJ SOLN
INTRAMUSCULAR | Status: AC
  Filled 2020-12-17: qty 4

## 2020-12-17 NOTE — Discharge Instructions (Addendum)
Please call Interventional Radiology clinic [phone] with any questions or concerns.   You may remove your dressing and shower tomorrow.    Needle Biopsy, Care After These instructions tell you how to care for yourself after your procedure. Your doctor may also give you more specific instructions. Call your doctor if youhave any problems or questions. What can I expect after the procedure? After the procedure, it is common to have: Soreness. Bruising. Mild pain. Follow these instructions at home:  Return to your normal activities as told by your doctor. Ask your doctor what activities are safe for you. Take over-the-counter and prescription medicines only as told by your doctor. Wash your hands with soap and water before you change your bandage (dressing). If you cannot use soap and water, use hand sanitizer. Follow instructions from your doctor about: How to take care of your puncture site. When and how to change your bandage. When to remove your bandage. Check your puncture site every day for signs of infection. Watch for: Redness, swelling, or pain. Fluid or blood. Pus or a bad smell. Warmth. Do not take baths, swim, or use a hot tub until your doctor approves. Ask your doctor if you may take showers. You may only be allowed to take sponge baths. Keep all follow-up visits as told by your doctor. This is important. Contact a doctor if you have: A fever. Redness, swelling, or pain at the puncture site, and it lasts longer than a few days. Fluid, blood, or pus coming from the puncture site. Warmth coming from the puncture site. Get help right away if: You have a lot of bleeding from the puncture site. Summary After the procedure, it is common to have soreness, bruising, or mild pain at the puncture site. Check your puncture site every day for signs of infection, such as redness, swelling, or pain. Get help right away if you have severe bleeding from your puncture  site. This information is not intended to replace advice given to you by your health care provider. Make sure you discuss any questions you have with your healthcare provider. Document Revised: 08/25/2019 Document Reviewed: 08/25/2019 Elsevier Patient Education  2022 Elsevier Inc.   Moderate Conscious Sedation, Adult, Care After This sheet gives you information about how to care for yourself after your procedure. Your health care provider may also give you more specific instructions. If you have problems or questions, contact your health care provider. What can I expect after the procedure? After the procedure, it is common to have: Sleepiness for several hours. Impaired judgment for several hours. Difficulty with balance. Vomiting if you eat too soon. Follow these instructions at home: For the time period you were told by your health care provider: Rest. Do not participate in activities where you could fall or become injured. Do not drive or use machinery. Do not drink alcohol. Do not take sleeping pills or medicines that cause drowsiness. Do not make important decisions or sign legal documents. Do not take care of children on your own.      Eating and drinking Follow the diet recommended by your health care provider. Drink enough fluid to keep your urine pale yellow. If you vomit: Drink water, juice, or soup when you can drink without vomiting. Make sure you have little or no nausea before eating solid foods.   General instructions Take over-the-counter and prescription medicines only as told by your health care provider. Have a responsible adult stay with you for the time you are told.   It is important to have someone help care for you until you are awake and alert. Do not smoke. Keep all follow-up visits as told by your health care provider. This is important. Contact a health care provider if: You are still sleepy or having trouble with balance after 24 hours. You feel  light-headed. You keep feeling nauseous or you keep vomiting. You develop a rash. You have a fever. You have redness or swelling around the IV site. Get help right away if: You have trouble breathing. You have new-onset confusion at home. Summary After the procedure, it is common to feel sleepy, have impaired judgment, or feel nauseous if you eat too soon. Rest after you get home. Know the things you should not do after the procedure. Follow the diet recommended by your health care provider and drink enough fluid to keep your urine pale yellow. Get help right away if you have trouble breathing or new-onset confusion at home. This information is not intended to replace advice given to you by your health care provider. Make sure you discuss any questions you have with your health care provider. Document Revised: 06/24/2019 Document Reviewed: 01/20/2019 Elsevier Patient Education  2021 Elsevier Inc.     

## 2020-12-17 NOTE — Procedures (Signed)
Interventional Radiology Procedure:   Indications: Retroperitoneal lymphadenopathy  Procedure: CT guided core biopsy of left RP lymphadenopathy  Findings:  Core specimens obtained from left RP lymphadenopathy.  Specimens placed in saline.   Complications: No immediate complications noted.     EBL: Minimal  Plan: Bedrest 2 hours, then discharge to home.    Jamon Hayhurst R. Anselm Pancoast, MD  Pager: [phone]

## 2020-12-17 NOTE — Consult Note (Signed)
Chief Complaint: Patient was seen in consultation today for image guided biopsy of left paraaortic lymph node  Referring Physician(s): Iruku,Praveena  Supervising Physician: Markus Daft  Patient Status: [facility] - Out-pt  History of Present Illness: Jose Ware is an 86 y.o. male with prior hx prostate cancer 2010(RT/seed implant), CAD/MI/stenting, prior CVA 2017, diverticular dz, HTN, ICM, renal stones who presents now with RP adenopathy of uncertain etiology. Latest PSA is nl. He is scheduled today for image guided biopsy of left para aortic LN for further evaluation.   Past Medical History:  Diagnosis Date   Adenocarcinoma of prostate [facility])    XRT & Radiation seed implantation in 2010   Adenomatous colon polyp    CAD (coronary artery disease)    a. 11/04/15:  Acute inferolateral STEMI: S/p emergent DES of a very large LCx with extensive thrombus. Significant residual disease in the proximal LAD and distal RCA. S/p staged PCI of LAD and RCA 8/29.   Cataract    CVA (cerebral infarction)    a. 03/4479: embolic CVA after heart cath    CVA (cerebral infarction)    a. 10/5629: embolic CVA after heart cath    DIVERTICULITIS, HX OF 11/27/2006        DJD (degenerative joint disease)    wrist - R    Hernia    History of blood transfusion 1985   with colon surgery   Hypertension    Ischemic cardiomyopathy    a. EF 25-30% by LV gram on cath 11/04/15. Appeared out of proportion to infarct although infarct was large. EF improved by Echo and now 40-45%.   Myocardial infarction ([place])    NEPHROLITHIASIS, HX OF 11/27/2006         Peripheral neuropathy    Psoriasis    Tenosynovitis     Past Surgical History:  Procedure Laterality Date   [place]   minor disk surgery: instrumentation placed and removed in a second procedure   CARDIAC CATHETERIZATION N/A 11/04/2015   Procedure: Left Heart Cath and Coronary Angiography;  Surgeon: Peter M Martinique, MD;   Location: [place] CV LAB;  Service: Cardiovascular;  Laterality: N/A;   CARDIAC CATHETERIZATION N/A 11/04/2015   Procedure: Coronary Stent Intervention;  Surgeon: Peter M Martinique, MD;  Location: [place] [facility];  Service: Cardiovascular;  Laterality: N/A;   CARDIAC CATHETERIZATION N/A 11/06/2015   Procedure: Coronary Stent Intervention;  Surgeon: Leonie Man, MD;  Location: [place] [facility];  Service: Cardiovascular;  Laterality: N/A;   CATARACT EXTRACTION, BILATERAL  2013   CHOLECYSTECTOMY  1986   COLON SURGERY  1980's   pt. had ileus, had colon surgery, requiring colostomy & then reversal & then dehisence of that wound & return to OR for repair & cholecystectomy     COLONOSCOPY     COLOSTOMY  1985   After colectomy for diverticulitis, pt. remarks during this surgery they "gave me the paddles two times  because of bleeding", pt. states it was unrelated to any anesthesia complication   EYE SURGERY     /w IOL   KNEE ARTHROSCOPY Left 2003   [place], Right w/cartilage removed later   PARATHYROIDECTOMY N/A 05/12/2014   Procedure: PARATHYROIDECTOMY;  Surgeon: Armandina Gemma, MD;  Location: [place];  Service: General;  Laterality: N/A;   [place] Left  Remote complicated w/infection    Allergies: Bee venom, Betadine [povidone iodine], and Doxycycline  Medications: Prior to Admission medications   Medication Sig Start Date End Date Taking? Authorizing Provider  acetaminophen (TYLENOL) 650 MG CR tablet Take 650 mg by mouth 2 (two) times daily as needed for pain (headache).   Yes [provider]  amLODipine (NORVASC) 5 MG tablet Take 1 tablet by mouth once daily Patient taking differently: Take 5 mg by mouth daily with supper. 09/11/20  Yes [name], MD  atorvastatin (LIPITOR) 20 MG tablet Take 1 tablet (20 mg total) by mouth daily. Patient taking differently: Take 20 mg by mouth daily with  supper. 12/26/19  Yes [name], MD  beta carotene w/minerals (OCUVITE) tablet Take 1 tablet by mouth every morning.   Yes [provider]  carvedilol (COREG) 3.125 MG tablet Take 1 tablet by mouth twice daily 11/26/20  Yes Martinique, Peter M, MD  Cyanocobalamin (VITAMIN B-12) 5000 MCG TBDP Take 5,000 mcg by mouth every morning.   Yes [provider]  losartan (COZAAR) 100 MG tablet Take 1 tablet by mouth once daily Patient taking differently: Take 100 mg by mouth every morning. 07/23/20  Yes Martinique, Peter M, MD  spironolactone (ALDACTONE) 25 MG tablet Take 1 tablet by mouth once daily Patient taking differently: Take 25 mg by mouth every morning. 07/09/20  Yes Martinique, Peter M, MD  aspirin EC 81 MG tablet Take 1 tablet (81 mg total) by mouth daily. 11/16/15   Martinique, Peter M, MD  nitroGLYCERIN (NITROSTAT) 0.4 MG SL tablet Place 1 tablet (0.4 mg total) under the tongue every 5 (five) minutes x 3 doses as needed for chest pain. 06/20/19   [name], MD     Family History  Problem Relation Age of Onset   Coronary artery disease Mother    Alcohol abuse Father    Cirrhosis Father    Heart failure Sister    Breast cancer Sister        W/involvement of right arm leading to amputation   Coronary artery disease Brother    Heart attack Brother    Non-Hodgkin's lymphoma Brother    Clotting disorder Brother    Non-Hodgkin's lymphoma Brother    Anemia Sister        related to treatment to lymphoma   Non-Hodgkin's lymphoma Sister    Prostate cancer Neg Hx    Colon cancer Neg Hx    Diabetes Neg Hx    Glaucoma Neg Hx    Rectal cancer Neg Hx    Esophageal cancer Neg Hx     Social History   Socioeconomic History   Marital status: Married    Spouse name: Not on file   Number of children: 3   Years of education: Not on file   Highest education level: Not on file  Occupational History   Not on file  Tobacco Use   Smoking status: Former    Packs/day: 2.00    Years:  37.00    Pack years: 74.00    Types: Cigarettes    Quit date: 03/11/1983    Years since quitting: 37.7   Smokeless tobacco: Never   Tobacco comments:    started smoking in the service; ICU 85' part of his stomach; appendix; coma   Vaping Use   Vaping Use: Never used  Substance and Sexual Activity   Alcohol use: Yes    Alcohol/week: 0.0 standard drinks    Comment: RARE  Drug use: No   Sexual activity: Not on file  Other Topics Concern   Not on file  Social History Narrative   [place].   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      Hobbies: fishing, busy volunteering            Social Determinants of Radio broadcast assistant Strain: Low Risk    Difficulty of Paying Living Expenses: Not hard at all  Food Insecurity: No Food Insecurity   Worried About Charity fundraiser in the Last Year: Never true   Arboriculturist in the Last Year: Never true  Transportation Needs: No Transportation Needs   Lack of Transportation (Medical): No   Lack of Transportation (Non-Medical): No  Physical Activity: Sufficiently Active   Days of Exercise per Week: 4 days   Minutes of Exercise per Session: 60 min  Stress: No Stress Concern Present   Feeling of Stress : Not at all  Social Connections: Moderately Integrated   Frequency of Communication with Friends and Family: Once a week   Frequency of Social Gatherings with Friends and Family: Once a week   Attends Religious Services: More than 4 times per year   Active Member of [employer] or Organizations: Yes   Attends Archivist Meetings: 1 to 4 times per year   Marital Status: Married     Review of Systems denies fever,HA,CP,dyspnea, cough, abd/back  pain,N/V or bleeding  Vital Signs: BP 132/68   Pulse (!) 55   SpO2 99%   Physical Exam awake/alert; chest- CTA bilat; heart- RRR, soft murmur; abd- soft,+BS,NT; no sig LE edema  Imaging: CT Abdomen Pelvis W Contrast  Result Date: 12/04/2020 CLINICAL DATA:  Retroperitoneal lymphadenopathy. History of prostate cancer EXAM: CT ABDOMEN AND PELVIS WITH CONTRAST TECHNIQUE: Multidetector CT imaging of the abdomen and pelvis was performed using the standard protocol following bolus administration of intravenous contrast. CONTRAST:  71mL OMNIPAQUE IOHEXOL 350 MG/ML SOLN COMPARISON:  CT 11/11/2020 FINDINGS: Lower chest: Lung bases are clear. Coronary artery calcification and aortic atherosclerotic calcification. Hepatobiliary: No focal hepatic lesion. Postcholecystectomy. No biliary dilatation. Pancreas: Pancreas is normal. No ductal dilatation. No pancreatic inflammation. Spleen: Normal spleen Adrenals/urinary tract: Adrenal glands normal. Bilateral simple fluid attenuation renal cysts. Ureters and bladder normal. Stomach/Bowel: Stomach, duodenum small-bowel normal. Cecum is normal. Anastomosis in the mid sigmoid colon obstruction. Rectum normal Vascular/Lymphatic: Abdominal aorta normal caliber with intimal calcification. Again demonstrated enlarged periaortic lymph nodes. Lymph node LEFT of the aorta at the level of the kidneys measures 2.3 cm compared to 2.2 cm. Node position between the IVC and aorta at the same level measures 1.8 cm compared to 1.9 cm. Adenopathy drapes over the aorta at this level. No periportal adenopathy. Potential enlarged retrocrural node measuring 14 mm image 19/2. Lymph nodes new from CT 2016. No iliac or inguinal adenopathy. Reproductive: Multiple brachytherapy seeds in the prostate gland. Other: No free fluid. Musculoskeletal: Lucencies within lumbar spine unchanged from CT 2016. Endplate degeneration noted. IMPRESSION: 1. Persistent periaortic adenopathy in the upper abdomen.  Normal PSA. Findings concerning for lymphoma such as chronic lymphocytic leukemia. 2. No iliac adenopathy or inguinal  adenopathy. 3. Coronary artery calcification and Aortic Atherosclerosis (ICD10-I70.0). Electronically Signed   By: Suzy Bouchard M.D.   On: 12/04/2020 15:57    Labs:  CBC: Recent Labs    11/12/20 0436 11/13/20 0449 12/05/20 1342 12/17/20 0743  WBC 7.7 7.5 7.3 8.6  HGB 14.6 12.9* 12.0* 12.3*  HCT 43.5 39.3 36.3* 37.3*  PLT 235 212 212 265    COAGS: Recent Labs    12/17/20 0743  INR 1.1    BMP: Recent Labs    12/23/19 1204 06/25/20 1030 11/13/20 0552 11/14/20 0511 12/05/20 1342 12/17/20 0743  NA 140   < > 145 143 141 142  K 4.4   < > 2.8* 3.7 4.1 4.1  CL 103   < > 109 115* 105 108  CO2 28   < > 25 24 26 28   GLUCOSE 88   < > 111* 100* 103* 84  BUN 27*   < > 53* 34* 27* 28*  CALCIUM 9.1   < > 8.1* 7.8* 9.1 9.3  CREATININE 1.10   < > 1.02 0.82 1.37* 1.06  GFRNONAA 62   < > >60 >60 51* >60  GFRAA 72  --   --   --   --   --    < > = values in this interval not displayed.    LIVER FUNCTION TESTS: Recent Labs    11/11/20 0925 11/12/20 0436 11/13/20 0552 12/05/20 1342  BILITOT 1.6* 1.4* 1.0 0.9  AST 15 18 11* 10*  ALT 12 14 11 11   ALKPHOS 73 54 40 72  PROT 7.4 6.7 5.7* 6.4*  ALBUMIN 4.0 3.5 2.8* 3.2*    TUMOR MARKERS: No results for input(s): AFPTM, CEA, CA199, CHROMGRNA in the last 8760 hours.  Assessment and Plan: 86 y.o. male with prior hx prostate cancer 2010(RT/seed implant), CAD/MI/stenting, prior CVA 2017, diverticular dz, HTN, ICM, renal stones who presents now with RP adenopathy of uncertain etiology. Latest PSA is nl. He is scheduled today for image guided biopsy of left para aortic LN for further evaluation.Risks and benefits of procedure was discussed with the patient including, but not limited to bleeding, infection, damage to adjacent structures or low yield requiring additional tests.  All of the questions were answered and  there is agreement to proceed.  Consent signed and in chart.    Thank you for this interesting consult.  I greatly enjoyed meeting Jose Ware and look forward to participating in their care.  A copy of this report was sent to the requesting provider on this date.  Electronically Signed: D. Rowe Robert, PA-C 12/17/2020, 8:29 AM   I spent a total of 25 minutes    in face to face in clinical consultation, greater than 50% of which was counseling/coordinating care for image guided biopsy of left para aortic lymph node

## 2020-12-18 ENCOUNTER — Other Ambulatory Visit: Payer: Self-pay

## 2020-12-18 ENCOUNTER — Encounter: Payer: Self-pay | Admitting: Family Medicine

## 2020-12-18 DIAGNOSIS — G8929 Other chronic pain: Secondary | ICD-10-CM

## 2020-12-18 DIAGNOSIS — M545 Low back pain, unspecified: Secondary | ICD-10-CM

## 2020-12-18 DIAGNOSIS — R262 Difficulty in walking, not elsewhere classified: Secondary | ICD-10-CM

## 2020-12-18 DIAGNOSIS — M6281 Muscle weakness (generalized): Secondary | ICD-10-CM

## 2020-12-19 LAB — SURGICAL PATHOLOGY

## 2020-12-20 ENCOUNTER — Other Ambulatory Visit: Payer: Self-pay | Admitting: Hematology and Oncology

## 2020-12-20 NOTE — Progress Notes (Signed)
Called patient and discussed this is likely a low grade follicular lymphoma. He will keep his follow up with Dr Lorenso Courier Since he is completely asymptomatic, we may recommend surveillance.  Lisha Vitale

## 2020-12-21 DIAGNOSIS — K5669 Other partial intestinal obstruction: Secondary | ICD-10-CM | POA: Diagnosis not present

## 2020-12-21 DIAGNOSIS — E876 Hypokalemia: Secondary | ICD-10-CM | POA: Diagnosis not present

## 2020-12-21 DIAGNOSIS — I10 Essential (primary) hypertension: Secondary | ICD-10-CM | POA: Diagnosis not present

## 2020-12-21 DIAGNOSIS — I2510 Atherosclerotic heart disease of native coronary artery without angina pectoris: Secondary | ICD-10-CM | POA: Diagnosis not present

## 2020-12-21 DIAGNOSIS — I700 Atherosclerosis of aorta: Secondary | ICD-10-CM | POA: Diagnosis not present

## 2020-12-24 ENCOUNTER — Encounter: Payer: Self-pay | Admitting: Family Medicine

## 2020-12-25 ENCOUNTER — Other Ambulatory Visit: Payer: Self-pay

## 2020-12-25 ENCOUNTER — Ambulatory Visit (INDEPENDENT_AMBULATORY_CARE_PROVIDER_SITE_OTHER): Payer: Medicare Other

## 2020-12-25 DIAGNOSIS — Z23 Encounter for immunization: Secondary | ICD-10-CM

## 2020-12-27 ENCOUNTER — Ambulatory Visit (INDEPENDENT_AMBULATORY_CARE_PROVIDER_SITE_OTHER): Payer: Medicare Other | Admitting: Family Medicine

## 2020-12-27 ENCOUNTER — Other Ambulatory Visit: Payer: Self-pay

## 2020-12-27 ENCOUNTER — Encounter: Payer: Self-pay | Admitting: Family Medicine

## 2020-12-27 VITALS — BP 118/60 | HR 55 | Temp 97.9°F | Ht 73.0 in | Wt 202.0 lb

## 2020-12-27 DIAGNOSIS — E538 Deficiency of other specified B group vitamins: Secondary | ICD-10-CM | POA: Diagnosis not present

## 2020-12-27 DIAGNOSIS — K56600 Partial intestinal obstruction, unspecified as to cause: Secondary | ICD-10-CM

## 2020-12-27 DIAGNOSIS — Z0000 Encounter for general adult medical examination without abnormal findings: Secondary | ICD-10-CM | POA: Diagnosis not present

## 2020-12-27 DIAGNOSIS — I10 Essential (primary) hypertension: Secondary | ICD-10-CM

## 2020-12-27 DIAGNOSIS — M545 Low back pain, unspecified: Secondary | ICD-10-CM

## 2020-12-27 DIAGNOSIS — G4733 Obstructive sleep apnea (adult) (pediatric): Secondary | ICD-10-CM | POA: Diagnosis not present

## 2020-12-27 DIAGNOSIS — Z8546 Personal history of malignant neoplasm of prostate: Secondary | ICD-10-CM

## 2020-12-27 DIAGNOSIS — E785 Hyperlipidemia, unspecified: Secondary | ICD-10-CM

## 2020-12-27 DIAGNOSIS — G8929 Other chronic pain: Secondary | ICD-10-CM

## 2020-12-27 DIAGNOSIS — M6281 Muscle weakness (generalized): Secondary | ICD-10-CM

## 2020-12-27 DIAGNOSIS — R262 Difficulty in walking, not elsewhere classified: Secondary | ICD-10-CM

## 2020-12-27 LAB — COMPREHENSIVE METABOLIC PANEL
ALT: 7 U/L (ref 0–53)
AST: 10 U/L (ref 0–37)
Albumin: 3.7 g/dL (ref 3.5–5.2)
Alkaline Phosphatase: 67 U/L (ref 39–117)
BUN: 24 mg/dL — ABNORMAL HIGH (ref 6–23)
CO2: 31 mEq/L (ref 19–32)
Calcium: 8.6 mg/dL (ref 8.4–10.5)
Chloride: 104 mEq/L (ref 96–112)
Creatinine, Ser: 1.07 mg/dL (ref 0.40–1.50)
GFR: 63.69 mL/min (ref >60.00)
Glucose, Bld: 92 mg/dL (ref 70–99)
Potassium: 3.9 mEq/L (ref 3.5–5.1)
Sodium: 141 mEq/L (ref 135–145)
Total Bilirubin: 1.2 mg/dL (ref 0.2–1.2)
Total Protein: 6 g/dL (ref 6.0–8.3)

## 2020-12-27 LAB — LIPID PANEL
Cholesterol: 66 mg/dL (ref 0–200)
HDL: 31 mg/dL — ABNORMAL LOW (ref >39.00)
LDL Cholesterol: 22 mg/dL (ref 0–99)
NonHDL: 34.64
Total CHOL/HDL Ratio: 2
Triglycerides: 63 mg/dL (ref 0.0–149.0)
VLDL: 12.6 mg/dL (ref 0.0–40.0)

## 2020-12-27 LAB — CBC WITH DIFFERENTIAL/PLATELET
Basophils Absolute: 0 10*3/uL (ref 0.0–0.1)
Basophils Relative: 0.5 % (ref 0.0–3.0)
Eosinophils Absolute: 0.3 10*3/uL (ref 0.0–0.7)
Eosinophils Relative: 3.5 % (ref 0.0–5.0)
HCT: 35.7 % — ABNORMAL LOW (ref 39.0–52.0)
Hemoglobin: 11.9 g/dL — ABNORMAL LOW (ref 13.0–17.0)
Lymphocytes Relative: 14.4 % (ref 12.0–46.0)
Lymphs Abs: 1.1 10*3/uL (ref 0.7–4.0)
MCHC: 33.3 g/dL (ref 30.0–36.0)
MCV: 96.8 fl (ref 78.0–100.0)
Monocytes Absolute: 0.9 10*3/uL (ref 0.1–1.0)
Monocytes Relative: 12.3 % — ABNORMAL HIGH (ref 3.0–12.0)
Neutro Abs: 5.1 10*3/uL (ref 1.4–7.7)
Neutrophils Relative %: 69.3 % (ref 43.0–77.0)
Platelets: 227 10*3/uL (ref 150.0–400.0)
RBC: 3.69 Mil/uL — ABNORMAL LOW (ref 4.22–5.81)
RDW: 14.4 % (ref 11.5–15.5)
WBC: 7.4 10*3/uL (ref 4.0–10.5)

## 2020-12-27 LAB — VITAMIN B12: Vitamin B-12: >1550 pg/mL — ABNORMAL HIGH (ref 211–911)

## 2020-12-27 LAB — PSA: PSA: 0 ng/mL — ABNORMAL LOW (ref 0.10–4.00)

## 2020-12-27 MED ORDER — NITROGLYCERIN 0.4 MG SL SUBL: 0.4000 mg | SUBLINGUAL_TABLET | SUBLINGUAL | 2 refills | Status: DC | PRN

## 2020-12-27 NOTE — Patient Instructions (Addendum)
Health Maintenance Due  Topic Date Due   COLONOSCOPY  - Pleas call GI to plan to have this scheduled.  12/16/2018   COVID-19 Vaccine (5 - Booster) - Recommend getting Omicron/Bivalent booster only at your local pharmacy! Please let us know when you have received this vaccination.  09/08/2020   Please stop by lab before you go If you have mychart- we will send your results within 3 business days of [place] receiving them.  If you do not have mychart- we will call you about results within 5 business days of [place] receiving them.  *please also note that you will see labs on mychart as soon as they post. I will later go in and write notes on them- will say "notes from Dr. Yong Channel"  Hold off on Prevnar 20 for now - we will discuss again next year.  Nitroglycerin refilled today - you may pick this up at your local pharmacy.  Recommended follow up: Return in about 6 months (around 06/27/2021) for a follow-up or sooner if needed.

## 2020-12-27 NOTE — Progress Notes (Signed)
Phone: [phone]   Subjective:  Patient presents today for their annual physical. Chief complaint-noted.   See problem oriented charting- ROS- full  review of systems was completed and negative  except for: runny nose when he eats, loose stools since 1986, urinary frequency and urgency stable, joint pain and back pain stable, easy bruising- prior on plavix- still occurs on aspirin  The following were reviewed and entered/updated in epic: Past Medical History:  Diagnosis Date   Adenocarcinoma of prostate ([place])    XRT & Radiation seed implantation in 2010   Adenomatous colon polyp    CAD (coronary artery disease)    a. 11/04/15:  Acute inferolateral STEMI: S/p emergent DES of a very large LCx with extensive thrombus. Significant residual disease in the proximal LAD and distal RCA. S/p staged PCI of LAD and RCA 8/29.   Cataract    CVA (cerebral infarction)    a. 04/9474: embolic CVA after heart cath    CVA (cerebral infarction)    a. 07/4648: embolic CVA after heart cath    DIVERTICULITIS, HX OF 11/27/2006        DJD (degenerative joint disease)    wrist - R    Hernia    History of blood transfusion 1985   with colon surgery   Hypertension    Ischemic cardiomyopathy    a. EF 25-30% by LV gram on cath 11/04/15. Appeared out of proportion to infarct although infarct was large. EF improved by Echo and now 40-45%.   Myocardial infarction ([place])    NEPHROLITHIASIS, HX OF 11/27/2006         Peripheral neuropathy    Psoriasis    Tenosynovitis    Patient Active Problem List   Diagnosis Date Noted   Drug reaction 02/05/2017    Priority: 3.   Senile purpura ([place]) 02/05/2017    Priority: 3.   History of ventricular tachycardia 11/08/2015    Priority: 3.   History of stomach cancer 12/01/2013    Priority: 3.   Lower back pain 03/24/2013    Priority: 3.   Bradycardia 09/23/2012    Priority: 3.   B12 deficiency 09/17/2010    Priority: 3.   PERIPHERAL NEUROPATHY 10/18/2007     Priority: 3.   DEGENERATIVE JOINT DISEASE 11/27/2006    Priority: 3.   NEPHROLITHIASIS, HX OF 11/27/2006    Priority: 3.   Pain of back and left lower extremity 06/08/2019    Priority: 3.   Left knee pain 06/06/2019    Priority: 3.   Onychomycosis 10/28/2017    Priority: 3.   Spondylosis 06/25/2017    Priority: 3.   Post-traumatic osteoarthritis of right wrist 04/25/2015    Priority: 3.   Partial small bowel obstruction ([place]) 11/13/2020    Priority: High   CAD S/P PCI     Priority: High   Ischemic cardiomyopathy     Priority: High   History of embolic stroke     Priority: High   Primary hyperparathyroidism ([place]) 12/28/2013    Priority: High   Fatigue 12/01/2013    Priority: High   History of prostate cancer 12/14/2008    Priority: High   OSA (obstructive sleep apnea) 05/08/2020    Priority: Medium    Post herpetic neuralgia 06/20/2017    Priority: Medium    Spinal stenosis of lumbar region at multiple levels 04/30/2017    Priority: Medium    Dyslipidemia 11/16/2015    Priority: Medium    History of adenomatous polyp  of colon 01/18/2015    Priority: Medium    PSORIASIS 10/15/2007    Priority: Medium    Essential hypertension 11/27/2006    Priority: Medium    Past Surgical History:  Procedure Laterality Date   [place]   minor disk surgery: instrumentation placed and removed in a second procedure   CARDIAC CATHETERIZATION N/A 11/04/2015   Procedure: Left Heart Cath and Coronary Angiography;  Surgeon: Peter M Martinique, MD;  Location: [facility];  Service: Cardiovascular;  Laterality: N/A;   CARDIAC CATHETERIZATION N/A 11/04/2015   Procedure: Coronary Stent Intervention;  Surgeon: Peter M Martinique, MD;  Location: [facility];  Service: Cardiovascular;  Laterality: N/A;   CARDIAC CATHETERIZATION N/A 11/06/2015   Procedure: Coronary Stent Intervention;  Surgeon: Leonie Man, MD;  Location: [facility];  Service:  Cardiovascular;  Laterality: N/A;   CATARACT EXTRACTION, BILATERAL  2013   CHOLECYSTECTOMY  1986   COLON SURGERY  1980's   pt. had ileus, had colon surgery, requiring colostomy & then reversal & then dehisence of that wound & return to OR for repair & cholecystectomy     COLONOSCOPY     COLOSTOMY  1985   After colectomy for diverticulitis, pt. remarks during this surgery they "gave me the paddles two times  because of bleeding", pt. states it was unrelated to any anesthesia complication   EYE SURGERY     /w IOL   KNEE ARTHROSCOPY Left 2003   [place], Right w/cartilage removed later   PARATHYROIDECTOMY N/A 05/12/2014   Procedure: PARATHYROIDECTOMY;  Surgeon: Armandina Gemma, MD;  Location: [facility];  Service: General;  Laterality: N/A;   REVERSAL OF COLOSTOMY  1987   TONSILLECTOMY  1946   WRIST SURGERY Left    Remote complicated w/infection    Family History  Problem Relation Age of Onset   Coronary artery disease Mother    Alcohol abuse Father    Cirrhosis Father    Heart failure Sister    Breast cancer Sister        W/involvement of right arm leading to amputation   Coronary artery disease Brother    Heart attack Brother    Non-Hodgkin's lymphoma Brother    Clotting disorder Brother    Non-Hodgkin's lymphoma Brother    Anemia Sister        related to treatment to lymphoma   Non-Hodgkin's lymphoma Sister    Prostate cancer Neg Hx    Colon cancer Neg Hx    Diabetes Neg Hx    Glaucoma Neg Hx    Rectal cancer Neg Hx    Esophageal cancer Neg Hx     Medications- reviewed and updated Current Outpatient Medications  Medication Sig Dispense Refill   acetaminophen (TYLENOL) 650 MG CR tablet Take 650 mg by mouth 2 (two) times daily as needed for pain (headache).     amLODipine (NORVASC) 5 MG tablet Take 1 tablet by mouth once daily 90 tablet 0   aspirin EC 81 MG tablet Take 1 tablet (81 mg total) by mouth daily. 90 tablet 1   atorvastatin (LIPITOR) 20 MG tablet  Take 1 tablet (20 mg total) by mouth daily. (Patient taking differently: Take 20 mg by mouth daily with supper.) 90 tablet 3   beta carotene w/minerals (OCUVITE) tablet Take 1 tablet by mouth every morning.     carvedilol (COREG) 3.125 MG tablet Take 1  tablet by mouth twice daily 180 tablet 3   Cyanocobalamin (VITAMIN B-12) 5000 MCG TBDP Take 5,000 mcg by mouth every morning.     losartan (COZAAR) 100 MG tablet Take 1 tablet by mouth once daily (Patient taking differently: Take 100 mg by mouth every morning.) 90 tablet 3   spironolactone (ALDACTONE) 25 MG tablet Take 1 tablet by mouth once daily (Patient taking differently: Take 25 mg by mouth every morning.) 90 tablet 1   nitroGLYCERIN (NITROSTAT) 0.4 MG SL tablet Place 1 tablet (0.4 mg total) under the tongue every 5 (five) minutes x 3 doses as needed for chest pain. 25 tablet 2   No current facility-administered medications for this visit.    Allergies-reviewed and updated Allergies  Allergen Reactions   Bee Venom Swelling    Facial swelling   Betadine [Povidone Iodine] Other (See Comments)    blistering   Doxycycline     Possible drug rash- back of right lower leg and onto left lower leg as well    Social History   Social History Narrative   [place].   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 Oropharynx normal Neck: no thyromegaly CV: RRR no murmurs rubs or gallops Lungs:  CTAB no crackles, wheeze, rhonchi Abdomen: soft/nontender/nondistended/normal bowel sounds. No rebound or guarding.  Ext: trace edema Skin: warm, dry Neuro: grossly normal, moves all extremities, PERRLA, walks with walking stick- some imbalance without this- assisted on and off table- states stick mainly for balance- was able to stop using walker    Assessment and Plan  86 y.o. male presenting for annual physical.  Health Maintenance counseling: 1. Anticipatory guidance: Patient counseled regarding regular dental exams -q6 months, eye exams -twice yearly,  avoiding smoking and second hand smoke , limiting alcohol to 2 beverages per day - not at all. No illicit drugs. 2. Risk factor reduction:  Advised patient of need for regular exercise and diet rich and fruits and vegetables to reduce risk of heart attack and stroke. Exercise- gym 2-3 days per week prior to hospital- plans to rebuild- will start with PT. Diet-reasonably healthy diet- did lose weight in hospital but has regained some of this. .  Wt Readings from Last 3 Encounters:  12/27/20 202 lb (91.6 kg)  12/05/20 198 lb 11.2 oz (90.1 kg)  11/11/20 207 lb 3.7 oz (94 kg)  3. Immunizations/screenings/ancillary studies- discussed Omicron/Bivalent booster-trying to schedule - otherwise up-to-date. -discuss doing prevnar 20 next year since already had prevnar 13 and pneumovax 23 Immunization History  Administered Date(s) Administered   Fluad Quad(high Dose 65+) 12/17/2018, 12/23/2019, 12/25/2020   Influenza Split 01/06/2012   Influenza Whole 12/19/2008, 12/27/2009   Influenza, High Dose Seasonal PF 12/21/2012, 12/22/2014, 02/05/2017, 12/08/2017   Influenza,inj,Quad PF,6+ Mos 12/01/2013,  10/24/2015   PFIZER(Purple Top)SARS-COV-2 Vaccination 04/07/2019, 04/21/2019, 12/08/2019, 07/14/2020   Pneumococcal Conjugate-13 09/14/2015   Pneumococcal Polysaccharide-23 02/12/2009   Td 03/12/2003   Tdap 02/27/2014   Zoster Recombinat (Shingrix)  11/24/2017, 01/26/2018  4. Prostate cancer follow up-  we check PSAs.  still seeing Dr. Duaine Dredge but we follow PSA for him  Lab Results  Component Value Date   PSA <0.04 12/23/2019   PSA 0.00 (L) 12/17/2018   PSA <0.015 01/22/2016   5. Colon cancer screening -adenomatous polyp in 2019 with 1 year follow-up planned-originally first 2021 due to insurance post-COVID but was not completed in 2021-now with lymphoma work-up/hospitalization was delayed again- plans to call to schedule 6. Skin cancer screening- no dermatologist since Dr. Sherrye Payor retired- wants to hold off on visit for now.advised regular sunscreen use. Denies worrisome, changing, or new skin lesions.  7. Smoking associated screening (lung cancer screening, AAA screen 65-75, UA)- former smoker- quit in 1980s - no regular screening required 8. STD screening - declines as monogamous  Status of chronic or acute concerns   # Hospital F/U for Cellulitis of the Left Upper Extremity S: Patient was admitted into the ED on 11/11/2020 and presented with nausea/vomiting, umbilical abdominal pain. Pain was Stabbing, episodic, and non-radiating and would become diaphoretic and have nausea/vomiting when it occurred. Also complained of LUE swelling after working in the garden - arm became itchy and swollen afterwards. He tried benadryl and cortisone - helped the itching, but not the redness and erythema.  Treated with ceftriaxone for cellulitis-Left upper extremity cellulitis was resolved and venous duplex of upper left extremity was negative.  Patient had a partial small bowel obstruction versus ileus - abdominal x-ray showed partial small bowel obstruction. Did improve significantly and he was having bowel movements while tolerating advancement in diet.  Retroperitoneal lymphadenopathy was noted on CT abdomen/pelvis- he does have history of prostate cancer. ER called and discussed with oncologist Dr. Chryl Heck and she recommended to obtain a CT chest -  this was performed and showed no evidence of primary malignancy, coronary artery disease was noted, and there were small peripheral high density nodules in the right upper lobe are likely post infectious. PSA was less than 0.01, LDH-111. Patient had been seen by oncology and was recommend to follow-up as outpatient for further work-up with PET/CT and biopsy.  Ultimately patient had a biopsy on 12/17/2020 which showed follicular lymphoma-patient has follow-up with oncology soon- see below   Patient was to restart home medications for hypertension. He was to also start a full liquid diet and slowly advance to soft/low residue foods. For his bowel regimen patient was recommended to use Miralax BID and increase activity slowly.  Today he reports no further issues with cellulitis. No further abdominal issues- he will monitor A/P: Cellulitis resolved-patient will monitor for recurrence  Thankfully small bowel obstruction resolved  In regards to retroperitoneal lymphadenopathy this was ultimately caused by follicular lymphoma (non hodgkins lymphoma)-patient has follow-up with hematology oncology soon to discuss next steps - sees Dr. Lorenso Courier November 2nd- tentative plan is to monitor every 6 months with imaging- strongly prefers to avoid surgery.   #OSA on CPAP- started sept 2022 follows with Dr. Halford Chessman- has appointment tomorrow   #Hypertension S: Compliant with losartan 100 mg daily in the AM, spironolactone 25 mg daily in the AM, Coreg 3.125 mg twice daily, amlodipine 5 mg daily  BP Readings from Last 3 Encounters:  12/27/20 118/60  12/17/20 (!) 141/69  12/05/20 (!) 118/44  A/P: Blood pressure well controlled-continue current medication  #Hyperlipidemia/CAD status post PCI after STEMI August 2017 S: Patient is compliant with atorvastatin 40 mg--> reduce to 20 mg October 2021 with LDL at 25. LDL has been well under 70. He is also compliant with aspirin 81 mg- prior was on Plavix as well but had easy  bruising/bleeding - no CP. More fatigued after hospitalization. Not having to use nitro but needs refill Lab Results  Component Value Date   CHOL 73 12/23/2019   HDL 35 (L) 12/23/2019   LDLCALC 25 12/23/2019   LDLDIRECT 33.0 06/25/2020   TRIG 57 12/23/2019   CHOLHDL 2.1 12/23/2019  A/P: CAD is asymptomatic ( I think mild SOB from being in hospital/deconditioning)-continue current medication  For hyperlipidemia LDL goal under 70-update LDL with reduced atorvastatin dose-potentially could even reduce further such as 10 mg if LDL still less than 40 such as it was in April  Other notes: 1. Senile purpura-stable-continue to monitor on aspirin alone. 2. History primary hyperparathyroidism- treated with parathyroid surgery but will continue to monitor calcium levels  3. B12 deficiency-takes over-the-counter B12 supplement  Recommended follow up: No follow-ups on file. Future Appointments  Date Time Provider [place]  12/28/2020  2:00 PM Chesley Mires, MD [facility] None  01/04/2021 11:00 AM Lyndee Hensen, PT [facility]  01/09/2021  2:40 PM Orson Slick, MD [facility] None  04/15/2021  3:20 PM Martinique, Peter M, MD [facility]  05/17/2021 10:15 AM [facility] HEALTH COACH [facility]   Lab/Order associations: Not fasting   ICD-10-CM   1. Preventative health care  Z00.00     2. Essential hypertension  I10 CBC with Differential/Platelet    Comprehensive metabolic panel    Lipid panel    3. Hyperlipidemia, unspecified hyperlipidemia type  E78.5     4. History of prostate cancer  Z85.46 PSA    5. Dyslipidemia  E78.5 CBC with Differential/Platelet    Comprehensive metabolic panel    Lipid panel    6. Partial small bowel obstruction ([facility])  K56.600     7. B12 deficiency  E53.8 Vitamin B12     Meds ordered this encounter  Medications   nitroGLYCERIN (NITROSTAT) 0.4 MG SL tablet    Sig: Place 1 tablet (0.4 mg total) under the tongue every 5 (five) minutes x 3  doses as needed for chest pain.    Dispense:  25 tablet    Refill:  2    I,Harris Phan,acting as a scribe for Garret Reddish, MD.,have documented all relevant documentation on the behalf of Garret Reddish, MD,as directed by  Garret Reddish, MD while in the presence of Garret Reddish, MD.   I, Garret Reddish, MD, have reviewed all documentation for this visit. The documentation on 12/27/20 for the exam, diagnosis, procedures, and orders are all accurate and complete.   Return precautions advised.  Garret Reddish, MD

## 2020-12-28 ENCOUNTER — Encounter: Payer: Self-pay | Admitting: Pulmonary Disease

## 2020-12-28 ENCOUNTER — Telehealth: Payer: Self-pay

## 2020-12-28 ENCOUNTER — Ambulatory Visit: Payer: Medicare Other | Admitting: Pulmonary Disease

## 2020-12-28 VITALS — BP 124/60 | HR 61 | Temp 98.5°F | Ht 73.0 in | Wt 204.0 lb

## 2020-12-28 DIAGNOSIS — G4733 Obstructive sleep apnea (adult) (pediatric): Secondary | ICD-10-CM

## 2020-12-28 DIAGNOSIS — J310 Chronic rhinitis: Secondary | ICD-10-CM | POA: Diagnosis not present

## 2020-12-28 MED ORDER — ATORVASTATIN CALCIUM 10 MG PO TABS: 10.0000 mg | ORAL_TABLET | Freq: Every day | ORAL | 3 refills | Status: DC

## 2020-12-28 NOTE — Telephone Encounter (Signed)
Please see results note.

## 2020-12-28 NOTE — Telephone Encounter (Signed)
Pt called returned phone call for lab results. Please Advise.

## 2020-12-28 NOTE — Progress Notes (Signed)
[facility]  Chief Complaint  Patient presents with   Follow-up    OSA    Constitutional:  BP 124/60 (BP Location: Left Arm, Cuff Size: Normal)   Pulse 61   Temp 98.5 F (36.9 C) (Oral)   Ht 6\' 1"  (1.854 m)   Wt 204 lb (92.5 kg)   SpO2 98%   BMI 26.91 kg/m   Past Medical History:  Prostate cancer, Colon polyps, CAD, Cataract, CVA, Diverticulitis, DJD, HTN, Ischemic CM, Nephrolithiasis, Neuropathy, Psoriasis  Past Surgical History:  He  has a past surgical history that includes Cholecystectomy (1986); REVERSAL OF COLOSTOMY (1987); Wrist surgery (Left); Colostomy (1985); Knee arthroscopy (Left, 2003); Appendectomy (1985); Tonsillectomy (1946); Knee surgery (1956); Cataract extraction, bilateral (2013); Back surgery (1980); Eye surgery; Colon surgery (1980's); Parathyroidectomy (N/A, 05/12/2014); Colonoscopy; Cardiac catheterization (N/A, 11/04/2015); Cardiac catheterization (N/A, 11/04/2015); and Cardiac catheterization (N/A, 11/06/2015).  Brief Summary:  Jose Ware is a 86 y.o. male former smoker with sleep difficulties.       Subjective:   He had home sleep study in February.  Showed moderate sleep apnea.  Was in hospital in September for cellulitis.  Found to have LN swelling in abdomen and found to have follicular lymphoma.  These delayed his CPAP set up.  He uses this nightly.  Tried nasal pillows but had mouth leak.  Now using full face mask.  No issues with pressure setting.  Physical Exam:   Appearance - well kempt   ENMT - no sinus tenderness, no oral exudate, no LAN, Mallampati 3 airway, no stridor  Respiratory - equal breath sounds bilaterally, no wheezing or rales  CV - s1s2 regular rate and rhythm, no murmurs  Ext - no clubbing, no edema  Skin - no rashes  Psych - normal mood and affect    Sleep Tests:  PSG 01/09/10 >> AHI 1.5 HST 04/11/20 >> AHI 24.4, SpO2 low 82% Auto CPAP 11/27/20 to 12/26/20 >> used on  30 of 30 nights with average 5 hrs 51 min.  Average AHI 3.5 with median CPAP 8 and 95 th percentile CPAP 11 cm H2O  Cardiac Tests:  Echo 11/05/15 >> EF 45 to 50%, mod LVH, grade 1 DD  Social History:  He  reports that he quit smoking about 37 years ago. His smoking use included cigarettes. He has a 74.00 pack-year smoking history. He has never used smokeless tobacco. He reports current alcohol use. He reports that he does not use drugs.  Family History:  His family history includes Alcohol abuse in his father; Anemia in his sister; Breast cancer in his sister; Cirrhosis in his father; Clotting disorder in his brother; Coronary artery disease in his brother and mother; Heart attack in his brother; Heart failure in his sister; Non-Hodgkin's lymphoma in his brother, brother, and sister.     Assessment/Plan:   Obstructive sleep apnea. - he is compliant with therapy and reports benefit from CPAP - he uses [employer] for his DME - continue auto CPAP 5 to 15 cm H2O  CPAP rhinitis. - advised him to increase setting on CPAP humidifier - can try flonase if this gets worse  Follicular lymphoma. - followed by Dr. Teena Irani with Oncology  Time Spent Involved in Patient Care on Day of Examination:  23 minutes  Follow up:   Patient Instructions  Follow up in 1 year Medication List:   Allergies as of 12/28/2020       Reactions  Bee Venom Swelling   Facial swelling   Betadine [povidone Iodine] Other (See Comments)   blistering   Doxycycline    Possible drug rash- back of right lower leg and onto left lower leg as well        Medication List        Accurate as of December 28, 2020  2:06 PM. If you have any questions, ask your nurse or doctor.          acetaminophen 650 MG CR tablet Commonly known as: TYLENOL Take 650 mg by mouth 2 (two) times daily as needed for pain (headache).   amLODipine 5 MG tablet Commonly known as: NORVASC Take 1 tablet by mouth once daily    aspirin EC 81 MG tablet Take 1 tablet (81 mg total) by mouth daily.   atorvastatin 20 MG tablet Commonly known as: LIPITOR Take 1 tablet (20 mg total) by mouth daily. What changed: when to take this   beta carotene w/minerals tablet Take 1 tablet by mouth every morning.   carvedilol 3.125 MG tablet Commonly known as: COREG Take 1 tablet by mouth twice daily   losartan 100 MG tablet Commonly known as: COZAAR Take 1 tablet by mouth once daily What changed: when to take this   nitroGLYCERIN 0.4 MG SL tablet Commonly known as: NITROSTAT Place 1 tablet (0.4 mg total) under the tongue every 5 (five) minutes x 3 doses as needed for chest pain.   spironolactone 25 MG tablet Commonly known as: ALDACTONE Take 1 tablet by mouth once daily What changed: when to take this   Vitamin B-12 5000 MCG Tbdp Take 5,000 mcg by mouth every morning.        Signature:  Chesley Mires, MD [place] Pager - [phone] 12/28/2020, 2:06 PM

## 2020-12-28 NOTE — Addendum Note (Signed)
Addended by: Nilda Riggs on: 12/28/2020 04:36 PM   Modules accepted: Orders

## 2020-12-28 NOTE — Patient Instructions (Signed)
Follow up in 1 year.

## 2020-12-31 ENCOUNTER — Encounter: Payer: Self-pay | Admitting: Family Medicine

## 2021-01-04 ENCOUNTER — Encounter: Payer: Self-pay | Admitting: Physical Therapy

## 2021-01-04 ENCOUNTER — Ambulatory Visit: Payer: Medicare Other | Admitting: Physical Therapy

## 2021-01-04 ENCOUNTER — Other Ambulatory Visit: Payer: Self-pay

## 2021-01-04 DIAGNOSIS — R2689 Other abnormalities of gait and mobility: Secondary | ICD-10-CM | POA: Diagnosis not present

## 2021-01-04 DIAGNOSIS — M6281 Muscle weakness (generalized): Secondary | ICD-10-CM

## 2021-01-05 ENCOUNTER — Other Ambulatory Visit: Payer: Self-pay | Admitting: Cardiology

## 2021-01-08 DIAGNOSIS — G4733 Obstructive sleep apnea (adult) (pediatric): Secondary | ICD-10-CM | POA: Diagnosis not present

## 2021-01-09 ENCOUNTER — Other Ambulatory Visit: Payer: Self-pay

## 2021-01-09 ENCOUNTER — Inpatient Hospital Stay: Payer: Medicare Other | Attending: Hematology and Oncology | Admitting: Hematology and Oncology

## 2021-01-09 VITALS — BP 110/64 | HR 61 | Temp 96.5°F | Resp 17 | Wt 200.3 lb

## 2021-01-09 DIAGNOSIS — Z803 Family history of malignant neoplasm of breast: Secondary | ICD-10-CM | POA: Insufficient documentation

## 2021-01-09 DIAGNOSIS — Z87891 Personal history of nicotine dependence: Secondary | ICD-10-CM | POA: Insufficient documentation

## 2021-01-09 DIAGNOSIS — C8290 Follicular lymphoma, unspecified, unspecified site: Secondary | ICD-10-CM | POA: Insufficient documentation

## 2021-01-09 DIAGNOSIS — I10 Essential (primary) hypertension: Secondary | ICD-10-CM | POA: Diagnosis not present

## 2021-01-09 DIAGNOSIS — R590 Localized enlarged lymph nodes: Secondary | ICD-10-CM | POA: Insufficient documentation

## 2021-01-09 DIAGNOSIS — Z807 Family history of other malignant neoplasms of lymphoid, hematopoietic and related tissues: Secondary | ICD-10-CM | POA: Diagnosis not present

## 2021-01-09 NOTE — Progress Notes (Signed)
[place] [phone]   Fax:([phone]  PROGRESS NOTE  Patient Care Team: [name], MD as PCP - General (Family Medicine) Martinique, Peter M, MD as PCP - Cardiology (Cardiology) Lowella Bandy, MD (Inactive) (Urology) Ladene Artist, MD (Gastroenterology) Martinique, Peter M, MD as Consulting Physician (Cardiology) Keene Breath., MD as Consulting Physician (Ophthalmology) Madelin Rear, [facility] as Pharmacist (Pharmacist) Festus Aloe, MD as Consulting Physician (Urology)  Hematological/Oncological History # Follicular Lymphoma Stage I-II 12/04/2020: CT C/A/P shows periaortic adenopathy in the upper abdomen. No evidence of disease in the chest 12/17/2020: biopsy of lymph node shows result consistent with follicular lymphoma. 01/09/2021: transition care to Dr. Lorenso Courier   Interval History:  Jose Ware 86 y.o. male with medical history significant for newly diagnosed early stage follicular lymphoma who presents for a follow up visit. The patient's last visit was on 12/20/2020 with Dr. Chryl Heck. In the interim since the last visit he has undergone a biopsy which confirmed the diagnosis of follicular lymphoma.  On exam today Jose Ware is accompanied by his wife.  He reports that he feels well overall and has been having no changes in his health.  He does have chronic back pain but otherwise his weight has been stable and his appetite has been good.  He has been struggling with some fatigue and low energy.  His weight is increased to 200 pounds and this is "the first time since freshman year".  He notes that he otherwise is not having any fevers, chills, sweats, nausea, vomiting or diarrhea.  Full 10 point ROS is listed below.  MEDICAL HISTORY:  Past Medical History:  Diagnosis Date   Adenocarcinoma of prostate [facility])    XRT & Radiation seed implantation in 2010   Adenomatous colon polyp    CAD (coronary artery disease)    a. 11/04/15:  Acute  inferolateral STEMI: S/p emergent DES of a very large LCx with extensive thrombus. Significant residual disease in the proximal LAD and distal RCA. S/p staged PCI of LAD and RCA 8/29.   Cataract    CVA (cerebral infarction)    a. 11/3714: embolic CVA after heart cath    CVA (cerebral infarction)    a. 11/6787: embolic CVA after heart cath    DIVERTICULITIS, HX OF 11/27/2006        DJD (degenerative joint disease)    wrist - R    Hernia    History of blood transfusion 1985   with colon surgery   Hypertension    Ischemic cardiomyopathy    a. EF 25-30% by LV gram on cath 11/04/15. Appeared out of proportion to infarct although infarct was large. EF improved by [place] and now 40-45%.   Myocardial infarction ([place])    NEPHROLITHIASIS, HX OF 11/27/2006         Peripheral neuropathy    Psoriasis    Tenosynovitis     SURGICAL HISTORY: Past Surgical History:  Procedure Laterality Date   [place]   minor disk surgery: instrumentation placed and removed in a second procedure   CARDIAC CATHETERIZATION N/A 11/04/2015   Procedure: Left Heart Cath and Coronary Angiography;  Surgeon: Peter M Martinique, MD;  Location: [place] [facility];  Service: Cardiovascular;  Laterality: N/A;   CARDIAC CATHETERIZATION N/A 11/04/2015   Procedure: Coronary Stent Intervention;  Surgeon: Peter M Martinique, MD;  Location: [place] [facility];  Service: Cardiovascular;  Laterality: N/A;   CARDIAC CATHETERIZATION  N/A 11/06/2015   Procedure: Coronary Stent Intervention;  Surgeon: Leonie Man, MD;  Location: [place] [facility];  Service: Cardiovascular;  Laterality: N/A;   CATARACT EXTRACTION, BILATERAL  2013   CHOLECYSTECTOMY  1986   COLON SURGERY  1980's   pt. had ileus, had colon surgery, requiring colostomy & then reversal & then dehisence of that wound & return to OR for repair & cholecystectomy     COLONOSCOPY     COLOSTOMY  1985   After colectomy for diverticulitis, pt. remarks during  this surgery they "gave me the paddles two times  because of bleeding", pt. states it was unrelated to any anesthesia complication   EYE SURGERY     /w IOL   KNEE ARTHROSCOPY Left 2003   [place], Right w/cartilage removed later   PARATHYROIDECTOMY N/A 05/12/2014   Procedure: PARATHYROIDECTOMY;  Surgeon: Armandina Gemma, MD;  Location: [place];  Service: General;  Laterality: N/A;   REVERSAL OF COLOSTOMY  1987   TONSILLECTOMY  1946   WRIST SURGERY Left    Remote complicated w/infection    SOCIAL HISTORY: Social History   Socioeconomic History   Marital status: Married    Spouse name: Not on file   Number of children: 3   Years of education: Not on file   Highest education level: Not on file  Occupational History   Not on file  Tobacco Use   Smoking status: Former    Packs/day: 2.00    Years: 37.00    Pack years: 74.00    Types: Cigarettes    Quit date: 03/11/1983    Years since quitting: 37.8   Smokeless tobacco: Never   Tobacco comments:    started smoking in the service; ICU 85' part of his stomach; appendix; coma   Vaping Use   Vaping Use: Never used  Substance and Sexual Activity   Alcohol use: Yes    Alcohol/week: 0.0 standard drinks    Comment: RARE   Drug use: No   Sexual activity: Not on file  Other Topics Concern   Not on file  Social History Narrative   [place], [place].   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      Hobbies: fishing, busy volunteering            Social Determinants of Radio broadcast assistant Strain: Low Risk    Difficulty of Paying Living Expenses: Not hard at all  Food Insecurity: No Food Insecurity   Worried About Charity fundraiser in the  Last Year: Never true   Arboriculturist in the Last Year: Never true  Transportation Needs: No Transportation Needs   Lack of Transportation (Medical): No   Lack of Transportation (Non-Medical): No  Physical Activity: Sufficiently Active   Days of Exercise per Week: 4 days   Minutes of Exercise per Session: 60 min  Stress: No Stress Concern Present   Feeling of Stress : Not at all  Social Connections: Moderately Integrated   Frequency of Communication with Friends and Family: Once a week   Frequency of Social Gatherings with Friends and Family: Once  a week   Attends Religious Services: More than 4 times per year   Active Member of Clubs or Organizations: Yes   Attends Archivist Meetings: 1 to 4 times per year   Marital Status: Married  Human resources officer Violence: Not At Risk   Fear of Current or Ex-Partner: No   Emotionally Abused: No   Physically Abused: No   Sexually Abused: No    FAMILY HISTORY: Family History  Problem Relation Age of Onset   Coronary artery disease Mother    Alcohol abuse Father    Cirrhosis Father    Heart failure Sister    Breast cancer Sister        W/involvement of right arm leading to amputation   Coronary artery disease Brother    Heart attack Brother    Non-Hodgkin's lymphoma Brother    Clotting disorder Brother    Non-Hodgkin's lymphoma Brother    Anemia Sister        related to treatment to lymphoma   Non-Hodgkin's lymphoma Sister    Prostate cancer Neg Hx    Colon cancer Neg Hx    Diabetes Neg Hx    Glaucoma Neg Hx    Rectal cancer Neg Hx    Esophageal cancer Neg Hx     ALLERGIES:  is allergic to bee venom, betadine [povidone iodine], and doxycycline.  MEDICATIONS:  Current Outpatient Medications  Medication Sig Dispense Refill   acetaminophen (TYLENOL) 650 MG CR tablet Take 650 mg by mouth 2 (two) times daily as needed for pain (headache).     amLODipine (NORVASC) 5 MG tablet Take 1 tablet by mouth once daily 90 tablet 0    aspirin EC 81 MG tablet Take 1 tablet (81 mg total) by mouth daily. 90 tablet 1   atorvastatin (LIPITOR) 10 MG tablet Take 1 tablet (10 mg total) by mouth daily. 90 tablet 3   beta carotene w/minerals (OCUVITE) tablet Take 1 tablet by mouth every morning.     carvedilol (COREG) 3.125 MG tablet Take 1 tablet by mouth twice daily 180 tablet 3   Cyanocobalamin (VITAMIN B-12) 5000 MCG TBDP Take 5,000 mcg by mouth every morning.     losartan (COZAAR) 100 MG tablet Take 1 tablet by mouth once daily (Patient taking differently: Take 100 mg by mouth every morning.) 90 tablet 3   nitroGLYCERIN (NITROSTAT) 0.4 MG SL tablet Place 1 tablet (0.4 mg total) under the tongue every 5 (five) minutes x 3 doses as needed for chest pain. 25 tablet 2   spironolactone (ALDACTONE) 25 MG tablet Take 1 tablet by mouth once daily 90 tablet 1   No current facility-administered medications for this visit.    REVIEW OF SYSTEMS:   Constitutional: ( - ) fevers, ( - )  chills , ( - ) night sweats Eyes: ( - ) blurriness of vision, ( - ) double vision, ( - ) watery eyes Ears, nose, mouth, throat, and face: ( - ) mucositis, ( - ) sore throat Respiratory: ( - ) cough, ( - ) dyspnea, ( - ) wheezes Cardiovascular: ( - ) palpitation, ( - ) chest discomfort, ( - ) lower extremity swelling Gastrointestinal:  ( - ) nausea, ( - ) heartburn, ( - ) change in bowel habits Skin: ( - ) abnormal skin rashes Lymphatics: ( - ) new lymphadenopathy, ( - ) easy bruising Neurological: ( - ) numbness, ( - ) tingling, ( - ) new weaknesses Behavioral/Psych: ( - ) mood change, ( - )  new changes  All other systems were reviewed with the patient and are negative.  PHYSICAL EXAMINATION: ECOG PERFORMANCE STATUS: 0 - Asymptomatic  Vitals:   01/09/21 1510  BP: 110/64  Pulse: 61  Resp: 17  Temp: (!) 96.5 F (35.8 C)  SpO2: 97%   Filed Weights   01/09/21 1510  Weight: 200 lb 4.8 oz (90.9 kg)    GENERAL: Well-appearing elderly Caucasian  male, alert, no distress and comfortable SKIN: skin color, texture, turgor are normal, no rashes or significant lesions EYES: conjunctiva are pink and non-injected, sclera clear LUNGS: clear to auscultation and percussion with normal breathing effort HEART: regular rate & rhythm and no murmurs and no lower extremity edema Musculoskeletal: no cyanosis of digits and no clubbing  PSYCH: alert & oriented x 3, fluent speech NEURO: no focal motor/sensory deficits  LABORATORY DATA:  I have reviewed the data as listed CBC Latest Ref Rng & Units 12/27/2020 January 03, 2021 12/05/2020  WBC 4.0 - 10.5 K/uL 7.4 8.6 7.3  Hemoglobin 13.0 - 17.0 g/dL 11.9(L) 12.3(L) 12.0(L)  Hematocrit 39.0 - 52.0 % 35.7(L) 37.3(L) 36.3(L)  Platelets 150.0 - 400.0 K/uL 227.0 265 212    CMP Latest Ref Rng & Units 12/27/2020 01/03/2021 12/05/2020  Glucose 70 - 99 mg/dL 92 84 103(H)  BUN 6 - 23 mg/dL 24(H) 28(H) 27(H)  Creatinine 0.40 - 1.50 mg/dL 1.07 1.06 1.37(H)  Sodium 135 - 145 mEq/L 141 142 141  Potassium 3.5 - 5.1 mEq/L 3.9 4.1 4.1  Chloride 96 - 112 mEq/L 104 108 105  CO2 19 - 32 mEq/L 31 28 26   Calcium 8.4 - 10.5 mg/dL 8.6 9.3 9.1  Total Protein 6.0 - 8.3 g/dL 6.0 - 6.4(L)  Total Bilirubin 0.2 - 1.2 mg/dL 1.2 - 0.9  Alkaline Phos 39 - 117 U/L 67 - 72  AST 0 - 37 U/L 10 - 10(L)  ALT 0 - 53 U/L 7 - 11   RADIOGRAPHIC STUDIES: CT BIOPSY  Result Date: January 03, 2021 INDICATION: 86 year old with indeterminate retroperitoneal lymphadenopathy. Tissue diagnosis is needed. EXAM: CT-GUIDED BIOPSY OF LEFT RETROPERITONEAL LYMPH NODES MEDICATIONS: None. ANESTHESIA/SEDATION: Moderate (conscious) sedation was employed during this procedure. A total of Versed 1.5mg  and fentanyl 100 mcg was administered intravenously at the order of the provider performing the procedure. Total intra-service moderate sedation time: 12 minutes. Patient's level of consciousness and vital signs were monitored continuously by radiology nurse throughout the  procedure under the supervision of the provider performing the procedure. FLUOROSCOPY TIME:  None COMPLICATIONS: None immediate. PROCEDURE: Informed written consent was obtained from the patient after a thorough discussion of the procedural risks, benefits and alternatives. All questions were addressed. A timeout was performed prior to the initiation of the procedure. Patient was placed prone on the CT scanner. CT images through the abdomen were obtained. The left retroperitoneal lymphadenopathy was identified and targeted. The left side of the back was marked. The skin was prepped with chlorhexidine and sterile field was created. Skin and soft tissues were anesthetized using 1% lidocaine. Small incision was made. Using CT guidance, 17 gauge coaxial needle was directed into the left retroperitoneal lymph nodes. Needle position was confirmed within the lymph nodes. Multiple core biopsies were obtained with an 18 gauge core biopsy device. Adequate specimens were obtained and placed in saline. Needle was removed. Follow up CT images were obtained. FINDINGS: Again noted are enlarged left retroperitoneal lymph nodes. Needle position confirmed along the posterior aspect of the lymphadenopathy. Adequate specimens obtained. IMPRESSION: Successful CT-guided core biopsies  of the left retroperitoneal lymphadenopathy. Electronically Signed   By: Markus Daft M.D.   On: 12/17/2020 12:51    ASSESSMENT & PLAN Jose Ware 86 y.o. male with medical history significant for newly diagnosed early stage follicular lymphoma who presents for a follow up visit.   Today we discussed the diagnosis of follicular lymphoma.  Follicular lymphoma tends to be a more indolent lymphoma and therefore treatment is not immediately required.  We discussed the criteria the patient have to meet in order to begin treatment.  This included the GELF criteria.  As the patient is currently low stage and does not meet any of these criteria I would  recommend observation at this time.  The patient and his wife were in agreement with this plan moving forward.  FLIPI Score: Low Risk (Age >60). 10 year OS is 62%  # Follicular Lymphoma Stage I-II -- Findings are consistent with a low-grade lymphoma, most likely follicular lymphoma.  At this time disease appears quite limited in stage --No clear indication to start treatment at this time as patient does not meet any GELF Criteria --Plan for a return to clinic in 3 months time with a return to clinic in CT scan in 6 months time in order to evaluate for progression of disease.  No orders of the defined types were placed in this encounter.   All questions were answered. The patient knows to call the clinic with any problems, questions or concerns.  A total of more than 30 minutes were spent on this encounter with face-to-face time and non-face-to-face time, including preparing to see the patient, ordering tests and/or medications, counseling the patient and coordination of care as outlined above.   Ledell Peoples, MD Department of Hematology/Oncology [place] [facility] Phone: [phone] Pager: [phone] Email: [email or web address]  01/09/2021 5:25 PM

## 2021-01-17 ENCOUNTER — Ambulatory Visit: Payer: Medicare Other | Admitting: Physical Therapy

## 2021-01-17 ENCOUNTER — Other Ambulatory Visit: Payer: Self-pay

## 2021-01-17 DIAGNOSIS — G8929 Other chronic pain: Secondary | ICD-10-CM

## 2021-01-17 DIAGNOSIS — R262 Difficulty in walking, not elsewhere classified: Secondary | ICD-10-CM

## 2021-01-17 DIAGNOSIS — R2689 Other abnormalities of gait and mobility: Secondary | ICD-10-CM | POA: Diagnosis not present

## 2021-01-17 DIAGNOSIS — M545 Low back pain, unspecified: Secondary | ICD-10-CM

## 2021-01-17 DIAGNOSIS — M25562 Pain in left knee: Secondary | ICD-10-CM

## 2021-01-17 DIAGNOSIS — M6281 Muscle weakness (generalized): Secondary | ICD-10-CM

## 2021-01-21 ENCOUNTER — Encounter: Payer: Medicare Other | Admitting: Physical Therapy

## 2021-01-24 ENCOUNTER — Encounter: Payer: Medicare Other | Admitting: Physical Therapy

## 2021-01-27 DIAGNOSIS — G4733 Obstructive sleep apnea (adult) (pediatric): Secondary | ICD-10-CM | POA: Diagnosis not present

## 2021-01-28 ENCOUNTER — Encounter: Payer: Self-pay | Admitting: Physical Therapy

## 2021-01-28 ENCOUNTER — Ambulatory Visit: Payer: Medicare Other | Admitting: Physical Therapy

## 2021-01-28 ENCOUNTER — Other Ambulatory Visit: Payer: Self-pay

## 2021-01-28 DIAGNOSIS — R262 Difficulty in walking, not elsewhere classified: Secondary | ICD-10-CM

## 2021-01-28 DIAGNOSIS — M545 Low back pain, unspecified: Secondary | ICD-10-CM

## 2021-01-28 DIAGNOSIS — M6281 Muscle weakness (generalized): Secondary | ICD-10-CM

## 2021-01-28 DIAGNOSIS — R2689 Other abnormalities of gait and mobility: Secondary | ICD-10-CM

## 2021-01-28 DIAGNOSIS — G8929 Other chronic pain: Secondary | ICD-10-CM

## 2021-01-28 DIAGNOSIS — M25562 Pain in left knee: Secondary | ICD-10-CM

## 2021-01-28 NOTE — Therapy (Signed)
[facility] [address] [place], [place], [address] Phone: [phone]   Fax:  [phone]  Physical Therapy Treatment  Patient Details  Name: Jose Ware MRN: [id number] Date of Birth: Aug 02, 1934 Referring Provider (PT): Garret Reddish   Encounter Date: 01/28/2021   PT End of Session - 01/28/21 2115     Visit Number 3    Number of Visits 16    Date for PT Re-Evaluation 03/01/21    Authorization Type UHC Medicare,  10 visits previously in 2022.    PT Start Time 1100    PT Stop Time 1142    PT Time Calculation (min) 42 min    Equipment Utilized During Treatment Gait belt    Activity Tolerance Patient tolerated treatment well    Behavior During Therapy WFL for tasks assessed/performed             Past Medical History:  Diagnosis Date   Adenocarcinoma of prostate ([place])    XRT & Radiation seed implantation in 2010   Adenomatous colon polyp    CAD (coronary artery disease)    a. 11/04/15:  Acute inferolateral STEMI: S/p emergent DES of a very large LCx with extensive thrombus. Significant residual disease in the proximal LAD and distal RCA. S/p staged PCI of LAD and RCA 8/29.   Cataract    CVA (cerebral infarction)    a. 06/1935: embolic CVA after heart cath    CVA (cerebral infarction)    a. 11/238: embolic CVA after heart cath    DIVERTICULITIS, HX OF 11/27/2006        DJD (degenerative joint disease)    wrist - R    Hernia    History of blood transfusion 1985   with colon surgery   Hypertension    Ischemic cardiomyopathy    a. EF 25-30% by LV gram on cath 11/04/15. Appeared out of proportion to infarct although infarct was large. EF improved by [place] and now 40-45%.   Myocardial infarction ([place])    NEPHROLITHIASIS, HX OF 11/27/2006         Peripheral neuropathy    Psoriasis    Tenosynovitis     Past Surgical History:  Procedure Laterality Date   [place]   minor disk  surgery: instrumentation placed and removed in a second procedure   CARDIAC CATHETERIZATION N/A 11/04/2015   Procedure: Left Heart Cath and Coronary Angiography;  Surgeon: Peter M Martinique, MD;  Location: [place] [facility];  Service: Cardiovascular;  Laterality: N/A;   CARDIAC CATHETERIZATION N/A 11/04/2015   Procedure: Coronary Stent Intervention;  Surgeon: Peter M [place]ique, MD;  Location: [place] [facility];  Service: Cardiovascular;  Laterality: N/A;   CARDIAC CATHETERIZATION N/A 11/06/2015   Procedure: Coronary Stent Intervention;  Surgeon: Leonie Man, MD;  Location: [place] [facility];  Service: Cardiovascular;  Laterality: N/A;   CATARACT EXTRACTION, BILATERAL  2013   CHOLECYSTECTOMY  1986   COLON SURGERY  1980's   pt. had ileus, had colon surgery, requiring colostomy & then reversal & then dehisence of that wound & return to OR for repair & cholecystectomy     COLONOSCOPY     COLOSTOMY  1985   After colectomy for diverticulitis, pt. remarks during this surgery they "gave me the paddles two times  because of bleeding", pt. states it was unrelated to any anesthesia complication   EYE SURGERY     /w IOL   KNEE  ARTHROSCOPY Left 2003   KNEE SURGERY  1956   Left 1956, Right w/cartilage removed later   PARATHYROIDECTOMY N/A 05/12/2014   Procedure: PARATHYROIDECTOMY;  Surgeon: Armandina Gemma, MD;  Location: [facility];  Service: General;  Laterality: N/A;   [place]   WRIST SURGERY Left    Remote complicated w/infection    There were no vitals filed for this visit.   Subjective Assessment - 01/28/21 2114     Subjective Pt states bothersome stiffness in low back and knees. He is limited with standing activity.    Currently in Pain? Yes    Pain Score 5     Pain Location Back    Pain Orientation Left    Pain Descriptors / Indicators Aching    Pain Type Chronic pain    Pain Onset More than a month ago    Pain Frequency Intermittent    Pain Score 3     Pain Location Knee    Pain Orientation Left    Pain Descriptors / Indicators Aching    Pain Type Chronic pain    Pain Onset More than a month ago    Pain Frequency Intermittent                               [facility] Adult PT Treatment/Exercise - 01/28/21 2113       Ambulation/Gait   Gait Comments 35 ft x 8 with cuing for improving step height and stride length      Exercises   Exercises Lumbar      Lumbar Exercises: Stretches   Active Hamstring Stretch 3 reps;30 seconds    Active Hamstring Stretch Limitations seated    Single Knee to Chest Stretch 3 reps;30 seconds    Lower Trunk Rotation 5 reps;10 seconds      Lumbar Exercises: Standing   Row 20 reps    Theraband Level (Row) Level 3 (Green)    Other Standing Lumbar Exercises bwd walking 25 ft x 4;    Other Standing Lumbar Exercises L/R and A/P weight shifts, fwd and bwd steppint with weight shifts x 15 eac bil; Tandem stance (modified) 20 sec x 3 bil;      Lumbar Exercises: Seated   Long Arc Quad on Chair Both;20 reps    LAQ on Chair Weights (lbs) 2    Sit to Stand 10 reps      Lumbar Exercises: Supine   Bridge 20 reps                       PT Short Term Goals - 01/28/21 0946       PT SHORT TERM GOAL #1   Title Pt to be independent with initial HEP    Time 3    Period Weeks    Status New    Target Date 01/25/21               PT Long Term Goals - 01/28/21 0947       PT LONG TERM GOAL #1   Title Pt to be independent with final HEP for LE strength and balance    Time 8    Period Weeks    Status New    Target Date 03/01/21      PT LONG TERM GOAL #2   Title Pt to demo improved strength of bil hips and knees to  at least 4+/5 to improve stability and gait/stairs    Time 8    Period Weeks    Status New    Target Date 03/01/21      PT LONG TERM GOAL #3   Title Pt to demo improved score on 5 time sit to stand by at least 5 seconds to improve efficiency with transfers and  mobility    Time 8    Period Weeks    Status New    Target Date 03/01/21      PT LONG TERM GOAL #4   Title Pt to demo improved score on BERG by at least 6 points, to improve safety with functional mobility    Time 8    Period Weeks    Status New    Target Date 03/01/21      PT LONG TERM GOAL #5   Title Pt to report ability for standing/walking for at least 20 min without back pain more than 3/10, to improve ability for community activity.    Time 8    Period Weeks    Status New    Target Date 03/01/21                   Plan - 01/28/21 2118     Clinical Impression Statement Pt challenged with standing balance today, more difficulty with L Le and posterior stepping. Also quite challenged with tandem stance. Minimal back pain with standing activities today. Pt to benefit from continued strengthening and balance to improve safety and ability for funcitonal activity.    Personal Factors and Comorbidities Comorbidity 1    Comorbidities CVA, MI, CAD, Neuropathy, prostate CA, non hodgkins lymphoma    Examination-Activity Limitations Lift;Stand;Bend;Transfers;Stairs;Squat;Carry    Examination-Participation Restrictions Cleaning;Meal Prep;Community Activity;Shop    Stability/Clinical Decision Making Stable/Uncomplicated    Rehab Potential Good    PT Frequency 2x / week    PT Duration 8 weeks    PT Treatment/Interventions ADLs/Self Care Home Management;Cryotherapy;DME Instruction;Moist Heat;Gait training;Stair training;Functional mobility training;Therapeutic activities;Therapeutic exercise;Balance training;Patient/family education;Neuromuscular re-education;Manual techniques;Passive range of motion;Dry needling;Taping;Vasopneumatic Device;Spinal Manipulations;Joint Manipulations    Consulted and Agree with Plan of Care Patient             Patient will benefit from skilled therapeutic intervention in order to improve the following deficits and impairments:  Abnormal gait,  Pain, Decreased mobility, Decreased strength, Decreased range of motion, Decreased endurance, Decreased activity tolerance, Decreased balance, Difficulty walking  Visit Diagnosis: Muscle weakness (generalized)  Other abnormalities of gait and mobility  Difficulty walking  Chronic pain of left knee  Pain, lumbar region     Problem List Patient Active Problem List   Diagnosis Date Noted   Partial small bowel obstruction ([place]) 11/13/2020   OSA (obstructive sleep apnea) 05/08/2020   Pain of back and left lower extremity 06/08/2019   Left knee pain 06/06/2019   Onychomycosis 10/28/2017   Spondylosis 06/25/2017   Post herpetic neuralgia 06/20/2017   Spinal stenosis of lumbar region at multiple levels 04/30/2017   Drug reaction 02/05/2017   Senile purpura ([place]) 02/05/2017   Dyslipidemia 11/16/2015   History of ventricular tachycardia 11/08/2015   CAD S/P PCI    Ischemic cardiomyopathy    History of embolic stroke    Post-traumatic osteoarthritis of right wrist 04/25/2015   History of adenomatous polyp of colon 01/18/2015   Primary hyperparathyroidism ([place]) 12/28/2013   History of stomach cancer 12/01/2013   Fatigue 12/01/2013   Lower back pain 03/24/2013  Bradycardia 09/23/2012   B12 deficiency 09/17/2010   History of prostate cancer 12/14/2008   PERIPHERAL NEUROPATHY 10/18/2007   PSORIASIS 10/15/2007   Essential hypertension 11/27/2006   DEGENERATIVE JOINT DISEASE 11/27/2006   NEPHROLITHIASIS, HX OF 11/27/2006   Lyndee Hensen, PT, DPT 9:23 PM  01/28/21    [facility] [place], [place], [address] Phone: [phone]   Fax:  [phone]  Name: Jose Ware MRN: [id number] Date of Birth: 11-Jan-1935

## 2021-01-28 NOTE — Therapy (Signed)
[facility] [address] [place], [place], [address] Phone: [phone]   Fax:  [phone]  Physical Therapy Evaluation  Patient Details  Name: Jose Ware MRN: [id number] Date of Birth: [birth year] Referring Provider (PT): Garret Reddish   Encounter Date: 01/04/2021   PT End of Session - 01/28/21 0944     Visit Number 1    Number of Visits 16    Date for PT Re-Evaluation 03/01/21    Authorization Type UHC Medicare,  10 visits previously in 2022.    PT Start Time 1100    PT Stop Time 1140    PT Time Calculation (min) 40 min    Equipment Utilized During Treatment Gait belt    Activity Tolerance Patient tolerated treatment well    Behavior During Therapy WFL for tasks assessed/performed             Past Medical History:  Diagnosis Date   Adenocarcinoma of prostate ([place])    XRT & Radiation seed implantation in 2010   Adenomatous colon polyp    CAD (coronary artery disease)    a. 11/04/15:  Acute inferolateral STEMI: S/p emergent DES of a very large LCx with extensive thrombus. Significant residual disease in the proximal LAD and distal RCA. S/p staged PCI of LAD and RCA 8/29.   Cataract    CVA (cerebral infarction)    a. 11/3901: embolic CVA after heart cath    CVA (cerebral infarction)    a. 0/0923: embolic CVA after heart cath    DIVERTICULITIS, HX OF 11/27/2006        DJD (degenerative joint disease)    wrist - R    Hernia    History of blood transfusion 1985   with colon surgery   Hypertension    Ischemic cardiomyopathy    a. EF 25-30% by LV gram on cath 11/04/15. Appeared out of proportion to infarct although infarct was large. EF improved by Echo and now 40-45%.   Myocardial infarction ([place])    NEPHROLITHIASIS, HX OF 11/27/2006         Peripheral neuropathy    Psoriasis    Tenosynovitis     Past Surgical History:  Procedure Laterality Date   [place]   minor disk  surgery: instrumentation placed and removed in a second procedure   CARDIAC CATHETERIZATION N/A 11/04/2015   Procedure: Left Heart Cath and Coronary Angiography;  Surgeon: Peter M Martinique, MD;  Location: [facility];  Service: Cardiovascular;  Laterality: N/A;   CARDIAC CATHETERIZATION N/A 11/04/2015   Procedure: Coronary Stent Intervention;  Surgeon: Peter M Martinique, MD;  Location: [facility];  Service: Cardiovascular;  Laterality: N/A;   CARDIAC CATHETERIZATION N/A 11/06/2015   Procedure: Coronary Stent Intervention;  Surgeon: Leonie Man, MD;  Location: [facility];  Service: Cardiovascular;  Laterality: N/A;   CATARACT EXTRACTION, BILATERAL  2013   CHOLECYSTECTOMY  1986   COLON SURGERY  1980's   pt. had ileus, had colon surgery, requiring colostomy & then reversal & then dehisence of that wound & return to OR for repair & cholecystectomy     COLONOSCOPY     COLOSTOMY  1985   After colectomy for diverticulitis, pt. remarks during this surgery they "gave me the paddles two times  because of bleeding", pt. states it was unrelated to any anesthesia complication   EYE SURGERY     /w IOL   KNEE  ARTHROSCOPY Left 2003   KNEE SURGERY  1956   Left 1956, Right w/cartilage removed later   PARATHYROIDECTOMY N/A 05/12/2014   Procedure: PARATHYROIDECTOMY;  Surgeon: Armandina Gemma, MD;  Location: [facility];  Service: General;  Laterality: N/A;   [place]   WRIST SURGERY Left    Remote complicated w/infection    There were no vitals filed for this visit.    Subjective Assessment - 01/28/21 0940     Subjective Pt was seen in PT in June. Was in [facility] in September. States decreased mobility after being in hospital. Was getting home health, now over. No recent falls. Is using cane/walking stick now, was using RW, states he is not using now. States knees feel weak L>R.Marland Kitchen Hard time standing due to that and back pain. Back pain/standing. Knee pain:  minimal, but feels weak. Lives with wife, former Marine scientist. One story. 1 step to enter. Is driving. New diagnosis of non hodgkins lymphoma, will see MD soon for this.  Would like to be able to be more active around the house and out of the house.    Pertinent History CVA, MI, CAD, Neuropathy, prostate CA, non hodgkins lymphoma    Patient Stated Goals improve balance    Currently in Pain? Yes    Pain Score 5     Pain Location Back    Pain Orientation Right;Left    Pain Descriptors / Indicators Aching    Pain Type Chronic pain    Pain Onset More than a month ago    Pain Frequency Intermittent    Pain Score 3    Pain Location Knee    Pain Orientation Left    Pain Type Chronic pain    Pain Onset More than a month ago    Pain Frequency Intermittent                [facility] PT Assessment - 01/28/21 0001       Assessment   Medical Diagnosis Gait, Weakness, back pain    Referring Provider (PT) Garret Reddish    Prior Therapy yes      Precautions   Precautions None      Balance Screen   Has the patient fallen in the past 6 months No      Prior Function   Level of Independence Independent with household mobility with device      Cognition   Overall Cognitive Status Within Functional Limits for tasks assessed      AROM   Overall AROM Comments Lumbar: significant limitation for flex/ext, Hips: mod deficits for rotation and extension;  Knees: mild limiation for full extension bilaterally,      Strength   Overall Strength Comments hips: 4/5, Knees: 4/5, Functional strength decreased in L leg, seen on stairs and with balance.      Transfers   Five time sit to stand comments  21.94 with hands on knees, regular height chair.      Ambulation/Gait   Gait Comments using tall walking stick.      Standardized Balance Assessment   Standardized Balance Assessment Berg Balance Test      Berg Balance Test   Sit to Stand Able to stand  independently using hands    Standing Unsupported Able  to stand safely 2 minutes    Sitting with Back Unsupported but Feet Supported on Floor or Stool Able to sit safely and securely 2 minutes    Stand to  Sit Controls descent by using hands    Transfers Able to transfer safely, minor use of hands    Standing Unsupported with Eyes Closed Able to stand 10 seconds with supervision    Standing Unsupported with Feet Together Able to place feet together independently and stand for 1 minute with supervision    From Standing, Reach Forward with Outstretched Arm Can reach forward >12 cm safely (5")    From Standing Position, Pick up Object from [place] to pick up shoe safely and easily    From Standing Position, Turn to Look Behind Over each Shoulder Looks behind one side only/other side shows less weight shift    Turn 360 Degrees Able to turn 360 degrees safely but slowly    Standing Unsupported, Alternately Place Feet on Step/Stool Able to complete 4 steps without aid or supervision    Standing Unsupported, One Foot in Front Needs help to step but can hold 15 seconds    Standing on One Leg Tries to lift leg/unable to hold 3 seconds but remains standing independently    Total Score 40      Timed Up and Go Test   Normal TUG (seconds) 15.7   with cane.                       Objective measurements completed on examination: See above findings.       [facility] Adult PT Treatment/Exercise - 01/28/21 0001       Exercises   Exercises Lumbar      Lumbar Exercises: Stretches   Active Hamstring Stretch 3 reps;30 seconds    Single Knee to Chest Stretch 3 reps;30 seconds    Lower Trunk Rotation 5 reps;10 seconds      Lumbar Exercises: Seated   Long Arc Quad on Chair 10 reps;Both                     PT Education - 01/28/21 0944     Education Details PT POC, Exam findings,    Person(s) Educated Patient    Methods Explanation;Demonstration;Tactile cues;Verbal cues;Handout    Comprehension Verbalized understanding;Returned  demonstration;Verbal cues required;Tactile cues required;Need further instruction              PT Short Term Goals - 01/28/21 0946       PT SHORT TERM GOAL #1   Title Pt to be independent with initial HEP    Time 3    Period Weeks    Status New    Target Date 01/25/21               PT Long Term Goals - 01/28/21 0947       PT LONG TERM GOAL #1   Title Pt to be independent with final HEP for LE strength and balance    Time 8    Period Weeks    Status New    Target Date 03/01/21      PT LONG TERM GOAL #2   Title Pt to demo improved strength of bil hips and knees to at least 4+/5 to improve stability and gait/stairs    Time 8    Period Weeks    Status New    Target Date 03/01/21      PT LONG TERM GOAL #3   Title Pt to demo improved score on 5 time sit to stand by at least 5 seconds to improve efficiency with transfers and mobility  Time 8    Period Weeks    Status New    Target Date 03/01/21      PT LONG TERM GOAL #4   Title Pt to demo improved score on BERG by at least 6 points, to improve safety with functional mobility    Time 8    Period Weeks    Status New    Target Date 03/01/21      PT LONG TERM GOAL #5   Title Pt to report ability for standing/walking for at least 20 min without back pain more than 3/10, to improve ability for community activity.    Time 8    Period Weeks    Status New    Target Date 03/01/21                    Plan - 01/28/21 0953     Clinical Impression Statement Pt presents with primary complaint of decreased mobility, for walking, leg strength, and balance. He has pain and stiffness in back and knees, with decreased ROM and strength. He has decreased standing balance, and decreased safety with walking, stairs, and functional activity. He is limited with standing activity due to pain and balance. He has decreased ability for full functional activities and will benefit from skilled PT to improve deficits and pain.  Reviewed some of previous HEP today, but will update next visit.    Personal Factors and Comorbidities Comorbidity 1    Comorbidities CVA, MI, CAD, Neuropathy, prostate CA, non hodgkins lymphoma    Examination-Activity Limitations Lift;Stand;Bend;Transfers;Stairs;Squat;Carry    Examination-Participation Restrictions Cleaning;Meal Prep;Community Activity;Shop    Stability/Clinical Decision Making Stable/Uncomplicated    Clinical Decision Making Low    Rehab Potential Good    PT Frequency 2x / week    PT Duration 8 weeks    PT Treatment/Interventions ADLs/Self Care Home Management;Cryotherapy;DME Instruction;Moist Heat;Gait training;Stair training;Functional mobility training;Therapeutic activities;Therapeutic exercise;Balance training;Patient/family education;Neuromuscular re-education;Manual techniques;Passive range of motion;Dry needling;Taping;Vasopneumatic Device;Spinal Manipulations;Joint Manipulations    Consulted and Agree with Plan of Care Patient             Patient will benefit from skilled therapeutic intervention in order to improve the following deficits and impairments:  Abnormal gait, Pain, Decreased mobility, Decreased strength, Decreased range of motion, Decreased endurance, Decreased activity tolerance, Decreased balance, Difficulty walking  Visit Diagnosis: Muscle weakness (generalized)  Other abnormalities of gait and mobility     Problem List Patient Active Problem List   Diagnosis Date Noted   Partial small bowel obstruction ([place]) 11/13/2020   OSA (obstructive sleep apnea) 05/08/2020   Pain of back and left lower extremity 06/08/2019   Left knee pain 06/06/2019   Onychomycosis 10/28/2017   Spondylosis 06/25/2017   Post herpetic neuralgia 06/20/2017   Spinal stenosis of lumbar region at multiple levels 04/30/2017   Drug reaction 02/05/2017   Senile purpura ([place]) 02/05/2017   Dyslipidemia 11/16/2015   History of ventricular tachycardia 11/08/2015   CAD  S/P PCI    Ischemic cardiomyopathy    History of embolic stroke    Post-traumatic osteoarthritis of right wrist 04/25/2015   History of adenomatous polyp of colon 01/18/2015   Primary hyperparathyroidism ([place]) 12/28/2013   History of stomach cancer 12/01/2013   Fatigue 12/01/2013   Lower back pain 03/24/2013   Bradycardia 09/23/2012   B12 deficiency 09/17/2010   History of prostate cancer 12/14/2008   PERIPHERAL NEUROPATHY 10/18/2007   PSORIASIS 10/15/2007   Essential hypertension 11/27/2006   DEGENERATIVE JOINT DISEASE  11/27/2006   NEPHROLITHIASIS, HX OF 11/27/2006    Lyndee Hensen, PT, DPT 11:57 AM  01/28/21    [facility] [place], [place], [address] Phone: [phone]   Fax:  [phone]  Name: Jose Ware MRN: [id number] Date of Birth: 10/17/1934

## 2021-01-28 NOTE — Therapy (Signed)
[facility] [address] [place], [place], [address] Phone: [phone]   Fax:  [phone]  Physical Therapy Treatment  Patient Details  Name: Jose Ware MRN: [id number] Date of Birth: 30-Dec-1934 Referring Provider (PT): Garret Reddish   Encounter Date: 01/17/2021   PT End of Session - 01/28/21 2053     Visit Number 2    Number of Visits 16    Date for PT Re-Evaluation 03/01/21    Authorization Type UHC Medicare,  10 visits previously in 2022.    PT Start Time 1100    PT Stop Time 1143    PT Time Calculation (min) 43 min    Equipment Utilized During Treatment Gait belt    Activity Tolerance Patient tolerated treatment well    Behavior During Therapy WFL for tasks assessed/performed             Past Medical History:  Diagnosis Date   Adenocarcinoma of prostate ([place])    XRT & Radiation seed implantation in 2010   Adenomatous colon polyp    CAD (coronary artery disease)    a. 11/04/15:  Acute inferolateral STEMI: S/p emergent DES of a very large LCx with extensive thrombus. Significant residual disease in the proximal LAD and distal RCA. S/p staged PCI of LAD and RCA 8/29.   Cataract    CVA (cerebral infarction)    a. 07/9561: embolic CVA after heart cath    CVA (cerebral infarction)    a. 10/7562: embolic CVA after heart cath    DIVERTICULITIS, HX OF 11/27/2006        DJD (degenerative joint disease)    wrist - R    Hernia    History of blood transfusion 1985   with colon surgery   Hypertension    Ischemic cardiomyopathy    a. EF 25-30% by LV gram on cath 11/04/15. Appeared out of proportion to infarct although infarct was large. EF improved by Echo and now 40-45%.   Myocardial infarction ([place])    NEPHROLITHIASIS, HX OF 11/27/2006         Peripheral neuropathy    Psoriasis    Tenosynovitis     Past Surgical History:  Procedure Laterality Date   [place]   minor disk  surgery: instrumentation placed and removed in a second procedure   CARDIAC CATHETERIZATION N/A 11/04/2015   Procedure: Left Heart Cath and Coronary Angiography;  Surgeon: Peter M Martinique, MD;  Location: [facility];  Service: Cardiovascular;  Laterality: N/A;   CARDIAC CATHETERIZATION N/A 11/04/2015   Procedure: Coronary Stent Intervention;  Surgeon: Peter M Martinique, MD;  Location: [facility];  Service: Cardiovascular;  Laterality: N/A;   CARDIAC CATHETERIZATION N/A 11/06/2015   Procedure: Coronary Stent Intervention;  Surgeon: Leonie Man, MD;  Location: [facility];  Service: Cardiovascular;  Laterality: N/A;   CATARACT EXTRACTION, BILATERAL  2013   CHOLECYSTECTOMY  1986   COLON SURGERY  1980's   pt. had ileus, had colon surgery, requiring colostomy & then reversal & then dehisence of that wound & return to OR for repair & cholecystectomy     COLONOSCOPY     COLOSTOMY  1985   After colectomy for diverticulitis, pt. remarks during this surgery they "gave me the paddles two times  because of bleeding", pt. states it was unrelated to any anesthesia complication   EYE SURGERY     /w IOL   KNEE  ARTHROSCOPY Left 2003   KNEE SURGERY  1956   Left 1956, Right w/cartilage removed later   PARATHYROIDECTOMY N/A 05/12/2014   Procedure: PARATHYROIDECTOMY;  Surgeon: Armandina Gemma, MD;  Location: [facility];  Service: General;  Laterality: N/A;   [place]   WRIST SURGERY Left    Remote complicated w/infection    There were no vitals filed for this visit.   Subjective Assessment - 01/28/21 2052     Subjective Pt with no new complaints. He has to go out of town next week.    Currently in Pain? Yes    Pain Score 4     Pain Location Back    Pain Orientation Left    Pain Descriptors / Indicators Aching    Pain Type Chronic pain    Pain Onset More than a month ago    Pain Frequency Intermittent    Pain Location Knee    Pain Orientation Left     Pain Descriptors / Indicators Aching    Pain Type Chronic pain    Pain Onset More than a month ago    Pain Frequency Intermittent                               [facility] Adult PT Treatment/Exercise - 01/28/21 2045       Ambulation/Gait   Gait Comments 35 ft x 8 with cuing for improving step height      Exercises   Exercises Lumbar      Lumbar Exercises: Stretches   Active Hamstring Stretch 3 reps;30 seconds    Single Knee to Chest Stretch 3 reps;30 seconds    Lower Trunk Rotation 5 reps;10 seconds      Lumbar Exercises: Standing   Other Standing Lumbar Exercises Scap squeeze x 15; Marching x 20 ;    Other Standing Lumbar Exercises L/R and A/P weight shifts, fwd and bwd steppint with weight shifts x 15 eac bil;      Lumbar Exercises: Seated   Long Arc Quad on Chair Both;20 reps    LAQ on Chair Weights (lbs) 2    Sit to Stand 10 reps                     PT Education - 01/28/21 2053     Education Details Education on initial HEP  [id number]    Person(s) Educated Patient    Methods Explanation;Demonstration;Tactile cues;Verbal cues;Handout    Comprehension Verbalized understanding;Returned demonstration;Verbal cues required;Tactile cues required;Need further instruction              PT Short Term Goals - 01/28/21 0946       PT SHORT TERM GOAL #1   Title Pt to be independent with initial HEP    Time 3    Period Weeks    Status New    Target Date 01/25/21               PT Long Term Goals - 01/28/21 0947       PT LONG TERM GOAL #1   Title Pt to be independent with final HEP for LE strength and balance    Time 8    Period Weeks    Status New    Target Date 03/01/21      PT LONG TERM GOAL #2   Title Pt to demo improved strength of bil hips and  knees to at least 4+/5 to improve stability and gait/stairs    Time 8    Period Weeks    Status New    Target Date 03/01/21      PT LONG TERM GOAL #3   Title Pt to demo improved  score on 5 time sit to stand by at least 5 seconds to improve efficiency with transfers and mobility    Time 8    Period Weeks    Status New    Target Date 03/01/21      PT LONG TERM GOAL #4   Title Pt to demo improved score on BERG by at least 6 points, to improve safety with functional mobility    Time 8    Period Weeks    Status New    Target Date 03/01/21      PT LONG TERM GOAL #5   Title Pt to report ability for standing/walking for at least 20 min without back pain more than 3/10, to improve ability for community activity.    Time 8    Period Weeks    Status New    Target Date 03/01/21                   Plan - 01/28/21 2055     Clinical Impression Statement Pt educated on HEP today, for LE strength. Added to and reviewed existing HEP. Pt challenged with standing balance and will benefit from progression of strengthening and balance at next visits.    Personal Factors and Comorbidities Comorbidity 1    Comorbidities CVA, MI, CAD, Neuropathy, prostate CA, non hodgkins lymphoma    Examination-Activity Limitations Lift;Stand;Bend;Transfers;Stairs;Squat;Carry    Examination-Participation Restrictions Cleaning;Meal Prep;Community Activity;Shop    Stability/Clinical Decision Making Stable/Uncomplicated    Rehab Potential Good    PT Frequency 2x / week    PT Duration 8 weeks    PT Treatment/Interventions ADLs/Self Care Home Management;Cryotherapy;DME Instruction;Moist Heat;Gait training;Stair training;Functional mobility training;Therapeutic activities;Therapeutic exercise;Balance training;Patient/family education;Neuromuscular re-education;Manual techniques;Passive range of motion;Dry needling;Taping;Vasopneumatic Device;Spinal Manipulations;Joint Manipulations    Consulted and Agree with Plan of Care Patient             Patient will benefit from skilled therapeutic intervention in order to improve the following deficits and impairments:  Abnormal gait, Pain,  Decreased mobility, Decreased strength, Decreased range of motion, Decreased endurance, Decreased activity tolerance, Decreased balance, Difficulty walking  Visit Diagnosis: Muscle weakness (generalized)  Other abnormalities of gait and mobility  Difficulty walking  Chronic pain of left knee  Pain, lumbar region     Problem List Patient Active Problem List   Diagnosis Date Noted   Partial small bowel obstruction ([place]) 11/13/2020   OSA (obstructive sleep apnea) 05/08/2020   Pain of back and left lower extremity 06/08/2019   Left knee pain 06/06/2019   Onychomycosis 10/28/2017   Spondylosis 06/25/2017   Post herpetic neuralgia 06/20/2017   Spinal stenosis of lumbar region at multiple levels 04/30/2017   Drug reaction 02/05/2017   Senile purpura ([place]) 02/05/2017   Dyslipidemia 11/16/2015   History of ventricular tachycardia 11/08/2015   CAD S/P PCI    Ischemic cardiomyopathy    History of embolic stroke    Post-traumatic osteoarthritis of right wrist 04/25/2015   History of adenomatous polyp of colon 01/18/2015   Primary hyperparathyroidism ([place]) 12/28/2013   History of stomach cancer 12/01/2013   Fatigue 12/01/2013   Lower back pain 03/24/2013   Bradycardia 09/23/2012   B12 deficiency 09/17/2010   History  of prostate cancer 12/14/2008   PERIPHERAL NEUROPATHY 10/18/2007   PSORIASIS 10/15/2007   Essential hypertension 11/27/2006   DEGENERATIVE JOINT DISEASE 11/27/2006   NEPHROLITHIASIS, HX OF 11/27/2006    Lyndee Hensen, PT, DPT 9:08 PM  01/28/21    [facility] [place], [place], [address] Phone: [phone]   Fax:  [phone]  Name: ELLIE SPICKLER MRN: [id number] Date of Birth: [birth year]

## 2021-01-30 ENCOUNTER — Ambulatory Visit: Payer: Medicare Other | Admitting: Physical Therapy

## 2021-01-30 ENCOUNTER — Other Ambulatory Visit: Payer: Self-pay

## 2021-01-30 ENCOUNTER — Encounter: Payer: Self-pay | Admitting: Physical Therapy

## 2021-01-30 DIAGNOSIS — M25562 Pain in left knee: Secondary | ICD-10-CM

## 2021-01-30 DIAGNOSIS — R262 Difficulty in walking, not elsewhere classified: Secondary | ICD-10-CM

## 2021-01-30 DIAGNOSIS — R2689 Other abnormalities of gait and mobility: Secondary | ICD-10-CM

## 2021-01-30 DIAGNOSIS — G8929 Other chronic pain: Secondary | ICD-10-CM

## 2021-01-30 DIAGNOSIS — M6281 Muscle weakness (generalized): Secondary | ICD-10-CM

## 2021-01-30 NOTE — Therapy (Signed)
[facility] [address] [place], [place], [address] Phone: [phone]   Fax:  [phone]  Physical Therapy Treatment  Patient Details  Name: Jose Ware MRN: [id number] Date of Birth: 1934-08-14 Referring Provider (PT): Garret Reddish   Encounter Date: 01/30/2021   PT End of Session - 01/30/21 1107     Visit Number 4    Number of Visits 16    Date for PT Re-Evaluation 03/01/21    Authorization Type [employer] Medicare,  10 visits previously in 2022.    PT Start Time 1102    PT Stop Time 1143    PT Time Calculation (min) 41 min    Equipment Utilized During Treatment Gait belt    Activity Tolerance Patient tolerated treatment well    Behavior During Therapy WFL for tasks assessed/performed             Past Medical History:  Diagnosis Date   Adenocarcinoma of prostate ([place])    XRT & Radiation seed implantation in 2010   Adenomatous colon polyp    CAD (coronary artery disease)    a. 11/04/15:  Acute inferolateral STEMI: S/p emergent DES of a very large LCx with extensive thrombus. Significant residual disease in the proximal LAD and distal RCA. S/p staged PCI of LAD and RCA 8/29.   Cataract    CVA (cerebral infarction)    a. 03/5054: embolic CVA after heart cath    CVA (cerebral infarction)    a. 11/7946: embolic CVA after heart cath    DIVERTICULITIS, HX OF 11/27/2006        DJD (degenerative joint disease)    wrist - R    Hernia    History of blood transfusion 1985   with colon surgery   Hypertension    Ischemic cardiomyopathy    a. EF 25-30% by LV gram on cath 11/04/15. Appeared out of proportion to infarct although infarct was large. EF improved by Echo and now 40-45%.   Myocardial infarction ([place])    NEPHROLITHIASIS, HX OF 11/27/2006         Peripheral neuropathy    Psoriasis    Tenosynovitis     Past Surgical History:  Procedure Laterality Date   [place]   minor disk  surgery: instrumentation placed and removed in a second procedure   CARDIAC CATHETERIZATION N/A 11/04/2015   Procedure: Left Heart Cath and Coronary Angiography;  Surgeon: Peter M Martinique, MD;  Location: [facility];  Service: Cardiovascular;  Laterality: N/A;   CARDIAC CATHETERIZATION N/A 11/04/2015   Procedure: Coronary Stent Intervention;  Surgeon: Peter M Martinique, MD;  Location: [facility];  Service: Cardiovascular;  Laterality: N/A;   CARDIAC CATHETERIZATION N/A 11/06/2015   Procedure: Coronary Stent Intervention;  Surgeon: Leonie Man, MD;  Location: [facility];  Service: Cardiovascular;  Laterality: N/A;   CATARACT EXTRACTION, BILATERAL  2013   CHOLECYSTECTOMY  1986   COLON SURGERY  1980's   pt. had ileus, had colon surgery, requiring colostomy & then reversal & then dehisence of that wound & return to OR for repair & cholecystectomy     COLONOSCOPY     COLOSTOMY  1985   After colectomy for diverticulitis, pt. remarks during this surgery they "gave me the paddles two times  because of bleeding", pt. states it was unrelated to any anesthesia complication   EYE SURGERY     /w IOL   KNEE  ARTHROSCOPY Left 2003   KNEE SURGERY  1956   Left 1956, Right w/cartilage removed later   PARATHYROIDECTOMY N/A 05/12/2014   Procedure: PARATHYROIDECTOMY;  Surgeon: Armandina Gemma, MD;  Location: [facility];  Service: General;  Laterality: N/A;   [place]   WRIST SURGERY Left    Remote complicated w/infection    There were no vitals filed for this visit.   Subjective Assessment - 01/30/21 1107     Subjective Pt with no new complaints.    Patient Stated Goals improve balance    Currently in Pain? No/denies                               [facility] Adult PT Treatment/Exercise - 01/30/21 0001       Ambulation/Gait   Gait Comments 35 ft x 8 with cuing for improving step height and stride length on R;      Exercises    Exercises Lumbar      Lumbar Exercises: Stretches   Active Hamstring Stretch 3 reps;30 seconds    Active Hamstring Stretch Limitations seated      Lumbar Exercises: Aerobic   Recumbent Bike L2 x 5 min;      Lumbar Exercises: Standing   Row 20 reps    Theraband Level (Row) Level 3 (Green)    Other Standing Lumbar Exercises bwd walking 25 ft x 4;    Other Standing Lumbar Exercises A/P weight shifts, bwd and lateral  stepping with weight shifts x 15 eac bil; Tandem stance (modified) 20 sec x 3 bil; reaching outside of BOS x 15 ea bil;      Lumbar Exercises: Seated   Long Arc Quad on Chair Both;20 reps    LAQ on Chair Weights (lbs) 4    Sit to Stand 10 reps   no UE support                      PT Short Term Goals - 01/28/21 0946       PT SHORT TERM GOAL #1   Title Pt to be independent with initial HEP    Time 3    Period Weeks    Status New    Target Date 01/25/21               PT Long Term Goals - 01/28/21 0947       PT LONG TERM GOAL #1   Title Pt to be independent with final HEP for LE strength and balance    Time 8    Period Weeks    Status New    Target Date 03/01/21      PT LONG TERM GOAL #2   Title Pt to demo improved strength of bil hips and knees to at least 4+/5 to improve stability and gait/stairs    Time 8    Period Weeks    Status New    Target Date 03/01/21      PT LONG TERM GOAL #3   Title Pt to demo improved score on 5 time sit to stand by at least 5 seconds to improve efficiency with transfers and mobility    Time 8    Period Weeks    Status New    Target Date 03/01/21      PT LONG TERM GOAL #4   Title Pt to demo improved score on  BERG by at least 6 points, to improve safety with functional mobility    Time 8    Period Weeks    Status New    Target Date 03/01/21      PT LONG TERM GOAL #5   Title Pt to report ability for standing/walking for at least 20 min without back pain more than 3/10, to improve ability for  community activity.    Time 8    Period Weeks    Status New    Target Date 03/01/21                   Plan - 01/30/21 1156     Clinical Impression Statement Pt challenged with posterior weight shifts, stepping and tandem stance positions. He has tendency for lob posteriorly with bend/reaching. Pt to benefit from continued work on stability and strength for LEs.    Personal Factors and Comorbidities Comorbidity 1    Comorbidities CVA, MI, CAD, Neuropathy, prostate CA, non hodgkins lymphoma    Examination-Activity Limitations Lift;Stand;Bend;Transfers;Stairs;Squat;Carry    Examination-Participation Restrictions Cleaning;Meal Prep;Community Activity;Shop    Stability/Clinical Decision Making Stable/Uncomplicated    Rehab Potential Good    PT Frequency 2x / week    PT Duration 8 weeks    PT Treatment/Interventions ADLs/Self Care Home Management;Cryotherapy;DME Instruction;Moist Heat;Gait training;Stair training;Functional mobility training;Therapeutic activities;Therapeutic exercise;Balance training;Patient/family education;Neuromuscular re-education;Manual techniques;Passive range of motion;Dry needling;Taping;Vasopneumatic Device;Spinal Manipulations;Joint Manipulations    Consulted and Agree with Plan of Care Patient             Patient will benefit from skilled therapeutic intervention in order to improve the following deficits and impairments:  Abnormal gait, Pain, Decreased mobility, Decreased strength, Decreased range of motion, Decreased endurance, Decreased activity tolerance, Decreased balance, Difficulty walking  Visit Diagnosis: Muscle weakness (generalized)  Other abnormalities of gait and mobility  Difficulty walking  Chronic pain of left knee     Problem List Patient Active Problem List   Diagnosis Date Noted   Partial small bowel obstruction ([place]) 11/13/2020   OSA (obstructive sleep apnea) 05/08/2020   Pain of back and left lower extremity  06/08/2019   Left knee pain 06/06/2019   Onychomycosis 10/28/2017   Spondylosis 06/25/2017   Post herpetic neuralgia 06/20/2017   Spinal stenosis of lumbar region at multiple levels 04/30/2017   Drug reaction 02/05/2017   Senile purpura ([place]) 02/05/2017   Dyslipidemia 11/16/2015   History of ventricular tachycardia 11/08/2015   CAD S/P PCI    Ischemic cardiomyopathy    History of embolic stroke    Post-traumatic osteoarthritis of right wrist 04/25/2015   History of adenomatous polyp of colon 01/18/2015   Primary hyperparathyroidism ([place]) 12/28/2013   History of stomach cancer 12/01/2013   Fatigue 12/01/2013   Lower back pain 03/24/2013   Bradycardia 09/23/2012   B12 deficiency 09/17/2010   History of prostate cancer 12/14/2008   PERIPHERAL NEUROPATHY 10/18/2007   PSORIASIS 10/15/2007   Essential hypertension 11/27/2006   DEGENERATIVE JOINT DISEASE 11/27/2006   NEPHROLITHIASIS, HX OF 11/27/2006    Lyndee Hensen, PT, DPT 11:58 AM  01/30/21    [facility] [address] [place], [place], [address] Phone: [phone]   Fax:  [phone]  Name: Jose Ware MRN: [id number] Date of Birth: Aug 07, 1934

## 2021-02-04 ENCOUNTER — Encounter: Payer: Self-pay | Admitting: Physical Therapy

## 2021-02-04 ENCOUNTER — Other Ambulatory Visit: Payer: Self-pay

## 2021-02-04 ENCOUNTER — Ambulatory Visit: Payer: Medicare Other | Admitting: Physical Therapy

## 2021-02-04 DIAGNOSIS — M545 Low back pain, unspecified: Secondary | ICD-10-CM | POA: Diagnosis not present

## 2021-02-04 DIAGNOSIS — R2689 Other abnormalities of gait and mobility: Secondary | ICD-10-CM | POA: Diagnosis not present

## 2021-02-04 DIAGNOSIS — M25562 Pain in left knee: Secondary | ICD-10-CM

## 2021-02-04 DIAGNOSIS — G8929 Other chronic pain: Secondary | ICD-10-CM | POA: Diagnosis not present

## 2021-02-04 DIAGNOSIS — M6281 Muscle weakness (generalized): Secondary | ICD-10-CM | POA: Diagnosis not present

## 2021-02-04 NOTE — Therapy (Signed)
[facility] [address] [place], [place], [address] Phone: [phone]   Fax:  [phone]  Physical Therapy Treatment  Patient Details  Name: Jose Ware MRN: [id number] Date of Birth: Feb 26, 1935 Referring Provider (PT): Garret Reddish   Encounter Date: 02/04/2021   PT End of Session - 02/04/21 1149     Visit Number 5    Number of Visits 16    Date for PT Re-Evaluation 03/01/21    Authorization Type [employer] Medicare,  10 visits previously in 2022.    PT Start Time 1100    PT Stop Time 1144    PT Time Calculation (min) 44 min    Equipment Utilized During Treatment Gait belt    Activity Tolerance Patient tolerated treatment well    Behavior During Therapy WFL for tasks assessed/performed             Past Medical History:  Diagnosis Date   Adenocarcinoma of prostate ([place])    XRT & Radiation seed implantation in 2010   Adenomatous colon polyp    CAD (coronary artery disease)    a. 11/04/15:  Acute inferolateral STEMI: S/p emergent DES of a very large LCx with extensive thrombus. Significant residual disease in the proximal LAD and distal RCA. S/p staged PCI of LAD and RCA 8/29.   Cataract    CVA (cerebral infarction)    a. 03/624: embolic CVA after heart cath    CVA (cerebral infarction)    a. 11/4852: embolic CVA after heart cath    DIVERTICULITIS, HX OF 11/27/2006        DJD (degenerative joint disease)    wrist - R    Hernia    History of blood transfusion 1985   with colon surgery   Hypertension    Ischemic cardiomyopathy    a. EF 25-30% by LV gram on cath 11/04/15. Appeared out of proportion to infarct although infarct was large. EF improved by Echo and now 40-45%.   Myocardial infarction ([place])    NEPHROLITHIASIS, HX OF 11/27/2006         Peripheral neuropathy    Psoriasis    Tenosynovitis     Past Surgical History:  Procedure Laterality Date   [place]   minor disk  surgery: instrumentation placed and removed in a second procedure   CARDIAC CATHETERIZATION N/A 11/04/2015   Procedure: Left Heart Cath and Coronary Angiography;  Surgeon: Peter M Martinique, MD;  Location: [facility];  Service: Cardiovascular;  Laterality: N/A;   CARDIAC CATHETERIZATION N/A 11/04/2015   Procedure: Coronary Stent Intervention;  Surgeon: Peter M Martinique, MD;  Location: [facility];  Service: Cardiovascular;  Laterality: N/A;   CARDIAC CATHETERIZATION N/A 11/06/2015   Procedure: Coronary Stent Intervention;  Surgeon: Leonie Man, MD;  Location: [facility];  Service: Cardiovascular;  Laterality: N/A;   CATARACT EXTRACTION, BILATERAL  2013   CHOLECYSTECTOMY  1986   COLON SURGERY  1980's   pt. had ileus, had colon surgery, requiring colostomy & then reversal & then dehisence of that wound & return to OR for repair & cholecystectomy     COLONOSCOPY     COLOSTOMY  1985   After colectomy for diverticulitis, pt. remarks during this surgery they "gave me the paddles two times  because of bleeding", pt. states it was unrelated to any anesthesia complication   EYE SURGERY     /w IOL   KNEE  ARTHROSCOPY Left 2003   KNEE SURGERY  1956   Left 1956, Right w/cartilage removed later   PARATHYROIDECTOMY N/A 05/12/2014   Procedure: PARATHYROIDECTOMY;  Surgeon: Armandina Gemma, MD;  Location: [facility];  Service: General;  Laterality: N/A;   [place]   WRIST SURGERY Left    Remote complicated w/infection    There were no vitals filed for this visit.   Subjective Assessment - 02/04/21 1149     Subjective Pt states back was sore for a few days over weekend, better today.    Currently in Pain? No/denies                               [facility] Adult PT Treatment/Exercise - 02/04/21 0001       Ambulation/Gait   Gait Comments --      Exercises   Exercises Lumbar      Lumbar Exercises: Stretches   Active Hamstring  Stretch 3 reps;30 seconds    Active Hamstring Stretch Limitations seated      Lumbar Exercises: Aerobic   Recumbent Bike L2 x 5 min;      Lumbar Exercises: Standing   Row 20 reps    Theraband Level (Row) Level 4 (Blue)    Other Standing Lumbar Exercises --    Other Standing Lumbar Exercises A/P weight shifts, lateral  stepping with weight shifts x 15 eac bil; Tandem stance (modified) 20 sec x 3 bil; reaching outside of BOS x 15 ea bil; posterior perturbations at shoulder and hip x 10 bil; torso turns x 5 bil;      Lumbar Exercises: Seated   Long Arc Quad on Chair Both;20 reps    LAQ on Chair Weights (lbs) 4    Sit to Stand 10 reps   no UE support                    PT Education - 02/04/21 1149     Education Details reviewed HEP and balance safety    Person(s) Educated Patient    Methods Explanation;Demonstration;Tactile cues;Verbal cues;Handout    Comprehension Verbalized understanding;Returned demonstration;Verbal cues required;Tactile cues required;Need further instruction              PT Short Term Goals - 01/28/21 0946       PT SHORT TERM GOAL #1   Title Pt to be independent with initial HEP    Time 3    Period Weeks    Status New    Target Date 01/25/21               PT Long Term Goals - 01/28/21 0947       PT LONG TERM GOAL #1   Title Pt to be independent with final HEP for LE strength and balance    Time 8    Period Weeks    Status New    Target Date 03/01/21      PT LONG TERM GOAL #2   Title Pt to demo improved strength of bil hips and knees to at least 4+/5 to improve stability and gait/stairs    Time 8    Period Weeks    Status New    Target Date 03/01/21      PT LONG TERM GOAL #3   Title Pt to demo improved score on 5 time sit to stand by at least 5 seconds to  improve efficiency with transfers and mobility    Time 8    Period Weeks    Status New    Target Date 03/01/21      PT LONG TERM GOAL #4   Title Pt to demo improved  score on BERG by at least 6 points, to improve safety with functional mobility    Time 8    Period Weeks    Status New    Target Date 03/01/21      PT LONG TERM GOAL #5   Title Pt to report ability for standing/walking for at least 20 min without back pain more than 3/10, to improve ability for community activity.    Time 8    Period Weeks    Status New    Target Date 03/01/21                   Plan - 02/04/21 1151     Clinical Impression Statement Pt most challenged wtih posterior weight shifts and perturbations, with several lob posteriorly today. Pt standing in front of higher mat table, able to sit down if/when he looses balance. Pt to benefit from continued practice with this, as well as LE strength and gait.    Personal Factors and Comorbidities Comorbidity 1    Comorbidities CVA, MI, CAD, Neuropathy, prostate CA, non hodgkins lymphoma    Examination-Activity Limitations Lift;Stand;Bend;Transfers;Stairs;Squat;Carry    Examination-Participation Restrictions Cleaning;Meal Prep;Community Activity;Shop    Stability/Clinical Decision Making Stable/Uncomplicated    Rehab Potential Good    PT Frequency 2x / week    PT Duration 8 weeks    PT Treatment/Interventions ADLs/Self Care Home Management;Cryotherapy;DME Instruction;Moist Heat;Gait training;Stair training;Functional mobility training;Therapeutic activities;Therapeutic exercise;Balance training;Patient/family education;Neuromuscular re-education;Manual techniques;Passive range of motion;Dry needling;Taping;Vasopneumatic Device;Spinal Manipulations;Joint Manipulations    Consulted and Agree with Plan of Care Patient             Patient will benefit from skilled therapeutic intervention in order to improve the following deficits and impairments:  Abnormal gait, Pain, Decreased mobility, Decreased strength, Decreased range of motion, Decreased endurance, Decreased activity tolerance, Decreased balance, Difficulty  walking  Visit Diagnosis: Muscle weakness (generalized)  Other abnormalities of gait and mobility  Chronic pain of left knee  Pain, lumbar region     Problem List Patient Active Problem List   Diagnosis Date Noted   Partial small bowel obstruction ([place]) 11/13/2020   OSA (obstructive sleep apnea) 05/08/2020   Pain of back and left lower extremity 06/08/2019   Left knee pain 06/06/2019   Onychomycosis 10/28/2017   Spondylosis 06/25/2017   Post herpetic neuralgia 06/20/2017   Spinal stenosis of lumbar region at multiple levels 04/30/2017   Drug reaction 02/05/2017   Senile purpura ([place]) 02/05/2017   Dyslipidemia 11/16/2015   History of ventricular tachycardia 11/08/2015   CAD S/P PCI    Ischemic cardiomyopathy    History of embolic stroke    Post-traumatic osteoarthritis of right wrist 04/25/2015   History of adenomatous polyp of colon 01/18/2015   Primary hyperparathyroidism ([place]) 12/28/2013   History of stomach cancer 12/01/2013   Fatigue 12/01/2013   Lower back pain 03/24/2013   Bradycardia 09/23/2012   B12 deficiency 09/17/2010   History of prostate cancer 12/14/2008   PERIPHERAL NEUROPATHY 10/18/2007   PSORIASIS 10/15/2007   Essential hypertension 11/27/2006   DEGENERATIVE JOINT DISEASE 11/27/2006   NEPHROLITHIASIS, HX OF 11/27/2006   Lyndee Hensen, PT, DPT 11:53 AM  02/04/21    [facility] [phone]  [place], [place], [address] Phone: [phone]   Fax:  [phone]  Name: Jose Ware MRN: [id number] Date of Birth: [birth year]

## 2021-02-06 ENCOUNTER — Encounter: Payer: Medicare Other | Admitting: Physical Therapy

## 2021-02-08 ENCOUNTER — Encounter: Payer: Medicare Other | Admitting: Physical Therapy

## 2021-02-14 ENCOUNTER — Other Ambulatory Visit: Payer: Self-pay

## 2021-02-14 ENCOUNTER — Ambulatory Visit: Payer: Medicare Other | Admitting: Physical Therapy

## 2021-02-14 DIAGNOSIS — M6281 Muscle weakness (generalized): Secondary | ICD-10-CM | POA: Diagnosis not present

## 2021-02-14 DIAGNOSIS — M545 Low back pain, unspecified: Secondary | ICD-10-CM

## 2021-02-14 DIAGNOSIS — G8929 Other chronic pain: Secondary | ICD-10-CM | POA: Diagnosis not present

## 2021-02-14 DIAGNOSIS — R2689 Other abnormalities of gait and mobility: Secondary | ICD-10-CM | POA: Diagnosis not present

## 2021-02-14 DIAGNOSIS — M25562 Pain in left knee: Secondary | ICD-10-CM | POA: Diagnosis not present

## 2021-02-18 ENCOUNTER — Ambulatory Visit: Payer: Medicare Other | Admitting: Physical Therapy

## 2021-02-18 ENCOUNTER — Encounter: Payer: Self-pay | Admitting: Physical Therapy

## 2021-02-18 ENCOUNTER — Other Ambulatory Visit: Payer: Self-pay

## 2021-02-18 DIAGNOSIS — M6281 Muscle weakness (generalized): Secondary | ICD-10-CM

## 2021-02-18 DIAGNOSIS — M545 Low back pain, unspecified: Secondary | ICD-10-CM

## 2021-02-18 DIAGNOSIS — R2689 Other abnormalities of gait and mobility: Secondary | ICD-10-CM

## 2021-02-18 DIAGNOSIS — G8929 Other chronic pain: Secondary | ICD-10-CM | POA: Diagnosis not present

## 2021-02-18 DIAGNOSIS — M25562 Pain in left knee: Secondary | ICD-10-CM

## 2021-02-18 NOTE — Therapy (Signed)
[facility] [address] [place], [place], [address] Phone: [phone]   Fax:  [phone]  Physical Therapy Treatment  Patient Details  Name: Jose Ware MRN: [id number] Date of Birth: [birth year] Referring Provider (PT): Garret Reddish   Encounter Date: 02/18/2021   PT End of Session - 02/18/21 1051     Visit Number 7    Number of Visits 16    Date for PT Re-Evaluation 03/01/21    Authorization Type [employer] Medicare,  10 visits previously in 2022.    PT Start Time 1005    PT Stop Time 1046    PT Time Calculation (min) 41 min    Equipment Utilized During Treatment Gait belt    Activity Tolerance Patient tolerated treatment well    Behavior During Therapy WFL for tasks assessed/performed             Past Medical History:  Diagnosis Date   Adenocarcinoma of prostate ([place])    XRT & Radiation seed implantation in 2010   Adenomatous colon polyp    CAD (coronary artery disease)    a. 11/04/15:  Acute inferolateral STEMI: S/p emergent DES of a very large LCx with extensive thrombus. Significant residual disease in the proximal LAD and distal RCA. S/p staged PCI of LAD and RCA 8/29.   Cataract    CVA (cerebral infarction)    a. 08/107: embolic CVA after heart cath    CVA (cerebral infarction)    a. 05/2353: embolic CVA after heart cath    DIVERTICULITIS, HX OF 11/27/2006        DJD (degenerative joint disease)    wrist - R    Hernia    History of blood transfusion 1985   with colon surgery   Hypertension    Ischemic cardiomyopathy    a. EF 25-30% by LV gram on cath 11/04/15. Appeared out of proportion to infarct although infarct was large. EF improved by Echo and now 40-45%.   Myocardial infarction ([place])    NEPHROLITHIASIS, HX OF 11/27/2006         Peripheral neuropathy    Psoriasis    Tenosynovitis     Past Surgical History:  Procedure Laterality Date   [place]   minor disk  surgery: instrumentation placed and removed in a second procedure   CARDIAC CATHETERIZATION N/A 11/04/2015   Procedure: Left Heart Cath and Coronary Angiography;  Surgeon: Peter M Martinique, MD;  Location: [facility];  Service: Cardiovascular;  Laterality: N/A;   CARDIAC CATHETERIZATION N/A 11/04/2015   Procedure: Coronary Stent Intervention;  Surgeon: Peter M Martinique, MD;  Location: [facility];  Service: Cardiovascular;  Laterality: N/A;   CARDIAC CATHETERIZATION N/A 11/06/2015   Procedure: Coronary Stent Intervention;  Surgeon: Leonie Man, MD;  Location: [facility];  Service: Cardiovascular;  Laterality: N/A;   CATARACT EXTRACTION, BILATERAL  2013   CHOLECYSTECTOMY  1986   COLON SURGERY  1980's   pt. had ileus, had colon surgery, requiring colostomy & then reversal & then dehisence of that wound & return to OR for repair & cholecystectomy     COLONOSCOPY     COLOSTOMY  1985   After colectomy for diverticulitis, pt. remarks during this surgery they "gave me the paddles two times  because of bleeding", pt. states it was unrelated to any anesthesia complication   EYE SURGERY     /w IOL   KNEE  ARTHROSCOPY Left 2003   KNEE SURGERY  1956   Left 1956, Right w/cartilage removed later   PARATHYROIDECTOMY N/A 05/12/2014   Procedure: PARATHYROIDECTOMY;  Surgeon: Armandina Gemma, MD;  Location: [facility];  Service: General;  Laterality: N/A;   [place]   WRIST SURGERY Left    Remote complicated w/infection    There were no vitals filed for this visit.   Subjective Assessment - 02/18/21 1049     Subjective Pt states doing well this week.    Currently in Pain? Yes    Pain Score 4     Pain Location Back    Pain Orientation Right;Left    Pain Descriptors / Indicators Aching    Pain Type Chronic pain    Pain Onset More than a month ago    Pain Frequency Intermittent                [facility] PT Assessment - 02/18/21 0001        Transfers   Five time sit to stand comments  regular height chair: hands on knees:  19.0 sec, 17.75 sec.                           [facility] Adult PT Treatment/Exercise - 02/18/21 0001       Exercises   Exercises Lumbar      Lumbar Exercises: Stretches   Active Hamstring Stretch 3 reps;30 seconds    Active Hamstring Stretch Limitations seated      Lumbar Exercises: Aerobic   Recumbent Bike L2 x6  min;      Lumbar Exercises: Standing   Row 20 reps    Theraband Level (Row) Level 4 (Blue)    Other Standing Lumbar Exercises Marching x 20 minimal UE support; lateral torso turns x 10 bil; Standing cross/press with GTB diagonal for balance x 10 bil;  squat/pick up from floor x 5 bil; bwd walking 35 ft x 4 ; Fwd walking no AD 35 ft 8      Lumbar Exercises: Seated   Sit to Stand 10 reps   no UE support                    PT Education - 02/18/21 1051     Education Details reviewed HEP    Methods Explanation;Demonstration;Verbal cues    Comprehension Verbalized understanding;Returned demonstration;Verbal cues required              PT Short Term Goals - 02/18/21 0956       PT SHORT TERM GOAL #1   Title Pt to be independent with initial HEP    Time 3    Period Weeks    Status Achieved    Target Date 01/25/21               PT Long Term Goals - 01/28/21 0947       PT LONG TERM GOAL #1   Title Pt to be independent with final HEP for LE strength and balance    Time 8    Period Weeks    Status New    Target Date 03/01/21      PT LONG TERM GOAL #2   Title Pt to demo improved strength of bil hips and knees to at least 4+/5 to improve stability and gait/stairs    Time 8    Period Weeks    Status New  Target Date 03/01/21      PT LONG TERM GOAL #3   Title Pt to demo improved score on 5 time sit to stand by at least 5 seconds to improve efficiency with transfers and mobility    Time 8    Period Weeks    Status New    Target Date  03/01/21      PT LONG TERM GOAL #4   Title Pt to demo improved score on BERG by at least 6 points, to improve safety with functional mobility    Time 8    Period Weeks    Status New    Target Date 03/01/21      PT LONG TERM GOAL #5   Title Pt to report ability for standing/walking for at least 20 min without back pain more than 3/10, to improve ability for community activity.    Time 8    Period Weeks    Status New    Target Date 03/01/21                   Plan - 02/18/21 1052     Clinical Impression Statement Pt challenged with activities today, but doing better with less lob posteriorly. He has increased sway and abilityfor UE and trunk motions without lob. He also reports walking up to 15 min outdoors. Pt progressing well, will benefit from continued care for LE strength, stability and balance, to improve saftey and ability for functional activity. Plan to wok on outdoor surfaces in upcoming appoitments.    Personal Factors and Comorbidities Comorbidity 1    Comorbidities CVA, MI, CAD, Neuropathy, prostate CA, non hodgkins lymphoma    Examination-Activity Limitations Lift;Stand;Bend;Transfers;Stairs;Squat;Carry    Examination-Participation Restrictions Cleaning;Meal Prep;Community Activity;Shop    Stability/Clinical Decision Making Stable/Uncomplicated    Rehab Potential Good    PT Frequency 2x / week    PT Duration 8 weeks    PT Treatment/Interventions ADLs/Self Care Home Management;Cryotherapy;DME Instruction;Moist Heat;Gait training;Stair training;Functional mobility training;Therapeutic activities;Therapeutic exercise;Balance training;Patient/family education;Neuromuscular re-education;Manual techniques;Passive range of motion;Dry needling;Taping;Vasopneumatic Device;Spinal Manipulations;Joint Manipulations    Consulted and Agree with Plan of Care Patient             Patient will benefit from skilled therapeutic intervention in order to improve the following  deficits and impairments:  Abnormal gait, Pain, Decreased mobility, Decreased strength, Decreased range of motion, Decreased endurance, Decreased activity tolerance, Decreased balance, Difficulty walking  Visit Diagnosis: Muscle weakness (generalized)  Other abnormalities of gait and mobility  Pain, lumbar region  Chronic pain of left knee     Problem List Patient Active Problem List   Diagnosis Date Noted   Partial small bowel obstruction ([place]) 11/13/2020   OSA (obstructive sleep apnea) 05/08/2020   Pain of back and left lower extremity 06/08/2019   Left knee pain 06/06/2019   Onychomycosis 10/28/2017   Spondylosis 06/25/2017   Post herpetic neuralgia 06/20/2017   Spinal stenosis of lumbar region at multiple levels 04/30/2017   Drug reaction 02/05/2017   Senile purpura ([place]) 02/05/2017   Dyslipidemia 11/16/2015   History of ventricular tachycardia 11/08/2015   CAD S/P PCI    Ischemic cardiomyopathy    History of embolic stroke    Post-traumatic osteoarthritis of right wrist 04/25/2015   History of adenomatous polyp of colon 01/18/2015   Primary hyperparathyroidism ([place]) 12/28/2013   History of stomach cancer 12/01/2013   Fatigue 12/01/2013   Lower back pain 03/24/2013   Bradycardia 09/23/2012   B12 deficiency 09/17/2010  History of prostate cancer 12/14/2008   PERIPHERAL NEUROPATHY 10/18/2007   PSORIASIS 10/15/2007   Essential hypertension 11/27/2006   DEGENERATIVE JOINT DISEASE 11/27/2006   NEPHROLITHIASIS, HX OF 11/27/2006    Lyndee Hensen, PT, DPT 12:10 PM  02/18/21    [facility] [place], [place], [address] Phone: [phone]   Fax:  [phone]  Name: REEDY BIERNAT MRN: [id number] Date of Birth: Jan 28, 1935

## 2021-02-18 NOTE — Therapy (Signed)
[facility] [address] [place], [place], [address] Phone: [phone]   Fax:  [phone]  Physical Therapy Treatment  Patient Details  Name: Jose Ware MRN: [id number] Date of Birth: 1934/08/02 Referring Provider (PT): Garret Reddish   Encounter Date: 02/14/2021   PT End of Session - 02/18/21 0955     Visit Number 6    Number of Visits 16    Date for PT Re-Evaluation 03/01/21    Authorization Type [employer] Medicare,  10 visits previously in 2022.    PT Start Time 1432    PT Stop Time 1513    PT Time Calculation (min) 41 min    Equipment Utilized During Treatment Gait belt    Activity Tolerance Patient tolerated treatment well    Behavior During Therapy WFL for tasks assessed/performed             Past Medical History:  Diagnosis Date   Adenocarcinoma of prostate ([place])    XRT & Radiation seed implantation in 2010   Adenomatous colon polyp    CAD (coronary artery disease)    a. 11/04/15:  Acute inferolateral STEMI: S/p emergent DES of a very large LCx with extensive thrombus. Significant residual disease in the proximal LAD and distal RCA. S/p staged PCI of LAD and RCA 8/29.   Cataract    CVA (cerebral infarction)    a. 0/8676: embolic CVA after heart cath    CVA (cerebral infarction)    a. 03/9507: embolic CVA after heart cath    DIVERTICULITIS, HX OF 11/27/2006        DJD (degenerative joint disease)    wrist - R    Hernia    History of blood transfusion 1985   with colon surgery   Hypertension    Ischemic cardiomyopathy    a. EF 25-30% by LV gram on cath 11/04/15. Appeared out of proportion to infarct although infarct was large. EF improved by Echo and now 40-45%.   Myocardial infarction ([place])    NEPHROLITHIASIS, HX OF 11/27/2006         Peripheral neuropathy    Psoriasis    Tenosynovitis     Past Surgical History:  Procedure Laterality Date   [place]   minor disk surgery:  instrumentation placed and removed in a second procedure   CARDIAC CATHETERIZATION N/A 11/04/2015   Procedure: Left Heart Cath and Coronary Angiography;  Surgeon: Peter M Martinique, MD;  Location: [facility];  Service: Cardiovascular;  Laterality: N/A;   CARDIAC CATHETERIZATION N/A 11/04/2015   Procedure: Coronary Stent Intervention;  Surgeon: Peter M Martinique, MD;  Location: [facility];  Service: Cardiovascular;  Laterality: N/A;   CARDIAC CATHETERIZATION N/A 11/06/2015   Procedure: Coronary Stent Intervention;  Surgeon: Leonie Man, MD;  Location: [facility];  Service: Cardiovascular;  Laterality: N/A;   CATARACT EXTRACTION, BILATERAL  2013   CHOLECYSTECTOMY  1986   COLON SURGERY  1980's   pt. had ileus, had colon surgery, requiring colostomy & then reversal & then dehisence of that wound & return to OR for repair & cholecystectomy     COLONOSCOPY     COLOSTOMY  1985   After colectomy for diverticulitis, pt. remarks during this surgery they "gave me the paddles two times  because of bleeding", pt. states it was unrelated to any anesthesia complication   EYE SURGERY     /w IOL   KNEE  ARTHROSCOPY Left 2003   KNEE SURGERY  1956   Left 1956, Right w/cartilage removed later   PARATHYROIDECTOMY N/A 05/12/2014   Procedure: PARATHYROIDECTOMY;  Surgeon: Armandina Gemma, MD;  Location: [facility];  Service: General;  Laterality: N/A;   [place]   WRIST SURGERY Left    Remote complicated w/infection    There were no vitals filed for this visit.   Subjective Assessment - 02/18/21 0955     Subjective Pt states back has been more sore.    Currently in Pain? Yes    Pain Score 4     Pain Location Back    Pain Orientation Right;Left    Pain Descriptors / Indicators Aching    Pain Type Chronic pain    Pain Onset More than a month ago    Pain Frequency Intermittent                               [facility] Adult PT  Treatment/Exercise - 02/18/21 0001       Exercises   Exercises Lumbar      Lumbar Exercises: Stretches   Active Hamstring Stretch 3 reps;30 seconds    Active Hamstring Stretch Limitations seated    Single Knee to Chest Stretch 3 reps;30 seconds    Lower Trunk Rotation 5 reps;10 seconds      Lumbar Exercises: Aerobic   Recumbent Bike L2 x6  min;      Lumbar Exercises: Standing   Row 20 reps    Theraband Level (Row) Level 4 (Blue)    Other Standing Lumbar Exercises Marching x 20; A/P weight shifts, Tandem stance (modified) 20 sec x 3 bil; reaching outside of BOS x 15 ea bil; posterior perturbations at shoulder and hip x 10 bil; torso turns x 10 bil; bwd walking 20 ft x 6 ;      Lumbar Exercises: Seated   Long Arc Quad on Chair Both;20 reps    LAQ on Chair Weights (lbs) 4    Sit to Stand 10 reps   no UE support     Lumbar Exercises: Supine   Bridge 10 reps                     PT Education - 02/18/21 0955     Education Details reviewed HEP    Person(s) Educated Patient    Methods Explanation;Demonstration;Tactile cues;Verbal cues;Handout    Comprehension Verbalized understanding;Returned demonstration;Verbal cues required;Tactile cues required              PT Short Term Goals - 02/18/21 0956       PT SHORT TERM GOAL #1   Title Pt to be independent with initial HEP    Time 3    Period Weeks    Status Achieved    Target Date 01/25/21               PT Long Term Goals - 01/28/21 0947       PT LONG TERM GOAL #1   Title Pt to be independent with final HEP for LE strength and balance    Time 8    Period Weeks    Status New    Target Date 03/01/21      PT LONG TERM GOAL #2   Title Pt to demo improved strength of bil hips and knees to at least 4+/5 to improve stability  and gait/stairs    Time 8    Period Weeks    Status New    Target Date 03/01/21      PT LONG TERM GOAL #3   Title Pt to demo improved score on 5 time sit to stand by at least  5 seconds to improve efficiency with transfers and mobility    Time 8    Period Weeks    Status New    Target Date 03/01/21      PT LONG TERM GOAL #4   Title Pt to demo improved score on BERG by at least 6 points, to improve safety with functional mobility    Time 8    Period Weeks    Status New    Target Date 03/01/21      PT LONG TERM GOAL #5   Title Pt to report ability for standing/walking for at least 20 min without back pain more than 3/10, to improve ability for community activity.    Time 8    Period Weeks    Status New    Target Date 03/01/21                   Plan - 02/18/21 0956     Clinical Impression Statement Pt with improvements in ability for balance exercises today, with less lob. Improved ability for perturbations, torso turns, and reaching without LOB. Still has some difficulty with bend/squat motion, with tendency for posterior lob. Reviewed HEP. Pt to benefit from continued care, will continue to focus on LE strength and balance/stability.    Personal Factors and Comorbidities Comorbidity 1    Comorbidities CVA, MI, CAD, Neuropathy, prostate CA, non hodgkins lymphoma    Examination-Activity Limitations Lift;Stand;Bend;Transfers;Stairs;Squat;Carry    Examination-Participation Restrictions Cleaning;Meal Prep;Community Activity;Shop    Stability/Clinical Decision Making Stable/Uncomplicated    Rehab Potential Good    PT Frequency 2x / week    PT Duration 8 weeks    PT Treatment/Interventions ADLs/Self Care Home Management;Cryotherapy;DME Instruction;Moist Heat;Gait training;Stair training;Functional mobility training;Therapeutic activities;Therapeutic exercise;Balance training;Patient/family education;Neuromuscular re-education;Manual techniques;Passive range of motion;Dry needling;Taping;Vasopneumatic Device;Spinal Manipulations;Joint Manipulations    Consulted and Agree with Plan of Care Patient             Patient will benefit from skilled  therapeutic intervention in order to improve the following deficits and impairments:  Abnormal gait, Pain, Decreased mobility, Decreased strength, Decreased range of motion, Decreased endurance, Decreased activity tolerance, Decreased balance, Difficulty walking  Visit Diagnosis: Muscle weakness (generalized)  Other abnormalities of gait and mobility  Chronic pain of left knee  Pain, lumbar region     Problem List Patient Active Problem List   Diagnosis Date Noted   Partial small bowel obstruction ([place]) 11/13/2020   OSA (obstructive sleep apnea) 05/08/2020   Pain of back and left lower extremity 06/08/2019   Left knee pain 06/06/2019   Onychomycosis 10/28/2017   Spondylosis 06/25/2017   Post herpetic neuralgia 06/20/2017   Spinal stenosis of lumbar region at multiple levels 04/30/2017   Drug reaction 02/05/2017   Senile purpura ([place]) 02/05/2017   Dyslipidemia 11/16/2015   History of ventricular tachycardia 11/08/2015   CAD S/P PCI    Ischemic cardiomyopathy    History of embolic stroke    Post-traumatic osteoarthritis of right wrist 04/25/2015   History of adenomatous polyp of colon 01/18/2015   Primary hyperparathyroidism ([place]) 12/28/2013   History of stomach cancer 12/01/2013   Fatigue 12/01/2013   Lower back pain 03/24/2013   Bradycardia  09/23/2012   B12 deficiency 09/17/2010   History of prostate cancer 12/14/2008   PERIPHERAL NEUROPATHY 10/18/2007   PSORIASIS 10/15/2007   Essential hypertension 11/27/2006   DEGENERATIVE JOINT DISEASE 11/27/2006   NEPHROLITHIASIS, HX OF 11/27/2006    Lyndee Hensen, PT, DPT 10:01 AM  02/18/21   [facility] [address] [place], [place], [address] Phone: [phone]   Fax:  [phone]  Name: Jose Ware MRN: [id number] Date of Birth: 1934/06/26

## 2021-02-21 ENCOUNTER — Other Ambulatory Visit: Payer: Self-pay

## 2021-02-21 ENCOUNTER — Ambulatory Visit: Payer: Medicare Other | Admitting: Physical Therapy

## 2021-02-21 ENCOUNTER — Encounter: Payer: Self-pay | Admitting: Physical Therapy

## 2021-02-21 DIAGNOSIS — M545 Low back pain, unspecified: Secondary | ICD-10-CM | POA: Diagnosis not present

## 2021-02-21 DIAGNOSIS — G8929 Other chronic pain: Secondary | ICD-10-CM | POA: Diagnosis not present

## 2021-02-21 DIAGNOSIS — R2689 Other abnormalities of gait and mobility: Secondary | ICD-10-CM

## 2021-02-21 DIAGNOSIS — M6281 Muscle weakness (generalized): Secondary | ICD-10-CM

## 2021-02-21 DIAGNOSIS — M25562 Pain in left knee: Secondary | ICD-10-CM | POA: Diagnosis not present

## 2021-02-21 NOTE — Therapy (Addendum)
[facility] [address] [place], [place], [address] Phone: [phone]   Fax:  [phone]  Physical Therapy Treatment  Patient Details  Name: Jose Ware MRN: [id number] Date of Birth: 01/28/1935 Referring Provider (PT): Garret Reddish   Encounter Date: 02/21/2021   PT End of Session - 02/21/21 1506     Visit Number 8    Number of Visits 16    Date for PT Re-Evaluation 03/01/21    Authorization Type [employer] Medicare,  10 visits previously in 2022.    PT Start Time 1010    PT Stop Time 1055    PT Time Calculation (min) 45 min    Equipment Utilized During Treatment Gait belt    Activity Tolerance Patient tolerated treatment well    Behavior During Therapy WFL for tasks assessed/performed             Past Medical History:  Diagnosis Date   Adenocarcinoma of prostate ([place])    XRT & Radiation seed implantation in 2010   Adenomatous colon polyp    CAD (coronary artery disease)    a. 11/04/15:  Acute inferolateral STEMI: S/p emergent DES of a very large LCx with extensive thrombus. Significant residual disease in the proximal LAD and distal RCA. S/p staged PCI of LAD and RCA 8/29.   Cataract    CVA (cerebral infarction)    a. 06/1960: embolic CVA after heart cath    CVA (cerebral infarction)    a. 04/2977: embolic CVA after heart cath    DIVERTICULITIS, HX OF 11/27/2006        DJD (degenerative joint disease)    wrist - R    Hernia    History of blood transfusion 1985   with colon surgery   Hypertension    Ischemic cardiomyopathy    a. EF 25-30% by LV gram on cath 11/04/15. Appeared out of proportion to infarct although infarct was large. EF improved by Echo and now 40-45%.   Myocardial infarction ([place])    NEPHROLITHIASIS, HX OF 11/27/2006         Peripheral neuropathy    Psoriasis    Tenosynovitis     Past Surgical History:  Procedure Laterality Date   [place]   minor disk  surgery: instrumentation placed and removed in a second procedure   CARDIAC CATHETERIZATION N/A 11/04/2015   Procedure: Left Heart Cath and Coronary Angiography;  Surgeon: Peter M Martinique, MD;  Location: [facility];  Service: Cardiovascular;  Laterality: N/A;   CARDIAC CATHETERIZATION N/A 11/04/2015   Procedure: Coronary Stent Intervention;  Surgeon: Peter M Martinique, MD;  Location: [facility];  Service: Cardiovascular;  Laterality: N/A;   CARDIAC CATHETERIZATION N/A 11/06/2015   Procedure: Coronary Stent Intervention;  Surgeon: Leonie Man, MD;  Location: [facility];  Service: Cardiovascular;  Laterality: N/A;   CATARACT EXTRACTION, BILATERAL  2013   CHOLECYSTECTOMY  1986   COLON SURGERY  1980's   pt. had ileus, had colon surgery, requiring colostomy & then reversal & then dehisence of that wound & return to OR for repair & cholecystectomy     COLONOSCOPY     COLOSTOMY  1985   After colectomy for diverticulitis, pt. remarks during this surgery they "gave me the paddles two times  because of bleeding", pt. states it was unrelated to any anesthesia complication   EYE SURGERY     /w IOL   KNEE  ARTHROSCOPY Left 2003   KNEE SURGERY  1956   Left 1956, Right w/cartilage removed later   PARATHYROIDECTOMY N/A 05/12/2014   Procedure: PARATHYROIDECTOMY;  Surgeon: Armandina Gemma, MD;  Location: [facility];  Service: General;  Laterality: N/A;   [place]   WRIST SURGERY Left    Remote complicated w/infection    There were no vitals filed for this visit.   Subjective Assessment - 02/21/21 1010     Subjective pt with no new complaints.    Currently in Pain? Yes    Pain Score 4     Pain Location Back    Pain Orientation Right;Left    Pain Descriptors / Indicators Aching    Pain Type Chronic pain    Pain Onset More than a month ago    Pain Frequency Intermittent                               [facility] Adult PT  Treatment/Exercise - 02/21/21 0001       Exercises   Exercises Lumbar      Lumbar Exercises: Stretches   Active Hamstring Stretch 3 reps;30 seconds    Active Hamstring Stretch Limitations seated    Single Knee to Chest Stretch 2 reps;30 seconds      Lumbar Exercises: Aerobic   Recumbent Bike L2 x 8 min;      Lumbar Exercises: Standing   Heel Raises 15 reps    Heel Raises Limitations wiht post weight shift    Row 20 reps    Theraband Level (Row) Level 4 (Blue)    Other Standing Lumbar Exercises AirEx: staggered stance weight shifts (one foot on/one foot off) x 10 bil;  Step ups onto AirEx x 10 bil    Other Standing Lumbar Exercises Marching x 20 minimal UE support; bwd walking 35 ft x 4 ; Fwd walking no AD , quick, 35 ft 8;  Low row with band alternating for balance x 20;  Step up/over cones 5cones x 6, with CGA.      Lumbar Exercises: Seated   Long Arc Quad on Chair Both;20 reps    LAQ on Chair Weights (lbs) 5    Sit to Stand 10 reps   no UE support     Lumbar Exercises: Supine   Bridge 20 reps    Straight Leg Raise 10 reps    Straight Leg Raises Limitations bil                       PT Short Term Goals - 02/18/21 0956       PT SHORT TERM GOAL #1   Title Pt to be independent with initial HEP    Time 3    Period Weeks    Status Achieved    Target Date 01/25/21               PT Long Term Goals - 01/28/21 0947       PT LONG TERM GOAL #1   Title Pt to be independent with final HEP for LE strength and balance    Time 8    Period Weeks    Status New    Target Date 03/01/21      PT LONG TERM GOAL #2   Title Pt to demo improved strength of bil hips and knees to at least 4+/5 to improve stability  and gait/stairs    Time 8    Period Weeks    Status New    Target Date 03/01/21      PT LONG TERM GOAL #3   Title Pt to demo improved score on 5 time sit to stand by at least 5 seconds to improve efficiency with transfers and mobility    Time 8     Period Weeks    Status New    Target Date 03/01/21      PT LONG TERM GOAL #4   Title Pt to demo improved score on BERG by at least 6 points, to improve safety with functional mobility    Time 8    Period Weeks    Status New    Target Date 03/01/21      PT LONG TERM GOAL #5   Title Pt to report ability for standing/walking for at least 20 min without back pain more than 3/10, to improve ability for community activity.    Time 8    Period Weeks    Status New    Target Date 03/01/21                   Plan - 02/21/21 1507     Clinical Impression Statement Pt with improving abilityfor bwd walking, with good stability. He is more challenged with stepping over cones and step ups onto AirEx today, with tendency for posterior lob with step up. Plan to continue balance and strength progressions. Pt progressing well, likely d/c in next couple weeks.    Personal Factors and Comorbidities Comorbidity 1    Comorbidities CVA, MI, CAD, Neuropathy, prostate CA, non hodgkins lymphoma    Examination-Activity Limitations Lift;Stand;Bend;Transfers;Stairs;Squat;Carry    Examination-Participation Restrictions Cleaning;Meal Prep;Community Activity;Shop    Stability/Clinical Decision Making Stable/Uncomplicated    Rehab Potential Good    PT Frequency 2x / week    PT Duration 8 weeks    PT Treatment/Interventions ADLs/Self Care Home Management;Cryotherapy;DME Instruction;Moist Heat;Gait training;Stair training;Functional mobility training;Therapeutic activities;Therapeutic exercise;Balance training;Patient/family education;Neuromuscular re-education;Manual techniques;Passive range of motion;Dry needling;Taping;Vasopneumatic Device;Spinal Manipulations;Joint Manipulations    Consulted and Agree with Plan of Care Patient             Patient will benefit from skilled therapeutic intervention in order to improve the following deficits and impairments:  Abnormal gait, Pain, Decreased mobility,  Decreased strength, Decreased range of motion, Decreased endurance, Decreased activity tolerance, Decreased balance, Difficulty walking  Visit Diagnosis: Muscle weakness (generalized)  Other abnormalities of gait and mobility  Pain, lumbar region  Chronic pain of left knee     Problem List Patient Active Problem List   Diagnosis Date Noted   Partial small bowel obstruction ([place]) 11/13/2020   OSA (obstructive sleep apnea) 05/08/2020   Pain of back and left lower extremity 06/08/2019   Left knee pain 06/06/2019   Onychomycosis 10/28/2017   Spondylosis 06/25/2017   Post herpetic neuralgia 06/20/2017   Spinal stenosis of lumbar region at multiple levels 04/30/2017   Drug reaction 02/05/2017   Senile purpura ([place]) 02/05/2017   Dyslipidemia 11/16/2015   History of ventricular tachycardia 11/08/2015   CAD S/P PCI    Ischemic cardiomyopathy    History of embolic stroke    Post-traumatic osteoarthritis of right wrist 04/25/2015   History of adenomatous polyp of colon 01/18/2015   Primary hyperparathyroidism ([place]) 12/28/2013   History of stomach cancer 12/01/2013   Fatigue 12/01/2013   Lower back pain 03/24/2013   Bradycardia 09/23/2012   B12  deficiency 09/17/2010   History of prostate cancer 12/14/2008   PERIPHERAL NEUROPATHY 10/18/2007   PSORIASIS 10/15/2007   Essential hypertension 11/27/2006   DEGENERATIVE JOINT DISEASE 11/27/2006   NEPHROLITHIASIS, HX OF 11/27/2006   Lyndee Hensen, PT, DPT 3:09 PM  02/21/21    [facility] [place], [place], [address] Phone: [phone]   Fax:  [phone]  Name: ETHELBERT THAIN MRN: [id number]   Date of Birth: [birth year]  PHYSICAL THERAPY DISCHARGE SUMMARY  Visits from Start of Care: 8 Plan: Patient agrees to discharge.  Patient goals were partially  met. Patient is being discharged due to - not returning since last visit.    Lyndee Hensen, PT, DPT 3:37 PM   04/22/21

## 2021-02-26 DIAGNOSIS — G4733 Obstructive sleep apnea (adult) (pediatric): Secondary | ICD-10-CM | POA: Diagnosis not present

## 2021-03-09 ENCOUNTER — Other Ambulatory Visit: Payer: Self-pay | Admitting: Family Medicine

## 2021-03-09 DIAGNOSIS — G4733 Obstructive sleep apnea (adult) (pediatric): Secondary | ICD-10-CM | POA: Diagnosis not present

## 2021-03-29 DIAGNOSIS — G4733 Obstructive sleep apnea (adult) (pediatric): Secondary | ICD-10-CM | POA: Diagnosis not present

## 2021-04-01 DIAGNOSIS — H353112 Nonexudative age-related macular degeneration, right eye, intermediate dry stage: Secondary | ICD-10-CM | POA: Diagnosis not present

## 2021-04-01 DIAGNOSIS — H353122 Nonexudative age-related macular degeneration, left eye, intermediate dry stage: Secondary | ICD-10-CM | POA: Diagnosis not present

## 2021-04-01 DIAGNOSIS — H35372 Puckering of macula, left eye: Secondary | ICD-10-CM | POA: Diagnosis not present

## 2021-04-11 ENCOUNTER — Inpatient Hospital Stay: Payer: Medicare Other | Admitting: Hematology and Oncology

## 2021-04-11 ENCOUNTER — Other Ambulatory Visit: Payer: Self-pay

## 2021-04-11 ENCOUNTER — Inpatient Hospital Stay: Payer: Medicare Other | Attending: Hematology and Oncology

## 2021-04-11 VITALS — BP 117/70 | HR 61 | Temp 97.7°F | Resp 19 | Ht 73.0 in | Wt 201.9 lb

## 2021-04-11 DIAGNOSIS — Z87891 Personal history of nicotine dependence: Secondary | ICD-10-CM | POA: Diagnosis not present

## 2021-04-11 DIAGNOSIS — R590 Localized enlarged lymph nodes: Secondary | ICD-10-CM

## 2021-04-11 DIAGNOSIS — I10 Essential (primary) hypertension: Secondary | ICD-10-CM | POA: Insufficient documentation

## 2021-04-11 DIAGNOSIS — C8290 Follicular lymphoma, unspecified, unspecified site: Secondary | ICD-10-CM | POA: Insufficient documentation

## 2021-04-11 DIAGNOSIS — Z803 Family history of malignant neoplasm of breast: Secondary | ICD-10-CM | POA: Diagnosis not present

## 2021-04-11 DIAGNOSIS — C8293 Follicular lymphoma, unspecified, intra-abdominal lymph nodes: Secondary | ICD-10-CM | POA: Diagnosis not present

## 2021-04-11 DIAGNOSIS — C8203 Follicular lymphoma grade I, intra-abdominal lymph nodes: Secondary | ICD-10-CM

## 2021-04-11 DIAGNOSIS — Z807 Family history of other malignant neoplasms of lymphoid, hematopoietic and related tissues: Secondary | ICD-10-CM | POA: Diagnosis not present

## 2021-04-11 DIAGNOSIS — C8210 Follicular lymphoma grade II, unspecified site: Secondary | ICD-10-CM | POA: Diagnosis not present

## 2021-04-11 LAB — COMPREHENSIVE METABOLIC PANEL
ALT: 7 U/L (ref 0–44)
AST: 11 U/L — ABNORMAL LOW (ref 15–41)
Albumin: 4 g/dL (ref 3.5–5.0)
Alkaline Phosphatase: 72 U/L (ref 38–126)
Anion gap: 7 (ref 5–15)
BUN: 28 mg/dL — ABNORMAL HIGH (ref 8–23)
CO2: 31 mmol/L (ref 22–32)
Calcium: 9.4 mg/dL (ref 8.9–10.3)
Chloride: 102 mmol/L (ref 98–111)
Creatinine, Ser: 1.19 mg/dL (ref 0.61–1.24)
GFR, Estimated: >60 mL/min (ref >60)
Glucose, Bld: 73 mg/dL (ref 70–99)
Potassium: 4.2 mmol/L (ref 3.5–5.1)
Sodium: 140 mmol/L (ref 135–145)
Total Bilirubin: 1.1 mg/dL (ref 0.3–1.2)
Total Protein: 7 g/dL (ref 6.5–8.1)

## 2021-04-11 LAB — CBC WITH DIFFERENTIAL/PLATELET
Abs Immature Granulocytes: 0.02 10*3/uL (ref 0.00–0.07)
Basophils Absolute: 0 10*3/uL (ref 0.0–0.1)
Basophils Relative: 1 %
Eosinophils Absolute: 0.2 10*3/uL (ref 0.0–0.5)
Eosinophils Relative: 2 %
HCT: 39.6 % (ref 39.0–52.0)
Hemoglobin: 12.9 g/dL — ABNORMAL LOW (ref 13.0–17.0)
Immature Granulocytes: 0 %
Lymphocytes Relative: 17 %
Lymphs Abs: 1.4 10*3/uL (ref 0.7–4.0)
MCH: 32.1 pg (ref 26.0–34.0)
MCHC: 32.6 g/dL (ref 30.0–36.0)
MCV: 98.5 fL (ref 80.0–100.0)
Monocytes Absolute: 1.1 10*3/uL — ABNORMAL HIGH (ref 0.1–1.0)
Monocytes Relative: 13 %
Neutro Abs: 5.6 10*3/uL (ref 1.7–7.7)
Neutrophils Relative %: 67 %
Platelets: 218 10*3/uL (ref 150–400)
RBC: 4.02 MIL/uL — ABNORMAL LOW (ref 4.22–5.81)
RDW: 13.4 % (ref 11.5–15.5)
WBC: 8.3 10*3/uL (ref 4.0–10.5)
nRBC: 0 % (ref 0.0–0.2)

## 2021-04-11 NOTE — Progress Notes (Signed)
[facility] [phone]   [phone]  PROGRESS NOTE  Patient Care Team: [name], MD as PCP - General (Family Medicine) Martinique, Peter M, MD as PCP - Cardiology (Cardiology) Lowella Bandy, MD (Inactive) (Urology) Ladene Artist, MD (Gastroenterology) Martinique, Peter M, MD as Consulting Physician (Cardiology) Keene Breath., MD as Consulting Physician (Ophthalmology) Madelin Rear, [facility] as Pharmacist (Pharmacist) Festus Aloe, MD as Consulting Physician (Urology)  Hematological/Oncological History # Follicular Lymphoma Stage I-II 12/04/2020: CT C/A/P shows periaortic adenopathy in the upper abdomen. No evidence of disease in the chest 12/17/2020: biopsy of lymph node shows result consistent with follicular lymphoma. 01/09/2021: transition care to Dr. Lorenso Courier   Interval History:  Jose Ware 87 y.o. male with medical history significant for newly diagnosed early stage follicular lymphoma who presents for a follow up visit. The patient's last visit was on 01/09/2021 . In the interim since the last visit he has had no changes in his health.   On exam today Mr. Beltre is accompanied by his wife.  He reports reports he has "been doing good" in the interim since her last visit.  He reports that he has not noticed any bumps or lumps on his neck, under his arms, or another lymph node sites.  He notes his appetite has been good.  He has been having some difficulty with loose stools which is a perpetual issue since 1985 since his partial bowel resection.  He is currently following with Dr. Fuller Plan and GI for this.  He notes that recently his been having more issues with incontinence and urgency.  He notes that he does not sleep for long bouts of time but he is getting good rest.  He notes that he otherwise is not having any fevers, chills, sweats, nausea, vomiting or diarrhea.  Full 10 point ROS is listed below.  MEDICAL HISTORY:  Past Medical History:   Diagnosis Date   Adenocarcinoma of prostate [facility])    XRT & Radiation seed implantation in 2010   Adenomatous colon polyp    CAD (coronary artery disease)    a. 11/04/15:  Acute inferolateral STEMI: S/p emergent DES of a very large LCx with extensive thrombus. Significant residual disease in the proximal LAD and distal RCA. S/p staged PCI of LAD and RCA 8/29.   Cataract    CVA (cerebral infarction)    a. 08/3783: embolic CVA after heart cath    CVA (cerebral infarction)    a. 10/8500: embolic CVA after heart cath    DIVERTICULITIS, HX OF 11/27/2006        DJD (degenerative joint disease)    wrist - R    Hernia    History of blood transfusion 1985   with colon surgery   Hypertension    Ischemic cardiomyopathy    a. EF 25-30% by LV gram on cath 11/04/15. Appeared out of proportion to infarct although infarct was large. EF improved by Echo and now 40-45%.   Myocardial infarction ([place])    NEPHROLITHIASIS, HX OF 11/27/2006         Peripheral neuropathy    Psoriasis    Tenosynovitis     SURGICAL HISTORY: Past Surgical History:  Procedure Laterality Date   [place]   minor disk surgery: instrumentation placed and removed in a second procedure   CARDIAC CATHETERIZATION N/A 11/04/2015   Procedure: Left Heart Cath and Coronary Angiography;  Surgeon: Peter M Martinique, MD;  Location:  [facility];  Service: Cardiovascular;  Laterality: N/A;   CARDIAC CATHETERIZATION N/A 11/04/2015   Procedure: Coronary Stent Intervention;  Surgeon: Peter M Martinique, MD;  Location: [facility];  Service: Cardiovascular;  Laterality: N/A;   CARDIAC CATHETERIZATION N/A 11/06/2015   Procedure: Coronary Stent Intervention;  Surgeon: Leonie Man, MD;  Location: [facility];  Service: Cardiovascular;  Laterality: N/A;   CATARACT EXTRACTION, BILATERAL  2013   CHOLECYSTECTOMY  1986   COLON SURGERY  1980's   pt. had ileus, had colon surgery, requiring colostomy & then  reversal & then dehisence of that wound & return to OR for repair & cholecystectomy     COLONOSCOPY     COLOSTOMY  1985   After colectomy for diverticulitis, pt. remarks during this surgery they "gave me the paddles two times  because of bleeding", pt. states it was unrelated to any anesthesia complication   EYE SURGERY     /w IOL   KNEE ARTHROSCOPY Left 2003   [place], Right w/cartilage removed later   PARATHYROIDECTOMY N/A 05/12/2014   Procedure: PARATHYROIDECTOMY;  Surgeon: Armandina Gemma, MD;  Location: [facility];  Service: General;  Laterality: N/A;   REVERSAL OF COLOSTOMY  1987   TONSILLECTOMY  1946   WRIST SURGERY Left    Remote complicated w/infection    SOCIAL HISTORY: Social History   Socioeconomic History   Marital status: Married    Spouse name: Not on file   Number of children: 3   Years of education: Not on file   Highest education level: Not on file  Occupational History   Not on file  Tobacco Use   Smoking status: Former    Packs/day: 2.00    Years: 37.00    Pack years: 74.00    Types: Cigarettes    Quit date: 03/11/1983    Years since quitting: 38.1   Smokeless tobacco: Never   Tobacco comments:    started smoking in the service; ICU 85' part of his stomach; appendix; coma   Vaping Use   Vaping Use: Never used  Substance and Sexual Activity   Alcohol use: Yes    Alcohol/week: 0.0 standard drinks    Comment: RARE   Drug use: No   Sexual activity: Not on file  Other Topics Concern   Not on file  Social History Narrative   [place], [place].   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      Hobbies: fishing, busy volunteering            Social Determinants of Adult nurse Strain: Low Risk    Difficulty of Paying Living Expenses: Not hard at all  Food Insecurity: No Food Insecurity   Worried About Charity fundraiser in the Last Year: Never true   Arboriculturist in the Last Year: Never true  Transportation Needs: No Transportation Needs   Lack of Transportation (Medical): No   Lack of Transportation (Non-Medical): No  Physical Activity: Sufficiently Active   Days of Exercise per Week: 4 days   Minutes of Exercise per Session: 60  min  Stress: No Stress Concern Present   Feeling of Stress : Not at all  Social Connections: Moderately Integrated   Frequency of Communication with Friends and Family: Once a week   Frequency of Social Gatherings with Friends and Family: Once a week   Attends Religious Services: More than 4 times per year   Active Member of [employer] or Organizations: Yes   Attends Music therapist: 1 to 4 times per year   Marital Status: Married  Human resources officer Violence: Not At Risk   Fear of Current or Ex-Partner: No   Emotionally Abused: No   Physically Abused: No   Sexually Abused: No    FAMILY HISTORY: Family History  Problem Relation Age of Onset   Coronary artery disease Mother    Alcohol abuse Father    Cirrhosis Father    Heart failure Sister    Breast cancer Sister        W/involvement of right arm leading to amputation   Coronary artery disease Brother    Heart attack Brother    Non-Hodgkin's lymphoma Brother    Clotting disorder Brother    Non-Hodgkin's lymphoma Brother    Anemia Sister        related to treatment to lymphoma   Non-Hodgkin's lymphoma Sister    Prostate cancer Neg Hx    Colon cancer Neg Hx    Diabetes Neg Hx    Glaucoma Neg Hx    Rectal cancer Neg Hx    Esophageal cancer Neg Hx     ALLERGIES:  is allergic to bee venom, betadine [povidone iodine], and doxycycline.  MEDICATIONS:  Current Outpatient Medications  Medication Sig Dispense Refill   acetaminophen  (TYLENOL) 650 MG CR tablet Take 650 mg by mouth 2 (two) times daily as needed for pain (headache).     amLODipine (NORVASC) 5 MG tablet Take 1 tablet by mouth once daily 90 tablet 0   aspirin EC 81 MG tablet Take 1 tablet (81 mg total) by mouth daily. 90 tablet 1   atorvastatin (LIPITOR) 10 MG tablet Take 1 tablet (10 mg total) by mouth daily. 90 tablet 3   beta carotene w/minerals (OCUVITE) tablet Take 1 tablet by mouth every morning.     carvedilol (COREG) 3.125 MG tablet Take 1 tablet by mouth twice daily 180 tablet 3   Cyanocobalamin (VITAMIN B-12) 5000 MCG TBDP Take 5,000 mcg by mouth every morning.     losartan (COZAAR) 100 MG tablet Take 1 tablet by mouth once daily (Patient taking differently: Take 100 mg by mouth every morning.) 90 tablet 3   nitroGLYCERIN (NITROSTAT) 0.4 MG SL tablet Place 1 tablet (0.4 mg total) under the tongue every 5 (five) minutes x 3 doses as needed for chest pain. 25 tablet 2   spironolactone (ALDACTONE) 25 MG tablet Take 1 tablet by mouth once daily 90 tablet 1   No current facility-administered medications for this visit.    REVIEW OF SYSTEMS:   Constitutional: ( - ) fevers, ( - )  chills , ( - ) night sweats Eyes: ( - ) blurriness of vision, ( - ) double vision, ( - ) watery eyes Ears, nose, mouth, throat, and face: ( - ) mucositis, ( - ) sore throat Respiratory: ( - ) cough, ( - ) dyspnea, ( - ) wheezes Cardiovascular: ( - ) palpitation, ( - ) chest discomfort, ( - ) lower extremity swelling Gastrointestinal:  ( - ) nausea, ( - ) heartburn, ( - )  change in bowel habits Skin: ( - ) abnormal skin rashes Lymphatics: ( - ) new lymphadenopathy, ( - ) easy bruising Neurological: ( - ) numbness, ( - ) tingling, ( - ) new weaknesses Behavioral/Psych: ( - ) mood change, ( - ) new changes  All other systems were reviewed with the patient and are negative.  PHYSICAL EXAMINATION: ECOG PERFORMANCE STATUS: 0 - Asymptomatic  Vitals:   04/11/21 1041  BP: 117/70   Pulse: 61  Resp: 19  Temp: 97.7 F (36.5 C)  SpO2: 98%   Filed Weights   04/11/21 1041  Weight: 201 lb 14.4 oz (91.6 kg)    GENERAL: Well-appearing elderly Caucasian male, alert, no distress and comfortable SKIN: skin color, texture, turgor are normal, no rashes or significant lesions EYES: conjunctiva are pink and non-injected, sclera clear LUNGS: clear to auscultation and percussion with normal breathing effort HEART: regular rate & rhythm and no murmurs and no lower extremity edema Musculoskeletal: no cyanosis of digits and no clubbing  PSYCH: alert & oriented x 3, fluent speech NEURO: no focal motor/sensory deficits  LABORATORY DATA:  I have reviewed the data as listed CBC Latest Ref Rng & Units 04/11/2021 12/27/2020 12/17/2020  WBC 4.0 - 10.5 K/uL 8.3 7.4 8.6  Hemoglobin 13.0 - 17.0 g/dL 12.9(L) 11.9(L) 12.3(L)  Hematocrit 39.0 - 52.0 % 39.6 35.7(L) 37.3(L)  Platelets 150 - 400 K/uL 218 227.0 265    CMP Latest Ref Rng & Units 04/11/2021 12/27/2020 12/17/2020  Glucose 70 - 99 mg/dL 73 92 84  BUN 8 - 23 mg/dL 28(H) 24(H) 28(H)  Creatinine 0.61 - 1.24 mg/dL 1.19 1.07 1.06  Sodium 135 - 145 mmol/L 140 141 142  Potassium 3.5 - 5.1 mmol/L 4.2 3.9 4.1  Chloride 98 - 111 mmol/L 102 104 108  CO2 22 - 32 mmol/L 31 31 28   Calcium 8.9 - 10.3 mg/dL 9.4 8.6 9.3  Total Protein 6.5 - 8.1 g/dL 7.0 6.0 -  Total Bilirubin 0.3 - 1.2 mg/dL 1.1 1.2 -  Alkaline Phos 38 - 126 U/L 72 67 -  AST 15 - 41 U/L 11(L) 10 -  ALT 0 - 44 U/L 7 7 -   RADIOGRAPHIC STUDIES: No results found.  ASSESSMENT & PLAN Jose Ware 87 y.o. male with medical history significant for newly diagnosed early stage follicular lymphoma who presents for a follow up visit.   Today we discussed the diagnosis of follicular lymphoma.  Follicular lymphoma tends to be a more indolent lymphoma and therefore treatment is not immediately required.  We discussed the criteria the patient have to meet in order to begin  treatment.  This included the GELF criteria.  As the patient is currently low stage and does not meet any of these criteria I would recommend observation at this time.  The patient and his wife were in agreement with this plan moving forward.  FLIPI Score: Low Risk (Age >60). 10 year OS is 12%  # Follicular Lymphoma Stage I-II -- Findings are consistent with a low-grade lymphoma, most likely follicular lymphoma.  At this time disease appears quite limited in stage --No clear indication to start treatment at this time as patient does not meet any GELF Criteria -- Labs show white blood cell count 8.3, hemoglobin 12.9, MCV 98.5, and platelets of 218 --Plan for a return to clinic in 3 months time with a CT scan in order to evaluate for progression of disease.  Orders Placed This Encounter  Procedures  CT CHEST ABDOMEN PELVIS W CONTRAST    Standing Status:   Future    Standing Expiration Date:   04/13/2022    Order Specific Question:   Preferred imaging location?    Answer:   [facility]    Order Specific Question:   Is Oral Contrast requested for this exam?    Answer:   Yes, Per Radiology protocol   All questions were answered. The patient knows to call the clinic with any problems, questions or concerns.  A total of more than 30 minutes were spent on this encounter with face-to-face time and non-face-to-face time, including preparing to see the patient, ordering tests and/or medications, counseling the patient and coordination of care as outlined above.   Ledell Peoples, MD Department of Hematology/Oncology [place] [facility] Phone: [phone] Pager: [phone] Email: [email or web address]  04/13/2021 11:11 AM

## 2021-04-13 NOTE — Progress Notes (Signed)
04/15/2021 Jose Ware   [birth year]  [id number]  Primary Physician Yong Channel Brayton Mars, MD Primary Cardiologist: Dr Martinique  HPI:  Jose Ware is seen for follow up CAD. He has a history of HTN, remote tobacco abuse, psoriasis, prostate cancer s/p XRT and radiation seed therapy, adenomatous colon polyps and diverticulitis s/p previous abdominal surgeries.   He presented to [facility] on 11/04/15 as a inferolateral STEMI. Emergent cath revealed a very large LCx with extensive thrombus. He underwent PCI with DES. With reperfusion he had NSVT. The pt also had residual RCA and LAD disease and an EF of 25% at cath. Echo done 11/05/15 showed an EF of 40-45%. His Troponin peaked at 25. On 11/06/15 he was taken back to the lab for staged PCI to his LAD and RCA. This was successful but on 11/07/15 the pt related that he developed some word finding issues that started post PCI 8/29. MRI revealed scattered small acute infarcts in the bilateral anterior and posterior circulation.  CA dopplers revealed mild plaque (1-39%). The pt's symptoms improved.   He was admitted in September with partial SBO and cellulitis of upper arm. CT showed retroperitoneal adenopathy. PSA was normal. Repeat CT by oncology showed persistent adenopathy. Biopsy was positive for follicular lymphoma. This was felt to be low grade and is being followed closely by Dr Lorenso Courier.    On follow up today he reports he is doing well. He denies any chest pain or SOB. He tries to walk daily and goes to the gym 3x/week.  Tolerating medication well. He is now on CPAP.   Current Outpatient Medications  Medication Sig Dispense Refill   acetaminophen (TYLENOL) 650 MG CR tablet Take 650 mg by mouth 2 (two) times daily as needed for pain (headache).     amLODipine (NORVASC) 5 MG tablet Take 1 tablet by mouth once daily 90 tablet 0   aspirin EC 81 MG tablet Take 1 tablet (81 mg total) by mouth daily. 90 tablet 1   atorvastatin (LIPITOR) 10 MG tablet Take 1  tablet (10 mg total) by mouth daily. 90 tablet 3   beta carotene w/minerals (OCUVITE) tablet Take 1 tablet by mouth every morning.     carvedilol (COREG) 3.125 MG tablet Take 1 tablet by mouth twice daily 180 tablet 3   Cyanocobalamin (VITAMIN B-12) 5000 MCG TBDP Take 5,000 mcg by mouth every morning.     losartan (COZAAR) 100 MG tablet Take 1 tablet by mouth once daily (Patient taking differently: Take 100 mg by mouth every morning.) 90 tablet 3   nitroGLYCERIN (NITROSTAT) 0.4 MG SL tablet Place 1 tablet (0.4 mg total) under the tongue every 5 (five) minutes x 3 doses as needed for chest pain. 25 tablet 2   spironolactone (ALDACTONE) 25 MG tablet Take 1 tablet by mouth once daily 90 tablet 1   No current facility-administered medications for this visit.    Allergies  Allergen Reactions   Bee Venom Swelling    Facial swelling   Betadine [Povidone Iodine] Other (See Comments)    blistering   Doxycycline     Possible drug rash- back of right lower leg and onto left lower leg as well    Social History   Socioeconomic History   Marital status: Married    Spouse name: Not on file   Number of children: 3   Years of education: Not on file   Highest education level: Not on file  Occupational History   Not on  file  Tobacco Use   Smoking status: Former    Packs/day: 2.00    Years: 37.00    Pack years: 74.00    Types: Cigarettes    Quit date: 03/11/1983    Years since quitting: 38.1   Smokeless tobacco: Never   Tobacco comments:    started smoking in the service; ICU 85' part of his stomach; appendix; coma   Vaping Use   Vaping Use: Never used  Substance and Sexual Activity   Alcohol use: Yes    Alcohol/week: 0.0 standard drinks    Comment: RARE   Drug use: No   Sexual activity: Not on file  Other Topics Concern   Not on file  Social History Narrative   [place], [place].   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      Hobbies: fishing, busy volunteering            Social Determinants of Radio broadcast assistant Strain: Low Risk    Difficulty of Paying Living Expenses: Not hard at all  Food Insecurity: No Food Insecurity   Worried About Charity fundraiser in the Last Year: Never true   Arboriculturist in the Last Year: Never true  Transportation Needs: No Transportation Needs   Lack of Transportation (Medical): No   Lack of Transportation (Non-Medical): No  Physical Activity: Sufficiently Active   Days of Exercise per Week: 4 days   Minutes of Exercise per Session: 60 min  Stress: No Stress Concern Present   Feeling of Stress : Not at all  Social Connections: Moderately Integrated   Frequency of Communication with Friends and Family: Once a week   Frequency of Social Gatherings with Friends and Family: Once a week   Attends Religious Services: More than 4 times per year   Active Member of [employer] or Organizations: Yes   Attends Archivist Meetings: 1 to 4 times per year   Marital Status: Married  Human resources officer Violence: Not At Risk   Fear of Current or Ex-Partner: No   Emotionally Abused: No   Physically Abused: No   Sexually Abused: No     Review of Systems: As noted in HPI.  All other systems reviewed and are otherwise negative except as noted above.  Blood pressure (!) 144/75, pulse (!) 54, height 6\' 1"  (1.854 m), weight 206 lb 12.8 oz (93.8 kg), SpO2 96 %.  GENERAL:  Well appearing WM in NAD HEENT:  PERRL, EOMI, sclera are clear. Oropharynx is clear. NECK:  No jugular venous distention, carotid upstroke brisk and symmetric, no bruits, no thyromegaly or adenopathy LUNGS:  Clear to auscultation bilaterally CHEST:  Unremarkable HEART:  RRR,  PMI  not displaced or sustained,S1 and S2 within normal limits, no S3, no S4: no clicks, no rubs, no murmurs ABD:  Soft, nontender. BS +, no masses or bruits. No hepatomegaly, no splenomegaly EXT:  2 + pulses throughout, no edema, no cyanosis no clubbing SKIN:  Warm and dry.  No rashes NEURO:  Alert and oriented x 3. Cranial nerves II through XII intact. PSYCH:  Cognitively intact   Laboratory data:  Lab Results  Component Value Date   WBC 8.3 04/11/2021   HGB 12.9 (L) 04/11/2021   HCT 39.6 04/11/2021   PLT 218 04/11/2021   GLUCOSE 73 04/11/2021   CHOL 66 12/27/2020   TRIG 63.0 12/27/2020   HDL 31.00 (L) 12/27/2020   LDLDIRECT 33.0 06/25/2020   LDLCALC 22 12/27/2020   ALT 7 04/11/2021   AST 11 (L) 04/11/2021   NA 140 04/11/2021   K 4.2 04/11/2021   CL 102 04/11/2021   CREATININE 1.19 04/11/2021   BUN 28 (H) 04/11/2021   CO2 31 04/11/2021   TSH 3.09 06/20/2019   PSA 0.00 (L) 12/27/2020   INR 1.1 12/17/2020   HGBA1C 5.3 11/04/2015    ASSESSMENT AND PLAN:  1. CAD s/p inferolateral STEMI in August 2017 with thrombotic occlusion of the LCx. S/p DES. Subsequent staged PCI of the proximal LAD and RCA. He has no anginal symptoms. Continue ASA. Continue low dose Coreg.  2. Ischemic LV dysfunction. Asymptomatic. On Coreg. Losartan, aldactone. No overt evidence of  CHF. 3. 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 No residual symptoms.  4. Hypertension. Well controlled  5. Hyperlipidemia. On high dose statin. Excellent control with LDL 22 6. Sleep apnea. CPAP per pulmonary 7. Follicular lymphoma- per oncology   PLAN   Follow up in 6 months.  Ariyana Faw Martinique MD,FACC 04/15/2021 3:34 PM

## 2021-04-15 ENCOUNTER — Ambulatory Visit: Payer: Medicare Other | Admitting: Cardiology

## 2021-04-15 ENCOUNTER — Other Ambulatory Visit: Payer: Self-pay

## 2021-04-15 ENCOUNTER — Encounter: Payer: Self-pay | Admitting: Cardiology

## 2021-04-15 VITALS — BP 144/75 | HR 54 | Ht 73.0 in | Wt 206.8 lb

## 2021-04-15 DIAGNOSIS — I10 Essential (primary) hypertension: Secondary | ICD-10-CM | POA: Diagnosis not present

## 2021-04-15 DIAGNOSIS — I5022 Chronic systolic (congestive) heart failure: Secondary | ICD-10-CM | POA: Diagnosis not present

## 2021-04-15 DIAGNOSIS — I2510 Atherosclerotic heart disease of native coronary artery without angina pectoris: Secondary | ICD-10-CM | POA: Diagnosis not present

## 2021-04-29 DIAGNOSIS — G4733 Obstructive sleep apnea (adult) (pediatric): Secondary | ICD-10-CM | POA: Diagnosis not present

## 2021-04-30 ENCOUNTER — Telehealth: Payer: Self-pay | Admitting: Family Medicine

## 2021-04-30 NOTE — Chronic Care Management (AMB) (Signed)
°  Chronic Care Management   Note  04/30/2021 Name: Jose Ware MRN: [id number] DOB: 06/01/1934  Jose Ware is a 87 y.o. year old male who is a primary care patient of Yong Channel, Brayton Mars, MD. I reached out to Jose Ware by phone today in response to a referral sent by Mr. Cleatis Fandrich Hartshorne's PCP, [name], MD.   Mr. Burch was given information about Chronic Care Management services today including:  CCM service includes personalized support from designated clinical staff supervised by his physician, including individualized plan of care and coordination with other care providers 24/7 contact phone numbers for assistance for urgent and routine care needs. Service will only be billed when office clinical staff spend 20 minutes or more in a month to coordinate care. Only one practitioner may furnish and bill the service in a calendar month. The patient may stop CCM services at any time (effective at the end of the month) by phone call to the office staff.   Patient agreed to services and verbal consent obtained.   Follow up plan:   Tatjana Secretary/administrator

## 2021-05-07 ENCOUNTER — Ambulatory Visit: Payer: Medicare Other | Admitting: Gastroenterology

## 2021-05-07 ENCOUNTER — Encounter: Payer: Self-pay | Admitting: Gastroenterology

## 2021-05-07 VITALS — BP 122/70 | HR 60 | Ht 73.0 in | Wt 204.1 lb

## 2021-05-07 DIAGNOSIS — Z86010 Personal history of colonic polyps: Secondary | ICD-10-CM

## 2021-05-07 DIAGNOSIS — R195 Other fecal abnormalities: Secondary | ICD-10-CM | POA: Diagnosis not present

## 2021-05-07 MED ORDER — NA SULFATE-K SULFATE-MG SULF 17.5-3.13-1.6 GM/177ML PO SOLN: 1.0000 | Freq: Once | ORAL | 0 refills | Status: AC

## 2021-05-07 NOTE — Progress Notes (Signed)
History of Present Illness: This is an 87 year old male referred by [name], MD for the evaluation of a history of multiple adenomatous colon polyps.  He underwent colonoscopy October 2019: one 16 mm polyp was removed at or near the site of a prior piecemeal polypectomy of large tubulovillous adenoma removed in 2016 and 2017. Six other (<1 cm) adenomatous colon polyps were removed in 2019.  A 52-month interval colonoscopy was recommended however due to the COVID-19 pandemic he states he did not return for follow-up as recommended.  He has a long history of mildly loose stools since a partial bowel resection in 1985 and this bowel pattern has not changed.  No other gastrointestinal complaints.  He is followed by Dr. Narda Rutherford for a follicular lymphoma stage I-II which is currently under observation. Denies weight loss, abdominal pain, constipation, change in stool caliber, melena, hematochezia, nausea, vomiting, dysphagia, reflux symptoms, chest pain.     Allergies  Allergen Reactions   Bee Venom Swelling    Facial swelling   Betadine [Povidone Iodine] Other (See Comments)    blistering   Doxycycline     Possible drug rash- back of right lower leg and onto left lower leg as well   Outpatient Medications Prior to Visit  Medication Sig Dispense Refill   acetaminophen (TYLENOL) 650 MG CR tablet Take 650 mg by mouth 2 (two) times daily as needed for pain (headache).     amLODipine (NORVASC) 5 MG tablet Take 1 tablet by mouth once daily 90 tablet 0   aspirin EC 81 MG tablet Take 1 tablet (81 mg total) by mouth daily. 90 tablet 1   atorvastatin (LIPITOR) 10 MG tablet Take 1 tablet (10 mg total) by mouth daily. 90 tablet 3   carvedilol (COREG) 3.125 MG tablet Take 1 tablet by mouth twice daily 180 tablet 3   Cyanocobalamin (VITAMIN B-12) 5000 MCG TBDP Take 5,000 mcg by mouth every morning.     losartan (COZAAR) 100 MG tablet Take 1 tablet by mouth once daily (Patient taking differently:  Take 100 mg by mouth every morning.) 90 tablet 3   nitroGLYCERIN (NITROSTAT) 0.4 MG SL tablet Place 1 tablet (0.4 mg total) under the tongue every 5 (five) minutes x 3 doses as needed for chest pain. 25 tablet 2   spironolactone (ALDACTONE) 25 MG tablet Take 1 tablet by mouth once daily 90 tablet 1   beta carotene w/minerals (OCUVITE) tablet Take 1 tablet by mouth every morning.     No facility-administered medications prior to visit.   Past Medical History:  Diagnosis Date   Adenocarcinoma of prostate [facility])    XRT & Radiation seed implantation in 2010   Adenomatous colon polyp    CAD (coronary artery disease)    a. 11/04/15:  Acute inferolateral STEMI: S/p emergent DES of a very large LCx with extensive thrombus. Significant residual disease in the proximal LAD and distal RCA. S/p staged PCI of LAD and RCA 8/29.   Cataract    CVA (cerebral infarction)    a. 08/4330: embolic CVA after heart cath    CVA (cerebral infarction)    a. 11/5186: embolic CVA after heart cath    DIVERTICULITIS, HX OF 11/27/2006        DJD (degenerative joint disease)    wrist - R    Hernia    History of blood transfusion 1985   with colon surgery   Hypertension    Ischemic cardiomyopathy  a. EF 25-30% by LV gram on cath 11/04/15. Appeared out of proportion to infarct although infarct was large. EF improved by Echo and now 40-45%.   Myocardial infarction ([place])    NEPHROLITHIASIS, HX OF 11/27/2006         Peripheral neuropathy    Psoriasis    Tenosynovitis    Past Surgical History:  Procedure Laterality Date   [place]   minor disk surgery: instrumentation placed and removed in a second procedure   CARDIAC CATHETERIZATION N/A 11/04/2015   Procedure: Left Heart Cath and Coronary Angiography;  Surgeon: Peter M Martinique, MD;  Location: [place] [facility];  Service: Cardiovascular;  Laterality: N/A;   CARDIAC CATHETERIZATION N/A 11/04/2015   Procedure: Coronary Stent Intervention;   Surgeon: Peter M Martinique, MD;  Location: [place] [facility];  Service: Cardiovascular;  Laterality: N/A;   CARDIAC CATHETERIZATION N/A 11/06/2015   Procedure: Coronary Stent Intervention;  Surgeon: Leonie Man, MD;  Location: [place] [facility];  Service: Cardiovascular;  Laterality: N/A;   CATARACT EXTRACTION, BILATERAL  2013   CHOLECYSTECTOMY  1986   COLON SURGERY  1980's   pt. had ileus, had colon surgery, requiring colostomy & then reversal & then dehisence of that wound & return to OR for repair & cholecystectomy     COLONOSCOPY     COLOSTOMY  1985   After colectomy for diverticulitis, pt. remarks during this surgery they "gave me the paddles two times  because of bleeding", pt. states it was unrelated to any anesthesia complication   EYE SURGERY     /w IOL   KNEE ARTHROSCOPY Left 2003   [place], Right w/cartilage removed later   PARATHYROIDECTOMY N/A 05/12/2014   Procedure: PARATHYROIDECTOMY;  Surgeon: Armandina Gemma, MD;  Location: [place];  Service: General;  Laterality: N/A;   REVERSAL OF COLOSTOMY  1987   TONSILLECTOMY  1946   WRIST SURGERY Left    Remote complicated w/infection   Social History   Socioeconomic History   Marital status: Married    Spouse name: Not on file   Number of children: 3   Years of education: Not on file   Highest education level: Not on file  Occupational History   Not on file  Tobacco Use   Smoking status: Former    Packs/day: 2.00    Years: 37.00    Pack years: 74.00    Types: Cigarettes    Quit date: 03/11/1983    Years since quitting: 38.1   Smokeless tobacco: Never   Tobacco comments:    started smoking in the service; ICU 85' part of his stomach; appendix; coma   Vaping Use   Vaping Use: Never used  Substance and Sexual Activity   Alcohol use: Yes    Alcohol/week: 0.0 standard drinks    Comment: RARE   Drug use: No   Sexual activity: Not on file  Other Topics Concern   Not on file  Social History Narrative    [place], [place].   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      Hobbies: fishing,  busy volunteering            Social Determinants of Health   Financial Resource Strain: Low Risk    Difficulty of Paying Living Expenses: Not hard at all  Food Insecurity: No Food Insecurity   Worried About Charity fundraiser in the Last Year: Never true   Arboriculturist in the Last Year: Never true  Transportation Needs: No Transportation Needs   Lack of Transportation (Medical): No   Lack of Transportation (Non-Medical): No  Physical Activity: Sufficiently Active   Days of Exercise per Week: 4 days   Minutes of Exercise per Session: 60 min  Stress: No Stress Concern Present   Feeling of Stress : Not at all  Social Connections: Moderately Integrated   Frequency of Communication with Friends and Family: Once a week   Frequency of Social Gatherings with Friends and Family: Once a week   Attends Religious Services: More than 4 times per year   Active Member of [employer] or Organizations: Yes   Attends Archivist Meetings: 1 to 4 times per year   Marital Status: Married   Family History  Problem Relation Age of Onset   Coronary artery disease Mother    Alcohol abuse Father    Cirrhosis Father    Heart failure Sister    Breast cancer Sister        W/involvement of right arm leading to amputation   Coronary artery disease Brother    Heart attack Brother    Non-Hodgkin's lymphoma Brother    Clotting disorder Brother    Non-Hodgkin's lymphoma Brother    Anemia Sister        related to treatment to lymphoma   Non-Hodgkin's lymphoma Sister    Prostate cancer Neg Hx    Colon cancer Neg Hx    Diabetes Neg Hx     Glaucoma Neg Hx    Rectal cancer Neg Hx    Esophageal cancer Neg Hx        Review of Systems: Pertinent positive and negative review of systems were noted in the above HPI section. All other review of systems were otherwise negative.   Physical Exam: General: Well developed, well nourished, no acute distress Head: Normocephalic and atraumatic Eyes: Sclerae anicteric, EOMI Ears: Normal auditory acuity Mouth: Not examined, mask on during Covid-19 pandemic Neck: Supple, no masses or thyromegaly Lungs: Clear throughout to auscultation Heart: Regular rate and rhythm; no murmurs, rubs or bruits Abdomen: Soft, non tender and non distended. No masses, hepatosplenomegaly or hernias noted. Normal Bowel sounds Rectal: Deferred to colonoscopy Musculoskeletal: Symmetrical with no gross deformities  Skin: No lesions on visible extremities Pulses:  Normal pulses noted Extremities: No clubbing, cyanosis, edema or deformities noted Neurological: Alert oriented x 4, grossly nonfocal Cervical Nodes:  No significant cervical adenopathy Inguinal Nodes: No significant inguinal adenopathy Psychological:  Alert and cooperative. Normal mood and affect   Assessment and Recommendations:  Personal history of piecemeal removal of a large tubulovillous adenoma in 2016 and 2017. Multiple adenomatous colon polyps including a 16 mm polyp removed at or near the piecemeal site in 2019.  Schedule colonoscopy. The risks (including bleeding, perforation, infection, missed lesions, medication reactions and possible hospitalization or surgery if complications occur), benefits, and alternatives to colonoscopy with possible biopsy and possible polypectomy were discussed with the patient and they consent to proceed.   S/P colostomy, segmental colectomy, reversal of colostomy in 1985. Chronic loose stools, unchanged.    cc: Yong Channel,  Brayton Mars, MD [address] [place],  [place] [address]

## 2021-05-07 NOTE — Patient Instructions (Signed)
You have been scheduled for a colonoscopy. Please follow written instructions given to you at your visit today.  Please pick up your prep supplies at the pharmacy within the next 1-3 days. If you use inhalers (even only as needed), please bring them with you on the day of your procedure.  The [facility] providers would like to encourage you to use MYCHART to communicate with providers for non-urgent requests or questions.  Due to long hold times on the telephone, sending your provider a message by MYCHART may be a faster and more efficient way to get a response.  Please allow 48 business hours for a response.  Please remember that this is for non-urgent requests.   Due to recent changes in healthcare laws, you may see the results of your imaging and laboratory studies on MyChart before your provider has had a chance to review them.  We understand that in some cases there may be results that are confusing or concerning to you. Not all laboratory results come back in the same time frame and the provider may be waiting for multiple results in order to interpret others.  Please give us 48 hours in order for your provider to thoroughly review all the results before contacting the office for clarification of your results.   Thank you for choosing me and [facility].  Malcolm T. Stark, Jr., MD., FACG  

## 2021-05-17 ENCOUNTER — Ambulatory Visit (INDEPENDENT_AMBULATORY_CARE_PROVIDER_SITE_OTHER): Payer: Medicare Other

## 2021-05-17 VITALS — BP 130/68 | HR 63 | Temp 97.9°F | Wt 203.6 lb

## 2021-05-17 DIAGNOSIS — Z0000 Encounter for general adult medical examination without abnormal findings: Secondary | ICD-10-CM

## 2021-05-17 NOTE — Progress Notes (Addendum)
Subjective:   Jose Ware is a 87 y.o. male who presents for Medicare Annual/Subsequent preventive examination.  Review of Systems     Cardiac Risk Factors include: advanced age (>22mn, >>29women);male gender;dyslipidemia;hypertension     Objective:    Today's Vitals   05/17/21 1007 05/17/21 1027  BP: 130/80 130/68  Pulse: 63   Temp: 97.9 F (36.6 C)   SpO2: 94%   Weight: 203 lb 9.6 oz (92.4 kg)    Body mass index is 26.86 kg/m.  Advanced Directives 05/17/2021 01/28/2021 12/17/2020 11/11/2020 07/03/2020 05/10/2020 04/04/2019  Does Patient Have a Medical Advance Directive? Yes Yes Yes No Yes Yes Yes  Type of AParamed[place]of [place] will [place] - Living will;Healthcare Power of Attorney Living will Living will;Healthcare Power of Attorney  Does patient want to make changes to medical advance directive? - No - Patient declined No - Patient declined - - - No - Patient declined  Copy of [place] Chart? No - copy requested No - copy requested No - copy requested - No - copy requested - No - copy requested  Would patient like information on creating a medical advance directive? - - - No - Patient declined - - -    Current Medications (verified) Outpatient Encounter Medications as of 05/17/2021  Medication Sig   acetaminophen (TYLENOL) 650 MG CR tablet Take 650 mg by mouth 2 (two) times daily as needed for pain (headache).   amLODipine (NORVASC) 5 MG tablet Take 1 tablet by mouth once daily   aspirin EC 81 MG tablet Take 1 tablet (81 mg total) by mouth daily.   atorvastatin (LIPITOR) 10 MG tablet Take 1 tablet (10 mg total) by mouth daily.   carvedilol (COREG) 3.125 MG tablet Take 1 tablet by mouth twice daily   Cyanocobalamin (VITAMIN B-12) 5000 MCG TBDP Take 5,000 mcg by mouth every morning.   losartan (COZAAR) 100 MG tablet Take 1 tablet by mouth once  daily (Patient taking differently: Take 100 mg by mouth every morning.)   Multiple Vitamins-Minerals (OCUVITE ADULT 50+ PO)    nitroGLYCERIN (NITROSTAT) 0.4 MG SL tablet Place 1 tablet (0.4 mg total) under the tongue every 5 (five) minutes x 3 doses as needed for chest pain.   spironolactone (ALDACTONE) 25 MG tablet Take 1 tablet by mouth once daily   PFIZER COVID-19 VAC BIVALENT injection    No facility-administered encounter medications on file as of 05/17/2021.    Allergies (verified) Bee venom, Betadine [povidone iodine], and Doxycycline   History: Past Medical History:  Diagnosis Date   Adenocarcinoma of prostate ([place]    XRT & Radiation seed implantation in 2010   Adenomatous colon polyp    CAD (coronary artery disease)    a. 11/04/15:  Acute inferolateral STEMI: S/p emergent DES of a very large LCx with extensive thrombus. Significant residual disease in the proximal LAD and distal RCA. S/p staged PCI of LAD and RCA 8/29.   Cataract    CVA (cerebral infarction)    a. 89/6222 embolic CVA after heart cath    CVA (cerebral infarction)    a. 89/7989 embolic CVA after heart cath    DIVERTICULITIS, HX OF 11/27/2006        DJD (degenerative joint disease)    wrist - R    Hernia    History of blood transfusion 1985   with colon surgery   Hypertension  Ischemic cardiomyopathy    a. EF 25-30% by LV gram on cath 11/04/15. Appeared out of proportion to infarct although infarct was large. EF improved by Echo and now 40-45%.   Myocardial infarction ([place])    NEPHROLITHIASIS, HX OF 11/27/2006         Peripheral neuropathy    Psoriasis    Tenosynovitis    Past Surgical History:  Procedure Laterality Date   [place]   minor disk surgery: instrumentation placed and removed in a second procedure   CARDIAC CATHETERIZATION N/A 11/04/2015   Procedure: Left Heart Cath and Coronary Angiography;  Surgeon: Peter M Martinique, MD;  Location: [facility];   Service: Cardiovascular;  Laterality: N/A;   CARDIAC CATHETERIZATION N/A 11/04/2015   Procedure: Coronary Stent Intervention;  Surgeon: Peter M Martinique, MD;  Location: [facility];  Service: Cardiovascular;  Laterality: N/A;   CARDIAC CATHETERIZATION N/A 11/06/2015   Procedure: Coronary Stent Intervention;  Surgeon: Leonie Man, MD;  Location: [facility];  Service: Cardiovascular;  Laterality: N/A;   CATARACT EXTRACTION, BILATERAL  2013   CHOLECYSTECTOMY  1986   COLON SURGERY  1980's   pt. had ileus, had colon surgery, requiring colostomy & then reversal & then dehisence of that wound & return to OR for repair & cholecystectomy     COLONOSCOPY     COLOSTOMY  1985   After colectomy for diverticulitis, pt. remarks during this surgery they "gave me the paddles two times  because of bleeding", pt. states it was unrelated to any anesthesia complication   EYE SURGERY     /w IOL   KNEE ARTHROSCOPY Left 2003   [place], Right w/cartilage removed later   PARATHYROIDECTOMY N/A 05/12/2014   Procedure: PARATHYROIDECTOMY;  Surgeon: Armandina Gemma, MD;  Location: [facility];  Service: General;  Laterality: N/A;   REVERSAL OF [place] Left    Remote complicated w/infection   Family History  Problem Relation Age of Onset   Coronary artery disease Mother    Alcohol abuse Father    Cirrhosis Father    Heart failure Sister    Breast cancer Sister        W/involvement of right arm leading to amputation   Coronary artery disease Brother    Heart attack Brother    Non-Hodgkin's lymphoma Brother    Clotting disorder Brother    Non-Hodgkin's lymphoma Brother    Anemia Sister        related to treatment to lymphoma   Non-Hodgkin's lymphoma Sister    Prostate cancer Neg Hx    Colon cancer Neg Hx    Diabetes Neg Hx    Glaucoma Neg Hx    Rectal cancer Neg Hx    Esophageal cancer Neg Hx    Social History   Socioeconomic  History   Marital status: Married    Spouse name: Not on file   Number of children: 3   Years of education: Not on file   Highest education level: Not on file  Occupational History   Not on file  Tobacco Use   Smoking status: Former    Packs/day: 2.00    Years: 37.00    Pack years: 74.00    Types: Cigarettes    Quit date: 03/11/1983    Years since quitting: 38.2   Smokeless tobacco: Never  Tobacco comments:    started smoking in the service; ICU 85' part of his stomach; appendix; coma   Vaping Use   Vaping Use: Never used  Substance and Sexual Activity   Alcohol use: Yes    Alcohol/week: 0.0 standard drinks    Comment: RARE   Drug use: No   Sexual activity: Not on file  Other Topics Concern   Not on file  Social History Narrative   [place], [place].   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      Hobbies: fishing, busy volunteering            Social Determinants of Radio broadcast assistant Strain: Low Risk    Difficulty of Paying Living Expenses: Not hard at all  Food Insecurity: No Food Insecurity   Worried About Charity fundraiser in the Last Year: Never true   Arboriculturist in the Last Year: Never true  Transportation Needs: No Transportation Needs   Lack of Transportation (Medical): No   Lack of Transportation (Non-Medical): No  Physical Activity: Sufficiently Active   Days of Exercise per Week: 3 days   Minutes of Exercise per Session: 60 min  Stress: No Stress Concern Present   Feeling of Stress : Not at all  Social Connections: Socially Integrated   Frequency of Communication with Friends and Family: Once a week   Frequency of Social Gatherings with Friends and Family: More than  three times a week   Attends Religious Services: More than 4 times per year   Active Member of [employer] or Organizations: Yes   Attends Archivist Meetings: 1 to 4 times per year   Marital Status: Married    Tobacco Counseling Counseling given: Not Answered Tobacco comments: started smoking in the service; ICU 85' part of his stomach; appendix; coma    Clinical Intake:  Pre-visit preparation completed: Yes  Pain : 0-10 Pain Type: Chronic pain Pain Location: Back Pain Descriptors / Indicators: Aching Pain Onset: More than a month ago Pain Frequency: Intermittent     BMI - recorded: 26.86 Nutritional Status: BMI 25 -29 Overweight Nutritional Risks: None Diabetes: No  How often do you need to have someone help you when you read instructions, pamphlets, or other written materials from your doctor or pharmacy?: 1 - Never  Diabetic?no  Interpreter Needed?: No  Information entered by :: Charlott Rakes, LPN   Activities of Daily Living In your present state of health, do you have any difficulty performing the following activities: 05/17/2021 11/11/2020  Hearing? N N  Vision? N N  Difficulty concentrating or making decisions? N N  Walking or climbing stairs? Y Y  Comment coming down is a problem -  Dressing or bathing? N N  Doing errands, shopping? N N  Preparing Food and eating ? N -  Using the Toilet? N -  In the past six months, have you accidently leaked urine? Y -  Comment wears a pad -  Do you have problems with loss of bowel control? N -  Managing your Medications? N -  Managing your Finances? N -  Housekeeping or managing your Housekeeping? N -  Some recent data might be hidden    Patient Care Team: [name], MD as PCP - General (Family Medicine) Martinique, Peter M, MD as PCP - Cardiology (Cardiology) Lowella Bandy, MD (Inactive) (Urology) Ladene Artist, MD (Gastroenterology) Martinique, Peter M, MD as Consulting Physician (Cardiology) Keene Breath., MD as Consulting Physician (Ophthalmology) Madelin Rear, [facility] as Pharmacist (Pharmacist) Festus Aloe, MD as Consulting Physician (Urology) Edythe Clarity, [facility] as Pharmacist (Pharmacist)  Indicate any recent Medical Services you may have received from other than [facility] providers in the past year (date may be approximate).     Assessment:   This is a routine wellness examination for Jose Ware.  Hearing/Vision screen Hearing Screening - Comments:: Pt denies any hearing issue  Vision Screening - Comments:: Pt follows up with Dr Chilton Greathouse for annual eye exams   Dietary issues and exercise activities discussed: Current Exercise Habits: Home exercise routine, Type of exercise: Other - see comments, Time (Minutes): 60, Frequency (Times/Week): 3, Weekly Exercise (Minutes/Week): 180   Goals Addressed             This Visit's Progress    Patient Stated       Stay as healthy as possible        Depression Screen PHQ 2/9 Scores 05/17/2021 12/27/2020 06/25/2020 05/10/2020 04/04/2019 12/17/2018 05/11/2018  PHQ - 2 Score 0 0 0 0 0 0 0    Fall Risk Fall Risk  05/17/2021 12/27/2020 06/25/2020 05/10/2020 06/20/2019  Falls in the past year? 1 1 0 1 0  Number falls in past yr: 1 0 0 1 0  Injury with Fall? 0 0 0 0 0  Risk for fall due to : Impaired vision;Impaired balance/gait Impaired balance/gait;History of fall(s);Impaired mobility - Impaired balance/gait;Impaired mobility -  Follow up Falls prevention discussed - - Falls prevention discussed -    FALL RISK PREVENTION PERTAINING TO THE HOME:  Any stairs in or around the home? No  If so, are there any without handrails? No  Home free of loose throw rugs in walkways, pet beds, electrical cords, etc? Yes  Adequate lighting in your home to reduce risk of falls? Yes   ASSISTIVE DEVICES UTILIZED TO PREVENT FALLS:  Life alert? No  Use of a cane, walker or w/c? Yes  Grab bars in the bathroom? Yes  Shower chair or bench in shower? Yes   Elevated toilet seat or a handicapped toilet? No   TIMED UP AND GO:  Was the test performed? Yes .  Length of time to ambulate 10 feet: 20 sec.   Gait slow and steady with assistive device  Cognitive Function: MMSE - Mini Mental State Exam 05/07/2017  Not completed: (No Data)     6CIT Screen 05/17/2021 05/10/2020 04/04/2019  What Year? 0 points 0 points 0 points  What month? 0 points 0 points 0 points  What time? 0 points - 0 points  Count back from 20 0 points 0 points 0 points  Months in reverse 0 points 0 points 0 points  Repeat phrase 0 points 2 points 0 points  Total Score 0 - 0    Immunizations Immunization History  Administered Date(s) Administered   Fluad Quad(high Dose 65+) 12/17/2018, 12/23/2019, 12/25/2020   Influenza Split 01/06/2012   Influenza Whole 12/19/2008, 12/27/2009   Influenza, High Dose Seasonal PF 12/21/2012, 12/22/2014, 02/05/2017, 12/08/2017   Influenza,inj,Quad PF,6+ Mos 12/01/2013, 10/24/2015   PFIZER(Purple Top)SARS-COV-2 Vaccination 04/07/2019, 04/21/2019, 12/08/2019, 07/14/2020   Pfizer Covid-19 Office manager  76yr & up 12/29/2020   Pneumococcal Conjugate-13 09/14/2015   Pneumococcal Polysaccharide-23 02/12/2009   Td 03/12/2003   Tdap 02/27/2014   Zoster Recombinat (Shingrix) 11/24/2017, 01/26/2018    TDAP status: Up to date  Flu Vaccine status: Up to date  Pneumococcal vaccine status: Up to date  Covid-19 vaccine status: Completed vaccines  Qualifies for Shingles Vaccine? Yes   Zostavax completed Yes   Shingrix Completed?: Yes  Screening Tests Health Maintenance  Topic Date Due   COLONOSCOPY (Pts [phone] coverage will need to be confirmed)  12/16/2018   TETANUS/TDAP  02/28/2024   Pneumonia Vaccine 85Years old  Completed   INFLUENZA VACCINE  Completed   COVID-19 Vaccine  Completed   Zoster Vaccines- Shingrix  Completed   HPV VACCINES  Aged Out    Health Maintenance  Health Maintenance Due  Topic  Date Due   COLONOSCOPY (Pts [phone] coverage will need to be confirmed)  12/16/2018    Colorectal cancer screening: Referral to GI placed 06/05/21 per pt scheduled . Pt aware the office will call re: appt.   Additional Screening:   Vision Screening: Recommended annual ophthalmology exams for early detection of glaucoma and other disorders of the eye. Is the patient up to date with their annual eye exam?  Yes  Who is the provider or what is the name of the office in which the patient attends annual eye exams? Dr WooSherral Hammersf pt is not established with a provider, would they like to be referred to a provider to establish care? No .   Dental Screening: Recommended annual dental exams for proper oral hygiene  Community Resource Referral / Chronic Care Management: CRR required this visit?  No   CCM required this visit?  No      Plan:     I have personally reviewed and noted the following in the patients chart:   Medical and social history Use of alcohol, tobacco or illicit drugs  Current medications and supplements including opioid prescriptions. Patient is not currently taking opioid prescriptions. Functional ability and status Nutritional status Physical activity Advanced directives List of other physicians Hospitalizations, surgeries, and ER visits in previous 12 months Vitals Screenings to include cognitive, depression, and falls Referrals and appointments  In addition, I have reviewed and discussed with patient certain preventive protocols, quality metrics, and best practice recommendations. A written personalized care plan for preventive services as well as general preventive health recommendations were provided to patient.     TinWillette BracePN   05/18/35/0488Nurse Notes: None

## 2021-05-17 NOTE — Patient Instructions (Signed)
Jose Ware , Thank you for taking time to come for your Medicare Wellness Visit. I appreciate your ongoing commitment to your health goals. Please review the following plan we discussed and let me know if I can assist you in the future.   Screening recommendations/referrals: Colonoscopy: Pt stated scheduled for 06/05/21 Recommended yearly ophthalmology/optometry visit for glaucoma screening and checkup Recommended yearly dental visit for hygiene and checkup  Vaccinations: Influenza vaccine: Done 12/25/20 repeat every year  Pneumococcal vaccine: Up to date Tdap vaccine: Done 02/27/14 repeat every 10 years  Shingles vaccine: Completed 9/17, 01/26/18    Covid-19: Completed 1/28, 2/1, 12/08/19 & 5/7, 12/29/20  Advanced directives: Please bring a copy of your health care power of attorney and living will to the office at your convenience.   Conditions/risks identified: Stay as healthy as can   Next appointment: Follow up in one year for your annual wellness visit.   Preventive Care 65 Years and Older, Male Preventive care refers to lifestyle choices and visits with your health care provider that can promote health and wellness. What does preventive care include? A yearly physical exam. This is also called an annual well check. Dental exams once or twice a year. Routine eye exams. Ask your health care provider how often you should have your eyes checked. Personal lifestyle choices, including: Daily care of your teeth and gums. Regular physical activity. Eating a healthy diet. Avoiding tobacco and drug use. Limiting alcohol use. Practicing safe sex. Taking low doses of aspirin every day. Taking vitamin and mineral supplements as recommended by your health care provider. What happens during an annual well check? The services and screenings done by your health care provider during your annual well check will depend on your age, overall health, lifestyle risk factors, and family history  of disease. Counseling  Your health care provider may ask you questions about your: Alcohol use. Tobacco use. Drug use. Emotional well-being. Home and relationship well-being. Sexual activity. Eating habits. History of falls. Memory and ability to understand (cognition). Work and work Statistician. Screening  You may have the following tests or measurements: Height, weight, and BMI. Blood pressure. Lipid and cholesterol levels. These may be checked every 5 years, or more frequently if you are over 30 years old. Skin check. Lung cancer screening. You may have this screening every year starting at age 21 if you have a 30-pack-year history of smoking and currently smoke or have quit within the past 15 years. Fecal occult blood test (FOBT) of the stool. You may have this test every year starting at age 6. Flexible sigmoidoscopy or colonoscopy. You may have a sigmoidoscopy every 5 years or a colonoscopy every 10 years starting at age 40. Prostate cancer screening. Recommendations will vary depending on your family history and other risks. Hepatitis C blood test. Hepatitis B blood test. Sexually transmitted disease (STD) testing. Diabetes screening. This is done by checking your blood sugar (glucose) after you have not eaten for a while (fasting). You may have this done every 1-3 years. Abdominal aortic aneurysm (AAA) screening. You may need this if you are a current or former smoker. Osteoporosis. You may be screened starting at age 39 if you are at high risk. Talk with your health care provider about your test results, treatment options, and if necessary, the need for more tests. Vaccines  Your health care provider may recommend certain vaccines, such as: Influenza vaccine. This is recommended every year. Tetanus, diphtheria, and acellular pertussis (Tdap, Td) vaccine. You may  need a Td booster every 10 years. Zoster vaccine. You may need this after age 80. Pneumococcal 13-valent  conjugate (PCV13) vaccine. One dose is recommended after age 59. Pneumococcal polysaccharide (PPSV23) vaccine. One dose is recommended after age 58. Talk to your health care provider about which screenings and vaccines you need and how often you need them. This information is not intended to replace advice given to you by your health care provider. Make sure you discuss any questions you have with your health care provider. Document Released: 03/23/2015 Document Revised: 11/14/2015 Document Reviewed: 12/26/2014 Elsevier Interactive Patient Education  2017 [employer] Prevention in the Home Falls can cause injuries. They can happen to people of all ages. There are many things you can do to make your home safe and to help prevent falls. What can I do on the outside of my home? Regularly fix the edges of walkways and driveways and fix any cracks. Remove anything that might make you trip as you walk through a door, such as a raised step or threshold. Trim any bushes or trees on the path to your home. Use bright outdoor lighting. Clear any walking paths of anything that might make someone trip, such as rocks or tools. Regularly check to see if handrails are loose or broken. Make sure that both sides of any steps have handrails. Any raised decks and porches should have guardrails on the edges. Have any leaves, snow, or ice cleared regularly. Use sand or salt on walking paths during winter. Clean up any spills in your garage right away. This includes oil or grease spills. What can I do in the bathroom? Use night lights. Install grab bars by the toilet and in the tub and shower. Do not use towel bars as grab bars. Use non-skid mats or decals in the tub or shower. If you need to sit down in the shower, use a plastic, non-slip stool. Keep the floor dry. Clean up any water that spills on the floor as soon as it happens. Remove soap buildup in the tub or shower regularly. Attach bath mats  securely with double-sided non-slip rug tape. Do not have throw rugs and other things on the floor that can make you trip. What can I do in the bedroom? Use night lights. Make sure that you have a light by your bed that is easy to reach. Do not use any sheets or blankets that are too big for your bed. They should not hang down onto the floor. Have a firm chair that has side arms. You can use this for support while you get dressed. Do not have throw rugs and other things on the floor that can make you trip. What can I do in the kitchen? Clean up any spills right away. Avoid walking on wet floors. Keep items that you use a lot in easy-to-reach places. If you need to reach something above you, use a strong step stool that has a grab bar. Keep electrical cords out of the way. Do not use floor polish or wax that makes floors slippery. If you must use wax, use non-skid floor wax. Do not have throw rugs and other things on the floor that can make you trip. What can I do with my stairs? Do not leave any items on the stairs. Make sure that there are handrails on both sides of the stairs and use them. Fix handrails that are broken or loose. Make sure that handrails are as long as the stairways.  Check any carpeting to make sure that it is firmly attached to the stairs. Fix any carpet that is loose or worn. Avoid having throw rugs at the top or bottom of the stairs. If you do have throw rugs, attach them to the floor with carpet tape. Make sure that you have a light switch at the top of the stairs and the bottom of the stairs. If you do not have them, ask someone to add them for you. What else can I do to help prevent falls? Wear shoes that: Do not have high heels. Have rubber bottoms. Are comfortable and fit you well. Are closed at the toe. Do not wear sandals. If you use a stepladder: Make sure that it is fully opened. Do not climb a closed stepladder. Make sure that both sides of the stepladder  are locked into place. Ask someone to hold it for you, if possible. Clearly mark and make sure that you can see: Any grab bars or handrails. First and last steps. Where the edge of each step is. Use tools that help you move around (mobility aids) if they are needed. These include: Canes. Walkers. Scooters. Crutches. Turn on the lights when you go into a dark area. Replace any light bulbs as soon as they burn out. Set up your furniture so you have a clear path. Avoid moving your furniture around. If any of your floors are uneven, fix them. If there are any pets around you, be aware of where they are. Review your medicines with your doctor. Some medicines can make you feel dizzy. This can increase your chance of falling. Ask your doctor what other things that you can do to help prevent falls. This information is not intended to replace advice given to you by your health care provider. Make sure you discuss any questions you have with your health care provider. Document Released: 12/21/2008 Document Revised: 08/02/2015 Document Reviewed: 03/31/2014 Elsevier Interactive Patient Education  2017 [employer].

## 2021-05-20 ENCOUNTER — Ambulatory Visit: Payer: Medicare Other | Admitting: Family Medicine

## 2021-05-20 NOTE — Progress Notes (Incomplete)
Phone [phone] In person visit   Subjective:   Jose Ware is a 87 y.o. year old very pleasant male patient who presents for/with See problem oriented charting No chief complaint on file.   This visit occurred during the SARS-CoV-2 public health emergency.  Safety protocols were in place, including screening questions prior to the visit, additional usage of staff PPE, and extensive cleaning of exam room while observing appropriate contact time as indicated for disinfecting solutions.   Past Medical History-  Patient Active Problem List   Diagnosis Date Noted   Follicular lymphoma ([place]) 04/11/2021   Partial small bowel obstruction ([place]) 11/13/2020   OSA (obstructive sleep apnea) 05/08/2020   Pain of back and left lower extremity 06/08/2019   Left knee pain 06/06/2019   Onychomycosis 10/28/2017   Spondylosis 06/25/2017   Post herpetic neuralgia 06/20/2017   Spinal stenosis of lumbar region at multiple levels 04/30/2017   Drug reaction 02/05/2017   Senile purpura ([place]) 02/05/2017   Dyslipidemia 11/16/2015   History of ventricular tachycardia 11/08/2015   CAD S/P PCI    Ischemic cardiomyopathy    History of embolic stroke    Post-traumatic osteoarthritis of right wrist 04/25/2015   History of adenomatous polyp of colon 01/18/2015   Primary hyperparathyroidism ([place]) 12/28/2013   History of stomach cancer 12/01/2013   Fatigue 12/01/2013   Lower back pain 03/24/2013   Bradycardia 09/23/2012   B12 deficiency 09/17/2010   History of prostate cancer 12/14/2008   PERIPHERAL NEUROPATHY 10/18/2007   PSORIASIS 10/15/2007   Essential hypertension 11/27/2006   DEGENERATIVE JOINT DISEASE 11/27/2006   NEPHROLITHIASIS, HX OF 11/27/2006    Medications- reviewed and updated Current Outpatient Medications  Medication Sig Dispense Refill   acetaminophen (TYLENOL) 650 MG CR tablet Take 650 mg by mouth 2 (two) times daily as needed for pain (headache).     amLODipine (NORVASC)  5 MG tablet Take 1 tablet by mouth once daily 90 tablet 0   aspirin EC 81 MG tablet Take 1 tablet (81 mg total) by mouth daily. 90 tablet 1   atorvastatin (LIPITOR) 10 MG tablet Take 1 tablet (10 mg total) by mouth daily. 90 tablet 3   carvedilol (COREG) 3.125 MG tablet Take 1 tablet by mouth twice daily 180 tablet 3   Cyanocobalamin (VITAMIN B-12) 5000 MCG TBDP Take 5,000 mcg by mouth every morning.     losartan (COZAAR) 100 MG tablet Take 1 tablet by mouth once daily (Patient taking differently: Take 100 mg by mouth every morning.) 90 tablet 3   Multiple Vitamins-Minerals (OCUVITE ADULT 50+ PO)      nitroGLYCERIN (NITROSTAT) 0.4 MG SL tablet Place 1 tablet (0.4 mg total) under the tongue every 5 (five) minutes x 3 doses as needed for chest pain. 25 tablet 2   PFIZER COVID-19 VAC BIVALENT injection      spironolactone (ALDACTONE) 25 MG tablet Take 1 tablet by mouth once daily 90 tablet 1   No current facility-administered medications for this visit.     Objective:  There were no vitals taken for this visit. Gen: NAD, resting comfortably CV: RRR no murmurs rubs or gallops Lungs: CTAB no crackles, wheeze, rhonchi Abdomen: soft/nontender/nondistended/normal bowel sounds. No rebound or guarding.  Ext: no edema Skin: warm, dry Neuro: grossly normal, moves all extremities  ***    Assessment and Plan   HAPPY BELATED BIRTHDAY!!!   #Non hodgkins lymphoma/follicular lymphoma- diagnosed 2022- monitoring with Dr. Lorenso Ware***   #Hypertension S: Compliant with losartan 100  mg, spironolactone 25 mg, Coreg 3.125 mg twice daily, amlodipine 5 mg. Home readings #s: *** BP Readings from Last 3 Encounters:  05/17/21 130/68  05/07/21 122/70  04/15/21 (!) 144/75  A/P: ***   #Hyperlipidemia/CAD status post PCI after STEMI August 2017 S: Patient is compliant with atorvastatin 10 mg  -in the past atorvastatin 40 mg--> reduce to 20 mg October 2021 with LDL at 25. LDL been well under 70. He is also  compliant with aspirin- prior was on Plavix as well but had easy bruising/bleeding Lab Results  Component Value Date   CHOL 66 12/27/2020   HDL 31.00 (L) 12/27/2020   LDLCALC 22 12/27/2020   LDLDIRECT 33.0 06/25/2020   TRIG 63.0 12/27/2020   CHOLHDL 2 12/27/2020   A/P: ***  #OSA on CPAP- started sept 2022 followed with Dr. Halford Chessman   Other notes: 1. Senile purpura-stable on aspirin alone 2. History primary hyperparathyroidism- treated with parathyroid surgery but will continue to monitor calcium levels 3. B12 deficiency-takes over-the-counter B12 supplement***    Health Maintenance Due  Topic Date Due   COLONOSCOPY (Pts 45-97yr Insurance coverage will need to be confirmed)  12/16/2018   Recommended follow up: No follow-ups on file. Future Appointments  Date Time Provider DMarlette 05/28/2021  9:30 AM [facility] CCM PHARMACIST [facility]  05/30/2021 11:00 AM HMarin Olp MD [facility]  06/05/2021  2:30 PM SLadene Artist MD [facility]  07/04/2021 10:00 AM HMarin Olp MD [facility]  07/09/2021 10:00 AM [facility] LAB [facility] None  07/09/2021 10:30 AM DOrson Slick MD [facility] None  05/30/2022 10:15 AM [facility] HEALTH COACH [facility]    Lab/Order associations: No diagnosis found.  No orders of the defined types were placed in this encounter.   I,[name],acting as a scribe for SGarret Reddish MD.,have documented all relevant documentation on the behalf of SGarret Reddish MD,as directed by  SGarret Reddish MD while in the presence of SGarret Reddish MD.  *** Return precautions advised.  JBurnett Corrente

## 2021-05-22 ENCOUNTER — Telehealth: Payer: Self-pay | Admitting: Pharmacist

## 2021-05-22 NOTE — Progress Notes (Signed)
? ? ?Chronic Care Management ?Pharmacy Assistant  ? ?Name: Jose Ware  MRN: [id number] DOB: [birth year] ? ? ?Reason for Encounter: Chart Review For Initial Visit With Clinical Pharmacist ?  ?Conditions to be addressed/monitored: ?HTN, CAD, Dyslipidemia, Follicular lymphoma ? ?Primary concerns for visit include: ?HTN  ? ?Recent office visits:  ?12/28/2020 OV (PCP) [name], MD; no medication changes indicated. ? ?Recent consult visits:  ?05/07/2021 OV (Gastroenterology) Ladene Artist, MD; no medication changes indicated - You have been scheduled for a colonoscopy. Please follow written instructions given to you at your visit today.  ?Please pick up your prep supplies at the pharmacy within the next 1-3 days. ?If you use inhalers (even only as needed), please bring them with you on the day of your procedure ? ?04/15/2021 OV (Cardiology) Martinique, Peter M, MD; no medication changes indicated. ? ?04/11/2021 OV (Oncology) Orson Slick, MD; --No clear indication to start treatment at this time as patient does not meet any GELF Criteria ? ?01/09/2021 OV (Oncology) Orson Slick, MD; --No clear indication to start treatment at this time as patient does not meet any GELF Criteria. ? ?12/28/2020 OV (Pulmonology) Chesley Mires, MD; no medication changes indicated. ? ?12/05/2020 [facility] (Oncology) Benay Pike, MD; no medication changes indicated. ? ?Hospital visits:  ?None in previous 6 months ? ?Medications: ?Outpatient Encounter Medications as of 05/22/2021  ?Medication Sig  ? acetaminophen (TYLENOL) 650 MG CR tablet Take 650 mg by mouth 2 (two) times daily as needed for pain (headache).  ? amLODipine (NORVASC) 5 MG tablet Take 1 tablet by mouth once daily  ? aspirin EC 81 MG tablet Take 1 tablet (81 mg total) by mouth daily.  ? atorvastatin (LIPITOR) 10 MG tablet Take 1 tablet (10 mg total) by mouth daily.  ? carvedilol (COREG) 3.125 MG tablet Take 1 tablet by mouth twice daily  ? Cyanocobalamin  (VITAMIN B-12) 5000 MCG TBDP Take 5,000 mcg by mouth every morning.  ? losartan (COZAAR) 100 MG tablet Take 1 tablet by mouth once daily (Patient taking differently: Take 100 mg by mouth every morning.)  ? Multiple Vitamins-Minerals (OCUVITE ADULT 50+ PO)   ? nitroGLYCERIN (NITROSTAT) 0.4 MG SL tablet Place 1 tablet (0.4 mg total) under the tongue every 5 (five) minutes x 3 doses as needed for chest pain.  ? PFIZER COVID-19 VAC BIVALENT injection   ? spironolactone (ALDACTONE) 25 MG tablet Take 1 tablet by mouth once daily  ? ?No facility-administered encounter medications on file as of 05/22/2021.  ? ?Current Medications: ?Multiple Vitamins-Minerals does not take daily ?Amlodipine 5 mg last filled 03/12/2021 90 DS ?Spironolactone 25 mg last filled 04/04/2021 90 DS ?Atorvastatin 10 mg last filled 03/30/2021 90 DS ?Nitroglycerin 0.4 mg last filled 02/24/2021 15 DS ?Carvedilol 3.125 mg last filled 02/23/2021 90 DS ?Acetaminophen 650 mg CR takes as needed ?Losartan 100 mg last filled 04/20/2021 90 DS ?Aspirin 81 mg takes daily ?Vitamin B-12 5000 mcg takes daily ?Occuvite takes daily ? ?Patient Questions: ?Any changes in your medications or health? ?Patient denies having any recent changes in his medications or health. ? ?Any side effects from any medications?  ?Patient denies any side effects from any of his medications. ? ?Do you have any symptoms or problems not managed by your medications? ?Patient denies having any symptoms or problems that is not currently managed by his medications. ? ?Any concerns about your health right now? ?Patient denies having any concerns with his health right now. ? ?Has  your provider asked that you check blood pressure, blood sugar, or follow special diet at home? ?Patient states he checks his blood pressure. He does not follow any special diet. ? ?Do you get any type of exercise on a regular basis? ?Patient states he goes to gym 2-3 times a week. ? ?Can you think of a goal you would like  to reach for your health? ?"To survive." ? ?Do you have any problems getting your medications? ?Patient denies having any problems getting his medications. ? ?Is there anything that you would like to discuss during the appointment?  ?Patient states he doesn't have anything he would like to discuss. ? ?Please bring medications and supplements to appointment ? ?Care Gaps: ?Medicare Annual Wellness: Completed 05/17/2021 ?Hemoglobin A1C: 5.3% on 11/04/2015 ?Colonoscopy: Overdue since 12/16/2018 ? ?Future Appointments  ?Date Time Provider [place]  ?05/28/2021  9:30 AM [facility] CCM PHARMACIST [facility]  ?05/30/2021 11:00 AM [name], MD [facility]  ?06/05/2021  2:30 PM Ladene Artist, MD [facility]  ?07/04/2021 10:00 AM [name], MD [facility]  ?07/09/2021 10:00 AM [facility] LAB [facility] None  ?07/09/2021 10:30 AM Orson Slick, MD [facility] None  ?05/30/2022 10:15 AM [facility] HEALTH COACH [facility]  ? ?Star Rating Drugs: ?Atorvastatin 10 mg last filled 03/30/2021 90 DS ?Losartan 100 mg last filled 04/20/2021 90 DS ? ?April D Calhoun, [place] ?Clinical Pharmacist Assistant ?[phone]  ?

## 2021-05-23 NOTE — Progress Notes (Signed)
? ?Chronic Care Management ?Pharmacy Note ? ?05/28/2021 ?Name:  Jose Ware MRN:  [id number] DOB:  12-08-1934 ? ?Summary: ?Initial visit with PharmD.  Patient is doing excellent no concerns with medications.  Very active and BP/lipids are well controlled. ? ?Recommendations/Changes made from today's visit: ?None ? ?Plan: ?FU as needed ? ? ?Subjective: ?Jose Ware is an 87 y.o. year old male who is a primary patient of Hunter, Brayton Mars, MD.  The CCM team was consulted for assistance with disease management and care coordination needs.   ? ?Engaged with patient by telephone for initial visit in response to provider referral for pharmacy case management and/or care coordination services.  ? ?Consent to Services:  ?The patient was given the following information about Chronic Care Management services today, agreed to services, and gave verbal consent: 1. CCM service includes personalized support from designated clinical staff supervised by the primary care provider, including individualized plan of care and coordination with other care providers 2. 24/7 contact phone numbers for assistance for urgent and routine care needs. 3. Service will only be billed when office clinical staff spend 20 minutes or more in a month to coordinate care. 4. Only one practitioner may furnish and bill the service in a calendar month. 5.The patient may stop CCM services at any time (effective at the end of the month) by phone call to the office staff. 6. The patient will be responsible for cost sharing (co-pay) of up to 20% of the service fee (after annual deductible is met). Patient agreed to services and consent obtained. ? ?Patient Care Team: ?[name], MD as PCP - General (Family Medicine) ?Martinique, Peter M, MD as PCP - Cardiology (Cardiology) ?Lowella Bandy, MD (Inactive) (Urology) ?Ladene Artist, MD (Gastroenterology) ?[place], Peter M, MD as Consulting Physician (Cardiology) ?Keene Breath., MD as Consulting  Physician (Ophthalmology) ?Festus Aloe, MD as Consulting Physician (Urology) ?Edythe Clarity, [facility] as Pharmacist (Pharmacist) ? ?Recent office visits:  ?12/28/2020 OV (PCP) [name], MD; no medication changes indicated. ?  ?Recent consult visits:  ?05/07/2021 OV (Gastroenterology) Ladene Artist, MD; no medication changes indicated - You have been scheduled for a colonoscopy. Please follow written instructions given to you at your visit today.  ?Please pick up your prep supplies at the pharmacy within the next 1-3 days. ?If you use inhalers (even only as needed), please bring them with you on the day of your procedure ?  ?04/15/2021 OV (Cardiology) [place]ique, Peter M, MD; no medication changes indicated. ?  ?04/11/2021 OV (Oncology) Orson Slick, MD; --No clear indication to start treatment at this time as patient does not meet any GELF Criteria ?  ?01/09/2021 OV (Oncology) Orson Slick, MD; --No clear indication to start treatment at this time as patient does not meet any GELF Criteria. ?  ?12/28/2020 OV (Pulmonology) Chesley Mires, MD; no medication changes indicated. ?  ?12/05/2020 OV (Oncology) Benay Pike, MD; no medication changes indicated. ?  ?Hospital visits:  ?None in previous 6 months ? ?Objective: ? ?Lab Results  ?Component Value Date  ? CREATININE 1.19 04/11/2021  ? BUN 28 (H) 04/11/2021  ? GFR 63.69 12/27/2020  ? GFRNONAA >60 04/11/2021  ? GFRAA 72 12/23/2019  ? NA 140 04/11/2021  ? K 4.2 04/11/2021  ? CALCIUM 9.4 04/11/2021  ? CO2 31 04/11/2021  ? GLUCOSE 73 04/11/2021  ? ? ?Lab Results  ?Component Value Date/Time  ? HGBA1C 5.3 11/04/2015 05:45 PM  ? GFR  63.69 12/27/2020 11:31 AM  ? GFR 63.20 06/25/2020 10:30 AM  ?  ?Last diabetic Eye exam: No results found for: HMDIABEYEEXA  ?Last diabetic Foot exam: No results found for: HMDIABFOOTEX  ? ?Lab Results  ?Component Value Date  ? CHOL 66 12/27/2020  ? HDL 31.00 (L) 12/27/2020  ? [place] 22 12/27/2020  ? LDLDIRECT 33.0  06/25/2020  ? TRIG 63.0 12/27/2020  ? CHOLHDL 2 12/27/2020  ? ? ?Hepatic Function Latest Ref Rng & Units 04/11/2021 12/27/2020 12/05/2020  ?Total Protein 6.5 - 8.1 g/dL 7.0 6.0 6.4(L)  ?Albumin 3.5 - 5.0 g/dL 4.0 3.7 3.2(L)  ?AST 15 - 41 U/L 11(L) 10 10(L)  ?ALT 0 - 44 U/L _0 ?Alk Phosphatase 38 - 126 U/L 72 67 72  ?Total Bilirubin 0.3 - 1.2 mg/dL 1.1 1.2 0.9  ?Bilirubin, Direct 0.00 - 0.40 mg/dL - - -  ? ? ?Lab Results  ?Component Value Date/Time  ? TSH 3.09 06/20/2019 12:05 PM  ? TSH 2.17 12/17/2018 10:27 AM  ? ? ?CBC Latest Ref Rng & Units 04/11/2021 12/27/2020 12/17/2020  ?WBC 4.0 - 10.5 K/uL 8.3 7.4 8.6  ?Hemoglobin 13.0 - 17.0 g/dL 12.9(L) 11.9(L) 12.3(L)  ?Hematocrit 39.0 - 52.0 % 39.6 35.7(L) 37.3(L)  ?Platelets 150 - 400 K/uL 218 227.0 265  ? ? ?No results found for: VD25OH ? ?Clinical ASCVD: Yes  ?The ASCVD Risk score (Arnett DK, et al., 2019) failed to calculate for the following reasons: ?  The 2019 ASCVD risk score is only valid for ages 49 to 73   ? ?Depression screen [facility] 2/9 05/17/2021 12/27/2020 06/25/2020  ?Decreased Interest 0 0 0  ?Down, Depressed, Hopeless 0 0 0  ?PHQ - 2 Score 0 0 0  ?Some recent data might be hidden  ?  ? ?Social History  ? ?Tobacco Use  ?Smoking Status Former  ? Packs/day: 2.00  ? Years: 37.00  ? Pack years: 74.00  ? Types: Cigarettes  ? Quit date: 03/11/1983  ? Years since quitting: 38.2  ?Smokeless Tobacco Never  ?Tobacco Comments  ? started smoking in the service; ICU 85' part of his stomach; appendix; coma   ? ?BP Readings from Last 3 Encounters:  ?05/17/21 130/68  ?05/07/21 122/70  ?04/15/21 (!) 144/75  ? ?Pulse Readings from Last 3 Encounters:  ?05/17/21 63  ?05/07/21 60  ?04/15/21 (!) 54  ? ?Wt Readings from Last 3 Encounters:  ?05/17/21 203 lb 9.6 oz (92.4 kg)  ?05/07/21 204 lb 2 oz (92.6 kg)  ?04/15/21 206 lb 12.8 oz (93.8 kg)  ? ?BMI Readings from Last 3 Encounters:  ?05/17/21 26.86 kg/m?  ?05/07/21 26.93 kg/m?  ?04/15/21 27.28 kg/m?  ? ? ?Assessment/Interventions:  Review of patient past medical history, allergies, medications, health status, including review of consultants reports, laboratory and other test data, was performed as part of comprehensive evaluation and provision of chronic care management services.  ? ?SDOH:  (Social Determinants of Health) assessments and interventions performed: Yes ? ?Food Insecurity: No Food Insecurity  ? Worried About Charity fundraiser in the Last Year: Never true  ? Ran Out of Food in the Last Year: Never true  ? ?Financial Resource Strain: Low Risk   ? Difficulty of Paying Living Expenses: Not hard at all  ? ? ?SDOH Screenings  ? ?Alcohol Screen: Not on file  ?Depression (PHQ2-9): Low Risk   ? PHQ-2 Score: 0  ?Financial Resource Strain: Low Risk   ? Difficulty of Paying Living Expenses: Not  hard at all  ?Food Insecurity: No Food Insecurity  ? Worried About Charity fundraiser in the Last Year: Never true  ? Ran Out of Food in the Last Year: Never true  ?Housing: Low Risk   ? Last Housing Risk Score: 0  ?Physical Activity: Sufficiently Active  ? Days of Exercise per Week: 3 days  ? Minutes of Exercise per Session: 60 min  ?Social Connections: Socially Integrated  ? Frequency of Communication with Friends and Family: Once a week  ? Frequency of Social Gatherings with Friends and Family: More than three times a week  ? Attends Religious Services: More than 4 times per year  ? Active Member of Clubs or Organizations: Yes  ? Attends Archivist Meetings: 1 to 4 times per year  ? Marital Status: Married  ?Stress: No Stress Concern Present  ? Feeling of Stress : Not at all  ?Tobacco Use: Medium Risk  ? Smoking Tobacco Use: Former  ? Smokeless Tobacco Use: Never  ? Passive Exposure: Not on file  ?Transportation Needs: No Transportation Needs  ? Lack of Transportation (Medical): No  ? Lack of Transportation (Non-Medical): No  ? ? ?CCM Care Plan ? ?Allergies  ?Allergen Reactions  ? Bee Venom Swelling  ?  Facial swelling  ? Betadine  [Povidone Iodine] Other (See Comments)  ?  blistering  ? Doxycycline   ?  Possible drug rash- back of right lower leg and onto left lower leg as well  ? ? ?Medications Reviewed Today   ? ? Reviewed by Edythe Clarity,

## 2021-05-27 DIAGNOSIS — G4733 Obstructive sleep apnea (adult) (pediatric): Secondary | ICD-10-CM | POA: Diagnosis not present

## 2021-05-28 ENCOUNTER — Ambulatory Visit (INDEPENDENT_AMBULATORY_CARE_PROVIDER_SITE_OTHER): Payer: Medicare Other | Admitting: Pharmacist

## 2021-05-28 DIAGNOSIS — E785 Hyperlipidemia, unspecified: Secondary | ICD-10-CM

## 2021-05-28 DIAGNOSIS — I2510 Atherosclerotic heart disease of native coronary artery without angina pectoris: Secondary | ICD-10-CM

## 2021-05-28 DIAGNOSIS — I10 Essential (primary) hypertension: Secondary | ICD-10-CM

## 2021-05-28 NOTE — Patient Instructions (Addendum)
Visit Information ? ? Goals Addressed   ? ?  ?  ?  ?  ? This Visit's Progress  ?  Track and Manage My Blood Pressure-Hypertension     ?  Timeframe:  Long-Range Goal ?Priority:  High ?Start Date: 05/28/21                            ?Expected End Date: 11/28/21                     ? ?Follow Up Date 08/28/21  ?  ?- check blood pressure daily ?- choose a place to take my blood pressure (home, clinic or office, retail store) ?- write blood pressure results in a log or diary  ?  ?Why is this important?   ?You won't feel high blood pressure, but it can still hurt your blood vessels.  ?High blood pressure can cause heart or kidney problems. It can also cause a stroke.  ?Making lifestyle changes like losing a little weight or eating less salt will help.  ?Checking your blood pressure at home and at different times of the day can help to control blood pressure.  ?If the doctor prescribes medicine remember to take it the way the doctor ordered.  ?Call the office if you cannot afford the medicine or if there are questions about it.   ?  ?Notes:  ?  ? ?  ? ?Patient Care Plan: General Pharmacy (Adult)  ?  ? ?Problem Identified: HTN, CAD, HLD   ?Priority: High  ?Onset Date: 05/28/2021  ?  ? ?Long-Range Goal: Patient-Specific Goal   ?Start Date: 05/28/2021  ?Expected End Date: 11/28/2021  ?This Visit's Progress: On track  ?Priority: High  ?Note:   ?Current Barriers:  ?None at this time ? ?Pharmacist Clinical Goal(s):  ?Patient will contact provider office for questions/concerns as evidenced notation of same in electronic health record through collaboration with PharmD and provider.  ? ?Interventions: ?1:1 collaboration with [name], MD regarding development and update of comprehensive plan of care as evidenced by provider attestation and co-signature ?Inter-disciplinary care team collaboration (see longitudinal plan of care) ?Comprehensive medication review performed; medication list updated in electronic medical  record ? ?Hypertension (BP goal <130/80) ?-Controlled ?-Current treatment: ?Amlodipine '5mg'$  PM Appropriate, Effective, Safe, Accessible ?Carvedilol 3.'125mg'$  BID Appropriate, Effective, Safe, Accessible ?Losartan '100mg'$  AM Appropriate, Effective, Safe, Accessible ?Spironolactone '25mg'$  AM Appropriate, Effective, Safe, Accessible ?-Medications previously tried: none noted  ?-Current home readings: 120/60s ?-Current dietary habits: wife keeps him on a good diet ?-Current exercise habits: gym 2-3 times per week and walks around his house the other days.  Combination of weight bearing and cardiovascular exercise ?-Denies hypotensive/hypertensive symptoms ?-Educated on BP goals and benefits of medications for prevention of heart attack, stroke and kidney damage; ?Daily salt intake goal < 2300 mg; ?Exercise goal of 150 minutes per week; ?-Counseled to monitor BP at home as current, document, and provide log at future appointments ?-Recommended to continue current medication ? ?Hyperlipidemia/CAD: (LDL goal < 70) ?-Controlled ?-Current treatment: ?Atorvastatin '10mg'$  Appropriate, Effective, Safe, Accessible ?-Medications previously tried: higher doses of lipiutor  ? ?-Educated on Cholesterol goals;  ?Benefits of statin for ASCVD risk reduction; ?Importance of limiting foods high in cholesterol; ?-Recommended to continue current medication ?LDL is excellent ? ? ? ?Patient Goals/Self-Care Activities ?Patient will:  ?- take medications as prescribed as evidenced by patient report and record review ?check blood pressure a few  times per week, document, and provide at future appointments ? ?Follow Up Plan: The patient has been provided with contact information for the care management team and has been advised to call with any health related questions or concerns.  ? ?  ? ? ?Jose Ware was given information about Chronic Care Management services today including:  ?CCM service includes personalized support from designated clinical  staff supervised by his physician, including individualized plan of care and coordination with other care providers ?24/7 contact phone numbers for assistance for urgent and routine care needs. ?Standard insurance, coinsurance, copays and deductibles apply for chronic care management only during months in which we provide at least 20 minutes of these services. Most insurances cover these services at 100%, however patients may be responsible for any copay, coinsurance and/or deductible if applicable. This service may help you avoid the need for more expensive face-to-face services. ?Only one practitioner may furnish and bill the service in a calendar month. ?The patient may stop CCM services at any time (effective at the end of the month) by phone call to the office staff. ? ?Patient agreed to services and verbal consent obtained.  ? ?The patient verbalized understanding of instructions, educational materials, and care plan provided today and agreed to receive a mailed copy of patient instructions, educational materials, and care plan.  ?Telephone follow up appointment with pharmacy team member scheduled for: As needed follow up ? ?Edythe Clarity, [facility]  ?Beverly Milch, PharmD ?Clinical Pharmacist  ?Jose Ware ?([phone] ? ?

## 2021-05-29 NOTE — Progress Notes (Signed)
? ?Phone [phone] ?In person visit ?  ?Subjective:  ? ?Jose Ware is a 87 y.o. year old very pleasant male patient who presents for/with See problem oriented charting ?Chief Complaint  ?Patient presents with  ? Spot on Scalp  ?  Pt states he noticed a bump on his scalp after getting a hair cut 2 weeks ago.  ? ? ?This visit occurred during the SARS-CoV-2 public health emergency.  Safety protocols were in place, including screening questions prior to the visit, additional usage of staff PPE, and extensive cleaning of exam room while observing appropriate contact time as indicated for disinfecting solutions.  ? ?Past Medical History-  ?Patient Active Problem List  ? Diagnosis Date Noted  ? Drug reaction 02/05/2017  ?  Priority: Low  ? Senile purpura ([place]) 02/05/2017  ?  Priority: Low  ? History of ventricular tachycardia 11/08/2015  ?  Priority: Low  ? History of stomach cancer 12/01/2013  ?  Priority: Low  ? Lower back pain 03/24/2013  ?  Priority: Low  ? Bradycardia 09/23/2012  ?  Priority: Low  ? B12 deficiency 09/17/2010  ?  Priority: Low  ? PERIPHERAL NEUROPATHY 10/18/2007  ?  Priority: Low  ? DEGENERATIVE JOINT DISEASE 11/27/2006  ?  Priority: Low  ? NEPHROLITHIASIS, HX OF 11/27/2006  ?  Priority: Low  ? Pain of back and left lower extremity 06/08/2019  ?  Priority: 3.  ? Left knee pain 06/06/2019  ?  Priority: 3.  ? Onychomycosis 10/28/2017  ?  Priority: 3.  ? Spondylosis 06/25/2017  ?  Priority: 3.  ? Post-traumatic osteoarthritis of right wrist 04/25/2015  ?  Priority: 3.  ? Partial small bowel obstruction ([place]) 11/13/2020  ?  Priority: High  ? CAD S/P PCI   ?  Priority: High  ? Ischemic cardiomyopathy   ?  Priority: High  ? History of embolic stroke   ?  Priority: High  ? Primary hyperparathyroidism ([place]) 12/28/2013  ?  Priority: High  ? Fatigue 12/01/2013  ?  Priority: High  ? History of prostate cancer 12/14/2008  ?  Priority: High  ? OSA (obstructive sleep apnea) 05/08/2020  ?  Priority:  Medium   ? Post herpetic neuralgia 06/20/2017  ?  Priority: Medium   ? Spinal stenosis of lumbar region at multiple levels 04/30/2017  ?  Priority: Medium   ? Dyslipidemia 11/16/2015  ?  Priority: Medium   ? History of adenomatous polyp of colon 01/18/2015  ?  Priority: Medium   ? PSORIASIS 10/15/2007  ?  Priority: Medium   ? Essential hypertension 11/27/2006  ?  Priority: Medium   ? Follicular lymphoma ([place]) 04/11/2021  ? ? ?Medications- reviewed and updated ?Current Outpatient Medications  ?Medication Sig Dispense Refill  ? acetaminophen (TYLENOL) 650 MG CR tablet Take 650 mg by mouth 2 (two) times daily as needed for pain (headache).    ? amLODipine (NORVASC) 5 MG tablet Take 1 tablet by mouth once daily 90 tablet 0  ? aspirin EC 81 MG tablet Take 1 tablet (81 mg total) by mouth daily. 90 tablet 1  ? atorvastatin (LIPITOR) 10 MG tablet Take 1 tablet (10 mg total) by mouth daily. 90 tablet 3  ? carvedilol (COREG) 3.125 MG tablet Take 1 tablet by mouth twice daily 180 tablet 3  ? Cyanocobalamin (VITAMIN B-12) 5000 MCG TBDP Take 5,000 mcg by mouth every morning.    ? losartan (COZAAR) 100 MG tablet Take 1 tablet by mouth  once daily (Patient taking differently: Take 100 mg by mouth every morning.) 90 tablet 3  ? Multiple Vitamins-Minerals (OCUVITE ADULT 50+ PO)     ? nitroGLYCERIN (NITROSTAT) 0.4 MG SL tablet Place 1 tablet (0.4 mg total) under the tongue every 5 (five) minutes x 3 doses as needed for chest pain. 25 tablet 2  ? spironolactone (ALDACTONE) 25 MG tablet Take 1 tablet by mouth once daily 90 tablet 1  ? ?No current facility-administered medications for this visit.  ? ?  ?Objective:  ?BP 118/80   Pulse (!) 51   Temp (!) 97.1 ?F (36.2 ?C)   Ht '6\' 1"'$  (1.854 m)   Wt 207 lb (93.9 kg)   SpO2 97%   BMI 27.31 kg/m?  ?Gen: NAD, resting comfortably ?CV: bradycaric ?Ext: no edema ?Skin: warm, dry, 1 x 1 cm raised lesion on scalp at base of hair follicule- when squeezed expressed some purulence and blood and  part of sebaceous cyst capsule ?  ? ?Assessment and Plan  ? ? ?# spot on scalp ?S: Patient noted the spot on his scalp about 2 weeks ago after getting a haircut-feels like a bump. ?A/P: this appears to be a sebaceous cyst/epidermoid cyst- we were able to squeeze and remove a fair amount of content and decreased in size from 1 cm to 5 x 5 mm.  Monitor for now- conservative care ? ?#Hypertension ?S: Compliant with losartan 100 mg daily in the AM, spironolactone 25 mg daily in the AM, Coreg 3.125 mg twice daily, amlodipine 5 mg daily  ?BP Readings from Last 3 Encounters:  ?05/30/21 118/80  ?05/17/21 130/68  ?05/07/21 122/70  ?A/P: Controlled. Continue current medications.  ? ?# HM/recommended follow up:  upcoming colonoscopy on 29th- I will see him back in April  ?Future Appointments  ?Date Time Provider [facility]  ?06/05/2021  2:30 PM Ladene Artist, MD [facility]  ?07/04/2021 10:00 AM [name], MD [facility]  ?07/09/2021 10:00 AM [facility] None  ?07/09/2021 10:30 AM Orson Slick, MD [facility] None  ?05/30/2022 10:15 AM [facility] HEALTH COACH [facility]  ? ? ?Lab/Order associations: ?  ICD-10-CM   ?1. Essential hypertension  I10   ?  ?2. Hyperlipidemia, unspecified hyperlipidemia type  E78.5   ?  ? ?I,[name],acting as a scribe for Garret Reddish, MD.,have documented all relevant documentation on the behalf of Garret Reddish, MD,as directed by  Garret Reddish, MD while in the presence of Garret Reddish, MD. ? ?I, Garret Reddish, MD, have reviewed all documentation for this visit. The documentation on 05/30/21 for the exam, diagnosis, procedures, and orders are all accurate and complete. ? ?Return precautions advised.  ?Garret Reddish, MD ? ? ?

## 2021-05-30 ENCOUNTER — Encounter: Payer: Self-pay | Admitting: Gastroenterology

## 2021-05-30 ENCOUNTER — Ambulatory Visit: Payer: Medicare Other | Admitting: Family Medicine

## 2021-05-30 ENCOUNTER — Encounter: Payer: Self-pay | Admitting: Family Medicine

## 2021-05-30 VITALS — BP 118/80 | HR 51 | Temp 97.1°F | Ht 73.0 in | Wt 207.0 lb

## 2021-05-30 DIAGNOSIS — L723 Sebaceous cyst: Secondary | ICD-10-CM

## 2021-05-30 DIAGNOSIS — E785 Hyperlipidemia, unspecified: Secondary | ICD-10-CM

## 2021-05-30 DIAGNOSIS — I10 Essential (primary) hypertension: Secondary | ICD-10-CM

## 2021-05-30 NOTE — Patient Instructions (Addendum)
Reassuring evaluation today- sebaceous cyst ? ?Recommended follow up: Return for next already scheduled visit or sooner if needed. ?

## 2021-05-31 ENCOUNTER — Encounter: Payer: Self-pay | Admitting: Gastroenterology

## 2021-06-05 ENCOUNTER — Encounter: Payer: Medicare Other | Admitting: Gastroenterology

## 2021-06-07 DIAGNOSIS — I2510 Atherosclerotic heart disease of native coronary artery without angina pectoris: Secondary | ICD-10-CM

## 2021-06-07 DIAGNOSIS — I10 Essential (primary) hypertension: Secondary | ICD-10-CM

## 2021-06-07 DIAGNOSIS — Z9861 Coronary angioplasty status: Secondary | ICD-10-CM

## 2021-06-07 DIAGNOSIS — E785 Hyperlipidemia, unspecified: Secondary | ICD-10-CM

## 2021-06-12 DIAGNOSIS — G4733 Obstructive sleep apnea (adult) (pediatric): Secondary | ICD-10-CM | POA: Diagnosis not present

## 2021-06-15 ENCOUNTER — Other Ambulatory Visit: Payer: Self-pay | Admitting: Family Medicine

## 2021-06-27 DIAGNOSIS — G4733 Obstructive sleep apnea (adult) (pediatric): Secondary | ICD-10-CM | POA: Diagnosis not present

## 2021-07-04 ENCOUNTER — Ambulatory Visit (INDEPENDENT_AMBULATORY_CARE_PROVIDER_SITE_OTHER): Payer: Medicare Other | Admitting: Family Medicine

## 2021-07-04 ENCOUNTER — Encounter: Payer: Self-pay | Admitting: Family Medicine

## 2021-07-04 VITALS — BP 130/80 | HR 56 | Temp 98.3°F | Ht 73.0 in | Wt 206.2 lb

## 2021-07-04 DIAGNOSIS — I10 Essential (primary) hypertension: Secondary | ICD-10-CM

## 2021-07-04 DIAGNOSIS — E785 Hyperlipidemia, unspecified: Secondary | ICD-10-CM | POA: Diagnosis not present

## 2021-07-04 DIAGNOSIS — D692 Other nonthrombocytopenic purpura: Secondary | ICD-10-CM

## 2021-07-04 DIAGNOSIS — I2510 Atherosclerotic heart disease of native coronary artery without angina pectoris: Secondary | ICD-10-CM

## 2021-07-04 DIAGNOSIS — Z9861 Coronary angioplasty status: Secondary | ICD-10-CM | POA: Diagnosis not present

## 2021-07-04 DIAGNOSIS — C8203 Follicular lymphoma grade I, intra-abdominal lymph nodes: Secondary | ICD-10-CM | POA: Diagnosis not present

## 2021-07-04 DIAGNOSIS — G4733 Obstructive sleep apnea (adult) (pediatric): Secondary | ICD-10-CM | POA: Diagnosis not present

## 2021-07-04 NOTE — Patient Instructions (Addendum)
Glad you are doing well and hope for good report from CT scan!  ? ?Recommended follow up: Return in about 6 months (around 01/03/2022) for physical or sooner if needed.Schedule b4 you leave.   ?

## 2021-07-04 NOTE — Addendum Note (Signed)
Addended by: [name] on: 07/04/2021 05:19 PM ? ? Modules accepted: Level of Service ? ?

## 2021-07-06 ENCOUNTER — Other Ambulatory Visit: Payer: Self-pay | Admitting: Cardiology

## 2021-07-07 ENCOUNTER — Encounter: Payer: Self-pay | Admitting: Family Medicine

## 2021-07-08 ENCOUNTER — Telehealth: Payer: Self-pay | Admitting: *Deleted

## 2021-07-08 NOTE — Telephone Encounter (Signed)
TCT patient to reschedule his appt for tomorrow as he hasn't had his CT scan done yet even though it was ordered in February. Spoke with pt's wife as he was not at home per his wife. ?Advised that Jose Ware needs to have his scan done prior to seeing Dr. Lorenso Courier. ?Provided phone # of [facility] to get his scan scheduled. Advised that the oral contrast needed for this scan would be at the front desk here at the cancer center, along with instructions.. Advised pt's wife to call back after her husband's scan is scheduled.  She voiced understanding. She states she will let her husband know that his appts are cancelled for tomorrow. ?

## 2021-07-09 ENCOUNTER — Telehealth: Payer: Self-pay | Admitting: Hematology and Oncology

## 2021-07-09 ENCOUNTER — Other Ambulatory Visit: Payer: Medicare Other

## 2021-07-09 ENCOUNTER — Encounter: Payer: Self-pay | Admitting: *Deleted

## 2021-07-09 ENCOUNTER — Ambulatory Visit: Payer: Medicare Other | Admitting: Hematology and Oncology

## 2021-07-09 ENCOUNTER — Telehealth: Payer: Self-pay | Admitting: *Deleted

## 2021-07-09 NOTE — Telephone Encounter (Signed)
Per 5/2 in basket called pt and spoke to wife about f/u appointment  pt wife confirmed appointment  ?

## 2021-07-09 NOTE — Telephone Encounter (Signed)
Jose Ware called to inform office that Jose Ware CT scan is scheduled for May 8 at 1430.  ?Schedule message sent to schedule patient for lab and f/u appt with Dr. Lorenso Courier after May 10th to review results of CT scan. ?Sent MyChart message to inform patient that [facility] scheduler will contact him to make f/u appt. Attempted to contact patient by phone, line busy several times.  ?

## 2021-07-15 ENCOUNTER — Ambulatory Visit (HOSPITAL_COMMUNITY)
Admission: RE | Admit: 2021-07-15 | Discharge: 2021-07-15 | Disposition: A | Discharge status: Home / Self Care | Payer: Medicare Other | Source: Ambulatory Visit | Attending: Hematology and Oncology | Admitting: Hematology and Oncology

## 2021-07-15 ENCOUNTER — Encounter (HOSPITAL_COMMUNITY): Payer: Self-pay

## 2021-07-15 DIAGNOSIS — R590 Localized enlarged lymph nodes: Secondary | ICD-10-CM | POA: Diagnosis not present

## 2021-07-15 DIAGNOSIS — R918 Other nonspecific abnormal finding of lung field: Secondary | ICD-10-CM | POA: Diagnosis not present

## 2021-07-15 DIAGNOSIS — C8293 Follicular lymphoma, unspecified, intra-abdominal lymph nodes: Secondary | ICD-10-CM | POA: Diagnosis not present

## 2021-07-15 DIAGNOSIS — K5732 Diverticulitis of large intestine without perforation or abscess without bleeding: Secondary | ICD-10-CM | POA: Diagnosis not present

## 2021-07-15 LAB — POCT I-STAT CREATININE: Creatinine, Ser: 1.5 mg/dL — ABNORMAL HIGH (ref 0.61–1.24)

## 2021-07-15 MED ORDER — SODIUM CHLORIDE (PF) 0.9 % IJ SOLN
INTRAMUSCULAR | Status: AC
  Filled 2021-07-15: qty 50

## 2021-07-15 MED ORDER — IOHEXOL 300 MG/ML  SOLN
100.0000 mL | Freq: Once | INTRAMUSCULAR | Status: AC | PRN
  Administered 2021-07-15: 75 mL via INTRAVENOUS

## 2021-07-19 ENCOUNTER — Other Ambulatory Visit: Payer: Self-pay

## 2021-07-19 ENCOUNTER — Inpatient Hospital Stay: Payer: Medicare Other | Attending: Hematology and Oncology | Admitting: Hematology and Oncology

## 2021-07-19 ENCOUNTER — Inpatient Hospital Stay: Payer: Medicare Other

## 2021-07-19 VITALS — BP 124/69 | HR 52 | Temp 97.5°F | Resp 18 | Wt 203.1 lb

## 2021-07-19 DIAGNOSIS — C8293 Follicular lymphoma, unspecified, intra-abdominal lymph nodes: Secondary | ICD-10-CM

## 2021-07-19 DIAGNOSIS — R590 Localized enlarged lymph nodes: Secondary | ICD-10-CM

## 2021-07-19 DIAGNOSIS — C8213 Follicular lymphoma grade II, intra-abdominal lymph nodes: Secondary | ICD-10-CM | POA: Diagnosis not present

## 2021-07-19 DIAGNOSIS — Z807 Family history of other malignant neoplasms of lymphoid, hematopoietic and related tissues: Secondary | ICD-10-CM | POA: Diagnosis not present

## 2021-07-19 DIAGNOSIS — Z803 Family history of malignant neoplasm of breast: Secondary | ICD-10-CM | POA: Insufficient documentation

## 2021-07-19 DIAGNOSIS — Z87891 Personal history of nicotine dependence: Secondary | ICD-10-CM | POA: Insufficient documentation

## 2021-07-19 DIAGNOSIS — I700 Atherosclerosis of aorta: Secondary | ICD-10-CM | POA: Diagnosis not present

## 2021-07-19 DIAGNOSIS — I10 Essential (primary) hypertension: Secondary | ICD-10-CM | POA: Diagnosis not present

## 2021-07-19 LAB — CBC WITH DIFFERENTIAL/PLATELET
Abs Immature Granulocytes: 0.02 10*3/uL (ref 0.00–0.07)
Basophils Absolute: 0.1 10*3/uL (ref 0.0–0.1)
Basophils Relative: 1 %
Eosinophils Absolute: 0.2 10*3/uL (ref 0.0–0.5)
Eosinophils Relative: 3 %
HCT: 38.4 % — ABNORMAL LOW (ref 39.0–52.0)
Hemoglobin: 12.6 g/dL — ABNORMAL LOW (ref 13.0–17.0)
Immature Granulocytes: 0 %
Lymphocytes Relative: 17 %
Lymphs Abs: 1.4 10*3/uL (ref 0.7–4.0)
MCH: 32.1 pg (ref 26.0–34.0)
MCHC: 32.8 g/dL (ref 30.0–36.0)
MCV: 98 fL (ref 80.0–100.0)
Monocytes Absolute: 1 10*3/uL (ref 0.1–1.0)
Monocytes Relative: 13 %
Neutro Abs: 5.4 10*3/uL (ref 1.7–7.7)
Neutrophils Relative %: 66 %
Platelets: 228 10*3/uL (ref 150–400)
RBC: 3.92 MIL/uL — ABNORMAL LOW (ref 4.22–5.81)
RDW: 14.1 % (ref 11.5–15.5)
WBC: 8.1 10*3/uL (ref 4.0–10.5)
nRBC: 0 % (ref 0.0–0.2)

## 2021-07-19 LAB — COMPREHENSIVE METABOLIC PANEL
ALT: 7 U/L (ref 0–44)
AST: 10 U/L — ABNORMAL LOW (ref 15–41)
Albumin: 4 g/dL (ref 3.5–5.0)
Alkaline Phosphatase: 68 U/L (ref 38–126)
Anion gap: 8 (ref 5–15)
BUN: 32 mg/dL — ABNORMAL HIGH (ref 8–23)
CO2: 29 mmol/L (ref 22–32)
Calcium: 9.3 mg/dL (ref 8.9–10.3)
Chloride: 103 mmol/L (ref 98–111)
Creatinine, Ser: 1.33 mg/dL — ABNORMAL HIGH (ref 0.61–1.24)
GFR, Estimated: 52 mL/min — ABNORMAL LOW (ref >60)
Glucose, Bld: 86 mg/dL (ref 70–99)
Potassium: 4.4 mmol/L (ref 3.5–5.1)
Sodium: 140 mmol/L (ref 135–145)
Total Bilirubin: 1.2 mg/dL (ref 0.3–1.2)
Total Protein: 7 g/dL (ref 6.5–8.1)

## 2021-07-19 NOTE — Progress Notes (Signed)
?[facility] ?[phone]   [phone] ? ?PROGRESS NOTE ? ?Patient Care Team: ?[name], MD as PCP - General (Family Medicine) ?Martinique, Peter M, MD as PCP - Cardiology (Cardiology) ?Lowella Bandy, MD (Inactive) (Urology) ?Ladene Artist, MD (Gastroenterology) ?Martinique, Peter M, MD as Consulting Physician (Cardiology) ?Keene Breath., MD as Consulting Physician (Ophthalmology) ?Festus Aloe, MD as Consulting Physician (Urology) ?Edythe Clarity, [facility] as Pharmacist (Pharmacist) ? ?Hematological/Oncological History ?# Follicular Lymphoma Stage I-II ?12/04/2020: CT C/A/P shows periaortic adenopathy in the upper abdomen. No evidence of disease in the chest ?12/17/2020: biopsy of lymph node shows result consistent with follicular lymphoma. ?01/09/2021: transition care to Dr. Lorenso Courier  ?07/17/2021: CT C/A/P showed slight progression of disease as ?demonstrated by slight interval enlargement of several ?retroperitoneal lymph nodes and a mildly enlarged low middle ?mediastinal lymph node. ? ?Interval History:  ?Jose Ware 87 y.o. male with medical history significant for newly diagnosed early stage follicular lymphoma who presents for a follow up visit. The patient's last visit was on 04/11/2021 . In the interim since the last visit he has had no changes in his health.  ? ?On exam today Jose Ware is accompanied by his wife.  He reports that he is "still here".  He notes that his energy has been "off-and-on".  He notes he does get tired a lot and that is his only symptom.  He reports his appetite is good and his weight has been steady.  He has not noticed any new bumps or lumps.  He has not started any new medications.  He is not having any cough but does not endorse having some sneezing during his allergy season.  He notes that he otherwise is not having any fevers, chills, sweats, nausea, vomiting or diarrhea.  Full 10 point ROS is listed below. ? ?MEDICAL HISTORY:   ?Past Medical History:  ?Diagnosis Date  ? Adenocarcinoma of prostate ([place])   ? XRT & Radiation seed implantation in 2010  ? Adenomatous colon polyp   ? CAD (coronary artery disease)   ? a. 11/04/15:  Acute inferolateral STEMI: S/p emergent DES of a very large LCx with extensive thrombus. Significant residual disease in the proximal LAD and distal RCA. S/p staged PCI of LAD and RCA 8/29.  ? Cataract   ? CVA (cerebral infarction)   ? a. 08/735: embolic CVA after heart cath   ? CVA (cerebral infarction)   ? a. 03/624: embolic CVA after heart cath   ? DIVERTICULITIS, HX OF 11/27/2006  ?     ? DJD (degenerative joint disease)   ? wrist - R   ? Hernia   ? History of blood transfusion 1985  ? with colon surgery  ? Hypertension   ? Ischemic cardiomyopathy   ? a. EF 25-30% by LV gram on cath 11/04/15. Appeared out of proportion to infarct although infarct was large. EF improved by Echo and now 40-45%.  ? Myocardial infarction [facility])   ? NEPHROLITHIASIS, HX OF 11/27/2006  ?      ? Peripheral neuropathy   ? Psoriasis   ? Tenosynovitis   ? ? ?SURGICAL HISTORY: ?Past Surgical History:  ?Procedure Laterality Date  ? APPENDECTOMY  1985  ? [place]  ? minor disk surgery: instrumentation placed and removed in a second procedure  ? CARDIAC CATHETERIZATION N/A 11/04/2015  ? Procedure: Left Heart Cath and Coronary Angiography;  Surgeon: Peter M Martinique, MD;  Location: [facility];  Service: Cardiovascular;  Laterality: N/A;  ? CARDIAC CATHETERIZATION N/A 11/04/2015  ? Procedure: Coronary Stent Intervention;  Surgeon: Peter M Martinique, MD;  Location: [facility];  Service: Cardiovascular;  Laterality: N/A;  ? CARDIAC CATHETERIZATION N/A 11/06/2015  ? Procedure: Coronary Stent Intervention;  Surgeon: Leonie Man, MD;  Location: [facility];  Service: Cardiovascular;  Laterality: N/A;  ? CATARACT EXTRACTION, BILATERAL  2013  ? CHOLECYSTECTOMY  1986  ? COLON SURGERY  1980's  ? pt. had ileus, had colon surgery,  requiring colostomy & then reversal & then dehisence of that wound & return to OR for repair & cholecystectomy    ? COLONOSCOPY    ? COLOSTOMY  1985  ? After colectomy for diverticulitis, pt. remarks during this surgery they "gave me the paddles two times  because of bleeding", pt. states it was unrelated to any anesthesia complication  ? EYE SURGERY    ? /w IOL  ? KNEE ARTHROSCOPY Left 2003  ? [place]  ? Left 1956, Right w/cartilage removed later  ? PARATHYROIDECTOMY N/A 05/12/2014  ? Procedure: PARATHYROIDECTOMY;  Surgeon: Armandina Gemma, MD;  Location: [facility];  Service: General;  Laterality: N/A;  ? REVERSAL OF COLOSTOMY  1987  ? TONSILLECTOMY  1946  ? WRIST SURGERY Left   ? Remote complicated w/infection  ? ? ?SOCIAL HISTORY: ?Social History  ? ?Socioeconomic History  ? Marital status: Married  ?  Spouse name: Not on file  ? Number of children: 3  ? Years of education: Not on file  ? Highest education level: Not on file  ?Occupational History  ? Not on file  ?Tobacco Use  ? Smoking status: Former  ?  Packs/day: 2.00  ?  Years: 37.00  ?  Pack years: 74.00  ?  Types: Cigarettes  ?  Quit date: 03/11/1983  ?  Years since quitting: 38.3  ? Smokeless tobacco: Never  ? Tobacco comments:  ?  started smoking in the service; ICU 85' part of his stomach; appendix; coma   ?Vaping Use  ? Vaping Use: Never used  ?Substance and Sexual Activity  ? Alcohol use: Yes  ?  Alcohol/week: 0.0 standard drinks  ?  Comment: RARE  ? Drug use: No  ? Sexual activity: Not on file  ?Other Topics Concern  ? Not on file  ?Social History Narrative  ? [place].Married 61 - Divorced 1996; remarried '03 different wife3 sons - '63, '65, '67Grandchildren -14; 1 great grand daughter; 4 great grands on wife's side, 1 great great granddaughter  ?   ? 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   ? Hobbies: fishing, busy volunteering  ? ?Social Determinants of Health  ? ?Financial Resource Strain: Low  Risk   ? Difficulty of Paying Living Expenses: Not hard at all  ?Food Insecurity: No Food Insecurity  ? Worried About Charity fundraiser in the Last Year: Never true  ? Ran Out of Food in the Last Year: Never true  ?Transportation Needs: No Transportation Needs  ? Lack of Transportation (Medical): No  ? Lack of Transportation (Non-Medical): No  ?Physical Activity: Sufficiently Active  ? Days of Exercise per Week: 3 days  ? Minutes of Exercise per Session: 60 min  ?Stress: No Stress Concern Present  ? Feeling of Stress : Not at all  ?Social Connections: Socially Integrated  ? Frequency of Communication with Friends and Family: Once a  week  ? Frequency of Social Gatherings with Friends and Family: More than three times a week  ? Attends Religious Services: More than 4 times per year  ? Active Member of Clubs or Organizations: Yes  ? Attends Archivist Meetings: 1 to 4 times per year  ? Marital Status: Married  ?Intimate Partner Violence: Not At Risk  ? Fear of Current or Ex-Partner: No  ? Emotionally Abused: No  ? Physically Abused: No  ? Sexually Abused: No  ? ? ?FAMILY HISTORY: ?Family History  ?Problem Relation Age of Onset  ? Coronary artery disease Mother   ? Alcohol abuse Father   ? Cirrhosis Father   ? Heart failure Sister   ? Breast cancer Sister   ?     W/involvement of right arm leading to amputation  ? Anemia Sister   ?     related to treatment to lymphoma  ? Non-Hodgkin's lymphoma Sister   ? Other Sister   ?     died from covid 06-07-2020  ? Coronary artery disease Brother   ? Heart attack Brother   ? Non-Hodgkin's lymphoma Brother   ? Clotting disorder Brother   ? Non-Hodgkin's lymphoma Brother   ? Prostate cancer Neg Hx   ? Colon cancer Neg Hx   ? Diabetes Neg Hx   ? Glaucoma Neg Hx   ? Rectal cancer Neg Hx   ? Esophageal cancer Neg Hx   ? ? ?ALLERGIES:  is allergic to bee venom, betadine [povidone iodine], and doxycycline. ? ?MEDICATIONS:  ?Current Outpatient Medications  ?Medication Sig Dispense  Refill  ? acetaminophen (TYLENOL) 650 MG CR tablet Take 650 mg by mouth 2 (two) times daily as needed for pain (headache).    ? amLODipine (NORVASC) 5 MG tablet Take 1 tablet by mouth once daily 90 tablet 0  ? a

## 2021-07-20 ENCOUNTER — Other Ambulatory Visit: Payer: Self-pay | Admitting: Cardiology

## 2021-07-22 ENCOUNTER — Encounter: Payer: Self-pay | Admitting: Cardiology

## 2021-07-22 ENCOUNTER — Telehealth: Payer: Self-pay

## 2021-07-22 NOTE — Telephone Encounter (Signed)
Spoke to patient advised your last appointment with Dr.Jordan 04/15/21.Advised your next appointment in 1 year. ?

## 2021-07-22 NOTE — Telephone Encounter (Signed)
Patient's wife called asking for clarification regarding  why apt was scheduled for ecco cardiogram.  ?

## 2021-07-27 DIAGNOSIS — G4733 Obstructive sleep apnea (adult) (pediatric): Secondary | ICD-10-CM | POA: Diagnosis not present

## 2021-07-29 ENCOUNTER — Ambulatory Visit (HOSPITAL_COMMUNITY)
Admission: RE | Admit: 2021-07-29 | Discharge: 2021-07-29 | Disposition: A | Discharge status: Home / Self Care | Payer: Medicare Other | Source: Ambulatory Visit | Attending: Hematology and Oncology | Admitting: Hematology and Oncology

## 2021-07-29 DIAGNOSIS — I700 Atherosclerosis of aorta: Secondary | ICD-10-CM | POA: Insufficient documentation

## 2021-07-29 DIAGNOSIS — E785 Hyperlipidemia, unspecified: Secondary | ICD-10-CM | POA: Insufficient documentation

## 2021-07-29 DIAGNOSIS — I2510 Atherosclerotic heart disease of native coronary artery without angina pectoris: Secondary | ICD-10-CM | POA: Diagnosis not present

## 2021-07-29 DIAGNOSIS — I359 Nonrheumatic aortic valve disorder, unspecified: Secondary | ICD-10-CM | POA: Diagnosis not present

## 2021-07-29 DIAGNOSIS — Z8673 Personal history of transient ischemic attack (TIA), and cerebral infarction without residual deficits: Secondary | ICD-10-CM | POA: Insufficient documentation

## 2021-07-29 DIAGNOSIS — I252 Old myocardial infarction: Secondary | ICD-10-CM | POA: Diagnosis not present

## 2021-07-29 DIAGNOSIS — I10 Essential (primary) hypertension: Secondary | ICD-10-CM | POA: Insufficient documentation

## 2021-07-29 LAB — ECHOCARDIOGRAM COMPLETE
AR max vel: 2.54 cm2
AV Area VTI: 2.7 cm2
AV Area mean vel: 2.63 cm2
AV Mean grad: 7.8 mmHg
AV Peak grad: 15.2 mmHg
Ao pk vel: 1.95 m/s
Area-P 1/2: 2.2 cm2
Calc EF: 52.4 %
MV M vel: 3.96 m/s
MV Peak grad: 62.7 mmHg
MV VTI: 2.61 cm2
S' Lateral: 4.4 cm
Single Plane A2C EF: 47 %
Single Plane A4C EF: 57.9 %

## 2021-08-27 DIAGNOSIS — G4733 Obstructive sleep apnea (adult) (pediatric): Secondary | ICD-10-CM | POA: Diagnosis not present

## 2021-09-14 ENCOUNTER — Other Ambulatory Visit: Payer: Self-pay | Admitting: Family Medicine

## 2021-09-30 DIAGNOSIS — H35423 Microcystoid degeneration of retina, bilateral: Secondary | ICD-10-CM | POA: Diagnosis not present

## 2021-09-30 DIAGNOSIS — H35032 Hypertensive retinopathy, left eye: Secondary | ICD-10-CM | POA: Diagnosis not present

## 2021-09-30 DIAGNOSIS — H35372 Puckering of macula, left eye: Secondary | ICD-10-CM | POA: Diagnosis not present

## 2021-09-30 DIAGNOSIS — H353132 Nonexudative age-related macular degeneration, bilateral, intermediate dry stage: Secondary | ICD-10-CM | POA: Diagnosis not present

## 2021-10-31 DIAGNOSIS — H43813 Vitreous degeneration, bilateral: Secondary | ICD-10-CM | POA: Diagnosis not present

## 2021-10-31 DIAGNOSIS — H35423 Microcystoid degeneration of retina, bilateral: Secondary | ICD-10-CM | POA: Diagnosis not present

## 2021-10-31 DIAGNOSIS — H353112 Nonexudative age-related macular degeneration, right eye, intermediate dry stage: Secondary | ICD-10-CM | POA: Diagnosis not present

## 2021-10-31 DIAGNOSIS — H353124 Nonexudative age-related macular degeneration, left eye, advanced atrophic with subfoveal involvement: Secondary | ICD-10-CM | POA: Diagnosis not present

## 2021-10-31 DIAGNOSIS — H35372 Puckering of macula, left eye: Secondary | ICD-10-CM | POA: Diagnosis not present

## 2021-11-12 ENCOUNTER — Encounter: Payer: Self-pay | Admitting: Hematology and Oncology

## 2021-11-14 ENCOUNTER — Encounter: Payer: Self-pay | Admitting: *Deleted

## 2021-11-23 ENCOUNTER — Other Ambulatory Visit: Payer: Self-pay | Admitting: Cardiology

## 2021-12-02 ENCOUNTER — Encounter: Payer: Self-pay | Admitting: *Deleted

## 2021-12-05 ENCOUNTER — Other Ambulatory Visit: Payer: Self-pay | Admitting: Family Medicine

## 2021-12-09 DIAGNOSIS — G4733 Obstructive sleep apnea (adult) (pediatric): Secondary | ICD-10-CM | POA: Diagnosis not present

## 2021-12-28 ENCOUNTER — Other Ambulatory Visit: Payer: Self-pay | Admitting: Cardiology

## 2022-01-09 ENCOUNTER — Ambulatory Visit (INDEPENDENT_AMBULATORY_CARE_PROVIDER_SITE_OTHER): Payer: Medicare Other | Admitting: Family Medicine

## 2022-01-09 ENCOUNTER — Encounter: Payer: Self-pay | Admitting: Family Medicine

## 2022-01-09 ENCOUNTER — Telehealth: Payer: Self-pay | Admitting: *Deleted

## 2022-01-09 VITALS — BP 132/62 | HR 60 | Temp 97.7°F | Ht 73.0 in | Wt 213.4 lb

## 2022-01-09 DIAGNOSIS — Z8546 Personal history of malignant neoplasm of prostate: Secondary | ICD-10-CM

## 2022-01-09 DIAGNOSIS — E538 Deficiency of other specified B group vitamins: Secondary | ICD-10-CM | POA: Diagnosis not present

## 2022-01-09 DIAGNOSIS — Z1283 Encounter for screening for malignant neoplasm of skin: Secondary | ICD-10-CM | POA: Diagnosis not present

## 2022-01-09 DIAGNOSIS — E785 Hyperlipidemia, unspecified: Secondary | ICD-10-CM

## 2022-01-09 DIAGNOSIS — Z0000 Encounter for general adult medical examination without abnormal findings: Secondary | ICD-10-CM

## 2022-01-09 LAB — CBC WITH DIFFERENTIAL/PLATELET
Basophils Absolute: 0 10*3/uL (ref 0.0–0.1)
Basophils Relative: 0.4 % (ref 0.0–3.0)
Eosinophils Absolute: 0.1 10*3/uL (ref 0.0–0.7)
Eosinophils Relative: 1.6 % (ref 0.0–5.0)
HCT: 34.9 % — ABNORMAL LOW (ref 39.0–52.0)
Hemoglobin: 11.5 g/dL — ABNORMAL LOW (ref 13.0–17.0)
Lymphocytes Relative: 12.5 % (ref 12.0–46.0)
Lymphs Abs: 1 10*3/uL (ref 0.7–4.0)
MCHC: 33 g/dL (ref 30.0–36.0)
MCV: 98.3 fl (ref 78.0–100.0)
Monocytes Absolute: 1 10*3/uL (ref 0.1–1.0)
Monocytes Relative: 12.6 % — ABNORMAL HIGH (ref 3.0–12.0)
Neutro Abs: 6 10*3/uL (ref 1.4–7.7)
Neutrophils Relative %: 72.9 % (ref 43.0–77.0)
Platelets: 208 10*3/uL (ref 150.0–400.0)
RBC: 3.56 Mil/uL — ABNORMAL LOW (ref 4.22–5.81)
RDW: 14 % (ref 11.5–15.5)
WBC: 8.3 10*3/uL (ref 4.0–10.5)

## 2022-01-09 LAB — COMPREHENSIVE METABOLIC PANEL
ALT: 6 U/L (ref 0–53)
AST: 10 U/L (ref 0–37)
Albumin: 3.6 g/dL (ref 3.5–5.2)
Alkaline Phosphatase: 64 U/L (ref 39–117)
BUN: 33 mg/dL — ABNORMAL HIGH (ref 6–23)
CO2: 29 mEq/L (ref 19–32)
Calcium: 8.7 mg/dL (ref 8.4–10.5)
Chloride: 106 mEq/L (ref 96–112)
Creatinine, Ser: 1.01 mg/dL (ref 0.40–1.50)
GFR: 67.76 mL/min (ref >60.00)
Glucose, Bld: 88 mg/dL (ref 70–99)
Potassium: 4.1 mEq/L (ref 3.5–5.1)
Sodium: 143 mEq/L (ref 135–145)
Total Bilirubin: 0.9 mg/dL (ref 0.2–1.2)
Total Protein: 5.9 g/dL — ABNORMAL LOW (ref 6.0–8.3)

## 2022-01-09 LAB — LIPID PANEL
Cholesterol: 74 mg/dL (ref 0–200)
HDL: 36 mg/dL — ABNORMAL LOW (ref >39.00)
LDL Cholesterol: 23 mg/dL (ref 0–99)
NonHDL: 38.4
Total CHOL/HDL Ratio: 2
Triglycerides: 77 mg/dL (ref 0.0–149.0)
VLDL: 15.4 mg/dL (ref 0.0–40.0)

## 2022-01-09 LAB — VITAMIN B12: Vitamin B-12: >1500 pg/mL — ABNORMAL HIGH (ref 211–911)

## 2022-01-09 LAB — PSA: PSA: 0 ng/mL — ABNORMAL LOW (ref 0.10–4.00)

## 2022-01-09 NOTE — Progress Notes (Signed)
Phone: [phone]   Subjective:  Patient presents today for their annual physical. Chief complaint-noted.   See problem oriented charting- ROS- full  review of systems was completed and negative  except for: looser stools- no recent change, urinary urgency and frequency- similar to last year- more common when drinks a lot of water, joint pain in knee, back pain after knee bothering him, bruises easily on asa  The following were reviewed and entered/updated in epic: Past Medical History:  Diagnosis Date   Adenocarcinoma of prostate ([place])    XRT & Radiation seed implantation in 2010   Adenomatous colon polyp    CAD (coronary artery disease)    a. 11/04/15:  Acute inferolateral STEMI: S/p emergent DES of a very large LCx with extensive thrombus. Significant residual disease in the proximal LAD and distal RCA. S/p staged PCI of LAD and RCA 8/29.   Cataract    CVA (cerebral infarction)    a. 08/735: embolic CVA after heart cath    CVA (cerebral infarction)    a. 03/624: embolic CVA after heart cath    DIVERTICULITIS, HX OF 11/27/2006        DJD (degenerative joint disease)    wrist - R    Hernia    History of blood transfusion 1985   with colon surgery   Hypertension    Ischemic cardiomyopathy    a. EF 25-30% by LV gram on cath 11/04/15. Appeared out of proportion to infarct although infarct was large. EF improved by Echo and now 40-45%.   Myocardial infarction ([place])    NEPHROLITHIASIS, HX OF 11/27/2006         Peripheral neuropathy    Psoriasis    Tenosynovitis    Patient Active Problem List   Diagnosis Date Noted   Follicular lymphoma ([place]) 04/11/2021    Priority: High   History of small bowel obstruction 11/13/2020    Priority: High   CAD S/P PCI     Priority: High   Ischemic cardiomyopathy     Priority: High   History of embolic stroke     Priority: High   History of primary hyperparathyroidism s/p parathyroidectomy (benign adenoma) 12/28/2013    Priority: High    Fatigue 12/01/2013    Priority: High   History of prostate cancer 12/14/2008    Priority: High   Dyslipidemia 11/16/2015    Priority: Medium    B12 deficiency 09/17/2010    Priority: Medium    Onychomycosis 10/28/2017    Priority: Low   Drug reaction 02/05/2017    Priority: Low   Senile purpura ([place]) 02/05/2017    Priority: Low   History of ventricular tachycardia 11/08/2015    Priority: Low   History of stomach cancer 12/01/2013    Priority: Low   Lower back pain 03/24/2013    Priority: Low   Bradycardia 09/23/2012    Priority: Low   PERIPHERAL NEUROPATHY 10/18/2007    Priority: [place] DISEASE 11/27/2006    Priority: Low   NEPHROLITHIASIS, HX OF 11/27/2006    Priority: Low   Pain of back and left lower extremity 06/08/2019    Priority: 1.   Left knee pain 06/06/2019    Priority: 1.   Spondylosis 06/25/2017    Priority: 1.   Post-traumatic osteoarthritis of right wrist 04/25/2015    Priority: 1.   OSA (obstructive sleep apnea) 05/08/2020    Priority: Medium    Post herpetic neuralgia 06/20/2017    Priority: Medium  Spinal stenosis of lumbar region at multiple levels 04/30/2017    Priority: Medium    History of adenomatous polyp of colon 01/18/2015    Priority: Medium    PSORIASIS 10/15/2007    Priority: Medium    Essential hypertension 11/27/2006    Priority: Medium    Past Surgical History:  Procedure Laterality Date   [place]   minor disk surgery: instrumentation placed and removed in a second procedure   CARDIAC CATHETERIZATION N/A 11/04/2015   Procedure: Left Heart Cath and Coronary Angiography;  Surgeon: Peter M Martinique, MD;  Location: [facility];  Service: Cardiovascular;  Laterality: N/A;   CARDIAC CATHETERIZATION N/A 11/04/2015   Procedure: Coronary Stent Intervention;  Surgeon: Peter M Martinique, MD;  Location: [facility];  Service: Cardiovascular;  Laterality: N/A;   CARDIAC  CATHETERIZATION N/A 11/06/2015   Procedure: Coronary Stent Intervention;  Surgeon: Leonie Man, MD;  Location: [facility];  Service: Cardiovascular;  Laterality: N/A;   CATARACT EXTRACTION, BILATERAL  06-14-11   CHOLECYSTECTOMY  1986   COLON SURGERY  1980's   pt. had ileus, had colon surgery, requiring colostomy & then reversal & then dehisence of that wound & return to OR for repair & cholecystectomy     COLONOSCOPY     COLOSTOMY  1985   After colectomy for diverticulitis, pt. remarks during this surgery they "gave me the paddles two times  because of bleeding", pt. states it was unrelated to any anesthesia complication   EYE SURGERY     /w IOL   KNEE ARTHROSCOPY Left June 13, 2001   [place], Right w/cartilage removed later   PARATHYROIDECTOMY N/A 05/12/2014   Procedure: PARATHYROIDECTOMY;  Surgeon: Armandina Gemma, MD;  Location: [facility];  Service: General;  Laterality: N/A;   REVERSAL OF COLOSTOMY  1987   TONSILLECTOMY  1946   WRIST SURGERY Left    Remote complicated w/infection    Family History  Problem Relation Age of Onset   Coronary artery disease Mother    Alcohol abuse Father    Cirrhosis Father    Heart failure Sister    Breast cancer Sister        W/involvement of right arm leading to amputation   Anemia Sister        related to treatment to lymphoma   Non-Hodgkin's lymphoma Sister    Other Sister        died from covid 06/13/2020   Coronary artery disease Brother    Heart attack Brother    Non-Hodgkin's lymphoma Brother    Clotting disorder Brother    Non-Hodgkin's lymphoma Brother    Prostate cancer Neg Hx    Colon cancer Neg Hx    Diabetes Neg Hx    Glaucoma Neg Hx    Rectal cancer Neg Hx    Esophageal cancer Neg Hx     Medications- reviewed and updated Current Outpatient Medications  Medication Sig Dispense Refill   acetaminophen (TYLENOL) 650 MG CR tablet Take 650 mg by mouth 2 (two) times daily as needed for pain (headache).     amLODipine  (NORVASC) 5 MG tablet Take 1 tablet by mouth once daily 90 tablet 0   aspirin EC 81 MG tablet Take 1 tablet (81 mg total) by mouth daily. 90 tablet 1   atorvastatin (LIPITOR) 10 MG tablet Take 1 tablet (10 mg total) by mouth daily. (Patient taking differently:  Take 10 mg by mouth every other day.) 90 tablet 3   carvedilol (COREG) 3.125 MG tablet Take 1 tablet by mouth twice daily 180 tablet 0   Cyanocobalamin (VITAMIN B-12) 5000 MCG TBDP Take 5,000 mcg by mouth every morning.     losartan (COZAAR) 100 MG tablet Take 1 tablet by mouth once daily 90 tablet 3   Multiple Vitamins-Minerals (OCUVITE ADULT 50+ PO)      nitroGLYCERIN (NITROSTAT) 0.4 MG SL tablet Place 1 tablet (0.4 mg total) under the tongue every 5 (five) minutes x 3 doses as needed for chest pain. 25 tablet 2   spironolactone (ALDACTONE) 25 MG tablet Take 1 tablet by mouth once daily 90 tablet 0   No current facility-administered medications for this visit.    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 Oropharynx normal Neck: no thyromegaly CV: RRR no murmurs rubs or gallops Lungs: CTAB no crackles,  wheeze, rhonchi Abdomen: soft/nontender/nondistended/normal bowel sounds. No rebound or guarding.  Ext: no edema Skin: warm, dry Neuro: grossly normal, moves all extremities, PERRLA, uses high walking stick for balance    Assessment and Plan  87 y.o. male presenting for annual physical.  Health Maintenance counseling: 1. Anticipatory guidance: Patient counseled regarding regular dental exams -q6 months, eye exams -yearly,  avoiding smoking and second hand smoke , limiting alcohol to 2 beverages per day - doesn't drink, no illicit drugs .   2. Risk factor reduction:  Advised patient of need for regular exercise and diet rich and fruits and vegetables to reduce risk of heart attack and stroke.  Exercise- 2-3 x a week despite knee and back issues.  Diet/weight management-up 7 lbs from last visit- thinks loosend on diet but can improve- plus just had trip to [place].  Wt Readings from Last 3 Encounters:  01/09/22 213 lb 6.4 oz (96.8 kg)  07/19/21 203 lb 2 oz (92.1 kg)  07/04/21 206 lb 3.2 oz (93.5 kg)  3. Immunizations/screenings/ancillary studies- up to date  Immunization History  Administered Date(s) Administered   Fluad Quad(high Dose 65+) 12/17/2018, 12/23/2019, 12/25/2020, 12/02/2021   Influenza Split 01/06/2012   Influenza Whole 12/19/2008, 12/27/2009   Influenza, High Dose Seasonal PF 12/21/2012, 12/22/2014, 02/05/2017, 12/08/2017   Influenza,inj,Quad PF,6+ Mos 12/01/2013, 10/24/2015   PFIZER(Purple Top)SARS-COV-2 Vaccination 04/07/2019, 04/21/2019, 12/08/2019, 07/14/2020   Pfizer Covid-19 Vaccine Bivalent Booster 53yr & up 12/29/2020, 12/02/2021   Pneumococcal Conjugate-13 09/14/2015   Pneumococcal Polysaccharide-23 02/12/2009   Respiratory Syncytial Virus Vaccine,Recomb Aduvanted(Arexvy) 12/02/2021   Td 03/12/2003  Tdap 02/27/2014   Zoster Recombinat (Shingrix) 11/24/2017, 01/26/2018  4. Prostate cancer follow up- we will check PSAs.  Still sees Dr. Junious Silk after prior  prostate cancer-PSA has remained well controlled Lab Results  Component Value Date   PSA 0.00 (L) 12/27/2020   PSA <0.04 12/23/2019   PSA 0.00 (L) 12/17/2018   5. Colon cancer screening - plans to call GI and schedule follow up 6. Skin cancer screening-last year wanted to hold off-had previously seen Dr. Collier Bullock year ok with referral. advised regular sunscreen use. Denies worrisome, changing, or new skin lesions.  7. Smoking associated screening (lung cancer screening, AAA screen 65-75, UA)-former smoker-quit in 1980s 8. STD screening -declines as monogamous  Status of chronic or acute concerns   #Non hodgkins lymphoma/follicular lymphoma- diagnosed 2022- monitoring with Dr. Lorenso Courier with last visit in May and sees on the 13th of November- can only scan twice a year though  #Hypertension S: Compliant with losartan 100 mg, spironolactone 25 mg, Coreg 3.125 mg twice daily, amlodipine 5 mg.   BP Readings from Last 3 Encounters:  01/09/22 132/62  07/19/21 124/69  07/04/21 130/80   A/P: home readings in 120s- just above that today- reasonable control- continue current medicine   #Hyperlipidemia/CAD status post PCI after STEMI August 2017 S: Patient is compliant with atorvastatin 10 mg every day(prior 40 mg--> reduce to 20 mg October 2021 with LDL at 25, later further reduction) . -He is also compliant with aspirin '81mg'$ - prior was on Plavix as well but had easy bruising/bleeding  -no chest pain or shortness of breath Lab Results  Component Value Date   CHOL 66 12/27/2020   HDL 31.00 (L) 12/27/2020   LDLCALC 22 12/27/2020   LDLDIRECT 33.0 06/25/2020   TRIG 63.0 12/27/2020   CHOLHDL 2 12/27/2020   A/P: Hopefully stable lipids despite reduced dose-update lipid panel today and for now continue current medication CAD asymptomatic-continue current medication   #OSA on CPAP- started sept 2022 follows with Dr. Halford Chessman   # B12 deficiency S: Current treatment/medication (oral vs. IM): otc  supplement  Lab Results  Component Value Date   VITAMINB12 >1550 (H) 12/27/2020   A/P: suspect stable- update b12    Other notes: History primary hyperparathyroidism- treated with parathyroid surgery but will continue to monitor calcium levels- check today  #Echo findings 07/29/21-  6. Aortic dilatation noted. There is borderline dilatation of the  ascending aorta and of the aortic root, measuring 38 mm. There is mild  dilatation of the aortic root and of the ascending aorta, measuring 39 mm.  Aortic Valve: AV is thickened, calcified with very mildly restricted  motion.  -consider repeat in future echo  #Left knee pain- prior surgery in 1960s had meniscus removed- popping some in last 3 weeks and more pain- causing some back issues as well. Got a new brace and working through some old PT exercises. Some arthritis noted in 2005. Has tried tylenol with minimal relief. Aleve works well but is worried about bleeding risk -recommended trial of voltaren gel 4x a day for at least 1 week or 2 weeks and if not improving can refer to sports medicine   Recommended follow up: Return in about 6 months (around 07/10/2022) for followup or sooner if needed.Schedule b4 you leave. Future Appointments  Date Time Provider [place]  01/20/2022 10:30 AM [facility] None  01/20/2022 11:00 AM Orson Slick, MD [facility] None  04/14/2022  1:20 PM Martinique, Peter M, MD [facility] None  05/30/2022 10:15 AM [facility] HEALTH COACH [facility]   Lab/Order associations: NOT fasting- coffee and small donut   ICD-10-CM   1. Preventative health care  Z00.00     2. Dyslipidemia  E78.5 CBC with Differential/Platelet    Comprehensive metabolic panel    Lipid panel    3. History of prostate cancer  Z85.46 PSA    4. Screening exam for skin cancer  Z12.83 Ambulatory referral to Dermatology    5. B12 deficiency  E53.8 Vitamin B12     No orders of the defined types were placed in this  encounter.  Return precautions advised.  Garret Reddish, MD

## 2022-01-09 NOTE — Telephone Encounter (Signed)
John, This pt's PV in 01-10-22.  Would you please review his chart- he does have a complicated medical hx.  Thanks, [employer]

## 2022-01-09 NOTE — Patient Instructions (Addendum)
-  recommended trial of voltaren/diclofenac gel 4x a day for at least 1 week or 2 weeks and if not improving can refer to sports medicine  [facility] contact Please call to schedule visit and/or procedure Address: [address], [place], [place] [address] Phone: [phone]   We will call you within two weeks about your referral to dermatology (will probably be several months to get in). If you do not hear within 2 weeks, give [place] a call.    Please stop by lab before you go If you have mychart- we will send your results within 3 business days of [place] receiving them.  If you do not have mychart- we will call you about results within 5 business days of [place] receiving them.  *please also note that you will see labs on mychart as soon as they post. I will later go in and write notes on them- will say "notes from Dr. Yong Channel"   Recommended follow up: Return in about 6 months (around 07/10/2022) for followup or sooner if needed.Schedule b4 you leave.

## 2022-01-10 ENCOUNTER — Ambulatory Visit: Payer: Medicare Other

## 2022-01-10 ENCOUNTER — Other Ambulatory Visit: Payer: Self-pay

## 2022-01-10 MED ORDER — ATORVASTATIN CALCIUM 10 MG PO TABS: 10.0000 mg | ORAL_TABLET | ORAL | 3 refills | Status: DC

## 2022-01-10 NOTE — Telephone Encounter (Signed)
Cyril Mourning,   This pt is a documented difficult intubation and his procedure will need to be done at the hospital.   Thanks,  Osvaldo Angst

## 2022-01-10 NOTE — Telephone Encounter (Signed)
Please see previous messages-  I have already called and spoken with the patient this AM- and rescheduled his PV for late this month (PV 01/27/2022) - please see if we can get him on Dr. Lynne Leader schedule for [facility] for 2023- patient is aware of need for hospital case-

## 2022-01-10 NOTE — Telephone Encounter (Signed)
Dr.Stark, You saw this patient in 04/2021 and advised for the patient to be scheduled for a colon, however, through further review of his chart, it was found that this patient was a difficult intubation in the past.  Jose Ware has requested the patient's procedure be completed at [facility]. Please advise if you wish to see the patient again in the office prior to the colon or if the patient can be a direct at the hospital -- He is scheduled for a PV TODAY 01/10/2022 . Thank you Bre

## 2022-01-13 NOTE — Telephone Encounter (Signed)
Jose Ware, Just trying to see if there is any updates on if there is an opening in Dr. Lynne Leader schedule at [facility] for this patient to be added in- his Pre Visit appt has been rescheduled for 01/27/2022 with hopes of scheduling him within the next few weeks at [facility]- please update Pre Visit ASAP with any information- as well as the patient Thank you

## 2022-01-14 ENCOUNTER — Other Ambulatory Visit: Payer: Self-pay

## 2022-01-14 DIAGNOSIS — Z86010 Personal history of colonic polyps: Secondary | ICD-10-CM

## 2022-01-14 NOTE — Telephone Encounter (Signed)
Spoke with patient & he has been advised on scheduling. No further questions.

## 2022-01-14 NOTE — Telephone Encounter (Signed)
Patient has been scheduled for colon on 03/13/22 at 12:30 pm at [facility] with Dr. Fuller Plan. Left message for patient to call back.

## 2022-01-15 ENCOUNTER — Encounter (HOSPITAL_BASED_OUTPATIENT_CLINIC_OR_DEPARTMENT_OTHER): Payer: Self-pay | Admitting: Pulmonary Disease

## 2022-01-15 ENCOUNTER — Ambulatory Visit (HOSPITAL_BASED_OUTPATIENT_CLINIC_OR_DEPARTMENT_OTHER): Payer: Medicare Other | Admitting: Pulmonary Disease

## 2022-01-15 VITALS — BP 118/78 | HR 55 | Temp 98.5°F | Ht 73.0 in | Wt 204.0 lb

## 2022-01-15 DIAGNOSIS — G4733 Obstructive sleep apnea (adult) (pediatric): Secondary | ICD-10-CM

## 2022-01-15 NOTE — Patient Instructions (Signed)
Follow up in 1 year.

## 2022-01-15 NOTE — Progress Notes (Signed)
[facility]  Chief Complaint  Patient presents with   Follow-up    Pt states he is having more SOB with exertion.    Constitutional:  BP 118/78 (BP Location: Right Arm, Patient Position: Sitting, Cuff Size: Normal)   Pulse (!) 55   Temp 98.5 F (36.9 C) (Oral)   Ht '6\' 1"'$  (1.854 m)   Wt 204 lb (92.5 kg)   SpO2 98%   BMI 26.91 kg/m   Past Medical History:  Prostate cancer, Colon polyps, CAD, Cataract, CVA, Diverticulitis, DJD, HTN, Ischemic CM, Nephrolithiasis, Neuropathy, Psoriasis  Past Surgical History:  He  has a past surgical history that includes Cholecystectomy (1986); REVERSAL OF COLOSTOMY (1987); Wrist surgery (Left); Colostomy (1985); Knee arthroscopy (Left, 2003); Appendectomy (1985); Tonsillectomy (1946); Knee surgery (1956); Cataract extraction, bilateral (2013); Back surgery (1980); Eye surgery; Colon surgery (1980's); Parathyroidectomy (N/A, 05/12/2014); Colonoscopy; Cardiac catheterization (N/A, 11/04/2015); Cardiac catheterization (N/A, 11/04/2015); and Cardiac catheterization (N/A, 11/06/2015).  Brief Summary:  Jose Ware is a 87 y.o. male former smoker with obstructive sleep apnea.      Subjective:   He gets winded more easily with brisk activity since his last cardiac event.  He does okay when he paces himself more.  Uses CPAP nightly.  Uses a full face mask.  No issues with mask fit or pressure setting.  Gets sleepy sometimes when he is sitting quiet, but does okay as long as he stays active.  Physical Exam:   Appearance - well kempt   ENMT - no sinus tenderness, no oral exudate, no LAN, Mallampati 3 airway, no stridor  Respiratory - equal breath sounds bilaterally, no wheezing or rales  CV - s1s2 regular rate and rhythm, no murmurs  Ext - no clubbing, no edema  Skin - no rashes  Psych - normal mood and affect     Sleep Tests:  PSG 01/09/10 >> AHI 1.5 HST 04/11/20 >> AHI 24.4, SpO2 low 82% Auto  CPAP 12/15/21 to 01/13/22 >> used on 29 of 30 nights with average 6 hrs 44 min.  Average AHI 5.1 with median CPAP 9 and 95 th percentile CPAP 13 cm H2O  Cardiac Tests:  Echo 07/29/21 >> mild/mod LV systolic dysfx, mild LVH, severe LA dilation, mild MR  Social History:  He  reports that he quit smoking about 38 years ago. His smoking use included cigarettes. He has a 74.00 pack-year smoking history. He has never used smokeless tobacco. He reports current alcohol use. He reports that he does not use drugs.  Family History:  His family history includes Alcohol abuse in his father; Anemia in his sister; Breast cancer in his sister; Cirrhosis in his father; Clotting disorder in his brother; Coronary artery disease in his brother and mother; Heart attack in his brother; Heart failure in his sister; Non-Hodgkin's lymphoma in his brother, brother, and sister; Other in his sister.     Assessment/Plan:   Obstructive sleep apnea. - he is compliant with therapy and reports benefit from CPAP - he uses [employer] for his DME - current CPAP ordered March 2022 - continue auto CPAP 5 to 15 cm H2O  Time Spent Involved in Patient Care on Day of Examination:  18 minutes  Follow up:   Patient Instructions  Follow up in 1 year  Medication List:   Allergies as of 01/15/2022       Reactions   Bee Venom Swelling   Facial swelling   Betadine [povidone Iodine]  Other (See Comments)   blistering   Doxycycline    Possible drug rash- back of right lower leg and onto left lower leg as well        Medication List        Accurate as of January 15, 2022 11:16 AM. If you have any questions, ask your nurse or doctor.          acetaminophen 650 MG CR tablet Commonly known as: TYLENOL Take 650 mg by mouth 2 (two) times daily as needed for pain (headache).   amLODipine 5 MG tablet Commonly known as: NORVASC Take 1 tablet by mouth once daily   aspirin EC 81 MG tablet Take 1 tablet (81 mg total) by  mouth daily.   atorvastatin 10 MG tablet Commonly known as: LIPITOR Take 1 tablet (10 mg total) by mouth every other day.   carvedilol 3.125 MG tablet Commonly known as: COREG Take 1 tablet by mouth twice daily   losartan 100 MG tablet Commonly known as: COZAAR Take 1 tablet by mouth once daily   nitroGLYCERIN 0.4 MG SL tablet Commonly known as: NITROSTAT Place 1 tablet (0.4 mg total) under the tongue every 5 (five) minutes x 3 doses as needed for chest pain.   OCUVITE ADULT 50+ PO   spironolactone 25 MG tablet Commonly known as: ALDACTONE Take 1 tablet by mouth once daily   Vitamin B-12 5000 MCG Tbdp Take 5,000 mcg by mouth every morning.        Signature:  Chesley Mires, MD [place] Pager - [phone] 01/15/2022, 11:16 AM

## 2022-01-20 ENCOUNTER — Inpatient Hospital Stay: Payer: Medicare Other | Attending: Hematology and Oncology | Admitting: Hematology and Oncology

## 2022-01-20 ENCOUNTER — Inpatient Hospital Stay (HOSPITAL_BASED_OUTPATIENT_CLINIC_OR_DEPARTMENT_OTHER): Payer: Medicare Other

## 2022-01-20 ENCOUNTER — Other Ambulatory Visit: Payer: Self-pay

## 2022-01-20 VITALS — BP 135/75 | HR 55 | Temp 97.5°F | Resp 17 | Wt 206.1 lb

## 2022-01-20 DIAGNOSIS — I700 Atherosclerosis of aorta: Secondary | ICD-10-CM | POA: Diagnosis not present

## 2022-01-20 DIAGNOSIS — Z803 Family history of malignant neoplasm of breast: Secondary | ICD-10-CM | POA: Insufficient documentation

## 2022-01-20 DIAGNOSIS — I10 Essential (primary) hypertension: Secondary | ICD-10-CM | POA: Diagnosis not present

## 2022-01-20 DIAGNOSIS — Z87891 Personal history of nicotine dependence: Secondary | ICD-10-CM | POA: Insufficient documentation

## 2022-01-20 DIAGNOSIS — R590 Localized enlarged lymph nodes: Secondary | ICD-10-CM | POA: Diagnosis not present

## 2022-01-20 DIAGNOSIS — Z807 Family history of other malignant neoplasms of lymphoid, hematopoietic and related tissues: Secondary | ICD-10-CM | POA: Diagnosis not present

## 2022-01-20 DIAGNOSIS — C8213 Follicular lymphoma grade II, intra-abdominal lymph nodes: Secondary | ICD-10-CM | POA: Diagnosis not present

## 2022-01-20 DIAGNOSIS — C8293 Follicular lymphoma, unspecified, intra-abdominal lymph nodes: Secondary | ICD-10-CM | POA: Diagnosis not present

## 2022-01-20 DIAGNOSIS — Z8673 Personal history of transient ischemic attack (TIA), and cerebral infarction without residual deficits: Secondary | ICD-10-CM | POA: Insufficient documentation

## 2022-01-20 DIAGNOSIS — Z8546 Personal history of malignant neoplasm of prostate: Secondary | ICD-10-CM | POA: Insufficient documentation

## 2022-01-20 NOTE — Progress Notes (Signed)
[place] [phone]) [phone]   [phone]) [phone]  PROGRESS NOTE  Patient Care Team: [name], MD as PCP - General (Family Medicine) Martinique, Peter M, MD as PCP - Cardiology (Cardiology) Lowella Bandy, MD (Inactive) (Urology) Ladene Artist, MD (Gastroenterology) Martinique, Peter M, MD as Consulting Physician (Cardiology) Keene Breath., MD as Consulting Physician (Ophthalmology) Festus Aloe, MD as Consulting Physician (Urology) Edythe Clarity, [facility] as Pharmacist (Pharmacist)  Hematological/Oncological History # Follicular Lymphoma Stage I-II 12/04/2020: CT C/A/P shows periaortic adenopathy in the upper abdomen. No evidence of disease in the chest 12/17/2020: biopsy of lymph node shows result consistent with follicular lymphoma. 01/09/2021: transition care to Dr. Lorenso Courier  07/17/2021: CT C/A/P showed slight progression of disease as demonstrated by slight interval enlargement of several retroperitoneal lymph nodes and a mildly enlarged low middle mediastinal lymph node.  Interval History:  Jose Ware 87 y.o. male with medical history significant for newly diagnosed early stage follicular lymphoma who presents for a follow up visit. The patient's last visit was on 07/19/2021 . In the interim since the last visit he has had no changes in his health.   On exam today Jose Ware is accompanied by his wife.  He reports that his energy levels have been quite low.  He reports that he sleeps easily and is tired quite a lot.  He takes naps but these are "not to deliver at".  He notes that his appetite is quite good and he is not having any nausea or vomiting.  He does have loose stools which have been persistent since 1985 after he had a bowel surgery.  He is not having any other major systemic symptoms.  His weight is typically stable between 205 and 210.  He denies feeling any lymphadenopathy in his neck, underarms, or groin. He notes that he otherwise is  not having any fevers, chills, sweats, nausea, vomiting or diarrhea.  Full 10 point ROS is listed below.  MEDICAL HISTORY:  Past Medical History:  Diagnosis Date   Adenocarcinoma of prostate [facility])    XRT & Radiation seed implantation in 2010   Adenomatous colon polyp    CAD (coronary artery disease)    a. 11/04/15:  Acute inferolateral STEMI: S/p emergent DES of a very large LCx with extensive thrombus. Significant residual disease in the proximal LAD and distal RCA. S/p staged PCI of LAD and RCA 8/29.   Cataract    CVA (cerebral infarction)    a. 03/1939: embolic CVA after heart cath    CVA (cerebral infarction)    a. 09/4079: embolic CVA after heart cath    DIVERTICULITIS, HX OF 11/27/2006        DJD (degenerative joint disease)    wrist - R    Hernia    History of blood transfusion 1985   with colon surgery   Hypertension    Ischemic cardiomyopathy    a. EF 25-30% by LV gram on cath 11/04/15. Appeared out of proportion to infarct although infarct was large. EF improved by Echo and now 40-45%.   Myocardial infarction ([place])    NEPHROLITHIASIS, HX OF 11/27/2006         Peripheral neuropathy    Psoriasis    Tenosynovitis     SURGICAL HISTORY: Past Surgical History:  Procedure Laterality Date   [place]   minor disk surgery: instrumentation placed and removed in a second procedure   CARDIAC CATHETERIZATION N/A 11/04/2015  Procedure: Left Heart Cath and Coronary Angiography;  Surgeon: Peter M Martinique, MD;  Location: [place] [facility];  Service: Cardiovascular;  Laterality: N/A;   CARDIAC CATHETERIZATION N/A 11/04/2015   Procedure: Coronary Stent Intervention;  Surgeon: Peter M Martinique, MD;  Location: [place] [facility];  Service: Cardiovascular;  Laterality: N/A;   CARDIAC CATHETERIZATION N/A 11/06/2015   Procedure: Coronary Stent Intervention;  Surgeon: Leonie Man, MD;  Location: [place] [facility];  Service: Cardiovascular;  Laterality: N/A;    CATARACT EXTRACTION, BILATERAL  2013   CHOLECYSTECTOMY  1986   COLON SURGERY  1980's   pt. had ileus, had colon surgery, requiring colostomy & then reversal & then dehisence of that wound & return to OR for repair & cholecystectomy     COLONOSCOPY     COLOSTOMY  1985   After colectomy for diverticulitis, pt. remarks during this surgery they "gave me the paddles two times  because of bleeding", pt. states it was unrelated to any anesthesia complication   EYE SURGERY     /w IOL   KNEE ARTHROSCOPY Left 2003   [place], Right w/cartilage removed later   PARATHYROIDECTOMY N/A 05/12/2014   Procedure: PARATHYROIDECTOMY;  Surgeon: Armandina Gemma, MD;  Location: [place];  Service: General;  Laterality: N/A;   REVERSAL OF COLOSTOMY  1987   TONSILLECTOMY  1946   WRIST SURGERY Left    Remote complicated w/infection    SOCIAL HISTORY: Social History   Socioeconomic History   Marital status: Married    Spouse name: Not on file   Number of children: 3   Years of education: Not on file   Highest education level: Not on file  Occupational History   Not on file  Tobacco Use   Smoking status: Former    Packs/day: 2.00    Years: 37.00    Total pack years: 74.00    Types: Cigarettes    Quit date: 03/11/1983    Years since quitting: 38.8   Smokeless tobacco: Never   Tobacco comments:    started smoking in the service; ICU 85' part of his stomach; appendix; coma   Vaping Use   Vaping Use: Never used  Substance and Sexual Activity   Alcohol use: Yes    Alcohol/week: 0.0 standard drinks of alcohol    Comment: RARE   Drug use: No   Sexual activity: Not on file  Other Topics Concern   Not on file  Social History Narrative   [place], [place].Married 34 - Divorced 1996; remarried '03 different wife3 sons - '63, '65, '67Grandchildren -14; 1 great grand daughter; 4 great grands on wife's side, 1 great great granddaughter      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      Hobbies: fishing, busy volunteering   Social Determinants of [place] Strain: [place]  (05/17/2021)   Overall Financial Resource Strain (CARDIA)    Difficulty of Paying Living Expenses: Not hard at all  Food Insecurity: No Food Insecurity (05/17/2021)   Hunger Vital Sign    Worried About Running Out of Food in the Last Year: Never true    [place] in the Last Year: Never true  Transportation Needs: No Transportation Needs (05/17/2021)   PRAPARE - Hydrologist (Medical): No    Lack of Transportation (Non-Medical): No  Physical Activity: Sufficiently Active (05/17/2021)  Exercise Vital Sign    Days of Exercise per Week: 3 days    Minutes of Exercise per Session: 60 min  Stress: No Stress Concern Present (05/17/2021)   [place]    Feeling of Stress : Not at all  Social Connections: [place] (05/17/2021)   Social Connection and Isolation Panel [NHANES]    Frequency of Communication with Friends and Family: Once a week    Frequency of Social Gatherings with Friends and Family: More than three times a week    Attends Religious Services: More than 4 times per year    Active Member of [employer] or Organizations: Yes    Attends Archivist Meetings: 1 to 4 times per year    Marital Status: Married  Human resources officer Violence: Not At Risk (05/17/2021)   Humiliation, Afraid, Rape, and Kick questionnaire    Fear of Current or Ex-Partner: No    Emotionally Abused: No    Physically Abused: No    Sexually Abused: No    FAMILY HISTORY: Family History  Problem Relation Age of Onset   Coronary artery disease Mother    Alcohol abuse Father    Cirrhosis Father    Heart failure Sister    Breast cancer Sister        W/involvement of right arm leading to amputation   Anemia Sister        related to treatment to lymphoma    Non-Hodgkin's lymphoma Sister    Other Sister        died from covid 2020-05-25   Coronary artery disease Brother    Heart attack Brother    Non-Hodgkin's lymphoma Brother    Clotting disorder Brother    Non-Hodgkin's lymphoma Brother    Prostate cancer Neg Hx    Colon cancer Neg Hx    Diabetes Neg Hx    Glaucoma Neg Hx    Rectal cancer Neg Hx    Esophageal cancer Neg Hx     ALLERGIES:  is allergic to bee venom, betadine [povidone iodine], and doxycycline.  MEDICATIONS:  Current Outpatient Medications  Medication Sig Dispense Refill   acetaminophen (TYLENOL) 650 MG CR tablet Take 650 mg by mouth 2 (two) times daily as needed for pain (headache).     amLODipine (NORVASC) 5 MG tablet Take 1 tablet by mouth once daily 90 tablet 0   aspirin EC 81 MG tablet Take 1 tablet (81 mg total) by mouth daily. 90 tablet 1   atorvastatin (LIPITOR) 10 MG tablet Take 1 tablet (10 mg total) by mouth every other day. 43 tablet 3   carvedilol (COREG) 3.125 MG tablet Take 1 tablet by mouth twice daily 180 tablet 0   Cyanocobalamin (VITAMIN B-12) 5000 MCG TBDP Take 5,000 mcg by mouth every morning.     losartan (COZAAR) 100 MG tablet Take 1 tablet by mouth once daily 90 tablet 3   Multiple Vitamins-Minerals (OCUVITE ADULT 50+ PO)      nitroGLYCERIN (NITROSTAT) 0.4 MG SL tablet Place 1 tablet (0.4 mg total) under the tongue every 5 (five) minutes x 3 doses as needed for chest pain. 25 tablet 2   spironolactone (ALDACTONE) 25 MG tablet Take 1 tablet by mouth once daily 90 tablet 0   No current facility-administered medications for this visit.    REVIEW OF SYSTEMS:   Constitutional: ( - ) fevers, ( - )  chills , ( - ) night sweats Eyes: ( - )  blurriness of vision, ( - ) double vision, ( - ) watery eyes Ears, nose, mouth, throat, and face: ( - ) mucositis, ( - ) sore throat Respiratory: ( - ) cough, ( - ) dyspnea, ( - ) wheezes Cardiovascular: ( - ) palpitation, ( - ) chest discomfort, ( - ) lower extremity  swelling Gastrointestinal:  ( - ) nausea, ( - ) heartburn, ( - ) change in bowel habits Skin: ( - ) abnormal skin rashes Lymphatics: ( - ) new lymphadenopathy, ( - ) easy bruising Neurological: ( - ) numbness, ( - ) tingling, ( - ) new weaknesses Behavioral/Psych: ( - ) mood change, ( - ) new changes  All other systems were reviewed with the patient and are negative.  PHYSICAL EXAMINATION: ECOG PERFORMANCE STATUS: 0 - Asymptomatic  Vitals:   01/20/22 1050  BP: 135/75  Pulse: (!) 55  Resp: 17  Temp: (!) 97.5 F (36.4 C)  SpO2: 96%   Filed Weights   01/20/22 1050  Weight: 206 lb 1.6 oz (93.5 kg)    GENERAL: Well-appearing elderly Caucasian male, alert, no distress and comfortable SKIN: skin color, texture, turgor are normal, no rashes or significant lesions EYES: conjunctiva are pink and non-injected, sclera clear LUNGS: clear to auscultation and percussion with normal breathing effort HEART: regular rate & rhythm and no murmurs and no lower extremity edema Musculoskeletal: no cyanosis of digits and no clubbing  PSYCH: alert & oriented x 3, fluent speech NEURO: no focal motor/sensory deficits  LABORATORY DATA:  I have reviewed the data as listed    Latest Ref Rng & Units 01/09/2022   10:46 AM 07/19/2021    7:38 AM 04/11/2021   10:25 AM  CBC  WBC 4.0 - 10.5 K/uL 8.3  8.1  8.3   Hemoglobin 13.0 - 17.0 g/dL 11.5  12.6  12.9   Hematocrit 39.0 - 52.0 % 34.9  38.4  39.6   Platelets 150.0 - 400.0 K/uL 208.0  228  218        Latest Ref Rng & Units 01/09/2022   10:46 AM 07/19/2021    7:38 AM 07/15/2021    2:32 PM  CMP  Glucose 70 - 99 mg/dL 88  86    BUN 6 - 23 mg/dL 33  32    Creatinine 0.40 - 1.50 mg/dL 1.01  1.33  1.50   Sodium 135 - 145 mEq/L 143  140    Potassium 3.5 - 5.1 mEq/L 4.1  4.4    Chloride 96 - 112 mEq/L 106  103    CO2 19 - 32 mEq/L 29  29    Calcium 8.4 - 10.5 mg/dL 8.7  9.3    Total Protein 6.0 - 8.3 g/dL 5.9  7.0    Total Bilirubin 0.2 - 1.2 mg/dL 0.9   1.2    Alkaline Phos 39 - 117 U/L 64  68    AST 0 - 37 U/L 10  10    ALT 0 - 53 U/L 6  7     RADIOGRAPHIC STUDIES: No results found.  ASSESSMENT & PLAN ALEXIUS HANGARTNER 87 y.o. male with medical history significant for newly diagnosed early stage follicular lymphoma who presents for a follow up visit.   Previously we discussed the diagnosis of follicular lymphoma.  Follicular lymphoma tends to be a more indolent lymphoma and therefore treatment is not immediately required.  We discussed the criteria the patient have to meet in order to begin treatment.  This included the GELF criteria.  As the patient is currently low stage and does not meet any of these criteria I would recommend observation at this time.  The patient and his wife were in agreement with this plan moving forward.  FLIPI Score: Low Risk (Age >60). 10 year OS is 16%  # Follicular Lymphoma Stage I-II -- Findings are consistent with a low-grade lymphoma, most likely follicular lymphoma.  At this time disease appears quite limited in stage --No clear indication to start treatment at this time as patient does not meet any GELF Criteria --given the abdominal location and possible mediastinal involvement would recommend against local therapy.  -- Labs show blood cell count 8.3, Hgb 11.5, MCV 98.3, Plt 208 --Plan for a return to clinic in 6 months time with CT scan as clinically indicated.   #Aortic Calcification -- Incidentally noted on a CT scan --No abnormalities on physical examination, no murmur heard --echocardiogram shows mild restriction.   No orders of the defined types were placed in this encounter.  All questions were answered. The patient knows to call the clinic with any problems, questions or concerns.  A total of more than 30 minutes were spent on this encounter with face-to-face time and non-face-to-face time, including preparing to see the patient, ordering tests and/or medications, counseling the patient and  coordination of care as outlined above.   Jose Peoples, MD Department of Hematology/Oncology [place] at [facility] Phone: [phone] Pager: [phone] Email: [email or web address]  01/20/2022 11:18 AM

## 2022-01-27 ENCOUNTER — Ambulatory Visit (AMBULATORY_SURGERY_CENTER): Payer: Medicare Other

## 2022-01-27 VITALS — Ht 73.0 in | Wt 206.0 lb

## 2022-01-27 DIAGNOSIS — Z86010 Personal history of colonic polyps: Secondary | ICD-10-CM

## 2022-01-27 NOTE — Progress Notes (Signed)
No egg or soy allergy known to patient  No issues known to pt with past sedation with any surgeries or procedures Patient denies ever being told they had issues or difficulty with intubation  No FH of Malignant Hyperthermia Pt is not on diet pills Pt is not on  home 02  Pt is not on blood thinners  Pt denies issues with constipation  No A fib or A flutter Have any cardiac testing pending--no  Pt to have procedure at [facility] 03-13-22  Pt has suprep at home already  Pt to have 2 day prep due to "adequate" prep last time.  Pt verb understanding of all instructions and will call if any questions.

## 2022-02-19 ENCOUNTER — Encounter: Payer: Medicare Other | Admitting: Gastroenterology

## 2022-02-21 ENCOUNTER — Other Ambulatory Visit: Payer: Self-pay | Admitting: Cardiology

## 2022-03-05 ENCOUNTER — Encounter (HOSPITAL_COMMUNITY): Payer: Self-pay | Admitting: Gastroenterology

## 2022-03-05 ENCOUNTER — Other Ambulatory Visit: Payer: Self-pay

## 2022-03-10 ENCOUNTER — Other Ambulatory Visit: Payer: Self-pay | Admitting: Family Medicine

## 2022-03-11 ENCOUNTER — Encounter: Payer: Self-pay | Admitting: Gastroenterology

## 2022-03-11 DIAGNOSIS — G4733 Obstructive sleep apnea (adult) (pediatric): Secondary | ICD-10-CM | POA: Diagnosis not present

## 2022-03-13 ENCOUNTER — Telehealth: Payer: Self-pay | Admitting: Pharmacist

## 2022-03-13 ENCOUNTER — Ambulatory Visit (HOSPITAL_COMMUNITY): Payer: Medicare Other | Admitting: Anesthesiology

## 2022-03-13 ENCOUNTER — Ambulatory Visit (HOSPITAL_COMMUNITY)
Admission: RE | Admit: 2022-03-13 | Discharge: 2022-03-13 | Disposition: A | Discharge status: Home / Self Care | Payer: Medicare Other | Attending: Gastroenterology | Admitting: Gastroenterology

## 2022-03-13 ENCOUNTER — Encounter (HOSPITAL_COMMUNITY): Payer: Self-pay | Admitting: Gastroenterology

## 2022-03-13 ENCOUNTER — Ambulatory Visit (HOSPITAL_BASED_OUTPATIENT_CLINIC_OR_DEPARTMENT_OTHER): Payer: Medicare Other | Admitting: Anesthesiology

## 2022-03-13 ENCOUNTER — Other Ambulatory Visit: Payer: Self-pay

## 2022-03-13 ENCOUNTER — Encounter (HOSPITAL_COMMUNITY)
Admission: RE | Disposition: A | Discharge status: Home / Self Care | Payer: Self-pay | Source: Home / Self Care | Attending: Gastroenterology

## 2022-03-13 DIAGNOSIS — Z09 Encounter for follow-up examination after completed treatment for conditions other than malignant neoplasm: Secondary | ICD-10-CM | POA: Diagnosis not present

## 2022-03-13 DIAGNOSIS — K640 First degree hemorrhoids: Secondary | ICD-10-CM

## 2022-03-13 DIAGNOSIS — D128 Benign neoplasm of rectum: Secondary | ICD-10-CM | POA: Diagnosis not present

## 2022-03-13 DIAGNOSIS — Z87891 Personal history of nicotine dependence: Secondary | ICD-10-CM

## 2022-03-13 DIAGNOSIS — I10 Essential (primary) hypertension: Secondary | ICD-10-CM

## 2022-03-13 DIAGNOSIS — Z9049 Acquired absence of other specified parts of digestive tract: Secondary | ICD-10-CM | POA: Diagnosis not present

## 2022-03-13 DIAGNOSIS — I252 Old myocardial infarction: Secondary | ICD-10-CM | POA: Diagnosis not present

## 2022-03-13 DIAGNOSIS — D125 Benign neoplasm of sigmoid colon: Secondary | ICD-10-CM | POA: Diagnosis not present

## 2022-03-13 DIAGNOSIS — K648 Other hemorrhoids: Secondary | ICD-10-CM | POA: Diagnosis not present

## 2022-03-13 DIAGNOSIS — D126 Benign neoplasm of colon, unspecified: Secondary | ICD-10-CM | POA: Diagnosis not present

## 2022-03-13 DIAGNOSIS — G4730 Sleep apnea, unspecified: Secondary | ICD-10-CM | POA: Insufficient documentation

## 2022-03-13 DIAGNOSIS — D123 Benign neoplasm of transverse colon: Secondary | ICD-10-CM

## 2022-03-13 DIAGNOSIS — I2510 Atherosclerotic heart disease of native coronary artery without angina pectoris: Secondary | ICD-10-CM

## 2022-03-13 DIAGNOSIS — Z1211 Encounter for screening for malignant neoplasm of colon: Secondary | ICD-10-CM | POA: Insufficient documentation

## 2022-03-13 DIAGNOSIS — Z86010 Personal history of colonic polyps: Secondary | ICD-10-CM | POA: Insufficient documentation

## 2022-03-13 DIAGNOSIS — K5730 Diverticulosis of large intestine without perforation or abscess without bleeding: Secondary | ICD-10-CM | POA: Diagnosis not present

## 2022-03-13 DIAGNOSIS — K621 Rectal polyp: Secondary | ICD-10-CM | POA: Diagnosis not present

## 2022-03-13 DIAGNOSIS — Z952 Presence of prosthetic heart valve: Secondary | ICD-10-CM | POA: Diagnosis not present

## 2022-03-13 DIAGNOSIS — K635 Polyp of colon: Secondary | ICD-10-CM | POA: Diagnosis not present

## 2022-03-13 DIAGNOSIS — D124 Benign neoplasm of descending colon: Secondary | ICD-10-CM

## 2022-03-13 DIAGNOSIS — F17200 Nicotine dependence, unspecified, uncomplicated: Secondary | ICD-10-CM | POA: Diagnosis not present

## 2022-03-13 DIAGNOSIS — Z860101 Personal history of adenomatous and serrated colon polyps: Secondary | ICD-10-CM

## 2022-03-13 HISTORY — DX: Pneumonia, unspecified organism: J18.9

## 2022-03-13 HISTORY — PX: POLYPECTOMY: SHX5525

## 2022-03-13 HISTORY — PX: COLONOSCOPY WITH PROPOFOL: SHX5780

## 2022-03-13 HISTORY — DX: Personal history of urinary calculi: Z87.442

## 2022-03-13 HISTORY — DX: Sleep apnea, unspecified: G47.30

## 2022-03-13 SURGERY — COLONOSCOPY WITH PROPOFOL
Anesthesia: Monitor Anesthesia Care

## 2022-03-13 MED ORDER — PROPOFOL 500 MG/50ML IV EMUL
INTRAVENOUS | Status: DC | PRN
  Administered 2022-03-13: 125 ug/kg/min via INTRAVENOUS

## 2022-03-13 MED ORDER — LACTATED RINGERS IV SOLN
INTRAVENOUS | Status: AC | PRN
  Administered 2022-03-13: 20 mL/h via INTRAVENOUS

## 2022-03-13 MED ORDER — PROPOFOL 10 MG/ML IV BOLUS
INTRAVENOUS | Status: DC | PRN
  Administered 2022-03-13: 20 mg via INTRAVENOUS

## 2022-03-13 MED ORDER — SODIUM CHLORIDE 0.9 % IV SOLN: INTRAVENOUS | Status: DC

## 2022-03-13 MED ORDER — PROPOFOL 1000 MG/100ML IV EMUL
INTRAVENOUS | Status: AC
  Filled 2022-03-13: qty 100

## 2022-03-13 MED ORDER — LACTATED RINGERS IV SOLN: INTRAVENOUS | Status: DC

## 2022-03-13 SURGICAL SUPPLY — 22 items

## 2022-03-13 NOTE — Anesthesia Preprocedure Evaluation (Signed)
Anesthesia Evaluation  Patient identified by MRN, date of birth, ID band Patient awake    Reviewed: Allergy & Precautions, H&P , NPO status , Patient's Chart, lab work & pertinent test results  Airway Mallampati: II  TM Distance: >3 FB Neck ROM: Full    Dental no notable dental hx.    Pulmonary sleep apnea , former smoker   Pulmonary exam normal breath sounds clear to auscultation       Cardiovascular hypertension, + CAD and + Past MI   Rhythm:Regular Rate:Normal + Systolic murmurs S/P AVR   Neuro/Psych negative neurological ROS  negative psych ROS   GI/Hepatic negative GI ROS, Neg liver ROS,,,  Endo/Other  negative endocrine ROS    Renal/GU negative Renal ROS  negative genitourinary   Musculoskeletal negative musculoskeletal ROS (+)    Abdominal   Peds negative pediatric ROS (+)  Hematology negative hematology ROS (+)   Anesthesia Other Findings   Reproductive/Obstetrics negative OB ROS                             Anesthesia Physical Anesthesia Plan  ASA: 3  Anesthesia Plan: MAC   Post-op Pain Management:    Induction: Intravenous  PONV Risk Score and Plan: 1 and Propofol infusion and Treatment may vary due to age or medical condition  Airway Management Planned: Simple Face Mask  Additional Equipment:   Intra-op Plan:   Post-operative Plan:   Informed Consent: I have reviewed the patients History and Physical, chart, labs and discussed the procedure including the risks, benefits and alternatives for the proposed anesthesia with the patient or authorized representative who has indicated his/her understanding and acceptance.     Dental advisory given  Plan Discussed with: CRNA and Surgeon  Anesthesia Plan Comments:        Anesthesia Quick Evaluation

## 2022-03-13 NOTE — Discharge Instructions (Signed)
YOU HAD AN ENDOSCOPIC PROCEDURE TODAY: Refer to the procedure report and other information in the discharge instructions given to you for any specific questions about what was found during the examination. If this information does not answer your questions, please call [place] office at [phone] to clarify.  ° °YOU SHOULD EXPECT: Some feelings of bloating in the abdomen. Passage of more gas than usual. Walking can help get rid of the air that was put into your GI tract during the procedure and reduce the bloating. If you had a lower endoscopy (such as a colonoscopy or flexible sigmoidoscopy) you may notice spotting of blood in your stool or on the toilet paper. Some abdominal soreness may be present for a day or two, also. ° °DIET: Your first meal following the procedure should be a light meal and then it is ok to progress to your normal diet. A half-sandwich or bowl of soup is an example of a good first meal. Heavy or fried foods are harder to digest and may make you feel nauseous or bloated. Drink plenty of fluids but you should avoid alcoholic beverages for 24 hours. If you had a esophageal dilation, please see attached instructions for diet.   ° °ACTIVITY: Your care partner should take you home directly after the procedure. You should plan to take it easy, moving slowly for the rest of the day. You can resume normal activity the day after the procedure however YOU SHOULD NOT DRIVE, use power tools, machinery or perform tasks that involve climbing or major physical exertion for 24 hours (because of the sedation medicines used during the test).  ° °SYMPTOMS TO REPORT IMMEDIATELY: °A gastroenterologist can be reached at any hour. Please call [phone]  for any of the following symptoms:  °Following lower endoscopy (colonoscopy, flexible sigmoidoscopy) °Excessive amounts of blood in the stool  °Significant tenderness, worsening of abdominal pains  °Swelling of the abdomen that is new, acute  °Fever of 100° or  higher  °Following upper endoscopy (EGD, EUS, ERCP, esophageal dilation) °Vomiting of blood or coffee ground material  °New, significant abdominal pain  °New, significant chest pain or pain under the shoulder blades  °Painful or persistently difficult swallowing  °New shortness of breath  °Black, tarry-looking or red, bloody stools ° °FOLLOW UP:  °If any biopsies were taken you will be contacted by phone or by letter within the next 1-3 weeks. Call [phone]  if you have not heard about the biopsies in 3 weeks.  °Please also call with any specific questions about appointments or follow up tests. ° °

## 2022-03-13 NOTE — Progress Notes (Signed)
Erroneous Encounter

## 2022-03-13 NOTE — Anesthesia Postprocedure Evaluation (Signed)
Anesthesia Post Note  Patient: Jose Ware  Procedure(s) Performed: COLONOSCOPY WITH PROPOFOL POLYPECTOMY     Patient location during evaluation: PACU Anesthesia Type: MAC Level of consciousness: awake and alert Pain management: pain level controlled Vital Signs Assessment: post-procedure vital signs reviewed and stable Respiratory status: spontaneous breathing, nonlabored ventilation, respiratory function stable and patient connected to nasal cannula oxygen Cardiovascular status: stable and blood pressure returned to baseline Postop Assessment: no apparent nausea or vomiting Anesthetic complications: no  No notable events documented.  Last Vitals:  Vitals:   03/13/22 1320 03/13/22 1325  BP: 114/73   Pulse: (!) 51 (!) 51  Resp: 14 18  Temp:    SpO2: 99% 97%    Last Pain:  Vitals:   03/13/22 1318  TempSrc: Temporal  PainSc: 0-No pain                 Damarian Priola S

## 2022-03-13 NOTE — H&P (Signed)
History & Physical  Primary Care Physician:  [name], MD Primary Gastroenterologist: Lucio Edward, MD  CHIEF COMPLAINT:  Personal history of colon polyps   HPI: Jose Ware is a 88 y.o. male with a personal history of piecemeal removal of a large tubulovillous adenoma in 2016 and 2017. Multiple adenomatous colon polyps including a 16 mm polyp removed at or near the piecemeal site in 2019 for surveillance colonoscopy.   Past Medical History:  Diagnosis Date   Adenocarcinoma of prostate [facility])    XRT & Radiation seed implantation in 2010   Adenomatous colon polyp    CAD (coronary artery disease)    a. 11/04/15:  Acute inferolateral STEMI: S/p emergent DES of a very large LCx with extensive thrombus. Significant residual disease in the proximal LAD and distal RCA. S/p staged PCI of LAD and RCA 8/29.   Cataract    CVA (cerebral infarction)    a. 09/8293: embolic CVA after heart cath    CVA (cerebral infarction)    a. 08/2128: embolic CVA after heart cath    DIVERTICULITIS, HX OF 11/27/2006        DJD (degenerative joint disease)    wrist - R    Hernia    History of blood transfusion 1985   with colon surgery   History of kidney stones    Hypertension    Ischemic cardiomyopathy    a. EF 25-30% by LV gram on cath 11/04/15. Appeared out of proportion to infarct although infarct was large. EF improved by Echo and now 40-45%.   Myocardial infarction ([place]) 2019   NEPHROLITHIASIS, HX OF 11/27/2006         Non Hodgkin's lymphoma ([place]) 11/2020   watching at this point, in abdomen, no meds or chemo yet as of 01-27-2022   Peripheral neuropathy    pt denies   Pneumonia    as a child   Psoriasis    Sleep apnea    wears cpap   Stroke [facility])    no residuals   Tenosynovitis     Past Surgical History:  Procedure Laterality Date   APPENDECTOMY  03/11/1983   BACK SURGERY  03/10/1978   minor disk surgery: instrumentation placed and removed in a second procedure   CARDIAC  CATHETERIZATION N/A 11/04/2015   Procedure: Left Heart Cath and Coronary Angiography;  Surgeon: Peter M Martinique, MD;  Location: [place] [facility];  Service: Cardiovascular;  Laterality: N/A;   CARDIAC CATHETERIZATION N/A 11/04/2015   Procedure: Coronary Stent Intervention;  Surgeon: Peter M Martinique, MD;  Location: [place] [facility];  Service: Cardiovascular;  Laterality: N/A;   CARDIAC CATHETERIZATION N/A 11/06/2015   Procedure: Coronary Stent Intervention;  Surgeon: Leonie Man, MD;  Location: [place] [facility];  Service: Cardiovascular;  Laterality: N/A;   CATARACT EXTRACTION, BILATERAL  03/11/2011   CHOLECYSTECTOMY  03/10/1984   COLON SURGERY  11/09/1983   pt. had ileus, had colon surgery, requiring colostomy & then reversal & then dehisence of that wound & return to OR for repair & cholecystectomy     COLONOSCOPY  2019   COLOSTOMY  03/11/1983   After colectomy for diverticulitis, pt. remarks during this surgery they "gave me the paddles two times  because of bleeding", pt. states it was unrelated to any anesthesia complication   EYE SURGERY     /w IOL   KNEE ARTHROSCOPY Left 03/10/2001   KNEE SURGERY Left 03/10/1954   Left 1956, Right w/cartilage removed later  PARATHYROIDECTOMY N/A 05/12/2014   Procedure: PARATHYROIDECTOMY;  Surgeon: Armandina Gemma, MD;  Location: [place];  Service: General;  Laterality: N/A;   POLYPECTOMY     REVERSAL OF COLOSTOMY  03/10/1985   TONSILLECTOMY  03/10/1944   WRIST SURGERY Left    Remote complicated w/infection    Prior to Admission medications   Medication Sig Start Date End Date Taking? Authorizing Provider  amLODipine (NORVASC) 5 MG tablet Take 1 tablet by mouth once daily 03/11/22  Yes [name], MD  aspirin EC 81 MG tablet Take 1 tablet (81 mg total) by mouth daily. 11/16/15  Yes Martinique, Peter M, MD  atorvastatin (LIPITOR) 10 MG tablet Take 1 tablet (10 mg total) by mouth every other day. 01/10/22  Yes [name], MD  carvedilol  (COREG) 3.125 MG tablet Take 1 tablet by mouth twice daily 02/21/22  Yes Martinique, Peter M, MD  Cyanocobalamin (VITAMIN B-12) 5000 MCG TBDP Take 5,000 mcg by mouth every morning.   Yes [provider]  losartan (COZAAR) 100 MG tablet Take 1 tablet by mouth once daily 07/22/21  Yes Martinique, Peter M, MD  Multiple Vitamins-Minerals (OCUVITE ADULT 50+ PO) Take 1 tablet by mouth daily. 03/11/19  Yes [provider]  PREVIDENT 5000 DRY MOUTH 1.1 % GEL dental gel Place 1 Application onto teeth at bedtime. 11/20/21  Yes [provider]  spironolactone (ALDACTONE) 25 MG tablet Take 1 tablet by mouth once daily 12/30/21  Yes Martinique, Peter M, MD  nitroGLYCERIN (NITROSTAT) 0.4 MG SL tablet Place 1 tablet (0.4 mg total) under the tongue every 5 (five) minutes x 3 doses as needed for chest pain. 12/27/20   [name], MD    Current Facility-Administered Medications  Medication Dose Route Frequency Provider Last Rate Last Admin   0.9 %  sodium chloride infusion   Intravenous Continuous Ladene Artist, MD       lactated ringers infusion   Intravenous Continuous Ladene Artist, MD       lactated ringers infusion    Continuous PRN Ladene Artist, MD 20 mL/hr at 03/13/22 1232 Continued from Pre-op at 03/13/22 1232    Allergies as of 01/14/2022 - Review Complete 01/09/2022  Allergen Reaction Noted   Bee venom Swelling 11/04/2015   Betadine [povidone iodine] Other (See Comments) 09/27/2011   Doxycycline  02/05/2017    Family History  Problem Relation Age of Onset   Coronary artery disease Mother    Alcohol abuse Father    Cirrhosis Father    Heart failure Sister    Breast cancer Sister        W/involvement of right arm leading to amputation   Anemia Sister        related to treatment to lymphoma   Non-Hodgkin's lymphoma Sister    Other Sister        died from covid 05-24-20   Coronary artery disease Brother    Heart attack Brother    Non-Hodgkin's lymphoma Brother     Clotting disorder Brother    Non-Hodgkin's lymphoma Brother    Prostate cancer Neg Hx    Colon cancer Neg Hx    Diabetes Neg Hx    Glaucoma Neg Hx    Rectal cancer Neg Hx    Esophageal cancer Neg Hx    Colon polyps Neg Hx    Stomach cancer Neg Hx     Social History   Socioeconomic History   Marital status: Married    Spouse  name: Not on file   Number of children: 3   Years of education: Not on file   Highest education level: Not on file  Occupational History   Not on file  Tobacco Use   Smoking status: Former    Packs/day: 2.00    Years: 30.00    Total pack years: 60.00    Types: Cigarettes    Quit date: 03/11/1983    Years since quitting: 39.0   Smokeless tobacco: Never   Tobacco comments:    started smoking in the service; ICU 85' part of his stomach; appendix; coma   Vaping Use   Vaping Use: Never used  Substance and Sexual Activity   Alcohol use: Not Currently    Comment: RARE, maybe one drink a couple times a year   Drug use: Never   Sexual activity: Not Currently  Other Topics Concern   Not on file  Social History Narrative   [employer], [place].Married 18 - Divorced 1996; remarried '03 different wife3 sons - '63, '65, '67Grandchildren -14; 1 great grand daughter; 4 great grands on wife's side, 1 great great granddaughter      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      Hobbies: fishing, busy volunteering   Social Determinants of [place] Strain: [place]  (05/17/2021)   Overall Financial Resource Strain (CARDIA)    Difficulty of Paying Living Expenses: Not hard at all  Food Insecurity: No Food Insecurity (05/17/2021)   Hunger Vital Sign    Worried About Running Out of Food in the Last Year: Never true    [place] in the Last Year: Never true  Transportation Needs: No Transportation Needs (05/17/2021)   PRAPARE - Hydrologist (Medical): No    Lack of Transportation  (Non-Medical): No  Physical Activity: Sufficiently Active (05/17/2021)   Exercise Vital Sign    Days of Exercise per Week: 3 days    Minutes of Exercise per Session: 60 min  Stress: No Stress Concern Present (05/17/2021)   [place]    Feeling of Stress : Not at all  Social Connections: [place] (05/17/2021)   Social Connection and Isolation Panel [NHANES]    Frequency of Communication with Friends and Family: Once a week    Frequency of Social Gatherings with Friends and Family: More than three times a week    Attends Religious Services: More than 4 times per year    Active Member of [employer] or Organizations: Yes    Attends Archivist Meetings: 1 to 4 times per year    Marital Status: Married  Human resources officer Violence: Not At Risk (05/17/2021)   Humiliation, Afraid, Rape, and Kick questionnaire    Fear of Current or Ex-Partner: No    Emotionally Abused: No    Physically Abused: No    Sexually Abused: No    Review of Systems:  All systems reviewed were negative except where noted in HPI.   Physical Exam: Vital signs in last 24 hours: Temp:  [97.6 F (36.4 C)] 97.6 F (36.4 C) (01/04 1150) Pulse Rate:  [58] 58 (01/04 1150) Resp:  [11] 11 (01/04 1150) BP: (190)/(87) 190/87 (01/04 1150) SpO2:  [99 %] 99 % (01/04 1150) Weight:  [92.1 kg] 92.1 kg (01/04 1150)   General:  Alert, well-developed, in NAD Head:  Normocephalic and atraumatic. Eyes:  Sclera clear,  no icterus.   Conjunctiva pink. Ears:  Normal auditory acuity. Mouth:  No deformity or lesions.  Neck:  Supple; no masses . Lungs:  Clear throughout to auscultation.   No wheezes, crackles, or rhonchi. No acute distress. Heart:  Regular rate and rhythm; no murmurs. Abdomen:  Soft, nondistended, nontender. No masses, hepatomegaly. No obvious masses.  Normal bowel .    Rectal:  Deferred   Msk:  Symmetrical without gross  deformities.. Pulses:  Normal pulses noted. Extremities:  Without edema. Neurologic:  Alert and  oriented x4;  grossly normal neurologically. Skin:  Intact without significant lesions or rashes. Psych:  Alert and cooperative. Normal mood and affect.   Impression / Plan:   Personal history of piecemeal removal of a large tubulovillous adenoma in 2016 and 2017. Multiple adenomatous colon polyps including a 16 mm polyp removed at or near the piecemeal site in 2019 for surveillance colonoscopy. The risks (including bleeding, perforation, infection, missed lesions, medication reactions and possible hospitalization or surgery if complications occur), benefits, and alternatives to colonoscopy with possible biopsy and possible polypectomy were discussed with the patient and they consent to proceed.   S/P colostomy, segmental colectomy, reversal of colostomy in 1985. Chronic loose stools, unchanged.  Pricilla Riffle. Fuller Plan  03/13/2022, 12:33 PM See Shea Evans, [facility], to contact our on call provider

## 2022-03-13 NOTE — Op Note (Signed)
[facility] Patient Name: Jose Ware Procedure Date: 03/13/2022 MRN: [id number] Attending MD: Ladene Artist , MD, [id number] Date of Birth: 09/15/1934 CSN: [id number] Age: 88 Admit Type: Outpatient Procedure:                Colonoscopy Indications:              Surveillance: Personal history of adenomatous                            polyps on last colonoscopy > 3 years ago Providers:                Pricilla Riffle. Fuller Plan, MD, Jeanella Cara, RN,                            Gloris Ham, Technician Referring MD:             Brayton Mars. Hunter MD Medicines:                Monitored Anesthesia Care Complications:            No immediate complications. Estimated blood loss:                            None. Estimated Blood Loss:     Estimated blood loss: none. Procedure:                Pre-Anesthesia Assessment:                           - Prior to the procedure, a History and Physical                            was performed, and patient medications and                            allergies were reviewed. The patient's tolerance of                            previous anesthesia was also reviewed. The risks                            and benefits of the procedure and the sedation                            options and risks were discussed with the patient.                            All questions were answered, and informed consent                            was obtained. Prior Anticoagulants: The patient has                            taken no anticoagulant or antiplatelet agents. ASA  Grade Assessment: III - A patient with severe                            systemic disease. After reviewing the risks and                            benefits, the patient was deemed in satisfactory                            condition to undergo the procedure.                           After obtaining informed consent, the colonoscope                             was passed under direct vision. Throughout the                            procedure, the patient's blood pressure, pulse, and                            oxygen saturations were monitored continuously. The                            [id number]-HQ190L ([id number]) Olympus colonoscope was                            introduced through the anus and advanced to the the                            cecum, identified by appendiceal orifice and                            ileocecal valve. The ileocecal valve, appendiceal                            orifice, and rectum were photographed. The quality                            of the bowel preparation was adequate after                            extensive lavage, suction. The colonoscopy was                            performed without difficulty. The patient tolerated                            the procedure well. Scope In: 12:45:35 PM Scope Out: 1:06:42 PM Scope Withdrawal Time: 0 hours 17 minutes 50 seconds  Total Procedure Duration: 0 hours 21 minutes 7 seconds  Findings:      The perianal and digital rectal examinations were normal.      A 15 mm polyp was found in the transverse colon in between the  2       tattoos. The polyp was sessile. The polyp was removed with a piecemeal       technique using a cold snare. Resection and retrieval were complete.      Two tattoos were seen in the transverse colon. The tattoo sites appeared       normal.      Six sessile polyps were found in the rectum, sigmoid colon, descending       colon and transverse colon (3). The polyps were 7 to 10 mm in size.       These polyps were removed with a cold snare. Resection and retrieval       were complete.      Multiple medium-mouthed diverticula were found in the left colon.      Internal hemorrhoids were found during retroflexion. The hemorrhoids       were moderate and Grade I (internal hemorrhoids that do not prolapse).      The exam was otherwise without abnormality on  direct and retroflexion       views. Impression:               - One 15 mm polyp in the transverse colon, removed                            piecemeal using a cold snare, located in between                            the 2 tatoos. Resected and retrieved.                           - Two tattoos were seen in the transverse colon.                            The tattoo sites appeared normal.                           - Six 7 to 10 mm polyps in the rectum, in the                            sigmoid colon, in the descending colon and in the                            transverse colon, removed with a cold snare.                            Resected and retrieved.                           - Diverticulosis in the left colon.                           - Internal hemorrhoids.                           - The examination was otherwise normal on direct  and retroflexion views. Moderate Sedation:      Not Applicable - Patient had care per Anesthesia. Recommendation:           - Repeat colonoscopy, likely 1 year, after studies                            are complete for surveillance based on pathology                            results with a more extensive bowel prep.                           - Patient has a contact number available for                            emergencies. The signs and symptoms of potential                            delayed complications were discussed with the                            patient. Return to normal activities tomorrow.                            Written discharge instructions were provided to the                            patient.                           - Resume previous diet.                           - Continue present medications.                           - Await pathology results.                           - No aspirin, ibuprofen, naproxen, or other                            non-steroidal anti-inflammatory drugs for 2 weeks                             after polyp removal. Procedure Code(s):        --- Professional ---                           [phone], Colonoscopy, flexible; with removal of                            tumor(s), polyp(s), or other lesion(s) by snare                            technique Diagnosis Code(s):        ---  Professional ---                           Z86.010, Personal history of colonic polyps                           D12.3, Benign neoplasm of transverse colon (hepatic                            flexure or splenic flexure)                           K64.0, First degree hemorrhoids                           D12.8, Benign neoplasm of rectum                           D12.5, Benign neoplasm of sigmoid colon                           D12.4, Benign neoplasm of descending colon                           K57.30, Diverticulosis of large intestine without                            perforation or abscess without bleeding CPT copyright 2022 American Medical Association. All rights reserved. The codes documented in this report are preliminary and upon coder review may  be revised to meet current compliance requirements. Ladene Artist, MD 03/13/2022 1:15:41 PM This report has been signed electronically. Number of Addenda: 0

## 2022-03-13 NOTE — Transfer of Care (Signed)
Immediate Anesthesia Transfer of Care Note  Patient: Jose Ware  Procedure(s) Performed: COLONOSCOPY WITH PROPOFOL POLYPECTOMY  Patient Location: Endoscopy Unit  Anesthesia Type:MAC  Level of Consciousness: drowsy  Airway & Oxygen Therapy: Patient Spontanous Breathing and Patient connected to face mask oxygen  Post-op Assessment: Report given to RN and Post -op Vital signs reviewed and stable  Post vital signs: Reviewed and stable  Last Vitals:  Vitals Value Taken Time  BP    Temp    Pulse    Resp    SpO2      Last Pain:  Vitals:   03/13/22 1150  TempSrc: Temporal  PainSc: 0-No pain         Complications: No notable events documented.

## 2022-03-14 LAB — SURGICAL PATHOLOGY

## 2022-03-17 ENCOUNTER — Encounter: Payer: Self-pay | Admitting: Gastroenterology

## 2022-03-17 ENCOUNTER — Encounter (HOSPITAL_COMMUNITY): Payer: Self-pay | Admitting: Gastroenterology

## 2022-03-18 ENCOUNTER — Telehealth: Payer: Self-pay | Admitting: Gastroenterology
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
## 2022-03-18 NOTE — Telephone Encounter (Signed)
Patient called in with complaints of intermittent, RLQ pain that is tender to the touch (9/10). The pain started Sunday night 03/16/22, and has not improved. Pain worsens when he stands up & walks around. Denies any n/v, or bleeding. Bowel movements are loose which is normal for him. Tolerating diet okay, eating mostly soup. He had a colon at [facility] on 03/13/22 with Dr. Fuller Plan. Will route to MD to make him aware.

## 2022-03-18 NOTE — Telephone Encounter (Signed)
Inbound call from patient stating that he had a colonoscopy on 1/4 with Dr. Fuller Plan at the hospital and has since started having sharp pains in his stomach. Patient is requesting a call back to discuss. Please advise.

## 2022-03-18 NOTE — Telephone Encounter (Signed)
Colonoscopy with 7 cold polypectomies (transverse, descending, sigmoid, rectum) on 1/4. Left colon diverticulosis noted. RLQ pain started on 1/7 and persists. Pain is unlikely to be related to colonoscopy polypectomies however it is possible. Etiology unclear. Possible diverticulitis. Recommend urgent ED evaluation.

## 2022-03-19 ENCOUNTER — Emergency Department (HOSPITAL_COMMUNITY): Payer: Medicare Other

## 2022-03-19 ENCOUNTER — Emergency Department (HOSPITAL_COMMUNITY)
Admission: EM | Admit: 2022-03-19 | Discharge: 2022-03-19 | Disposition: A | Discharge status: Home / Self Care | Payer: Medicare Other | Source: Home / Self Care | Attending: Student | Admitting: Student

## 2022-03-19 ENCOUNTER — Encounter (HOSPITAL_COMMUNITY): Payer: Self-pay

## 2022-03-19 DIAGNOSIS — Z91030 Bee allergy status: Secondary | ICD-10-CM | POA: Diagnosis not present

## 2022-03-19 DIAGNOSIS — Z8546 Personal history of malignant neoplasm of prostate: Secondary | ICD-10-CM | POA: Insufficient documentation

## 2022-03-19 DIAGNOSIS — M19031 Primary osteoarthritis, right wrist: Secondary | ICD-10-CM | POA: Diagnosis not present

## 2022-03-19 DIAGNOSIS — Z8719 Personal history of other diseases of the digestive system: Secondary | ICD-10-CM | POA: Diagnosis not present

## 2022-03-19 DIAGNOSIS — Z1152 Encounter for screening for COVID-19: Secondary | ICD-10-CM | POA: Insufficient documentation

## 2022-03-19 DIAGNOSIS — Z8679 Personal history of other diseases of the circulatory system: Secondary | ICD-10-CM | POA: Insufficient documentation

## 2022-03-19 DIAGNOSIS — C7951 Secondary malignant neoplasm of bone: Secondary | ICD-10-CM | POA: Diagnosis not present

## 2022-03-19 DIAGNOSIS — E860 Dehydration: Secondary | ICD-10-CM | POA: Diagnosis not present

## 2022-03-19 DIAGNOSIS — K529 Noninfective gastroenteritis and colitis, unspecified: Secondary | ICD-10-CM

## 2022-03-19 DIAGNOSIS — E785 Hyperlipidemia, unspecified: Secondary | ICD-10-CM | POA: Diagnosis not present

## 2022-03-19 DIAGNOSIS — K5730 Diverticulosis of large intestine without perforation or abscess without bleeding: Secondary | ICD-10-CM | POA: Diagnosis not present

## 2022-03-19 DIAGNOSIS — I2510 Atherosclerotic heart disease of native coronary artery without angina pectoris: Secondary | ICD-10-CM | POA: Diagnosis not present

## 2022-03-19 DIAGNOSIS — R1084 Generalized abdominal pain: Secondary | ICD-10-CM | POA: Diagnosis not present

## 2022-03-19 DIAGNOSIS — Z7982 Long term (current) use of aspirin: Secondary | ICD-10-CM | POA: Insufficient documentation

## 2022-03-19 DIAGNOSIS — K5669 Other partial intestinal obstruction: Secondary | ICD-10-CM | POA: Diagnosis not present

## 2022-03-19 DIAGNOSIS — C8104 Nodular lymphocyte predominant Hodgkin lymphoma, lymph nodes of axilla and upper limb: Secondary | ICD-10-CM | POA: Diagnosis not present

## 2022-03-19 DIAGNOSIS — Z9889 Other specified postprocedural states: Secondary | ICD-10-CM | POA: Diagnosis not present

## 2022-03-19 DIAGNOSIS — Z881 Allergy status to other antibiotic agents status: Secondary | ICD-10-CM | POA: Diagnosis not present

## 2022-03-19 DIAGNOSIS — Z803 Family history of malignant neoplasm of breast: Secondary | ICD-10-CM | POA: Diagnosis not present

## 2022-03-19 DIAGNOSIS — E871 Hypo-osmolality and hyponatremia: Secondary | ICD-10-CM | POA: Diagnosis not present

## 2022-03-19 DIAGNOSIS — G4733 Obstructive sleep apnea (adult) (pediatric): Secondary | ICD-10-CM | POA: Diagnosis not present

## 2022-03-19 DIAGNOSIS — R1030 Lower abdominal pain, unspecified: Secondary | ICD-10-CM | POA: Diagnosis not present

## 2022-03-19 DIAGNOSIS — Z4682 Encounter for fitting and adjustment of non-vascular catheter: Secondary | ICD-10-CM | POA: Diagnosis not present

## 2022-03-19 DIAGNOSIS — N390 Urinary tract infection, site not specified: Secondary | ICD-10-CM | POA: Diagnosis not present

## 2022-03-19 DIAGNOSIS — Z79899 Other long term (current) drug therapy: Secondary | ICD-10-CM | POA: Insufficient documentation

## 2022-03-19 DIAGNOSIS — C8293 Follicular lymphoma, unspecified, intra-abdominal lymph nodes: Secondary | ICD-10-CM | POA: Diagnosis not present

## 2022-03-19 DIAGNOSIS — E876 Hypokalemia: Secondary | ICD-10-CM | POA: Diagnosis not present

## 2022-03-19 DIAGNOSIS — K56600 Partial intestinal obstruction, unspecified as to cause: Secondary | ICD-10-CM | POA: Diagnosis not present

## 2022-03-19 DIAGNOSIS — I10 Essential (primary) hypertension: Secondary | ICD-10-CM | POA: Insufficient documentation

## 2022-03-19 DIAGNOSIS — E782 Mixed hyperlipidemia: Secondary | ICD-10-CM | POA: Diagnosis not present

## 2022-03-19 DIAGNOSIS — K5651 Intestinal adhesions [bands], with partial obstruction: Secondary | ICD-10-CM | POA: Diagnosis not present

## 2022-03-19 DIAGNOSIS — N281 Cyst of kidney, acquired: Secondary | ICD-10-CM | POA: Diagnosis not present

## 2022-03-19 DIAGNOSIS — M419 Scoliosis, unspecified: Secondary | ICD-10-CM | POA: Diagnosis not present

## 2022-03-19 DIAGNOSIS — K567 Ileus, unspecified: Secondary | ICD-10-CM | POA: Diagnosis not present

## 2022-03-19 DIAGNOSIS — R1031 Right lower quadrant pain: Secondary | ICD-10-CM

## 2022-03-19 DIAGNOSIS — C8263 Cutaneous follicle center lymphoma, intra-abdominal lymph nodes: Secondary | ICD-10-CM | POA: Diagnosis not present

## 2022-03-19 DIAGNOSIS — K56609 Unspecified intestinal obstruction, unspecified as to partial versus complete obstruction: Secondary | ICD-10-CM | POA: Diagnosis not present

## 2022-03-19 DIAGNOSIS — Z888 Allergy status to other drugs, medicaments and biological substances status: Secondary | ICD-10-CM | POA: Diagnosis not present

## 2022-03-19 DIAGNOSIS — Z87891 Personal history of nicotine dependence: Secondary | ICD-10-CM | POA: Insufficient documentation

## 2022-03-19 DIAGNOSIS — I5032 Chronic diastolic (congestive) heart failure: Secondary | ICD-10-CM | POA: Diagnosis not present

## 2022-03-19 DIAGNOSIS — Z8673 Personal history of transient ischemic attack (TIA), and cerebral infarction without residual deficits: Secondary | ICD-10-CM | POA: Insufficient documentation

## 2022-03-19 DIAGNOSIS — K6389 Other specified diseases of intestine: Secondary | ICD-10-CM | POA: Diagnosis not present

## 2022-03-19 DIAGNOSIS — Z807 Family history of other malignant neoplasms of lymphoid, hematopoietic and related tissues: Secondary | ICD-10-CM | POA: Diagnosis not present

## 2022-03-19 DIAGNOSIS — Z9049 Acquired absence of other specified parts of digestive tract: Secondary | ICD-10-CM | POA: Diagnosis not present

## 2022-03-19 DIAGNOSIS — Z811 Family history of alcohol abuse and dependence: Secondary | ICD-10-CM | POA: Diagnosis not present

## 2022-03-19 DIAGNOSIS — I255 Ischemic cardiomyopathy: Secondary | ICD-10-CM | POA: Diagnosis not present

## 2022-03-19 DIAGNOSIS — I110 Hypertensive heart disease with heart failure: Secondary | ICD-10-CM | POA: Diagnosis not present

## 2022-03-19 LAB — LIPASE, BLOOD: Lipase: 29 U/L (ref 11–51)

## 2022-03-19 LAB — URINALYSIS, ROUTINE W REFLEX MICROSCOPIC
Bilirubin Urine: NEGATIVE
Glucose, UA: NEGATIVE mg/dL
Hgb urine dipstick: NEGATIVE
Ketones, ur: NEGATIVE mg/dL
Leukocytes,Ua: NEGATIVE
Nitrite: NEGATIVE
Protein, ur: NEGATIVE mg/dL
Specific Gravity, Urine: 1.034 — ABNORMAL HIGH (ref 1.005–1.030)
pH: 5 (ref 5.0–8.0)

## 2022-03-19 LAB — COMPREHENSIVE METABOLIC PANEL
ALT: 12 U/L (ref 0–44)
AST: 14 U/L — ABNORMAL LOW (ref 15–41)
Albumin: 3.9 g/dL (ref 3.5–5.0)
Alkaline Phosphatase: 71 U/L (ref 38–126)
Anion gap: 9 (ref 5–15)
BUN: 30 mg/dL — ABNORMAL HIGH (ref 8–23)
CO2: 24 mmol/L (ref 22–32)
Calcium: 9.1 mg/dL (ref 8.9–10.3)
Chloride: 103 mmol/L (ref 98–111)
Creatinine, Ser: 1.1 mg/dL (ref 0.61–1.24)
GFR, Estimated: >60 mL/min (ref >60)
Glucose, Bld: 155 mg/dL — ABNORMAL HIGH (ref 70–99)
Potassium: 3.9 mmol/L (ref 3.5–5.1)
Sodium: 136 mmol/L (ref 135–145)
Total Bilirubin: 1.7 mg/dL — ABNORMAL HIGH (ref 0.3–1.2)
Total Protein: 7.3 g/dL (ref 6.5–8.1)

## 2022-03-19 LAB — RESP PANEL BY RT-PCR (RSV, FLU A&B, COVID)  RVPGX2
Influenza A by PCR: NEGATIVE
Influenza B by PCR: NEGATIVE
Resp Syncytial Virus by PCR: NEGATIVE
SARS Coronavirus 2 by RT PCR: NEGATIVE

## 2022-03-19 LAB — CBC
HCT: 40.8 % (ref 39.0–52.0)
Hemoglobin: 13.5 g/dL (ref 13.0–17.0)
MCH: 32.1 pg (ref 26.0–34.0)
MCHC: 33.1 g/dL (ref 30.0–36.0)
MCV: 96.9 fL (ref 80.0–100.0)
Platelets: 249 10*3/uL (ref 150–400)
RBC: 4.21 MIL/uL — ABNORMAL LOW (ref 4.22–5.81)
RDW: 14.5 % (ref 11.5–15.5)
WBC: 12.3 10*3/uL — ABNORMAL HIGH (ref 4.0–10.5)
nRBC: 0 % (ref 0.0–0.2)

## 2022-03-19 MED ORDER — ONDANSETRON HCL 4 MG/2ML IJ SOLN
4.0000 mg | Freq: Once | INTRAMUSCULAR | Status: AC
  Administered 2022-03-19: 4 mg via INTRAVENOUS
  Filled 2022-03-19: qty 2

## 2022-03-19 MED ORDER — DICYCLOMINE HCL 20 MG PO TABS: 20.0000 mg | ORAL_TABLET | Freq: Two times a day (BID) | ORAL | 0 refills | Status: DC | PRN

## 2022-03-19 MED ORDER — SODIUM CHLORIDE (PF) 0.9 % IJ SOLN
INTRAMUSCULAR | Status: AC
  Filled 2022-03-19: qty 50

## 2022-03-19 MED ORDER — LACTATED RINGERS IV BOLUS
500.0000 mL | Freq: Once | INTRAVENOUS | Status: AC
  Administered 2022-03-19: 500 mL via INTRAVENOUS

## 2022-03-19 MED ORDER — MORPHINE SULFATE (PF) 2 MG/ML IV SOLN
2.0000 mg | Freq: Once | INTRAVENOUS | Status: AC
  Administered 2022-03-19: 2 mg via INTRAVENOUS
  Filled 2022-03-19: qty 1

## 2022-03-19 MED ORDER — IOHEXOL 300 MG/ML  SOLN
100.0000 mL | Freq: Once | INTRAMUSCULAR | Status: AC | PRN
  Administered 2022-03-19: 100 mL via INTRAVENOUS

## 2022-03-19 NOTE — ED Notes (Signed)
Patient transported to CT 

## 2022-03-19 NOTE — ED Triage Notes (Signed)
Pt presents with c/o abdominal pain since Sunday. Pt reports he had a colonoscopy last Thursday and started having pain on Sunday with nausea that started today.

## 2022-03-19 NOTE — ED Provider Notes (Signed)
[facility] DEPT Provider Note  CSN: [id number] Arrival date & time: 03/19/22 9381  Chief Complaint(s) Abdominal Pain  HPI SAI ZINN is a 88 y.o. male with PMH prostate cancer status post radiation, CAD with DES placement, CVA, multiple abdominal surgeries with previous colostomy placement and subsequent reversal, non-Hodgkin's lymphoma not currently on chemotherapy who presents emergency department for evaluation of abdominal pain.  Patient had a colonoscopy performed on 03/13/2022 and has been doing well up until this morning when he had acute onset right lower quadrant pain.  He has associated nausea but no vomiting.  Denies chest pain, shortness of breath, headache, fever or other systemic symptoms.  He states that this feels similar to the last time he had a bowel obstruction.   Past Medical History Past Medical History:  Diagnosis Date   Adenocarcinoma of prostate ([place])    XRT & Radiation seed implantation in 2010   Adenomatous colon polyp    CAD (coronary artery disease)    a. 11/04/15:  Acute inferolateral STEMI: S/p emergent DES of a very large LCx with extensive thrombus. Significant residual disease in the proximal LAD and distal RCA. S/p staged PCI of LAD and RCA 8/29.   Cataract    CVA (cerebral infarction)    a. 10/2991: embolic CVA after heart cath    CVA (cerebral infarction)    a. 09/1694: embolic CVA after heart cath    DIVERTICULITIS, HX OF 11/27/2006        DJD (degenerative joint disease)    wrist - R    Hernia    History of blood transfusion 1985   with colon surgery   History of kidney stones    Hypertension    Ischemic cardiomyopathy    a. EF 25-30% by LV gram on cath 11/04/15. Appeared out of proportion to infarct although infarct was large. EF improved by Echo and now 40-45%.   Myocardial infarction ([place]) 2019   NEPHROLITHIASIS, HX OF 11/27/2006         Non Hodgkin's lymphoma ([place]) 11/2020   watching at this point, in  abdomen, no meds or chemo yet as of 01-27-2022   Peripheral neuropathy    pt denies   Pneumonia    as a child   Psoriasis    Sleep apnea    wears cpap   Stroke [facility])    no residuals   Tenosynovitis    Patient Active Problem List   Diagnosis Date Noted   Benign neoplasm of transverse colon 03/13/2022   Benign neoplasm of descending colon 03/13/2022   Benign neoplasm of sigmoid colon 03/13/2022   Benign neoplasm of rectum 78/93/8101   Follicular lymphoma ([place]) 04/11/2021   History of small bowel obstruction 11/13/2020   OSA (obstructive sleep apnea) 05/08/2020   Pain of back and left lower extremity 06/08/2019   Left knee pain 06/06/2019   Onychomycosis 10/28/2017   Spondylosis 06/25/2017   Post herpetic neuralgia 06/20/2017   Spinal stenosis of lumbar region at multiple levels 04/30/2017   Drug reaction 02/05/2017   Senile purpura ([place]) 02/05/2017   Dyslipidemia 11/16/2015   History of ventricular tachycardia 11/08/2015   CAD S/P PCI    Ischemic cardiomyopathy    History of embolic stroke    Post-traumatic osteoarthritis of right wrist 04/25/2015   Hx of adenomatous colonic polyps 01/18/2015   History of primary hyperparathyroidism s/p parathyroidectomy (benign adenoma) 12/28/2013   History of stomach cancer 12/01/2013   Fatigue 12/01/2013   Lower back  pain 03/24/2013   Bradycardia 09/23/2012   B12 deficiency 09/17/2010   History of prostate cancer 12/14/2008   PERIPHERAL NEUROPATHY 10/18/2007   PSORIASIS 10/15/2007   Essential hypertension 11/27/2006   DEGENERATIVE JOINT DISEASE 11/27/2006   NEPHROLITHIASIS, HX OF 11/27/2006   Home Medication(s) Prior to Admission medications   Medication Sig Start Date End Date Taking? Authorizing Provider  dicyclomine (BENTYL) 20 MG tablet Take 1 tablet (20 mg total) by mouth 2 (two) times daily as needed for spasms. 03/19/22  Yes Zevin Nevares, MD  amLODipine (NORVASC) 5 MG tablet Take 1 tablet by mouth once daily 03/11/22    [name], MD  aspirin EC 81 MG tablet Take 1 tablet (81 mg total) by mouth daily. 11/16/15   Martinique, Peter M, MD  atorvastatin (LIPITOR) 10 MG tablet Take 1 tablet (10 mg total) by mouth every other day. 01/10/22   [name], MD  carvedilol (COREG) 3.125 MG tablet Take 1 tablet by mouth twice daily 02/21/22   Martinique, Peter M, MD  Cyanocobalamin (VITAMIN B-12) 5000 MCG TBDP Take 5,000 mcg by mouth every morning.    [provider]  losartan (COZAAR) 100 MG tablet Take 1 tablet by mouth once daily 07/22/21   Martinique, Peter M, MD  Multiple Vitamins-Minerals (OCUVITE ADULT 50+ PO) Take 1 tablet by mouth daily. 03/11/19   [provider]  nitroGLYCERIN (NITROSTAT) 0.4 MG SL tablet Place 1 tablet (0.4 mg total) under the tongue every 5 (five) minutes x 3 doses as needed for chest pain. 12/27/20   [name], MD  PREVIDENT 5000 DRY MOUTH 1.1 % GEL dental gel Place 1 Application onto teeth at bedtime. 11/20/21   [provider]  spironolactone (ALDACTONE) 25 MG tablet Take 1 tablet by mouth once daily 12/30/21   Martinique, Peter M, MD                                                                                                                                    Past Surgical History Past Surgical History:  Procedure Laterality Date   APPENDECTOMY  03/11/1983   BACK SURGERY  03/10/1978   minor disk surgery: instrumentation placed and removed in a second procedure   CARDIAC CATHETERIZATION N/A 11/04/2015   Procedure: Left Heart Cath and Coronary Angiography;  Surgeon: Peter M Martinique, MD;  Location: [facility];  Service: Cardiovascular;  Laterality: N/A;   CARDIAC CATHETERIZATION N/A 11/04/2015   Procedure: Coronary Stent Intervention;  Surgeon: Peter M Martinique, MD;  Location: [facility];  Service: Cardiovascular;  Laterality: N/A;   CARDIAC CATHETERIZATION N/A 11/06/2015   Procedure: Coronary Stent Intervention;  Surgeon: Leonie Man, MD;   Location: [place] [facility];  Service: Cardiovascular;  Laterality: N/A;   CATARACT EXTRACTION, BILATERAL  03/11/2011   CHOLECYSTECTOMY  03/10/1984   COLON SURGERY  11/09/1983   pt. had ileus, had colon surgery,  requiring colostomy & then reversal & then dehisence of that wound & return to OR for repair & cholecystectomy     COLONOSCOPY  05/28/17   COLONOSCOPY WITH PROPOFOL N/A 03/13/2022   Procedure: COLONOSCOPY WITH PROPOFOL;  Surgeon: Ladene Artist, MD;  Location: [facility];  Service: Gastroenterology;  Laterality: N/A;   COLOSTOMY  03/11/1983   After colectomy for diverticulitis, pt. remarks during this surgery they "gave me the paddles two times  because of bleeding", pt. states it was unrelated to any anesthesia complication   EYE SURGERY     /w IOL   KNEE ARTHROSCOPY Left 03/10/2001   KNEE SURGERY Left 03/10/1954   Left 29-May-1954, Right w/cartilage removed later   PARATHYROIDECTOMY N/A 05/12/2014   Procedure: PARATHYROIDECTOMY;  Surgeon: Armandina Gemma, MD;  Location: [facility];  Service: General;  Laterality: N/A;   POLYPECTOMY     POLYPECTOMY  03/13/2022   Procedure: POLYPECTOMY;  Surgeon: Ladene Artist, MD;  Location: [facility];  Service: Gastroenterology;;   REVERSAL OF COLOSTOMY  03/10/1985   TONSILLECTOMY  03/10/1944   WRIST SURGERY Left    Remote complicated w/infection   Family History Family History  Problem Relation Age of Onset   Coronary artery disease Mother    Alcohol abuse Father    Cirrhosis Father    Heart failure Sister    Breast cancer Sister        W/involvement of right arm leading to amputation   Anemia Sister        related to treatment to lymphoma   Non-Hodgkin's lymphoma Sister    Other Sister        died from covid 05-28-2020   Coronary artery disease Brother    Heart attack Brother    Non-Hodgkin's lymphoma Brother    Clotting disorder Brother    Non-Hodgkin's lymphoma Brother    Prostate cancer Neg Hx    Colon cancer Neg Hx    Diabetes Neg Hx     Glaucoma Neg Hx    Rectal cancer Neg Hx    Esophageal cancer Neg Hx    Colon polyps Neg Hx    Stomach cancer Neg Hx     Social History Social History   Tobacco Use   Smoking status: Former    Packs/day: 2.00    Years: 30.00    Total pack years: 60.00    Types: Cigarettes    Quit date: 03/11/1983    Years since quitting: 39.0   Smokeless tobacco: Never   Tobacco comments:    started smoking in the service; ICU 05/29/1983' part of his stomach; appendix; coma   Vaping Use   Vaping Use: Never used  Substance Use Topics   Alcohol use: Not Currently    Comment: RARE, maybe one drink a couple times a year   Drug use: Never   Allergies Bee venom, Betadine [povidone iodine], and Doxycycline  Review of Systems Review of Systems  Gastrointestinal:  Positive for abdominal pain and nausea.    Physical Exam Vital Signs  I have reviewed the triage vital signs BP 139/71 (BP Location: Left Arm)   Pulse (!) 55   Temp 98 F (36.7 C) (Oral)   Resp 16   Ht '6\' 1"'$  (1.854 m)   Wt 96.6 kg   SpO2 91%   BMI 28.10 kg/m   Physical Exam Constitutional:      General: He is not in acute distress.    Appearance: Normal appearance.  HENT:  Head: Normocephalic and atraumatic.     Nose: No congestion or rhinorrhea.  Eyes:     General:        Right eye: No discharge.        Left eye: No discharge.     Extraocular Movements: Extraocular movements intact.     Pupils: Pupils are equal, round, and reactive to light.  Cardiovascular:     Rate and Rhythm: Normal rate and regular rhythm.     Heart sounds: No murmur heard. Pulmonary:     Effort: No respiratory distress.     Breath sounds: No wheezing or rales.  Abdominal:     General: There is no distension.     Tenderness: There is abdominal tenderness in the right lower quadrant.  Musculoskeletal:        General: Normal range of motion.     Cervical back: Normal range of motion.  Skin:    General: Skin is warm and dry.  Neurological:      General: No focal deficit present.     Mental Status: He is alert.     ED Results and Treatments Labs (all labs ordered are listed, but only abnormal results are displayed) Labs Reviewed  COMPREHENSIVE METABOLIC PANEL - Abnormal; Notable for the following components:      Result Value   Glucose, Bld 155 (*)    BUN 30 (*)    AST 14 (*)    Total Bilirubin 1.7 (*)    All other components within normal limits  CBC - Abnormal; Notable for the following components:   WBC 12.3 (*)    RBC 4.21 (*)    All other components within normal limits  URINALYSIS, ROUTINE W REFLEX MICROSCOPIC - Abnormal; Notable for the following components:   Specific Gravity, Urine 1.034 (*)    All other components within normal limits  RESP PANEL BY RT-PCR (RSV, FLU A&B, COVID)  RVPGX2  LIPASE, BLOOD                                                                                                                          Radiology CT ABDOMEN PELVIS W CONTRAST  Result Date: 03/19/2022 CLINICAL DATA:  Right lower quadrant abdominal pain. EXAM: CT ABDOMEN AND PELVIS WITH CONTRAST TECHNIQUE: Multidetector CT imaging of the abdomen and pelvis was performed using the standard protocol following bolus administration of intravenous contrast. RADIATION DOSE REDUCTION: This exam was performed according to the departmental dose-optimization program which includes automated exposure control, adjustment of the mA and/or kV according to patient size and/or use of iterative reconstruction technique. CONTRAST:  177m OMNIPAQUE IOHEXOL 300 MG/ML  SOLN COMPARISON:  CT of the abdomen and pelvis with contrast 07/15/2021. FINDINGS: Lower chest: Mild dependent atelectasis is present. Lungs are otherwise clear. Heart size is upper limits of normal. Coronary artery calcifications are present. Hepatobiliary: Mild intra and extrahepatic biliary dilation has progressed no focal obstruction is present. There may be some mass effect from the  4 cm  duodenal diverticulum. No inflammatory changes are associated. Pancreas: Unremarkable. No pancreatic ductal dilatation or surrounding inflammatory changes. Spleen: Normal in size without focal abnormality. Adrenals/Urinary Tract: Adrenal glands are within normal limits bilaterally. Bilateral simple cysts of both kidneys scratched at bilateral simple cysts are stable. No follow-up imaging is recommended. No solid lesions are present. No stone or obstruction is present. Ureters are within normal limits bilaterally. The urinary bladder is unremarkable. Stomach/Bowel: The stomach is otherwise within normal limits. The duodenal diverticulum is stable. Proximal small bowel is within normal limits. Small lymph nodes in some edema are present throughout the mesentery. Fluid levels are present within nondilated loops of bowel. Loops are present in the right lower quadrant adjacent to the peritoneum with some wall thickening and surrounding inflammatory changes. No obstruction is present. Terminal ileum is within normal limits. Appendix is not discretely visualized and may be surgically absent. The ascending and transverse colon are within normal limits. Surgical clips are noted in the left upper quadrant. Descending colon is normal. Sigmoid anastomosis is intact. Sigmoid colon is within normal limits. Vascular/Lymphatic: Atherosclerotic calcifications are present in the aorta and branch vessels without aneurysm. Enlarged retroperitoneal lymph nodes are stable. Previously measured left periaortic nodal cluster is stable measuring 4.6 x 3.6 cm on image 34 of series 2. a right periaortic node on image 35 of series 2 is also stable. No new or enlarging nodes are present. Reproductive: Brachy therapy CT noted in the prostate gland. Seminal vesicles are within normal limits. Other: No significant ventral hernias are present. No free fluid or free air is present. Previous anterior abdominal scars are noted. Musculoskeletal: Lucent  lesion at L2 is stable and chronic. Subcortical lucencies at L3 and L4 likely degenerative. These are also chronic. Remote superior endplate fracture of L1 is noted. No new lesions are present. Degenerative changes are noted at the ischial tuberosities bilaterally. Degenerative changes of the hips are stable IMPRESSION: 1. Fluid levels within nondilated loops of bowel with some wall thickening and surrounding inflammatory changes in the right lower quadrant adjacent to the peritoneum. This is concerning for a nonspecific enteritis. Adhesions may contribute. No obstruction is present. 2. Enlarged retroperitoneal lymph nodes are stable, consistent with the history of lymphoma. No evidence for disease progression. 3. Stable 4 cm duodenal diverticulum. 4. Coronary artery disease. 5. Mild intra and extrahepatic biliary dilation has progressed no focal obstruction is present. There may be some mass effect from the 4 cm duodenal diverticulum. No inflammatory changes are associated. 6.  Aortic Atherosclerosis (ICD10-I70.0). Electronically Signed   By: San Morelle M.D.   On: 03/19/2022 11:14   DG Abdomen 1 View  Result Date: 03/19/2022 CLINICAL DATA:  Concern for bowel obstruction. Lower abdominal pain. EXAM: ABDOMEN - 1 VIEW COMPARISON:  CT abdomen and pelvis 07/15/2021 FINDINGS: Scattered bowel gas throughout the abdomen and pelvis. There is gas within the small and large bowel. Nonobstructive bowel gas pattern. Bowel postsurgical changes in the pelvis and brachytherapy seeds in the prostate. Mild levoscoliosis in lumbar spine with multilevel degenerative changes in lumbar spine. Surgical clips in the upper abdomen. Chronic calcification in the medial right abdomen. IMPRESSION: 1. Nonobstructive bowel gas pattern. 2. Postsurgical changes in the abdomen and pelvis. Electronically Signed   By: Markus Daft M.D.   On: 03/19/2022 09:50    Pertinent labs & imaging results that were available during my care of the  patient were reviewed by me and considered in my medical decision making (see  MDM for details).  Medications Ordered in ED Medications  morphine (PF) 2 MG/ML injection 2 mg (2 mg Intravenous Given 03/19/22 0943)  lactated ringers bolus 500 mL (0 mLs Intravenous Stopped 03/19/22 1107)  ondansetron (ZOFRAN) injection 4 mg (4 mg Intravenous Given 03/19/22 0943)  iohexol (OMNIPAQUE) 300 MG/ML solution 100 mL (100 mLs Intravenous Contrast Given 03/19/22 1049)  sodium chloride (PF) 0.9 % injection (  Given by Other 03/19/22 1045)                                                                                                                                     Procedures Procedures  (including critical care time)  Medical Decision Making / ED Course   This patient presents to the ED for concern of abdominal pain, this involves an extensive number of treatment options, and is a complaint that carries with it a high risk of complications and morbidity.  The differential diagnosis includes obstruction, gastroenteritis, malignancy, diverticulitis  MDM: Patient seen emerged part for evaluation of abdominal pain.  Physical exam with significant tenderness in the right lower quadrant but is otherwise unremarkable.  Laboratory evaluation with a mild leukocytosis of 12.3 lites unremarkable.  Urinalysis negative.  COVID and flu negative.  CT abdomen pelvis obtained that does not show evidence of an obstruction but does show developing enteritis.  Patient was given a single dose of 2 mg of morphine and observed in the emergency department for more than 5 hours.  He was able to tolerate p.o. without difficulty here in the ER with no nausea or vomiting.  Patient was certainly could have a developing small bowel obstruction but given his improvement of pain, ability to tolerate p.o. without difficulty, we have shared decision making and decided that the patient will be discharged home with Bentyl and strict return  precautions of which he and his wife voiced understanding.  Patient then discharged with outpatient follow-up and return precautions   Additional history obtained: -Additional history obtained from wife -External records from outside source obtained and reviewed including: Chart review including previous notes, labs, imaging, consultation notes   Lab Tests: -I ordered, reviewed, and interpreted labs.   The pertinent results include:   Labs Reviewed  COMPREHENSIVE METABOLIC PANEL - Abnormal; Notable for the following components:      Result Value   Glucose, Bld 155 (*)    BUN 30 (*)    AST 14 (*)    Total Bilirubin 1.7 (*)    All other components within normal limits  CBC - Abnormal; Notable for the following components:   WBC 12.3 (*)    RBC 4.21 (*)    All other components within normal limits  URINALYSIS, ROUTINE W REFLEX MICROSCOPIC - Abnormal; Notable for the following components:   Specific Gravity, Urine 1.034 (*)    All other components within normal limits  RESP PANEL BY RT-PCR (RSV, FLU A&B, COVID)  RVPGX2  LIPASE, BLOOD     Imaging Studies ordered: I ordered imaging studies including CT abdomen pelvis, KUB I independently visualized and interpreted imaging. I agree with the radiologist interpretation   Medicines ordered and prescription drug management: Meds ordered this encounter  Medications   morphine (PF) 2 MG/ML injection 2 mg   lactated ringers bolus 500 mL   ondansetron (ZOFRAN) injection 4 mg   iohexol (OMNIPAQUE) 300 MG/ML solution 100 mL   sodium chloride (PF) 0.9 % injection    Antonieta Iba A: cabinet override   dicyclomine (BENTYL) 20 MG tablet    Sig: Take 1 tablet (20 mg total) by mouth 2 (two) times daily as needed for spasms.    Dispense:  20 tablet    Refill:  0    -I have reviewed the patients home medicines and have made adjustments as needed  Critical interventions none    Cardiac Monitoring: The patient was maintained on a  cardiac monitor.  I personally viewed and interpreted the cardiac monitored which showed an underlying rhythm of: NSR  Social Determinants of Health:  Factors impacting patients care include: none   Reevaluation: After the interventions noted above, I reevaluated the patient and found that they have :improved  Co morbidities that complicate the patient evaluation  Past Medical History:  Diagnosis Date   Adenocarcinoma of prostate ([place])    XRT & Radiation seed implantation in 2010   Adenomatous colon polyp    CAD (coronary artery disease)    a. 11/04/15:  Acute inferolateral STEMI: S/p emergent DES of a very large LCx with extensive thrombus. Significant residual disease in the proximal LAD and distal RCA. S/p staged PCI of LAD and RCA 8/29.   Cataract    CVA (cerebral infarction)    a. 09/8936: embolic CVA after heart cath    CVA (cerebral infarction)    a. 03/173: embolic CVA after heart cath    DIVERTICULITIS, HX OF 11/27/2006        DJD (degenerative joint disease)    wrist - R    Hernia    History of blood transfusion 1985   with colon surgery   History of kidney stones    Hypertension    Ischemic cardiomyopathy    a. EF 25-30% by LV gram on cath 11/04/15. Appeared out of proportion to infarct although infarct was large. EF improved by Echo and now 40-45%.   Myocardial infarction ([place]) 2019   NEPHROLITHIASIS, HX OF 11/27/2006         Non Hodgkin's lymphoma ([place]) 11/2020   watching at this point, in abdomen, no meds or chemo yet as of 01-27-2022   Peripheral neuropathy    pt denies   Pneumonia    as a child   Psoriasis    Sleep apnea    wears cpap   Stroke [facility])    no residuals   Tenosynovitis       Dispostion: I considered admission for this patient, but at this time he does not meet inpatient criteria for admission and he is safe for discharge with outpatient follow-up     Final Clinical Impression(s) / ED Diagnoses Final diagnoses:  Right lower quadrant  abdominal pain  Enteritis     '@PCDICTATION'$ @    Teressa Lower, MD 03/19/22 1457

## 2022-03-20 ENCOUNTER — Emergency Department (HOSPITAL_COMMUNITY): Payer: Medicare Other

## 2022-03-20 ENCOUNTER — Other Ambulatory Visit: Payer: Self-pay

## 2022-03-20 ENCOUNTER — Inpatient Hospital Stay (HOSPITAL_COMMUNITY)
Admission: EM | Admit: 2022-03-20 | Discharge: 2022-03-29 | DRG: 389 | Disposition: A | Discharge status: Home / Self Care | Payer: Medicare Other | Attending: Internal Medicine | Admitting: Internal Medicine

## 2022-03-20 ENCOUNTER — Encounter (HOSPITAL_COMMUNITY): Payer: Self-pay

## 2022-03-20 DIAGNOSIS — I5032 Chronic diastolic (congestive) heart failure: Secondary | ICD-10-CM | POA: Diagnosis present

## 2022-03-20 DIAGNOSIS — M19031 Primary osteoarthritis, right wrist: Secondary | ICD-10-CM | POA: Diagnosis present

## 2022-03-20 DIAGNOSIS — G4733 Obstructive sleep apnea (adult) (pediatric): Secondary | ICD-10-CM | POA: Diagnosis present

## 2022-03-20 DIAGNOSIS — K56609 Unspecified intestinal obstruction, unspecified as to partial versus complete obstruction: Secondary | ICD-10-CM | POA: Diagnosis not present

## 2022-03-20 DIAGNOSIS — I110 Hypertensive heart disease with heart failure: Secondary | ICD-10-CM | POA: Diagnosis present

## 2022-03-20 DIAGNOSIS — K567 Ileus, unspecified: Secondary | ICD-10-CM | POA: Diagnosis not present

## 2022-03-20 DIAGNOSIS — Z87891 Personal history of nicotine dependence: Secondary | ICD-10-CM

## 2022-03-20 DIAGNOSIS — N390 Urinary tract infection, site not specified: Secondary | ICD-10-CM | POA: Diagnosis present

## 2022-03-20 DIAGNOSIS — Z888 Allergy status to other drugs, medicaments and biological substances status: Secondary | ICD-10-CM

## 2022-03-20 DIAGNOSIS — R1031 Right lower quadrant pain: Secondary | ICD-10-CM | POA: Diagnosis present

## 2022-03-20 DIAGNOSIS — C7951 Secondary malignant neoplasm of bone: Secondary | ICD-10-CM | POA: Diagnosis present

## 2022-03-20 DIAGNOSIS — Z7982 Long term (current) use of aspirin: Secondary | ICD-10-CM

## 2022-03-20 DIAGNOSIS — E876 Hypokalemia: Secondary | ICD-10-CM | POA: Diagnosis not present

## 2022-03-20 DIAGNOSIS — E860 Dehydration: Secondary | ICD-10-CM | POA: Diagnosis not present

## 2022-03-20 DIAGNOSIS — C8293 Follicular lymphoma, unspecified, intra-abdominal lymph nodes: Secondary | ICD-10-CM | POA: Diagnosis not present

## 2022-03-20 DIAGNOSIS — Z1152 Encounter for screening for COVID-19: Secondary | ICD-10-CM

## 2022-03-20 DIAGNOSIS — Z803 Family history of malignant neoplasm of breast: Secondary | ICD-10-CM | POA: Diagnosis not present

## 2022-03-20 DIAGNOSIS — I10 Essential (primary) hypertension: Secondary | ICD-10-CM | POA: Diagnosis not present

## 2022-03-20 DIAGNOSIS — K5651 Intestinal adhesions [bands], with partial obstruction: Secondary | ICD-10-CM | POA: Diagnosis present

## 2022-03-20 DIAGNOSIS — E782 Mixed hyperlipidemia: Secondary | ICD-10-CM | POA: Diagnosis present

## 2022-03-20 DIAGNOSIS — Z832 Family history of diseases of the blood and blood-forming organs and certain disorders involving the immune mechanism: Secondary | ICD-10-CM

## 2022-03-20 DIAGNOSIS — C8263 Cutaneous follicle center lymphoma, intra-abdominal lymph nodes: Secondary | ICD-10-CM | POA: Diagnosis not present

## 2022-03-20 DIAGNOSIS — Z91030 Bee allergy status: Secondary | ICD-10-CM | POA: Diagnosis not present

## 2022-03-20 DIAGNOSIS — I252 Old myocardial infarction: Secondary | ICD-10-CM

## 2022-03-20 DIAGNOSIS — E785 Hyperlipidemia, unspecified: Secondary | ICD-10-CM | POA: Diagnosis present

## 2022-03-20 DIAGNOSIS — Z955 Presence of coronary angioplasty implant and graft: Secondary | ICD-10-CM

## 2022-03-20 DIAGNOSIS — Z8546 Personal history of malignant neoplasm of prostate: Secondary | ICD-10-CM

## 2022-03-20 DIAGNOSIS — C8590 Non-Hodgkin lymphoma, unspecified, unspecified site: Secondary | ICD-10-CM | POA: Diagnosis present

## 2022-03-20 DIAGNOSIS — Z811 Family history of alcohol abuse and dependence: Secondary | ICD-10-CM

## 2022-03-20 DIAGNOSIS — Z79899 Other long term (current) drug therapy: Secondary | ICD-10-CM

## 2022-03-20 DIAGNOSIS — I2510 Atherosclerotic heart disease of native coronary artery without angina pectoris: Secondary | ICD-10-CM | POA: Diagnosis present

## 2022-03-20 DIAGNOSIS — Z807 Family history of other malignant neoplasms of lymphoid, hematopoietic and related tissues: Secondary | ICD-10-CM

## 2022-03-20 DIAGNOSIS — Z8673 Personal history of transient ischemic attack (TIA), and cerebral infarction without residual deficits: Secondary | ICD-10-CM

## 2022-03-20 DIAGNOSIS — Z923 Personal history of irradiation: Secondary | ICD-10-CM

## 2022-03-20 DIAGNOSIS — Z881 Allergy status to other antibiotic agents status: Secondary | ICD-10-CM | POA: Diagnosis not present

## 2022-03-20 DIAGNOSIS — C8104 Nodular lymphocyte predominant Hodgkin lymphoma, lymph nodes of axilla and upper limb: Secondary | ICD-10-CM | POA: Diagnosis not present

## 2022-03-20 DIAGNOSIS — I255 Ischemic cardiomyopathy: Secondary | ICD-10-CM | POA: Diagnosis present

## 2022-03-20 DIAGNOSIS — E871 Hypo-osmolality and hyponatremia: Secondary | ICD-10-CM | POA: Diagnosis present

## 2022-03-20 DIAGNOSIS — Z8249 Family history of ischemic heart disease and other diseases of the circulatory system: Secondary | ICD-10-CM

## 2022-03-20 LAB — CBC
HCT: 40.3 % (ref 39.0–52.0)
Hemoglobin: 13.2 g/dL (ref 13.0–17.0)
MCH: 32 pg (ref 26.0–34.0)
MCHC: 32.8 g/dL (ref 30.0–36.0)
MCV: 97.6 fL (ref 80.0–100.0)
Platelets: 277 10*3/uL (ref 150–400)
RBC: 4.13 MIL/uL — ABNORMAL LOW (ref 4.22–5.81)
RDW: 14.4 % (ref 11.5–15.5)
WBC: 11.4 10*3/uL — ABNORMAL HIGH (ref 4.0–10.5)
nRBC: 0 % (ref 0.0–0.2)

## 2022-03-20 LAB — COMPREHENSIVE METABOLIC PANEL
ALT: 10 U/L (ref 0–44)
AST: 13 U/L — ABNORMAL LOW (ref 15–41)
Albumin: 3.6 g/dL (ref 3.5–5.0)
Alkaline Phosphatase: 68 U/L (ref 38–126)
Anion gap: 6 (ref 5–15)
BUN: 30 mg/dL — ABNORMAL HIGH (ref 8–23)
CO2: 24 mmol/L (ref 22–32)
Calcium: 8.8 mg/dL — ABNORMAL LOW (ref 8.9–10.3)
Chloride: 102 mmol/L (ref 98–111)
Creatinine, Ser: 1.21 mg/dL (ref 0.61–1.24)
GFR, Estimated: 59 mL/min — ABNORMAL LOW (ref >60)
Glucose, Bld: 117 mg/dL — ABNORMAL HIGH (ref 70–99)
Potassium: 3.9 mmol/L (ref 3.5–5.1)
Sodium: 132 mmol/L — ABNORMAL LOW (ref 135–145)
Total Bilirubin: 1.8 mg/dL — ABNORMAL HIGH (ref 0.3–1.2)
Total Protein: 7 g/dL (ref 6.5–8.1)

## 2022-03-20 LAB — LIPASE, BLOOD: Lipase: 30 U/L (ref 11–51)

## 2022-03-20 MED ORDER — DEXTROSE-NACL 5-0.9 % IV SOLN: INTRAVENOUS | Status: DC

## 2022-03-20 MED ORDER — ONDANSETRON HCL 4 MG/2ML IJ SOLN: 4.0000 mg | Freq: Four times a day (QID) | INTRAMUSCULAR | Status: DC | PRN

## 2022-03-20 MED ORDER — MORPHINE SULFATE (PF) 2 MG/ML IV SOLN
2.0000 mg | INTRAVENOUS | Status: DC | PRN
  Administered 2022-03-21: 2 mg via INTRAVENOUS
  Filled 2022-03-20: qty 1

## 2022-03-20 MED ORDER — MORPHINE SULFATE (PF) 4 MG/ML IV SOLN
4.0000 mg | Freq: Once | INTRAVENOUS | Status: AC
  Administered 2022-03-20: 4 mg via INTRAVENOUS
  Filled 2022-03-20: qty 1

## 2022-03-20 MED ORDER — ENOXAPARIN SODIUM 40 MG/0.4ML IJ SOSY
40.0000 mg | PREFILLED_SYRINGE | INTRAMUSCULAR | Status: DC
  Administered 2022-03-21 – 2022-03-29 (×9): 40 mg via SUBCUTANEOUS
  Filled 2022-03-20 (×9): qty 0.4

## 2022-03-20 MED ORDER — ONDANSETRON HCL 4 MG PO TABS
4.0000 mg | ORAL_TABLET | Freq: Four times a day (QID) | ORAL | Status: DC | PRN
  Administered 2022-03-26: 4 mg via ORAL
  Filled 2022-03-20: qty 1

## 2022-03-20 NOTE — ED Notes (Signed)
Pt unable to provide urine sample at this time 

## 2022-03-20 NOTE — ED Provider Notes (Signed)
[facility] Provider Note   CSN: [id number] Arrival date & time: 03/20/22  2018     History  Chief Complaint  Patient presents with   Abdominal Pain    Jose Ware is a 88 y.o. male.  HPI Presents with his wife who assists with the history.  He presents 1 day after being seen and evaluated at our affiliated facility, now with concern for persistent abdominal pain.  History is notable for multiple abdominal surgeries, including colostomy, now status post reversal.  In essence patient notes that since yesterday he has had persistent anorexia, attempts at p.o. intake have resulted in nausea, no actual vomiting.  No bowel movements, pain is diffuse    Home Medications Prior to Admission medications   Medication Sig Start Date End Date Taking? Authorizing Provider  amLODipine (NORVASC) 5 MG tablet Take 1 tablet by mouth once daily 03/11/22   [name], MD  aspirin EC 81 MG tablet Take 1 tablet (81 mg total) by mouth daily. 11/16/15   Martinique, Peter M, MD  atorvastatin (LIPITOR) 10 MG tablet Take 1 tablet (10 mg total) by mouth every other day. 01/10/22   [name], MD  carvedilol (COREG) 3.125 MG tablet Take 1 tablet by mouth twice daily 02/21/22   Martinique, Peter M, MD  Cyanocobalamin (VITAMIN B-12) 5000 MCG TBDP Take 5,000 mcg by mouth every morning.    [provider]  dicyclomine (BENTYL) 20 MG tablet Take 1 tablet (20 mg total) by mouth 2 (two) times daily as needed for spasms. 03/19/22   Kommor, Debe Coder, MD  losartan (COZAAR) 100 MG tablet Take 1 tablet by mouth once daily 07/22/21   Martinique, Peter M, MD  Multiple Vitamins-Minerals (OCUVITE ADULT 50+ PO) Take 1 tablet by mouth daily. 03/11/19   [provider]  nitroGLYCERIN (NITROSTAT) 0.4 MG SL tablet Place 1 tablet (0.4 mg total) under the tongue every 5 (five) minutes x 3 doses as needed for chest pain. 12/27/20   [name], MD  PREVIDENT 5000 DRY  MOUTH 1.1 % GEL dental gel Place 1 Application onto teeth at bedtime. 11/20/21   [provider]  spironolactone (ALDACTONE) 25 MG tablet Take 1 tablet by mouth once daily 12/30/21   Martinique, Peter M, MD      Allergies    Bee venom, Betadine [povidone iodine], and Doxycycline    Review of Systems   Review of Systems  All other systems reviewed and are negative.   Physical Exam Updated Vital Signs BP 126/73 (BP Location: Right Arm)   Pulse 62   Temp 97.7 F (36.5 C) (Oral)   Resp 17   Wt 90.5 kg   SpO2 95%   BMI 26.32 kg/m  Physical Exam Vitals and nursing note reviewed.  Constitutional:      General: He is not in acute distress.    Appearance: He is well-developed.  HENT:     Head: Normocephalic and atraumatic.  Eyes:     Conjunctiva/sclera: Conjunctivae normal.  Cardiovascular:     Rate and Rhythm: Normal rate and regular rhythm.  Pulmonary:     Effort: Pulmonary effort is normal. No respiratory distress.     Breath sounds: No stridor.  Abdominal:     General: There is no distension.     Tenderness: There is generalized abdominal tenderness. There is guarding.  Skin:    General: Skin is warm and dry.  Neurological:     Mental  Status: He is alert and oriented to person, place, and time.     ED Results / Procedures / Treatments   Labs (all labs ordered are listed, but only abnormal results are displayed) Labs Reviewed  COMPREHENSIVE METABOLIC PANEL - Abnormal; Notable for the following components:      Result Value   Sodium 132 (*)    Glucose, Bld 117 (*)    BUN 30 (*)    Calcium 8.8 (*)    AST 13 (*)    Total Bilirubin 1.8 (*)    GFR, Estimated 59 (*)    All other components within normal limits  CBC - Abnormal; Notable for the following components:   WBC 11.4 (*)    RBC 4.13 (*)    All other components within normal limits  LIPASE, BLOOD  URINALYSIS, ROUTINE W REFLEX MICROSCOPIC  CBC  CREATININE, SERUM  COMPREHENSIVE METABOLIC PANEL  CBC     EKG None  Radiology CT ABDOMEN PELVIS WO CONTRAST  Result Date: 03/20/2022 CLINICAL DATA:  Status post recent colonoscopy with subsequent worsening lower abdominal pain. EXAM: CT ABDOMEN AND PELVIS WITHOUT CONTRAST TECHNIQUE: Multidetector CT imaging of the abdomen and pelvis was performed following the standard protocol without IV contrast. RADIATION DOSE REDUCTION: This exam was performed according to the departmental dose-optimization program which includes automated exposure control, adjustment of the mA and/or kV according to patient size and/or use of iterative reconstruction technique. COMPARISON:  March 19, 2022 (10:49 a.m.) FINDINGS: Lower chest: No acute abnormality. Hepatobiliary: No focal liver abnormality is seen. Status post cholecystectomy. No biliary dilatation. Pancreas: Unremarkable. No pancreatic ductal dilatation or surrounding inflammatory changes. Spleen: Normal in size without focal abnormality. Adrenals/Urinary Tract: Stable diffuse bilateral adrenal gland enlargement is noted. Kidneys are normal in size, without renal calculi or hydronephrosis. Stable bilateral renal cysts are seen. Bladder is unremarkable. Stomach/Bowel: Stomach is within normal limits. Appendix appears normal. Surgically anastomosed bowel is seen within the mid sigmoid colon. Numerous surgical clips are seen scattered throughout the abdomen. Multiple dilated small bowel loops are seen throughout the mid to lower abdomen and pelvis (maximum small bowel diameter of approximately 3.9 cm). A transition zone is suspected within the pelvis on the left. Vascular/Lymphatic: Aortic atherosclerosis. Stable, enlarged para-aortic lymph nodes are seen (the largest measures 4.4 cm x 2.8 cm). A stable 2.2 cm x 3.2 cm aortocaval lymph node is also noted. Reproductive: Radiotherapy implants are seen within the prostate gland. Other: No abdominal wall hernia or abnormality. No abdominopelvic ascites or intra abdominal free  air. Musculoskeletal: Multilevel marked severity degenerative changes are seen throughout the lumbar spine. Multiple subcortical lucencies are also seen involving multiple lumbar spine vertebral bodies. IMPRESSION: 1. Small-bowel obstruction with a suspected transition zone within the pelvis on the left. 2. Stable retroperitoneal lymphadenopathy consistent with the patient's history of lymphoma. 3. Stable bilateral renal cysts. No follow-up imaging is recommended. This recommendation follows ACR consensus guidelines: Management of the Incidental Renal Mass on CT: A White Paper of the ACR Incidental Findings Committee. J Am Coll Radiol [phone]. 4. Evidence of prior cholecystectomy and prior sigmoid colon resection. 5. Multiple lytic lesions involving multiple lumbar spine vertebral bodies, consistent with osseous metastasis. 6. Aortic atherosclerosis. 7. Stable diffuse bilateral adrenal gland enlargement which may represent adrenal hyperplasia. Correlation with adrenal function tests is recommended. Aortic Atherosclerosis (ICD10-I70.0). Electronically Signed   By: Virgina Norfolk M.D.   On: 03/20/2022 21:41   CT ABDOMEN PELVIS W CONTRAST  Result Date: 03/19/2022 CLINICAL DATA:  Right lower quadrant abdominal pain. EXAM: CT ABDOMEN AND PELVIS WITH CONTRAST TECHNIQUE: Multidetector CT imaging of the abdomen and pelvis was performed using the standard protocol following bolus administration of intravenous contrast. RADIATION DOSE REDUCTION: This exam was performed according to the departmental dose-optimization program which includes automated exposure control, adjustment of the mA and/or kV according to patient size and/or use of iterative reconstruction technique. CONTRAST:  164m OMNIPAQUE IOHEXOL 300 MG/ML  SOLN COMPARISON:  CT of the abdomen and pelvis with contrast 07/15/2021. FINDINGS: Lower chest: Mild dependent atelectasis is present. Lungs are otherwise clear. Heart size is upper limits of  normal. Coronary artery calcifications are present. Hepatobiliary: Mild intra and extrahepatic biliary dilation has progressed no focal obstruction is present. There may be some mass effect from the 4 cm duodenal diverticulum. No inflammatory changes are associated. Pancreas: Unremarkable. No pancreatic ductal dilatation or surrounding inflammatory changes. Spleen: Normal in size without focal abnormality. Adrenals/Urinary Tract: Adrenal glands are within normal limits bilaterally. Bilateral simple cysts of both kidneys scratched at bilateral simple cysts are stable. No follow-up imaging is recommended. No solid lesions are present. No stone or obstruction is present. Ureters are within normal limits bilaterally. The urinary bladder is unremarkable. Stomach/Bowel: The stomach is otherwise within normal limits. The duodenal diverticulum is stable. Proximal small bowel is within normal limits. Small lymph nodes in some edema are present throughout the mesentery. Fluid levels are present within nondilated loops of bowel. Loops are present in the right lower quadrant adjacent to the peritoneum with some wall thickening and surrounding inflammatory changes. No obstruction is present. Terminal ileum is within normal limits. Appendix is not discretely visualized and may be surgically absent. The ascending and transverse colon are within normal limits. Surgical clips are noted in the left upper quadrant. Descending colon is normal. Sigmoid anastomosis is intact. Sigmoid colon is within normal limits. Vascular/Lymphatic: Atherosclerotic calcifications are present in the aorta and branch vessels without aneurysm. Enlarged retroperitoneal lymph nodes are stable. Previously measured left periaortic nodal cluster is stable measuring 4.6 x 3.6 cm on image 34 of series 2. a right periaortic node on image 35 of series 2 is also stable. No new or enlarging nodes are present. Reproductive: Brachy therapy CT noted in the prostate  gland. Seminal vesicles are within normal limits. Other: No significant ventral hernias are present. No free fluid or free air is present. Previous anterior abdominal scars are noted. Musculoskeletal: Lucent lesion at L2 is stable and chronic. Subcortical lucencies at L3 and L4 likely degenerative. These are also chronic. Remote superior endplate fracture of L1 is noted. No new lesions are present. Degenerative changes are noted at the ischial tuberosities bilaterally. Degenerative changes of the hips are stable IMPRESSION: 1. Fluid levels within nondilated loops of bowel with some wall thickening and surrounding inflammatory changes in the right lower quadrant adjacent to the peritoneum. This is concerning for a nonspecific enteritis. Adhesions may contribute. No obstruction is present. 2. Enlarged retroperitoneal lymph nodes are stable, consistent with the history of lymphoma. No evidence for disease progression. 3. Stable 4 cm duodenal diverticulum. 4. Coronary artery disease. 5. Mild intra and extrahepatic biliary dilation has progressed no focal obstruction is present. There may be some mass effect from the 4 cm duodenal diverticulum. No inflammatory changes are associated. 6.  Aortic Atherosclerosis (ICD10-I70.0). Electronically Signed   By: CSan MorelleM.D.   On: 03/19/2022 11:14   DG Abdomen 1 View  Result Date: 03/19/2022 CLINICAL DATA:  Concern for bowel  obstruction. Lower abdominal pain. EXAM: ABDOMEN - 1 VIEW COMPARISON:  CT abdomen and pelvis 07/15/2021 FINDINGS: Scattered bowel gas throughout the abdomen and pelvis. There is gas within the small and large bowel. Nonobstructive bowel gas pattern. Bowel postsurgical changes in the pelvis and brachytherapy seeds in the prostate. Mild levoscoliosis in lumbar spine with multilevel degenerative changes in lumbar spine. Surgical clips in the upper abdomen. Chronic calcification in the medial right abdomen. IMPRESSION: 1. Nonobstructive bowel gas  pattern. 2. Postsurgical changes in the abdomen and pelvis. Electronically Signed   By: Markus Daft M.D.   On: 03/19/2022 09:50    Procedures Procedures    Medications Ordered in ED Medications  enoxaparin (LOVENOX) injection 40 mg (has no administration in time range)  dextrose 5 %-0.9 % sodium chloride infusion (has no administration in time range)  morphine (PF) 2 MG/ML injection 2 mg (has no administration in time range)  ondansetron (ZOFRAN) tablet 4 mg (has no administration in time range)    Or  ondansetron (ZOFRAN) injection 4 mg (has no administration in time range)  morphine (PF) 4 MG/ML injection 4 mg (4 mg Intravenous Given 03/20/22 2206)    ED Course/ Medical Decision Making/ A&P                           Medical Decision Making Elderly male with multiple medical problems, and history mostly impressive for multiple abdominal surgeries presents with abdominal pain, anorexia, inability to eat secondary to pain, nausea.  With evaluation from yesterday reviewed, CT scan from yesterday reviewed, concern for enteritis versus bowel obstruction versus bacteremia versus sepsis versus perforation, patient on morphine, labs, CT scan  Amount and/or Complexity of Data Reviewed Independent Historian: spouse External Data Reviewed: notes.    Details: Eval from yesterday, CT scan from yesterday both reviewed Labs:  Decision-making details documented in ED Course. Radiology: ordered. Decision-making details documented in ED Course.  Risk Prescription drug management. Decision regarding hospitalization.   I discussed the patient's CT findings with our surgeon Dr. Cleatrice Burke, and internal medicine's colleagues for admission. In essence this elderly male with history of prior bowel obstructions, abdominal surgeries presents with signs and symptoms concern for bowel obstruction, CT consistent with this as well.  No evidence for perforation, patient is not actively vomiting, and after we  discussed NG tube, this will be considered pending symptoms, as he had substantial relief with morphine here. Patient admitted for further monitoring, management.        Final Clinical Impression(s) / ED Diagnoses Final diagnoses:  SBO (small bowel obstruction) ([place])    Rx / DC Orders ED Discharge Orders     None         Carmin Muskrat, MD 03/20/22 2353

## 2022-03-20 NOTE — H&P (Incomplete)
History and Physical    Patient: Jose Ware DOB: 1934-03-27 DOA: 03/20/2022 DOS: the patient was seen and examined on 03/20/2022 PCP: [name], MD  Patient coming from: Home  Chief Complaint:  Chief Complaint  Patient presents with  . Abdominal Pain   HPI: Jose Ware is a 88 y.o. male with medical history significant of adenocarcinoma of the prostate, history of lymphoma, coronary artery disease, history of CVA, history of diverticulitis, history of kidney stones, ischemic cardiomyopathy, history of multiple abdominal surgeries including colostomy and reversal, presenting to the ER with complaint of anorexia, nausea and abdominal pain with distention.  Patient has been unable to have bowel movement since yesterday.  He was in the ER on Sunday for similar complaint of abdominal pain.  He did have colonoscopy performed on January 4.  He was doing fine initially but has continued to have this pain.  Patient came to the ER now with imaging showing small bowel obstruction.  Surgery has been consulted on patient is being admitted to the medical service for further evaluation and treatment.  He denied any fever.  No sick contact.  Review of Systems: As mentioned in the history of present illness. All other systems reviewed and are negative. Past Medical History:  Diagnosis Date  . Adenocarcinoma of prostate [facility])    XRT & Radiation seed implantation in 2010  . Adenomatous colon polyp   . CAD (coronary artery disease)    a. 11/04/15:  Acute inferolateral STEMI: S/p emergent DES of a very large LCx with extensive thrombus. Significant residual disease in the proximal LAD and distal RCA. S/p staged PCI of LAD and RCA 8/29.  . Cataract   . CVA (cerebral infarction)    a. 05/7900: embolic CVA after heart cath   . CVA (cerebral infarction)    a. 06/971: embolic CVA after heart cath   . DIVERTICULITIS, HX OF 11/27/2006       . DJD (degenerative joint disease)     wrist - R   . Hernia   . History of blood transfusion 1985   with colon surgery  . History of kidney stones   . Hypertension   . Ischemic cardiomyopathy    a. EF 25-30% by LV gram on cath 11/04/15. Appeared out of proportion to infarct although infarct was large. EF improved by Echo and now 40-45%.  . Myocardial infarction ([place]) 2019  . NEPHROLITHIASIS, HX OF 11/27/2006        . Non Hodgkin's lymphoma ([place]) 11/2020   watching at this point, in abdomen, no meds or chemo yet as of 01-27-2022  . Peripheral neuropathy    pt denies  . Pneumonia    as a child  . Psoriasis   . Sleep apnea    wears cpap  . Stroke [facility])    no residuals  . Tenosynovitis    Past Surgical History:  Procedure Laterality Date  . APPENDECTOMY  03/11/1983  . BACK SURGERY  03/10/1978   minor disk surgery: instrumentation placed and removed in a second procedure  . CARDIAC CATHETERIZATION N/A 11/04/2015   Procedure: Left Heart Cath and Coronary Angiography;  Surgeon: Peter M Martinique, MD;  Location: [facility];  Service: Cardiovascular;  Laterality: N/A;  . CARDIAC CATHETERIZATION N/A 11/04/2015   Procedure: Coronary Stent Intervention;  Surgeon: Peter M Martinique, MD;  Location: [facility];  Service: Cardiovascular;  Laterality: N/A;  . CARDIAC CATHETERIZATION N/A 11/06/2015   Procedure: Coronary  Stent Intervention;  Surgeon: Leonie Man, MD;  Location: [facility];  Service: Cardiovascular;  Laterality: N/A;  . CATARACT EXTRACTION, BILATERAL  03/11/2011  . CHOLECYSTECTOMY  03/10/1984  . COLON SURGERY  11/09/1983   pt. had ileus, had colon surgery, requiring colostomy & then reversal & then dehisence of that wound & return to OR for repair & cholecystectomy    . COLONOSCOPY  Jun 05, 2017  . COLONOSCOPY WITH PROPOFOL N/A 03/13/2022   Procedure: COLONOSCOPY WITH PROPOFOL;  Surgeon: Ladene Artist, MD;  Location: [facility];  Service: Gastroenterology;  Laterality: N/A;  . COLOSTOMY  03/11/1983    After colectomy for diverticulitis, pt. remarks during this surgery they "gave me the paddles two times  because of bleeding", pt. states it was unrelated to any anesthesia complication  . EYE SURGERY     /w IOL  . KNEE ARTHROSCOPY Left 03/10/2001  . KNEE SURGERY Left 03/10/1954   Left 1956, Right w/cartilage removed later  . PARATHYROIDECTOMY N/A 05/12/2014   Procedure: PARATHYROIDECTOMY;  Surgeon: Armandina Gemma, MD;  Location: [facility];  Service: General;  Laterality: N/A;  . POLYPECTOMY    . POLYPECTOMY  03/13/2022   Procedure: POLYPECTOMY;  Surgeon: Ladene Artist, MD;  Location: [facility];  Service: Gastroenterology;;  . REVERSAL OF COLOSTOMY  03/10/1985  . TONSILLECTOMY  03/10/1944  . WRIST SURGERY Left    Remote complicated w/infection   Social History:  reports that he quit smoking about 39 years ago. His smoking use included cigarettes. He has a 60.00 pack-year smoking history. He has never used smokeless tobacco. He reports that he does not currently use alcohol. He reports that he does not use drugs.  Allergies  Allergen Reactions  . Bee Venom Swelling    Facial swelling  . Betadine [Povidone Iodine] Other (See Comments)    blistering  . Doxycycline     Possible drug rash- back of right lower leg and onto left lower leg as well    Family History  Problem Relation Age of Onset  . Coronary artery disease Mother   . Alcohol abuse Father   . Cirrhosis Father   . Heart failure Sister   . Breast cancer Sister        W/involvement of right arm leading to amputation  . Anemia Sister        related to treatment to lymphoma  . Non-Hodgkin's lymphoma Sister   . Other Sister        died from covid June 05, 2020  . Coronary artery disease Brother   . Heart attack Brother   . Non-Hodgkin's lymphoma Brother   . Clotting disorder Brother   . Non-Hodgkin's lymphoma Brother   . Prostate cancer Neg Hx   . Colon cancer Neg Hx   . Diabetes Neg Hx   . Glaucoma Neg Hx   . Rectal cancer  Neg Hx   . Esophageal cancer Neg Hx   . Colon polyps Neg Hx   . Stomach cancer Neg Hx     Prior to Admission medications   Medication Sig Start Date End Date Taking? Authorizing Provider  amLODipine (NORVASC) 5 MG tablet Take 1 tablet by mouth once daily 03/11/22   [name], MD  aspirin EC 81 MG tablet Take 1 tablet (81 mg total) by mouth daily. 11/16/15   Martinique, Peter M, MD  atorvastatin (LIPITOR) 10 MG tablet Take 1 tablet (10 mg total) by mouth every other day. 01/10/22   [name],  MD  carvedilol (COREG) 3.125 MG tablet Take 1 tablet by mouth twice daily 02/21/22   Martinique, Peter M, MD  Cyanocobalamin (VITAMIN B-12) 5000 MCG TBDP Take 5,000 mcg by mouth every morning.    [provider]  dicyclomine (BENTYL) 20 MG tablet Take 1 tablet (20 mg total) by mouth 2 (two) times daily as needed for spasms. 03/19/22   Kommor, Debe Coder, MD  losartan (COZAAR) 100 MG tablet Take 1 tablet by mouth once daily 07/22/21   Martinique, Peter M, MD  Multiple Vitamins-Minerals (OCUVITE ADULT 50+ PO) Take 1 tablet by mouth daily. 03/11/19   [provider]  nitroGLYCERIN (NITROSTAT) 0.4 MG SL tablet Place 1 tablet (0.4 mg total) under the tongue every 5 (five) minutes x 3 doses as needed for chest pain. 12/27/20   [name], MD  PREVIDENT 5000 DRY MOUTH 1.1 % GEL dental gel Place 1 Application onto teeth at bedtime. 11/20/21   [provider]  spironolactone (ALDACTONE) 25 MG tablet Take 1 tablet by mouth once daily 12/30/21   Martinique, Peter M, MD    Physical Exam: Vitals:   03/20/22 2145 03/20/22 2200 03/20/22 2328 03/20/22 2332  BP: (!) 150/70 (!) 159/78 126/73   Pulse: 60 62 62   Resp: (!) 25 (!) 24 17   Temp:   97.7 F (36.5 C)   TempSrc:   Oral   SpO2: 97% 98% 95%   Weight:    90.5 kg   Constitutional: Acutely ill looking, NAD, calm, comfortable Eyes: PERRL, lids and conjunctivae normal ENMT: Mucous membranes are moist. Posterior pharynx clear of any exudate  or lesions.Normal dentition.  Neck: normal, supple, no masses, no thyromegaly Respiratory: clear to auscultation bilaterally, no wheezing, no crackles. Normal respiratory effort. No accessory muscle use.  Cardiovascular: Regular rate and rhythm, no murmurs / rubs / gallops. No extremity edema. 2+ pedal pulses. No carotid bruits.  Abdomen: Distended, slightly tender, no masses palpated. No hepatosplenomegaly. Bowel sounds positive.  Musculoskeletal: Good range of motion, no joint swelling or tenderness, Skin: no rashes, lesions, ulcers. No induration Neurologic: CN 2-12 grossly intact. Sensation intact, DTR normal. Strength 5/5 in all 4.  Psychiatric: Normal judgment and insight. Alert and oriented x 3. Normal mood  Data Reviewed:  Blood pressure 150/70, pulse 60, temperature 97.7.  Sodium 132,  Assessment and Plan: No notes have been filed under this hospital service. Service: Hospitalist     Advance Care Planning:   Code Status: Full Code ***  Consults: ***  Family Communication: ***  Severity of Illness: {Observation/Inpatient:21159}  AuthorBarbette Merino, MD 03/20/2022 11:46 PM  For on call review [email or web address].

## 2022-03-20 NOTE — ED Notes (Signed)
Pt. Made aware for the need of urine specimen. 

## 2022-03-20 NOTE — ED Provider Triage Note (Signed)
Emergency Medicine Provider Triage Evaluation Note  Jose Ware , a 88 y.o. male  was evaluated in triage.  Patient presenting today with recurrent abdominal pain.  Was seen yesterday with an extensive workup and sent home with Bentyl.  Says that the pain is worse.  Review of Systems  Positive:  Negative:   Physical Exam  BP (!) 148/74   Pulse 63   Temp 97.8 F (36.6 C) (Oral)   Resp 18   SpO2 96%  Gen:   Awake, no distress   Resp:  Normal effort  MSK:   Moves extremities without difficulty  Other:    Medical Decision Making  Medically screening exam initiated at 8:48 PM.  Appropriate orders placed.  Jose Ware was informed that the remainder of the evaluation will be completed by another provider, this initial triage assessment does not replace that evaluation, and the importance of remaining in the ED until their evaluation is complete.  Will repeat labs, no repeat his scans from triage   Jose Hammock, PA-C 03/20/22 2048

## 2022-03-20 NOTE — ED Triage Notes (Signed)
Pt continues to have lower right abdominal pain since Sunday. Pt states that the pain comes and goes.

## 2022-03-20 NOTE — H&P (Signed)
History and Physical    Patient: Jose Ware:[id number] DOB: 01-Dec-1934 DOA: 03/20/2022 DOS: the patient was seen and examined on 03/20/2022 PCP: [name], MD  Patient coming from: Home  Chief Complaint:  Chief Complaint  Patient presents with   Abdominal Pain   HPI: Jose Ware is a 88 y.o. male with medical history significant of adenocarcinoma of the prostate, history of lymphoma, coronary artery disease, history of CVA, history of diverticulitis, history of kidney stones, ischemic cardiomyopathy, history of multiple abdominal surgeries including colostomy and reversal, presenting to the ER with complaint of anorexia, nausea and abdominal pain with distention.  Patient has been unable to have bowel movement since yesterday.  He was in the ER on Sunday for similar complaint of abdominal pain.  He did have colonoscopy performed on January 4.  He was doing fine initially but has continued to have this pain.  Patient came to the ER now with imaging showing small bowel obstruction.  Surgery has been consulted on patient is being admitted to the medical service for further evaluation and treatment.  He denied any fever.  No sick contact.  Review of Systems: As mentioned in the history of present illness. All other systems reviewed and are negative. Past Medical History:  Diagnosis Date   Adenocarcinoma of prostate [facility])    XRT & Radiation seed implantation in 2010   Adenomatous colon polyp    CAD (coronary artery disease)    a. 11/04/15:  Acute inferolateral STEMI: S/p emergent DES of a very large LCx with extensive thrombus. Significant residual disease in the proximal LAD and distal RCA. S/p staged PCI of LAD and RCA 8/29.   Cataract    CVA (cerebral infarction)    a. 05/5454: embolic CVA after heart cath    CVA (cerebral infarction)    a. 04/5636: embolic CVA after heart cath    DIVERTICULITIS, HX OF 11/27/2006        DJD (degenerative joint disease)    wrist - R     Hernia    History of blood transfusion 1985   with colon surgery   History of kidney stones    Hypertension    Ischemic cardiomyopathy    a. EF 25-30% by LV gram on cath 11/04/15. Appeared out of proportion to infarct although infarct was large. EF improved by Echo and now 40-45%.   Myocardial infarction ([place]) 2019   NEPHROLITHIASIS, HX OF 11/27/2006         Non Hodgkin's lymphoma ([place]) 11/2020   watching at this point, in abdomen, no meds or chemo yet as of 01-27-2022   Peripheral neuropathy    pt denies   Pneumonia    as a child   Psoriasis    Sleep apnea    wears cpap   Stroke [facility])    no residuals   Tenosynovitis    Past Surgical History:  Procedure Laterality Date   APPENDECTOMY  03/11/1983   BACK SURGERY  03/10/1978   minor disk surgery: instrumentation placed and removed in a second procedure   CARDIAC CATHETERIZATION N/A 11/04/2015   Procedure: Left Heart Cath and Coronary Angiography;  Surgeon: Peter M Martinique, MD;  Location: [facility];  Service: Cardiovascular;  Laterality: N/A;   CARDIAC CATHETERIZATION N/A 11/04/2015   Procedure: Coronary Stent Intervention;  Surgeon: Peter M Martinique, MD;  Location: [place] [facility];  Service: Cardiovascular;  Laterality: N/A;   CARDIAC CATHETERIZATION N/A 11/06/2015   Procedure: Coronary  Stent Intervention;  Surgeon: Leonie Man, MD;  Location: [place] [facility];  Service: Cardiovascular;  Laterality: N/A;   CATARACT EXTRACTION, BILATERAL  03/11/2011   CHOLECYSTECTOMY  03/10/1984   COLON SURGERY  11/09/1983   pt. had ileus, had colon surgery, requiring colostomy & then reversal & then dehisence of that wound & return to OR for repair & cholecystectomy     COLONOSCOPY  06/16/2017   COLONOSCOPY WITH PROPOFOL N/A 03/13/2022   Procedure: COLONOSCOPY WITH PROPOFOL;  Surgeon: Ladene Artist, MD;  Location: [facility];  Service: Gastroenterology;  Laterality: N/A;   COLOSTOMY  03/11/1983   After colectomy for  diverticulitis, pt. remarks during this surgery they "gave me the paddles two times  because of bleeding", pt. states it was unrelated to any anesthesia complication   EYE SURGERY     /w IOL   KNEE ARTHROSCOPY Left 03/10/2001   KNEE SURGERY Left 03/10/1954   Left 1954/06/17, Right w/cartilage removed later   PARATHYROIDECTOMY N/A 05/12/2014   Procedure: PARATHYROIDECTOMY;  Surgeon: Armandina Gemma, MD;  Location: [facility];  Service: General;  Laterality: N/A;   POLYPECTOMY     POLYPECTOMY  03/13/2022   Procedure: POLYPECTOMY;  Surgeon: Ladene Artist, MD;  Location: [facility];  Service: Gastroenterology;;   REVERSAL OF COLOSTOMY  03/10/1985   TONSILLECTOMY  03/10/1944   WRIST SURGERY Left    Remote complicated w/infection   Social History:  reports that he quit smoking about 39 years ago. His smoking use included cigarettes. He has a 60.00 pack-year smoking history. He has never used smokeless tobacco. He reports that he does not currently use alcohol. He reports that he does not use drugs.  Allergies  Allergen Reactions   Bee Venom Swelling    Facial swelling   Betadine [Povidone Iodine] Other (See Comments)    blistering   Doxycycline     Possible drug rash- back of right lower leg and onto left lower leg as well    Family History  Problem Relation Age of Onset   Coronary artery disease Mother    Alcohol abuse Father    Cirrhosis Father    Heart failure Sister    Breast cancer Sister        W/involvement of right arm leading to amputation   Anemia Sister        related to treatment to lymphoma   Non-Hodgkin's lymphoma Sister    Other Sister        died from covid 06/16/20   Coronary artery disease Brother    Heart attack Brother    Non-Hodgkin's lymphoma Brother    Clotting disorder Brother    Non-Hodgkin's lymphoma Brother    Prostate cancer Neg Hx    Colon cancer Neg Hx    Diabetes Neg Hx    Glaucoma Neg Hx    Rectal cancer Neg Hx    Esophageal cancer Neg Hx    Colon polyps  Neg Hx    Stomach cancer Neg Hx     Prior to Admission medications   Medication Sig Start Date End Date Taking? Authorizing Provider  amLODipine (NORVASC) 5 MG tablet Take 1 tablet by mouth once daily 03/11/22   [name], MD  aspirin EC 81 MG tablet Take 1 tablet (81 mg total) by mouth daily. 11/16/15   Martinique, Peter M, MD  atorvastatin (LIPITOR) 10 MG tablet Take 1 tablet (10 mg total) by mouth every other day. 01/10/22   [name],  MD  carvedilol (COREG) 3.125 MG tablet Take 1 tablet by mouth twice daily 02/21/22   Martinique, Peter M, MD  Cyanocobalamin (VITAMIN B-12) 5000 MCG TBDP Take 5,000 mcg by mouth every morning.    [provider]  dicyclomine (BENTYL) 20 MG tablet Take 1 tablet (20 mg total) by mouth 2 (two) times daily as needed for spasms. 03/19/22   Kommor, Debe Coder, MD  losartan (COZAAR) 100 MG tablet Take 1 tablet by mouth once daily 07/22/21   Martinique, Peter M, MD  Multiple Vitamins-Minerals (OCUVITE ADULT 50+ PO) Take 1 tablet by mouth daily. 03/11/19   [provider]  nitroGLYCERIN (NITROSTAT) 0.4 MG SL tablet Place 1 tablet (0.4 mg total) under the tongue every 5 (five) minutes x 3 doses as needed for chest pain. 12/27/20   [name], MD  PREVIDENT 5000 DRY MOUTH 1.1 % GEL dental gel Place 1 Application onto teeth at bedtime. 11/20/21   [provider]  spironolactone (ALDACTONE) 25 MG tablet Take 1 tablet by mouth once daily 12/30/21   Martinique, Peter M, MD    Physical Exam: Vitals:   03/20/22 2145 03/20/22 2200 03/20/22 2328 03/20/22 2332  BP: (!) 150/70 (!) 159/78 126/73   Pulse: 60 62 62   Resp: (!) 25 (!) 24 17   Temp:   97.7 F (36.5 C)   TempSrc:   Oral   SpO2: 97% 98% 95%   Weight:    90.5 kg   Constitutional: Acutely ill looking, NAD, calm, comfortable Eyes: PERRL, lids and conjunctivae normal ENMT: Mucous membranes are moist. Posterior pharynx clear of any exudate or lesions.Normal dentition.  Neck: normal, supple, no  masses, no thyromegaly Respiratory: clear to auscultation bilaterally, no wheezing, no crackles. Normal respiratory effort. No accessory muscle use.  Cardiovascular: Regular rate and rhythm, no murmurs / rubs / gallops. No extremity edema. 2+ pedal pulses. No carotid bruits.  Abdomen: Distended, slightly tender, no masses palpated. No hepatosplenomegaly. Bowel sounds positive.  Musculoskeletal: Good range of motion, no joint swelling or tenderness, Skin: no rashes, lesions, ulcers. No induration Neurologic: CN 2-12 grossly intact. Sensation intact, DTR normal. Strength 5/5 in all 4.  Psychiatric: Normal judgment and insight. Alert and oriented x 3. Normal mood  Data Reviewed:  Blood pressure 150/70, pulse 60, temperature 97.7.  Sodium 132, glucose 117 BUN 30 and creatinine 1.21.  Total bilirubin 1.8.  White count is 11.4.  Glucose 117.  Acute viral screen is negative.  Urinalysis essentially negative.  CT abdomen and pelvis showed small bowel obstruction with transition zone within the pelvis and the left.  There is stable retroperitoneal lymphadenopathy consistent with patient's history of non-Hodgkin's lymphoma.  Assessment and Plan:  #1 small bowel obstruction: Patient has had some relief with fluids and pain control in the ER.  No active vomiting.  Surgery has been consulted.  NG tube at this point deferred but could be started if vomiting anxious.  He has had multiple abdominal surgeries and also has significant lymphadenopathy from non-Hodgkin's lymphoma.  This could be adhesion related therefore.  Continue supportive care and defer to surgery.  #2 non-Hodgkin's lymphoma: Not on chemotherapy at the moment.  Defer to outpatient management.  #3 hyponatremia: Hydrate patient and monitor  #4 history of prostate cancer with osseous metastasis: Defer to outpatient  #5 essential hypertension: Confirm on resume home regimen.  #6 history of coronary artery disease: Status post previous PCI.   Continue to monitor  #7 generalized weakness: Multifactorial.  Continue  to monitor.  Will get PT and OT consult before discharge     Advance Care Planning:   Code Status: Full Code   Consults: Dr. Marlou Starks, general surgery  Family Communication: Wife at bedside  Severity of Illness: The appropriate patient status for this patient is INPATIENT. Inpatient status is judged to be reasonable and necessary in order to provide the required intensity of service to ensure the patient's safety. The patient's presenting symptoms, physical exam findings, and initial radiographic and laboratory data in the context of their chronic comorbidities is felt to place them at high risk for further clinical deterioration. Furthermore, it is not anticipated that the patient will be medically stable for discharge from the hospital within 2 midnights of admission.   * I certify that at the point of admission it is my clinical judgment that the patient will require inpatient hospital care spanning beyond 2 midnights from the point of admission due to high intensity of service, high risk for further deterioration and high frequency of surveillance required.*  AuthorBarbette Merino, MD 03/20/2022 11:46 PM  For on call review [email or web address].

## 2022-03-20 NOTE — ED Notes (Signed)
Pt taken to CT.

## 2022-03-21 ENCOUNTER — Inpatient Hospital Stay (HOSPITAL_COMMUNITY): Payer: Medicare Other

## 2022-03-21 DIAGNOSIS — E876 Hypokalemia: Secondary | ICD-10-CM | POA: Diagnosis not present

## 2022-03-21 DIAGNOSIS — E871 Hypo-osmolality and hyponatremia: Secondary | ICD-10-CM | POA: Diagnosis not present

## 2022-03-21 DIAGNOSIS — K56609 Unspecified intestinal obstruction, unspecified as to partial versus complete obstruction: Secondary | ICD-10-CM | POA: Diagnosis not present

## 2022-03-21 DIAGNOSIS — I5032 Chronic diastolic (congestive) heart failure: Secondary | ICD-10-CM | POA: Diagnosis present

## 2022-03-21 DIAGNOSIS — I10 Essential (primary) hypertension: Secondary | ICD-10-CM

## 2022-03-21 DIAGNOSIS — E782 Mixed hyperlipidemia: Secondary | ICD-10-CM | POA: Diagnosis present

## 2022-03-21 DIAGNOSIS — G4733 Obstructive sleep apnea (adult) (pediatric): Secondary | ICD-10-CM

## 2022-03-21 DIAGNOSIS — C8293 Follicular lymphoma, unspecified, intra-abdominal lymph nodes: Secondary | ICD-10-CM

## 2022-03-21 DIAGNOSIS — I2510 Atherosclerotic heart disease of native coronary artery without angina pectoris: Secondary | ICD-10-CM

## 2022-03-21 LAB — COMPREHENSIVE METABOLIC PANEL
ALT: 12 U/L (ref 0–44)
AST: 18 U/L (ref 15–41)
Albumin: 3 g/dL — ABNORMAL LOW (ref 3.5–5.0)
Alkaline Phosphatase: 59 U/L (ref 38–126)
Anion gap: 5 (ref 5–15)
BUN: 29 mg/dL — ABNORMAL HIGH (ref 8–23)
CO2: 25 mmol/L (ref 22–32)
Calcium: 8.3 mg/dL — ABNORMAL LOW (ref 8.9–10.3)
Chloride: 104 mmol/L (ref 98–111)
Creatinine, Ser: 1.04 mg/dL (ref 0.61–1.24)
GFR, Estimated: >60 mL/min (ref >60)
Glucose, Bld: 112 mg/dL — ABNORMAL HIGH (ref 70–99)
Potassium: 3.4 mmol/L — ABNORMAL LOW (ref 3.5–5.1)
Sodium: 134 mmol/L — ABNORMAL LOW (ref 135–145)
Total Bilirubin: 1.8 mg/dL — ABNORMAL HIGH (ref 0.3–1.2)
Total Protein: 5.8 g/dL — ABNORMAL LOW (ref 6.5–8.1)

## 2022-03-21 LAB — CBC
HCT: 37.1 % — ABNORMAL LOW (ref 39.0–52.0)
Hemoglobin: 12.2 g/dL — ABNORMAL LOW (ref 13.0–17.0)
MCH: 32.4 pg (ref 26.0–34.0)
MCHC: 32.9 g/dL (ref 30.0–36.0)
MCV: 98.7 fL (ref 80.0–100.0)
Platelets: 227 10*3/uL (ref 150–400)
RBC: 3.76 MIL/uL — ABNORMAL LOW (ref 4.22–5.81)
RDW: 14.2 % (ref 11.5–15.5)
WBC: 9.5 10*3/uL (ref 4.0–10.5)
nRBC: 0 % (ref 0.0–0.2)

## 2022-03-21 LAB — URINALYSIS, ROUTINE W REFLEX MICROSCOPIC
Bacteria, UA: NONE SEEN
Bilirubin Urine: NEGATIVE
Glucose, UA: NEGATIVE mg/dL
Ketones, ur: NEGATIVE mg/dL
Leukocytes,Ua: NEGATIVE
Nitrite: NEGATIVE
Protein, ur: NEGATIVE mg/dL
Specific Gravity, Urine: 1.014 (ref 1.005–1.030)
pH: 5 (ref 5.0–8.0)

## 2022-03-21 LAB — MAGNESIUM: Magnesium: 1.5 mg/dL — ABNORMAL LOW (ref 1.7–2.4)

## 2022-03-21 MED ORDER — DIATRIZOATE MEGLUMINE & SODIUM 66-10 % PO SOLN
90.0000 mL | Freq: Once | ORAL | Status: AC
  Administered 2022-03-21: 90 mL via NASOGASTRIC
  Filled 2022-03-21: qty 90

## 2022-03-21 MED ORDER — POTASSIUM CHLORIDE 10 MEQ/100ML IV SOLN
10.0000 meq | INTRAVENOUS | Status: AC
  Administered 2022-03-21 (×2): 10 meq via INTRAVENOUS
  Filled 2022-03-21 (×4): qty 100

## 2022-03-21 MED ORDER — PHENOL 1.4 % MT LIQD
1.0000 | OROMUCOSAL | Status: DC | PRN
  Administered 2022-03-22: 1 via OROMUCOSAL
  Filled 2022-03-21: qty 177

## 2022-03-21 MED ORDER — MAGNESIUM SULFATE 50 % IJ SOLN
3.0000 g | Freq: Once | INTRAVENOUS | Status: AC
  Administered 2022-03-21: 3 g via INTRAVENOUS
  Filled 2022-03-21: qty 6

## 2022-03-21 MED ORDER — MORPHINE SULFATE (PF) 2 MG/ML IV SOLN
1.0000 mg | INTRAVENOUS | Status: DC | PRN
  Administered 2022-03-25: 1 mg via INTRAVENOUS
  Filled 2022-03-21: qty 1

## 2022-03-21 MED ORDER — POTASSIUM CHLORIDE 2 MEQ/ML IV SOLN
INTRAVENOUS | Status: AC
  Filled 2022-03-21 (×3): qty 1000

## 2022-03-21 MED ORDER — POTASSIUM CHLORIDE 2 MEQ/ML IV SOLN
INTRAVENOUS | Status: DC
  Filled 2022-03-21 (×21): qty 1000

## 2022-03-21 MED ORDER — MORPHINE SULFATE (PF) 2 MG/ML IV SOLN
2.0000 mg | INTRAVENOUS | Status: DC | PRN
  Administered 2022-03-22 – 2022-03-26 (×5): 2 mg via INTRAVENOUS
  Filled 2022-03-21 (×6): qty 1

## 2022-03-21 MED ORDER — HYDRALAZINE HCL 20 MG/ML IJ SOLN: 10.0000 mg | Freq: Four times a day (QID) | INTRAMUSCULAR | Status: DC | PRN

## 2022-03-21 NOTE — Assessment & Plan Note (Signed)
No clinical evidence of cardiogenic volume overload Strict input and output monitoring Daily weights

## 2022-03-21 NOTE — Consult Note (Signed)
[facility] Surgery Consult Note  Jose Ware 11-Jan-1935  [id number].    Requesting MD: Inda Merlin Chief Complaint/Reason for Consult: SBO  HPI:  Jose Ware is a 88 y.o. male PMH follicular lymphoma (follows with Dr. 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 States that he had a colonoscopy about 1 week ago, then his symptoms started about 5 days ago. He reports progressive abdominal pain, mostly in the RLQ. He has had nausea but no vomiting. Last bowel movement was 1/10 and he has passed no gas since then. He reports a prior h/o of 2-3 SBO's, most recently 11/2020 requiring admission and this resolved with conservative management. CT scan shows Small-bowel obstruction with a suspected transition zone within the pelvis on the left.  Patient was admitted to the medical service. General surgery asked to see.  Patient had multiple prior abdominal surgeries in the 1980's starting with diverticulitis requiring colectomy/ colostomy, subsequent reversal, appendectomy, open cholecystectomy.   Anticoagulants: none Lives at home with his wife Ambulates with walking stick Very independent, still drives   Family History  Problem Relation Age of Onset   Coronary artery disease Mother    Alcohol abuse Father    Cirrhosis Father    Heart failure Sister    Breast cancer Sister        W/involvement of right arm leading to amputation   Anemia Sister        related to treatment to lymphoma   Non-Hodgkin's lymphoma Sister    Other Sister        died from covid Jun 03, 2020   Coronary artery disease Brother    Heart attack Brother    Non-Hodgkin's lymphoma Brother    Clotting disorder Brother    Non-Hodgkin's lymphoma Brother    Prostate cancer Neg Hx     Colon cancer Neg Hx    Diabetes Neg Hx    Glaucoma Neg Hx    Rectal cancer Neg Hx    Esophageal cancer Neg Hx    Colon polyps Neg Hx    Stomach cancer Neg Hx     Past Medical History:  Diagnosis Date   Adenocarcinoma of prostate ([place])    XRT & Radiation seed implantation in 06-03-2008   Adenomatous colon polyp    CAD (coronary artery disease)    a. 11/04/15:  Acute inferolateral STEMI: S/p emergent DES of a very large LCx with extensive thrombus. Significant residual disease in the proximal LAD and distal RCA. S/p staged PCI of LAD and RCA 8/29.   Cataract    CVA (cerebral infarction)    a. 10/1446: embolic CVA after heart cath    CVA (cerebral infarction)    a. 03/8561: embolic CVA after heart cath    DIVERTICULITIS, HX OF 11/27/2006        DJD (degenerative joint disease)    wrist - R    Hernia    History of blood transfusion 1985   with colon surgery   History of kidney stones    Hypertension    Ischemic cardiomyopathy    a. EF 25-30% by LV gram on cath 11/04/15. Appeared out of proportion to infarct although infarct was large. EF improved by Echo and now 40-45%.   Myocardial infarction ([place]) 06-03-2017   NEPHROLITHIASIS,  HX OF 11/27/2006         Non Hodgkin's lymphoma ([place]) 11/2020   watching at this point, in abdomen, no meds or chemo yet as of 01-27-2022   Peripheral neuropathy    pt denies   Pneumonia    as a child   Psoriasis    Sleep apnea    wears cpap   Stroke [facility])    no residuals   Tenosynovitis     Past Surgical History:  Procedure Laterality Date   APPENDECTOMY  03/11/1983   BACK SURGERY  03/10/1978   minor disk surgery: instrumentation placed and removed in a second procedure   CARDIAC CATHETERIZATION N/A 11/04/2015   Procedure: Left Heart Cath and Coronary Angiography;  Surgeon: Peter M Martinique, MD;  Location: [place] [facility];  Service: Cardiovascular;  Laterality: N/A;   CARDIAC CATHETERIZATION N/A 11/04/2015   Procedure: Coronary Stent Intervention;   Surgeon: Peter M Martinique, MD;  Location: [place] [facility];  Service: Cardiovascular;  Laterality: N/A;   CARDIAC CATHETERIZATION N/A 11/06/2015   Procedure: Coronary Stent Intervention;  Surgeon: Leonie Man, MD;  Location: [place] [facility];  Service: Cardiovascular;  Laterality: N/A;   CATARACT EXTRACTION, BILATERAL  03/11/2011   CHOLECYSTECTOMY  03/10/1984   COLON SURGERY  11/09/1983   pt. had ileus, had colon surgery, requiring colostomy & then reversal & then dehisence of that wound & return to OR for repair & cholecystectomy     COLONOSCOPY  2019   COLONOSCOPY WITH PROPOFOL N/A 03/13/2022   Procedure: COLONOSCOPY WITH PROPOFOL;  Surgeon: Ladene Artist, MD;  Location: [facility];  Service: Gastroenterology;  Laterality: N/A;   COLOSTOMY  03/11/1983   After colectomy for diverticulitis, pt. remarks during this surgery they "gave me the paddles two times  because of bleeding", pt. states it was unrelated to any anesthesia complication   EYE SURGERY     /w IOL   KNEE ARTHROSCOPY Left 03/10/2001   KNEE SURGERY Left 03/10/1954   Left 1956, Right w/cartilage removed later   PARATHYROIDECTOMY N/A 05/12/2014   Procedure: PARATHYROIDECTOMY;  Surgeon: Armandina Gemma, MD;  Location: [place];  Service: General;  Laterality: N/A;   POLYPECTOMY     POLYPECTOMY  03/13/2022   Procedure: POLYPECTOMY;  Surgeon: Ladene Artist, MD;  Location: [facility];  Service: Gastroenterology;;   REVERSAL OF COLOSTOMY  03/10/1985   TONSILLECTOMY  03/10/1944   WRIST SURGERY Left    Remote complicated w/infection    Social History:  reports that he quit smoking about 39 years ago. His smoking use included cigarettes. He has a 60.00 pack-year smoking history. He has never used smokeless tobacco. He reports that he does not currently use alcohol. He reports that he does not use drugs.  Allergies:  Allergies  Allergen Reactions   Bee Venom Swelling    Facial swelling   Betadine [Povidone Iodine] Other (See  Comments)    blistering   Doxycycline     Possible drug rash- back of right lower leg and onto left lower leg as well    Medications Prior to Admission  Medication Sig Dispense Refill   amLODipine (NORVASC) 5 MG tablet Take 1 tablet by mouth once daily 90 tablet 0   aspirin EC 81 MG tablet Take 1 tablet (81 mg total) by mouth daily. 90 tablet 1   atorvastatin (LIPITOR) 10 MG tablet Take 1 tablet (10 mg total) by mouth every other day. 43 tablet 3   carvedilol (COREG)  3.125 MG tablet Take 1 tablet by mouth twice daily 180 tablet 3   Cyanocobalamin (VITAMIN B-12) 5000 MCG TBDP Take 5,000 mcg by mouth every morning.     dicyclomine (BENTYL) 20 MG tablet Take 1 tablet (20 mg total) by mouth 2 (two) times daily as needed for spasms. 20 tablet 0   losartan (COZAAR) 100 MG tablet Take 1 tablet by mouth once daily 90 tablet 3   Multiple Vitamins-Minerals (OCUVITE ADULT 50+ PO) Take 1 tablet by mouth daily.     nitroGLYCERIN (NITROSTAT) 0.4 MG SL tablet Place 1 tablet (0.4 mg total) under the tongue every 5 (five) minutes x 3 doses as needed for chest pain. 25 tablet 2   PREVIDENT 5000 DRY MOUTH 1.1 % GEL dental gel Place 1 Application onto teeth at bedtime.     spironolactone (ALDACTONE) 25 MG tablet Take 1 tablet by mouth once daily 90 tablet 0    Prior to Admission medications   Medication Sig Start Date End Date Taking? Authorizing Provider  amLODipine (NORVASC) 5 MG tablet Take 1 tablet by mouth once daily 03/11/22   [name], MD  aspirin EC 81 MG tablet Take 1 tablet (81 mg total) by mouth daily. 11/16/15   Martinique, Peter M, MD  atorvastatin (LIPITOR) 10 MG tablet Take 1 tablet (10 mg total) by mouth every other day. 01/10/22   [name], MD  carvedilol (COREG) 3.125 MG tablet Take 1 tablet by mouth twice daily 02/21/22   Martinique, Peter M, MD  Cyanocobalamin (VITAMIN B-12) 5000 MCG TBDP Take 5,000 mcg by mouth every morning.    [provider]  dicyclomine (BENTYL) 20 MG  tablet Take 1 tablet (20 mg total) by mouth 2 (two) times daily as needed for spasms. 03/19/22   Kommor, Debe Coder, MD  losartan (COZAAR) 100 MG tablet Take 1 tablet by mouth once daily 07/22/21   Martinique, Peter M, MD  Multiple Vitamins-Minerals (OCUVITE ADULT 50+ PO) Take 1 tablet by mouth daily. 03/11/19   [provider]  nitroGLYCERIN (NITROSTAT) 0.4 MG SL tablet Place 1 tablet (0.4 mg total) under the tongue every 5 (five) minutes x 3 doses as needed for chest pain. 12/27/20   [name], MD  PREVIDENT 5000 DRY MOUTH 1.1 % GEL dental gel Place 1 Application onto teeth at bedtime. 11/20/21   [provider]  spironolactone (ALDACTONE) 25 MG tablet Take 1 tablet by mouth once daily 12/30/21   Martinique, Peter M, MD    Blood pressure 132/66, pulse (!) 56, temperature 97.6 F (36.4 C), temperature source Oral, resp. rate 17, weight 90.5 kg, SpO2 96 %. Physical Exam: General: pleasant elderly male who is laying in bed in NAD HEENT: head is normocephalic, atraumatic.  Sclera are noninjected.  Pupils equal and round.  Ears and nose without any masses or lesions.  Mouth is pink and moist. Dentition fair Heart: regular, rate, and rhythm Lungs: CTAB, no wheezes, rhonchi, or rales noted.  Respiratory effort nonlabored on room air Abd: multiple well healed abdominal incisions, soft, mild distension, no masses, hernias, or organomegaly. Focally tender in the RLQ without peritonitis Skin: warm and dry with no masses, lesions, or rashes Psych: A&Ox4 with an appropriate affect Neuro: MAEs, no gross motor or sensory deficits BUE/BLE  Results for orders placed or performed during the hospital encounter of 03/20/22 (from the past 48 hour(s))  Urinalysis, Routine w reflex microscopic     Status: Abnormal   Collection Time: 03/20/22  4:08 AM  Result Value Ref Range   Color, Urine YELLOW YELLOW   APPearance CLEAR CLEAR   Specific Gravity, Urine 1.014 1.005 - 1.030   pH 5.0 5.0 - 8.0    Glucose, UA NEGATIVE NEGATIVE mg/dL   Hgb urine dipstick SMALL (A) NEGATIVE   Bilirubin Urine NEGATIVE NEGATIVE   Ketones, ur NEGATIVE NEGATIVE mg/dL   Protein, ur NEGATIVE NEGATIVE mg/dL   Nitrite NEGATIVE NEGATIVE   Leukocytes,Ua NEGATIVE NEGATIVE   RBC / HPF 0-5 0 - 5 RBC/hpf   WBC, UA 0-5 0 - 5 WBC/hpf   Bacteria, UA NONE SEEN NONE SEEN   Squamous Epithelial / HPF 0-5 0 - 5 /HPF   Mucus PRESENT     Comment: Performed at [facility], [place] [address] [place], [place] [address]  Lipase, blood     Status: None   Collection Time: 03/20/22  8:47 PM  Result Value Ref Range   Lipase 30 11 - 51 U/L    Comment: Performed at [facility], [place] [address] [place], [place] [address]  Comprehensive metabolic panel     Status: Abnormal   Collection Time: 03/20/22  8:47 PM  Result Value Ref Range   Sodium 132 (L) 135 - 145 mmol/L   Potassium 3.9 3.5 - 5.1 mmol/L   Chloride 102 98 - 111 mmol/L   CO2 24 22 - 32 mmol/L   Glucose, Bld 117 (H) 70 - 99 mg/dL    Comment: Glucose reference range applies only to samples taken after fasting for at least 8 hours.   BUN 30 (H) 8 - 23 mg/dL   Creatinine, Ser 1.21 0.61 - 1.24 mg/dL   Calcium 8.8 (L) 8.9 - 10.3 mg/dL   Total Protein 7.0 6.5 - 8.1 g/dL   Albumin 3.6 3.5 - 5.0 g/dL   AST 13 (L) 15 - 41 U/L   ALT 10 0 - 44 U/L   Alkaline Phosphatase 68 38 - 126 U/L   Total Bilirubin 1.8 (H) 0.3 - 1.2 mg/dL   GFR, Estimated 59 (L) >60 mL/min    Comment: (NOTE) Calculated using the CKD-EPI Creatinine Equation (2021)    Anion gap 6 5 - 15    Comment: Performed at [facility], [place] [address], [place], [place] [address]  CBC     Status: Abnormal   Collection Time: 03/20/22  8:47 PM  Result Value Ref Range   WBC 11.4 (H) 4.0 - 10.5 K/uL   RBC 4.13 (L) 4.22 - 5.81 MIL/uL   Hemoglobin 13.2 13.0 - 17.0 g/dL   HCT 40.3 39.0 - 52.0 %   MCV 97.6 80.0 - 100.0 fL   MCH 32.0 26.0 - 34.0 pg   MCHC 32.8  30.0 - 36.0 g/dL   RDW 14.4 11.5 - 15.5 %   Platelets 277 150 - 400 K/uL   nRBC 0.0 0.0 - 0.2 %    Comment: Performed at [facility], [place] [address] [place],  [address]  Comprehensive metabolic panel     Status: Abnormal   Collection Time: 03/21/22  3:42 AM  Result Value Ref Range   Sodium 134 (L) 135 - 145 mmol/L   Potassium 3.4 (L) 3.5 - 5.1 mmol/L   Chloride 104 98 - 111 mmol/L   CO2 25 22 - 32 mmol/L   Glucose, Bld 112 (H) 70 - 99 mg/dL    Comment: Glucose reference range applies only to samples taken after fasting  for at least 8 hours.   BUN 29 (H) 8 - 23 mg/dL   Creatinine, Ser 1.04 0.61 - 1.24 mg/dL   Calcium 8.3 (L) 8.9 - 10.3 mg/dL   Total Protein 5.8 (L) 6.5 - 8.1 g/dL   Albumin 3.0 (L) 3.5 - 5.0 g/dL   AST 18 15 - 41 U/L   ALT 12 0 - 44 U/L   Alkaline Phosphatase 59 38 - 126 U/L   Total Bilirubin 1.8 (H) 0.3 - 1.2 mg/dL   GFR, Estimated >60 >60 mL/min    Comment: (NOTE) Calculated using the CKD-EPI Creatinine Equation (2021)    Anion gap 5 5 - 15    Comment: Performed at [facility], [place] [address] [place], [place] [address]  CBC     Status: Abnormal   Collection Time: 03/21/22  3:42 AM  Result Value Ref Range   WBC 9.5 4.0 - 10.5 K/uL   RBC 3.76 (L) 4.22 - 5.81 MIL/uL   Hemoglobin 12.2 (L) 13.0 - 17.0 g/dL   HCT 37.1 (L) 39.0 - 52.0 %   MCV 98.7 80.0 - 100.0 fL   MCH 32.4 26.0 - 34.0 pg   MCHC 32.9 30.0 - 36.0 g/dL   RDW 14.2 11.5 - 15.5 %   Platelets 227 150 - 400 K/uL   nRBC 0.0 0.0 - 0.2 %    Comment: Performed at [facility], [place] [address], [place], [place] [address]   CT ABDOMEN PELVIS WO CONTRAST  Result Date: 03/20/2022 CLINICAL DATA:  Status post recent colonoscopy with subsequent worsening lower abdominal pain. EXAM: CT ABDOMEN AND PELVIS WITHOUT CONTRAST TECHNIQUE: Multidetector CT imaging of the abdomen and pelvis was performed following the standard protocol without IV contrast.  RADIATION DOSE REDUCTION: This exam was performed according to the departmental dose-optimization program which includes automated exposure control, adjustment of the mA and/or kV according to patient size and/or use of iterative reconstruction technique. COMPARISON:  March 19, 2022 (10:49 a.m.) FINDINGS: Lower chest: No acute abnormality. Hepatobiliary: No focal liver abnormality is seen. Status post cholecystectomy. No biliary dilatation. Pancreas: Unremarkable. No pancreatic ductal dilatation or surrounding inflammatory changes. Spleen: Normal in size without focal abnormality. Adrenals/Urinary Tract: Stable diffuse bilateral adrenal gland enlargement is noted. Kidneys are normal in size, without renal calculi or hydronephrosis. Stable bilateral renal cysts are seen. Bladder is unremarkable. Stomach/Bowel: Stomach is within normal limits. Appendix appears normal. Surgically anastomosed bowel is seen within the mid sigmoid colon. Numerous surgical clips are seen scattered throughout the abdomen. Multiple dilated small bowel loops are seen throughout the mid to lower abdomen and pelvis (maximum small bowel diameter of approximately 3.9 cm). A transition zone is suspected within the pelvis on the left. Vascular/Lymphatic: Aortic atherosclerosis. Stable, enlarged para-aortic lymph nodes are seen (the largest measures 4.4 cm x 2.8 cm). A stable 2.2 cm x 3.2 cm aortocaval lymph node is also noted. Reproductive: Radiotherapy implants are seen within the prostate gland. Other: No abdominal wall hernia or abnormality. No abdominopelvic ascites or intra abdominal free air. Musculoskeletal: Multilevel marked severity degenerative changes are seen throughout the lumbar spine. Multiple subcortical lucencies are also seen involving multiple lumbar spine vertebral bodies. IMPRESSION: 1. Small-bowel obstruction with a suspected transition zone within the pelvis on the left. 2. Stable retroperitoneal lymphadenopathy consistent  with the patient's history of lymphoma. 3. Stable bilateral renal cysts. No follow-up imaging is recommended. This recommendation follows ACR consensus guidelines: Management of the Incidental Renal Mass on CT:  A White Paper of the ACR Incidental Findings Committee. J Am Coll Radiol [id number]. 4. Evidence of prior cholecystectomy and prior sigmoid colon resection. 5. Multiple lytic lesions involving multiple lumbar spine vertebral bodies, consistent with osseous metastasis. 6. Aortic atherosclerosis. 7. Stable diffuse bilateral adrenal gland enlargement which may represent adrenal hyperplasia. Correlation with adrenal function tests is recommended. Aortic Atherosclerosis (ICD10-I70.0). Electronically Signed   By: Virgina Norfolk M.D.   On: 03/20/2022 21:41   CT ABDOMEN PELVIS W CONTRAST  Result Date: 03/19/2022 CLINICAL DATA:  Right lower quadrant abdominal pain. EXAM: CT ABDOMEN AND PELVIS WITH CONTRAST TECHNIQUE: Multidetector CT imaging of the abdomen and pelvis was performed using the standard protocol following bolus administration of intravenous contrast. RADIATION DOSE REDUCTION: This exam was performed according to the departmental dose-optimization program which includes automated exposure control, adjustment of the mA and/or kV according to patient size and/or use of iterative reconstruction technique. CONTRAST:  136m OMNIPAQUE IOHEXOL 300 MG/ML  SOLN COMPARISON:  CT of the abdomen and pelvis with contrast 07/15/2021. FINDINGS: Lower chest: Mild dependent atelectasis is present. Lungs are otherwise clear. Heart size is upper limits of normal. Coronary artery calcifications are present. Hepatobiliary: Mild intra and extrahepatic biliary dilation has progressed no focal obstruction is present. There may be some mass effect from the 4 cm duodenal diverticulum. No inflammatory changes are associated. Pancreas: Unremarkable. No pancreatic ductal dilatation or surrounding inflammatory changes.  Spleen: Normal in size without focal abnormality. Adrenals/Urinary Tract: Adrenal glands are within normal limits bilaterally. Bilateral simple cysts of both kidneys scratched at bilateral simple cysts are stable. No follow-up imaging is recommended. No solid lesions are present. No stone or obstruction is present. Ureters are within normal limits bilaterally. The urinary bladder is unremarkable. Stomach/Bowel: The stomach is otherwise within normal limits. The duodenal diverticulum is stable. Proximal small bowel is within normal limits. Small lymph nodes in some edema are present throughout the mesentery. Fluid levels are present within nondilated loops of bowel. Loops are present in the right lower quadrant adjacent to the peritoneum with some wall thickening and surrounding inflammatory changes. No obstruction is present. Terminal ileum is within normal limits. Appendix is not discretely visualized and may be surgically absent. The ascending and transverse colon are within normal limits. Surgical clips are noted in the left upper quadrant. Descending colon is normal. Sigmoid anastomosis is intact. Sigmoid colon is within normal limits. Vascular/Lymphatic: Atherosclerotic calcifications are present in the aorta and branch vessels without aneurysm. Enlarged retroperitoneal lymph nodes are stable. Previously measured left periaortic nodal cluster is stable measuring 4.6 x 3.6 cm on image 34 of series 2. a right periaortic node on image 35 of series 2 is also stable. No new or enlarging nodes are present. Reproductive: Brachy therapy CT noted in the prostate gland. Seminal vesicles are within normal limits. Other: No significant ventral hernias are present. No free fluid or free air is present. Previous anterior abdominal scars are noted. Musculoskeletal: Lucent lesion at L2 is stable and chronic. Subcortical lucencies at L3 and L4 likely degenerative. These are also chronic. Remote superior endplate fracture of L1  is noted. No new lesions are present. Degenerative changes are noted at the ischial tuberosities bilaterally. Degenerative changes of the hips are stable IMPRESSION: 1. Fluid levels within nondilated loops of bowel with some wall thickening and surrounding inflammatory changes in the right lower quadrant adjacent to the peritoneum. This is concerning for a nonspecific enteritis. Adhesions may contribute. No obstruction is present. 2. Enlarged retroperitoneal  lymph nodes are stable, consistent with the history of lymphoma. No evidence for disease progression. 3. Stable 4 cm duodenal diverticulum. 4. Coronary artery disease. 5. Mild intra and extrahepatic biliary dilation has progressed no focal obstruction is present. There may be some mass effect from the 4 cm duodenal diverticulum. No inflammatory changes are associated. 6.  Aortic Atherosclerosis (ICD10-I70.0). Electronically Signed   By: San Morelle M.D.   On: 03/19/2022 11:14   DG Abdomen 1 View  Result Date: 03/19/2022 CLINICAL DATA:  Concern for bowel obstruction. Lower abdominal pain. EXAM: ABDOMEN - 1 VIEW COMPARISON:  CT abdomen and pelvis 07/15/2021 FINDINGS: Scattered bowel gas throughout the abdomen and pelvis. There is gas within the small and large bowel. Nonobstructive bowel gas pattern. Bowel postsurgical changes in the pelvis and brachytherapy seeds in the prostate. Mild levoscoliosis in lumbar spine with multilevel degenerative changes in lumbar spine. Surgical clips in the upper abdomen. Chronic calcification in the medial right abdomen. IMPRESSION: 1. Nonobstructive bowel gas pattern. 2. Postsurgical changes in the abdomen and pelvis. Electronically Signed   By: Markus Daft M.D.   On: 03/19/2022 09:50    Anti-infectives (From admission, onward)    None        Assessment/Plan Recurrent SBO - last SBO 11/2020, he reports 2-3 total - multiple prior abdominal surgeries (listed above) - CT 1/11 shows SBO with suspected  transition zone within the pelvis on the left - No indication for acute surgical intervention. Will order NG tube for decompression and start patient on SBO protocol. He has had multiple prior abdominal surgeries and this could be adhesive in nature, but he also has a history of lymphoma and I'm unsure if this is secondary to his lymphoma? Either way would treat the same at this point with NG tube and SBO protocol.  We will follow.  ID - none VTE - lovenox FEN - IVF, NPO/NGT to LIWS Foley - none  Follicular lymphoma - follows with Dr. Lorenso Courier, looks like bone mets are new on this CT scan compared to previous 07/2021, he will need outpatient oncology follow up CAD/ hx MI 2017 s/p stent placement Hx embolic CVA secondary to PCI procedure HTN HLD Hx prostate cancer s/p XRT and radiation seed implantation 2010 OSA on CPAP  I reviewed hospitalist notes, last 24 h vitals and pain scores, last 48 h intake and output, last 24 h labs and trends, and last 24 h imaging results.   Wellington Hampshire, [facility] 03/21/2022, 9:01 AM Please see Amion for pager number during day hours 7:00am-4:30pm

## 2022-03-21 NOTE — Assessment & Plan Note (Addendum)
Small bowel obstruction protocol contrast study performed evening of 1/12 confirmed partial small bowel obstruction with 24-hour delay film revealing contrast progression throughout the colon. Obstruction likely secondary to adhesions due to patient's numerous abdominal surgeries  I reviewed the patient's case with Dr. Lorenso Courier with oncology on 1/12 who felt that patient's intra-abdominal lymphadenopathy is unlikely to be contributing to the patient's obstruction.   General surgery following daily, their assistance is appreciated. Per general surgery patient's diet can be advanced today to a soft diet If tolerating discharge today versus tomorrow. Monitoring electrolytes closely and replace as necessary As needed opiate based analgesics for associated pain.

## 2022-03-21 NOTE — Assessment & Plan Note (Signed)
Resolving with intravenous volume resuscitation. Monitoring sodium levels with serial chemistries.

## 2022-03-21 NOTE — Assessment & Plan Note (Addendum)
Replacing with intravenous magnesium sulfate Monitoring magnesium levels with serial chemistries.   

## 2022-03-21 NOTE — Assessment & Plan Note (Addendum)
Resuming home antihypertensive regimen As needed intravenous antihypertensives for markedly elevated blood pressure.

## 2022-03-21 NOTE — Hospital Course (Addendum)
88 year old male with past medical history follicular lymphoma (follows with Dr. Lorenso Courier, not on treatment),  severe 3-vessel coronary artery disease (S/P STEMI 10/2015 with DES to LAD and RCA), ischemic cardiomyopathy (Echo 10/2015 45-50% with G1DD, repeat echo 07/2021 unable to get EF), prior embolic stroke after 9381 PCI, hypertension, hyperlipidemia, prostate cancer (S/P radiation and seed implantation 2010), obstructive sleep apnea on CPAP presenting to [facility] 1/11 with complaints of right lower quadrant abdominal pain.  Of note, patient has a history of multiple abdominal surgeries including colostomy and reversal.  Patient additionally has a history of multiple small bowel obstructions.  Patient reports that he had a colonoscopy approximately 1 week ago with abdominal pain that began thereafter.  Upon evaluation in the emergency department on this presentation, CT imaging of the abdomen and pelvis revealed recurrent small bowel obstruction with suspected transition zone within the left pelvis.  Hospitalist group was contacted and patient was admitted to the hospital.  General surgery was consulted.  NG tube was placed and set to low intermittent suction while patient was kept NPO.  Obstruction was evaluated by a Gastrografin study with small bowel protocol.  In the days that followed patient's abdominal pain, abdominal distention and nausea gradually improved.  Eventually, NG tube was removed and patient was able to tolerate a clear liquid diet which was advanced as tolerated.

## 2022-03-21 NOTE — Assessment & Plan Note (Addendum)
Replacing with potassium chloride Treating concurrent hypomagnesemia with intravenous magnesium sulfate Monitoring potassium levels with serial chemistries.

## 2022-03-21 NOTE — Outcomes Assessment (Signed)
GASTROGRAFIN given now

## 2022-03-21 NOTE — OR Nursing (Signed)
Recived verbal order  to advance 3.3cm

## 2022-03-21 NOTE — Assessment & Plan Note (Addendum)
Patient is currently chest pain free As needed sublingual nitroglycerin for episodic chest pain Resuming aspirin, beta-blocker and statin therapy

## 2022-03-21 NOTE — Progress Notes (Signed)
PROGRESS NOTE   Jose Ware  KWI:[id number] DOB: September 03, 1934 DOA: 03/20/2022 PCP: [name], MD   Date of Service: the patient was seen and examined on 03/21/2022  Brief Narrative:  88 year old male with past medical history follicular lymphoma (follows with Dr. Lorenso Courier, not on treatment),  severe 3-vessel coronary artery disease (S/P STEMI 10/2015 with DES to LAD and RCA), ischemic cardiomyopathy (Echo 10/2015 45-50% with G1DD, repeat echo 07/2021 unable to get EF), prior embolic stroke after 2426 PCI, hypertension, hyperlipidemia, prostate cancer (S/P radiation and seed implantation 2010), obstructive sleep apnea on CPAP presenting to [facility] long hospital 1/11 with complaints of right lower quadrant abdominal pain.  Of note, patient has a history of multiple abdominal surgeries including colostomy and reversal.  Patient additionally has a history of multiple small bowel obstructions.  Patient reports that he had a colonoscopy approximately 1 week ago with abdominal pain that began thereafter.  Upon evaluation in the emergency department on this presentation, CT imaging of the abdomen and pelvis reveals recurrent small bowel obstruction with suspected transition zone within the left pelvis.  Hospitalist group was contacted and patient was admitted to the hospital.  General surgery was consulted.  NG tube was placed and set to low intermittent suction while patient was kept NPO.   Assessment and Plan: * SBO (small bowel obstruction) (HCC) Small bowel obstruction noted on CT imaging Likely secondary to adhesions due to patient's numerous abdominal surgeries  I have reviewed the patient's case with Dr. Lorenso Courier with oncology who feels that patient's intra-abdominal lymphadenopathy is unlikely to be contributing to the patient's obstruction.   NG tube has been placed in the appropriate position and once Gastrografin study is complete will be set to low intermittent suction Hydrating  patient with intravenous isotonic fluids Monitoring electrolytes closely and replace as necessary  Hyponatremia Mild and likely secondary to volume depletion Hydrating with intravenous isotonic fluids  monitoring sodium levels with serial chemistries  Hypokalemia Replacing with potassium chloride Treating concurrent hypomagnesemia with intravenous magnesium sulfate Monitoring potassium levels with serial chemistries.   Chronic diastolic CHF (congestive heart failure) (HCC) No clinical evidence of cardiogenic volume overload Monitor for developing volume overload with continuous intravenous fluids Strict input and output monitoring Daily weights   Coronary artery disease involving native coronary artery of native heart without angina pectoris Patient is currently chest pain free As needed sublingual nitroglycerin for episodic chest pain Will resume outpatient regimen of aspirin and statin therapy once taking p.o. again    Essential hypertension Currently normotensive while holding patient's usual regimen of antihypertensive therapy As needed intravenous angiotensins for markedly elevated blood pressure.  Mixed hyperlipidemia Holding statins until patient is able to tolerate oral intake again  Follicular lymphoma, unspecified, intra-abdominal lymph nodes ([place]) Follows with Dr. Lorenso Courier in the outpatient setting No current active treatment Case discussed with Dr. Lorenso Courier earlier today, his input is appreciated. Patient to continue to follow-up as an outpatient at discharge.  OSA on CPAP Holding off on CPAP due to bowel obstruction supplemental oxygen to be provided nightly instead  Hypomagnesemia Replacing with intravenous magnesium sulfate Monitoring magnesium levels with serial chemistries.         Subjective:  Patient complaining of abdominal pain, severe in intensity, sharp in quality, radiating diffusely.  Pain is worse with movement and is associated with  transient nausea.  Physical Exam:  Vitals:   03/20/22 2332 03/21/22 0337 03/21/22 0748 03/21/22 1130  BP:  125/64 132/66 (!) 144/75  Pulse:  Marland Kitchen)  56 (!) 56 (!) 56  Resp:  '16 17 19  '$ Temp:  98 F (36.7 C) 97.6 F (36.4 C) 98.4 F (36.9 C)  TempSrc:  Oral Oral Oral  SpO2:  96% 96% 96%  Weight: 90.5 kg       Constitutional: Awake alert and oriented x3, patient is in distress due to abdominal pain. Skin: no rashes, no lesions, good skin turgor noted. Eyes: Pupils are equally reactive to light.  No evidence of scleral icterus or conjunctival pallor.  ENMT: NG tube in place.  Dry mucous membranes noted.  Posterior pharynx clear of any exudate or lesions.   Respiratory: clear to auscultation bilaterally, no wheezing, no crackles. Normal respiratory effort. No accessory muscle use.  Cardiovascular: Regular rate and rhythm, no murmurs / rubs / gallops. No extremity edema. 2+ pedal pulses. No carotid bruits.  Abdomen: Slightly distended abdomen that is soft with hypoactive bowel sounds.  No evidence of intra-abdominal masses.  Positive bowel sounds noted in all quadrants.   Musculoskeletal: No joint deformity upper and lower extremities. Good ROM, no contractures. Normal muscle tone.    Data Reviewed:  I have personally reviewed and interpreted labs, imaging.  Significant findings are   CBC: Recent Labs  Lab 03/19/22 0905 03/20/22 2047 03/21/22 0342  WBC 12.3* 11.4* 9.5  HGB 13.5 13.2 12.2*  HCT 40.8 40.3 37.1*  MCV 96.9 97.6 98.7  PLT 249 277 741   Basic Metabolic Panel: Recent Labs  Lab 03/19/22 0905 03/20/22 2047 03/21/22 0342  NA 136 132* 134*  K 3.9 3.9 3.4*  CL 103 102 104  CO2 '24 24 25  '$ GLUCOSE 155* 117* 112*  BUN 30* 30* 29*  CREATININE 1.10 1.21 1.04  CALCIUM 9.1 8.8* 8.3*  MG  --   --  1.5*   GFR: Estimated Creatinine Clearance: 58.7 mL/min (by C-G formula based on SCr of 1.04 mg/dL). Liver Function Tests: Recent Labs  Lab 03/19/22 0905 03/20/22 2047  03/21/22 0342  AST 14* 13* 18  ALT '12 10 12  '$ ALKPHOS 71 68 59  BILITOT 1.7* 1.8* 1.8*  PROT 7.3 7.0 5.8*  ALBUMIN 3.9 3.6 3.0*     Code Status:  Full code.  Code status decision has been confirmed with: patient Family Communication: deferred    Severity of Illness:  The appropriate patient status for this patient is INPATIENT. Inpatient status is judged to be reasonable and necessary in order to provide the required intensity of service to ensure the patient's safety. The patient's presenting symptoms, physical exam findings, and initial radiographic and laboratory data in the context of their chronic comorbidities is felt to place them at high risk for further clinical deterioration. Furthermore, it is not anticipated that the patient will be medically stable for discharge from the hospital within 2 midnights of admission.   * I certify that at the point of admission it is my clinical judgment that the patient will require inpatient hospital care spanning beyond 2 midnights from the point of admission due to high intensity of service, high risk for further deterioration and high frequency of surveillance required.*  Time spent:  56 minutes  Author:  Vernelle Emerald MD  03/21/2022 6:20 PM

## 2022-03-21 NOTE — Assessment & Plan Note (Signed)
Follows with Dr. Lorenso Courier in the outpatient setting No current active treatment Case discussed with Dr. Lorenso Courier on admission, his input is appreciated. Patient to continue to follow-up as an outpatient at discharge.

## 2022-03-21 NOTE — Assessment & Plan Note (Addendum)
Resuming home regimen of statin therapy

## 2022-03-21 NOTE — Assessment & Plan Note (Signed)
Holding off on CPAP due to bowel obstruction supplemental oxygen to be provided nightly instead

## 2022-03-22 ENCOUNTER — Inpatient Hospital Stay (HOSPITAL_COMMUNITY): Payer: Medicare Other

## 2022-03-22 DIAGNOSIS — E876 Hypokalemia: Secondary | ICD-10-CM | POA: Diagnosis not present

## 2022-03-22 DIAGNOSIS — K56609 Unspecified intestinal obstruction, unspecified as to partial versus complete obstruction: Secondary | ICD-10-CM | POA: Diagnosis not present

## 2022-03-22 DIAGNOSIS — C8263 Cutaneous follicle center lymphoma, intra-abdominal lymph nodes: Secondary | ICD-10-CM

## 2022-03-22 DIAGNOSIS — E871 Hypo-osmolality and hyponatremia: Secondary | ICD-10-CM | POA: Diagnosis not present

## 2022-03-22 LAB — COMPREHENSIVE METABOLIC PANEL
ALT: 12 U/L (ref 0–44)
AST: 12 U/L — ABNORMAL LOW (ref 15–41)
Albumin: 3.1 g/dL — ABNORMAL LOW (ref 3.5–5.0)
Alkaline Phosphatase: 59 U/L (ref 38–126)
Anion gap: 9 (ref 5–15)
BUN: 25 mg/dL — ABNORMAL HIGH (ref 8–23)
CO2: 23 mmol/L (ref 22–32)
Calcium: 8.2 mg/dL — ABNORMAL LOW (ref 8.9–10.3)
Chloride: 104 mmol/L (ref 98–111)
Creatinine, Ser: 0.92 mg/dL (ref 0.61–1.24)
GFR, Estimated: >60 mL/min (ref >60)
Glucose, Bld: 102 mg/dL — ABNORMAL HIGH (ref 70–99)
Potassium: 3.9 mmol/L (ref 3.5–5.1)
Sodium: 136 mmol/L (ref 135–145)
Total Bilirubin: 2.1 mg/dL — ABNORMAL HIGH (ref 0.3–1.2)
Total Protein: 5.6 g/dL — ABNORMAL LOW (ref 6.5–8.1)

## 2022-03-22 LAB — CBC
HCT: 39.9 % (ref 39.0–52.0)
Hemoglobin: 13 g/dL (ref 13.0–17.0)
MCH: 32.5 pg (ref 26.0–34.0)
MCHC: 32.6 g/dL (ref 30.0–36.0)
MCV: 99.8 fL (ref 80.0–100.0)
Platelets: 234 10*3/uL (ref 150–400)
RBC: 4 MIL/uL — ABNORMAL LOW (ref 4.22–5.81)
RDW: 14.2 % (ref 11.5–15.5)
WBC: 10 10*3/uL (ref 4.0–10.5)
nRBC: 0 % (ref 0.0–0.2)

## 2022-03-22 LAB — MAGNESIUM: Magnesium: 1.9 mg/dL (ref 1.7–2.4)

## 2022-03-22 MED ORDER — PANTOPRAZOLE SODIUM 40 MG IV SOLR: 40.0000 mg | INTRAVENOUS | Status: DC

## 2022-03-22 MED ORDER — PANTOPRAZOLE SODIUM 40 MG IV SOLR
40.0000 mg | Freq: Two times a day (BID) | INTRAVENOUS | Status: DC
  Administered 2022-03-22 – 2022-03-29 (×14): 40 mg via INTRAVENOUS
  Filled 2022-03-22 (×14): qty 10

## 2022-03-22 NOTE — Progress Notes (Signed)
Patient ID: TARIS GALINDO, male   DOB: January 05, 1935, 88 y.o.   MRN: [id number]   Acute Care Surgery Service Progress Note:    Chief Complaint/Subjective: No abd pain No complaints Tiny BM but no flatus  Objective: Vital signs in last 24 hours: Temp:  [98.4 F (36.9 C)-99.4 F (37.4 C)] 99.4 F (37.4 C) (01/12 1932) Pulse Rate:  [56-60] 60 (01/12 1932) Resp:  [18-19] 18 (01/12 1932) BP: (144-174)/(73-75) 174/73 (01/12 1932) SpO2:  [95 %-96 %] 95 % (01/12 1932) Last BM Date : 03/20/22  Intake/Output from previous day: 01/12 0701 - 01/13 0700 In: -  Out: 1225 [Urine:1225] Intake/Output this shift: No intake/output data recorded.  Lungs: cta, nonlabored  Cardiovascular: reg  Abd: soft, multiple old incisions, very mild distension, not really TTP  Extremities: no edema, +SCDs  Neuro: alert, nonfocal  Lab Results: CBC  Recent Labs    03/21/22 0342 03/22/22 0538  WBC 9.5 10.0  HGB 12.2* 13.0  HCT 37.1* 39.9  PLT 227 234   BMET Recent Labs    03/21/22 0342 03/22/22 0538  NA 134* 136  K 3.4* 3.9  CL 104 104  CO2 25 23  GLUCOSE 112* 102*  BUN 29* 25*  CREATININE 1.04 0.92  CALCIUM 8.3* 8.2*   LFT    Latest Ref Rng & Units 03/22/2022    5:38 AM 03/21/2022    3:42 AM 03/20/2022    8:47 PM  Hepatic Function  Total Protein 6.5 - 8.1 g/dL 5.6  5.8  7.0   Albumin 3.5 - 5.0 g/dL 3.1  3.0  3.6   AST 15 - 41 U/L '12  18  13   '$ ALT 0 - 44 U/L '12  12  10   '$ Alk Phosphatase 38 - 126 U/L 59  59  68   Total Bilirubin 0.3 - 1.2 mg/dL 2.1  1.8  1.8    PT/INR No results for input(s): "LABPROT", "INR" in the last 72 hours. ABG No results for input(s): "PHART", "HCO3" in the last 72 hours.  Invalid input(s): "PCO2", "PO2"  Studies/Results:  Anti-infectives: Anti-infectives (From admission, onward)    None       Medications: Scheduled Meds:  enoxaparin (LOVENOX) injection  40 mg Subcutaneous Q24H   Continuous Infusions:  lactated ringers 1,000 mL with  potassium chloride 20 mEq infusion 100 mL/hr at 03/21/22 2126   PRN Meds:.hydrALAZINE, morphine injection **OR** morphine injection, ondansetron **OR** ondansetron (ZOFRAN) IV, phenol  Assessment/Plan: Patient Active Problem List   Diagnosis Date Noted   Hypokalemia 03/21/2022   Chronic diastolic CHF (congestive heart failure) ([place]) 03/21/2022   Mixed hyperlipidemia 03/21/2022   Hypomagnesemia 03/21/2022   SBO (small bowel obstruction) ([place]) 03/20/2022   Hyponatremia 03/20/2022   Lymphoma ([place]) 03/20/2022   Benign neoplasm of transverse colon 03/13/2022   Benign neoplasm of descending colon 03/13/2022   Benign neoplasm of sigmoid colon 03/13/2022   Benign neoplasm of rectum 36/64/4034   Follicular lymphoma, unspecified, intra-abdominal lymph nodes ([place]) 04/11/2021   History of small bowel obstruction 11/13/2020   OSA on CPAP 05/08/2020   Pain of back and left lower extremity 06/08/2019   Left knee pain 06/06/2019   Onychomycosis 10/28/2017   Spondylosis 06/25/2017   Post herpetic neuralgia 06/20/2017   Spinal stenosis of lumbar region at multiple levels 04/30/2017   Drug reaction 02/05/2017   Senile purpura ([place]) 02/05/2017   Dyslipidemia 11/16/2015   History of ventricular tachycardia 11/08/2015   Coronary artery disease involving native coronary  artery of native heart without angina pectoris    Ischemic cardiomyopathy    History of embolic stroke    Post-traumatic osteoarthritis of right wrist 04/25/2015   Hx of adenomatous colonic polyps 01/18/2015   History of primary hyperparathyroidism s/p parathyroidectomy (benign adenoma) 12/28/2013   History of stomach cancer 12/01/2013   Fatigue 12/01/2013   Lower back pain 03/24/2013   Bradycardia 09/23/2012   B12 deficiency 09/17/2010   History of prostate cancer 12/14/2008   PERIPHERAL NEUROPATHY 10/18/2007   PSORIASIS 10/15/2007   Essential hypertension 11/27/2006   DEGENERATIVE JOINT DISEASE 11/27/2006   NEPHROLITHIASIS,  HX OF 11/27/2006   Recurrent SBO - last SBO 11/2020, he reports 2-3 total - multiple prior abdominal surgeries (listed above) - CT 1/11 shows SBO with suspected transition zone within the pelvis on the left - No indication for acute surgical intervention - no fever, no tachy, no wbc, exam stable - cont SBO protocol, film reviewed - contrast still in SB and SB still distended. Repeat delayed film sun am. He has had multiple prior abdominal surgeries and this could be adhesive in nature, We will follow.   ID - none VTE - lovenox FEN - IVF, NPO/NGT to LIWS Foley - none   Follicular lymphoma - follows with Dr. Lorenso Courier, looks like bone mets are new on this CT scan compared to previous 07/2021, he will need outpatient oncology follow up CAD/ hx MI 2017 s/p stent placement Hx embolic CVA secondary to PCI procedure HTN HLD Hx prostate cancer s/p XRT and radiation seed implantation 2010 OSA on CPAP   I reviewed hospitalist notes, last 24 h vitals and pain scores, last 48 h intake and output, last 24 h labs and trends, and last 24 h imaging results. Disposition:  LOS: 2 days    Leighton Ruff. Redmond Pulling, MD, FACS General, Bariatric, & Minimally Invasive Surgery [phone] [facility]

## 2022-03-22 NOTE — Progress Notes (Signed)
PROGRESS NOTE   Jose Ware  ZOX:[id number] DOB: [birth year] DOA: 03/20/2022 PCP: [name], MD   Date of Service: the patient was seen and examined on 03/22/2022  Brief Narrative:  88 year old male with past medical history follicular lymphoma (follows with Dr. Lorenso Courier, not on treatment),  severe 3-vessel coronary artery disease (S/P STEMI 10/2015 with DES to LAD and RCA), ischemic cardiomyopathy (Echo 10/2015 45-50% with G1DD, repeat echo 07/2021 unable to get EF), prior embolic stroke after 8119 PCI, hypertension, hyperlipidemia, prostate cancer (S/P radiation and seed implantation 2010), obstructive sleep apnea on CPAP presenting to [facility] hospital 1/11 with complaints of right lower quadrant abdominal pain.  Of note, patient has a history of multiple abdominal surgeries including colostomy and reversal.  Patient additionally has a history of multiple small bowel obstructions.  Patient reports that he had a colonoscopy approximately 1 week ago with abdominal pain that began thereafter.  Upon evaluation in the emergency department on this presentation, CT imaging of the abdomen and pelvis reveals recurrent small bowel obstruction with suspected transition zone within the left pelvis.  Hospitalist group was contacted and patient was admitted to the hospital.  General surgery was consulted.  NG tube was placed and set to low intermittent suction while patient was kept NPO.   Assessment and Plan: * SBO (small bowel obstruction) (HCC) Small bowel obstruction protocol contrast study performed evening of 1/12 confirming partial small bowel obstruction Likely secondary to adhesions due to patient's numerous abdominal surgeries  I have reviewed the patient's case with Dr. Lorenso Courier with oncology on 1/12 who feels that patient's intra-abdominal lymphadenopathy is unlikely to be contributing to the patient's obstruction.   NG tube was accidentally dislodged overnight and had to be replaced  early in the morning on 1/13. Continuing to hydrate patient with intravenous isotonic fluids Repeat abdominal x-ray at morning of 1/14. Monitoring electrolytes closely and replace as necessary General surgery following along, their input is appreciated.  Hyponatremia Resolving with intravenous volume resuscitation. Monitoring sodium levels with serial chemistries.  Hypokalemia Replaced   Chronic diastolic CHF (congestive heart failure) (HCC) No clinical evidence of cardiogenic volume overload Monitor for developing volume overload with continuous intravenous fluids Strict input and output monitoring Daily weights   Coronary artery disease involving native coronary artery of native heart without angina pectoris Patient is currently chest pain free As needed sublingual nitroglycerin for episodic chest pain Will resume outpatient regimen of aspirin and statin therapy once taking p.o. again    Essential hypertension  As needed intravenous angiotensins for markedly elevated blood pressure.  Mixed hyperlipidemia Holding statins until patient is able to tolerate oral intake again  Follicular lymphoma, unspecified, intra-abdominal lymph nodes ([place]) Follows with Dr. Lorenso Courier in the outpatient setting No current active treatment Case discussed with Dr. Lorenso Courier earlier today, his input is appreciated. Patient to continue to follow-up as an outpatient at discharge.  OSA on CPAP Holding off on CPAP due to bowel obstruction supplemental oxygen to be provided nightly instead  Hypomagnesemia Replaced         Subjective:  Patient reports that his abdominal pain has improved somewhat since yesterday.  Pain is sharp in quality, diffuse in location, radiating diffusely, worse with movement.  Patient denies any associated nausea or vomiting.  Physical Exam:  Vitals:   03/21/22 0748 03/21/22 1130 03/21/22 1932 03/22/22 1300  BP: 132/66 (!) 144/75 (!) 174/73 (!) 162/77  Pulse:  (!) 56 (!) 56 60 62  Resp: '17 19 18 '$ 16  Temp: 97.6 F (36.4 C) 98.4 F (36.9 C) 99.4 F (37.4 C) 97.9 F (36.6 C)  TempSrc: Oral Oral    SpO2: 96% 96% 95% 95%  Weight:        Constitutional: Awake alert and oriented x3, patient is in distress due to abdominal pain. Skin: no rashes, no lesions, good skin turgor noted. Eyes: Pupils are equally reactive to light.  No evidence of scleral icterus or conjunctival pallor.  ENMT: NG tube in place.  Dry mucous membranes noted.  Posterior pharynx clear of any exudate or lesions.   Respiratory: clear to auscultation bilaterally, no wheezing, no crackles. Normal respiratory effort. No accessory muscle use.  Cardiovascular: Regular rate and rhythm, no murmurs / rubs / gallops. No extremity edema. 2+ pedal pulses. No carotid bruits.  Abdomen: Abdomen continues to be protuberant with hypoactive bowel sounds and increased tympany.  No evidence of intra-abdominal masses.  Positive bowel sounds noted in all quadrants.   Musculoskeletal: No joint deformity upper and lower extremities. Good ROM, no contractures. Normal muscle tone.    Data Reviewed:  I have personally reviewed and interpreted labs, imaging.  Significant findings are   CBC: Recent Labs  Lab 03/19/22 0905 03/20/22 2047 03/21/22 0342 03/22/22 0538  WBC 12.3* 11.4* 9.5 10.0  HGB 13.5 13.2 12.2* 13.0  HCT 40.8 40.3 37.1* 39.9  MCV 96.9 97.6 98.7 99.8  PLT 249 277 227 220   Basic Metabolic Panel: Recent Labs  Lab 03/19/22 0905 03/20/22 2047 03/21/22 0342 03/22/22 0538  NA 136 132* 134* 136  K 3.9 3.9 3.4* 3.9  CL 103 102 104 104  CO2 '24 24 25 23  '$ GLUCOSE 155* 117* 112* 102*  BUN 30* 30* 29* 25*  CREATININE 1.10 1.21 1.04 0.92  CALCIUM 9.1 8.8* 8.3* 8.2*  MG  --   --  1.5* 1.9   GFR: Estimated Creatinine Clearance: 66.3 mL/min (by C-G formula based on SCr of 0.92 mg/dL). Liver Function Tests: Recent Labs  Lab 03/19/22 0905 03/20/22 2047 03/21/22 0342  03/22/22 0538  AST 14* 13* 18 12*  ALT '12 10 12 12  '$ ALKPHOS 71 68 59 59  BILITOT 1.7* 1.8* 1.8* 2.1*  PROT 7.3 7.0 5.8* 5.6*  ALBUMIN 3.9 3.6 3.0* 3.1*     Code Status:  Full code.  Code status decision has been confirmed with: patient Family Communication: deferred    Severity of Illness:  The appropriate patient status for this patient is INPATIENT. Inpatient status is judged to be reasonable and necessary in order to provide the required intensity of service to ensure the patient's safety. The patient's presenting symptoms, physical exam findings, and initial radiographic and laboratory data in the context of their chronic comorbidities is felt to place them at high risk for further clinical deterioration. Furthermore, it is not anticipated that the patient will be medically stable for discharge from the hospital within 2 midnights of admission.   * I certify that at the point of admission it is my clinical judgment that the patient will require inpatient hospital care spanning beyond 2 midnights from the point of admission due to high intensity of service, high risk for further deterioration and high frequency of surveillance required.*  Time spent:  54 minutes  Author:  Vernelle Emerald MD  03/22/2022 10:29 PM

## 2022-03-22 NOTE — Progress Notes (Addendum)
CROSS COVER NOTE  NAME: Jose Ware MRN: [id number] DOB : 31-Dec-1934    Date of Service   03/22/2022   HPI/Events of Note   Notified by RN of accidental NG tube removal.    Per RN, NG tube slipped out of patient's nares while ambulating from the bathroom.  RN to reinsert NG tube in order to continue SBO therapy. KUB ordered for tube verification.   Interventions/ Plan          Raenette Rover, DNP, [place]

## 2022-03-23 ENCOUNTER — Inpatient Hospital Stay (HOSPITAL_COMMUNITY): Payer: Medicare Other

## 2022-03-23 DIAGNOSIS — E871 Hypo-osmolality and hyponatremia: Secondary | ICD-10-CM | POA: Diagnosis not present

## 2022-03-23 DIAGNOSIS — C8104 Nodular lymphocyte predominant Hodgkin lymphoma, lymph nodes of axilla and upper limb: Secondary | ICD-10-CM

## 2022-03-23 DIAGNOSIS — K56609 Unspecified intestinal obstruction, unspecified as to partial versus complete obstruction: Secondary | ICD-10-CM | POA: Diagnosis not present

## 2022-03-23 DIAGNOSIS — I5032 Chronic diastolic (congestive) heart failure: Secondary | ICD-10-CM | POA: Diagnosis not present

## 2022-03-23 DIAGNOSIS — E876 Hypokalemia: Secondary | ICD-10-CM | POA: Diagnosis not present

## 2022-03-23 LAB — COMPREHENSIVE METABOLIC PANEL
ALT: 10 U/L (ref 0–44)
AST: 11 U/L — ABNORMAL LOW (ref 15–41)
Albumin: 2.9 g/dL — ABNORMAL LOW (ref 3.5–5.0)
Alkaline Phosphatase: 57 U/L (ref 38–126)
Anion gap: 7 (ref 5–15)
BUN: 26 mg/dL — ABNORMAL HIGH (ref 8–23)
CO2: 26 mmol/L (ref 22–32)
Calcium: 8.2 mg/dL — ABNORMAL LOW (ref 8.9–10.3)
Chloride: 102 mmol/L (ref 98–111)
Creatinine, Ser: 1.02 mg/dL (ref 0.61–1.24)
GFR, Estimated: >60 mL/min (ref >60)
Glucose, Bld: 80 mg/dL (ref 70–99)
Potassium: 4 mmol/L (ref 3.5–5.1)
Sodium: 135 mmol/L (ref 135–145)
Total Bilirubin: 2.2 mg/dL — ABNORMAL HIGH (ref 0.3–1.2)
Total Protein: 5.3 g/dL — ABNORMAL LOW (ref 6.5–8.1)

## 2022-03-23 LAB — CBC WITH DIFFERENTIAL/PLATELET
Abs Immature Granulocytes: 0.03 10*3/uL (ref 0.00–0.07)
Basophils Absolute: 0 10*3/uL (ref 0.0–0.1)
Basophils Relative: 0 %
Eosinophils Absolute: 0.1 10*3/uL (ref 0.0–0.5)
Eosinophils Relative: 1 %
HCT: 38.4 % — ABNORMAL LOW (ref 39.0–52.0)
Hemoglobin: 12.4 g/dL — ABNORMAL LOW (ref 13.0–17.0)
Immature Granulocytes: 0 %
Lymphocytes Relative: 12 %
Lymphs Abs: 0.9 10*3/uL (ref 0.7–4.0)
MCH: 32 pg (ref 26.0–34.0)
MCHC: 32.3 g/dL (ref 30.0–36.0)
MCV: 99 fL (ref 80.0–100.0)
Monocytes Absolute: 1.1 10*3/uL — ABNORMAL HIGH (ref 0.1–1.0)
Monocytes Relative: 14 %
Neutro Abs: 5.9 10*3/uL (ref 1.7–7.7)
Neutrophils Relative %: 73 %
Platelets: 232 10*3/uL (ref 150–400)
RBC: 3.88 MIL/uL — ABNORMAL LOW (ref 4.22–5.81)
RDW: 14.3 % (ref 11.5–15.5)
WBC: 8.1 10*3/uL (ref 4.0–10.5)
nRBC: 0 % (ref 0.0–0.2)

## 2022-03-23 LAB — MAGNESIUM: Magnesium: 2.1 mg/dL (ref 1.7–2.4)

## 2022-03-23 MED ORDER — ENALAPRILAT 1.25 MG/ML IV SOLN
1.2500 mg | Freq: Four times a day (QID) | INTRAVENOUS | Status: DC
  Administered 2022-03-23 – 2022-03-25 (×11): 1.25 mg via INTRAVENOUS
  Filled 2022-03-23 (×12): qty 1

## 2022-03-23 NOTE — Progress Notes (Signed)
PROGRESS NOTE   Jose Ware  DPO:[id number] DOB: [birth year] DOA: 03/20/2022 PCP: [name], MD   Date of Service: the patient was seen and examined on 03/23/2022  Brief Narrative:  88 year old male with past medical history follicular lymphoma (follows with Dr. Lorenso Courier, not on treatment),  severe 3-vessel coronary artery disease (S/P STEMI 10/2015 with DES to LAD and RCA), ischemic cardiomyopathy (Echo 10/2015 45-50% with G1DD, repeat echo 07/2021 unable to get EF), prior embolic stroke after 4315 PCI, hypertension, hyperlipidemia, prostate cancer (S/P radiation and seed implantation 2010), obstructive sleep apnea on CPAP presenting to [facility] hospital 1/11 with complaints of right lower quadrant abdominal pain.  Of note, patient has a history of multiple abdominal surgeries including colostomy and reversal.  Patient additionally has a history of multiple small bowel obstructions.  Patient reports that he had a colonoscopy approximately 1 week ago with abdominal pain that began thereafter.  Upon evaluation in the emergency department on this presentation, CT imaging of the abdomen and pelvis reveals recurrent small bowel obstruction with suspected transition zone within the left pelvis.  Hospitalist group was contacted and patient was admitted to the hospital.  General surgery was consulted.  NG tube was placed and set to low intermittent suction while patient was kept NPO.   Assessment and Plan: * SBO (small bowel obstruction) (HCC) Small bowel obstruction protocol contrast study performed evening of 1/12 confirming partial small bowel obstruction Likely secondary to adhesions due to patient's numerous abdominal surgeries  I have reviewed the patient's case with Dr. Lorenso Courier with oncology on 1/12 who feels that patient's intra-abdominal lymphadenopathy is unlikely to be contributing to the patient's obstruction.   NG tube was accidentally dislodged overnight and had to be replaced  early in the morning on 1/13. Continuing to hydrate patient with intravenous isotonic fluids Monitoring electrolytes closely and replace as necessary General surgery following along, their input is appreciated. If patient continues to have significant abdominal pain and high NG tube output will review case again with oncology.  Hyponatremia Resolved  Hypokalemia Replaced   Chronic diastolic CHF (congestive heart failure) (HCC) No clinical evidence of cardiogenic volume overload Monitor for developing volume overload with continuous intravenous fluids Strict input and output monitoring Daily weights   Coronary artery disease involving native coronary artery of native heart without angina pectoris Patient is currently chest pain free As needed sublingual nitroglycerin for episodic chest pain Will resume outpatient regimen of aspirin and statin therapy once taking p.o. again    Essential hypertension Patient continues to be hypertensive off of his home oral antihypertensives. Will place patient on scheduled intravenous Vasotec. Provide patient with additional as needed intravenous angiotensins for markedly elevated blood pressure.  Mixed hyperlipidemia Holding statins until patient is able to tolerate oral intake again  Follicular lymphoma, unspecified, intra-abdominal lymph nodes ([place]) Follows with Dr. Lorenso Courier in the outpatient setting No current active treatment Case discussed with Dr. Lorenso Courier on admission, his input is appreciated. Patient to continue to follow-up as an outpatient at discharge.  OSA on CPAP Holding off on CPAP due to bowel obstruction supplemental oxygen to be provided nightly instead  Hypomagnesemia Replaced     Subjective:  Patient reports abdominal pain is slightly improved but still significant.  Patient describes the pain as moderate and sometimes severe in intensity, episodic and sharp in quality.    Physical Exam:  Vitals:   03/22/22  2015 03/23/22 0829 03/23/22 1510 03/23/22 2003  BP: (!) 163/88 (!) 166/77 (!) 153/72 Marland Kitchen)  156/70  Pulse: 65 (!) 57 (!) 59 61  Resp: '18 16 18 18  '$ Temp: 98.4 F (36.9 C) 97.6 F (36.4 C) 97.6 F (36.4 C) (!) 97.4 F (36.3 C)  TempSrc: Oral Oral Oral Oral  SpO2: 97% 95% 95% 94%  Weight:        Constitutional: Awake alert and oriented x3, patient is in distress due to abdominal pain. Skin: no rashes, no lesions, good skin turgor noted. Eyes: Pupils are equally reactive to light.  No evidence of scleral icterus or conjunctival pallor.  ENMT: NG tube in place.  Dry mucous membranes noted.  Posterior pharynx clear of any exudate or lesions.   Respiratory: clear to auscultation bilaterally, no wheezing, no crackles. Normal respiratory effort. No accessory muscle use.  Cardiovascular: Regular rate and rhythm, no murmurs / rubs / gallops. No extremity edema. 2+ pedal pulses. No carotid bruits.  Abdomen: Abdomen much less distended than yesterday.  Continued diffuse abdominal tenderness.  Abdomen is soft.  No evidence of intra-abdominal masses.  Positive bowel sounds noted in all quadrants.   Musculoskeletal: No joint deformity upper and lower extremities. Good ROM, no contractures. Normal muscle tone.    Data Reviewed:  I have personally reviewed and interpreted labs, imaging.  Significant findings are   CBC: Recent Labs  Lab 03/19/22 0905 03/20/22 2047 03/21/22 0342 03/22/22 0538 03/23/22 0421  WBC 12.3* 11.4* 9.5 10.0 8.1  NEUTROABS  --   --   --   --  5.9  HGB 13.5 13.2 12.2* 13.0 12.4*  HCT 40.8 40.3 37.1* 39.9 38.4*  MCV 96.9 97.6 98.7 99.8 99.0  PLT 249 277 227 234 097   Basic Metabolic Panel: Recent Labs  Lab 03/19/22 0905 03/20/22 2047 03/21/22 0342 03/22/22 0538 03/23/22 0421  NA 136 132* 134* 136 135  K 3.9 3.9 3.4* 3.9 4.0  CL 103 102 104 104 102  CO2 '24 24 25 23 26  '$ GLUCOSE 155* 117* 112* 102* 80  BUN 30* 30* 29* 25* 26*  CREATININE 1.10 1.21 1.04 0.92 1.02   CALCIUM 9.1 8.8* 8.3* 8.2* 8.2*  MG  --   --  1.5* 1.9 2.1   GFR: Estimated Creatinine Clearance: 59.8 mL/min (by C-G formula based on SCr of 1.02 mg/dL). Liver Function Tests: Recent Labs  Lab 03/19/22 0905 03/20/22 2047 03/21/22 0342 03/22/22 0538 03/23/22 0421  AST 14* 13* 18 12* 11*  ALT '12 10 12 12 10  '$ ALKPHOS 71 68 59 59 57  BILITOT 1.7* 1.8* 1.8* 2.1* 2.2*  PROT 7.3 7.0 5.8* 5.6* 5.3*  ALBUMIN 3.9 3.6 3.0* 3.1* 2.9*     Code Status:  Full code.  Code status decision has been confirmed with: patient Family Communication: deferred    Severity of Illness:  The appropriate patient status for this patient is INPATIENT. Inpatient status is judged to be reasonable and necessary in order to provide the required intensity of service to ensure the patient's safety. The patient's presenting symptoms, physical exam findings, and initial radiographic and laboratory data in the context of their chronic comorbidities is felt to place them at high risk for further clinical deterioration. Furthermore, it is not anticipated that the patient will be medically stable for discharge from the hospital within 2 midnights of admission.   * I certify that at the point of admission it is my clinical judgment that the patient will require inpatient hospital care spanning beyond 2 midnights from the point of admission due to high intensity  of service, high risk for further deterioration and high frequency of surveillance required.*  Time spent:  35 minutes  Author:  Vernelle Emerald MD  03/23/2022 11:36 PM

## 2022-03-23 NOTE — Progress Notes (Signed)
Patient ID: Jose Ware, male   DOB: 05-22-1934, 88 y.o.   MRN: [id number]   Acute Care Surgery Service Progress Note:    Chief Complaint/Subjective: A little  abd pain this am No complaints Large liquid BM this am 700 bilious NG output  Objective: Vital signs in last 24 hours: Temp:  [97.6 F (36.4 C)-98.4 F (36.9 C)] 97.6 F (36.4 C) (01/14 0829) Pulse Rate:  [57-65] 57 (01/14 0829) Resp:  [16-18] 16 (01/14 0829) BP: (162-166)/(77-88) 166/77 (01/14 0829) SpO2:  [95 %-97 %] 95 % (01/14 0829) Last BM Date : 03/22/22  Intake/Output from previous day: 01/13 0701 - 01/14 0700 In: 3170 [I.V.:3010; NG/GT:160] Out: 2110 [Urine:1410; Emesis/NG output:700] Intake/Output this shift: No intake/output data recorded.  Lungs: cta, nonlabored  Cardiovascular: reg  Abd: soft, multiple old incisions, very mild distension, not really TTP  Extremities: no edema, +SCDs  Neuro: alert, nonfocal  Lab Results: CBC  Recent Labs    03/22/22 0538 03/23/22 0421  WBC 10.0 8.1  HGB 13.0 12.4*  HCT 39.9 38.4*  PLT 234 232    BMET Recent Labs    03/22/22 0538 03/23/22 0421  NA 136 135  K 3.9 4.0  CL 104 102  CO2 23 26  GLUCOSE 102* 80  BUN 25* 26*  CREATININE 0.92 1.02  CALCIUM 8.2* 8.2*    LFT    Latest Ref Rng & Units 03/23/2022    4:21 AM 03/22/2022    5:38 AM 03/21/2022    3:42 AM  Hepatic Function  Total Protein 6.5 - 8.1 g/dL 5.3  5.6  5.8   Albumin 3.5 - 5.0 g/dL 2.9  3.1  3.0   AST 15 - 41 U/L '11  12  18   '$ ALT 0 - 44 U/L '10  12  12   '$ Alk Phosphatase 38 - 126 U/L 57  59  59   Total Bilirubin 0.3 - 1.2 mg/dL 2.2  2.1  1.8    PT/INR No results for input(s): "LABPROT", "INR" in the last 72 hours. ABG No results for input(s): "PHART", "HCO3" in the last 72 hours.  Invalid input(s): "PCO2", "PO2"  Studies/Results:  Anti-infectives: Anti-infectives (From admission, onward)    None       Medications: Scheduled Meds:  enalaprilat  1.25 mg  Intravenous Q6H   enoxaparin (LOVENOX) injection  40 mg Subcutaneous Q24H   pantoprazole (PROTONIX) IV  40 mg Intravenous Q12H   Continuous Infusions:  lactated ringers 1,000 mL with potassium chloride 20 mEq infusion 100 mL/hr at 03/23/22 0621   PRN Meds:.hydrALAZINE, morphine injection **OR** morphine injection, ondansetron **OR** ondansetron (ZOFRAN) IV, phenol  Assessment/Plan: Patient Active Problem List   Diagnosis Date Noted   Hypokalemia 03/21/2022   Chronic diastolic CHF (congestive heart failure) ([place]) 03/21/2022   Mixed hyperlipidemia 03/21/2022   Hypomagnesemia 03/21/2022   SBO (small bowel obstruction) ([place]) 03/20/2022   Hyponatremia 03/20/2022   Lymphoma ([place]) 03/20/2022   Benign neoplasm of transverse colon 03/13/2022   Benign neoplasm of descending colon 03/13/2022   Benign neoplasm of sigmoid colon 03/13/2022   Benign neoplasm of rectum 94/85/4627   Follicular lymphoma, unspecified, intra-abdominal lymph nodes ([place]) 04/11/2021   History of small bowel obstruction 11/13/2020   OSA on CPAP 05/08/2020   Pain of back and left lower extremity 06/08/2019   Left knee pain 06/06/2019   Onychomycosis 10/28/2017   Spondylosis 06/25/2017   Post herpetic neuralgia 06/20/2017   Spinal stenosis of lumbar region at multiple levels  04/30/2017   Drug reaction 02/05/2017   Senile purpura ([place]) 02/05/2017   Dyslipidemia 11/16/2015   History of ventricular tachycardia 11/08/2015   Coronary artery disease involving native coronary artery of native heart without angina pectoris    Ischemic cardiomyopathy    History of embolic stroke    Post-traumatic osteoarthritis of right wrist 04/25/2015   Hx of adenomatous colonic polyps 01/18/2015   History of primary hyperparathyroidism s/p parathyroidectomy (benign adenoma) 12/28/2013   History of stomach cancer 12/01/2013   Fatigue 12/01/2013   Lower back pain 03/24/2013   Bradycardia 09/23/2012   B12 deficiency 09/17/2010   History  of prostate cancer 12/14/2008   PERIPHERAL NEUROPATHY 10/18/2007   PSORIASIS 10/15/2007   Essential hypertension 11/27/2006   DEGENERATIVE JOINT DISEASE 11/27/2006   NEPHROLITHIASIS, HX OF 11/27/2006   Recurrent SBO - last SBO 11/2020, he reports 2-3 total - multiple prior abdominal surgeries (listed above) - CT 1/11 shows SBO with suspected transition zone within the pelvis on the left - No indication for acute surgical intervention - no fever, no tachy, no wbc, exam stable - cont SBO protocol, 8hr film reviewed - contrast still in SB and SB still distended. 24hr film this am - not read but appears to have some contrast in colon, good air in colon. He has had multiple prior abdominal surgeries and this could be adhesive in nature, We will follow.   ID - none VTE - lovenox FEN - IVF, NPO/NGT to LIWS Foley - none   Follicular lymphoma - follows with Dr. Lorenso Courier, looks like bone mets are new on this CT scan compared to previous 07/2021, he will need outpatient oncology follow up CAD/ hx MI 2017 s/p stent placement Hx embolic CVA secondary to PCI procedure HTN HLD Hx prostate cancer s/p XRT and radiation seed implantation 2010 OSA on CPAP   I reviewed hospitalist notes, last 24 h vitals and pain scores, last 48 h intake and output, last 24 h labs and trends, and last 24 h imaging results.  Disposition: given 700cc bilious output - cont NG tube today, appears to be slowly improving from sbo standpoint  LOS: 3 days    Leighton Ruff. Redmond Pulling, MD, FACS General, Bariatric, & Minimally Invasive Surgery [phone] [facility]

## 2022-03-24 DIAGNOSIS — E876 Hypokalemia: Secondary | ICD-10-CM | POA: Diagnosis not present

## 2022-03-24 DIAGNOSIS — E871 Hypo-osmolality and hyponatremia: Secondary | ICD-10-CM | POA: Diagnosis not present

## 2022-03-24 DIAGNOSIS — K56609 Unspecified intestinal obstruction, unspecified as to partial versus complete obstruction: Secondary | ICD-10-CM | POA: Diagnosis not present

## 2022-03-24 DIAGNOSIS — I5032 Chronic diastolic (congestive) heart failure: Secondary | ICD-10-CM | POA: Diagnosis not present

## 2022-03-24 LAB — CBC WITH DIFFERENTIAL/PLATELET
Abs Immature Granulocytes: 0.05 10*3/uL (ref 0.00–0.07)
Basophils Absolute: 0 10*3/uL (ref 0.0–0.1)
Basophils Relative: 0 %
Eosinophils Absolute: 0.1 10*3/uL (ref 0.0–0.5)
Eosinophils Relative: 1 %
HCT: 37.7 % — ABNORMAL LOW (ref 39.0–52.0)
Hemoglobin: 12.6 g/dL — ABNORMAL LOW (ref 13.0–17.0)
Immature Granulocytes: 1 %
Lymphocytes Relative: 9 %
Lymphs Abs: 0.8 10*3/uL (ref 0.7–4.0)
MCH: 33 pg (ref 26.0–34.0)
MCHC: 33.4 g/dL (ref 30.0–36.0)
MCV: 98.7 fL (ref 80.0–100.0)
Monocytes Absolute: 1.3 10*3/uL — ABNORMAL HIGH (ref 0.1–1.0)
Monocytes Relative: 15 %
Neutro Abs: 6.5 10*3/uL (ref 1.7–7.7)
Neutrophils Relative %: 74 %
Platelets: 226 10*3/uL (ref 150–400)
RBC: 3.82 MIL/uL — ABNORMAL LOW (ref 4.22–5.81)
RDW: 14 % (ref 11.5–15.5)
WBC: 8.7 10*3/uL (ref 4.0–10.5)
nRBC: 0 % (ref 0.0–0.2)

## 2022-03-24 LAB — COMPREHENSIVE METABOLIC PANEL
ALT: 9 U/L (ref 0–44)
AST: 11 U/L — ABNORMAL LOW (ref 15–41)
Albumin: 2.7 g/dL — ABNORMAL LOW (ref 3.5–5.0)
Alkaline Phosphatase: 55 U/L (ref 38–126)
Anion gap: 8 (ref 5–15)
BUN: 27 mg/dL — ABNORMAL HIGH (ref 8–23)
CO2: 27 mmol/L (ref 22–32)
Calcium: 8.1 mg/dL — ABNORMAL LOW (ref 8.9–10.3)
Chloride: 100 mmol/L (ref 98–111)
Creatinine, Ser: 1.12 mg/dL (ref 0.61–1.24)
GFR, Estimated: >60 mL/min (ref >60)
Glucose, Bld: 70 mg/dL (ref 70–99)
Potassium: 4.1 mmol/L (ref 3.5–5.1)
Sodium: 135 mmol/L (ref 135–145)
Total Bilirubin: 2 mg/dL — ABNORMAL HIGH (ref 0.3–1.2)
Total Protein: 5.1 g/dL — ABNORMAL LOW (ref 6.5–8.1)

## 2022-03-24 LAB — MAGNESIUM: Magnesium: 2.1 mg/dL (ref 1.7–2.4)

## 2022-03-24 MED ORDER — NAPHAZOLINE-PHENIRAMINE 0.025-0.3 % OP SOLN
2.0000 [drp] | Freq: Four times a day (QID) | OPHTHALMIC | Status: DC | PRN
  Administered 2022-03-24 – 2022-03-26 (×7): 2 [drp] via OPHTHALMIC
  Filled 2022-03-24: qty 15

## 2022-03-24 NOTE — Progress Notes (Signed)
PROGRESS NOTE   Jose Ware  OXB:[id number] DOB: Dec 25, 1934 DOA: 03/20/2022 PCP: [name], MD   Date of Service: the patient was seen and examined on 03/24/2022  Brief Narrative:  88 year old male with past medical history follicular lymphoma (follows with Dr. Lorenso Courier, not on treatment),  severe 3-vessel coronary artery disease (S/P STEMI 10/2015 with DES to LAD and RCA), ischemic cardiomyopathy (Echo 10/2015 45-50% with G1DD, repeat echo 07/2021 unable to get EF), prior embolic stroke after 6834 PCI, hypertension, hyperlipidemia, prostate cancer (S/P radiation and seed implantation 2010), obstructive sleep apnea on CPAP presenting to [facility] 1/11 with complaints of right lower quadrant abdominal pain.  Of note, patient has a history of multiple abdominal surgeries including colostomy and reversal.  Patient additionally has a history of multiple small bowel obstructions.  Patient reports that he had a colonoscopy approximately 1 week ago with abdominal pain that began thereafter.  Upon evaluation in the emergency department on this presentation, CT imaging of the abdomen and pelvis reveals recurrent small bowel obstruction with suspected transition zone within the left pelvis.  Hospitalist group was contacted and patient was admitted to the hospital.  General surgery was consulted.  NG tube was placed and set to low intermittent suction while patient was kept NPO.   Assessment and Plan: * SBO (small bowel obstruction) (HCC) Small bowel obstruction protocol contrast study performed evening of 1/12 confirming partial small bowel obstruction Likely secondary to adhesions due to patient's numerous abdominal surgeries  I have reviewed the patient's case with Dr. Lorenso Courier with oncology on 1/12 who feels that patient's intra-abdominal lymphadenopathy is unlikely to be contributing to the patient's obstruction.   General surgery following daily, their assistance is  appreciated. Per general surgery patient to be initiated on trial of clear liquids today and if tolerated NG tube will be removed. Monitoring electrolytes closely and replace as necessary As needed opiate based analgesics for associated pain.  Hyponatremia Resolved  Hypokalemia Replaced   Chronic diastolic CHF (congestive heart failure) (HCC) No clinical evidence of cardiogenic volume overload Strict input and output monitoring Daily weights   Coronary artery disease involving native coronary artery of native heart without angina pectoris Patient is currently chest pain free As needed sublingual nitroglycerin for episodic chest pain Will resume outpatient regimen of aspirin and statin therapy once consistently taking p.o. again    Essential hypertension Patient continues to be hypertensive off of his home oral antihypertensives. Continuing intravenous Vasotec. Transition back to oral regimen once consistently eating solid food.  Provide patient with additional as needed intravenous antihypertensives for markedly elevated blood pressure.  Mixed hyperlipidemia Holding statins until patient is able to consistently right solid oral intake again  Follicular lymphoma, unspecified, intra-abdominal lymph nodes ([place]) Follows with Dr. Lorenso Courier in the outpatient setting No current active treatment Case discussed with Dr. Lorenso Courier on admission, his input is appreciated. Patient to continue to follow-up as an outpatient at discharge.  OSA on CPAP Holding off on CPAP due to bowel obstruction supplemental oxygen to be provided nightly instead  Hypomagnesemia Replaced      Subjective:  Patient still complaining of some degree of abdominal pain, mild to moderate in intensity not improved since yesterday.  Pain is sharp in quality, worse with movement improved with rest.  Patient denies any nausea or vomiting.  Patient has had 1 loose stool today.  Physical Exam:  Vitals:    03/23/22 2003 03/24/22 0046 03/24/22 0603 03/24/22 1312  BP: (!) 156/70 Marland Kitchen)  155/87 (!) 171/69 (!) 154/73  Pulse: 61 62 62 60  Resp: '18 18 20 18  '$ Temp: (!) 97.4 F (36.3 C) 98.3 F (36.8 C) 98.4 F (36.9 C) 97.9 F (36.6 C)  TempSrc: Oral Oral Oral Oral  SpO2: 94% 94% 93% 97%  Weight:        Constitutional: Awake alert and oriented x3, no associated distress.   Skin: no rashes, no lesions, good skin turgor noted. Eyes: Pupils are equally reactive to light.  No evidence of scleral icterus or conjunctival pallor.  ENMT: Moist mucous membranes noted.  Posterior pharynx clear of any exudate or lesions.   Respiratory: clear to auscultation bilaterally, no wheezing, no crackles. Normal respiratory effort. No accessory muscle use.  Cardiovascular: Regular rate and rhythm, no murmurs / rubs / gallops. No extremity edema. 2+ pedal pulses. No carotid bruits.  Abdomen: Mild diffuse tenderness.  Abdomen is soft.  Normal bowel sounds heard in all quadrants..  No evidence of intra-abdominal masses.  Positive bowel sounds noted in all quadrants.   Musculoskeletal: No joint deformity upper and lower extremities. Good ROM, no contractures. Normal muscle tone.    Data Reviewed:  I have personally reviewed and interpreted labs, imaging.  Significant findings are   CBC: Recent Labs  Lab 03/20/22 2047 03/21/22 0342 03/22/22 0538 03/23/22 0421 03/24/22 0404  WBC 11.4* 9.5 10.0 8.1 8.7  NEUTROABS  --   --   --  5.9 6.5  HGB 13.2 12.2* 13.0 12.4* 12.6*  HCT 40.3 37.1* 39.9 38.4* 37.7*  MCV 97.6 98.7 99.8 99.0 98.7  PLT 277 227 234 232 027   Basic Metabolic Panel: Recent Labs  Lab 03/20/22 2047 03/21/22 0342 03/22/22 0538 03/23/22 0421 03/24/22 0404  NA 132* 134* 136 135 135  K 3.9 3.4* 3.9 4.0 4.1  CL 102 104 104 102 100  CO2 '24 25 23 26 27  '$ GLUCOSE 117* 112* 102* 80 70  BUN 30* 29* 25* 26* 27*  CREATININE 1.21 1.04 0.92 1.02 1.12  CALCIUM 8.8* 8.3* 8.2* 8.2* 8.1*  MG  --  1.5*  1.9 2.1 2.1   GFR: Estimated Creatinine Clearance: 54.5 mL/min (by C-G formula based on SCr of 1.12 mg/dL). Liver Function Tests: Recent Labs  Lab 03/20/22 2047 03/21/22 0342 03/22/22 0538 03/23/22 0421 03/24/22 0404  AST 13* 18 12* 11* 11*  ALT '10 12 12 10 9  '$ ALKPHOS 68 59 59 57 55  BILITOT 1.8* 1.8* 2.1* 2.2* 2.0*  PROT 7.0 5.8* 5.6* 5.3* 5.1*  ALBUMIN 3.6 3.0* 3.1* 2.9* 2.7*      Code Status:  Full code.  Code status decision has been confirmed with: patient Family Communication: Wife is at bedside and has been updated on plan of care (1/15)   Severity of Illness:  The appropriate patient status for this patient is INPATIENT. Inpatient status is judged to be reasonable and necessary in order to provide the required intensity of service to ensure the patient's safety. The patient's presenting symptoms, physical exam findings, and initial radiographic and laboratory data in the context of their chronic comorbidities is felt to place them at high risk for further clinical deterioration. Furthermore, it is not anticipated that the patient will be medically stable for discharge from the hospital within 2 midnights of admission.   * I certify that at the point of admission it is my clinical judgment that the patient will require inpatient hospital care spanning beyond 2 midnights from the point of admission due to  high intensity of service, high risk for further deterioration and high frequency of surveillance required.*  Time spent:  35 minutes  Author:  Vernelle Emerald MD  03/24/2022 9:57 PM

## 2022-03-24 NOTE — Progress Notes (Signed)
Progress Note     Subjective: Pt reports he had another BM yesterday and passing more flatus. Abdomen feels less bloated. Does still have some abdominal pain but less and no longer in RLQ. Pain this AM is more in L flank/back.   Objective: Vital signs in last 24 hours: Temp:  [97.4 F (36.3 C)-98.4 F (36.9 C)] 98.4 F (36.9 C) (01/15 0603) Pulse Rate:  [59-62] 62 (01/15 0603) Resp:  [18-20] 20 (01/15 0603) BP: (153-171)/(69-87) 171/69 (01/15 0603) SpO2:  [93 %-95 %] 93 % (01/15 0603) Last BM Date : 03/23/22  Intake/Output from previous day: 01/14 0701 - 01/15 0700 In: 2680 [I.V.:2680] Out: 2350 [Urine:1350; Emesis/NG output:1000] Intake/Output this shift: No intake/output data recorded.  PE: General: pleasant, WD, WN male who is laying in bed in NAD Heart: regular, rate, and rhythm.   Lungs:  Respiratory effort nonlabored Abd: soft, NT, ND, +BS, NGT with bilious drainage, multiple well healed surgical scars Psych: A&Ox3 with an appropriate affect.    Lab Results:  Recent Labs    03/23/22 0421 03/24/22 0404  WBC 8.1 8.7  HGB 12.4* 12.6*  HCT 38.4* 37.7*  PLT 232 226   BMET Recent Labs    03/23/22 0421 03/24/22 0404  NA 135 135  K 4.0 4.1  CL 102 100  CO2 26 27  GLUCOSE 80 70  BUN 26* 27*  CREATININE 1.02 1.12  CALCIUM 8.2* 8.1*   PT/INR No results for input(s): "LABPROT", "INR" in the last 72 hours. CMP     Component Value Date/Time   NA 135 03/24/2022 0404   NA 140 05/06/2018 1103   K 4.1 03/24/2022 0404   CL 100 03/24/2022 0404   CO2 27 03/24/2022 0404   GLUCOSE 70 03/24/2022 0404   BUN 27 (H) 03/24/2022 0404   BUN 27 05/06/2018 1103   CREATININE 1.12 03/24/2022 0404   CREATININE 1.10 12/23/2019 1204   CALCIUM 8.1 (L) 03/24/2022 0404   PROT 5.1 (L) 03/24/2022 0404   PROT 6.1 05/06/2018 1103   ALBUMIN 2.7 (L) 03/24/2022 0404   ALBUMIN 3.9 05/06/2018 1103   AST 11 (L) 03/24/2022 0404   ALT 9 03/24/2022 0404   ALKPHOS 55 03/24/2022  0404   BILITOT 2.0 (H) 03/24/2022 0404   BILITOT 1.2 05/06/2018 1103   GFRNONAA >60 03/24/2022 0404   GFRNONAA 62 12/23/2019 1204   GFRAA 72 12/23/2019 1204   Lipase     Component Value Date/Time   LIPASE 30 03/20/2022 2047       Studies/Results: DG Abd Portable 1V-Small Bowel Obstruction Protocol-24 hr delay  Result Date: 03/23/2022 CLINICAL DATA:  Evaluate transit of contrast. EXAM: PORTABLE ABDOMEN - 1 VIEW COMPARISON:  One-view abdomen 03/22/2022 at 4:28 p.m. and one-view abdomen 03/22/2022 at 3:50 a.m. FINDINGS: Contrast previously seen predominantly within the distal small bowel and ascending colon is now distributed more diffusely throughout the colon. Some contrast is again noted within the rectum. A focal area of contrast in the right upper abdomen within small bowel is less prominent than on the prior exam. This likely represents contrast within a duodenal diverticulum. The NG tube is coiled in the stomach. IMPRESSION: Contrast previously seen predominantly within the distal small bowel and ascending colon is now distributed more diffusely throughout the colon. Electronically Signed   By: San Morelle M.D.   On: 03/23/2022 10:36   DG Abd Portable 1V-Small Bowel Obstruction Protocol-24 hr delay  Result Date: 03/22/2022 CLINICAL DATA:  NG tube placement EXAM:  PORTABLE ABDOMEN - 1 VIEW COMPARISON:  03/22/2022 FINDINGS: NG tube in the fundus of the stomach. IMPRESSION: NG tube in the fundus of the stomach. Electronically Signed   By: Rolm Baptise M.D.   On: 03/22/2022 17:12    Anti-infectives: Anti-infectives (From admission, onward)    None        Assessment/Plan  Recurrent SBO - last SBO 11/2020, he reports 2-3 total - multiple prior abdominal surgeries  - CT 1/11 shows SBO with suspected transition zone within the pelvis on the left - 24 h delay film shows contrast progression through colon, NGT still with fairly high output but clinically patient looks like he  is progressing - clamp NGT and allow CLD, if tolerating this for 6h then remove NGT this afternoon - continue to mobilize - recommend keeping K>4.0 and Mg >2.0 to optimize bowel function  - No indication for acute surgical intervention.    ID - none VTE - lovenox FEN - IVF, CLD, NGT clamped  Foley - none   Follicular lymphoma - follows with Dr. 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    Norm Parcel, [facility] Surgery 03/24/2022, 9:29 AM Please see Amion for pager number during day hours 7:00am-4:30pm

## 2022-03-24 NOTE — Progress Notes (Signed)
  Transition of Care [facility]) Screening Note   Patient Details  Name: Jose Ware Date of Birth: [birth year]   Transition of Care [facility]) CM/SW Contact:    Vassie Moselle, LCSW Phone Number: 03/24/2022, 3:44 PM    Transition of Care Department [facility]) has reviewed patient and no TOC needs have been identified at this time. We will continue to monitor patient advancement through interdisciplinary progression rounds. If new patient transition needs arise, please place a TOC consult.

## 2022-03-24 NOTE — Plan of Care (Signed)

## 2022-03-24 NOTE — Care Management Important Message (Signed)
Important Message  Patient Details IM Letter given. Name: Jose Ware MRN: [id number] Date of Birth: 17-Jun-1934   Medicare Important Message Given:  Yes     Kerin Salen 03/24/2022, 10:52 AM

## 2022-03-25 DIAGNOSIS — I5032 Chronic diastolic (congestive) heart failure: Secondary | ICD-10-CM | POA: Diagnosis not present

## 2022-03-25 DIAGNOSIS — E876 Hypokalemia: Secondary | ICD-10-CM | POA: Diagnosis not present

## 2022-03-25 DIAGNOSIS — K56609 Unspecified intestinal obstruction, unspecified as to partial versus complete obstruction: Secondary | ICD-10-CM | POA: Diagnosis not present

## 2022-03-25 DIAGNOSIS — E871 Hypo-osmolality and hyponatremia: Secondary | ICD-10-CM | POA: Diagnosis not present

## 2022-03-25 LAB — CBC WITH DIFFERENTIAL/PLATELET
Abs Immature Granulocytes: 0.05 10*3/uL (ref 0.00–0.07)
Basophils Absolute: 0 10*3/uL (ref 0.0–0.1)
Basophils Relative: 0 %
Eosinophils Absolute: 0.1 10*3/uL (ref 0.0–0.5)
Eosinophils Relative: 1 %
HCT: 34.9 % — ABNORMAL LOW (ref 39.0–52.0)
Hemoglobin: 11.5 g/dL — ABNORMAL LOW (ref 13.0–17.0)
Immature Granulocytes: 1 %
Lymphocytes Relative: 10 %
Lymphs Abs: 0.8 10*3/uL (ref 0.7–4.0)
MCH: 32.1 pg (ref 26.0–34.0)
MCHC: 33 g/dL (ref 30.0–36.0)
MCV: 97.5 fL (ref 80.0–100.0)
Monocytes Absolute: 1.4 10*3/uL — ABNORMAL HIGH (ref 0.1–1.0)
Monocytes Relative: 17 %
Neutro Abs: 6 10*3/uL (ref 1.7–7.7)
Neutrophils Relative %: 71 %
Platelets: 212 10*3/uL (ref 150–400)
RBC: 3.58 MIL/uL — ABNORMAL LOW (ref 4.22–5.81)
RDW: 13.9 % (ref 11.5–15.5)
WBC: 8.3 10*3/uL (ref 4.0–10.5)
nRBC: 0 % (ref 0.0–0.2)

## 2022-03-25 LAB — COMPREHENSIVE METABOLIC PANEL
ALT: 9 U/L (ref 0–44)
AST: 12 U/L — ABNORMAL LOW (ref 15–41)
Albumin: 2.8 g/dL — ABNORMAL LOW (ref 3.5–5.0)
Alkaline Phosphatase: 52 U/L (ref 38–126)
Anion gap: 8 (ref 5–15)
BUN: 21 mg/dL (ref 8–23)
CO2: 28 mmol/L (ref 22–32)
Calcium: 8 mg/dL — ABNORMAL LOW (ref 8.9–10.3)
Chloride: 97 mmol/L — ABNORMAL LOW (ref 98–111)
Creatinine, Ser: 1.07 mg/dL (ref 0.61–1.24)
GFR, Estimated: >60 mL/min (ref >60)
Glucose, Bld: 101 mg/dL — ABNORMAL HIGH (ref 70–99)
Potassium: 4 mmol/L (ref 3.5–5.1)
Sodium: 133 mmol/L — ABNORMAL LOW (ref 135–145)
Total Bilirubin: 1.6 mg/dL — ABNORMAL HIGH (ref 0.3–1.2)
Total Protein: 5.3 g/dL — ABNORMAL LOW (ref 6.5–8.1)

## 2022-03-25 LAB — MAGNESIUM: Magnesium: 1.8 mg/dL (ref 1.7–2.4)

## 2022-03-25 NOTE — Progress Notes (Signed)
Mobility Specialist - Progress Note   03/25/22 1248  Mobility  Activity Ambulated with assistance in hallway  Level of Assistance Contact guard assist, steadying assist  Assistive Device Front wheel walker  Distance Ambulated (ft) 350 ft  Activity Response Tolerated well  Mobility Referral Yes  $Mobility charge 1 Mobility   Pt received in bed and agreed to mobility. Some pain when sitting EOB in lower back. Pt back to chair with all needs met.   Roderick Pee Mobility Specialist

## 2022-03-25 NOTE — Progress Notes (Signed)
Progress Note     Subjective: Has had more bowel movements and abdominal pain resolved. No nausea/vomiting on current diet.   Objective: Vital signs in last 24 hours: Temp:  [97.9 F (36.6 C)] 97.9 F (36.6 C) (01/15 2030) Pulse Rate:  [60] 60 (01/15 1312) Resp:  [18] 18 (01/15 2030) BP: (136-154)/(69-74) 153/72 (01/16 0620) SpO2:  [95 %-97 %] 95 % (01/15 2030) Last BM Date : 03/24/22  Intake/Output from previous day: 01/15 0701 - 01/16 0700 In: 1779.9 [P.O.:720; I.V.:1059.9] Out: 1000 [Urine:900; Emesis/NG output:100] Intake/Output this shift: No intake/output data recorded.  PE: General: pleasant, WD, WN male who is laying in bed in NAD Heart: regular, rate, and rhythm.   Lungs:  Respiratory effort nonlabored Abd: soft, NT, ND, +BS,  multiple well healed surgical scars Psych: A&Ox3 with an appropriate affect.    Lab Results:  Recent Labs    03/24/22 0404 03/25/22 0401  WBC 8.7 8.3  HGB 12.6* 11.5*  HCT 37.7* 34.9*  PLT 226 212    BMET Recent Labs    03/24/22 0404 03/25/22 0401  NA 135 133*  K 4.1 4.0  CL 100 97*  CO2 27 28  GLUCOSE 70 101*  BUN 27* 21  CREATININE 1.12 1.07  CALCIUM 8.1* 8.0*    PT/INR No results for input(s): "LABPROT", "INR" in the last 72 hours. CMP     Component Value Date/Time   NA 133 (L) 03/25/2022 0401   NA 140 05/06/2018 1103   K 4.0 03/25/2022 0401   CL 97 (L) 03/25/2022 0401   CO2 28 03/25/2022 0401   GLUCOSE 101 (H) 03/25/2022 0401   BUN 21 03/25/2022 0401   BUN 27 05/06/2018 1103   CREATININE 1.07 03/25/2022 0401   CREATININE 1.10 12/23/2019 1204   CALCIUM 8.0 (L) 03/25/2022 0401   PROT 5.3 (L) 03/25/2022 0401   PROT 6.1 05/06/2018 1103   ALBUMIN 2.8 (L) 03/25/2022 0401   ALBUMIN 3.9 05/06/2018 1103   AST 12 (L) 03/25/2022 0401   ALT 9 03/25/2022 0401   ALKPHOS 52 03/25/2022 0401   BILITOT 1.6 (H) 03/25/2022 0401   BILITOT 1.2 05/06/2018 1103   GFRNONAA >60 03/25/2022 0401   GFRNONAA 62 12/23/2019  1204   GFRAA 72 12/23/2019 1204   Lipase     Component Value Date/Time   LIPASE 30 03/20/2022 2047       Studies/Results: No results found.  Anti-infectives: Anti-infectives (From admission, onward)    None        Assessment/Plan  Recurrent SBO - last SBO 11/2020, he reports 2-3 total - multiple prior abdominal surgeries  - CT 1/11 shows SBO with suspected transition zone within the pelvis on the left - 24 h delay film shows contrast progression through colon - NGT out yesterday, tolerated CLD and having bowel function - continue to mobilize - recommend keeping K>4.0 and Mg >2.0 to optimize bowel function  - No indication for acute surgical intervention.   Advance to soft diet and okay to discharge of soft diet once tolerating   ID - none VTE - lovenox FEN - IVF, soft Foley - none   Follicular lymphoma - follows with Dr. 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    Winferd Humphrey, [facility] Surgery 03/25/2022, 9:56 AM Please see Amion for pager number during day hours 7:00am-4:30pm

## 2022-03-25 NOTE — TOC Progression Note (Signed)
Transition of Care [facility]) - Progression Note    Patient Details  Name: Jose Ware MRN: [id number] Date of Birth: March 26, 1934  Transition of Care [facility]) CM/SW [place], LCSW Phone Number: 03/25/2022, 4:07 PM  Clinical Narrative:    Spoke with pt who shares that he is interested in getting a RW at discharge to use at home. MD notified. Will order RW prior to pt being discharged.         Expected Discharge Plan and Services                                               Social Determinants of Health (SDOH) Interventions SDOH Screenings   Food Insecurity: No Food Insecurity (03/20/2022)  Housing: Low Risk  (03/20/2022)  Transportation Needs: No Transportation Needs (03/20/2022)  Utilities: Not At Risk (03/20/2022)  Depression (PHQ2-9): Low Risk  (05/17/2021)  Financial Resource Strain: Low Risk  (05/17/2021)  Physical Activity: Sufficiently Active (05/17/2021)  Social Connections: Socially Integrated (05/17/2021)  Stress: No Stress Concern Present (05/17/2021)  Tobacco Use: Medium Risk (03/20/2022)    Readmission Risk Interventions    03/25/2022    4:07 PM 03/24/2022    3:44 PM  Readmission Risk Prevention Plan  Transportation Screening Complete Complete  PCP or Specialist Appt within 5-7 Days  Complete  Home Care Screening  Complete  Medication Review (RN CM)  Complete

## 2022-03-25 NOTE — Plan of Care (Signed)

## 2022-03-26 DIAGNOSIS — Z8546 Personal history of malignant neoplasm of prostate: Secondary | ICD-10-CM

## 2022-03-26 DIAGNOSIS — E785 Hyperlipidemia, unspecified: Secondary | ICD-10-CM | POA: Diagnosis not present

## 2022-03-26 DIAGNOSIS — E871 Hypo-osmolality and hyponatremia: Secondary | ICD-10-CM | POA: Diagnosis not present

## 2022-03-26 DIAGNOSIS — K56609 Unspecified intestinal obstruction, unspecified as to partial versus complete obstruction: Secondary | ICD-10-CM | POA: Diagnosis not present

## 2022-03-26 DIAGNOSIS — I10 Essential (primary) hypertension: Secondary | ICD-10-CM | POA: Diagnosis not present

## 2022-03-26 MED ORDER — NITROGLYCERIN 0.4 MG SL SUBL: 0.4000 mg | SUBLINGUAL_TABLET | SUBLINGUAL | Status: DC | PRN

## 2022-03-26 MED ORDER — CARVEDILOL 3.125 MG PO TABS
3.1250 mg | ORAL_TABLET | Freq: Two times a day (BID) | ORAL | Status: DC
  Administered 2022-03-26 – 2022-03-29 (×8): 3.125 mg via ORAL
  Filled 2022-03-26 (×9): qty 1

## 2022-03-26 MED ORDER — AMLODIPINE BESYLATE 5 MG PO TABS
5.0000 mg | ORAL_TABLET | Freq: Every day | ORAL | Status: DC
  Administered 2022-03-26 – 2022-03-29 (×4): 5 mg via ORAL
  Filled 2022-03-26 (×4): qty 1

## 2022-03-26 MED ORDER — SENNOSIDES-DOCUSATE SODIUM 8.6-50 MG PO TABS
1.0000 | ORAL_TABLET | Freq: Two times a day (BID) | ORAL | Status: DC
  Administered 2022-03-26 – 2022-03-29 (×7): 1 via ORAL
  Filled 2022-03-26 (×7): qty 1

## 2022-03-26 MED ORDER — POLYETHYLENE GLYCOL 3350 17 G PO PACK: 17.0000 g | PACK | Freq: Every day | ORAL | Status: DC | PRN

## 2022-03-26 MED ORDER — POLYETHYLENE GLYCOL 3350 17 G PO PACK
17.0000 g | PACK | Freq: Every day | ORAL | Status: DC
  Administered 2022-03-26 – 2022-03-29 (×4): 17 g via ORAL
  Filled 2022-03-26 (×4): qty 1

## 2022-03-26 MED ORDER — LOSARTAN POTASSIUM 50 MG PO TABS
100.0000 mg | ORAL_TABLET | Freq: Every day | ORAL | Status: DC
  Administered 2022-03-26 – 2022-03-29 (×4): 100 mg via ORAL
  Filled 2022-03-26 (×5): qty 2

## 2022-03-26 MED ORDER — TRAMADOL HCL 50 MG PO TABS
50.0000 mg | ORAL_TABLET | Freq: Four times a day (QID) | ORAL | Status: DC | PRN
  Administered 2022-03-27 – 2022-03-29 (×2): 50 mg via ORAL
  Filled 2022-03-26 (×2): qty 1

## 2022-03-26 MED ORDER — SODIUM CHLORIDE 0.9 % IV SOLN: INTRAVENOUS | Status: DC

## 2022-03-26 MED ORDER — ATORVASTATIN CALCIUM 10 MG PO TABS
10.0000 mg | ORAL_TABLET | ORAL | Status: DC
  Administered 2022-03-26 – 2022-03-28 (×2): 10 mg via ORAL
  Filled 2022-03-26 (×2): qty 1

## 2022-03-26 MED ORDER — ASPIRIN 81 MG PO TBEC
81.0000 mg | DELAYED_RELEASE_TABLET | Freq: Every day | ORAL | Status: DC
  Administered 2022-03-26 – 2022-03-29 (×4): 81 mg via ORAL
  Filled 2022-03-26 (×4): qty 1

## 2022-03-26 NOTE — Progress Notes (Signed)
Progress Note     Subjective: Had worsening abdominal pain and nausea overnight/early this am requiring morphine and zofran. No vomiting. Pain and nausea improved this am and he has eaten some breakfast. He thinks he may have eaten too much at lunch and was unable to eat dinner. He had 2 bowel movements yesterday but has not had BM or passed flatus since then despite trying. He has been ambulating in the hallway  Additionally he notes episode of penile pain that began with urge to urinate and resolved after voiding. Encouraged patient to discuss further with hospitalist  Objective: Vital signs in last 24 hours: Temp:  [97.9 F (36.6 C)-98.3 F (36.8 C)] 98.3 F (36.8 C) (01/16 1948) Pulse Rate:  [60-69] 69 (01/17 0147) Resp:  [18-24] 18 (01/17 0400) BP: (143-163)/(67-83) 158/83 (01/17 0147) SpO2:  [97 %-98 %] 97 % (01/16 1948) Last BM Date : 03/25/22  Intake/Output from previous day: 01/16 0701 - 01/17 0700 In: -  Out: 600 [Urine:600] Intake/Output this shift: No intake/output data recorded.  PE: General: pleasant, WD, WN male who is laying in bed in NAD Heart: regular, rate, and rhythm.   Lungs:  Respiratory effort nonlabored Abd: soft, NT, mildly distended, +BS,  multiple well healed surgical scars Psych: A&Ox3 with an appropriate affect.    Lab Results:  Recent Labs    03/24/22 0404 03/25/22 0401  WBC 8.7 8.3  HGB 12.6* 11.5*  HCT 37.7* 34.9*  PLT 226 212    BMET Recent Labs    03/24/22 0404 03/25/22 0401  NA 135 133*  K 4.1 4.0  CL 100 97*  CO2 27 28  GLUCOSE 70 101*  BUN 27* 21  CREATININE 1.12 1.07  CALCIUM 8.1* 8.0*    PT/INR No results for input(s): "LABPROT", "INR" in the last 72 hours. CMP     Component Value Date/Time   NA 133 (L) 03/25/2022 0401   NA 140 05/06/2018 1103   K 4.0 03/25/2022 0401   CL 97 (L) 03/25/2022 0401   CO2 28 03/25/2022 0401   GLUCOSE 101 (H) 03/25/2022 0401   BUN 21 03/25/2022 0401   BUN 27 05/06/2018 1103    CREATININE 1.07 03/25/2022 0401   CREATININE 1.10 12/23/2019 1204   CALCIUM 8.0 (L) 03/25/2022 0401   PROT 5.3 (L) 03/25/2022 0401   PROT 6.1 05/06/2018 1103   ALBUMIN 2.8 (L) 03/25/2022 0401   ALBUMIN 3.9 05/06/2018 1103   AST 12 (L) 03/25/2022 0401   ALT 9 03/25/2022 0401   ALKPHOS 52 03/25/2022 0401   BILITOT 1.6 (H) 03/25/2022 0401   BILITOT 1.2 05/06/2018 1103   GFRNONAA >60 03/25/2022 0401   GFRNONAA 62 12/23/2019 1204   GFRAA 72 12/23/2019 1204   Lipase     Component Value Date/Time   LIPASE 30 03/20/2022 2047       Studies/Results: No results found.  Anti-infectives: Anti-infectives (From admission, onward)    None        Assessment/Plan  Recurrent SBO - last SBO 11/2020, he reports 2-3 total - multiple prior abdominal surgeries  - CT 1/11 shows SBO with suspected transition zone within the pelvis on the left - 24 h delay film shows contrast progression through colon - NGT out 1/15, has had multiple bowel movements - soft diet yesterday with increased abdominal pian and nausea overnight. Added tramadol for pain control. Added miralax qd and senokot bid - continue to mobilize - recommend keeping K>4.0 and Mg >2.0 to optimize bowel  function  - No indication for acute surgical intervention.   If he has good bowel function today and no further abdominal pain or nausea okay with discharge as early as this pm on soft diet. However if ongoing abdominal pain and not having reliable bowel function recommend further observation in hospital   ID - none VTE - lovenox FEN - IVF, soft Foley - none   Follicular lymphoma - follows with Dr. 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    Winferd Humphrey, [facility] Surgery 03/26/2022, 8:12 AM Please see Amion for pager number during day hours 7:00am-4:30pm

## 2022-03-26 NOTE — Progress Notes (Signed)
PROGRESS NOTE   Jose Ware  WUJ:[id number] DOB: Aug 13, 1934 DOA: 03/20/2022 PCP: [name], MD   Date of Service: the patient was seen and examined on 03/26/2022  Brief Narrative:  88 year old male with past medical history follicular lymphoma (follows with Dr. Lorenso Courier, not on treatment),  severe 3-vessel coronary artery disease (S/P STEMI 10/2015 with DES to LAD and RCA), ischemic cardiomyopathy (Echo 10/2015 45-50% with G1DD, repeat echo 07/2021 unable to get EF), prior embolic stroke after 9562 PCI, hypertension, hyperlipidemia, prostate cancer (S/P radiation and seed implantation 2010), obstructive sleep apnea on CPAP presenting [facility] 1/11 with complaints of right lower quadrant abdominal pain.  Of note, patient has a history of multiple abdominal surgeries including colostomy and reversal.  Patient additionally has a history of multiple small bowel obstructions.  Patient reports that he had a colonoscopy approximately 1 week ago with abdominal pain that began thereafter.  Upon evaluation in the emergency department on this presentation, CT imaging of the abdomen and pelvis revealed recurrent small bowel obstruction with suspected transition zone within the left pelvis.  Hospitalist group was contacted and patient was admitted to the hospital.  General surgery was consulted.  NG tube was placed and set to low intermittent suction while patient was kept NPO.  Obstruction was evaluated by a Gastrografin study with small bowel protocol.  In the days that followed patient's abdominal pain, abdominal distention and nausea gradually improved.  Eventually, NG tube was removed and patient was able to tolerate a clear liquid diet which was advanced as tolerated.   Assessment and Plan: * SBO (small bowel obstruction) (HCC) Small bowel obstruction protocol contrast study performed evening of 1/12 confirmed partial small bowel obstruction with 24-hour delay film revealing  contrast progression throughout the colon. Obstruction likely secondary to adhesions due to patient's numerous abdominal surgeries  I reviewed the patient's case with Dr. Lorenso Courier with oncology on 1/12 who felt that patient's intra-abdominal lymphadenopathy is unlikely to be contributing to the patient's obstruction.   General surgery following daily, their assistance is appreciated. Per general surgery patient's diet can be advanced today to a soft diet If tolerating discharge today versus tomorrow. Monitoring electrolytes closely and replace as necessary As needed opiate based analgesics for associated pain.  Hyponatremia Resolved  Hypokalemia Replaced   Chronic diastolic CHF (congestive heart failure) (HCC) No clinical evidence of cardiogenic volume overload Strict input and output monitoring Daily weights   Coronary artery disease involving native coronary artery of native heart without angina pectoris Patient is currently chest pain free As needed sublingual nitroglycerin for episodic chest pain Resuming aspirin, beta-blocker and statin therapy    Essential hypertension Resuming home antihypertensive regimen As needed intravenous antihypertensives for markedly elevated blood pressure.  Mixed hyperlipidemia Resuming home regimen of statin therapy  Follicular lymphoma, unspecified, intra-abdominal lymph nodes ([place]) Follows with Dr. Lorenso Courier in the outpatient setting No current active treatment Case discussed with Dr. Lorenso Courier on admission, his input is appreciated. Patient to continue to follow-up as an outpatient at discharge.  OSA on CPAP Holding off on CPAP due to bowel obstruction supplemental oxygen to be provided nightly instead  Hypomagnesemia Replaced      Subjective:  Patient complains of mild occasional cramping abdominal pain located in the lower abdomen.  Episodes are intermittent and resolve quickly.  Physical Exam:  Vitals:   03/25/22 0620  03/25/22 0936 03/25/22 1256 03/25/22 1948  BP: (!) 153/72  (!) 143/67 (!) 163/76  Pulse:   60  68  Resp:   (!) 24 20  Temp:   97.9 F (36.6 C) 98.3 F (36.8 C)  TempSrc:   Oral   SpO2:   98% 97%  Weight:      Height:  '6\' 1"'$  (1.854 m)     Constitutional: Awake alert and oriented x3, no associated distress.   Skin: no rashes, no lesions, good skin turgor noted. Eyes: Pupils are equally reactive to light.  No evidence of scleral icterus or conjunctival pallor.  ENMT: Moist mucous membranes noted.  Posterior pharynx clear of any exudate or lesions.   Respiratory: clear to auscultation bilaterally, no wheezing, no crackles. Normal respiratory effort. No accessory muscle use.  Cardiovascular: Regular rate and rhythm, no murmurs / rubs / gallops. No extremity edema. 2+ pedal pulses. No carotid bruits.  Abdomen: Abdomen is soft and nontender.  No evidence of intra-abdominal masses.  Positive bowel sounds noted in all quadrants.   Musculoskeletal: No joint deformity upper and lower extremities. Good ROM, no contractures. Normal muscle tone.    Data Reviewed:  I have personally reviewed and interpreted labs, imaging.  Significant findings are   CBC: Recent Labs  Lab 03/21/22 0342 03/22/22 0538 03/23/22 0421 03/24/22 0404 03/25/22 0401  WBC 9.5 10.0 8.1 8.7 8.3  NEUTROABS  --   --  5.9 6.5 6.0  HGB 12.2* 13.0 12.4* 12.6* 11.5*  HCT 37.1* 39.9 38.4* 37.7* 34.9*  MCV 98.7 99.8 99.0 98.7 97.5  PLT 227 234 232 226 366   Basic Metabolic Panel: Recent Labs  Lab 03/21/22 0342 03/22/22 0538 03/23/22 0421 03/24/22 0404 03/25/22 0401  NA 134* 136 135 135 133*  K 3.4* 3.9 4.0 4.1 4.0  CL 104 104 102 100 97*  CO2 '25 23 26 27 28  '$ GLUCOSE 112* 102* 80 70 101*  BUN 29* 25* 26* 27* 21  CREATININE 1.04 0.92 1.02 1.12 1.07  CALCIUM 8.3* 8.2* 8.2* 8.1* 8.0*  MG 1.5* 1.9 2.1 2.1 1.8   GFR: Estimated Creatinine Clearance: 57 mL/min (by C-G formula based on SCr of 1.07 mg/dL). Liver  Function Tests: Recent Labs  Lab 03/21/22 0342 03/22/22 0538 03/23/22 0421 03/24/22 0404 03/25/22 0401  AST 18 12* 11* 11* 12*  ALT '12 12 10 9 9  '$ ALKPHOS 59 59 57 55 52  BILITOT 1.8* 2.1* 2.2* 2.0* 1.6*  PROT 5.8* 5.6* 5.3* 5.1* 5.3*  ALBUMIN 3.0* 3.1* 2.9* 2.7* 2.8*      Code Status:  Full code.  Code status decision has been confirmed with: patient Family Communication: Wife has been updated on plan of care in the hallway (1/16).   Severity of Illness:  The appropriate patient status for this patient is INPATIENT. Inpatient status is judged to be reasonable and necessary in order to provide the required intensity of service to ensure the patient's safety. The patient's presenting symptoms, physical exam findings, and initial radiographic and laboratory data in the context of their chronic comorbidities is felt to place them at high risk for further clinical deterioration. Furthermore, it is not anticipated that the patient will be medically stable for discharge from the hospital within 2 midnights of admission.   * I certify that at the point of admission it is my clinical judgment that the patient will require inpatient hospital care spanning beyond 2 midnights from the point of admission due to high intensity of service, high risk for further deterioration and high frequency of surveillance required.*  Time spent:  40 minutes  Author:  Vernelle Emerald MD  03/26/2022 12:55 AM

## 2022-03-26 NOTE — Progress Notes (Signed)
Mobility Specialist - Progress Note   03/26/22 0902  Mobility  Activity Ambulated with assistance in hallway  Level of Assistance Independent after set-up  Assistive Device Front wheel walker  Distance Ambulated (ft) 350 ft  Activity Response Tolerated well  Mobility Referral Yes  $Mobility charge 1 Mobility   Pt received in bed and agreed to mobility. Started feeling winded nearing EOS, felt that yesterdays session was easier than today's. Pt returned to chair with all needs met.  Roderick Pee Mobility Specialist

## 2022-03-26 NOTE — Progress Notes (Signed)
POSEIDON PAM UUV:[id number] DOB: 08/28/1934 DOA: 03/20/2022 PCP: [name], MD   Subj: 88 year old wm PMHx follicular lymphoma (follows with Dr. Lorenso Courier, not on treatment),  severe 3-vessel coronary artery disease (S/P STEMI 10/2015 with DES to LAD and RCA), ischemic cardiomyopathy (Echo 10/2015 45-50% with G1DD, repeat echo 07/2021 unable to get EF), prior embolic stroke after 4742 PCI, hypertension, hyperlipidemia, prostate cancer (S/P radiation and seed implantation 2010), obstructive sleep apnea on CPAP   Presenting to [facility] long hospital 1/11 with complaints of right lower quadrant abdominal pain.   Of note, patient has a history of multiple abdominal surgeries including colostomy and reversal.  Patient additionally has a history of multiple small bowel obstructions.  Patient reports that he had a colonoscopy approximately 1 week ago with abdominal pain that began thereafter.   Upon evaluation in the emergency department on this presentation, CT imaging of the abdomen and pelvis revealed recurrent small bowel obstruction with suspected transition zone within the left pelvis.  Hospitalist group was contacted and patient was admitted to the hospital.   General surgery was consulted.  NG tube was placed and set to low intermittent suction while patient was kept NPO.  Obstruction was evaluated by a Gastrografin study with small bowel protocol.   In the days that followed patient's abdominal pain, abdominal distention and nausea gradually improved.  Eventually, NG tube was removed and patient was able to tolerate a clear liquid diet which was advanced as tolerated   Obj: A/O x 4, states feels burning sensation when he begins to urinate, Otherwise no complaints.   Objective: VITAL SIGNS: Temp: 98.1 F (36.7 C) (01/17 1403) Temp Source: Oral (01/17 1403) BP: 153/74 (01/17 1403) Pulse Rate: 65 (01/17 1403)   VENTILATOR SETTINGS: **   Intake/Output Summary (Last 24 hours) at  03/26/2022 1528 Last data filed at 03/26/2022 1300 Gross per 24 hour  Intake 120 ml  Output 1000 ml  Net -880 ml     Exam: Physical Exam:  General: A/O x 4, No acute respiratory distress Eyes: negative scleral hemorrhage, negative anisocoria, negative icterus ENT: Negative Runny nose, negative gingival bleeding, Neck:  Negative scars, masses, torticollis, lymphadenopathy, JVD Lungs: Clear to auscultation bilaterally without wheezes or crackles Cardiovascular: Regular rate and rhythm without murmur gallop or rub normal S1 and S2 Abdomen: negative abdominal pain, nondistended, positive soft, bowel sounds, no rebound, no ascites, no appreciable mass Extremities: No significant cyanosis, clubbing, or edema bilateral lower extremities Skin: Negative rashes, lesions, ulcers Psychiatric:  Negative depression, negative anxiety, negative fatigue, negative mania  Central nervous system:  Cranial nerves II through XII intact, tongue/uvula midline, negative dysarthria, negative expressive aphasia, negative receptive aphasia.   .     Mobility Assessment (last 72 hours)     Mobility Assessment     Row Name 03/26/22 0930 03/25/22 2020 03/25/22 1000       Does patient have an order for bedrest or is patient medically unstable No - Continue assessment No - Continue assessment No - Continue assessment     What is the highest level of mobility based on the progressive mobility assessment? Level 5 (Walks with assist in room/hall) - Balance while stepping forward/back and can walk in room with assist - Complete Level 5 (Walks with assist in room/hall) - Balance while stepping forward/back and can walk in room with assist - Complete Level 5 (Walks with assist in room/hall) - Balance while stepping forward/back and can walk in room with assist - Complete  DVT prophylaxis:  Code Status: Full Family Communication: 1/17 wife at bedside for discussion of plan of care all questions  answered Status is: Inpatient    Dispo: The patient is from: Home              Anticipated d/c is to: SNF              Anticipated d/c date is: 1 day              Patient currently is not medically stable to d/c.    Procedures/Significant Events:    Consultants:     Cultures 1/17 urine pending  Antimicrobials: Anti-infectives (From admission, onward)    None         Assessment & Plan: Covid vaccination;   Principal Problem:   SBO (small bowel obstruction) (HCC) Active Problems:   Hyponatremia   Hypokalemia   Chronic diastolic CHF (congestive heart failure) (HCC)   Coronary artery disease involving native coronary artery of native heart without angina pectoris   Essential hypertension   Mixed hyperlipidemia   Follicular lymphoma, unspecified, intra-abdominal lymph nodes (HCC)   OSA on CPAP   Hypomagnesemia   History of prostate cancer   Dyslipidemia   Lymphoma (HCC)  SBO (small bowel obstruction) ([place]) -1/12 confirmed partial small bowel obstruction with 24-hour delay film revealing contrast progression throughout the colon. -Obstruction likely secondary to adhesions due to patient's numerous abdominal surgeries  -1/12 per Dr. Lorenso Courier  oncology felt that patient's intra-abdominal lymphadenopathy is unlikely to be contributing to the patient's obstruction.   -General surgery following daily, -Per patient and wife tolerated lunch.  If patient tolerates dinner will discharge in a.m.    Hyponatremia -1/17 mild asymptomatic.  Most likely secondary to mild dehydration - 1/17 normal saline 58m/hr     Chronic diastolic CHF (congestive heart failure) (HCC) -No clinical evidence of cardiogenic volume overload -Strict in and out - Daily weight    CAD involving native coronary artery without angina pectoris -Negative chest pain  -NTG PRN -Resuming aspirin, beta-blocker and statin therapy     Essential hypertension -Resuming home antihypertensive  regimen -PRN IV antihypertensives.   Mixed hyperlipidemia -Resuming home regimen of statin therapy   Follicular lymphoma, unspecified, intra-abdominal lymph nodes ([place] -Follows with Dr. DLorenso Courieroncology in the outpatient setting -No current active treatment -Case discussed with Dr. DLorenso Courieron admission,  -Patient to continue to follow-up as an outpatient at discharge.   OSA on CPAP -Holding off on CPAP due to bowel obstruction -Supplemental oxygen to be provided nightly instead   Hypokalemia -Resolved    Hypomagnesemia -Resolved          Mobility Assessment (last 72 hours)     Mobility Assessment     Row Name 03/26/22 0930 03/25/22 2020 03/25/22 1000       Does patient have an order for bedrest or is patient medically unstable No - Continue assessment No - Continue assessment No - Continue assessment     What is the highest level of mobility based on the progressive mobility assessment? Level 5 (Walks with assist in room/hall) - Balance while stepping forward/back and can walk in room with assist - Complete Level 5 (Walks with assist in room/hall) - Balance while stepping forward/back and can walk in room with assist - Complete Level 5 (Walks with assist in room/hall) - Balance while stepping forward/back and can walk in room with assist - Complete  Time: 50 minutes         Care during the described time interval was provided by me .  I have reviewed this patient's available data, including medical history, events of note, physical examination, and all test results as part of my evaluation.

## 2022-03-26 NOTE — Progress Notes (Signed)
Mobility Specialist - Progress Note   03/26/22 1353  Mobility  Activity Ambulated with assistance in hallway  Level of Assistance Modified independent, requires aide device or extra time  Assistive Device Front wheel walker  Distance Ambulated (ft) 350 ft  Activity Response Tolerated well  Mobility Referral Yes  $Mobility charge 1 Mobility   Pt received in bed and agreed to another mobility session. Pt felt better than this AM session but still slightly winded and fatigued. Pt returned to bed with all needs met.  Roderick Pee Mobility Specialist

## 2022-03-26 NOTE — Plan of Care (Signed)

## 2022-03-27 ENCOUNTER — Other Ambulatory Visit: Payer: Self-pay | Admitting: Cardiology

## 2022-03-27 ENCOUNTER — Inpatient Hospital Stay (HOSPITAL_COMMUNITY): Payer: Medicare Other

## 2022-03-27 DIAGNOSIS — N410 Acute prostatitis: Secondary | ICD-10-CM

## 2022-03-27 DIAGNOSIS — K56609 Unspecified intestinal obstruction, unspecified as to partial versus complete obstruction: Secondary | ICD-10-CM | POA: Diagnosis not present

## 2022-03-27 DIAGNOSIS — I10 Essential (primary) hypertension: Secondary | ICD-10-CM | POA: Diagnosis not present

## 2022-03-27 DIAGNOSIS — K567 Ileus, unspecified: Secondary | ICD-10-CM | POA: Diagnosis not present

## 2022-03-27 DIAGNOSIS — E871 Hypo-osmolality and hyponatremia: Secondary | ICD-10-CM | POA: Diagnosis not present

## 2022-03-27 DIAGNOSIS — E785 Hyperlipidemia, unspecified: Secondary | ICD-10-CM | POA: Diagnosis not present

## 2022-03-27 LAB — MAGNESIUM: Magnesium: 1.7 mg/dL (ref 1.7–2.4)

## 2022-03-27 LAB — COMPREHENSIVE METABOLIC PANEL
ALT: 12 U/L (ref 0–44)
AST: 17 U/L (ref 15–41)
Albumin: 2.6 g/dL — ABNORMAL LOW (ref 3.5–5.0)
Alkaline Phosphatase: 49 U/L (ref 38–126)
Anion gap: 7 (ref 5–15)
BUN: 19 mg/dL (ref 8–23)
CO2: 28 mmol/L (ref 22–32)
Calcium: 8.1 mg/dL — ABNORMAL LOW (ref 8.9–10.3)
Chloride: 97 mmol/L — ABNORMAL LOW (ref 98–111)
Creatinine, Ser: 1.09 mg/dL (ref 0.61–1.24)
GFR, Estimated: >60 mL/min (ref >60)
Glucose, Bld: 119 mg/dL — ABNORMAL HIGH (ref 70–99)
Potassium: 4.1 mmol/L (ref 3.5–5.1)
Sodium: 132 mmol/L — ABNORMAL LOW (ref 135–145)
Total Bilirubin: 1.4 mg/dL — ABNORMAL HIGH (ref 0.3–1.2)
Total Protein: 5.4 g/dL — ABNORMAL LOW (ref 6.5–8.1)

## 2022-03-27 LAB — CBC WITH DIFFERENTIAL/PLATELET
Abs Immature Granulocytes: 0.04 10*3/uL (ref 0.00–0.07)
Basophils Absolute: 0 10*3/uL (ref 0.0–0.1)
Basophils Relative: 0 %
Eosinophils Absolute: 0.1 10*3/uL (ref 0.0–0.5)
Eosinophils Relative: 1 %
HCT: 36.2 % — ABNORMAL LOW (ref 39.0–52.0)
Hemoglobin: 12 g/dL — ABNORMAL LOW (ref 13.0–17.0)
Immature Granulocytes: 0 %
Lymphocytes Relative: 8 %
Lymphs Abs: 0.8 10*3/uL (ref 0.7–4.0)
MCH: 32.2 pg (ref 26.0–34.0)
MCHC: 33.1 g/dL (ref 30.0–36.0)
MCV: 97.1 fL (ref 80.0–100.0)
Monocytes Absolute: 1.2 10*3/uL — ABNORMAL HIGH (ref 0.1–1.0)
Monocytes Relative: 13 %
Neutro Abs: 7.6 10*3/uL (ref 1.7–7.7)
Neutrophils Relative %: 78 %
Platelets: 236 10*3/uL (ref 150–400)
RBC: 3.73 MIL/uL — ABNORMAL LOW (ref 4.22–5.81)
RDW: 14.2 % (ref 11.5–15.5)
WBC: 9.8 10*3/uL (ref 4.0–10.5)
nRBC: 0 % (ref 0.0–0.2)

## 2022-03-27 LAB — PHOSPHORUS: Phosphorus: 3.5 mg/dL (ref 2.5–4.6)

## 2022-03-27 MED ORDER — METOCLOPRAMIDE HCL 5 MG/ML IJ SOLN
10.0000 mg | Freq: Three times a day (TID) | INTRAMUSCULAR | Status: DC
  Administered 2022-03-27 – 2022-03-29 (×6): 10 mg via INTRAVENOUS
  Filled 2022-03-27 (×7): qty 2

## 2022-03-27 MED ORDER — BISACODYL 10 MG RE SUPP
10.0000 mg | Freq: Once | RECTAL | Status: AC
  Administered 2022-03-27: 10 mg via RECTAL
  Filled 2022-03-27: qty 1

## 2022-03-27 MED ORDER — DEXTROSE-NACL 5-0.9 % IV SOLN: INTRAVENOUS | Status: DC

## 2022-03-27 MED ORDER — CIPROFLOXACIN IN D5W 400 MG/200ML IV SOLN
400.0000 mg | Freq: Two times a day (BID) | INTRAVENOUS | Status: DC
  Administered 2022-03-27 – 2022-03-29 (×4): 400 mg via INTRAVENOUS
  Filled 2022-03-27 (×6): qty 200

## 2022-03-27 NOTE — Care Management Important Message (Signed)
Important Message  Patient Details IM Letter given. Name: NEWMAN WAREN MRN: [id number] Date of Birth: 11-24-1934   Medicare Important Message Given:  Yes     Kerin Salen 03/27/2022, 9:20 AM

## 2022-03-27 NOTE — Progress Notes (Signed)
Progress Note     Subjective: No BM for 2 days and passing small amount of flatus. Ate soft food yesterday but did not eat breakfast this am due to fullness and no appetite. No nausea or emesis. Having intermittent abdominal pain as well as penile pain without dysuria or hematuria. Ambulating in hallway.  Objective: Vital signs in last 24 hours: Temp:  [98.1 F (36.7 C)-98.5 F (36.9 C)] 98.3 F (36.8 C) (01/18 0344) Pulse Rate:  [60-65] 60 (01/18 0344) Resp:  [18-20] 20 (01/18 0344) BP: (147-153)/(74-78) 147/74 (01/18 0344) SpO2:  [94 %-96 %] 94 % (01/18 0344) Weight:  [92.9 kg] 92.9 kg (01/18 0344) Last BM Date : 03/25/22  Intake/Output from previous day: 01/17 0701 - 01/18 0700 In: 6206.2 [P.O.:620; I.V.:5586.2] Out: 1700 [Urine:1700] Intake/Output this shift: No intake/output data recorded.  PE: General: pleasant, WD, WN male who is sitting up in chair in NAD Heart: regular, rate, and rhythm.   Lungs:  Respiratory effort nonlabored Abd: soft, mild TTP suprapubic and RLQ without rebound or guarding, mild to moderate distension, +BS,  multiple well healed surgical scars Psych: A&Ox3 with an appropriate affect.    Lab Results:  Recent Labs    03/25/22 0401 03/27/22 0358  WBC 8.3 9.8  HGB 11.5* 12.0*  HCT 34.9* 36.2*  PLT 212 236    BMET Recent Labs    03/25/22 0401 03/27/22 0358  NA 133* 132*  K 4.0 4.1  CL 97* 97*  CO2 28 28  GLUCOSE 101* 119*  BUN 21 19  CREATININE 1.07 1.09  CALCIUM 8.0* 8.1*    PT/INR No results for input(s): "LABPROT", "INR" in the last 72 hours. CMP     Component Value Date/Time   NA 132 (L) 03/27/2022 0358   NA 140 05/06/2018 1103   K 4.1 03/27/2022 0358   CL 97 (L) 03/27/2022 0358   CO2 28 03/27/2022 0358   GLUCOSE 119 (H) 03/27/2022 0358   BUN 19 03/27/2022 0358   BUN 27 05/06/2018 1103   CREATININE 1.09 03/27/2022 0358   CREATININE 1.10 12/23/2019 1204   CALCIUM 8.1 (L) 03/27/2022 0358   PROT 5.4 (L)  03/27/2022 0358   PROT 6.1 05/06/2018 1103   ALBUMIN 2.6 (L) 03/27/2022 0358   ALBUMIN 3.9 05/06/2018 1103   AST 17 03/27/2022 0358   ALT 12 03/27/2022 0358   ALKPHOS 49 03/27/2022 0358   BILITOT 1.4 (H) 03/27/2022 0358   BILITOT 1.2 05/06/2018 1103   GFRNONAA >60 03/27/2022 0358   GFRNONAA 62 12/23/2019 1204   GFRAA 72 12/23/2019 1204   Lipase     Component Value Date/Time   LIPASE 30 03/20/2022 2047       Studies/Results: No results found.  Anti-infectives: Anti-infectives (From admission, onward)    None        Assessment/Plan  Recurrent SBO - last SBO 11/2020, he reports 2-3 total - multiple prior abdominal surgeries  - CT 1/11 shows SBO with suspected transition zone within the pelvis on the left - 24 h delay film shows contrast progression through colon - NGT out 1/15, has had multiple bowel movements but now none for 2 days and more distended - check abd xray today - soft diet yesterday but low appetite this am. Added tramadol for pain control. Added miralax qd and senokot bid. Increase bowel regimen pending xray - continue to mobilize - recommend keeping K>4.0 and Mg >2.0 to optimize bowel function  - No indication for acute surgical intervention.  ID - none VTE - lovenox FEN - IVF, soft Foley - none   Follicular lymphoma - follows with Dr. Lorenso Courier, looks like bone mets are new on this CT scan compared to previous 07/2021, he will need outpatient oncology follow up CAD/ hx MI 2017 s/p stent placement Hx embolic CVA secondary to PCI procedure HTN HLD Hx prostate cancer s/p XRT and radiation seed implantation 2010 OSA on CPAP   LOS: 7 days   I reviewed hospitalist notes, last 24 h vitals and pain scores, last 48 h intake and output, last 24 h labs and trends, and last 24 h imaging results.    Winferd Humphrey, [facility] 03/27/2022, 8:47 AM Please see Amion for pager number during day hours 7:00am-4:30pm

## 2022-03-27 NOTE — Progress Notes (Signed)
Jose Ware NFA:[id number] DOB: 1934-04-24 DOA: 03/20/2022 PCP: [name], MD   Subj: 88 year old wm PMHx follicular lymphoma (follows with Dr. Lorenso Courier, not on treatment),  severe 3-vessel coronary artery disease (S/P STEMI 10/2015 with DES to LAD and RCA), ischemic cardiomyopathy (Echo 10/2015 45-50% with G1DD, repeat echo 07/2021 unable to get EF), prior embolic stroke after 4696 PCI, hypertension, hyperlipidemia, prostate cancer (S/P radiation and seed implantation 2010), obstructive sleep apnea on CPAP   Presenting to [facility] hospital 1/11 with complaints of right lower quadrant abdominal pain.   Of note, patient has a history of multiple abdominal surgeries including colostomy and reversal.  Patient additionally has a history of multiple small bowel obstructions.  Patient reports that he had a colonoscopy approximately 1 week ago with abdominal pain that began thereafter.   Upon evaluation in the emergency department on this presentation, CT imaging of the abdomen and pelvis revealed recurrent small bowel obstruction with suspected transition zone within the left pelvis.  Hospitalist group was contacted and patient was admitted to the hospital.   General surgery was consulted.  NG tube was placed and set to low intermittent suction while patient was kept NPO.  Obstruction was evaluated by a Gastrografin study with small bowel protocol.   In the days that followed patient's abdominal pain, abdominal distention and nausea gradually improved.  Eventually, NG tube was removed and patient was able to tolerate a clear liquid diet which was advanced as tolerated   Obj: 1/18 A/O x 4, negative BM overnight, positive increased abdominal pain, increased pain posterior prostate with increased gas.  Continue dysuria    Objective: VITAL SIGNS: Temp: 97.6 F (36.4 C) (01/18 1327) Temp Source: Oral (01/18 1000) BP: 130/71 (01/18 1327) Pulse Rate: 61 (01/18 1327)   VENTILATOR  SETTINGS: **   Intake/Output Summary (Last 24 hours) at 03/27/2022 1424 Last data filed at 03/27/2022 1200 Gross per 24 hour  Intake 6833.64 ml  Output 1400 ml  Net 5433.64 ml    Physical Exam:  General: A/O x 4, No acute respiratory distress Eyes: negative scleral hemorrhage, negative anisocoria, negative icterus ENT: Negative Runny nose, negative gingival bleeding, Neck:  Negative scars, masses, torticollis, lymphadenopathy, JVD Lungs: Clear to auscultation bilaterally without wheezes or crackles Cardiovascular: Regular rate and rhythm without murmur gallop or rub normal S1 and S2 Abdomen: negative abdominal pain, nondistended, positive soft, bowel sounds, no rebound, no ascites, no appreciable mass Extremities: No significant cyanosis, clubbing, or edema bilateral lower extremities Skin: Negative rashes, lesions, ulcers Genitourinary: Negative pain to palpation of anterior prostate/testicles.  Testicular/erythematous Psychiatric:  Negative depression, negative anxiety, negative fatigue, negative mania  Central nervous system:  Cranial nerves II through XII intact, tongue/uvula midline, all extremities muscle strength 5/5, sensation intact throughout, negative dysarthria, negative expressive aphasia, negative receptive aphasia.   .     Mobility Assessment (last 72 hours)     Mobility Assessment     Row Name 03/27/22 1100 03/26/22 1957 03/26/22 0930 03/25/22 2020 03/25/22 1000   Does patient have an order for bedrest or is patient medically unstable No - Continue assessment No - Continue assessment No - Continue assessment No - Continue assessment No - Continue assessment   What is the highest level of mobility based on the progressive mobility assessment? Level 5 (Walks with assist in room/hall) - Balance while stepping forward/back and can walk in room with assist - Complete Level 5 (Walks with assist in room/hall) - Balance while stepping forward/back and can  walk in room with  assist - Complete Level 5 (Walks with assist in room/hall) - Balance while stepping forward/back and can walk in room with assist - Complete Level 5 (Walks with assist in room/hall) - Balance while stepping forward/back and can walk in room with assist - Complete Level 5 (Walks with assist in room/hall) - Balance while stepping forward/back and can walk in room with assist - Complete   Is the above level different from baseline mobility prior to current illness? Yes - Recommend PT order -- -- -- --              DVT prophylaxis:  Code Status: Full Family Communication: 1/17 wife at bedside for discussion of plan of care all questions answered Status is: Inpatient    Dispo: The patient is from: Home              Anticipated d/c is to: SNF              Anticipated d/c date is: 1 day              Patient currently is not medically stable to d/c.    Procedures/Significant Events:    Consultants:     Cultures 1/17 urine pending  Antimicrobials: Anti-infectives (From admission, onward)    None         Assessment & Plan: Covid vaccination;   Principal Problem:   SBO (small bowel obstruction) (HCC) Active Problems:   Hyponatremia   Hypokalemia   Chronic diastolic CHF (congestive heart failure) (HCC)   Coronary artery disease involving native coronary artery of native heart without angina pectoris   Essential hypertension   Mixed hyperlipidemia   Follicular lymphoma, unspecified, intra-abdominal lymph nodes (HCC)   OSA on CPAP   Hypomagnesemia   History of prostate cancer   Dyslipidemia   Lymphoma (HCC)   Ileus (HCC)  SBO (small bowel obstruction) ([place]) -1/12 confirmed partial small bowel obstruction with 24-hour delay film revealing contrast progression throughout the colon. -Obstruction likely secondary to adhesions due to patient's numerous abdominal surgeries  -1/12 per Dr. Lorenso Courier  oncology felt that patient's intra-abdominal lymphadenopathy is unlikely  to be contributing to the patient's obstruction.   -General surgery following daily, -Per patient and wife tolerated lunch.  If patient tolerates dinner will discharge in a.m.  Ileus - 1/18 KUB consistent with ileus -1/18 we will continue soft diet as tolerated - 1/18 Reglan IV 10 mg TID - 1/18 if patient starts complaining of nausea/vomiting will place NG tube to intermittent wall suction. -1/18 D5-0.9% saline 181m/hr   Hyponatremia -1/17 mild asymptomatic.  Most likely secondary to mild dehydration -1/18 see ileus   Chronic diastolic CHF (congestive heart failure) (HCC) -No clinical evidence of cardiogenic volume overload -Strict in and out +5.3 L - Daily weight  Filed Weights   03/20/22 2332 03/27/22 0344  Weight: 90.5 kg 92.9 kg      CAD involving native coronary artery without angina pectoris -Negative chest pain  -NTG PRN     Essential hypertension - Amlodipine 5 mg daily - Coreg 3.125 mg BID-hydralazine PRN - Losartan 100 mg daily    Mixed hyperlipidemia - Atorvastatin 10 mg Daily   Follicular lymphoma, unspecified, [place]abdominal lymph nodes [place] -Follows with Dr. DLorenso Courieroncology in the outpatient setting -No current active treatment -Case discussed with Dr. DLorenso Courieron admission,  -Patient to continue to follow-up as an outpatient at discharge.   OSA on CPAP -Holding off on CPAP due to  bowel obstruction -Supplemental oxygen to be provided nightly instead  UTI/Prostatitis -1/18 empirically start ciprofloxacin 400 mg IV BID    Hypokalemia -Resolved    Hypomagnesemia -Resolved          Mobility Assessment (last 72 hours)     Mobility Assessment     Row Name 03/27/22 1100 03/26/22 1957 03/26/22 0930 03/25/22 2020 03/25/22 1000   Does patient have an order for bedrest or is patient medically unstable No - Continue assessment No - Continue assessment No - Continue assessment No - Continue assessment No - Continue assessment   What is the  highest level of mobility based on the progressive mobility assessment? Level 5 (Walks with assist in room/hall) - Balance while stepping forward/back and can walk in room with assist - Complete Level 5 (Walks with assist in room/hall) - Balance while stepping forward/back and can walk in room with assist - Complete Level 5 (Walks with assist in room/hall) - Balance while stepping forward/back and can walk in room with assist - Complete Level 5 (Walks with assist in room/hall) - Balance while stepping forward/back and can walk in room with assist - Complete Level 5 (Walks with assist in room/hall) - Balance while stepping forward/back and can walk in room with assist - Complete   Is the above level different from baseline mobility prior to current illness? Yes - Recommend PT order -- -- -- --             Time: 50 minutes         Care during the described time interval was provided by me .  I have reviewed this patient's available data, including medical history, events of note, physical examination, and all test results as part of my evaluation.

## 2022-03-27 NOTE — Progress Notes (Signed)
Mobility Specialist - Progress Note   03/27/22 0918  Mobility  Activity Ambulated with assistance in hallway  Level of Assistance Standby assist, set-up cues, supervision of patient - no hands on  Assistive Device Front wheel walker  Distance Ambulated (ft) 350 ft  Activity Response Tolerated well  Mobility Referral Yes  $Mobility charge 1 Mobility   Pt received in bed and eager to ambulate. Pt had no c/o pain nor discomfort during session. Pt somewhat discouraged that he is still in hospital. Pt returned to chair with all needs met.  Roderick Pee Mobility Specialist

## 2022-03-27 NOTE — Progress Notes (Signed)
Mobility Specialist - Progress Note   03/27/22 1408  Mobility  Activity Ambulated with assistance in hallway  Level of Assistance Modified independent, requires aide device or extra time  Assistive Device Front wheel walker  Distance Ambulated (ft) 350 ft  Activity Response Tolerated well  Mobility Referral Yes  $Mobility charge 1 Mobility   Pt received in bed and agreed to mobility. Had no issues during sesson, Min A for bed mobility. Pt returned to chair with all needs met.   Roderick Pee Mobility Specialist

## 2022-03-28 DIAGNOSIS — E785 Hyperlipidemia, unspecified: Secondary | ICD-10-CM | POA: Diagnosis not present

## 2022-03-28 DIAGNOSIS — E871 Hypo-osmolality and hyponatremia: Secondary | ICD-10-CM | POA: Diagnosis not present

## 2022-03-28 DIAGNOSIS — I10 Essential (primary) hypertension: Secondary | ICD-10-CM | POA: Diagnosis not present

## 2022-03-28 DIAGNOSIS — K56609 Unspecified intestinal obstruction, unspecified as to partial versus complete obstruction: Secondary | ICD-10-CM | POA: Diagnosis not present

## 2022-03-28 LAB — CBC WITH DIFFERENTIAL/PLATELET
Abs Immature Granulocytes: 0.02 10*3/uL (ref 0.00–0.07)
Basophils Absolute: 0 10*3/uL (ref 0.0–0.1)
Basophils Relative: 0 %
Eosinophils Absolute: 0.1 10*3/uL (ref 0.0–0.5)
Eosinophils Relative: 2 %
HCT: 33.1 % — ABNORMAL LOW (ref 39.0–52.0)
Hemoglobin: 10.9 g/dL — ABNORMAL LOW (ref 13.0–17.0)
Immature Granulocytes: 0 %
Lymphocytes Relative: 11 %
Lymphs Abs: 0.7 10*3/uL (ref 0.7–4.0)
MCH: 31.8 pg (ref 26.0–34.0)
MCHC: 32.9 g/dL (ref 30.0–36.0)
MCV: 96.5 fL (ref 80.0–100.0)
Monocytes Absolute: 0.9 10*3/uL (ref 0.1–1.0)
Monocytes Relative: 14 %
Neutro Abs: 4.8 10*3/uL (ref 1.7–7.7)
Neutrophils Relative %: 73 %
Platelets: 222 10*3/uL (ref 150–400)
RBC: 3.43 MIL/uL — ABNORMAL LOW (ref 4.22–5.81)
RDW: 14.2 % (ref 11.5–15.5)
WBC: 6.6 10*3/uL (ref 4.0–10.5)
nRBC: 0 % (ref 0.0–0.2)

## 2022-03-28 LAB — COMPREHENSIVE METABOLIC PANEL
ALT: 11 U/L (ref 0–44)
AST: 16 U/L (ref 15–41)
Albumin: 2.3 g/dL — ABNORMAL LOW (ref 3.5–5.0)
Alkaline Phosphatase: 45 U/L (ref 38–126)
Anion gap: 9 (ref 5–15)
BUN: 18 mg/dL (ref 8–23)
CO2: 27 mmol/L (ref 22–32)
Calcium: 7.9 mg/dL — ABNORMAL LOW (ref 8.9–10.3)
Chloride: 100 mmol/L (ref 98–111)
Creatinine, Ser: 0.91 mg/dL (ref 0.61–1.24)
GFR, Estimated: >60 mL/min (ref >60)
Glucose, Bld: 108 mg/dL — ABNORMAL HIGH (ref 70–99)
Potassium: 3.4 mmol/L — ABNORMAL LOW (ref 3.5–5.1)
Sodium: 136 mmol/L (ref 135–145)
Total Bilirubin: 0.9 mg/dL (ref 0.3–1.2)
Total Protein: 5.1 g/dL — ABNORMAL LOW (ref 6.5–8.1)

## 2022-03-28 LAB — URINE CULTURE: Culture: <10000 — AB

## 2022-03-28 LAB — LIPID PANEL
Cholesterol: 68 mg/dL (ref 0–200)
HDL: 20 mg/dL — ABNORMAL LOW (ref >40)
LDL Cholesterol: 37 mg/dL (ref 0–99)
Total CHOL/HDL Ratio: 3.4 RATIO
Triglycerides: 56 mg/dL (ref <150)
VLDL: 11 mg/dL (ref 0–40)

## 2022-03-28 LAB — MAGNESIUM: Magnesium: 1.6 mg/dL — ABNORMAL LOW (ref 1.7–2.4)

## 2022-03-28 LAB — PHOSPHORUS: Phosphorus: 3.4 mg/dL (ref 2.5–4.6)

## 2022-03-28 MED ORDER — MAGNESIUM SULFATE 4 GM/100ML IV SOLN
4.0000 g | Freq: Once | INTRAVENOUS | Status: AC
  Administered 2022-03-28: 4 g via INTRAVENOUS
  Filled 2022-03-28: qty 100

## 2022-03-28 MED ORDER — POTASSIUM CHLORIDE 10 MEQ/100ML IV SOLN
10.0000 meq | INTRAVENOUS | Status: AC
  Administered 2022-03-28 (×6): 10 meq via INTRAVENOUS
  Filled 2022-03-28 (×6): qty 100

## 2022-03-28 NOTE — Progress Notes (Signed)
Jose Ware NUU:[id number] DOB: 1934-07-13 DOA: 03/20/2022 PCP: Jose Olp, MD   Subj: 88 year old wm PMHx follicular lymphoma (follows with Dr. Lorenso Ware, not on treatment),  severe 3-vessel coronary artery disease (S/P STEMI 10/2015 with DES to LAD and RCA), ischemic cardiomyopathy (Echo 10/2015 45-50% with G1DD, repeat echo 07/2021 unable to get EF), prior embolic stroke after 3474 PCI, hypertension, hyperlipidemia, prostate cancer (S/P radiation and seed implantation 2010), obstructive sleep apnea on CPAP   Presenting to [facility] 1/11 with complaints of right lower quadrant abdominal pain.   Of note, patient has a history of multiple abdominal surgeries including colostomy and reversal.  Patient additionally has a history of multiple small bowel obstructions.  Patient reports that he had a colonoscopy approximately 1 week ago with abdominal pain that began thereafter.   Upon evaluation in the emergency department on this presentation, CT imaging of the abdomen and pelvis revealed recurrent small bowel obstruction with suspected transition zone within the left pelvis.  Hospitalist group was contacted and patient was admitted to the hospital.   General surgery was consulted.  NG tube was placed and set to low intermittent suction while patient was kept NPO.  Obstruction was evaluated by a Gastrografin study with small bowel protocol.   In the days that followed patient's abdominal pain, abdominal distention and nausea gradually improved.  Eventually, NG tube was removed and patient was able to tolerate a clear liquid diet which was advanced as tolerated   Obj: 1/19 A/O x 4, negative nausea.  Negative vomiting.  States 2 x BM   Objective: VITAL SIGNS: Temp: 97.6 F (36.4 C) (01/19 1353) Temp Source: Oral (01/19 0405) BP: 126/67 (01/19 1353) Pulse Rate: 57 (01/19 1353)   VENTILATOR SETTINGS: **   Intake/Output Summary (Last 24 hours) at 03/28/2022 1511 Last data  filed at 03/28/2022 1427 Gross per 24 hour  Intake 2990.87 ml  Output 2100 ml  Net 890.87 ml    Physical Exam:  General: A/O x 4 No acute respiratory distress Eyes: negative scleral hemorrhage, negative anisocoria, negative icterus ENT: Negative Runny nose, negative gingival bleeding, Neck:  Negative scars, masses, torticollis, lymphadenopathy, JVD Lungs: Clear to auscultation bilaterally without wheezes or crackles Cardiovascular: Regular rate and rhythm without murmur gallop or rub normal S1 and S2 Abdomen: negative abdominal pain, nondistended, positive soft, bowel sounds, no rebound, no ascites, no appreciable mass Extremities: No significant cyanosis, clubbing, or edema bilateral lower extremities Skin: Negative rashes, lesions, ulcers Psychiatric:  Negative depression, negative anxiety, negative fatigue, negative mania  Central nervous system:  Cranial nerves II through XII intact, tongue/uvula midline, all extremities muscle strength 5/5, sensation intact throughout, negative dysarthria, negative expressive aphasia, negative receptive aphasia.    .     Mobility Assessment (last 72 hours)     Mobility Assessment     Row Name 03/28/22 0821 03/27/22 1943 03/27/22 1100 03/26/22 1957 03/26/22 0930   Does patient have an order for bedrest or is patient medically unstable No - Continue assessment No - Continue assessment No - Continue assessment No - Continue assessment No - Continue assessment   What is the highest level of mobility based on the progressive mobility assessment? Level 5 (Walks with assist in room/hall) - Balance while stepping forward/back and can walk in room with assist - Complete Level 5 (Walks with assist in room/hall) - Balance while stepping forward/back and can walk in room with assist - Complete Level 5 (Walks with assist in room/hall) - Balance while  stepping forward/back and can walk in room with assist - Complete Level 5 (Walks with assist in room/hall) -  Balance while stepping forward/back and can walk in room with assist - Complete Level 5 (Walks with assist in room/hall) - Balance while stepping forward/back and can walk in room with assist - Complete   Is the above level different from baseline mobility prior to current illness? No - Consider discontinuing PT/OT No - Consider discontinuing PT/OT Yes - Recommend PT order -- --    Jose Ware Name 03/25/22 2020           Does patient have an order for bedrest or is patient medically unstable No - Continue assessment       What is the highest level of mobility based on the progressive mobility assessment? Level 5 (Walks with assist in room/hall) - Balance while stepping forward/back and can walk in room with assist - Complete                  DVT prophylaxis:  Code Status: Full Family Communication: 1/17 wife at bedside for discussion of plan of care all questions answered Status is: Inpatient    Dispo: The patient is from: Home              Anticipated d/c is to: SNF              Anticipated d/c date is: 1 day              Patient currently is not medically stable to d/c.    Procedures/Significant Events:    Consultants:     Cultures 1/17 urine pending  Antimicrobials: Anti-infectives (From admission, onward)    Start     Dose/Rate Route Frequency Ordered Stop   03/27/22 1815  ciprofloxacin (CIPRO) IVPB 400 mg        400 mg 200 mL/hr over 60 Minutes Intravenous Every 12 hours 03/27/22 1728           Assessment & Plan: Covid vaccination;   Principal Problem:   SBO (small bowel obstruction) ([place]) Active Problems:   Hyponatremia   Hypokalemia   Chronic diastolic CHF (congestive heart failure) ([place])   Coronary artery disease involving native coronary artery of native heart without angina pectoris   Essential hypertension   Mixed hyperlipidemia   Follicular lymphoma, unspecified, intra-abdominal lymph nodes (H[facility])   OSA on CPAP   Hypomagnesemia   History of  prostate cancer   Dyslipidemia   Lymphoma (HCC)   Ileus (HCC)  SBO (small bowel obstruction) ([place]) -1/12 confirmed partial small bowel obstruction with 24-hour delay film revealing contrast progression throughout the colon. -Obstruction likely secondary to adhesions due to patient's numerous abdominal surgeries  -1/12 per Dr. Lorenso Ware  oncology felt that patient's intra-abdominal lymphadenopathy is unlikely to be contributing to the patient's obstruction.   -General surgery following daily, -Per patient and wife tolerated lunch.  If patient tolerates dinner will discharge in a.m.  Ileus - 1/18 KUB consistent with ileus -1/18 we will continue soft diet as tolerated - 1/18 Reglan IV 10 mg TID - 1/18 if patient starts complaining of nausea/vomiting will place NG tube to intermittent wall suction. -1/18 D5-0.9% saline 179m/hr   Hyponatremia -1/17 mild asymptomatic.  Most likely secondary to mild dehydration -1/18 see ileus   Chronic diastolic CHF (congestive heart failure) (HCC) -No clinical evidence of cardiogenic volume overload -Strict in and out +5.3 L - Daily weight  Filed Weights   03/20/22 2332 03/27/22  4503 03/28/22 0500  Weight: 90.5 kg 92.9 kg 93.4 kg      CAD involving native coronary artery without angina pectoris -Negative chest pain  -NTG PRN     Essential hypertension - Amlodipine 5 mg daily - Coreg 3.125 mg BID-hydralazine PRN - Losartan 100 mg daily    Mixed hyperlipidemia - Atorvastatin 10 mg Daily   Follicular lymphoma, unspecified, intra-abdominal lymph nodes (HCC) -Follows with Dr. Lorenso Ware oncology in the outpatient setting -No current active treatment -Case discussed with Dr. Lorenso Ware on admission,  -Patient to continue to follow-up as an outpatient at discharge.   OSA on CPAP -Holding off on CPAP due to bowel obstruction -Supplemental oxygen to be provided nightly instead  UTI/Prostatitis -1/18 empirically start ciprofloxacin 400 mg IV BID     Hypokalemia -Potassium goal>4 -1/19 Potassium 51mq -LR+KCL 20 mEq 1050mhr  Hypomagnesemia - Magnesium goal> 2 - 1/19 magnesium IV 4 g      Mobility Assessment (last 72 hours)     Mobility Assessment     Row Name 03/28/22 0821 03/27/22 1943 03/27/22 1100 03/26/22 1957 03/26/22 0930   Does patient have an order for bedrest or is patient medically unstable No - Continue assessment No - Continue assessment No - Continue assessment No - Continue assessment No - Continue assessment   What is the highest level of mobility based on the progressive mobility assessment? Level 5 (Walks with assist in room/hall) - Balance while stepping forward/back and can walk in room with assist - Complete Level 5 (Walks with assist in room/hall) - Balance while stepping forward/back and can walk in room with assist - Complete Level 5 (Walks with assist in room/hall) - Balance while stepping forward/back and can walk in room with assist - Complete Level 5 (Walks with assist in room/hall) - Balance while stepping forward/back and can walk in room with assist - Complete Level 5 (Walks with assist in room/hall) - Balance while stepping forward/back and can walk in room with assist - Complete   Is the above level different from baseline mobility prior to current illness? No - Consider discontinuing PT/OT No - Consider discontinuing PT/OT Yes - Recommend PT order -- --    [place] 03/25/22 2020           Does patient have an order for bedrest or is patient medically unstable No - Continue assessment       What is the highest level of mobility based on the progressive mobility assessment? Level 5 (Walks with assist in room/hall) - Balance while stepping forward/back and can walk in room with assist - Complete                 Time: 50 minutes         Care during the described time interval was provided by me .  I have reviewed this patient's available data, including medical history, events of note,  physical examination, and all test results as part of my evaluation.

## 2022-03-28 NOTE — Progress Notes (Signed)
Progress Note     Subjective: Abdominal pain and bloating improved. Passing flatus and +BM yesterday. No nausea. Eating soft diet well. Ambulating   Objective: Vital signs in last 24 hours: Temp:  [97.6 F (36.4 C)-98 F (36.7 C)] 97.7 F (36.5 C) (01/19 0405) Pulse Rate:  [52-61] 56 (01/19 0405) Resp:  [15-22] 15 (01/19 0405) BP: (118-134)/(62-71) 118/68 (01/19 0405) SpO2:  [92 %-96 %] 93 % (01/19 0405) Weight:  [93.4 kg] 93.4 kg (01/19 0500) Last BM Date : 03/27/22  Intake/Output from previous day: 01/18 0701 - 01/19 0700 In: 2717.9 [P.O.:600; I.V.:2083.9; IV Piggyback:34.1] Out: 1300 [Urine:1300] Intake/Output this shift: Total I/O In: -  Out: 400 [Urine:400]  PE: General: pleasant, WD, WN male who is sitting up in chair in NAD Heart: regular, rate, and rhythm.   Lungs:  Respiratory effort nonlabored Abd: soft, mild TTP suprapubic and RLQ without rebound or guarding, mild distension, +BS,  multiple well healed surgical scars Psych: A&Ox3 with an appropriate affect.    Lab Results:  Recent Labs    03/27/22 0358 03/28/22 0356  WBC 9.8 6.6  HGB 12.0* 10.9*  HCT 36.2* 33.1*  PLT 236 222    BMET Recent Labs    03/27/22 0358 03/28/22 0356  NA 132* 136  K 4.1 3.4*  CL 97* 100  CO2 28 27  GLUCOSE 119* 108*  BUN 19 18  CREATININE 1.09 0.91  CALCIUM 8.1* 7.9*    PT/INR No results for input(s): "LABPROT", "INR" in the last 72 hours. CMP     Component Value Date/Time   NA 136 03/28/2022 0356   NA 140 05/06/2018 1103   K 3.4 (L) 03/28/2022 0356   CL 100 03/28/2022 0356   CO2 27 03/28/2022 0356   GLUCOSE 108 (H) 03/28/2022 0356   BUN 18 03/28/2022 0356   BUN 27 05/06/2018 1103   CREATININE 0.91 03/28/2022 0356   CREATININE 1.10 12/23/2019 1204   CALCIUM 7.9 (L) 03/28/2022 0356   PROT 5.1 (L) 03/28/2022 0356   PROT 6.1 05/06/2018 1103   ALBUMIN 2.3 (L) 03/28/2022 0356   ALBUMIN 3.9 05/06/2018 1103   AST 16 03/28/2022 0356   ALT 11 03/28/2022  0356   ALKPHOS 45 03/28/2022 0356   BILITOT 0.9 03/28/2022 0356   BILITOT 1.2 05/06/2018 1103   GFRNONAA >60 03/28/2022 0356   GFRNONAA 62 12/23/2019 1204   GFRAA 72 12/23/2019 1204   Lipase     Component Value Date/Time   LIPASE 30 03/20/2022 2047       Studies/Results: DG Abd Portable 1V  Result Date: 03/27/2022 CLINICAL DATA:  Small-bowel obstruction. EXAM: PORTABLE ABDOMEN - 1 VIEW COMPARISON:  Radiographs 03/23/2022 and 03/22/2022.  CT 03/20/2022. FINDINGS: 0954 hours. A single supine view of the abdomen is submitted. There is gas throughout the small and large bowel without significant residual bowel distension. Anastomosis clips are noted in the pelvis. There are additional extensive postsurgical changes throughout the left abdomen and pelvis. No supine evidence of free intraperitoneal air. Degenerative changes in the spine associated with a convex left lumbar scoliosis. IMPRESSION: Nonspecific, nonobstructive bowel gas pattern with mildly prominent gas throughout the small and large bowel, likely reflecting an ileus. Extensive postsurgical changes. Electronically Signed   By: Richardean Sale M.D.   On: 03/27/2022 10:02    Anti-infectives: Anti-infectives (From admission, onward)    Start     Dose/Rate Route Frequency Ordered Stop   03/27/22 1815  ciprofloxacin (CIPRO) IVPB 400 mg  400 mg 200 mL/hr over 60 Minutes Intravenous Every 12 hours 03/27/22 1728          Assessment/Plan  Recurrent SBO - last SBO 11/2020, he reports 2-3 total - multiple prior abdominal surgeries  - CT 1/11 shows SBO with suspected transition zone within the pelvis on the left - 24 h delay film shows contrast progression through colon - abd xray yesterday stable - tolerating diet and bowel function improved - BM yesterday - continue to mobilize - recommend keeping K>4.0 and Mg >2.0 to optimize bowel function  - No indication for acute surgical intervention.  Stable for discharge from  surgical perspective. Continue bowel regimen at home (discussed with patient). We will sign off but please do not hesitate to contact us with any questions or concerns or reconsult if needed.   ID - none VTE - lovenox FEN - IVF, soft Foley - none   Follicular lymphoma - follows with Dr. Lorenso Courier, looks like bone mets are new on this CT scan compared to previous 07/2021, he will need outpatient oncology follow up CAD/ hx MI 2017 s/p stent placement Hx embolic CVA secondary to PCI procedure HTN HLD Hx prostate cancer s/p XRT and radiation seed implantation 2010 OSA on CPAP   LOS: 8 days   I reviewed hospitalist notes, last 24 h vitals and pain scores, last 48 h intake and output, last 24 h labs and trends, and last 24 h imaging results.    Winferd Humphrey, [facility] 03/28/2022, 8:33 AM Please see Amion for pager number during day hours 7:00am-4:30pm

## 2022-03-28 NOTE — Discharge Instructions (Signed)
EATING AFTER A SMALL BOWEL OBSTRUCTION   EAT START WITH PUREED OR SOFT FOODS Gradually transition to a high fiber diet with a fiber supplement over the next few days after discharge  WALK Walk an hour a day.  Control your pain to do that.    CONTROL PAIN Control pain so that you can walk, sleep, tolerate sneezing/coughing, go up/down stairs.  HAVE A BOWEL MOVEMENT DAILY Keep your bowels regular to avoid problems.  OK to try a laxative to override constipation.  OK to use an antidairrheal to slow down diarrhea.  Call if not better after 2 tries  CALL IF YOU HAVE PROBLEMS/CONCERNS Call if you are still struggling despite following these instructions. Call if you have concerns not answered by these instructions     After your attack of SMALL BOWEL OBSTRUCTION, expect some issues over the next few weeks.    To help you through this temporary phase, we start you out on a pureed (blenderized) diet.  Your first meal in the hospital was thin liquids.  You should have been given a pureed diet by the time you left the hospital.  We ask patients to stay on a pureed diet for the first few days to avoid anything getting "stuck."  Don't be alarmed if your ability to swallow doesn't progress according to this plan.  Everyone is different and some diets can advance more or less quickly.     Some BASIC RULES to follow are: Maintain an upright position whenever eating or drinking. Take small bites - just a teaspoon size bite at a time. Eat slowly.  It may also help to eat only one food at a time. Consider nibbling through smaller, more frequent meals & avoid the urge to eat BIG meals Do not push through feelings of fullness, nausea, or bloatedness Do not mix solid foods and liquids in the same mouthful Try not to "wash foods down" with large gulps of liquids. Avoid carbonated (bubbly/fizzy) drinks.   Avoid foods that make you feel gassy or bloated.  Start with bland foods first.  Wait on trying  greasy, fried, or spicy meals until you are tolerating more bland solids well. Expect to be more gassy/flatulent/bloated initially.  Walking will help your body manage it better. Consider using medications for bloating that contain simethicone such as  Maalox or Gas-X  Eat in a relaxed atmosphere & minimize distractions. Avoid talking while eating.   Do not use straws. Following each meal, sit in an upright position (90 degree angle) for 60 to 90 minutes.  Going for a short walk can help as well If food does stick, don't panic.  Try to relax and let the food pass on its own.  Sipping WARM LIQUID such as strong hot black tea can also help slide it down.   Be gradual in changes & use common sense:  -If you easily tolerating a certain "level" of foods, advance to the next level gradually -If you are having trouble swallowing a particular food, then avoid it.   -If food is sticking when you advance your diet, go back to thinner previous diet (the lower LEVEL) for 1-2 days.  LEVEL 1 = PUREED DIET  Do for the first Riley in this group are pureed or blenderized to a smooth, mashed potato-like consistency.  -If necessary, the pureed foods can keep their shape with the addition of a thickening agent.   -Meat should be pureed to a  smooth, pasty consistency.  Hot broth or gravy may be added to the pureed meat, approximately 1 oz. of liquid per 3 oz. serving of meat. -CAUTION:  If any foods do not puree into a smooth consistency, swallowing will be more difficult.  (For example, nuts or seeds sometimes do not blend well.)  Hot Foods Cold Foods  Pureed scrambled eggs and cheese Pureed cottage cheese  Baby cereals Thickened juices and nectars  Thinned cooked cereals (no lumps) Thickened milk or eggnog  Pureed [place] toast or pancakes Ensure  Mashed potatoes Ice cream  Pureed parsley, au gratin, scalloped potatoes, candied sweet potatoes Fruit or [place] ice,  sherbet  Pureed buttered or alfredo noodles Plain yogurt  Pureed vegetables (no corn or peas) Instant breakfast  Pureed soups and creamed soups Smooth pudding, mousse, custard  Pureed scalloped apples Whipped gelatin  Gravies Sugar, syrup, honey, jelly  Sauces, cheese, tomato, barbecue, white, creamed Cream  Any baby food Creamer  Alcohol in moderation (not beer or champagne) Margarine  Coffee or tea Mayonnaise   Ketchup, mustard   Apple sauce   SAMPLE MENU:  PUREED DIET Breakfast Lunch Dinner  Orange juice, 1/2 cup Cream of wheat, 1/2 cup Pineapple juice, 1/2 cup Pureed [place], barley soup, 3/4 cup Pureed Hawaiian chicken, 3 oz  Scrambled eggs, mashed or blended with cheese, 1/2 cup Tea or coffee, 1 cup  Whole milk, 1 cup  Non-dairy creamer, 2 Tbsp. Mashed potatoes, 1/2 cup Pureed cooled broccoli, 1/2 cup Apple sauce, 1/2 cup Coffee or tea Mashed potatoes, 1/2 cup Pureed spinach, 1/2 cup Frozen yogurt, 1/2 cup Tea or coffee      LEVEL 2 = SOFT DIET  After your first few days, you can advance to a soft, low residue diet.   Keep on this diet until everything goes down easily.  Hot Foods Cold Foods  White fish Cottage cheese  Stuffed fish Junior baby fruit  Baby food meals Semi thickened juices  Minced soft cooked, scrambled, poached eggs nectars  Souffle & omelets Ripe mashed bananas  Cooked cereals Canned fruit, pineapple sauce, milk  potatoes Milkshake  Buttered or Alfredo noodles Custard  Cooked cooled vegetable Puddings, including tapioca  Sherbet Yogurt  Vegetable soup or alphabet soup Fruit ice, [place] ice  Gravies Whipped gelatin  Sugar, syrup, honey, jelly Junior baby desserts  Sauces:  Cheese, creamed, barbecue, tomato, white Cream  Coffee or tea Margarine   SAMPLE MENU:  LEVEL 2 Breakfast Lunch Dinner  Orange juice, 1/2 cup Oatmeal, 1/2 cup Scrambled eggs with cheese, 1/2 cup Decaffeinated tea, 1 cup Whole milk, 1 cup Non-dairy creamer, 2 Tbsp  Pineapple juice, 1/2 cup Minced beef, 3 oz Gravy, 2 Tbsp Mashed potatoes, 1/2 cup Minced fresh broccoli, 1/2 cup Applesauce, 1/2 cup Coffee, 1 cup [place], barley soup, 3/4 cup Minced Hawaiian chicken, 3 oz Mashed potatoes, 1/2 cup Cooked spinach, 1/2 cup Frozen yogurt, 1/2 cup Non-dairy creamer, 2 Tbsp      LEVEL 3 = CHOPPED DIET  -After all the foods in level 2 (soft diet) are passing through well you should advance up to more chopped foods.  -It is still important to cut these foods into small pieces and eat slowly.  Hot Foods Cold Foods  Poultry Cottage cheese  Chopped Swedish meatballs Yogurt  Meat salads (ground or flaked meat) Milk  Flaked fish (tuna) Milkshakes  Poached or scrambled eggs Soft, cold, dry cereal  Souffles and omelets Fruit juices or nectars  Cooked cereals Chopped canned  fruit  Chopped [place] toast or pancakes Canned fruit cocktail  Noodles or pasta (no rice) Pudding, mousse, custard  Cooked vegetables (no frozen peas, corn, or mixed vegetables) Green salad  Canned small sweet peas Ice cream  Creamed soup or vegetable soup Fruit ice, [place] ice  Pureed vegetable soup or alphabet soup Non-dairy creamer  Ground scalloped apples Margarine  Gravies Mayonnaise  Sauces:  Cheese, creamed, barbecue, tomato, white Ketchup  Coffee or tea Mustard   SAMPLE MENU:  LEVEL 3 Breakfast Lunch Dinner  Orange juice, 1/2 cup Oatmeal, 1/2 cup Scrambled eggs with cheese, 1/2 cup Decaffeinated tea, 1 cup Whole milk, 1 cup Non-dairy creamer, 2 Tbsp Ketchup, 1 Tbsp Margarine, 1 tsp Salt, 1/4 tsp Sugar, 2 tsp Pineapple juice, 1/2 cup Ground beef, 3 oz Gravy, 2 Tbsp Mashed potatoes, 1/2 cup Cooked spinach, 1/2 cup Applesauce, 1/2 cup Decaffeinated coffee Whole milk Non-dairy creamer, 2 Tbsp Margarine, 1 tsp Salt, 1/4 tsp Pureed [place], barley soup, 3/4 cup Barbecue chicken, 3 oz Mashed potatoes, 1/2 cup Ground fresh broccoli, 1/2 cup Frozen yogurt, 1/2  cup Decaffeinated tea, 1 cup Non-dairy creamer, 2 Tbsp Margarine, 1 tsp Salt, 1/4 tsp Sugar, 1 tsp    LEVEL 4:  HIGH FIBER DIET / REGULAR FOODS  -Foods in this group are soft, moist, regularly textured foods.   -This level includes meat and breads, which tend to be the hardest things to swallow.   -Eat very slowly, chew well and continue to avoid carbonated drinks. -most people are at this level in 2-4 weeks  Hot Foods Cold Foods  Baked fish or skinned Soft cheeses - cottage cheese  Souffles and omelets Cream cheese  Eggs Yogurt  Stuffed shells Milk  Spaghetti with meat sauce Milkshakes  Cooked cereal Cold dry cereals (no nuts, dried fruit, coconut)  [place] toast or pancakes Crackers  Buttered toast Fruit juices or nectars  Noodles or pasta (no rice) Canned fruit  Potatoes (all types) Ripe bananas  Soft, cooked vegetables (no corn, lima, or baked beans) Peeled, ripe, fresh fruit  Creamed soups or vegetable soup Cakes (no nuts, dried fruit, coconut)  Canned chicken noodle soup Plain doughnuts  Gravies Ice cream  Bacon dressing Pudding, mousse, custard  Sauces:  Cheese, creamed, barbecue, tomato, white Fruit ice, [place] ice, sherbet  Decaffeinated tea or coffee Whipped gelatin  Pork chops Regular gelatin   Canned fruited gelatin molds   Sugar, syrup, honey, jam, jelly   Cream   Non-dairy   Margarine   Oil   Mayonnaise   Ketchup   Mustard   TROUBLESHOOTING IRREGULAR BOWELS  1) Avoid extremes of bowel movements (no bad constipation/diarrhea)  2) Miralax 17gm mixed in 8oz. water or juice-daily. May use BID as needed.  3) Gas-x,Phazyme, etc. as needed for gas & bloating.  4) Soft,bland diet. No spicy,greasy,fried foods.  5) Prilosec over-the-counter as needed  6) May hold gluten/wheat products from diet to see if symptoms improve.  7) May try probiotics (Align, Activa, etc) to help calm the bowels down  7) If symptoms become worse call back immediately.    If you  have any questions please call our office at [place]: [phone].     This information is not intended to replace advice given to you by your health care provider. Make sure you discuss any questions you have with your health care provider.      Bowel Obstruction A bowel obstruction is a blockage in the small or large bowel. The  bowel, which is also called the intestine, is a long, slender tube that connects the stomach to the anus. When a person eats and drinks, food and fluids go from the mouth to the stomach to the small bowel. This is where most of the nutrients in the food and fluids are absorbed. After the small bowel, material passes through the large bowel for further absorption until any leftover material leaves the body as stool through the anus during a bowel movement. A bowel obstruction will prevent food and fluids from passing through the bowel as they normally do during digestion. The bowel can become partially or completely blocked. If this condition is not treated, it can be dangerous because the bowel could rupture. What are the causes? Common causes of this condition include: Scar tissue (adhesions) from previous surgery or treatment with high-energy X-rays (radiation). Recent surgery. This may cause the movements of the bowel to slow down and cause food to block the intestine. Inflammatory bowel disease, such as Crohn's disease or diverticulitis. Growths or tumors. A bulging organ (hernia). Twisting of the bowel (volvulus). A foreign body. Slipping of a part of the bowel into another part (intussusception). What are the signs or symptoms? Symptoms of this condition include: Pain in the abdomen. Depending on the degree of obstruction, pain may be: Mild or severe. Dull cramping or sharp pain. In one area or in the entire abdomen. Nausea and vomiting. Vomit may be greenish or a yellow bile color. Bloating in the abdomen. Difficulty passing stool  (constipation). Lack of passing gas. Frequent belching. Diarrhea. This may occur if the obstruction is partial and runny stool is able to leak around the obstruction. How is this diagnosed? This condition may be diagnosed based on: A physical exam. Medical history. Imaging tests of the abdomen or pelvis, such as X-ray or CT scan. Blood or urine tests. How is this treated? Treatment for this condition depends on the cause and severity of the problem. Treatment may include: Fluids and pain medicines that are given through an IV. Your health care provider may instruct you not to eat or drink if you have nausea or vomiting. Eating a simple diet. You may be asked to consume a clear liquid diet for several days. This allows the bowel to rest. Placement of a small tube (nasogastric tube) into the stomach. This will relieve pain, discomfort, and nausea by removing blocked air and fluids from the stomach. It can also help the obstruction clear up faster. Surgery. This may be required if other treatments do not work. Surgery may be required for: Bowel obstruction from a hernia. This can be an emergency procedure. Scar tissue that causes frequent or severe obstructions. Follow these instructions at home: Medicines Take over-the-counter and prescription medicines only as told by your health care provider. If you were prescribed an antibiotic medicine, take it as told by your health care provider. Do not stop taking the antibiotic even if you start to feel better. General instructions Follow instructions from your health care provider about eating restrictions. You may need to avoid solid foods and consume only clear liquids until your condition improves. Return to your normal activities as told by your health care provider. Ask your health care provider what activities are safe for you. Avoid sitting for a long time without moving. Get up to take short walks every 1-2 hours. This is important to improve  blood flow and breathing. Ask for help if you feel weak or unsteady. Keep all follow-up visits  as told by your health care provider. This is important. How is this prevented? After having a bowel obstruction, you are more likely to have another. You may do the following things to prevent another obstruction: If you have a long-term (chronic) disease, pay attention to your symptoms and contact your health care provider if you have questions or concerns. Avoid becoming constipated. To prevent or treat constipation, your health care provider may recommend that you: Drink enough fluid to keep your urine pale yellow. Take over-the-counter or prescription medicines. Eat foods that are high in fiber, such as beans, whole grains, and fresh fruits and vegetables. Limit foods that are high in fat and processed sugars, such as fried or sweet foods. Stay active. Exercise for 30 minutes or more, 5 or more days each week. Ask your health care provider which exercises are safe for you. Avoid stress. Find ways to reduce stress, such as meditation, exercise, or taking time for activities that relax you. Instead of eating three large meals each day, eat three small meals with three small snacks. Work with a Microbiologist to make a healthy meal plan that works for you. Do not use any products that contain nicotine or tobacco, such as cigarettes and e-cigarettes. If you need help quitting, ask your health care provider. Contact a health care provider if you: Have a fever. Have chills. Get help right away if you: Have increased pain or cramping. Vomit blood. Have uncontrolled vomiting or nausea. Cannot drink fluids because of vomiting or pain. Become confused. Begin feeling very thirsty (dehydrated). Have severe bloating. Feel extremely weak or you faint. Summary A bowel obstruction is a blockage in the small or large bowel. A bowel obstruction will prevent food and fluids from passing through the bowel as they  normally do during digestion. Treatment for this condition depends on the cause and severity of the problem. It may include fluids and pain medicines through an IV, a simple diet, a nasogastric tube, or surgery. Follow instructions from your health care provider about eating restrictions. You may need to avoid solid foods and consume only clear liquids until your condition improves.

## 2022-03-29 ENCOUNTER — Inpatient Hospital Stay (HOSPITAL_COMMUNITY): Payer: Medicare Other

## 2022-03-29 DIAGNOSIS — K56609 Unspecified intestinal obstruction, unspecified as to partial versus complete obstruction: Secondary | ICD-10-CM | POA: Diagnosis not present

## 2022-03-29 DIAGNOSIS — E871 Hypo-osmolality and hyponatremia: Secondary | ICD-10-CM | POA: Diagnosis not present

## 2022-03-29 DIAGNOSIS — I10 Essential (primary) hypertension: Secondary | ICD-10-CM | POA: Diagnosis not present

## 2022-03-29 DIAGNOSIS — E785 Hyperlipidemia, unspecified: Secondary | ICD-10-CM | POA: Diagnosis not present

## 2022-03-29 LAB — CBC WITH DIFFERENTIAL/PLATELET
Abs Immature Granulocytes: 0.05 10*3/uL (ref 0.00–0.07)
Basophils Absolute: 0 10*3/uL (ref 0.0–0.1)
Basophils Relative: 0 %
Eosinophils Absolute: 0.1 10*3/uL (ref 0.0–0.5)
Eosinophils Relative: 1 %
HCT: 33.3 % — ABNORMAL LOW (ref 39.0–52.0)
Hemoglobin: 10.9 g/dL — ABNORMAL LOW (ref 13.0–17.0)
Immature Granulocytes: 1 %
Lymphocytes Relative: 9 %
Lymphs Abs: 0.8 10*3/uL (ref 0.7–4.0)
MCH: 31.7 pg (ref 26.0–34.0)
MCHC: 32.7 g/dL (ref 30.0–36.0)
MCV: 96.8 fL (ref 80.0–100.0)
Monocytes Absolute: 1.1 10*3/uL — ABNORMAL HIGH (ref 0.1–1.0)
Monocytes Relative: 12 %
Neutro Abs: 7 10*3/uL (ref 1.7–7.7)
Neutrophils Relative %: 77 %
Platelets: 228 10*3/uL (ref 150–400)
RBC: 3.44 MIL/uL — ABNORMAL LOW (ref 4.22–5.81)
RDW: 14.1 % (ref 11.5–15.5)
WBC: 9.1 10*3/uL (ref 4.0–10.5)
nRBC: 0 % (ref 0.0–0.2)

## 2022-03-29 LAB — COMPREHENSIVE METABOLIC PANEL
ALT: 12 U/L (ref 0–44)
AST: 14 U/L — ABNORMAL LOW (ref 15–41)
Albumin: 2.4 g/dL — ABNORMAL LOW (ref 3.5–5.0)
Alkaline Phosphatase: 49 U/L (ref 38–126)
Anion gap: 9 (ref 5–15)
BUN: 14 mg/dL (ref 8–23)
CO2: 25 mmol/L (ref 22–32)
Calcium: 7.6 mg/dL — ABNORMAL LOW (ref 8.9–10.3)
Chloride: 101 mmol/L (ref 98–111)
Creatinine, Ser: 0.89 mg/dL (ref 0.61–1.24)
GFR, Estimated: >60 mL/min (ref >60)
Glucose, Bld: 109 mg/dL — ABNORMAL HIGH (ref 70–99)
Potassium: 3.5 mmol/L (ref 3.5–5.1)
Sodium: 135 mmol/L (ref 135–145)
Total Bilirubin: 0.7 mg/dL (ref 0.3–1.2)
Total Protein: 5.3 g/dL — ABNORMAL LOW (ref 6.5–8.1)

## 2022-03-29 LAB — MAGNESIUM: Magnesium: 2.1 mg/dL (ref 1.7–2.4)

## 2022-03-29 LAB — PHOSPHORUS: Phosphorus: 2.9 mg/dL (ref 2.5–4.6)

## 2022-03-29 MED ORDER — DICYCLOMINE HCL 20 MG PO TABS: 20.0000 mg | ORAL_TABLET | Freq: Three times a day (TID) | ORAL | 0 refills | Status: DC | PRN

## 2022-03-29 MED ORDER — ONDANSETRON HCL 4 MG PO TABS: 4.0000 mg | ORAL_TABLET | Freq: Four times a day (QID) | ORAL | 0 refills | Status: DC | PRN

## 2022-03-29 MED ORDER — TRAMADOL HCL 50 MG PO TABS: 50.0000 mg | ORAL_TABLET | Freq: Four times a day (QID) | ORAL | 0 refills | Status: DC | PRN

## 2022-03-29 MED ORDER — PHENOL 1.4 % MT LIQD: 1.0000 | OROMUCOSAL | 0 refills | Status: DC | PRN

## 2022-03-29 MED ORDER — POLYETHYLENE GLYCOL 3350 17 G PO PACK: 17.0000 g | PACK | Freq: Every day | ORAL | 0 refills | Status: DC

## 2022-03-29 MED ORDER — DICYCLOMINE HCL 10 MG PO CAPS
10.0000 mg | ORAL_CAPSULE | Freq: Four times a day (QID) | ORAL | Status: DC | PRN
  Filled 2022-03-29: qty 1

## 2022-03-29 MED ORDER — NAPHAZOLINE-PHENIRAMINE 0.025-0.3 % OP SOLN: 2.0000 [drp] | Freq: Four times a day (QID) | OPHTHALMIC | 0 refills | Status: DC | PRN

## 2022-03-29 MED ORDER — SENNOSIDES-DOCUSATE SODIUM 8.6-50 MG PO TABS: 1.0000 | ORAL_TABLET | Freq: Two times a day (BID) | ORAL | 0 refills | Status: DC

## 2022-03-29 MED ORDER — POTASSIUM CHLORIDE CRYS ER 20 MEQ PO TBCR
50.0000 meq | EXTENDED_RELEASE_TABLET | Freq: Once | ORAL | Status: AC
  Administered 2022-03-29: 50 meq via ORAL
  Filled 2022-03-29: qty 1

## 2022-03-29 NOTE — Progress Notes (Signed)
AVS reviewed with patient and spouse. Per wife, he already has a walker from church. No other questions or concerns voiced. Family friend provided transportation to disposition.

## 2022-03-29 NOTE — Plan of Care (Signed)
  Problem: Education: Goal: Knowledge of General Education information will improve Description: Including pain rating scale, medication(s)/side effects and non-pharmacologic comfort measures Outcome: Adequate for Discharge   Problem: Clinical Measurements: Goal: Ability to maintain clinical measurements within normal limits will improve Outcome: Adequate for Discharge Goal: Will remain free from infection Outcome: Adequate for Discharge Goal: Diagnostic test results will improve Outcome: Adequate for Discharge Goal: Respiratory complications will improve Outcome: Adequate for Discharge Goal: Cardiovascular complication will be avoided Outcome: Adequate for Discharge   Problem: Health Behavior/Discharge Planning: Goal: Ability to manage health-related needs will improve Outcome: Adequate for Discharge   Problem: Nutrition: Goal: Adequate nutrition will be maintained Outcome: Adequate for Discharge   Problem: Activity: Goal: Risk for activity intolerance will decrease Outcome: Adequate for Discharge   Problem: Coping: Goal: Level of anxiety will decrease Outcome: Adequate for Discharge   Problem: Elimination: Goal: Will not experience complications related to bowel motility Outcome: Adequate for Discharge Goal: Will not experience complications related to urinary retention Outcome: Adequate for Discharge   Problem: Pain Managment: Goal: General experience of comfort will improve Outcome: Adequate for Discharge   Problem: Skin Integrity: Goal: Risk for impaired skin integrity will decrease Outcome: Adequate for Discharge   Problem: Safety: Goal: Ability to remain free from injury will improve Outcome: Adequate for Discharge

## 2022-03-29 NOTE — TOC Transition Note (Signed)
Transition of Care [facility]) - CM/SW Discharge Note  Patient Details  Name: Jose Ware MRN: [id number] Date of Birth: 1934/04/12  Transition of Care [facility]) CM/SW Contact:  Sherie Don, LCSW Phone Number: 03/29/2022, 3:01 PM  Clinical Narrative: Patient will need a rolling walker. CSW made DME referral to [facility] with [place]. [employer] to deliver rolling walker to patient's room. CSW updated RN. TOC signing off.  Final next level of care: Home/Self Care Barriers to Discharge: Barriers Resolved  Discharge Plan and Services Additional resources added to the After Visit Summary for     DME Arranged: Walker rolling DME Agency: [facility] [employer] Date DME Agency Contacted: 03/29/22 Representative spoke with at DME Agency: [place] ([place]) Interventions SDOH Screenings   Food Insecurity: No Food Insecurity (03/20/2022)  Housing: Low Risk  (03/20/2022)  Transportation Needs: No Transportation Needs (03/20/2022)  Utilities: Not At Risk (03/20/2022)  Depression (PHQ2-9): Low Risk  (05/17/2021)  Financial Resource Strain: Low Risk  (05/17/2021)  Physical Activity: Sufficiently Active (05/17/2021)  Social Connections: Socially Integrated (05/17/2021)  Stress: No Stress Concern Present (05/17/2021)  Tobacco Use: Medium Risk (03/20/2022)   Readmission Risk Interventions    03/25/2022    4:07 PM 03/24/2022    3:44 PM  Readmission Risk Prevention Plan  Transportation Screening Complete Complete  PCP or Specialist Appt within 5-7 Days  Complete  Home Care Screening  Complete  Medication Review (RN CM)  Complete

## 2022-03-29 NOTE — Discharge Summary (Signed)
Physician Discharge Summary  Jose Ware:[id number] DOB: 12/15/1934 DOA: 03/20/2022  PCP: [name], MD  Admit date: 03/20/2022 Discharge date: 03/29/2022  Time spent: 35 minutes  Recommendations for Outpatient Follow-up:   SBO (small bowel obstruction) ([place]) -1/12 confirmed partial small bowel obstruction with 24-hour delay film revealing contrast progression throughout the colon. -Obstruction likely secondary to adhesions due to patient's numerous abdominal surgeries  -1/12 per Dr. Lorenso Courier  oncology felt that patient's intra-abdominal lymphadenopathy is unlikely to be contributing to the patient's obstruction.   -General surgery following daily, -Per patient and wife tolerated lunch.  If patient tolerates dinner will discharge in a.m. - 1/20 resolved   Ileus - 1/18 KUB consistent with ileus -1/18 we will continue soft diet as tolerated - 1/18 Reglan IV 10 mg TID - 1/18 if patient starts complaining of nausea/vomiting will place NG tube to intermittent wall suction. -1/18 D5-0.9% saline 149m/hr -1/20 resolved see repeat KUB -Bentyl QID PRN abdominal spasms   Hyponatremia -1/17 mild asymptomatic.  Most likely secondary to mild dehydration -1/18 see ileus -Resolved   Chronic diastolic CHF (congestive heart failure) (HCC) -No clinical evidence of cardiogenic volume overload -Strict in and out +6.1 L - Daily weight  Filed Weights   03/20/22 2332 03/27/22 0344 03/28/22 0500  Weight: 90.5 kg 92.9 kg 93.4 kg   CAD involving native coronary artery without angina pectoris -Negative chest pain  -NTG PRN     Essential hypertension - Amlodipine 5 mg daily - Coreg 3.125 mg BID-hydralazine PRN - Losartan 100 mg daily     Mixed hyperlipidemia - Atorvastatin 10 mg Daily   Follicular lymphoma, unspecified, intra-abdominal lymph nodes ([place] -Follows with Dr. DLorenso Courieroncology in the outpatient setting -No current active treatment -Case discussed with Dr. DLorenso Courier on admission,  -Patient to continue to follow-up as an outpatient at discharge.   OSA on CPAP -Holding off on CPAP due to bowel obstruction -Supplemental oxygen to be provided nightly instead -Restart CPAP at discharge   UTI/Prostatitis -1/18 empirically start ciprofloxacin 400 mg IV BID -1/20 urine culture not consistent with UTI will discontinue antibiotic -1/20 if patient continues to have symptoms would follow-up with his urologist/PCP     Hypokalemia -Potassium goal>4 - 1/20 553m prior to discharge   Hypomagnesemia - Magnesium goal> 2 - At goal   Discharge Diagnoses:  Principal Problem:   SBO (small bowel obstruction) ([facility] Problems:   Hyponatremia   Hypokalemia   Chronic diastolic CHF (congestive heart failure) (HCC)   Coronary artery disease involving native coronary artery of native heart without angina pectoris   Essential hypertension   Mixed hyperlipidemia   Follicular lymphoma, unspecified, intra-abdominal lymph nodes (HCC)   OSA on CPAP   Hypomagnesemia   History of prostate cancer   Dyslipidemia   Lymphoma ([place]  Ileus ([facility]  Discharge Condition: Stable  Diet recommendation: Heart healthy  Filed Weights   03/20/22 2332 03/27/22 0344 03/28/22 0500  Weight: 90.5 kg 92.9 kg 93.4 kg    History of present illness:  [age]ear old wm PMHx follicular lymphoma (follows with Dr. DoLorenso Couriernot on treatment),  severe 3-vessel coronary artery disease (S/P STEMI 10/2015 with DES to LAD and RCA), ischemic cardiomyopathy (Echo 10/2015 45-50% with G1DD, repeat echo 07/2021 unable to get EF), prior embolic stroke after 202376CI, hypertension, hyperlipidemia, prostate cancer (S/P radiation and seed implantation 2010), obstructive sleep apnea on CPAP    [facility] with complaints of right lower  quadrant abdominal pain.   Of note, patient has a history of multiple abdominal surgeries including colostomy and reversal.  Patient  additionally has a history of multiple small bowel obstructions.  Patient reports that he had a colonoscopy approximately 1 week ago with abdominal pain that began thereafter.   Upon evaluation in the emergency department on this presentation, CT imaging of the abdomen and pelvis revealed recurrent small bowel obstruction with suspected transition zone within the left pelvis.  Hospitalist group was contacted and patient was admitted to the hospital.   General surgery was consulted.  NG tube was placed and set to low intermittent suction while patient was kept NPO.  Obstruction was evaluated by a Gastrografin study with small bowel protocol.   In the days that followed patient's abdominal pain, abdominal distention and nausea gradually improved.  Eventually, NG tube was removed and patient was able to tolerate a clear liquid diet which was advanced as tolerated  Hospital Course:  See above  Procedures: 1/20 KUB: Negative for SBO/ileus  Consultations: CCS  Cultures  1/17 urine insignificant growth  Antibiotics Anti-infectives (From admission, onward)    Start     Ordered Stop   03/27/22 1815  ciprofloxacin (CIPRO) IVPB 400 mg  Status:  Discontinued        03/27/22 1728 03/29/22 1316           Discharge Exam: Vitals:   03/28/22 1353 03/28/22 2103 03/29/22 0517 03/29/22 1228  BP: 126/67 139/70 (!) 153/69 134/66  Pulse: (!) 57 61 61 (!) 56  Resp: '15 18 17 15  '$ Temp: 97.6 F (36.4 C) 97.6 F (36.4 C) 97.8 F (36.6 C) 98.8 F (37.1 C)  TempSrc:      SpO2: 96% 96% 92% 95%  Weight:      Height:        General: A/O x 4 No acute respiratory distress Eyes: negative scleral hemorrhage, negative anisocoria, negative icterus ENT: Negative Runny nose, negative gingival bleeding, Neck:  Negative scars, masses, torticollis, lymphadenopathy, JVD Lungs: Clear to auscultation bilaterally without wheezes or crackles Cardiovascular: Regular rate and rhythm without murmur gallop or rub  normal S1 and S2  Discharge Instructions   Allergies as of 03/29/2022       Reactions   Bee Venom Swelling   Facial swelling   Betadine [povidone Iodine] Other (See Comments)   blistering   Doxycycline    Possible drug rash- back of right lower leg and onto left lower leg as well        Medication List     STOP taking these medications    spironolactone 25 MG tablet Commonly known as: ALDACTONE       TAKE these medications    amLODipine 5 MG tablet Commonly known as: NORVASC Take 1 tablet by mouth once daily   aspirin EC 81 MG tablet Take 1 tablet (81 mg total) by mouth daily.   atorvastatin 10 MG tablet Commonly known as: LIPITOR Take 1 tablet (10 mg total) by mouth every other day.   carvedilol 3.125 MG tablet Commonly known as: COREG Take 1 tablet by mouth twice daily   dicyclomine 20 MG tablet Commonly known as: BENTYL Take 1 tablet (20 mg total) by mouth 3 (three) times daily as needed for spasms. What changed: when to take this   losartan 100 MG tablet Commonly known as: COZAAR Take 1 tablet by mouth once daily   naphazoline-pheniramine 0.025-0.3 % ophthalmic solution Commonly known as: NAPHCON-A Place 2 drops  into both eyes 4 (four) times daily as needed for eye irritation or allergies.   nitroGLYCERIN 0.4 MG SL tablet Commonly known as: NITROSTAT Place 1 tablet (0.4 mg total) under the tongue every 5 (five) minutes x 3 doses as needed for chest pain.   OCUVITE ADULT 50+ PO Take 1 tablet by mouth daily.   ondansetron 4 MG tablet Commonly known as: ZOFRAN Take 1 tablet (4 mg total) by mouth every 6 (six) hours as needed for nausea.   phenol 1.4 % Liqd Commonly known as: CHLORASEPTIC Use as directed 1 spray in the mouth or throat as needed for throat irritation / pain.   polyethylene glycol 17 g packet Commonly known as: MIRALAX / GLYCOLAX Take 17 g by mouth daily. Start taking on: March 30, 2022   PreviDent 5000 Dry Mouth 1.1 % Gel  dental gel Generic drug: sodium fluoride Place 1 Application onto teeth at bedtime.   senna-docusate 8.6-50 MG tablet Commonly known as: Senokot-S Take 1 tablet by mouth 2 (two) times daily.   traMADol 50 MG tablet Commonly known as: ULTRAM Take 1 tablet (50 mg total) by mouth every 6 (six) hours as needed for moderate pain or severe pain.   Vitamin B-12 5000 MCG Tbdp Take 5,000 mcg by mouth every morning.               Durable Medical Equipment  (From admission, onward)           Start     Ordered   03/29/22 1440  For home use only DME Walker rolling  Once       Question Answer Comment  Walker: With 5 Inch Wheels   Patient needs a walker to treat with the following condition Ileus ([place])   Patient needs a walker to treat with the following condition Physical deconditioning      03/29/22 1439           Allergies  Allergen Reactions   Bee Venom Swelling    Facial swelling   Betadine [Povidone Iodine] Other (See Comments)    blistering   Doxycycline     Possible drug rash- back of right lower leg and onto left lower leg as well      The results of significant diagnostics from this hospitalization (including imaging, microbiology, ancillary and laboratory) are listed below for reference.    Significant Diagnostic Studies: DG Abd 1 View  Result Date: 03/29/2022 CLINICAL DATA:  Generalized abdominal discomfort, no bowel movement today or yesterday, multiple prior abdominal surgeries including colostomy with reversal EXAM: ABDOMEN - 1 VIEW COMPARISON:  03/27/2022 FINDINGS: Scattered surgical clips in abdomen and pelvis from colon surgery. Brachytherapy seed implants at prostate. Nonobstructive bowel gas pattern. No bowel dilatation or bowel wall thickening. Bones demineralized with levoconvex lumbar scoliosis. No urinary tract calcification. IMPRESSION: Nonobstructive bowel gas pattern. Electronically Signed   By: Lavonia Dana M.D.   On: 03/29/2022 14:58   DG  Abd Portable 1V  Result Date: 03/27/2022 CLINICAL DATA:  Small-bowel obstruction. EXAM: PORTABLE ABDOMEN - 1 VIEW COMPARISON:  Radiographs 03/23/2022 and 03/22/2022.  CT 03/20/2022. FINDINGS: 0954 hours. A single supine view of the abdomen is submitted. There is gas throughout the small and large bowel without significant residual bowel distension. Anastomosis clips are noted in the pelvis. There are additional extensive postsurgical changes throughout the left abdomen and pelvis. No supine evidence of free intraperitoneal air. Degenerative changes in the spine associated with a convex left lumbar scoliosis. IMPRESSION: Nonspecific,  nonobstructive bowel gas pattern with mildly prominent gas throughout the small and large bowel, likely reflecting an ileus. Extensive postsurgical changes. Electronically Signed   By: Richardean Sale M.D.   On: 03/27/2022 10:02   DG Abd Portable 1V-Small Bowel Obstruction Protocol-24 hr delay  Result Date: 03/23/2022 CLINICAL DATA:  Evaluate transit of contrast. EXAM: PORTABLE ABDOMEN - 1 VIEW COMPARISON:  One-view abdomen 03/22/2022 at 4:28 p.m. and one-view abdomen 03/22/2022 at 3:50 a.m. FINDINGS: Contrast previously seen predominantly within the distal small bowel and ascending colon is now distributed more diffusely throughout the colon. Some contrast is again noted within the rectum. A focal area of contrast in the right upper abdomen within small bowel is less prominent than on the prior exam. This likely represents contrast within a duodenal diverticulum. The NG tube is coiled in the stomach. IMPRESSION: Contrast previously seen predominantly within the distal small bowel and ascending colon is now distributed more diffusely throughout the colon. Electronically Signed   By: San Morelle M.D.   On: 03/23/2022 10:36   DG Abd Portable 1V-Small Bowel Obstruction Protocol-24 hr delay  Result Date: 03/22/2022 CLINICAL DATA:  NG tube placement EXAM: PORTABLE ABDOMEN -  1 VIEW COMPARISON:  03/22/2022 FINDINGS: NG tube in the fundus of the stomach. IMPRESSION: NG tube in the fundus of the stomach. Electronically Signed   By: Rolm Baptise M.D.   On: 03/22/2022 17:12   DG Abd 1 View  Result Date: 03/22/2022 CLINICAL DATA:  [id number] with insertion of NG tube documentation. EXAM: ABDOMEN - 1 VIEW COMPARISON:  Portable exam earlier today at 12:26 a.m. FINDINGS: 3:58 a.m. NGT curves to the left over the mid body and fundus of the stomach with tip in the area of the fundal/cardial junction. There is contrast in the gastric fundus and in the right upper abdomen, with very dilute contrast in the more distal bowel. Generalized small bowel dilatation up to 5.1 cm appears similar. Colonic gas remains present through into the rectum consistent with a partial SBO. Postsurgical changes are again noted in the left upper abdomen, pelvis, and with evidence of prostate brachytherapy. IMPRESSION: 1. NGT placement as described above. 2. Persistent small-bowel dilatation up to 5.1 cm. Electronically Signed   By: Telford Nab M.D.   On: 03/22/2022 05:16   DG Abd Portable 1V-Small Bowel Obstruction Protocol-initial, 8 hr delay  Result Date: 03/22/2022 CLINICAL DATA:  Follow up small bowel obstruction EXAM: PORTABLE ABDOMEN - 1 VIEW COMPARISON:  Film from the previous day. FINDINGS: Check catheter is again noted within the stomach. Administered contrast lies within the stomach as well as passed distally into the colon. There remains small bowel dilatation. These changes are consistent with a partial small bowel obstruction. IMPRESSION: Partial small bowel obstruction as contrast is noted within the colon Electronically Signed   By: Inez Catalina M.D.   On: 03/22/2022 00:39   DG Abd 1 View  Result Date: 03/21/2022 CLINICAL DATA:  Encounter for NG tube placement. EXAM: ABDOMEN - 1 VIEW COMPARISON:  One-view abdomen 03/21/2022 at 12:10 p.m. FINDINGS: The NG tube is been advanced. The side port is  in the fundus the stomach. Gas-filled mildly dilated loops of large and small bowel are similar the prior exam. IMPRESSION: Interval advancement of NG tube with the side port in the fundus of the stomach. Electronically Signed   By: San Morelle M.D.   On: 03/21/2022 15:21   DG Abd Portable 1V-Small Bowel Protocol-Position Verification  Result Date: 03/21/2022 CLINICAL  DATA:  Nasogastric tube placement EXAM: PORTABLE ABDOMEN - 1 VIEW COMPARISON:  03/21/2022 at 11:01 a.m. FINDINGS: The nasogastric tube has been slightly advanced such that its tip projects over the gastric cardia, with the side port in the distal esophagus. However, the tube is only about 3.3 cm further distal compared to the prior examination. Continued gas-filled mildly dilated small bowel loops noted. IMPRESSION: 1. The nasogastric tube has been slightly advanced such that its tip projects over the gastric cardia, with the side port in the distal esophagus. However, the tube is only about 3.3 cm further distal compared to the prior examination. Electronically Signed   By: Van Clines M.D.   On: 03/21/2022 13:30   DG Abd 1 View  Result Date: 03/21/2022 CLINICAL DATA:  Enteric tube placement. EXAM: ABDOMEN - 1 VIEW COMPARISON:  CT abdomen pelvis from yesterday. FINDINGS: Enteric tube tip seen at the gastroesophageal junction. Multiple mildly dilated loops of small bowel in the central abdomen are unchanged. Air and stool noted in the colon. Postsurgical changes in the left upper quadrant and central pelvis. Prior cholecystectomy. No acute osseous abnormality. IMPRESSION: 1. Enteric tube tip at the gastroesophageal junction. Recommend advancement. 2. Unchanged small bowel obstruction versus ileus. Electronically Signed   By: Titus Dubin M.D.   On: 03/21/2022 11:17   CT ABDOMEN PELVIS WO CONTRAST  Result Date: 03/20/2022 CLINICAL DATA:  Status post recent colonoscopy with subsequent worsening lower abdominal pain. EXAM:  CT ABDOMEN AND PELVIS WITHOUT CONTRAST TECHNIQUE: Multidetector CT imaging of the abdomen and pelvis was performed following the standard protocol without IV contrast. RADIATION DOSE REDUCTION: This exam was performed according to the departmental dose-optimization program which includes automated exposure control, adjustment of the mA and/or kV according to patient size and/or use of iterative reconstruction technique. COMPARISON:  March 19, 2022 (10:49 a.m.) FINDINGS: Lower chest: No acute abnormality. Hepatobiliary: No focal liver abnormality is seen. Status post cholecystectomy. No biliary dilatation. Pancreas: Unremarkable. No pancreatic ductal dilatation or surrounding inflammatory changes. Spleen: Normal in size without focal abnormality. Adrenals/Urinary Tract: Stable diffuse bilateral adrenal gland enlargement is noted. Kidneys are normal in size, without renal calculi or hydronephrosis. Stable bilateral renal cysts are seen. Bladder is unremarkable. Stomach/Bowel: Stomach is within normal limits. Appendix appears normal. Surgically anastomosed bowel is seen within the mid sigmoid colon. Numerous surgical clips are seen scattered throughout the abdomen. Multiple dilated small bowel loops are seen throughout the mid to lower abdomen and pelvis (maximum small bowel diameter of approximately 3.9 cm). A transition zone is suspected within the pelvis on the left. Vascular/Lymphatic: Aortic atherosclerosis. Stable, enlarged para-aortic lymph nodes are seen (the largest measures 4.4 cm x 2.8 cm). A stable 2.2 cm x 3.2 cm aortocaval lymph node is also noted. Reproductive: Radiotherapy implants are seen within the prostate gland. Other: No abdominal wall hernia or abnormality. No abdominopelvic ascites or intra abdominal free air. Musculoskeletal: Multilevel marked severity degenerative changes are seen throughout the lumbar spine. Multiple subcortical lucencies are also seen involving multiple lumbar spine  vertebral bodies. IMPRESSION: 1. Small-bowel obstruction with a suspected transition zone within the pelvis on the left. 2. Stable retroperitoneal lymphadenopathy consistent with the patient's history of lymphoma. 3. Stable bilateral renal cysts. No follow-up imaging is recommended. This recommendation follows ACR consensus guidelines: Management of the Incidental Renal Mass on CT: A White Paper of the ACR Incidental Findings Committee. J Am Coll Radiol [phone]. 4. Evidence of prior cholecystectomy and prior sigmoid colon resection. 5. Multiple lytic  lesions involving multiple lumbar spine vertebral bodies, consistent with osseous metastasis. 6. Aortic atherosclerosis. 7. Stable diffuse bilateral adrenal gland enlargement which may represent adrenal hyperplasia. Correlation with adrenal function tests is recommended. Aortic Atherosclerosis (ICD10-I70.0). Electronically Signed   By: Virgina Norfolk M.D.   On: 03/20/2022 21:41   CT ABDOMEN PELVIS W CONTRAST  Result Date: 03/19/2022 CLINICAL DATA:  Right lower quadrant abdominal pain. EXAM: CT ABDOMEN AND PELVIS WITH CONTRAST TECHNIQUE: Multidetector CT imaging of the abdomen and pelvis was performed using the standard protocol following bolus administration of intravenous contrast. RADIATION DOSE REDUCTION: This exam was performed according to the departmental dose-optimization program which includes automated exposure control, adjustment of the mA and/or kV according to patient size and/or use of iterative reconstruction technique. CONTRAST:  116m OMNIPAQUE IOHEXOL 300 MG/ML  SOLN COMPARISON:  CT of the abdomen and pelvis with contrast 07/15/2021. FINDINGS: Lower chest: Mild dependent atelectasis is present. Lungs are otherwise clear. Heart size is upper limits of normal. Coronary artery calcifications are present. Hepatobiliary: Mild intra and extrahepatic biliary dilation has progressed no focal obstruction is present. There may be some mass effect  from the 4 cm duodenal diverticulum. No inflammatory changes are associated. Pancreas: Unremarkable. No pancreatic ductal dilatation or surrounding inflammatory changes. Spleen: Normal in size without focal abnormality. Adrenals/Urinary Tract: Adrenal glands are within normal limits bilaterally. Bilateral simple cysts of both kidneys scratched at bilateral simple cysts are stable. No follow-up imaging is recommended. No solid lesions are present. No stone or obstruction is present. Ureters are within normal limits bilaterally. The urinary bladder is unremarkable. Stomach/Bowel: The stomach is otherwise within normal limits. The duodenal diverticulum is stable. Proximal small bowel is within normal limits. Small lymph nodes in some edema are present throughout the mesentery. Fluid levels are present within nondilated loops of bowel. Loops are present in the right lower quadrant adjacent to the peritoneum with some wall thickening and surrounding inflammatory changes. No obstruction is present. Terminal ileum is within normal limits. Appendix is not discretely visualized and may be surgically absent. The ascending and transverse colon are within normal limits. Surgical clips are noted in the left upper quadrant. Descending colon is normal. Sigmoid anastomosis is intact. Sigmoid colon is within normal limits. Vascular/Lymphatic: Atherosclerotic calcifications are present in the aorta and branch vessels without aneurysm. Enlarged retroperitoneal lymph nodes are stable. Previously measured left periaortic nodal cluster is stable measuring 4.6 x 3.6 cm on image 34 of series 2. a right periaortic node on image 35 of series 2 is also stable. No new or enlarging nodes are present. Reproductive: Brachy therapy CT noted in the prostate gland. Seminal vesicles are within normal limits. Other: No significant ventral hernias are present. No free fluid or free air is present. Previous anterior abdominal scars are noted.  Musculoskeletal: Lucent lesion at L2 is stable and chronic. Subcortical lucencies at L3 and L4 likely degenerative. These are also chronic. Remote superior endplate fracture of L1 is noted. No new lesions are present. Degenerative changes are noted at the ischial tuberosities bilaterally. Degenerative changes of the hips are stable IMPRESSION: 1. Fluid levels within nondilated loops of bowel with some wall thickening and surrounding inflammatory changes in the right lower quadrant adjacent to the peritoneum. This is concerning for a nonspecific enteritis. Adhesions may contribute. No obstruction is present. 2. Enlarged retroperitoneal lymph nodes are stable, consistent with the history of lymphoma. No evidence for disease progression. 3. Stable 4 cm duodenal diverticulum. 4. Coronary artery disease. 5. Mild  intra and extrahepatic biliary dilation has progressed no focal obstruction is present. There may be some mass effect from the 4 cm duodenal diverticulum. No inflammatory changes are associated. 6.  Aortic Atherosclerosis (ICD10-I70.0). Electronically Signed   By: San Morelle M.D.   On: 03/19/2022 11:14   DG Abdomen 1 View  Result Date: 03/19/2022 CLINICAL DATA:  Concern for bowel obstruction. Lower abdominal pain. EXAM: ABDOMEN - 1 VIEW COMPARISON:  CT abdomen and pelvis 07/15/2021 FINDINGS: Scattered bowel gas throughout the abdomen and pelvis. There is gas within the small and large bowel. Nonobstructive bowel gas pattern. Bowel postsurgical changes in the pelvis and brachytherapy seeds in the prostate. Mild levoscoliosis in lumbar spine with multilevel degenerative changes in lumbar spine. Surgical clips in the upper abdomen. Chronic calcification in the medial right abdomen. IMPRESSION: 1. Nonobstructive bowel gas pattern. 2. Postsurgical changes in the abdomen and pelvis. Electronically Signed   By: Markus Daft M.D.   On: 03/19/2022 09:50    Microbiology: Recent Results (from the past 240  hour(s))  Urine Culture     Status: Abnormal   Collection Time: 03/26/22  1:52 AM   Specimen: Urine, Clean Catch  Result Value Ref Range Status   Specimen Description   Final    URINE, CLEAN CATCH Performed at [facility], [place] [address] [place], [place] [address]    Special Requests   Final    NONE Performed at [facility], [place] [address] [place], [place] [address]    Culture (A)  Final    <10,000 COLONIES/mL INSIGNIFICANT GROWTH Performed at [facility] [address], [place], [place] [address]    Report Status 03/28/2022 FINAL  Final     Labs: Basic Metabolic Panel: Recent Labs  Lab 03/24/22 0404 03/25/22 0401 03/27/22 0358 03/28/22 0356 03/29/22 0445  NA 135 133* 132* 136 135  K 4.1 4.0 4.1 3.4* 3.5  CL 100 97* 97* 100 101  CO2 '27 28 28 27 25  '$ GLUCOSE 70 101* 119* 108* 109*  BUN 27* '21 19 18 14  '$ CREATININE 1.12 1.07 1.09 0.91 0.89  CALCIUM 8.1* 8.0* 8.1* 7.9* 7.6*  MG 2.1 1.8 1.7 1.6* 2.1  PHOS  --   --  3.5 3.4 2.9   Liver Function Tests: Recent Labs  Lab 03/24/22 0404 03/25/22 0401 03/27/22 0358 03/28/22 0356 03/29/22 0445  AST 11* 12* 17 16 14*  ALT '9 9 12 11 12  '$ ALKPHOS 55 52 49 45 49  BILITOT 2.0* 1.6* 1.4* 0.9 0.7  PROT 5.1* 5.3* 5.4* 5.1* 5.3*  ALBUMIN 2.7* 2.8* 2.6* 2.3* 2.4*   No results for input(s): "LIPASE", "AMYLASE" in the last 168 hours. No results for input(s): "AMMONIA" in the last 168 hours. CBC: Recent Labs  Lab 03/24/22 0404 03/25/22 0401 03/27/22 0358 03/28/22 0356 03/29/22 0445  WBC 8.7 8.3 9.8 6.6 9.1  NEUTROABS 6.5 6.0 7.6 4.8 7.0  HGB 12.6* 11.5* 12.0* 10.9* 10.9*  HCT 37.7* 34.9* 36.2* 33.1* 33.3*  MCV 98.7 97.5 97.1 96.5 96.8  PLT 226 212 236 222 228   Cardiac Enzymes: No results for input(s): "CKTOTAL", "CKMB", "CKMBINDEX", "TROPONINI" in the last 168 hours. BNP: BNP (last 3 results) No results for input(s): "BNP" in the last 8760 hours.  ProBNP (last 3  results) No results for input(s): "PROBNP" in the last 8760 hours.  CBG: No results for input(s): "GLUCAP" in the last 168 hours.     Signed:  Dia Crawford,  MD [facility]

## 2022-03-31 ENCOUNTER — Telehealth: Payer: Self-pay

## 2022-03-31 ENCOUNTER — Telehealth: Payer: Self-pay | Admitting: *Deleted

## 2022-03-31 NOTE — Patient Outreach (Signed)
  Care Coordination TOC Note Transition Care Management Unsuccessful Follow-up Telephone Call  Date of discharge and from where:  03/29/22-[facility]   Attempts:  1st Attempt  Reason for unsuccessful TCM follow-up call:  Unable to leave message    Enzo Montgomery, RN,BSN,CCM [place] Management Telephonic Care Management Coordinator Direct Phone: [phone] Toll Free: [phone] Fax: [phone]

## 2022-03-31 NOTE — Progress Notes (Signed)
  Care Coordination  Note  03/31/2022 Name: DOC MANDALA MRN: [id number] DOB: [birth year]  [name] is a 88 y.o. year old primary care patient of Yong Channel, Brayton Mars, MD. I reached out to [name] by phone today to assist with scheduling a follow up appointment. [name] verbally consented to my assistance.       Follow up plan: Hospital Follow Up appointment scheduled with (Dr Yong Channel) on (04/11/2022) at (920am).  Julian Hy, [place] Direct Dial: [phone]

## 2022-03-31 NOTE — Patient Outreach (Signed)
  Care Coordination TOC Note Transition Care Management Follow-up Telephone Call Date of discharge and from where: 03/29/22-[facility]  Dx: "SBO" How have you been since you were released from the hospital? Patient states he is "doing good." Denies any acute issues. He has been up moving around with use of walker. Appetite has been fair. He has had a BM since returning home. Denies any GI issues. Any questions or concerns? Yes-Patient concerned that he was taken off Aldactone which cardiologist had prescribed for him prior to hospitalization. Patient wanting to discuss the rationale for this and talk with MD about rather or not he should really not be taking this as he voices no cardiologist saw him while in the hospital. Patient states he will call his cardiologist to discuss further. He denies any current edema/swelling,SOB or other sxs.  Items Reviewed: Did the pt receive and understand the discharge instructions provided? Yes  Medications obtained and verified? Yes  Other? No  Any new allergies since your discharge? No  Dietary orders reviewed? Yes Do you have support at home? Yes   Home Care and Equipment/Supplies: Were home health services ordered? not applicable If so, what is the name of the agency? N/A  Has the agency set up a time to come to the patient's home? not applicable Were any new equipment or medical supplies ordered?  Yes: walker What is the name of the medical supply agency? [employer] Were you able to get the supplies/equipment? yes Do you have any questions related to the use of the equipment or supplies? No  Functional Questionnaire: (I = Independent and D = Dependent) ADLs: I  Bathing/Dressing- I  Meal Prep- A  Eating- I  Maintaining continence- I  Transferring/Ambulation- I  Managing Meds- I  Follow up appointments reviewed:  PCP Hospital f/u appt confirmed? No  . [facility] f/u appt confirmed? Yes  Scheduled to see Dr. Martinique on 04/14/22  @ 1:20 pm. Are transportation arrangements needed? No  If their condition worsens, is the pt aware to call PCP or go to the Emergency Dept.? Yes Was the patient provided with contact information for the PCP's office or ED? Yes Was to pt encouraged to call back with questions or concerns? Yes  SDOH assessments and interventions completed:   Yes SDOH Interventions Today    Flowsheet Row Most Recent Value  SDOH Interventions   Food Insecurity Interventions Intervention Not Indicated  Transportation Interventions Intervention Not Indicated       Care Coordination Interventions:  PCP follow up appointment requested Education provided    Encounter Outcome:  Pt. Visit Completed    Enzo Montgomery, RN,BSN,CCM [place] Management Telephonic Care Management Coordinator Direct Phone: [phone] Toll Free: [phone] Fax: [phone]

## 2022-04-04 ENCOUNTER — Encounter: Payer: Self-pay | Admitting: Family Medicine

## 2022-04-04 ENCOUNTER — Ambulatory Visit (INDEPENDENT_AMBULATORY_CARE_PROVIDER_SITE_OTHER): Payer: Medicare Other | Admitting: Family Medicine

## 2022-04-04 VITALS — BP 110/70 | HR 55 | Temp 98.4°F | Ht 73.0 in | Wt 193.0 lb

## 2022-04-04 DIAGNOSIS — R531 Weakness: Secondary | ICD-10-CM

## 2022-04-04 DIAGNOSIS — Z8719 Personal history of other diseases of the digestive system: Secondary | ICD-10-CM

## 2022-04-04 DIAGNOSIS — D692 Other nonthrombocytopenic purpura: Secondary | ICD-10-CM | POA: Diagnosis not present

## 2022-04-04 DIAGNOSIS — I10 Essential (primary) hypertension: Secondary | ICD-10-CM

## 2022-04-04 DIAGNOSIS — I2510 Atherosclerotic heart disease of native coronary artery without angina pectoris: Secondary | ICD-10-CM

## 2022-04-04 DIAGNOSIS — E785 Hyperlipidemia, unspecified: Secondary | ICD-10-CM

## 2022-04-04 LAB — CBC WITH DIFFERENTIAL/PLATELET
Basophils Absolute: 0 10*3/uL (ref 0.0–0.1)
Basophils Relative: 0.6 % (ref 0.0–3.0)
Eosinophils Absolute: 0.1 10*3/uL (ref 0.0–0.7)
Eosinophils Relative: 1.3 % (ref 0.0–5.0)
HCT: 35.7 % — ABNORMAL LOW (ref 39.0–52.0)
Hemoglobin: 12.1 g/dL — ABNORMAL LOW (ref 13.0–17.0)
Lymphocytes Relative: 13.9 % (ref 12.0–46.0)
Lymphs Abs: 1.2 10*3/uL (ref 0.7–4.0)
MCHC: 33.8 g/dL (ref 30.0–36.0)
MCV: 96.4 fl (ref 78.0–100.0)
Monocytes Absolute: 1 10*3/uL (ref 0.1–1.0)
Monocytes Relative: 11.9 % (ref 3.0–12.0)
Neutro Abs: 6.2 10*3/uL (ref 1.4–7.7)
Neutrophils Relative %: 72.3 % (ref 43.0–77.0)
Platelets: 374 10*3/uL (ref 150.0–400.0)
RBC: 3.7 Mil/uL — ABNORMAL LOW (ref 4.22–5.81)
RDW: 14.1 % (ref 11.5–15.5)
WBC: 8.6 10*3/uL (ref 4.0–10.5)

## 2022-04-04 LAB — COMPREHENSIVE METABOLIC PANEL
ALT: 14 U/L (ref 0–53)
AST: 14 U/L (ref 0–37)
Albumin: 3.4 g/dL — ABNORMAL LOW (ref 3.5–5.2)
Alkaline Phosphatase: 58 U/L (ref 39–117)
BUN: 16 mg/dL (ref 6–23)
CO2: 29 mEq/L (ref 19–32)
Calcium: 8.9 mg/dL (ref 8.4–10.5)
Chloride: 99 mEq/L (ref 96–112)
Creatinine, Ser: 0.98 mg/dL (ref 0.40–1.50)
GFR: 70.14 mL/min (ref >60.00)
Glucose, Bld: 86 mg/dL (ref 70–99)
Potassium: 4.2 mEq/L (ref 3.5–5.1)
Sodium: 137 mEq/L (ref 135–145)
Total Bilirubin: 1.1 mg/dL (ref 0.2–1.2)
Total Protein: 5.9 g/dL — ABNORMAL LOW (ref 6.0–8.3)
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
## 2022-04-04 NOTE — Patient Instructions (Addendum)
Continue spironolactone for now- we will update labs  Lets decrease senokot to once a day in evening and reduce miralax to half capful in morning- still want you to have a a daily bowel movement ideally- so we can adjust up again if needed.   We will call you within two weeks about your referral to home health. If you do not hear within 2 weeks, give [place] a call.   I think meeting with a dietician to help balance diet now and moving forward is reasonable- unfortunately insurance doesn't typically cover well- reasonable to do a cash pay visit with someone  Please stop by lab before you go If you have mychart- we will send your results within 3 business days of [place] receiving them.  If you do not have mychart- we will call you about results within 5 business days of [place] receiving them.  *please also note that you will see labs on mychart as soon as they post. I will later go in and write notes on them- will say "notes from Dr. Yong Channel"   Recommended follow up: Return for next already scheduled visit or sooner if needed.

## 2022-04-08 NOTE — Progress Notes (Signed)
04/14/2022 KAHIL AGNER   April 28, 1934  [id number]  Primary Physician Yong Channel Brayton Mars, MD Primary Cardiologist: Dr Martinique  HPI:  Mr. Sainsbury is seen for follow up CAD. He has a history of HTN, remote tobacco abuse, psoriasis, prostate cancer s/p XRT and radiation seed therapy, adenomatous colon polyps and diverticulitis s/p previous abdominal surgeries.   He presented to [facility] on 11/04/15 as a inferolateral STEMI. Emergent cath revealed a very large LCx with extensive thrombus. He underwent PCI with DES. With reperfusion he had NSVT. The pt also had residual RCA and LAD disease and an EF of 25% at cath. Echo done 11/05/15 showed an EF of 40-45%. His Troponin peaked at 25. On 11/06/15 he was taken back to the lab for staged PCI to his LAD and RCA. This was successful but on 11/07/15 the pt related that he developed some word finding issues that started post PCI 8/29. MRI revealed scattered small acute infarcts in the bilateral anterior and posterior circulation.  CA dopplers revealed mild plaque (1-39%). The pt's symptoms improved.   He was admitted in September 2022 with partial SBO and cellulitis of upper arm. CT showed retroperitoneal adenopathy. PSA was normal. Repeat CT by oncology showed persistent adenopathy. Biopsy was positive for follicular lymphoma. This was felt to be low grade and is being followed closely by Dr Lorenso Courier.    He was recently admitted 1/11-1/20/24 with partial small bowel obstruction treated medically. On follow up he is still weak but improving. He denies any chest pain, dyspnea or palpitations. No edema. Tolerating meds well. Bowels are OK.   Current Outpatient Medications  Medication Sig Dispense Refill   amLODipine (NORVASC) 5 MG tablet Take 1 tablet by mouth once daily (Patient taking differently: Take 5 mg by mouth daily.) 90 tablet 0   aspirin EC 81 MG tablet Take 1 tablet (81 mg total) by mouth daily. 90 tablet 1   atorvastatin (LIPITOR) 10 MG tablet Take 1  tablet (10 mg total) by mouth every other day. 43 tablet 3   carvedilol (COREG) 3.125 MG tablet Take 1 tablet by mouth twice daily (Patient taking differently: Take 3.125 mg by mouth 2 (two) times daily.) 180 tablet 3   Cyanocobalamin (VITAMIN B-12) 5000 MCG TBDP Take 5,000 mcg by mouth every morning.     dicyclomine (BENTYL) 20 MG tablet Take 1 tablet (20 mg total) by mouth 3 (three) times daily as needed for spasms. 60 tablet 0   losartan (COZAAR) 100 MG tablet Take 1 tablet by mouth once daily (Patient taking differently: Take 100 mg by mouth daily.) 90 tablet 3   Multiple Vitamins-Minerals (OCUVITE ADULT 50+ PO) Take 1 tablet by mouth daily.     naphazoline-pheniramine (NAPHCON-A) 0.025-0.3 % ophthalmic solution Place 2 drops into both eyes 4 (four) times daily as needed for eye irritation or allergies. 15 mL 0   nitroGLYCERIN (NITROSTAT) 0.4 MG SL tablet Place 1 tablet (0.4 mg total) under the tongue every 5 (five) minutes x 3 doses as needed for chest pain. 25 tablet 2   phenol (CHLORASEPTIC) 1.4 % LIQD Use as directed 1 spray in the mouth or throat as needed for throat irritation / pain. 20 mL 0   polyethylene glycol (MIRALAX / GLYCOLAX) 17 g packet Take 17 g by mouth daily. 14 each 0   PREVIDENT 5000 DRY MOUTH 1.1 % GEL dental gel Place 1 Application onto teeth at bedtime.     senna-docusate (SENOKOT-S) 8.6-50 MG tablet Take 1  tablet by mouth 2 (two) times daily. 30 tablet 0   spironolactone (ALDACTONE) 25 MG tablet Take 25 mg by mouth daily. Per cardiology     No current facility-administered medications for this visit.    Allergies  Allergen Reactions   Bee Venom Swelling    Facial swelling   Betadine [Povidone Iodine] Other (See Comments)    blistering   Doxycycline     Possible drug rash- back of right lower leg and onto left lower leg as well    Social History   Socioeconomic History   Marital status: Married    Spouse name: Not on file   Number of children: 3   Years of  education: Not on file   Highest education level: Not on file  Occupational History   Not on file  Tobacco Use   Smoking status: Former    Packs/day: 2.00    Years: 30.00    Total pack years: 60.00    Types: Cigarettes    Quit date: 03/11/1983    Years since quitting: 39.1   Smokeless tobacco: Never   Tobacco comments:    started smoking in the service; ICU 85' part of his stomach; appendix; coma   Vaping Use   Vaping Use: Never used  Substance and Sexual Activity   Alcohol use: Not Currently    Comment: RARE, maybe one drink a couple times a year   Drug use: Never   Sexual activity: Not Currently  Other Topics Concern   Not on file  Social History Narrative   [employer], [place].Married 43 - Divorced 1996; remarried '03 different wife3 sons - '63, '65, '67Grandchildren -14; 1 great grand daughter; 4 great grands on wife's side, 1 great great granddaughter      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      Hobbies: fishing, busy volunteering   Social Determinants of [place] Strain: [place]  (05/17/2021)   Overall Financial Resource Strain (CARDIA)    Difficulty of Paying Living Expenses: Not hard at all  Food Insecurity: No Food Insecurity (03/31/2022)   Hunger Vital Sign    Worried About Running Out of Food in the Last Year: Never true    [place] in the Last Year: Never true  Transportation Needs: No Transportation Needs (03/31/2022)   PRAPARE - Hydrologist (Medical): No    Lack of Transportation (Non-Medical): No  Physical Activity: Sufficiently Active (05/17/2021)   Exercise Vital Sign    Days of Exercise per Week: 3 days    Minutes of Exercise per Session: 60 min  Stress: No Stress Concern Present (05/17/2021)   [place]    Feeling of Stress : Not at all  Social Connections: [place] (05/17/2021)    Social Connection and Isolation Panel [NHANES]    Frequency of Communication with Friends and Family: Once a week    Frequency of Social Gatherings with Friends and Family: More than three times a week    Attends Religious Services: More than 4 times per year    Active Member of [employer] or Organizations: Yes    Attends Archivist Meetings: 1 to 4 times per year    Marital Status: Married  Human resources officer Violence: Not At Risk (03/20/2022)   Humiliation, Afraid, Rape, and Kick questionnaire    Fear of Current or Ex-Partner: No  Emotionally Abused: No    Physically Abused: No    Sexually Abused: No     Review of Systems: As noted in HPI.  All other systems reviewed and are otherwise negative except as noted above.  Blood pressure (!) 118/50, pulse 63, height '6\' 1"'$  (1.854 m), weight 183 lb 12.8 oz (83.4 kg), SpO2 98 %.  GENERAL:  Well appearing WM in NAD HEENT:  PERRL, EOMI, sclera are clear. Oropharynx is clear. NECK:  No jugular venous distention, carotid upstroke brisk and symmetric, no bruits, no thyromegaly or adenopathy LUNGS:  Clear to auscultation bilaterally CHEST:  Unremarkable HEART:  RRR,  PMI not displaced or sustained,S1 and S2 within normal limits, no S3, no S4: no clicks, no rubs, no murmurs ABD:  Soft, nontender. BS +, no masses or bruits. No hepatomegaly, no splenomegaly EXT:  2 + pulses throughout, no edema, no cyanosis no clubbing SKIN:  Warm and dry.  No rashes NEURO:  Alert and oriented x 3. Cranial nerves II through XII intact. PSYCH:  Cognitively intact   Laboratory data:  Lab Results  Component Value Date   WBC 8.6 04/04/2022   HGB 12.1 (L) 04/04/2022   HCT 35.7 (L) 04/04/2022   PLT 374.0 04/04/2022   GLUCOSE 86 04/04/2022   CHOL 68 03/28/2022   TRIG 56 03/28/2022   HDL 20 (L) 03/28/2022   LDLDIRECT 33.0 06/25/2020   LDLCALC 37 03/28/2022   ALT 14 04/04/2022   AST 14 04/04/2022   NA 137 04/04/2022   K 4.2 04/04/2022   CL 99 04/04/2022    CREATININE 0.98 04/04/2022   BUN 16 04/04/2022   CO2 29 04/04/2022   TSH 3.09 06/20/2019   PSA 0.00 (L) 01/09/2022   INR 1.1 12/17/2020   HGBA1C 5.3 11/04/2015    Ecg: today NSR with frequent PVCs. LAD. Otherwise normal. Rate 63. I have personally reviewed and interpreted this study.  Echo 07/29/21: IMPRESSIONS     1. Poor acoustic windows limit study Difficult to see endocardium. LVEF  is depressed, mild/mild to moderate. Lateral and inferior wall appear  hypokinetic     WOuld reocmm limited echo with Defnity to further define LVEF and wall  motion.. The left ventricular internal cavity size was mildly dilated.  There is mild left ventricular hypertrophy.   2. Right ventricular systolic function is normal. The right ventricular  size is normal.   3. Left atrial size was severely dilated.   4. The mitral valve is normal in structure. Mild mitral valve  regurgitation.   5. AV is thickened, calcified with very mildly restricted motion. Peak  and mean gradients through the valve are 21 and 11 mm Hg respectively..  The aortic valve is tricuspid. Aortic valve regurgitation is not  visualized.   6. Aortic dilatation noted. There is borderline dilatation of the  ascending aorta and of the aortic root, measuring 38 mm. There is mild  dilatation of the aortic root and of the ascending aorta, measuring 39 mm.   7. The inferior vena cava is normal in size with greater than 50%  respiratory variability, suggesting right atrial pressure of 3 mmHg.    ASSESSMENT AND PLAN:  1. CAD s/p inferolateral STEMI in August 2017 with thrombotic occlusion of the LCx. S/p DES. Subsequent staged PCI of the proximal LAD and RCA. He has no anginal symptoms. Continue ASA. Continue low dose Coreg.  2. Ischemic LV dysfunction/ chronic systolic CHF. Asymptomatic. On Coreg. Losartan, aldactone. Volume status looks good.  EF mild to moderately reduced on last Echo in May 8527. 3. S/p embolic CVA secondary to  PCI procedure. No residual symptoms.  4. Hypertension. Well controlled  5. Hyperlipidemia. On high dose statin.  LDL 37 6. Sleep apnea. CPAP per pulmonary 7. Follicular lymphoma- per oncology 8. Recent partial SBO. Felt to be related to old adhesions.    PLAN   Follow up in one year  Shayon Trompeter Martinique MD,FACC 04/14/2022 1:27 PM

## 2022-04-11 ENCOUNTER — Inpatient Hospital Stay: Payer: Medicare Other | Admitting: Family Medicine

## 2022-04-14 ENCOUNTER — Encounter: Payer: Self-pay | Admitting: Cardiology

## 2022-04-14 ENCOUNTER — Ambulatory Visit: Payer: Medicare Other | Attending: Cardiology | Admitting: Cardiology

## 2022-04-14 VITALS — BP 118/50 | HR 63 | Ht 73.0 in | Wt 183.8 lb

## 2022-04-14 DIAGNOSIS — I2510 Atherosclerotic heart disease of native coronary artery without angina pectoris: Secondary | ICD-10-CM

## 2022-04-14 DIAGNOSIS — I5022 Chronic systolic (congestive) heart failure: Secondary | ICD-10-CM | POA: Diagnosis not present

## 2022-04-14 DIAGNOSIS — I10 Essential (primary) hypertension: Secondary | ICD-10-CM

## 2022-04-14 DIAGNOSIS — E785 Hyperlipidemia, unspecified: Secondary | ICD-10-CM | POA: Diagnosis not present

## 2022-04-14 NOTE — Patient Instructions (Signed)
Medication Instructions:   Your physician recommends that you continue on your current medications as directed. Please refer to the Current Medication list given to you today.  *If you need a refill on your cardiac medications before your next appointment, please call your pharmacy*  Lab Work: NONE ordered at this time of appointment   If you have labs (blood work) drawn today and your tests are completely normal, you will receive your results only by: MyChart Message (if you have MyChart) OR A paper copy in the mail If you have any lab test that is abnormal or we need to change your treatment, we will call you to review the results.  Testing/Procedures: NONE ordered at this time of appointment   Follow-Up: At [facility], you and your health needs are our priority.  As part of our continuing mission to provide you with exceptional heart care, we have created designated Provider Care Teams.  These Care Teams include your primary Cardiologist (physician) and Advanced Practice Providers (APPs -  Physician Assistants and Nurse Practitioners) who all work together to provide you with the care you need, when you need it.    Your next appointment:   1 year(s)  Provider:   Peter Jordan, MD     Other Instructions   

## 2022-04-17 ENCOUNTER — Encounter: Payer: Self-pay | Admitting: Family Medicine

## 2022-04-17 NOTE — Addendum Note (Signed)
Addended by: [name] on: 04/17/2022 12:44 PM   Modules accepted: Orders

## 2022-05-06 ENCOUNTER — Telehealth: Payer: Self-pay | Admitting: Hematology and Oncology

## 2022-05-06 NOTE — Telephone Encounter (Signed)
Called patient per provider PAL to reschedule May appointments. Unable to leave voicemail, patient's phone just kept ringing. Mailing out reminders.

## 2022-05-08 ENCOUNTER — Telehealth: Payer: Self-pay | Admitting: *Deleted

## 2022-05-08 DIAGNOSIS — I2510 Atherosclerotic heart disease of native coronary artery without angina pectoris: Secondary | ICD-10-CM

## 2022-05-08 DIAGNOSIS — I10 Essential (primary) hypertension: Secondary | ICD-10-CM

## 2022-05-08 DIAGNOSIS — R531 Weakness: Secondary | ICD-10-CM

## 2022-05-08 NOTE — Telephone Encounter (Signed)
representative for [facility] walked in requesting [facility] RN and PT order from Dr. Martinique.    Called representative to discuss Phillis Knack # [phone]  Patient recently hospitalized and [facility] ordered by PCP.   He is requesting order from Dr. Martinique for CAD for insurance to cover his [facility].  Patient has been waiting for [facility] since January.    He reports if order comes from Dr. Martinique with CAD and weakness, insurance will cover.   Advised HH ordered and previous referral and note have CAD as diagnosis.    Advised would send to Dr. Martinique to review.

## 2022-05-13 DIAGNOSIS — L821 Other seborrheic keratosis: Secondary | ICD-10-CM | POA: Diagnosis not present

## 2022-05-13 DIAGNOSIS — L814 Other melanin hyperpigmentation: Secondary | ICD-10-CM | POA: Diagnosis not present

## 2022-05-13 DIAGNOSIS — D1801 Hemangioma of skin and subcutaneous tissue: Secondary | ICD-10-CM | POA: Diagnosis not present

## 2022-05-14 NOTE — Telephone Encounter (Signed)
Jose Ware aware order placed.  He will pull order from Epic.

## 2022-05-14 NOTE — Telephone Encounter (Signed)
Left message for [facility] to call back

## 2022-05-20 ENCOUNTER — Ambulatory Visit (INDEPENDENT_AMBULATORY_CARE_PROVIDER_SITE_OTHER): Payer: Medicare Other

## 2022-05-20 VITALS — Wt 183.0 lb

## 2022-05-20 DIAGNOSIS — Z0000 Encounter for general adult medical examination without abnormal findings: Secondary | ICD-10-CM

## 2022-05-20 NOTE — Patient Instructions (Signed)
[name] , Thank you for taking time to come for your Medicare Wellness Visit. I appreciate your ongoing commitment to your health goals. Please review the following plan we discussed and let me know if I can assist you in the future.   These are the goals we discussed:  Goals      Patient Stated     Continue with your exercise      Patient Stated     Ambulate better without staggering      Patient Stated     Stay as healthy as possible      Patient Stated     Get back to walking more      Track and Manage My Blood Pressure-Hypertension     Timeframe:  Long-Range Goal Priority:  High Start Date: 05/28/21                            Expected End Date: 11/28/21                      Follow Up Date 08/28/21    - check blood pressure daily - choose a place to take my blood pressure (home, clinic or office, retail store) - write blood pressure results in a log or diary    Why is this important?   You won't feel high blood pressure, but it can still hurt your blood vessels.  High blood pressure can cause heart or kidney problems. It can also cause a stroke.  Making lifestyle changes like losing a little weight or eating less salt will help.  Checking your blood pressure at home and at different times of the day can help to control blood pressure.  If the doctor prescribes medicine remember to take it the way the doctor ordered.  Call the office if you cannot afford the medicine or if there are questions about it.     Notes:         This is a list of the screening recommended for you and due dates:  Health Maintenance  Topic Date Due   COVID-19 Vaccine (7 - 2023-24 season) 02/06/2022   Colon Cancer Screening  03/14/2023   Medicare Annual Wellness Visit  05/20/2023   DTaP/Tdap/Td vaccine (3 - Td or Tdap) 02/28/2024   Pneumonia Vaccine  Completed   Flu Shot  Completed   Zoster (Shingles) Vaccine  Completed   HPV Vaccine  Aged Out    Advanced directives: Please bring  a copy of your health care power of attorney and living will to the office at your convenience.  Conditions/risks identified: get back to walking more   Next appointment: Follow up in one year for your annual wellness visit.   Preventive Care 34 Years and Older, Male  Preventive care refers to lifestyle choices and visits with your health care provider that can promote health and wellness. What does preventive care include? A yearly physical exam. This is also called an annual well check. Dental exams once or twice a year. Routine eye exams. Ask your health care provider how often you should have your eyes checked. Personal lifestyle choices, including: Daily care of your teeth and gums. Regular physical activity. Eating a healthy diet. Avoiding tobacco and drug use. Limiting alcohol use. Practicing safe sex. Taking low doses of aspirin every day. Taking vitamin and mineral supplements as recommended by your health care provider. What happens during an annual well check?  The services and screenings done by your health care provider during your annual well check will depend on your age, overall health, lifestyle risk factors, and family history of disease. Counseling  Your health care provider may ask you questions about your: Alcohol use. Tobacco use. Drug use. Emotional well-being. Home and relationship well-being. Sexual activity. Eating habits. History of falls. Memory and ability to understand (cognition). Work and work Statistician. Screening  You may have the following tests or measurements: Height, weight, and BMI. Blood pressure. Lipid and cholesterol levels. These may be checked every 5 years, or more frequently if you are over 32 years old. Skin check. Lung cancer screening. You may have this screening every year starting at age 78 if you have a 30-pack-year history of smoking and currently smoke or have quit within the past 15 years. Fecal occult blood test (FOBT)  of the stool. You may have this test every year starting at age 58. Flexible sigmoidoscopy or colonoscopy. You may have a sigmoidoscopy every 5 years or a colonoscopy every 10 years starting at age 63. Prostate cancer screening. Recommendations will vary depending on your family history and other risks. Hepatitis C blood test. Hepatitis B blood test. Sexually transmitted disease (STD) testing. Diabetes screening. This is done by checking your blood sugar (glucose) after you have not eaten for a while (fasting). You may have this done every 1-3 years. Abdominal aortic aneurysm (AAA) screening. You may need this if you are a current or former smoker. Osteoporosis. You may be screened starting at age 97 if you are at high risk. Talk with your health care provider about your test results, treatment options, and if necessary, the need for more tests. Vaccines  Your health care provider may recommend certain vaccines, such as: Influenza vaccine. This is recommended every year. Tetanus, diphtheria, and acellular pertussis (Tdap, Td) vaccine. You may need a Td booster every 10 years. Zoster vaccine. You may need this after age 59. Pneumococcal 13-valent conjugate (PCV13) vaccine. One dose is recommended after age 48. Pneumococcal polysaccharide (PPSV23) vaccine. One dose is recommended after age 38. Talk to your health care provider about which screenings and vaccines you need and how often you need them. This information is not intended to replace advice given to you by your health care provider. Make sure you discuss any questions you have with your health care provider. Document Released: 03/23/2015 Document Revised: 11/14/2015 Document Reviewed: 12/26/2014 Elsevier Interactive Patient Education  2017 [place] Prevention in the Home Falls can cause injuries. They can happen to people of all ages. There are many things you can do to make your home safe and to help prevent falls. What  can I do on the outside of my home? Regularly fix the edges of walkways and driveways and fix any cracks. Remove anything that might make you trip as you walk through a door, such as a raised step or threshold. Trim any bushes or trees on the path to your home. Use bright outdoor lighting. Clear any walking paths of anything that might make someone trip, such as rocks or tools. Regularly check to see if handrails are loose or broken. Make sure that both sides of any steps have handrails. Any raised decks and porches should have guardrails on the edges. Have any leaves, snow, or ice cleared regularly. Use sand or salt on walking paths during winter. Clean up any spills in your garage right away. This includes oil or grease spills. What can I  do in the bathroom? Use night lights. Install grab bars by the toilet and in the tub and shower. Do not use towel bars as grab bars. Use non-skid mats or decals in the tub or shower. If you need to sit down in the shower, use a plastic, non-slip stool. Keep the floor dry. Clean up any water that spills on the floor as soon as it happens. Remove soap buildup in the tub or shower regularly. Attach bath mats securely with double-sided non-slip rug tape. Do not have throw rugs and other things on the floor that can make you trip. What can I do in the bedroom? Use night lights. Make sure that you have a light by your bed that is easy to reach. Do not use any sheets or blankets that are too big for your bed. They should not hang down onto the floor. Have a firm chair that has side arms. You can use this for support while you get dressed. Do not have throw rugs and other things on the floor that can make you trip. What can I do in the kitchen? Clean up any spills right away. Avoid walking on wet floors. Keep items that you use a lot in easy-to-reach places. If you need to reach something above you, use a strong step stool that has a grab bar. Keep  electrical cords out of the way. Do not use floor polish or wax that makes floors slippery. If you must use wax, use non-skid floor wax. Do not have throw rugs and other things on the floor that can make you trip. What can I do with my stairs? Do not leave any items on the stairs. Make sure that there are handrails on both sides of the stairs and use them. Fix handrails that are broken or loose. Make sure that handrails are as long as the stairways. Check any carpeting to make sure that it is firmly attached to the stairs. Fix any carpet that is loose or worn. Avoid having throw rugs at the top or bottom of the stairs. If you do have throw rugs, attach them to the floor with carpet tape. Make sure that you have a light switch at the top of the stairs and the bottom of the stairs. If you do not have them, ask someone to add them for you. What else can I do to help prevent falls? Wear shoes that: Do not have high heels. Have rubber bottoms. Are comfortable and fit you well. Are closed at the toe. Do not wear sandals. If you use a stepladder: Make sure that it is fully opened. Do not climb a closed stepladder. Make sure that both sides of the stepladder are locked into place. Ask someone to hold it for you, if possible. Clearly mark and make sure that you can see: Any grab bars or handrails. First and last steps. Where the edge of each step is. Use tools that help you move around (mobility aids) if they are needed. These include: Canes. Walkers. Scooters. Crutches. Turn on the lights when you go into a dark area. Replace any light bulbs as soon as they burn out. Set up your furniture so you have a clear path. Avoid moving your furniture around. If any of your floors are uneven, fix them. If there are any pets around you, be aware of where they are. Review your medicines with your doctor. Some medicines can make you feel dizzy. This can increase your chance of falling. Ask your  doctor  what other things that you can do to help prevent falls. This information is not intended to replace advice given to you by your health care provider. Make sure you discuss any questions you have with your health care provider. Document Released: 12/21/2008 Document Revised: 08/02/2015 Document Reviewed: 03/31/2014 Elsevier Interactive Patient Education  2017 [employer].

## 2022-05-20 NOTE — Progress Notes (Addendum)
I connected with  [name] on 05/20/22 by a audio enabled telemedicine application and verified that I am speaking with the correct person using two identifiers.  Patient Location: Home  Provider Location: Office/Clinic  I discussed the limitations of evaluation and management by telemedicine. The patient expressed understanding and agreed to proceed.   Subjective:   Jose Ware is a 88 y.o. male who presents for Medicare Annual/Subsequent preventive examination.  Review of Systems     Cardiac Risk Factors include: advanced age (>45mn, >>73women);dyslipidemia;hypertension;male gender     Objective:    Today's Vitals   05/20/22 1035  Weight: 183 lb (83 kg)   Body mass index is 24.14 kg/m.     05/20/2022   10:40 AM 03/21/2022    3:28 AM 03/20/2022    8:48 PM 03/19/2022    9:04 AM 03/13/2022   11:45 AM 03/05/2022    2:20 PM 07/19/2021    8:26 AM  Advanced Directives  Does Patient Have a Medical Advance Directive? Yes  No Yes Yes Yes   Type of AParamedicof AInnsbrookLiving will   HGray CourtLiving will [place]avernierLiving will Living will;Healthcare Power of Attorney   Does patient want to make changes to medical advance directive?   No - Patient declined   No - Patient declined No - Patient declined  Copy of H[place]aglandin Chart? No - copy requested    No - copy requested No - copy requested   Would patient like information on creating a medical advance directive?  No - Patient declined     No - Patient declined    Current Medications (verified) Outpatient Encounter Medications as of 05/20/2022  Medication Sig   amLODipine (NORVASC) 5 MG tablet Take 1 tablet by mouth once daily (Patient taking differently: Take 5 mg by mouth daily.)   aspirin EC 81 MG tablet Take 1 tablet (81 mg total) by mouth daily.   atorvastatin (LIPITOR) 10 MG tablet Take 1 tablet (10 mg total) by mouth every other  day.   carvedilol (COREG) 3.125 MG tablet Take 1 tablet by mouth twice daily (Patient taking differently: Take 3.125 mg by mouth 2 (two) times daily.)   Cyanocobalamin (VITAMIN B-12) 5000 MCG TBDP Take 5,000 mcg by mouth every morning.   dicyclomine (BENTYL) 20 MG tablet Take 1 tablet (20 mg total) by mouth 3 (three) times daily as needed for spasms.   losartan (COZAAR) 100 MG tablet Take 1 tablet by mouth once daily (Patient taking differently: Take 100 mg by mouth daily.)   Multiple Vitamins-Minerals (OCUVITE ADULT 50+ PO) Take 1 tablet by mouth daily.   naphazoline-pheniramine (NAPHCON-A) 0.025-0.3 % ophthalmic solution Place 2 drops into both eyes 4 (four) times daily as needed for eye irritation or allergies.   nitroGLYCERIN (NITROSTAT) 0.4 MG SL tablet Place 1 tablet (0.4 mg total) under the tongue every 5 (five) minutes x 3 doses as needed for chest pain.   phenol (CHLORASEPTIC) 1.4 % LIQD Use as directed 1 spray in the mouth or throat as needed for throat irritation / pain.   polyethylene glycol (MIRALAX / GLYCOLAX) 17 g packet Take 17 g by mouth daily.   PREVIDENT 5000 DRY MOUTH 1.1 % GEL dental gel Place 1 Application onto teeth at bedtime.   senna-docusate (SENOKOT-S) 8.6-50 MG tablet Take 1 tablet by mouth 2 (two) times daily.   spironolactone (ALDACTONE) 25 MG tablet Take 25 mg by  mouth daily. Per cardiology   No facility-administered encounter medications on file as of 05/20/2022.    Allergies (verified) Bee venom, Betadine [povidone iodine], and Doxycycline   History: Past Medical History:  Diagnosis Date   Adenocarcinoma of prostate ([place])    XRT & Radiation seed implantation in 2010   Adenomatous colon polyp    CAD (coronary artery disease)    a. 11/04/15:  Acute inferolateral STEMI: S/p emergent DES of a very large LCx with extensive thrombus. Significant residual disease in the proximal LAD and distal RCA. S/p staged PCI of LAD and RCA 8/29.   Cataract    CVA (cerebral  infarction)    a. 123456: embolic CVA after heart cath    CVA (cerebral infarction)    a. 123456: embolic CVA after heart cath    DIVERTICULITIS, HX OF 11/27/2006        DJD (degenerative joint disease)    wrist - R    Hernia    History of blood transfusion 1985   with colon surgery   History of kidney stones    Hypertension    Ischemic cardiomyopathy    a. EF 25-30% by LV gram on cath 11/04/15. Appeared out of proportion to infarct although infarct was large. EF improved by Echo and now 40-45%.   Myocardial infarction ([place]) 2019   NEPHROLITHIASIS, HX OF 11/27/2006         Non Hodgkin's lymphoma ([place]) 11/2020   watching at this point, in abdomen, no meds or chemo yet as of 01-27-2022   Peripheral neuropathy    pt denies   Pneumonia    as a child   Psoriasis    Sleep apnea    wears cpap   Stroke [facility])    no residuals   Tenosynovitis    Past Surgical History:  Procedure Laterality Date   APPENDECTOMY  03/11/1983   BACK SURGERY  03/10/1978   minor disk surgery: instrumentation placed and removed in a second procedure   CARDIAC CATHETERIZATION N/A 11/04/2015   Procedure: Left Heart Cath and Coronary Angiography;  Surgeon: Peter M Martinique, MD;  Location: [place] [facility];  Service: Cardiovascular;  Laterality: N/A;   CARDIAC CATHETERIZATION N/A 11/04/2015   Procedure: Coronary Stent Intervention;  Surgeon: Peter M Martinique, MD;  Location: [place] [facility];  Service: Cardiovascular;  Laterality: N/A;   CARDIAC CATHETERIZATION N/A 11/06/2015   Procedure: Coronary Stent Intervention;  Surgeon: Leonie Man, MD;  Location: [place] [facility];  Service: Cardiovascular;  Laterality: N/A;   CATARACT EXTRACTION, BILATERAL  03/11/2011   CHOLECYSTECTOMY  03/10/1984   COLON SURGERY  11/09/1983   pt. had ileus, had colon surgery, requiring colostomy & then reversal & then dehisence of that wound & return to OR for repair & cholecystectomy     COLONOSCOPY  2019   COLONOSCOPY WITH  PROPOFOL N/A 03/13/2022   Procedure: COLONOSCOPY WITH PROPOFOL;  Surgeon: Ladene Artist, MD;  Location: [facility];  Service: Gastroenterology;  Laterality: N/A;   COLOSTOMY  03/11/1983   After colectomy for diverticulitis, pt. remarks during this surgery they "gave me the paddles two times  because of bleeding", pt. states it was unrelated to any anesthesia complication   EYE SURGERY     /w IOL   KNEE ARTHROSCOPY Left 03/10/2001   KNEE SURGERY Left 03/10/1954   Left 1956, Right w/cartilage removed later   PARATHYROIDECTOMY N/A 05/12/2014   Procedure: PARATHYROIDECTOMY;  Surgeon: Armandina Gemma, MD;  Location: [place] [place];  Service: General;  Laterality: N/A;   POLYPECTOMY     POLYPECTOMY  03/13/2022   Procedure: POLYPECTOMY;  Surgeon: Ladene Artist, MD;  Location: [facility];  Service: Gastroenterology;;   REVERSAL OF COLOSTOMY  03/10/1985   TONSILLECTOMY  03/10/1944   WRIST SURGERY Left    Remote complicated w/infection   Family History  Problem Relation Age of Onset   Coronary artery disease Mother    Alcohol abuse Father    Cirrhosis Father    Heart failure Sister    Breast cancer Sister        W/involvement of right arm leading to amputation   Anemia Sister        related to treatment to lymphoma   Non-Hodgkin's lymphoma Sister    Other Sister        died from covid 2020/06/06   Coronary artery disease Brother    Heart attack Brother    Non-Hodgkin's lymphoma Brother    Clotting disorder Brother    Non-Hodgkin's lymphoma Brother    Prostate cancer Neg Hx    Colon cancer Neg Hx    Diabetes Neg Hx    Glaucoma Neg Hx    Rectal cancer Neg Hx    Esophageal cancer Neg Hx    Colon polyps Neg Hx    Stomach cancer Neg Hx    Social History   Socioeconomic History   Marital status: Married    Spouse name: Not on file   Number of children: 3   Years of education: Not on file   Highest education level: Not on file  Occupational History   Not on file  Tobacco Use   Smoking  status: Former    Packs/day: 2.00    Years: 30.00    Total pack years: 60.00    Types: Cigarettes    Quit date: 03/11/1983    Years since quitting: 39.2   Smokeless tobacco: Never   Tobacco comments:    started smoking in the service; ICU 06-07-1983' part of his stomach; appendix; coma   Vaping Use   Vaping Use: Never used  Substance and Sexual Activity   Alcohol use: Not Currently    Comment: RARE, maybe one drink a couple times a year   Drug use: Never   Sexual activity: Not Currently  Other Topics Concern   Not on file  Social History Narrative   [employer], [place].Married 32 - Divorced Jun 07, 1994; remarried '03 different wife3 sons - '63, '65, '67Grandchildren -14; 1 great grand daughter; 4 great grands on wife's side, 1 great great granddaughter      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      Hobbies: fishing, busy volunteering   Social Determinants of [place] Strain: [place]  (05/20/2022)   Overall Financial Resource Strain (CARDIA)    Difficulty of Paying Living Expenses: Not hard at all  Food Insecurity: No Food Insecurity (05/20/2022)   Hunger Vital Sign    Worried About Running Out of Food in the Last Year: Never true    [place] in the Last Year: Never true  Transportation Needs: No Transportation Needs (05/20/2022)   PRAPARE - Hydrologist (Medical): No    Lack of Transportation (Non-Medical): No  Physical Activity: Inactive (05/20/2022)   Exercise Vital Sign    Days of Exercise per Week: 0 days    Minutes of Exercise per Session: 0 min  Stress: No Stress Concern Present (05/20/2022)   [place]    Feeling of Stress : Not at all  Social Connections: [place] (05/20/2022)   Social Connection and Isolation Panel [NHANES]    Frequency of Communication with Friends and Family: More than three times a week     Frequency of Social Gatherings with Friends and Family: More than three times a week    Attends Religious Services: More than 4 times per year    Active Member of [employer] or Organizations: Yes    Attends Archivist Meetings: 1 to 4 times per year    Marital Status: Married    Tobacco Counseling Counseling given: Not Answered Tobacco comments: started smoking in the service; ICU 85' part of his stomach; appendix; coma    Clinical Intake:  Pre-visit preparation completed: Yes  Pain : No/denies pain     BMI - recorded: 24.14 Nutritional Status: BMI of 19-24  Normal Nutritional Risks: None Diabetes: No  How often do you need to have someone help you when you read instructions, pamphlets, or other written materials from your doctor or pharmacy?: 1 - Never  Diabetic?no  Interpreter Needed?: No  Information entered by :: Charlott Rakes, LPN   Activities of Daily Living    05/20/2022   10:41 AM 03/20/2022   11:52 PM  In your present state of health, do you have any difficulty performing the following activities:  Hearing? 0 0  Vision? 0 0  Difficulty concentrating or making decisions? 0 0  Walking or climbing stairs? 0 0  Dressing or bathing? 0 0  Doing errands, shopping? 0 0  Preparing Food and eating ? N   Using the Toilet? N   In the past six months, have you accidently leaked urine? N   Comment wears pad just in case   Do you have problems with loss of bowel control? N   Managing your Medications? N   Managing your Finances? N   Housekeeping or managing your Housekeeping? N     Patient Care Team: [name], MD as PCP - General (Family Medicine) Martinique, Peter M, MD as PCP - Cardiology (Cardiology) Lowella Bandy, MD (Inactive) (Urology) Ladene Artist, MD (Gastroenterology) Martinique, Peter M, MD as Consulting Physician (Cardiology) Keene Breath., MD as Consulting Physician (Ophthalmology) Festus Aloe, MD as Consulting Physician  (Urology) Edythe Clarity, [facility] as Pharmacist (Pharmacist)  Indicate any recent Medical Services you may have received from other than [facility] providers in the past year (date may be approximate).     Assessment:   This is a routine wellness examination for Jose Ware.  Hearing/Vision screen Hearing Screening - Comments:: Pt denies any hearing issues  Vision Screening - Comments:: Pt follows up with Dr Chilton Greathouse for annual eye exam   Dietary issues and exercise activities discussed: Current Exercise Habits: The patient does not participate in regular exercise at present   Goals Addressed             This Visit's Progress    Patient Stated       Get back to walking more        Depression Screen    05/20/2022   10:39 AM 04/04/2022    9:08 AM 05/17/2021   10:15 AM 12/27/2020   10:38 AM 06/25/2020    9:20 AM 05/10/2020   10:22 AM 04/04/2019   10:45 AM  PHQ 2/9 Scores  PHQ - 2  Score 1 2 0 0 0 0 0  PHQ- 9 Score  5         Fall Risk    05/20/2022   10:41 AM 04/04/2022    9:08 AM 05/17/2021   10:18 AM 12/27/2020   10:38 AM 06/25/2020    9:20 AM  Fall Risk   Falls in the past year? 1 0 1 1 0  Number falls in past yr: 1 0 1 0 0  Injury with Fall? 0 0 0 0 0  Risk for fall due to : Impaired vision;Impaired balance/gait;Impaired mobility History of fall(s) Impaired vision;Impaired balance/gait Impaired balance/gait;History of fall(s);Impaired mobility   Follow up Falls prevention discussed Falls evaluation completed Falls prevention discussed      FALL RISK PREVENTION PERTAINING TO THE HOME:  Any stairs in or around the home? No  If so, are there any without handrails? No  Home free of loose throw rugs in walkways, pet beds, electrical cords, etc? Yes  Adequate lighting in your home to reduce risk of falls? Yes   ASSISTIVE DEVICES UTILIZED TO PREVENT FALLS:  Life alert? No  Use of a cane, walker or w/c? Yes  Grab bars in the bathroom? Yes  Shower chair or bench in shower?  Yes  Elevated toilet seat or a handicapped toilet? No   TIMED UP AND GO:  Was the test performed? No .    Cognitive Function:        05/20/2022   10:42 AM 05/17/2021   10:20 AM 05/10/2020   10:28 AM 04/04/2019   10:45 AM  6CIT Screen  What Year? 0 points 0 points 0 points 0 points  What month? 0 points 0 points 0 points 0 points  What time? 0 points 0 points  0 points  Count back from 20 0 points 0 points 0 points 0 points  Months in reverse 0 points 0 points 0 points 0 points  Repeat phrase 0 points 0 points 2 points 0 points  Total Score 0 points 0 points  0 points    Immunizations Immunization History  Administered Date(s) Administered   COVID-19, mRNA, vaccine(Comirnaty)12 years and older 12/12/2021   Fluad Quad(high Dose 65+) 12/17/2018, 12/23/2019, 12/25/2020, 11/20/2021   Influenza Split 01/06/2012   Influenza Whole 12/19/2008, 12/27/2009   Influenza, High Dose Seasonal PF 12/21/2012, 12/22/2014, 02/05/2017, 12/08/2017   Influenza,inj,Quad PF,6+ Mos 12/01/2013, 10/24/2015   PFIZER(Purple Top)SARS-COV-2 Vaccination 04/07/2019, 04/21/2019, 12/08/2019, 07/14/2020   Pfizer Covid-19 Vaccine Bivalent Booster 56yr & up 12/29/2020, 11/20/2021   Pneumococcal Conjugate-13 09/14/2015   Pneumococcal Polysaccharide-23 02/12/2009   RSV,unspecified 12/12/2021   Td 03/12/2003   Tdap 02/27/2014   Zoster Recombinat (Shingrix) 11/24/2017, 01/26/2018    TDAP status: Up to date  Flu Vaccine status: Up to date  Pneumococcal vaccine status: Up to date  Covid-19 vaccine status: Completed vaccines  Qualifies for Shingles Vaccine? Yes   Zostavax completed Yes   Shingrix Completed?: Yes  Screening Tests Health Maintenance  Topic Date Due   COVID-19 Vaccine (7 - 2023-24 season) 02/06/2022   COLONOSCOPY (Pts 45-467yrInsurance coverage will need to be confirmed)  03/14/2023   Medicare Annual Wellness (AWV)  05/20/2023   DTaP/Tdap/Td (3 - Td or Tdap) 02/28/2024   Pneumonia  Vaccine 6513Years old  Completed   INFLUENZA VACCINE  Completed   Zoster Vaccines- Shingrix  Completed   HPV VACCINES  Aged Out    Health Maintenance  Health Maintenance Due  Topic Date Due   COVID-19  Vaccine (7 - 2023-24 season) 02/06/2022    Colorectal cancer screening: Type of screening: Colonoscopy. Completed 03/13/22. Repeat every 1 years   Additional Screening:  Vision Screening: Recommended annual ophthalmology exams for early detection of glaucoma and other disorders of the eye. Is the patient up to date with their annual eye exam?  Yes  Who is the provider or what is the name of the office in which the patient attends annual eye exams? Dr  Sherral Hammers  If pt is not established with a provider, would they like to be referred to a provider to establish care? No .   Dental Screening: Recommended annual dental exams for proper oral hygiene  Community Resource Referral / Chronic Care Management: CRR required this visit?  No   CCM required this visit?  No      Plan:     I have personally reviewed and noted the following in the patient's chart:   Medical and social history Use of alcohol, tobacco or illicit drugs  Current medications and supplements including opioid prescriptions. Patient is not currently taking opioid prescriptions. Functional ability and status Nutritional status Physical activity Advanced directives List of other physicians Hospitalizations, surgeries, and ER visits in previous 12 months Vitals Screenings to include cognitive, depression, and falls Referrals and appointments  In addition, I have reviewed and discussed with patient certain preventive protocols, quality metrics, and best practice recommendations. A written personalized care plan for preventive services as well as general preventive health recommendations were provided to patient.     Willette Brace, LPN   [phone]   Nurse Notes: none

## 2022-05-30 ENCOUNTER — Telehealth: Payer: Self-pay | Admitting: Family Medicine

## 2022-05-30 NOTE — Telephone Encounter (Signed)
Patient states he has been experiencing ongoing/worsening symptoms:  Unable to catch breath, Unable to use CPAP machine because he can't breathe if it is on, Shortness of breath, Wheezing, Rash on back of right leg, Winded  Transferred to Triage

## 2022-05-30 NOTE — Telephone Encounter (Signed)
Please follow-up with him on Monday to check on situation-glad he agreed to emergency room visit

## 2022-05-30 NOTE — Telephone Encounter (Signed)
Patient advised Go to ED now   Patient Name: Jose Ware [facility] Gender: Male DOB: Nov 19, 1934 Age: 88 Y 4 D Return Phone Number: [phone] (Primary) Address: City/ State/ Zip: [place]  [address] Client [place] [place] Client Site [facility] [place] Provider Garret Reddish- MD Contact Type Call Who Is Calling Patient / Member / Family / Caregiver Call Type Triage / Clinical Relationship To Patient Self Return Phone Number [phone] (Primary) Chief Complaint BREATHING - shortness of breath or sounds breathless Reason for Call Symptomatic / Request for [place] states she is experiencing shortness of breath, unable to use c-pap machine, wheezing, and right leg rash. Translation No Nurse Assessment Nurse: Toribio Harbour, RN, Joelene Millin Date/Time (Eastern Time): 05/30/2022 10:27:00 AM Confirm and document reason for call. If symptomatic, describe symptoms. ---Caller states he is experiencing shortness of breath, unable to use c-pap machine, wheezing, and right leg rash. His breathing worsened last night. No fever or cough. Does the patient have any new or worsening symptoms? ---Yes Will a triage be completed? ---Yes Related visit to physician within the last 2 weeks? ---No Does the PT have any chronic conditions? (i.e. diabetes, asthma, this includes High risk factors for pregnancy, etc.) ---Yes List chronic conditions. ---Non- Hodgkins Lymphoma Is this a behavioral health or substance abuse call? ---No Guidelines Guideline Title Affirmed Question Affirmed Notes Nurse Date/Time (Eastern Time) Breathing Difficulty Cancer treatment in past six months (or has cancer now) Toribio Harbour, RN, Joelene Millin 05/30/2022 10:29:18 AM  Disp. Time Eilene Ghazi Time) Disposition Final User 05/30/2022 10:21:56 AM Send to Urgent Queue Memory Argue 05/30/2022 10:35:02 AM Go to ED Now Yes Toribio Harbour, RN,  Joelene Millin Final Disposition 05/30/2022 10:35:02 AM Go to ED Now Yes Toribio Harbour, RN, Renea Ee Disagree/Comply Comply Caller Understands Yes PreDisposition Call Doctor Care Advice Given Per Guideline GO TO ED NOW: * You need to be seen in the Emergency Department. * Go to the ED at ___________ [place] now. Drive carefully. BRING MEDICINES: * Bring a list of your current medicines when you go to the Emergency Department (ER). * Bring the pill bottles too. This will help the doctor (or NP/PA) to make certain you are taking the right medicines and the right dose. CARE ADVICE given per Breathing Difficulty (Adult) guideline. Referrals [facility] - ED

## 2022-05-30 NOTE — Telephone Encounter (Signed)
Awaiting triage note, FYI.

## 2022-05-31 ENCOUNTER — Observation Stay (HOSPITAL_COMMUNITY): Payer: Medicare Other

## 2022-05-31 ENCOUNTER — Emergency Department (HOSPITAL_COMMUNITY): Payer: Medicare Other

## 2022-05-31 ENCOUNTER — Inpatient Hospital Stay (HOSPITAL_COMMUNITY)
Admission: EM | Admit: 2022-05-31 | Discharge: 2022-06-04 | DRG: 291 | Disposition: A | Discharge status: Home / Self Care | Payer: Medicare Other | Attending: Internal Medicine | Admitting: Internal Medicine

## 2022-05-31 ENCOUNTER — Other Ambulatory Visit: Payer: Self-pay

## 2022-05-31 ENCOUNTER — Encounter (HOSPITAL_COMMUNITY): Payer: Self-pay | Admitting: Internal Medicine

## 2022-05-31 DIAGNOSIS — I110 Hypertensive heart disease with heart failure: Principal | ICD-10-CM | POA: Diagnosis present

## 2022-05-31 DIAGNOSIS — Z8572 Personal history of non-Hodgkin lymphomas: Secondary | ICD-10-CM

## 2022-05-31 DIAGNOSIS — Z888 Allergy status to other drugs, medicaments and biological substances status: Secondary | ICD-10-CM

## 2022-05-31 DIAGNOSIS — I350 Nonrheumatic aortic (valve) stenosis: Secondary | ICD-10-CM | POA: Diagnosis not present

## 2022-05-31 DIAGNOSIS — Z7982 Long term (current) use of aspirin: Secondary | ICD-10-CM

## 2022-05-31 DIAGNOSIS — I498 Other specified cardiac arrhythmias: Secondary | ICD-10-CM | POA: Diagnosis present

## 2022-05-31 DIAGNOSIS — R008 Other abnormalities of heart beat: Secondary | ICD-10-CM | POA: Diagnosis not present

## 2022-05-31 DIAGNOSIS — G4733 Obstructive sleep apnea (adult) (pediatric): Secondary | ICD-10-CM | POA: Diagnosis present

## 2022-05-31 DIAGNOSIS — Z8546 Personal history of malignant neoplasm of prostate: Secondary | ICD-10-CM

## 2022-05-31 DIAGNOSIS — I5043 Acute on chronic combined systolic (congestive) and diastolic (congestive) heart failure: Secondary | ICD-10-CM | POA: Diagnosis present

## 2022-05-31 DIAGNOSIS — J90 Pleural effusion, not elsewhere classified: Secondary | ICD-10-CM | POA: Diagnosis not present

## 2022-05-31 DIAGNOSIS — Z923 Personal history of irradiation: Secondary | ICD-10-CM

## 2022-05-31 DIAGNOSIS — Z85038 Personal history of other malignant neoplasm of large intestine: Secondary | ICD-10-CM

## 2022-05-31 DIAGNOSIS — I5021 Acute systolic (congestive) heart failure: Secondary | ICD-10-CM | POA: Diagnosis not present

## 2022-05-31 DIAGNOSIS — Z9049 Acquired absence of other specified parts of digestive tract: Secondary | ICD-10-CM

## 2022-05-31 DIAGNOSIS — R52 Pain, unspecified: Secondary | ICD-10-CM

## 2022-05-31 DIAGNOSIS — R682 Dry mouth, unspecified: Secondary | ICD-10-CM | POA: Diagnosis present

## 2022-05-31 DIAGNOSIS — Z87442 Personal history of urinary calculi: Secondary | ICD-10-CM

## 2022-05-31 DIAGNOSIS — R079 Chest pain, unspecified: Secondary | ICD-10-CM | POA: Diagnosis not present

## 2022-05-31 DIAGNOSIS — Z87891 Personal history of nicotine dependence: Secondary | ICD-10-CM | POA: Diagnosis not present

## 2022-05-31 DIAGNOSIS — I509 Heart failure, unspecified: Secondary | ICD-10-CM | POA: Diagnosis present

## 2022-05-31 DIAGNOSIS — E876 Hypokalemia: Secondary | ICD-10-CM | POA: Diagnosis present

## 2022-05-31 DIAGNOSIS — Z9841 Cataract extraction status, right eye: Secondary | ICD-10-CM

## 2022-05-31 DIAGNOSIS — R001 Bradycardia, unspecified: Secondary | ICD-10-CM | POA: Diagnosis present

## 2022-05-31 DIAGNOSIS — Z79899 Other long term (current) drug therapy: Secondary | ICD-10-CM

## 2022-05-31 DIAGNOSIS — E785 Hyperlipidemia, unspecified: Secondary | ICD-10-CM | POA: Diagnosis present

## 2022-05-31 DIAGNOSIS — Z881 Allergy status to other antibiotic agents status: Secondary | ICD-10-CM | POA: Diagnosis not present

## 2022-05-31 DIAGNOSIS — Z91030 Bee allergy status: Secondary | ICD-10-CM | POA: Diagnosis not present

## 2022-05-31 DIAGNOSIS — Z955 Presence of coronary angioplasty implant and graft: Secondary | ICD-10-CM

## 2022-05-31 DIAGNOSIS — Z803 Family history of malignant neoplasm of breast: Secondary | ICD-10-CM

## 2022-05-31 DIAGNOSIS — Z8673 Personal history of transient ischemic attack (TIA), and cerebral infarction without residual deficits: Secondary | ICD-10-CM

## 2022-05-31 DIAGNOSIS — I2510 Atherosclerotic heart disease of native coronary artery without angina pectoris: Secondary | ICD-10-CM | POA: Diagnosis present

## 2022-05-31 DIAGNOSIS — Z811 Family history of alcohol abuse and dependence: Secondary | ICD-10-CM

## 2022-05-31 DIAGNOSIS — R609 Edema, unspecified: Secondary | ICD-10-CM | POA: Diagnosis not present

## 2022-05-31 DIAGNOSIS — I252 Old myocardial infarction: Secondary | ICD-10-CM | POA: Diagnosis not present

## 2022-05-31 DIAGNOSIS — Z9842 Cataract extraction status, left eye: Secondary | ICD-10-CM

## 2022-05-31 DIAGNOSIS — Z86010 Personal history of colonic polyps: Secondary | ICD-10-CM

## 2022-05-31 DIAGNOSIS — R0602 Shortness of breath: Secondary | ICD-10-CM | POA: Diagnosis not present

## 2022-05-31 DIAGNOSIS — Z8249 Family history of ischemic heart disease and other diseases of the circulatory system: Secondary | ICD-10-CM

## 2022-05-31 DIAGNOSIS — Z832 Family history of diseases of the blood and blood-forming organs and certain disorders involving the immune mechanism: Secondary | ICD-10-CM

## 2022-05-31 DIAGNOSIS — I10 Essential (primary) hypertension: Secondary | ICD-10-CM | POA: Diagnosis present

## 2022-05-31 DIAGNOSIS — I255 Ischemic cardiomyopathy: Secondary | ICD-10-CM | POA: Diagnosis present

## 2022-05-31 DIAGNOSIS — Z807 Family history of other malignant neoplasms of lymphoid, hematopoietic and related tissues: Secondary | ICD-10-CM

## 2022-05-31 DIAGNOSIS — G629 Polyneuropathy, unspecified: Secondary | ICD-10-CM | POA: Diagnosis present

## 2022-05-31 LAB — CBC WITH DIFFERENTIAL/PLATELET
Abs Immature Granulocytes: 0.04 10*3/uL (ref 0.00–0.07)
Basophils Absolute: 0 10*3/uL (ref 0.0–0.1)
Basophils Relative: 0 %
Eosinophils Absolute: 0.1 10*3/uL (ref 0.0–0.5)
Eosinophils Relative: 1 %
HCT: 36.9 % — ABNORMAL LOW (ref 39.0–52.0)
Hemoglobin: 12 g/dL — ABNORMAL LOW (ref 13.0–17.0)
Immature Granulocytes: 1 %
Lymphocytes Relative: 10 %
Lymphs Abs: 0.7 10*3/uL (ref 0.7–4.0)
MCH: 32.3 pg (ref 26.0–34.0)
MCHC: 32.5 g/dL (ref 30.0–36.0)
MCV: 99.2 fL (ref 80.0–100.0)
Monocytes Absolute: 0.8 10*3/uL (ref 0.1–1.0)
Monocytes Relative: 11 %
Neutro Abs: 5.5 10*3/uL (ref 1.7–7.7)
Neutrophils Relative %: 77 %
Platelets: 206 10*3/uL (ref 150–400)
RBC: 3.72 MIL/uL — ABNORMAL LOW (ref 4.22–5.81)
RDW: 15.1 % (ref 11.5–15.5)
WBC: 7.1 10*3/uL (ref 4.0–10.5)
nRBC: 0 % (ref 0.0–0.2)

## 2022-05-31 LAB — COMPREHENSIVE METABOLIC PANEL
ALT: 15 U/L (ref 0–44)
AST: 13 U/L — ABNORMAL LOW (ref 15–41)
Albumin: 3.6 g/dL (ref 3.5–5.0)
Alkaline Phosphatase: 68 U/L (ref 38–126)
Anion gap: 10 (ref 5–15)
BUN: 31 mg/dL — ABNORMAL HIGH (ref 8–23)
CO2: 22 mmol/L (ref 22–32)
Calcium: 8.5 mg/dL — ABNORMAL LOW (ref 8.9–10.3)
Chloride: 106 mmol/L (ref 98–111)
Creatinine, Ser: 1.09 mg/dL (ref 0.61–1.24)
GFR, Estimated: >60 mL/min (ref >60)
Glucose, Bld: 96 mg/dL (ref 70–99)
Potassium: 3.4 mmol/L — ABNORMAL LOW (ref 3.5–5.1)
Sodium: 138 mmol/L (ref 135–145)
Total Bilirubin: 1.8 mg/dL — ABNORMAL HIGH (ref 0.3–1.2)
Total Protein: 6.4 g/dL — ABNORMAL LOW (ref 6.5–8.1)

## 2022-05-31 LAB — MAGNESIUM: Magnesium: 1.5 mg/dL — ABNORMAL LOW (ref 1.7–2.4)

## 2022-05-31 LAB — PHOSPHORUS: Phosphorus: 4 mg/dL (ref 2.5–4.6)

## 2022-05-31 LAB — TROPONIN I (HIGH SENSITIVITY)
Troponin I (High Sensitivity): 18 ng/L — ABNORMAL HIGH (ref <18)
Troponin I (High Sensitivity): 17 ng/L (ref <18)

## 2022-05-31 LAB — BRAIN NATRIURETIC PEPTIDE: B Natriuretic Peptide: 1115.8 pg/mL — ABNORMAL HIGH (ref 0.0–100.0)

## 2022-05-31 MED ORDER — CARVEDILOL 3.125 MG PO TABS
3.1250 mg | ORAL_TABLET | Freq: Two times a day (BID) | ORAL | Status: DC
  Administered 2022-05-31 – 2022-06-03 (×5): 3.125 mg via ORAL
  Filled 2022-05-31 (×8): qty 1

## 2022-05-31 MED ORDER — ASPIRIN 81 MG PO TBEC
81.0000 mg | DELAYED_RELEASE_TABLET | Freq: Every day | ORAL | Status: DC
  Administered 2022-06-01 – 2022-06-04 (×4): 81 mg via ORAL
  Filled 2022-05-31 (×4): qty 1

## 2022-05-31 MED ORDER — POTASSIUM CHLORIDE CRYS ER 20 MEQ PO TBCR
40.0000 meq | EXTENDED_RELEASE_TABLET | Freq: Once | ORAL | Status: AC
  Administered 2022-05-31: 40 meq via ORAL
  Filled 2022-05-31: qty 2

## 2022-05-31 MED ORDER — ENOXAPARIN SODIUM 40 MG/0.4ML IJ SOSY
40.0000 mg | PREFILLED_SYRINGE | INTRAMUSCULAR | Status: DC
  Administered 2022-05-31 – 2022-06-03 (×4): 40 mg via SUBCUTANEOUS
  Filled 2022-05-31 (×4): qty 0.4

## 2022-05-31 MED ORDER — ACETAMINOPHEN 650 MG RE SUPP: 650.0000 mg | Freq: Four times a day (QID) | RECTAL | Status: DC | PRN

## 2022-05-31 MED ORDER — FUROSEMIDE 10 MG/ML IJ SOLN
40.0000 mg | Freq: Once | INTRAMUSCULAR | Status: AC
  Administered 2022-05-31: 40 mg via INTRAVENOUS
  Filled 2022-05-31: qty 4

## 2022-05-31 MED ORDER — FUROSEMIDE 10 MG/ML IJ SOLN
20.0000 mg | Freq: Two times a day (BID) | INTRAMUSCULAR | Status: DC
  Administered 2022-05-31 – 2022-06-03 (×6): 20 mg via INTRAVENOUS
  Filled 2022-05-31 (×6): qty 2

## 2022-05-31 MED ORDER — ACETAMINOPHEN 325 MG PO TABS
650.0000 mg | ORAL_TABLET | Freq: Four times a day (QID) | ORAL | Status: DC | PRN
  Administered 2022-06-01 – 2022-06-02 (×2): 650 mg via ORAL
  Filled 2022-05-31 (×2): qty 2

## 2022-05-31 MED ORDER — SPIRONOLACTONE 25 MG PO TABS
25.0000 mg | ORAL_TABLET | Freq: Every day | ORAL | Status: DC
  Administered 2022-06-01 – 2022-06-04 (×4): 25 mg via ORAL
  Filled 2022-05-31 (×4): qty 1

## 2022-05-31 MED ORDER — POLYETHYLENE GLYCOL 3350 17 G PO PACK
17.0000 g | PACK | ORAL | Status: DC
  Administered 2022-06-01: 17 g via ORAL
  Filled 2022-05-31 (×2): qty 1

## 2022-05-31 MED ORDER — ATORVASTATIN CALCIUM 10 MG PO TABS
10.0000 mg | ORAL_TABLET | ORAL | Status: DC
  Administered 2022-06-01 – 2022-06-03 (×2): 10 mg via ORAL
  Filled 2022-05-31 (×3): qty 1

## 2022-05-31 MED ORDER — NITROGLYCERIN 0.4 MG SL SUBL: 0.4000 mg | SUBLINGUAL_TABLET | SUBLINGUAL | Status: DC | PRN

## 2022-05-31 MED ORDER — POTASSIUM CHLORIDE CRYS ER 20 MEQ PO TBCR
20.0000 meq | EXTENDED_RELEASE_TABLET | Freq: Two times a day (BID) | ORAL | Status: DC
  Administered 2022-06-01 – 2022-06-04 (×7): 20 meq via ORAL
  Filled 2022-05-31 (×7): qty 1

## 2022-05-31 MED ORDER — ONDANSETRON HCL 4 MG PO TABS: 4.0000 mg | ORAL_TABLET | Freq: Four times a day (QID) | ORAL | Status: DC | PRN

## 2022-05-31 MED ORDER — DOCUSATE SODIUM 100 MG PO CAPS
100.0000 mg | ORAL_CAPSULE | Freq: Two times a day (BID) | ORAL | Status: DC
  Administered 2022-05-31 – 2022-06-04 (×8): 100 mg via ORAL
  Filled 2022-05-31 (×8): qty 1

## 2022-05-31 MED ORDER — ASPIRIN 81 MG PO CHEW
324.0000 mg | CHEWABLE_TABLET | Freq: Once | ORAL | Status: AC
  Administered 2022-05-31: 324 mg via ORAL
  Filled 2022-05-31: qty 4

## 2022-05-31 MED ORDER — ONDANSETRON HCL 4 MG/2ML IJ SOLN: 4.0000 mg | Freq: Four times a day (QID) | INTRAMUSCULAR | Status: DC | PRN

## 2022-05-31 MED ORDER — MAGNESIUM SULFATE 2 GM/50ML IV SOLN
2.0000 g | Freq: Once | INTRAVENOUS | Status: AC
  Administered 2022-05-31: 2 g via INTRAVENOUS
  Filled 2022-05-31: qty 50

## 2022-05-31 MED ORDER — LOSARTAN POTASSIUM 50 MG PO TABS: 100.0000 mg | ORAL_TABLET | Freq: Every day | ORAL | Status: DC

## 2022-05-31 MED ORDER — ENOXAPARIN SODIUM 40 MG/0.4ML IJ SOSY: 40.0000 mg | PREFILLED_SYRINGE | INTRAMUSCULAR | Status: DC

## 2022-05-31 MED ORDER — VITAMIN B-12 1000 MCG PO TABS
5000.0000 ug | ORAL_TABLET | Freq: Every morning | ORAL | Status: DC
  Administered 2022-06-01 – 2022-06-04 (×4): 5000 ug via ORAL
  Filled 2022-05-31 (×4): qty 5

## 2022-05-31 MED ORDER — ORAL CARE MOUTH RINSE: 15.0000 mL | OROMUCOSAL | Status: DC | PRN

## 2022-05-31 MED ORDER — AMLODIPINE BESYLATE 10 MG PO TABS
5.0000 mg | ORAL_TABLET | Freq: Every day | ORAL | Status: DC
  Administered 2022-06-01 – 2022-06-04 (×4): 5 mg via ORAL
  Filled 2022-05-31 (×4): qty 1

## 2022-05-31 NOTE — ED Provider Notes (Signed)
[place] EMERGENCY DEPARTMENT AT [facility] Provider Note   CSN: [id number] Arrival date & time: 05/31/22  [id number]     History  Chief Complaint  Patient presents with   Shortness of Breath    Jose Ware is a 88 y.o. male.  Patient with a history of non-Hodgkin's lymphoma, previous MI, previous CVA, heart failure with reduced ejection fraction, hypertension presenting with 2 to 3 days of worsening shortness of breath that is intermittent and worse with exertion.  States it comes and goes lasting for a few minutes to hours at a time not necessarily positional or exertional.  Called the doctor who told him to come to the ED for an EKG.  They are trying to hold off until Monday but he developed some intermittent central chest pain since last night that comes and goes.  Pain is in the center of his chest without radiation to his arms, neck or back.  No associated nausea or vomiting or diaphoresis.  Some associated shortness of breath.  Does have a history of MI with 2 stents.  Denies abdominal pain, vomiting or diarrhea.  Increased leg swelling compared to baseline baseline right greater than left.  Does not take any blood thinners. Denies any pain with urination or blood in the urine.  The history is provided by the patient.  Shortness of Breath Associated symptoms: chest pain   Associated symptoms: no abdominal pain, no fever, no headaches, no rash and no vomiting        Home Medications Prior to Admission medications   Medication Sig Start Date End Date Taking? Authorizing Provider  amLODipine (NORVASC) 5 MG tablet Take 1 tablet by mouth once daily Patient taking differently: Take 5 mg by mouth daily. 03/11/22   [name], MD  aspirin EC 81 MG tablet Take 1 tablet (81 mg total) by mouth daily. 11/16/15   Martinique, Peter M, MD  atorvastatin (LIPITOR) 10 MG tablet Take 1 tablet (10 mg total) by mouth every other day. 01/10/22   [name], MD  carvedilol  (COREG) 3.125 MG tablet Take 1 tablet by mouth twice daily Patient taking differently: Take 3.125 mg by mouth 2 (two) times daily. 02/21/22   Martinique, Peter M, MD  Cyanocobalamin (VITAMIN B-12) 5000 MCG TBDP Take 5,000 mcg by mouth every morning.    [provider]  dicyclomine (BENTYL) 20 MG tablet Take 1 tablet (20 mg total) by mouth 3 (three) times daily as needed for spasms. 03/29/22   Allie Bossier, MD  losartan (COZAAR) 100 MG tablet Take 1 tablet by mouth once daily Patient taking differently: Take 100 mg by mouth daily. 07/22/21   Martinique, Peter M, MD  Multiple Vitamins-Minerals (OCUVITE ADULT 50+ PO) Take 1 tablet by mouth daily. 03/11/19   [provider]  naphazoline-pheniramine (NAPHCON-A) 0.025-0.3 % ophthalmic solution Place 2 drops into both eyes 4 (four) times daily as needed for eye irritation or allergies. 03/29/22   Allie Bossier, MD  nitroGLYCERIN (NITROSTAT) 0.4 MG SL tablet Place 1 tablet (0.4 mg total) under the tongue every 5 (five) minutes x 3 doses as needed for chest pain. 12/27/20   [name], MD  phenol (CHLORASEPTIC) 1.4 % LIQD Use as directed 1 spray in the mouth or throat as needed for throat irritation / pain. 03/29/22   Allie Bossier, MD  polyethylene glycol (MIRALAX / GLYCOLAX) 17 g packet Take 17 g by mouth daily. 03/30/22   Dia Crawford  J, MD  PREVIDENT 5000 DRY MOUTH 1.1 % GEL dental gel Place 1 Application onto teeth at bedtime. 11/20/21   [provider]  senna-docusate (SENOKOT-S) 8.6-50 MG tablet Take 1 tablet by mouth 2 (two) times daily. 03/29/22   Allie Bossier, MD  spironolactone (ALDACTONE) 25 MG tablet Take 25 mg by mouth daily. Per cardiology    [provider]      Allergies    Bee venom, Betadine [povidone iodine], and Doxycycline    Review of Systems   Review of Systems  Constitutional:  Negative for activity change, appetite change and fever.  HENT:  Negative for congestion and rhinorrhea.    Respiratory:  Positive for chest tightness and shortness of breath.   Cardiovascular:  Positive for chest pain and leg swelling.  Gastrointestinal:  Negative for abdominal pain, nausea and vomiting.  Genitourinary:  Negative for dysuria and hematuria.  Musculoskeletal:  Negative for arthralgias and myalgias.  Skin:  Negative for rash.  Neurological:  Positive for weakness. Negative for dizziness, light-headedness and headaches.   all other systems are negative except as noted in the HPI and PMH.    Physical Exam Updated Vital Signs BP (!) 165/74 (BP Location: Right Arm)   Pulse (!) 35   Temp (!) 97.4 F (36.3 C) (Axillary)   Resp 16   Ht 6\' 1"  (1.854 m)   Wt 83 kg   SpO2 97%   BMI 24.14 kg/m  Physical Exam Vitals and nursing note reviewed.  Constitutional:      General: He is not in acute distress.    Appearance: He is well-developed.     Comments: Dyspneic with conversation  HENT:     Head: Normocephalic and atraumatic.     Mouth/Throat:     Pharynx: No oropharyngeal exudate.  Eyes:     Conjunctiva/sclera: Conjunctivae normal.     Pupils: Pupils are equal, round, and reactive to light.  Neck:     Comments: No meningismus. Cardiovascular:     Rate and Rhythm: Normal rate and regular rhythm.     Heart sounds: Normal heart sounds. No murmur heard.    Comments: Bigeminy Pulmonary:     Effort: Pulmonary effort is normal. No respiratory distress.     Breath sounds: Rales present.  Abdominal:     Palpations: Abdomen is soft.     Tenderness: There is no abdominal tenderness. There is no guarding or rebound.  Musculoskeletal:        General: No tenderness. Normal range of motion.     Cervical back: Normal range of motion and neck supple.     Right lower leg: Edema present.     Left lower leg: Edema present.     Comments: +3 edema to knees bilaterally  Skin:    General: Skin is warm.  Neurological:     Mental Status: He is alert and oriented to person, place, and  time.     Cranial Nerves: No cranial nerve deficit.     Motor: No abnormal muscle tone.     Coordination: Coordination normal.     Comments:  5/5 strength throughout. CN 2-12 intact.Equal grip strength.   Psychiatric:        Behavior: Behavior normal.     ED Results / Procedures / Treatments   Labs (all labs ordered are listed, but only abnormal results are displayed) Labs Reviewed  CBC WITH DIFFERENTIAL/PLATELET - Abnormal; Notable for the following components:      Result Value  RBC 3.72 (*)    Hemoglobin 12.0 (*)    HCT 36.9 (*)    All other components within normal limits  COMPREHENSIVE METABOLIC PANEL - Abnormal; Notable for the following components:   Potassium 3.4 (*)    BUN 31 (*)    Calcium 8.5 (*)    Total Protein 6.4 (*)    AST 13 (*)    Total Bilirubin 1.8 (*)    All other components within normal limits  BRAIN NATRIURETIC PEPTIDE - Abnormal; Notable for the following components:   B Natriuretic Peptide 1,115.8 (*)    All other components within normal limits  TROPONIN I (HIGH SENSITIVITY) - Abnormal; Notable for the following components:   Troponin I (High Sensitivity) 18 (*)    All other components within normal limits  TROPONIN I (HIGH SENSITIVITY)    EKG EKG Interpretation  Date/Time:  Saturday May 31 2022 09:24:10 EDT Ventricular Rate:  86 PR Interval:  185 QRS Duration: 102 QT Interval:  411 QTC Calculation: 364 R Axis:   13 Text Interpretation: Sinus rhythm Ventricular bigeminy Inferior infarct, old Lateral leads are also involved now bigeminy Confirmed by Ezequiel Essex [phone]) on 05/31/2022 9:25:52 AM  Radiology DG Chest 2 View  Result Date: 05/31/2022 CLINICAL DATA:  Chest pain and shortness of breath beginning yesterday. EXAM: CHEST - 2 VIEW COMPARISON:  11/04/2015 FINDINGS: The heart size and mediastinal contours are within normal limits. Aortic atherosclerotic calcification incidentally noted. Small pleural effusions are seen, left  side greater than right. Diffuse interstitial infiltrates are new and suspicious for interstitial edema. IMPRESSION: New diffuse interstitial infiltrates, suspicious for interstitial edema. Small pleural effusions, left side greater than right. Electronically Signed   By: Marlaine Hind M.D.   On: 05/31/2022 10:10    Procedures Procedures    Medications Ordered in ED Medications - No data to display  ED Course/ Medical Decision Making/ A&P                             Medical Decision Making Amount and/or Complexity of Data Reviewed Independent Historian: spouse Labs: ordered. Decision-making details documented in ED Course. Radiology: ordered and independent interpretation performed. Decision-making details documented in ED Course. ECG/medicine tests: ordered and independent interpretation performed. Decision-making details documented in ED Course.  Risk OTC drugs. Prescription drug management. Decision regarding hospitalization.   Intermittent shortness of breath for the past 2 to 3 days.  Chest tightness since this morning that comes and goes.  EKG shows bigeminy without acute ST changes.  No hypoxia on room air but does appear short of breath.  Suspect likely CHF exacerbation.  Does have significant leg swelling.  Last echocardiogram was nondiagnostic for ejection fraction but appeared to be reduced.  He take spironolactone at home.  EKG and pattern of bigeminy.  Chest x-ray concerning for bilateral lateral pleural effusions and interstitial edema.  IV Lasix was given.  Patient does have new oxygen requirement into the mid 80s at rest requiring 4 L of oxygen.  Labs show stable hemoglobin and creatinine.  Troponin minimally elevated at 18 with low suspicion for ACS.  BNP 1100.  Patient will require admission for IV diuresis.  Lower concern for DVT and PE given swollen legs as well as pulmonary edema.  Patient agreeable to admission.  Discussed with Dr. Olevia Bowens.  Doppler ultrasounds  of legs ordered.       Final Clinical Impression(s) / ED Diagnoses Final diagnoses:  Acute on chronic combined systolic and diastolic congestive heart failure [facility])    Rx / DC Orders ED Discharge Orders     None         Asuka Dusseau, Annie Main, MD 05/31/22 1233

## 2022-05-31 NOTE — ED Triage Notes (Signed)
Pt c/o sob x few days and chest pain that comes and goes.

## 2022-05-31 NOTE — Progress Notes (Signed)
Patient refused CPAP for tonight. Encouraged patient to call if he changes his mind

## 2022-05-31 NOTE — Progress Notes (Signed)
Lower extremity venous bilateral study completed.  Preliminary results relayed to Olevia Bowens, MD.   See CV Proc for preliminary results report.   Darlin Coco, RDMS, RVT

## 2022-05-31 NOTE — ED Notes (Signed)
ED TO INPATIENT HANDOFF REPORT  ED Nurse Name and Phone #: Clarise [phone]  S Name/Age/Gender Jose Ware 88 y.o. male Room/Bed: WA10/WA10  Code Status   Code Status: Prior  Home/SNF/Other Patient oriented to: self, place, time, and situation Is this baseline? Yes   Triage Complete: Triage complete  Chief Complaint Acute on chronic combined systolic and diastolic congestive heart failure ([place]) [I50.43]  Triage Note Pt c/o sob x few days and chest pain that comes and goes.    Allergies Allergies  Allergen Reactions   Bee Venom Swelling    Facial swelling   Betadine [Povidone Iodine] Other (See Comments)    blistering   Doxycycline     Possible drug rash- back of right lower leg and onto left lower leg as well    Level of Care/Admitting Diagnosis ED Disposition     ED Disposition  Admit   Condition  --   Comment  Hospital Area: [place] [[id number]]  Level of Care: Progressive [102]  Admit to Progressive based on following criteria: CARDIOVASCULAR & THORACIC of moderate stability with acute coronary syndrome symptoms/low risk myocardial infarction/hypertensive urgency/arrhythmias/heart failure potentially compromising stability and stable post cardiovascular intervention patients.  May place patient in observation at [facility] or [facility] if equivalent level of care is available:: No  Covid Evaluation: Asymptomatic - no recent exposure (last 10 days) testing not required  Diagnosis: Acute on chronic combined systolic and diastolic congestive heart failure [facility]) [id number]  Admitting Physician: Reubin Milan [id number]  Attending Physician: Reubin Milan [id number]          B Medical/Surgery History Past Medical History:  Diagnosis Date   Adenocarcinoma of prostate ([place])    XRT & Radiation seed implantation in 2010   Adenomatous colon polyp    CAD (coronary artery disease)    a. 11/04/15:  Acute inferolateral STEMI: S/p  emergent DES of a very large LCx with extensive thrombus. Significant residual disease in the proximal LAD and distal RCA. S/p staged PCI of LAD and RCA 8/29.   Cataract    CVA (cerebral infarction)    a. 123456: embolic CVA after heart cath    CVA (cerebral infarction)    a. 123456: embolic CVA after heart cath    DIVERTICULITIS, HX OF 11/27/2006        DJD (degenerative joint disease)    wrist - R    Hernia    History of blood transfusion 1985   with colon surgery   History of kidney stones    Hypertension    Ischemic cardiomyopathy    a. EF 25-30% by LV gram on cath 11/04/15. Appeared out of proportion to infarct although infarct was large. EF improved by Echo and now 40-45%.   Myocardial infarction ([place]) 2019   NEPHROLITHIASIS, HX OF 11/27/2006         Non Hodgkin's lymphoma ([place]) 11/2020   watching at this point, in abdomen, no meds or chemo yet as of 01-27-2022   Peripheral neuropathy    pt denies   Pneumonia    as a child   Psoriasis    Sleep apnea    wears cpap   Stroke [facility])    no residuals   Tenosynovitis    Past Surgical History:  Procedure Laterality Date   APPENDECTOMY  03/11/1983   BACK SURGERY  03/10/1978   minor disk surgery: instrumentation placed and removed in a second procedure   CARDIAC CATHETERIZATION N/A 11/04/2015  Procedure: Left Heart Cath and Coronary Angiography;  Surgeon: Peter M Martinique, MD;  Location: [place] CV LAB;  Service: Cardiovascular;  Laterality: N/A;   CARDIAC CATHETERIZATION N/A 11/04/2015   Procedure: Coronary Stent Intervention;  Surgeon: Peter M Martinique, MD;  Location: [place] CV LAB;  Service: Cardiovascular;  Laterality: N/A;   CARDIAC CATHETERIZATION N/A 11/06/2015   Procedure: Coronary Stent Intervention;  Surgeon: Leonie Man, MD;  Location: [place] CV LAB;  Service: Cardiovascular;  Laterality: N/A;   CATARACT EXTRACTION, BILATERAL  03/11/2011   CHOLECYSTECTOMY  03/10/1984   COLON SURGERY  11/09/1983   pt.  had ileus, had colon surgery, requiring colostomy & then reversal & then dehisence of that wound & return to OR for repair & cholecystectomy     COLONOSCOPY  2019   COLONOSCOPY WITH PROPOFOL N/A 03/13/2022   Procedure: COLONOSCOPY WITH PROPOFOL;  Surgeon: Ladene Artist, MD;  Location: [facility];  Service: Gastroenterology;  Laterality: N/A;   COLOSTOMY  03/11/1983   After colectomy for diverticulitis, pt. remarks during this surgery they "gave me the paddles two times  because of bleeding", pt. states it was unrelated to any anesthesia complication   EYE SURGERY     /w IOL   KNEE ARTHROSCOPY Left 03/10/2001   KNEE SURGERY Left 03/10/1954   Left 1956, Right w/cartilage removed later   PARATHYROIDECTOMY N/A 05/12/2014   Procedure: PARATHYROIDECTOMY;  Surgeon: Armandina Gemma, MD;  Location: [facility];  Service: General;  Laterality: N/A;   POLYPECTOMY     POLYPECTOMY  03/13/2022   Procedure: POLYPECTOMY;  Surgeon: Ladene Artist, MD;  Location: [facility];  Service: Gastroenterology;;   REVERSAL OF COLOSTOMY  03/10/1985   TONSILLECTOMY  03/10/1944   WRIST SURGERY Left    Remote complicated w/infection     A IV Location/Drains/Wounds Patient Lines/Drains/Airways Status     Active Line/Drains/Airways     Name Placement date Placement time Site Days   Peripheral IV 05/31/22 20 G Anterior;Right Forearm 05/31/22  0953  Forearm  less than 1            Intake/Output Last 24 hours No intake or output data in the 24 hours ending 05/31/22 1234  Labs/Imaging Results for orders placed or performed during the hospital encounter of 05/31/22 (from the past 48 hour(s))  CBC with Differential     Status: Abnormal   Collection Time: 05/31/22  9:50 AM  Result Value Ref Range   WBC 7.1 4.0 - 10.5 K/uL   RBC 3.72 (L) 4.22 - 5.81 MIL/uL   Hemoglobin 12.0 (L) 13.0 - 17.0 g/dL   HCT 36.9 (L) 39.0 - 52.0 %   MCV 99.2 80.0 - 100.0 fL   MCH 32.3 26.0 - 34.0 pg   MCHC 32.5 30.0 - 36.0 g/dL   RDW  15.1 11.5 - 15.5 %   Platelets 206 150 - 400 K/uL   nRBC 0.0 0.0 - 0.2 %   Neutrophils Relative % 77 %   Neutro Abs 5.5 1.7 - 7.7 K/uL   Lymphocytes Relative 10 %   Lymphs Abs 0.7 0.7 - 4.0 K/uL   Monocytes Relative 11 %   Monocytes Absolute 0.8 0.1 - 1.0 K/uL   Eosinophils Relative 1 %   Eosinophils Absolute 0.1 0.0 - 0.5 K/uL   Basophils Relative 0 %   Basophils Absolute 0.0 0.0 - 0.1 K/uL   Immature Granulocytes 1 %   Abs Immature Granulocytes 0.04 0.00 - 0.07 K/uL  Comment: Performed at [facility], [place] [address] [place], [place] [address]  Comprehensive metabolic panel     Status: Abnormal   Collection Time: 05/31/22  9:50 AM  Result Value Ref Range   Sodium 138 135 - 145 mmol/L   Potassium 3.4 (L) 3.5 - 5.1 mmol/L   Chloride 106 98 - 111 mmol/L   CO2 22 22 - 32 mmol/L   Glucose, Bld 96 70 - 99 mg/dL    Comment: Glucose reference range applies only to samples taken after fasting for at least 8 hours.   BUN 31 (H) 8 - 23 mg/dL   Creatinine, Ser 1.09 0.61 - 1.24 mg/dL   Calcium 8.5 (L) 8.9 - 10.3 mg/dL   Total Protein 6.4 (L) 6.5 - 8.1 g/dL   Albumin 3.6 3.5 - 5.0 g/dL   AST 13 (L) 15 - 41 U/L   ALT 15 0 - 44 U/L   Alkaline Phosphatase 68 38 - 126 U/L   Total Bilirubin 1.8 (H) 0.3 - 1.2 mg/dL   GFR, Estimated >60 >60 mL/min    Comment: (NOTE) Calculated using the CKD-EPI Creatinine Equation (2021)    Anion gap 10 5 - 15    Comment: Performed at [facility], [place] [address], [place], [place] [address]  Troponin I (High Sensitivity)     Status: Abnormal   Collection Time: 05/31/22  9:50 AM  Result Value Ref Range   Troponin I (High Sensitivity) 18 (H) <18 ng/L    Comment: (NOTE) Elevated high sensitivity troponin I (hsTnI) values and significant  changes across serial measurements may suggest ACS but many other  chronic and acute conditions are known to elevate hsTnI results.  Refer to the "Links" section for chest pain  algorithms and additional  guidance. Performed at [facility], [place] [address] [place], [place] [address]   Brain natriuretic peptide     Status: Abnormal   Collection Time: 05/31/22  9:50 AM  Result Value Ref Range   B Natriuretic Peptide 1,115.8 (H) 0.0 - 100.0 pg/mL    Comment: Performed at [facility], [place] [address], [place],  [address]   DG Chest 2 View  Result Date: 05/31/2022 CLINICAL DATA:  Chest pain and shortness of breath beginning yesterday. EXAM: CHEST - 2 VIEW COMPARISON:  11/04/2015 FINDINGS: The heart size and mediastinal contours are within normal limits. Aortic atherosclerotic calcification incidentally noted. Small pleural effusions are seen, left side greater than right. Diffuse interstitial infiltrates are new and suspicious for interstitial edema. IMPRESSION: New diffuse interstitial infiltrates, suspicious for interstitial edema. Small pleural effusions, left side greater than right. Electronically Signed   By: Marlaine Hind M.D.   On: 05/31/2022 10:10    Pending Labs Unresulted Labs (From admission, onward)    None       Vitals/Pain Today's Vitals   05/31/22 0950 05/31/22 1011 05/31/22 1030 05/31/22 1200  BP:   (!) 147/66 (!) 157/69  Pulse: (!) 32 (!) 32 (!) 31 (!) 31  Resp: (!) 22 (!) 28 (!) 24 19  Temp:      TempSrc:      SpO2: (!) 86% 94% 93% 96%  Weight:      Height:        Isolation Precautions No active isolations  Medications Medications  aspirin chewable tablet 324 mg (324 mg Oral Given 05/31/22 1007)  furosemide (LASIX) injection 40 mg (40 mg Intravenous Given 05/31/22 1046)    Mobility  walks with device     Focused Assessments   R Recommendations: See Admitting Provider Note  Report given to:   Additional Notes:

## 2022-05-31 NOTE — H&P (Signed)
History and Physical    Patient: Jose Ware [id number] DOB: 07-31-1934 DOA: 05/31/2022 DOS: the patient was seen and examined on 05/31/2022 PCP: [name], MD  Patient coming from: Home  Chief Complaint:  Chief Complaint  Patient presents with   Shortness of Breath   HPI: Jose Ware is a 88 y.o. male with medical history significant of colon adenocarcinoma, CAD, history of STEMI, stent placement, ischemic cardiomyopathy, chronic combined systolic and diastolic heart failure, CVA, diverticulosis/diverticulitis, DJD, nephrolithiasis, hypertension, non-Hodgkin's lymphoma, peripheral neuropathy, history of pneumonia, psoriasis, sleep apnea on CPAP, history of CVA who comes to the emergency department with progressively worse dyspnea associated with pressure-like chest pain, orthopnea and lower extremity edema for the past few days.  He was his birthday recently so he thinks he may have injured which more than usual.  He ate chocolate cake, but he has also been eating potato chips.   Lab work: CBC shows a white count 7.1, hemoglobin 12.0 g/dL platelets 206.  Troponin was 18 and then 17 ng/L.  BNP 1116 pg/mL.  Phosphorus 4.0 magnesium 1.5 mg/dL.  CMP showed a phosphorus of 3.4 mmol/L, but had normal electrolytes after calcium correction.  Total protein 6.4, AST 13 and total bilirubin 1.8.  The rest of the LFTs were normal.  Imaging: 2 view chest radiograph with new diffuse interstitial infiltrates suspicious for edema.  Small pleural effusions, left greater than right.  ED course: Initial vital signs were temperature 97.4 F, pulse 85, respirations 16, BP 165/74 mmHg O2 sat 97% on room air.  The patient received furosemide 40 mg IVP and 324 mg of aspirin.  I added potassium and magnesium supplementation.   Review of Systems: As mentioned in the history of present illness. All other systems reviewed and are negative.  Past Medical History:  Diagnosis Date   Adenocarcinoma  of prostate [facility])    XRT & Radiation seed implantation in 2010   Adenomatous colon polyp    CAD (coronary artery disease)    a. 11/04/15:  Acute inferolateral STEMI: S/p emergent DES of a very large LCx with extensive thrombus. Significant residual disease in the proximal LAD and distal RCA. S/p staged PCI of LAD and RCA 8/29.   Cataract    CVA (cerebral infarction)    a. 123456: embolic CVA after heart cath    CVA (cerebral infarction)    a. 123456: embolic CVA after heart cath    DIVERTICULITIS, HX OF 11/27/2006        DJD (degenerative joint disease)    wrist - R    Hernia    History of blood transfusion 1985   with colon surgery   History of kidney stones    Hypertension    Ischemic cardiomyopathy    a. EF 25-30% by LV gram on cath 11/04/15. Appeared out of proportion to infarct although infarct was large. EF improved by Echo and now 40-45%.   Myocardial infarction ([place]) 2019   NEPHROLITHIASIS, HX OF 11/27/2006         Non Hodgkin's lymphoma ([place]) 11/2020   watching at this point, in abdomen, no meds or chemo yet as of 01-27-2022   Peripheral neuropathy    pt denies   Pneumonia    as a child   Psoriasis    Sleep apnea    wears cpap   Stroke ([place])    no residuals   Tenosynovitis    Past Surgical History:  Procedure Laterality Date   APPENDECTOMY  03/11/1983   BACK SURGERY  03/10/1978   minor disk surgery: instrumentation placed and removed in a second procedure   CARDIAC CATHETERIZATION N/A 11/04/2015   Procedure: Left Heart Cath and Coronary Angiography;  Surgeon: Peter M Martinique, MD;  Location: [place] [facility];  Service: Cardiovascular;  Laterality: N/A;   CARDIAC CATHETERIZATION N/A 11/04/2015   Procedure: Coronary Stent Intervention;  Surgeon: Peter M Martinique, MD;  Location: [place] [facility];  Service: Cardiovascular;  Laterality: N/A;   CARDIAC CATHETERIZATION N/A 11/06/2015   Procedure: Coronary Stent Intervention;  Surgeon: Leonie Man, MD;  Location: [place] [facility];  Service: Cardiovascular;  Laterality: N/A;   CATARACT EXTRACTION, BILATERAL  03/11/2011   CHOLECYSTECTOMY  03/10/1984   COLON SURGERY  11/09/1983   pt. had ileus, had colon surgery, requiring colostomy & then reversal & then dehisence of that wound & return to OR for repair & cholecystectomy     COLONOSCOPY  06/28/17   COLONOSCOPY WITH PROPOFOL N/A 03/13/2022   Procedure: COLONOSCOPY WITH PROPOFOL;  Surgeon: Ladene Artist, MD;  Location: [facility];  Service: Gastroenterology;  Laterality: N/A;   COLOSTOMY  03/11/1983   After colectomy for diverticulitis, pt. remarks during this surgery they "gave me the paddles two times  because of bleeding", pt. states it was unrelated to any anesthesia complication   EYE SURGERY     /w IOL   KNEE ARTHROSCOPY Left 03/10/2001   KNEE SURGERY Left 03/10/1954   Left June 29, 1954, Right w/cartilage removed later   PARATHYROIDECTOMY N/A 05/12/2014   Procedure: PARATHYROIDECTOMY;  Surgeon: Armandina Gemma, MD;  Location: [place];  Service: General;  Laterality: N/A;   POLYPECTOMY     POLYPECTOMY  03/13/2022   Procedure: POLYPECTOMY;  Surgeon: Ladene Artist, MD;  Location: [facility];  Service: Gastroenterology;;   REVERSAL OF COLOSTOMY  03/10/1985   TONSILLECTOMY  03/10/1944   WRIST SURGERY Left    Remote complicated w/infection   Social History:  reports that he quit smoking about 39 years ago. His smoking use included cigarettes. He has a 60.00 pack-year smoking history. He has never used smokeless tobacco. He reports that he does not currently use alcohol. He reports that he does not use drugs.  Allergies  Allergen Reactions   Bee Venom Swelling    Facial swelling   Betadine [Povidone Iodine] Other (See Comments)    blistering   Doxycycline     Possible drug rash- back of right lower leg and onto left lower leg as well   Family History  Problem Relation Age of Onset   Coronary artery disease Mother    Alcohol abuse Father    Cirrhosis  Father    Heart failure Sister    Breast cancer Sister        W/involvement of right arm leading to amputation   Anemia Sister        related to treatment to lymphoma   Non-Hodgkin's lymphoma Sister    Other Sister        died from covid 06-28-20   Coronary artery disease Brother    Heart attack Brother    Non-Hodgkin's lymphoma Brother    Clotting disorder Brother    Non-Hodgkin's lymphoma Brother    Prostate cancer Neg Hx    Colon cancer Neg Hx    Diabetes Neg Hx    Glaucoma Neg Hx    Rectal cancer Neg Hx    Esophageal cancer Neg Hx    Colon polyps Neg  Hx    Stomach cancer Neg Hx    Prior to Admission medications   Medication Sig Start Date End Date Taking? Authorizing Provider  acetaminophen (TYLENOL) 650 MG CR tablet Take 650 mg by mouth every 4 (four) hours as needed for pain.   Yes [provider]  amLODipine (NORVASC) 5 MG tablet Take 1 tablet by mouth once daily Patient taking differently: Take 5 mg by mouth daily. 03/11/22  Yes [name], MD  aspirin EC 81 MG tablet Take 1 tablet (81 mg total) by mouth daily. 11/16/15  Yes Martinique, Peter M, MD  atorvastatin (LIPITOR) 10 MG tablet Take 1 tablet (10 mg total) by mouth every other day. 01/10/22  Yes [name], MD  carvedilol (COREG) 3.125 MG tablet Take 1 tablet by mouth twice daily Patient taking differently: Take 3.125 mg by mouth 2 (two) times daily. 02/21/22  Yes Martinique, Peter M, MD  Cyanocobalamin (VITAMIN B-12) 5000 MCG TBDP Take 5,000 mcg by mouth every morning.   Yes [provider]  dicyclomine (BENTYL) 20 MG tablet Take 1 tablet (20 mg total) by mouth 3 (three) times daily as needed for spasms. 03/29/22  Yes Allie Bossier, MD  docusate sodium (COLACE) 100 MG capsule Take 100 mg by mouth 2 (two) times daily.   Yes [provider]  losartan (COZAAR) 100 MG tablet Take 1 tablet by mouth once daily Patient taking differently: Take 100 mg by mouth daily. 07/22/21  Yes Martinique, Peter M, MD   Multiple Vitamins-Minerals (OCUVITE ADULT 50+ PO) Take 1 tablet by mouth daily. 03/11/19  Yes [provider]  naphazoline-pheniramine (NAPHCON-A) 0.025-0.3 % ophthalmic solution Place 2 drops into both eyes 4 (four) times daily as needed for eye irritation or allergies. 03/29/22  Yes Allie Bossier, MD  nitroGLYCERIN (NITROSTAT) 0.4 MG SL tablet Place 1 tablet (0.4 mg total) under the tongue every 5 (five) minutes x 3 doses as needed for chest pain. 12/27/20  Yes [name], MD  phenol (CHLORASEPTIC) 1.4 % LIQD Use as directed 1 spray in the mouth or throat as needed for throat irritation / pain. 03/29/22  Yes Allie Bossier, MD  polyethylene glycol (MIRALAX / GLYCOLAX) 17 g packet Take 17 g by mouth daily. Patient taking differently: Take 17 g by mouth every other day. 03/30/22  Yes Allie Bossier, MD  PREVIDENT 5000 DRY MOUTH 1.1 % GEL dental gel Place 1 Application onto teeth at bedtime. 11/20/21  Yes [provider]  spironolactone (ALDACTONE) 25 MG tablet Take 25 mg by mouth daily. Per cardiology   Yes [provider]  senna-docusate (SENOKOT-S) 8.6-50 MG tablet Take 1 tablet by mouth 2 (two) times daily. Patient not taking: Reported on 05/31/2022 03/29/22   Allie Bossier, MD    Physical Exam: Vitals:   05/31/22 1030 05/31/22 1200 05/31/22 1346 05/31/22 1356  BP: (!) 147/66 (!) 157/69  (!) 163/68  Pulse: (!) 31 (!) 31  (!) 31  Resp: (!) 24 19    Temp:    (!) 97.5 F (36.4 C)  TempSrc:    Oral  SpO2: 93% 96%  99%  Weight:   91.6 kg   Height:   6\' 1"  (1.854 m)    Physical Exam Vitals and nursing note reviewed.  Constitutional:      Appearance: He is well-developed.  HENT:     Head: Normocephalic.     Nose: No rhinorrhea.     Mouth/Throat:  Mouth: Mucous membranes are moist.  Eyes:     General: No scleral icterus.    Pupils: Pupils are equal, round, and reactive to light.  Neck:     Vascular: No JVD.  Cardiovascular:     Rate and Rhythm:  Normal rate. Rhythm irregular. Frequent Extrasystoles are present.    Heart sounds: S1 normal and S2 normal.  Pulmonary:     Effort: Pulmonary effort is normal.     Breath sounds: Normal breath sounds. No wheezing or rhonchi.  Musculoskeletal:     Cervical back: Neck supple.     Right lower leg: 3+ Edema present.     Left lower leg: 3+ Edema present.  Skin:    General: Skin is warm and dry.  Neurological:     General: No focal deficit present.     Mental Status: He is alert and oriented to person, place, and time.  Psychiatric:        Mood and Affect: Mood normal.    Data Reviewed:  Results are pending, will review when available.  11/05/2015  LV EF: 45% -   50%   -------------------------------------------------------------------  Indications:     Cardiomyopathy - ischemic 414.8.   -------------------------------------------------------------------  History:  PMH:   Myocardial infarction.  Risk factors:  Hypertension. Diabetes mellitus. Dyslipidemia.   -------------------------------------------------------------------  Study Conclusions   - Left ventricle: The cavity size was normal. Wall thickness was    increased in a pattern of moderate LVH. Systolic function was    mildly reduced. The estimated ejection fraction was in the range    of 45% to 50%. Inferolateral hypokinesis. Doppler parameters are    consistent with abnormal left ventricular relaxation (grade 1    diastolic dysfunction). The E/e&' ratio is >15, suggesting    elevated LV filling pressure.  - Aortic valve: Trileaflet. Sclerosis without stenosis.  - Mitral valve: Calcified annulus. There was trivial regurgitation.  - Left atrium: The atrium was mildly dilated.  - Inferior vena cava: The vessel was normal in size. The    respirophasic diameter changes were in the normal range (>= 50%),    consistent with normal central venous pressure.   Impressions:   - LVEF 45-50% with moderate LVH and  inferolateral hypokinesis,    diastolic dysfunction with elevated LV filling pressure, aortic    valve sclerosis, trivial MR, mild LAE, normal IVC.   07/2021 echo IMPRESSIONS    1. Poor acoustic windows limit study Difficult to see endocardium. LVEF  is depressed, mild/mild to moderate. Lateral and inferior wall appear  hypokinetic     WOuld reocmm limited echo with Defnity to further define LVEF and wall  motion.. The left ventricular internal cavity size was mildly dilated.  There is mild left ventricular hypertrophy.   2. Right ventricular systolic function is normal. The right ventricular  size is normal.   3. Left atrial size was severely dilated.   4. The mitral valve is normal in structure. Mild mitral valve  regurgitation.   5. AV is thickened, calcified with very mildly restricted motion. Peak  and mean gradients through the valve are 21 and 11 mm Hg respectively..  The aortic valve is tricuspid. Aortic valve regurgitation is not  visualized.   6. Aortic dilatation noted. There is borderline dilatation of the  ascending aorta and of the aortic root, measuring 38 mm. There is mild  dilatation of the aortic root and of the ascending aorta, measuring 39 mm.   7. The inferior  vena cava is normal in size with greater than 50%  respiratory variability, suggesting right atrial pressure of 3 mmHg.   EKG:  Vent. rate 86 BPM PR interval 185 ms QRS duration 102 ms QT/QTcB 411/364 ms P-R-T axes 39 13 14 Sinus rhythm Ventricular bigeminy Inferior infarct, old Lateral leads are also involved  Assessment and Plan: Principal Problem:   Acute on chronic combined systolic  and diastolic congestive heart failure (HCC) Observation/telemetry. Supplemental oxygen as needed. Sodium and fluid restriction. Continue furosemide 20 mg IVP twice daily. Monitor daily weights, intake and output. Monitor renal function and electrolytes. Check echocardiogram in AM.  Active Problems:    Hypokalemia Replacing. Follow potassium level.    Hypomagnesemia Replacement ordered. Follow-up magnesium level.    Bigeminy Continue twice daily carvedilol. Electrolyte correction. Continue cardio monitoring.    Coronary artery disease involving native  coronary artery of native heart without angina pectoris Continue carvedilol, atorvastatin, aspirin and NTG as needed. Also on amlodipine and losartan.    Essential hypertension Continue amlodipine 5 mg p.o. daily. Continue carvedilol 3.125 mg p.o. twice daily. Continue losartan 100 mg p.o. daily. Continue spironolactone 25 mg p.o. daily. Also on furosemide twice daily as above.    OSA on CPAP Continue CPAP at bedtime.    Dyslipidemia Continue atorvastatin 10 mg p.o. daily.     Advance Care Planning:   Code Status: Full Code   Consults:   Family Communication: His wife was at bedside.  Severity of Illness: The appropriate patient status for this patient is OBSERVATION. Observation status is judged to be reasonable and necessary in order to provide the required intensity of service to ensure the patient's safety. The patient's presenting symptoms, physical exam findings, and initial radiographic and laboratory data in the context of their medical condition is felt to place them at decreased risk for further clinical deterioration. Furthermore, it is anticipated that the patient will be medically stable for discharge from the hospital within 2 midnights of admission.   Author: Reubin Milan, MD 05/31/2022 2:17 PM  For on call review [email or web address].   This document was prepared using Dragon voice recognition software and may contain some unintended transcription errors.

## 2022-05-31 NOTE — ED Notes (Signed)
Pt SPO2 dropping into high 80's. Placed on 4L [place]- up to 95%.

## 2022-06-01 ENCOUNTER — Observation Stay (HOSPITAL_COMMUNITY): Payer: Medicare Other

## 2022-06-01 DIAGNOSIS — I2510 Atherosclerotic heart disease of native coronary artery without angina pectoris: Secondary | ICD-10-CM | POA: Diagnosis present

## 2022-06-01 DIAGNOSIS — I5043 Acute on chronic combined systolic (congestive) and diastolic (congestive) heart failure: Secondary | ICD-10-CM | POA: Diagnosis not present

## 2022-06-01 DIAGNOSIS — R008 Other abnormalities of heart beat: Secondary | ICD-10-CM | POA: Diagnosis present

## 2022-06-01 DIAGNOSIS — I252 Old myocardial infarction: Secondary | ICD-10-CM | POA: Diagnosis not present

## 2022-06-01 DIAGNOSIS — Z85038 Personal history of other malignant neoplasm of large intestine: Secondary | ICD-10-CM | POA: Diagnosis not present

## 2022-06-01 DIAGNOSIS — Z881 Allergy status to other antibiotic agents status: Secondary | ICD-10-CM | POA: Diagnosis not present

## 2022-06-01 DIAGNOSIS — G4733 Obstructive sleep apnea (adult) (pediatric): Secondary | ICD-10-CM | POA: Diagnosis present

## 2022-06-01 DIAGNOSIS — I110 Hypertensive heart disease with heart failure: Secondary | ICD-10-CM | POA: Diagnosis present

## 2022-06-01 DIAGNOSIS — Z955 Presence of coronary angioplasty implant and graft: Secondary | ICD-10-CM | POA: Diagnosis not present

## 2022-06-01 DIAGNOSIS — I350 Nonrheumatic aortic (valve) stenosis: Secondary | ICD-10-CM | POA: Diagnosis present

## 2022-06-01 DIAGNOSIS — Z79899 Other long term (current) drug therapy: Secondary | ICD-10-CM | POA: Diagnosis not present

## 2022-06-01 DIAGNOSIS — Z87442 Personal history of urinary calculi: Secondary | ICD-10-CM | POA: Diagnosis not present

## 2022-06-01 DIAGNOSIS — I5021 Acute systolic (congestive) heart failure: Secondary | ICD-10-CM

## 2022-06-01 DIAGNOSIS — Z888 Allergy status to other drugs, medicaments and biological substances status: Secondary | ICD-10-CM | POA: Diagnosis not present

## 2022-06-01 DIAGNOSIS — E876 Hypokalemia: Secondary | ICD-10-CM | POA: Diagnosis present

## 2022-06-01 DIAGNOSIS — Z91030 Bee allergy status: Secondary | ICD-10-CM | POA: Diagnosis not present

## 2022-06-01 DIAGNOSIS — E785 Hyperlipidemia, unspecified: Secondary | ICD-10-CM | POA: Diagnosis present

## 2022-06-01 DIAGNOSIS — I255 Ischemic cardiomyopathy: Secondary | ICD-10-CM | POA: Diagnosis present

## 2022-06-01 DIAGNOSIS — Z8249 Family history of ischemic heart disease and other diseases of the circulatory system: Secondary | ICD-10-CM | POA: Diagnosis not present

## 2022-06-01 DIAGNOSIS — Z8572 Personal history of non-Hodgkin lymphomas: Secondary | ICD-10-CM | POA: Diagnosis not present

## 2022-06-01 DIAGNOSIS — Z87891 Personal history of nicotine dependence: Secondary | ICD-10-CM | POA: Diagnosis not present

## 2022-06-01 DIAGNOSIS — R001 Bradycardia, unspecified: Secondary | ICD-10-CM | POA: Diagnosis present

## 2022-06-01 DIAGNOSIS — Z8673 Personal history of transient ischemic attack (TIA), and cerebral infarction without residual deficits: Secondary | ICD-10-CM | POA: Diagnosis not present

## 2022-06-01 LAB — ECHOCARDIOGRAM COMPLETE
AR max vel: 1.27 cm2
AV Area VTI: 1.41 cm2
AV Area mean vel: 1.4 cm2
AV Mean grad: 9 mmHg
AV Peak grad: 19.8 mmHg
Ao pk vel: 2.22 m/s
Area-P 1/2: 2.62 cm2
Calc EF: 48.9 %
Height: 73 in
S' Lateral: 4.6 cm
Single Plane A2C EF: 45.7 %
Single Plane A4C EF: 51.3 %
Weight: 3234.59 oz

## 2022-06-01 LAB — CBC
HCT: 36.7 % — ABNORMAL LOW (ref 39.0–52.0)
Hemoglobin: 12 g/dL — ABNORMAL LOW (ref 13.0–17.0)
MCH: 32.3 pg (ref 26.0–34.0)
MCHC: 32.7 g/dL (ref 30.0–36.0)
MCV: 98.9 fL (ref 80.0–100.0)
Platelets: 193 10*3/uL (ref 150–400)
RBC: 3.71 MIL/uL — ABNORMAL LOW (ref 4.22–5.81)
RDW: 14.8 % (ref 11.5–15.5)
WBC: 7.7 10*3/uL (ref 4.0–10.5)
nRBC: 0 % (ref 0.0–0.2)

## 2022-06-01 LAB — BASIC METABOLIC PANEL
Anion gap: 10 (ref 5–15)
BUN: 32 mg/dL — ABNORMAL HIGH (ref 8–23)
CO2: 24 mmol/L (ref 22–32)
Calcium: 8.3 mg/dL — ABNORMAL LOW (ref 8.9–10.3)
Chloride: 105 mmol/L (ref 98–111)
Creatinine, Ser: 1.25 mg/dL — ABNORMAL HIGH (ref 0.61–1.24)
GFR, Estimated: 56 mL/min — ABNORMAL LOW (ref >60)
Glucose, Bld: 81 mg/dL (ref 70–99)
Potassium: 3 mmol/L — ABNORMAL LOW (ref 3.5–5.1)
Sodium: 139 mmol/L (ref 135–145)

## 2022-06-01 LAB — HEPATIC FUNCTION PANEL
ALT: 14 U/L (ref 0–44)
AST: 12 U/L — ABNORMAL LOW (ref 15–41)
Albumin: 3.3 g/dL — ABNORMAL LOW (ref 3.5–5.0)
Alkaline Phosphatase: 59 U/L (ref 38–126)
Bilirubin, Direct: 0.2 mg/dL (ref 0.0–0.2)
Indirect Bilirubin: 1.9 mg/dL — ABNORMAL HIGH (ref 0.3–0.9)
Total Bilirubin: 2.1 mg/dL — ABNORMAL HIGH (ref 0.3–1.2)
Total Protein: 6.2 g/dL — ABNORMAL LOW (ref 6.5–8.1)

## 2022-06-01 LAB — MAGNESIUM: Magnesium: 2.1 mg/dL (ref 1.7–2.4)

## 2022-06-01 MED ORDER — POTASSIUM CHLORIDE CRYS ER 20 MEQ PO TBCR
40.0000 meq | EXTENDED_RELEASE_TABLET | Freq: Once | ORAL | Status: AC
  Administered 2022-06-01: 40 meq via ORAL
  Filled 2022-06-01: qty 2

## 2022-06-01 NOTE — Progress Notes (Addendum)
PROGRESS NOTE    Jose Ware  [id number] DOB: [birth year] DOA: 05/31/2022 PCP: [name], MD   Brief Narrative: 88 year old with past medical history significant for colon adenocarcinoma, CAD, history of STEMI, status post stent placement, ischemic cardiomyopathy, chronic combined systolic and diastolic heart failure, CVA, diverticulosis/diverticulitis, DJD, nephrolithiasis, hypertension, non-Hodgkin lymphoma, peripheral neuropathy, history of pneumonia, psoriasis, sleep apnea on CPAP history of CVA who presents to the ED with progressive worsening shortness of breath, associated with chest pressure, orthopnea, lower extremity edema for the last few days prior to admission.  He report diet indiscretion.  He presented with white blood cell 7.1, BMP 1116, chest x-ray diffuse interstitial infiltrates suspicious for edema, small pleural effusion left greater than right.   Patient admitted for heart failure exacerbation.   Assessment & Plan:   Principal Problem:   Acute on chronic combined systolic and diastolic congestive heart failure (HCC) Active Problems:   Hypokalemia   Coronary artery disease involving native coronary artery of native heart without angina pectoris   Essential hypertension   OSA on CPAP   Hypomagnesemia   Dyslipidemia   Bigeminy   1-Acute on Chronic Combined Systolic and Diastolic Heart Failure Exacerbation: -Patient presented with shortness of breath, elevation BNP 1007, chest x-ray with diffuse interstitial edema.  Lower extremity edema. -Report diet indiscretion -Continue with IV Lasix as -Wean off of oxygen as tolerated -Strict I's and O's, daily weight --2.5 L -Weight:201---202 -Continue with spironolactone. Hold ACE due to mildly increase cr.  Continue to monitor creatinine Follow ECHO results.   Hypokalemia: replete orally while on lasix.   Hypomagnesemia:Replaced.   Bigeminy: replete electrolytes.   CAD,; Continue with  carvedilol, Lipitor, as needed nitroglycerin.  Hypertension: Continue with carvedilol, Lasix, Norvasc.  OSA on CPAP  Dyslipidemia: Continue Lipitor.  Hyperbilirubinemia: Setting of heart failure exacerbation  Doppler; preliminary report.  loculated cystic structure is found in the popliteal fossa,  measuring 7.1 x 2.5 x 4.5 cm.  Will await final report.   Estimated body mass index is 26.67 kg/m as calculated from the following:   Height as of this encounter: 6\' 1"  (1.854 m).   Weight as of this encounter: 91.7 kg.   DVT prophylaxis: Continue with Lovenox Code Status: Full code Family Communication: Care discussed with patient Disposition Plan:  Status is: Observation The patient remains OBS appropriate and will d/c before 2 midnights.    Consultants:  None  Procedures:  Echo  Antimicrobials:    Subjective: He is feeling better, shortness of breath has improved.  He still have lower extremity edema  Objective: Vitals:   06/01/22 0234 06/01/22 0500 06/01/22 0525 06/01/22 0623  BP: (!) 151/48   (!) 158/52  Pulse: (!) 54   65  Resp: 20   16  Temp: 97.8 F (36.6 C)   97.8 F (36.6 C)  TempSrc: Oral   Oral  SpO2: 93%  96% 94%  Weight:  91.7 kg    Height:        Intake/Output Summary (Last 24 hours) at 06/01/2022 0726 Last data filed at 05/31/2022 2150 Gross per 24 hour  Intake 50 ml  Output 2750 ml  Net -2700 ml   Filed Weights   05/31/22 0926 05/31/22 1346 06/01/22 0500  Weight: 83 kg 91.6 kg 91.7 kg    Examination:  General exam: Appears calm and comfortable  Respiratory system: Respiratory effort normal.  Bilateral crackles Cardiovascular system: S1 & S2 heard, RRR.  Positive JVD, lower extremity edema Gastrointestinal  system: Abdomen is nondistended, soft and nontender. No organomegaly or masses felt. Normal bowel sounds heard. Central nervous system: Alert and oriented. Extremities: Symmetric 5 x 5 power.   Data Reviewed: I have personally  reviewed following labs and imaging studies  CBC: Recent Labs  Lab 05/31/22 0950 06/01/22 0045  WBC 7.1 7.7  NEUTROABS 5.5  --   HGB 12.0* 12.0*  HCT 36.9* 36.7*  MCV 99.2 98.9  PLT 206 0000000   Basic Metabolic Panel: Recent Labs  Lab 05/31/22 0950 06/01/22 0045  NA 138 139  K 3.4* 3.0*  CL 106 105  CO2 22 24  GLUCOSE 96 81  BUN 31* 32*  CREATININE 1.09 1.25*  CALCIUM 8.5* 8.3*  MG 1.5* 2.1  PHOS 4.0  --    GFR: Estimated Creatinine Clearance: 47.9 mL/min (A) (by C-G formula based on SCr of 1.25 mg/dL (H)). Liver Function Tests: Recent Labs  Lab 05/31/22 0950 06/01/22 0045  AST 13* 12*  ALT 15 14  ALKPHOS 68 59  BILITOT 1.8* 2.1*  PROT 6.4* 6.2*  ALBUMIN 3.6 3.3*   No results for input(s): "LIPASE", "AMYLASE" in the last 168 hours. No results for input(s): "AMMONIA" in the last 168 hours. Coagulation Profile: No results for input(s): "INR", "PROTIME" in the last 168 hours. Cardiac Enzymes: No results for input(s): "CKTOTAL", "CKMB", "CKMBINDEX", "TROPONINI" in the last 168 hours. BNP (last 3 results) No results for input(s): "PROBNP" in the last 8760 hours. HbA1C: No results for input(s): "HGBA1C" in the last 72 hours. CBG: No results for input(s): "GLUCAP" in the last 168 hours. Lipid Profile: No results for input(s): "CHOL", "HDL", "LDLCALC", "TRIG", "CHOLHDL", "LDLDIRECT" in the last 72 hours. Thyroid Function Tests: No results for input(s): "TSH", "T4TOTAL", "FREET4", "T3FREE", "THYROIDAB" in the last 72 hours. Anemia Panel: No results for input(s): "VITAMINB12", "FOLATE", "FERRITIN", "TIBC", "IRON", "RETICCTPCT" in the last 72 hours. Sepsis Labs: No results for input(s): "PROCALCITON", "LATICACIDVEN" in the last 168 hours.  No results found for this or any previous visit (from the past 240 hour(s)).       Radiology Studies: VAS [place] LOWER EXTREMITY VENOUS (DVT) (ONLY [facility] & [facility])  Result Date: 05/31/2022  Lower Venous DVT Study Patient Name:   Jose Ware [facility]  Date of Exam:   05/31/2022 Medical Rec #: [id number]           Accession #:    [id number] Date of Birth: 07-15-1934           Patient Gender: M Patient Age:   75 years Exam Location:  [facility] Procedure:      VAS [place] LOWER EXTREMITY VENOUS (DVT) Referring Phys: Jose Essex --------------------------------------------------------------------------------  Indications: Bilateral pain and edema, RT>LT.  Risk Factors: CHF. Comparison Study: No prior studies. Performing Technologist: Darlin Coco RDMS, RVT  Examination Guidelines: A complete evaluation includes B-mode imaging, spectral Doppler, color Doppler, and power Doppler as needed of all accessible portions of each vessel. Bilateral testing is considered an integral part of a complete examination. Limited examinations for reoccurring indications may be performed as noted. The reflux portion of the exam is performed with the patient in reverse Trendelenburg.  +---------+---------------+---------+-----------+----------+--------------+ RIGHT    CompressibilityPhasicitySpontaneityPropertiesThrombus Aging +---------+---------------+---------+-----------+----------+--------------+ CFV      Full           Yes      Yes                                 +---------+---------------+---------+-----------+----------+--------------+  SFJ      Full                                                        +---------+---------------+---------+-----------+----------+--------------+ FV Prox  Full                                                        +---------+---------------+---------+-----------+----------+--------------+ FV Mid   Full                                                        +---------+---------------+---------+-----------+----------+--------------+ FV DistalFull                                                        +---------+---------------+---------+-----------+----------+--------------+ PFV       Full                                                        +---------+---------------+---------+-----------+----------+--------------+ POP      Full           Yes      Yes                                 +---------+---------------+---------+-----------+----------+--------------+ PTV      Full                                                        +---------+---------------+---------+-----------+----------+--------------+ PERO     Full                                                        +---------+---------------+---------+-----------+----------+--------------+   +---------+---------------+---------+-----------+----------+--------------+ LEFT     CompressibilityPhasicitySpontaneityPropertiesThrombus Aging +---------+---------------+---------+-----------+----------+--------------+ CFV      Full           Yes      Yes                                 +---------+---------------+---------+-----------+----------+--------------+ SFJ      Full                                                        +---------+---------------+---------+-----------+----------+--------------+  FV Prox  Full                                                        +---------+---------------+---------+-----------+----------+--------------+ FV Mid   Full                                                        +---------+---------------+---------+-----------+----------+--------------+ FV DistalFull                                                        +---------+---------------+---------+-----------+----------+--------------+ PFV      Full                                                        +---------+---------------+---------+-----------+----------+--------------+ POP      Full           Yes      Yes                                 +---------+---------------+---------+-----------+----------+--------------+ PTV      Full                                                         +---------+---------------+---------+-----------+----------+--------------+ PERO     Full                                                        +---------+---------------+---------+-----------+----------+--------------+     Summary: RIGHT: - There is no evidence of deep vein thrombosis in the lower extremity.  - A large, loculated cystic structure is found in the popliteal fossa, measuring 7.1 x 2.5 x 4.5 cm.  LEFT: - There is no evidence of deep vein thrombosis in the lower extremity.  - No cystic structure found in the popliteal fossa.  *See table(s) above for measurements and observations.    Preliminary    DG Chest 2 View  Result Date: 05/31/2022 CLINICAL DATA:  Chest pain and shortness of breath beginning yesterday. EXAM: CHEST - 2 VIEW COMPARISON:  11/04/2015 FINDINGS: The heart size and mediastinal contours are within normal limits. Aortic atherosclerotic calcification incidentally noted. Small pleural effusions are seen, left side greater than right. Diffuse interstitial infiltrates are new and suspicious for interstitial edema. IMPRESSION: New diffuse interstitial infiltrates, suspicious for interstitial edema. Small pleural effusions, left side greater than right. Electronically Signed   By: Marlaine Hind M.D.   On: 05/31/2022 10:10  Scheduled Meds:  amLODipine  5 mg Oral Daily   aspirin EC  81 mg Oral Daily   atorvastatin  10 mg Oral QODAY   carvedilol  3.125 mg Oral BID   cyanocobalamin  5,000 mcg Oral q morning   docusate sodium  100 mg Oral BID   enoxaparin (LOVENOX) injection  40 mg Subcutaneous Q24H   furosemide  20 mg Intravenous BID   losartan  100 mg Oral Daily   polyethylene glycol  17 g Oral QODAY   potassium chloride  20 mEq Oral BID   spironolactone  25 mg Oral Daily   Continuous Infusions:   LOS: 0 days    Time spent: 35 minutes.     Elmarie Shiley, MD [facility]   If 7PM-7AM, please contact  night-coverage [email or web address]  06/01/2022, 7:26 AM

## 2022-06-01 NOTE — Progress Notes (Signed)
Patient refuses CPAP for tonight.  

## 2022-06-01 NOTE — Progress Notes (Signed)
Echocardiogram 2D Echocardiogram has been performed.  Jose Ware 06/01/2022, 10:47 AM

## 2022-06-02 DIAGNOSIS — I5043 Acute on chronic combined systolic (congestive) and diastolic (congestive) heart failure: Secondary | ICD-10-CM | POA: Diagnosis not present

## 2022-06-02 LAB — BASIC METABOLIC PANEL
Anion gap: 12 (ref 5–15)
BUN: 31 mg/dL — ABNORMAL HIGH (ref 8–23)
CO2: 25 mmol/L (ref 22–32)
Calcium: 8.2 mg/dL — ABNORMAL LOW (ref 8.9–10.3)
Chloride: 103 mmol/L (ref 98–111)
Creatinine, Ser: 1.06 mg/dL (ref 0.61–1.24)
GFR, Estimated: >60 mL/min (ref >60)
Glucose, Bld: 96 mg/dL (ref 70–99)
Potassium: 3.3 mmol/L — ABNORMAL LOW (ref 3.5–5.1)
Sodium: 140 mmol/L (ref 135–145)

## 2022-06-02 LAB — CBC
HCT: 36.3 % — ABNORMAL LOW (ref 39.0–52.0)
Hemoglobin: 11.9 g/dL — ABNORMAL LOW (ref 13.0–17.0)
MCH: 32.2 pg (ref 26.0–34.0)
MCHC: 32.8 g/dL (ref 30.0–36.0)
MCV: 98.4 fL (ref 80.0–100.0)
Platelets: 177 10*3/uL (ref 150–400)
RBC: 3.69 MIL/uL — ABNORMAL LOW (ref 4.22–5.81)
RDW: 14.8 % (ref 11.5–15.5)
WBC: 8.4 10*3/uL (ref 4.0–10.5)
nRBC: 0 % (ref 0.0–0.2)

## 2022-06-02 MED ORDER — POTASSIUM CHLORIDE CRYS ER 20 MEQ PO TBCR
40.0000 meq | EXTENDED_RELEASE_TABLET | Freq: Once | ORAL | Status: AC
  Administered 2022-06-02: 40 meq via ORAL
  Filled 2022-06-02: qty 2

## 2022-06-02 MED ORDER — POTASSIUM CHLORIDE CRYS ER 20 MEQ PO TBCR: 40.0000 meq | EXTENDED_RELEASE_TABLET | Freq: Once | ORAL | Status: DC

## 2022-06-02 NOTE — Plan of Care (Signed)
  Problem: Education: Goal: Ability to demonstrate management of disease process will improve Outcome: Progressing Goal: Ability to verbalize understanding of medication therapies will improve Outcome: Progressing   

## 2022-06-02 NOTE — Progress Notes (Signed)
Pt tolerated hospital-provided cpap machine for less than five minutes.  Pt stated he felt that he was suffocating.  This writer made adjustments to cpap pressure, but pt still wanted mask taken off.  Cpap mask removed per pt request and 2l nasal cannula placed back on patient.  RN aware.  Pt stated he may ask his wife to bring in his cpap machine from home.

## 2022-06-02 NOTE — Progress Notes (Signed)
PROGRESS NOTE    Jose Ware  [id number] DOB: 12/21/1934 DOA: 05/31/2022 PCP: [name], MD   Brief Narrative: 88 year old with past medical history significant for colon adenocarcinoma, CAD, history of STEMI, status post stent placement, ischemic cardiomyopathy, chronic combined systolic and diastolic heart failure, CVA, diverticulosis/diverticulitis, DJD, nephrolithiasis, hypertension, non-Hodgkin lymphoma, peripheral neuropathy, history of pneumonia, psoriasis, sleep apnea on CPAP history of CVA who presents to the ED with progressive worsening shortness of breath, associated with chest pressure, orthopnea, lower extremity edema for the last few days prior to admission.  He report diet indiscretion.  He presented with white blood cell 7.1, BMP 1116, chest x-ray diffuse interstitial infiltrates suspicious for edema, small pleural effusion left greater than right.   Patient admitted for heart failure exacerbation.   Assessment & Plan:   Principal Problem:   Acute on chronic combined systolic and diastolic congestive heart failure (HCC) Active Problems:   Hypokalemia   Coronary artery disease involving native coronary artery of native heart without angina pectoris   Essential hypertension   OSA on CPAP   Hypomagnesemia   Dyslipidemia   Bigeminy   1-Acute on Chronic Combined Systolic and Diastolic Heart Failure Exacerbation: -Patient presented with shortness of breath, elevation BNP 1007, chest x-ray with diffuse interstitial edema.  Lower extremity edema. -Report diet indiscretion -Continue with IV Lasix -Wean off of oxygen as tolerated -Strict I's and O's, daily weight --3.5 L -Weight:201---202--191 -Continue with spironolactone. Hold ACE due to mildly increase cr.  Continue to monitor creatinine ECHO: EF 40--45 %, No wall motion abnormalities. Mild to moderate aortic valve stenosis.  Weight decreasing, renal function stable. Plan to continue with IV  lasix.  Continue with diuresis.   Hypokalemia: replete orally while on lasix.   Hypomagnesemia:Replaced.   Bigeminy: Replete electrolytes.  On BB  CAD,; Continue with carvedilol, Lipitor, as needed nitroglycerin.  Hypertension: Continue with carvedilol, Lasix, Norvasc.  OSA on CPAP  Dyslipidemia: Continue Lipitor.  Hyperbilirubinemia: Setting of heart failure exacerbation  Doppler; preliminary report.  Loculated cystic structure is found in the popliteal fossa,  measuring 7.1 x 2.5 x 4.5 cm.  He will need follow up with orthopedic.   Estimated body mass index is 25.22 kg/m as calculated from the following:   Height as of this encounter: 6\' 1"  (1.854 m).   Weight as of this encounter: 86.7 kg.   DVT prophylaxis: Continue with Lovenox Code Status: Full code Family Communication: Care discussed with patient Disposition Plan:  Status is: Observation The patient remains OBS appropriate and will d/c before 2 midnights.    Consultants:  None  Procedures:  Echo  Antimicrobials:    Subjective: He had SOB today, when he lying down flat.  He has less fluids LE>   Objective: Vitals:   06/02/22 0500 06/02/22 0549 06/02/22 1143 06/02/22 1318  BP:  (!) 152/70 (!) 152/76 (!) 153/68  Pulse:  65 (!) 54 67  Resp:  18  16  Temp:  97.9 F (36.6 C)  97.7 F (36.5 C)  TempSrc:  Oral  Oral  SpO2:  98%  98%  Weight: 86.7 kg     Height:        Intake/Output Summary (Last 24 hours) at 06/02/2022 1548 Last data filed at 06/02/2022 1315 Gross per 24 hour  Intake 600 ml  Output 1625 ml  Net -1025 ml    Filed Weights   05/31/22 1346 06/01/22 0500 06/02/22 0500  Weight: 91.6 kg 91.7 kg 86.7 kg  Examination:  General exam: NAD Respiratory system: BL crackles.  Cardiovascular system:  S1, S 2 RRR, Positive JVD Gastrointestinal system: BS present, soft, nt Central nervous system: Alert Extremities: trace edema   Data Reviewed: I have personally reviewed  following labs and imaging studies  CBC: Recent Labs  Lab 05/31/22 0950 06/01/22 0045 06/02/22 0357  WBC 7.1 7.7 8.4  NEUTROABS 5.5  --   --   HGB 12.0* 12.0* 11.9*  HCT 36.9* 36.7* 36.3*  MCV 99.2 98.9 98.4  PLT 206 193 [id number]    Basic Metabolic Panel: Recent Labs  Lab 05/31/22 0950 06/01/22 0045 06/02/22 0357  NA 138 139 140  K 3.4* 3.0* 3.3*  CL 106 105 103  CO2 22 24 25   GLUCOSE 96 81 96  BUN 31* 32* 31*  CREATININE 1.09 1.25* 1.06  CALCIUM 8.5* 8.3* 8.2*  MG 1.5* 2.1  --   PHOS 4.0  --   --     GFR: Estimated Creatinine Clearance: 56.5 mL/min (by C-G formula based on SCr of 1.06 mg/dL). Liver Function Tests: Recent Labs  Lab 05/31/22 0950 06/01/22 0045  AST 13* 12*  ALT 15 14  ALKPHOS 68 59  BILITOT 1.8* 2.1*  PROT 6.4* 6.2*  ALBUMIN 3.6 3.3*    No results for input(s): "LIPASE", "AMYLASE" in the last 168 hours. No results for input(s): "AMMONIA" in the last 168 hours. Coagulation Profile: No results for input(s): "INR", "PROTIME" in the last 168 hours. Cardiac Enzymes: No results for input(s): "CKTOTAL", "CKMB", "CKMBINDEX", "TROPONINI" in the last 168 hours. BNP (last 3 results) No results for input(s): "PROBNP" in the last 8760 hours. HbA1C: No results for input(s): "HGBA1C" in the last 72 hours. CBG: No results for input(s): "GLUCAP" in the last 168 hours. Lipid Profile: No results for input(s): "CHOL", "HDL", "LDLCALC", "TRIG", "CHOLHDL", "LDLDIRECT" in the last 72 hours. Thyroid Function Tests: No results for input(s): "TSH", "T4TOTAL", "FREET4", "T3FREE", "THYROIDAB" in the last 72 hours. Anemia Panel: No results for input(s): "VITAMINB12", "FOLATE", "FERRITIN", "TIBC", "IRON", "RETICCTPCT" in the last 72 hours. Sepsis Labs: No results for input(s): "PROCALCITON", "LATICACIDVEN" in the last 168 hours.  No results found for this or any previous visit (from the past 240 hour(s)).       Radiology Studies: ECHOCARDIOGRAM  COMPLETE  Result Date: 06/01/2022    ECHOCARDIOGRAM REPORT   Patient Name:   AZIR BOLERJACK [facility] Date of Exam: 06/01/2022 Medical Rec #:  [id number]          Height:       73.0 in Accession #:    [id number]         Weight:       202.2 lb Date of Birth:  1935/01/25          BSA:          2.161 m Patient Age:    36 years           BP:           158/52 mmHg Patient Gender: M                  HR:           64 bpm. Exam Location:  Inpatient Procedure: 2D Echo, Cardiac Doppler and Color Doppler Indications:    [id number] Acute systolic (congestive) heart failure  History:        Patient has prior history of Echocardiogram examinations. CAD  and Previous Myocardial Infarction, Stroke; Risk                 Factors:Hypertension.  Sonographer:    Phineas Douglas Referring Phys: [id number] [place]  1. Inferior distal septal and apical hypokinesis . Left ventricular ejection fraction, by estimation, is 40 to 45%. The left ventricle has mildly decreased function. The left ventricle has no regional wall motion abnormalities. The left ventricular internal cavity size was mildly dilated. There is mild left ventricular hypertrophy. Left ventricular diastolic parameters are indeterminate.  2. Right ventricular systolic function is normal. The right ventricular size is normal. There is mildly elevated pulmonary artery systolic pressure.  3. Left atrial size was moderately dilated.  4. The mitral valve is abnormal. Mild mitral valve regurgitation. No evidence of mitral stenosis.  5. The aortic valve is tricuspid. There is moderate calcification of the aortic valve. There is moderate thickening of the aortic valve. Aortic valve regurgitation is not visualized. Mild to moderate aortic valve stenosis.  6. The inferior vena cava is dilated in size with >50% respiratory variability, suggesting right atrial pressure of 8 mmHg. FINDINGS  Left Ventricle: Inferior distal septal and apical hypokinesis. Left  ventricular ejection fraction, by estimation, is 40 to 45%. The left ventricle has mildly decreased function. The left ventricle has no regional wall motion abnormalities. The left ventricular internal cavity size was mildly dilated. There is mild left ventricular hypertrophy. Left ventricular diastolic parameters are indeterminate. Right Ventricle: The right ventricular size is normal. No increase in right ventricular wall thickness. Right ventricular systolic function is normal. There is mildly elevated pulmonary artery systolic pressure. The tricuspid regurgitant velocity is 2.70  m/s, and with an assumed right atrial pressure of 8 mmHg, the estimated right ventricular systolic pressure is 0000000 mmHg. Left Atrium: Left atrial size was moderately dilated. Right Atrium: Right atrial size was normal in size. Pericardium: There is no evidence of pericardial effusion. Mitral Valve: The mitral valve is abnormal. There is mild thickening of the mitral valve leaflet(s). There is mild calcification of the mitral valve leaflet(s). Mild mitral annular calcification. Mild mitral valve regurgitation. No evidence of mitral valve stenosis. Tricuspid Valve: The tricuspid valve is normal in structure. Tricuspid valve regurgitation is trivial. No evidence of tricuspid stenosis. Aortic Valve: The aortic valve is tricuspid. There is moderate calcification of the aortic valve. There is moderate thickening of the aortic valve. Aortic valve regurgitation is not visualized. Mild to moderate aortic stenosis is present. Aortic valve mean gradient measures 9.0 mmHg. Aortic valve peak gradient measures 19.8 mmHg. Aortic valve area, by VTI measures 1.41 cm. Pulmonic Valve: The pulmonic valve was normal in structure. Pulmonic valve regurgitation is not visualized. No evidence of pulmonic stenosis. Aorta: The aortic root is normal in size and structure. Venous: The inferior vena cava is dilated in size with greater than 50% respiratory  variability, suggesting right atrial pressure of 8 mmHg. IAS/Shunts: No atrial level shunt detected by color flow Doppler.  LEFT VENTRICLE PLAX 2D LVIDd:         5.60 cm      Diastology LVIDs:         4.60 cm      LV e' medial:    4.38 cm/s LV PW:         0.90 cm      LV E/e' medial:  22.5 LV IVS:        1.20 cm      LV e' lateral:  3.41 cm/s LVOT diam:     2.20 cm      LV E/e' lateral: 28.9 LV SV:         78 LV SV Index:   36 LVOT Area:     3.80 cm  LV Volumes (MOD) LV vol d, MOD A2C: 221.0 ml LV vol d, MOD A4C: 228.0 ml LV vol s, MOD A2C: 120.0 ml LV vol s, MOD A4C: 111.0 ml LV SV MOD A2C:     101.0 ml LV SV MOD A4C:     228.0 ml LV SV MOD BP:      111.1 ml RIGHT VENTRICLE             IVC RV Basal diam:  4.50 cm     IVC diam: 2.70 cm RV S prime:     18.10 cm/s TAPSE (M-mode): 2.6 cm LEFT ATRIUM              Index        RIGHT ATRIUM           Index LA diam:        4.40 cm  2.04 cm/m   RA Area:     20.40 cm LA Vol (A2C):   106.0 ml 49.04 ml/m  RA Volume:   55.20 ml  25.54 ml/m LA Vol (A4C):   97.5 ml  45.11 ml/m LA Biplane Vol: 106.0 ml 49.04 ml/m  AORTIC VALVE AV Area (Vmax):    1.27 cm AV Area (Vmean):   1.40 cm AV Area (VTI):     1.41 cm AV Vmax:           222.25 cm/s AV Vmean:          135.000 cm/s AV VTI:            0.554 m AV Peak Grad:      19.8 mmHg AV Mean Grad:      9.0 mmHg LVOT Vmax:         74.20 cm/s LVOT Vmean:        49.800 cm/s LVOT VTI:          0.206 m LVOT/AV VTI ratio: 0.37  AORTA Ao Root diam: 3.50 cm Ao Asc diam:  3.70 cm MITRAL VALVE                TRICUSPID VALVE MV Area (PHT): 2.62 cm     TR Peak grad:   29.2 mmHg MV Decel Time: 290 msec     TR Vmax:        270.00 cm/s MV E velocity: 98.50 cm/s MV A velocity: 120.00 cm/s  SHUNTS MV E/A ratio:  0.82         Systemic VTI:  0.21 m                             Systemic Diam: 2.20 cm Jenkins Rouge MD Electronically signed by Jenkins Rouge MD Signature Date/Time: 06/01/2022/11:03:17 AM    Final         Scheduled Meds:  amLODipine   5 mg Oral Daily   aspirin EC  81 mg Oral Daily   atorvastatin  10 mg Oral QODAY   carvedilol  3.125 mg Oral BID   cyanocobalamin  5,000 mcg Oral q morning   docusate sodium  100 mg Oral BID   enoxaparin (LOVENOX) injection  40 mg Subcutaneous Q24H   furosemide  20 mg Intravenous BID  polyethylene glycol  17 g Oral QODAY   potassium chloride  20 mEq Oral BID   spironolactone  25 mg Oral Daily   Continuous Infusions:   LOS: 1 day    Time spent: 35 minutes.     Elmarie Shiley, MD [facility]   If 7PM-7AM, please contact night-coverage [email or web address]  06/02/2022, 3:48 PM

## 2022-06-03 DIAGNOSIS — I5043 Acute on chronic combined systolic (congestive) and diastolic (congestive) heart failure: Secondary | ICD-10-CM | POA: Diagnosis not present

## 2022-06-03 LAB — BASIC METABOLIC PANEL
Anion gap: 9 (ref 5–15)
BUN: 33 mg/dL — ABNORMAL HIGH (ref 8–23)
CO2: 28 mmol/L (ref 22–32)
Calcium: 8.5 mg/dL — ABNORMAL LOW (ref 8.9–10.3)
Chloride: 103 mmol/L (ref 98–111)
Creatinine, Ser: 0.97 mg/dL (ref 0.61–1.24)
GFR, Estimated: >60 mL/min (ref >60)
Glucose, Bld: 97 mg/dL (ref 70–99)
Potassium: 3.6 mmol/L (ref 3.5–5.1)
Sodium: 140 mmol/L (ref 135–145)

## 2022-06-03 MED ORDER — FUROSEMIDE 20 MG PO TABS
20.0000 mg | ORAL_TABLET | Freq: Two times a day (BID) | ORAL | Status: DC
  Administered 2022-06-03 – 2022-06-04 (×2): 20 mg via ORAL
  Filled 2022-06-03 (×2): qty 1

## 2022-06-03 NOTE — Evaluation (Signed)
Physical Therapy Evaluation Patient Details Name: JENNY REESER MRN: [id number] DOB: 1935/01/18 Today's Date: 06/03/2022  History of Present Illness  88 year old with past medical history significant for colon adenocarcinoma, CAD, history of STEMI, status post stent placement, ischemic cardiomyopathy, chronic combined systolic and diastolic heart failure, CVA, diverticulosis/diverticulitis, DJD, nephrolithiasis, hypertension, non-Hodgkin lymphoma, peripheral neuropathy, history of pneumonia, psoriasis, sleep apnea on CPAP history of CVA who presents to the ED with progressive worsening shortness of breath, associated with chest pressure, orthopnea, lower extremity edema for the last few days prior to admission.  He reports diet indiscretion. Dx of acute on chronic CHF.  Clinical Impression  Pt ambulated 200' with his walking stick, which he uses at baseline, no loss of balance, SpO2 98% on room air, HR 75 with walking, no dyspnea. He is mobilizing well and is ready to DC home from a PT standpoint. PT signing off.        Recommendations for follow up therapy are one component of a multi-disciplinary discharge planning process, led by the attending physician.  Recommendations may be updated based on patient status, additional functional criteria and insurance authorization.  Follow Up Recommendations       Assistance Recommended at Discharge None  Patient can return home with the following       Equipment Recommendations None recommended by PT  Recommendations for Other Services       Functional Status Assessment Patient has not had a recent decline in their functional status     Precautions / Restrictions Precautions Precautions: Fall Precaution Comments: denies falls in past 6 months Restrictions Weight Bearing Restrictions: No      Mobility  Bed Mobility Overal bed mobility: Modified Independent             General bed mobility comments: HOB up, used rail     Transfers Overall transfer level: Independent Equipment used: None                    Ambulation/Gait Ambulation/Gait assistance: Modified independent (Device/Increase time) Gait Distance (Feet): 200 Feet Assistive device: Straight cane Gait Pattern/deviations: Decreased stride length, Step-through pattern Gait velocity: decr     General Gait Details: SpO2 98% on room air, HR 75, no loss of balance  Stairs            Wheelchair Mobility    Modified Rankin (Stroke Patients Only)       Balance Overall balance assessment: Modified Independent                                           Pertinent Vitals/Pain Pain Assessment Pain Assessment: No/denies pain    Home Living Family/patient expects to be discharged to:: Private residence Living Arrangements: Spouse/significant other Available Help at Discharge: Family;Available 24 hours/day Type of Home: House Home Access: Stairs to enter Entrance Stairs-Rails: None Entrance Stairs-Number of Steps: 1   Home Layout: One level Home Equipment: Conservation officer, nature (2 wheels);Cane - single point Additional Comments: uses a "walking stick"    Prior Function Prior Level of Function : Independent/Modified Independent             Mobility Comments: walks with a walking stick, denies falls in past 6 months ADLs Comments: independent     Hand Dominance        Extremity/Trunk Assessment   Upper Extremity Assessment Upper Extremity Assessment: Overall WFL for  tasks assessed    Lower Extremity Assessment Lower Extremity Assessment: Overall WFL for tasks assessed    Cervical / Trunk Assessment Cervical / Trunk Assessment: Normal  Communication   Communication: No difficulties  Cognition Arousal/Alertness: Awake/alert Behavior During Therapy: WFL for tasks assessed/performed Overall Cognitive Status: Within Functional Limits for tasks assessed                                           General Comments      Exercises     Assessment/Plan    PT Assessment Patient does not need any further PT services  PT Problem List         PT Treatment Interventions      PT Goals (Current goals can be found in the Care Plan section)  Acute Rehab PT Goals PT Goal Formulation: All assessment and education complete, DC therapy    Frequency       Co-evaluation               AM-PAC PT "6 Clicks" Mobility  Outcome Measure Help needed turning from your back to your side while in a flat bed without using bedrails?: None Help needed moving from lying on your back to sitting on the side of a flat bed without using bedrails?: None Help needed moving to and from a bed to a chair (including a wheelchair)?: None Help needed standing up from a chair using your arms (e.g., wheelchair or bedside chair)?: None Help needed to walk in hospital room?: None Help needed climbing 3-5 steps with a railing? : None 6 Click Score: 24    End of Session Equipment Utilized During Treatment: Gait belt Activity Tolerance: Patient tolerated treatment well Patient left: in chair;with call bell/phone within reach Nurse Communication: Mobility status      Time: 1131-1148 PT Time Calculation (min) (ACUTE ONLY): 17 min   Charges:   PT Evaluation $PT Eval Moderate Complexity: 1 Mod         Philomena Doheny PT 06/03/2022  Acute Rehabilitation Services  Office [phone]

## 2022-06-03 NOTE — Plan of Care (Signed)
  Problem: Education: Goal: Knowledge of General Education information will improve Description Including pain rating scale, medication(s)/side effects and non-pharmacologic comfort measures Outcome: Progressing   Problem: Health Behavior/Discharge Planning: Goal: Ability to manage health-related needs will improve Outcome: Progressing   

## 2022-06-03 NOTE — Progress Notes (Signed)
PROGRESS NOTE    Jose Ware  [id number] DOB: 02/17/1935 DOA: 05/31/2022 PCP: [name], MD   Brief Narrative: 88 year old with past medical history significant for colon adenocarcinoma, CAD, history of STEMI, status post stent placement, ischemic cardiomyopathy, chronic combined systolic and diastolic heart failure, CVA, diverticulosis/diverticulitis, DJD, nephrolithiasis, hypertension, non-Hodgkin lymphoma, peripheral neuropathy, history of pneumonia, psoriasis, sleep apnea on CPAP history of CVA who presents to the ED with progressive worsening shortness of breath, associated with chest pressure, orthopnea, lower extremity edema for the last few days prior to admission.  He report diet indiscretion.  He presented with white blood cell 7.1, BMP 1116, chest x-ray diffuse interstitial infiltrates suspicious for edema, small pleural effusion left greater than right.   Patient admitted for heart failure exacerbation. Treated with IV lasix 20 mg IV BID> he has been transition to oral lasix 3/26. If stable tomorrow plan for discharge home.    Assessment & Plan:   Principal Problem:   Acute on chronic combined systolic and diastolic congestive heart failure (HCC) Active Problems:   Hypokalemia   Coronary artery disease involving native coronary artery of native heart without angina pectoris   Essential hypertension   OSA on CPAP   Hypomagnesemia   Dyslipidemia   Bigeminy   1-Acute on Chronic Combined Systolic and Diastolic Heart Failure Exacerbation: -Patient presented with shortness of breath, elevation BNP 1007, chest x-ray with diffuse interstitial edema.  Lower extremity edema. -Report diet indiscretion -Treated with IV Lasix 20 mg IV BID -Wean off of oxygen as tolerated --4.5 L  -Weight:201---202--191---186 -Continue with spironolactone. -Hold ACE due to mildly increase cr.  -ECHO: EF 40--45 %, No wall motion abnormalities. Mild to moderate aortic valve  stenosis.  -Carvedilol held yesterday morning and today morning due to bradycardia.  -appears euvolemic. Transition to oral lasix 20 mg BID.  Is stable tomorrow plan for discharge home.  He will need follow up with cardiology   Hypokalemia: replete orally while on lasix.   Hypomagnesemia:Replaced.   Bigeminy: Replete electrolytes.  On BB  CAD,; Continue with carvedilol, Lipitor, as needed nitroglycerin.  Hypertension: Continue with carvedilol, Lasix, Norvasc.  OSA on CPAP  Dyslipidemia: Continue Lipitor.  Hyperbilirubinemia: Setting of heart failure exacerbation  Doppler; preliminary report.  Loculated cystic structure is found in the popliteal fossa,  measuring 7.1 x 2.5 x 4.5 cm.  He will need follow up with orthopedic.   Estimated body mass index is 24.55 kg/m as calculated from the following:   Height as of this encounter: 6\' 1"  (1.854 m).   Weight as of this encounter: 84.4 kg.   DVT prophylaxis: Continue with Lovenox Code Status: Full code Family Communication: Care discussed with patient Disposition Plan:  Status is: Observation The patient remains OBS appropriate and will d/c before 2 midnights.    Consultants:  None  Procedures:  Echo  Antimicrobials:    Subjective: He is breathing better, at baseline.  No significant LE edema.   Objective: Vitals:   06/03/22 0500 06/03/22 0517 06/03/22 0937 06/03/22 1249  BP:  (!) 157/63 (!) 143/60 (!) 144/68  Pulse:  61 (!) 54 (!) 53  Resp:  17  20  Temp:  (!) 97.5 F (36.4 C)  98 F (36.7 C)  TempSrc:  Oral    SpO2:  95%  98%  Weight: 84.4 kg     Height:        Intake/Output Summary (Last 24 hours) at 06/03/2022 1411 Last data filed at 06/03/2022 1236  Gross per 24 hour  Intake 480 ml  Output 1615 ml  Net -1135 ml    Filed Weights   06/01/22 0500 06/02/22 0500 06/03/22 0500  Weight: 91.7 kg 86.7 kg 84.4 kg    Examination:  General exam: NAD Respiratory system: CTA Cardiovascular system:  S 1, S 2 RRR no JVD Gastrointestinal system: BS present, soft, nt Central nervous system: Alert, follows command Extremities:No edema    Data Reviewed: I have personally reviewed following labs and imaging studies  CBC: Recent Labs  Lab 05/31/22 0950 06/01/22 0045 06/02/22 0357  WBC 7.1 7.7 8.4  NEUTROABS 5.5  --   --   HGB 12.0* 12.0* 11.9*  HCT 36.9* 36.7* 36.3*  MCV 99.2 98.9 98.4  PLT 206 193 [id number]    Basic Metabolic Panel: Recent Labs  Lab 05/31/22 0950 06/01/22 0045 06/02/22 0357 06/03/22 1111  NA 138 139 140 140  K 3.4* 3.0* 3.3* 3.6  CL 106 105 103 103  CO2 22 24 25 28   GLUCOSE 96 81 96 97  BUN 31* 32* 31* 33*  CREATININE 1.09 1.25* 1.06 0.97  CALCIUM 8.5* 8.3* 8.2* 8.5*  MG 1.5* 2.1  --   --   PHOS 4.0  --   --   --     GFR: Estimated Creatinine Clearance: 61.8 mL/min (by C-G formula based on SCr of 0.97 mg/dL). Liver Function Tests: Recent Labs  Lab 05/31/22 0950 06/01/22 0045  AST 13* 12*  ALT 15 14  ALKPHOS 68 59  BILITOT 1.8* 2.1*  PROT 6.4* 6.2*  ALBUMIN 3.6 3.3*    No results for input(s): "LIPASE", "AMYLASE" in the last 168 hours. No results for input(s): "AMMONIA" in the last 168 hours. Coagulation Profile: No results for input(s): "INR", "PROTIME" in the last 168 hours. Cardiac Enzymes: No results for input(s): "CKTOTAL", "CKMB", "CKMBINDEX", "TROPONINI" in the last 168 hours. BNP (last 3 results) No results for input(s): "PROBNP" in the last 8760 hours. HbA1C: No results for input(s): "HGBA1C" in the last 72 hours. CBG: No results for input(s): "GLUCAP" in the last 168 hours. Lipid Profile: No results for input(s): "CHOL", "HDL", "LDLCALC", "TRIG", "CHOLHDL", "LDLDIRECT" in the last 72 hours. Thyroid Function Tests: No results for input(s): "TSH", "T4TOTAL", "FREET4", "T3FREE", "THYROIDAB" in the last 72 hours. Anemia Panel: No results for input(s): "VITAMINB12", "FOLATE", "FERRITIN", "TIBC", "IRON", "RETICCTPCT" in the last 72  hours. Sepsis Labs: No results for input(s): "PROCALCITON", "LATICACIDVEN" in the last 168 hours.  No results found for this or any previous visit (from the past 240 hour(s)).       Radiology Studies: No results found.      Scheduled Meds:  amLODipine  5 mg Oral Daily   aspirin EC  81 mg Oral Daily   atorvastatin  10 mg Oral QODAY   carvedilol  3.125 mg Oral BID   cyanocobalamin  5,000 mcg Oral q morning   docusate sodium  100 mg Oral BID   enoxaparin (LOVENOX) injection  40 mg Subcutaneous Q24H   furosemide  20 mg Intravenous BID   polyethylene glycol  17 g Oral QODAY   potassium chloride  20 mEq Oral BID   spironolactone  25 mg Oral Daily   Continuous Infusions:   LOS: 2 days    Time spent: 35 minutes.     Elmarie Shiley, MD [facility]   If 7PM-7AM, please contact night-coverage [email or web address]  06/03/2022, 2:11 PM

## 2022-06-03 NOTE — Progress Notes (Signed)
Pt refused cpap tonight.  Pt stated he is going to wait and wear his cpap when he gets back home.  Pt was advised that RT is available all night should he change his mind.  RN aware.

## 2022-06-03 NOTE — Progress Notes (Signed)
OT Cancellation Note  Patient Details Name: JAXSON WEISMAN MRN: [id number] DOB: [birth year]   Cancelled Treatment:    Reason Eval/Treat Not Completed: OT screened, no needs identified, will sign off Per PT, patient is independent in room with walking stick. No OT needs at this time.  Rennie Plowman, [place] [facility] Office# [phone]  06/03/2022, 12:03 PM

## 2022-06-04 ENCOUNTER — Other Ambulatory Visit: Payer: Self-pay | Admitting: *Deleted

## 2022-06-04 DIAGNOSIS — I5043 Acute on chronic combined systolic (congestive) and diastolic (congestive) heart failure: Secondary | ICD-10-CM

## 2022-06-04 LAB — BASIC METABOLIC PANEL
Anion gap: 8 (ref 5–15)
BUN: 34 mg/dL — ABNORMAL HIGH (ref 8–23)
CO2: 29 mmol/L (ref 22–32)
Calcium: 8.4 mg/dL — ABNORMAL LOW (ref 8.9–10.3)
Chloride: 103 mmol/L (ref 98–111)
Creatinine, Ser: 1.24 mg/dL (ref 0.61–1.24)
GFR, Estimated: 57 mL/min — ABNORMAL LOW (ref >60)
Glucose, Bld: 86 mg/dL (ref 70–99)
Potassium: 3.7 mmol/L (ref 3.5–5.1)
Sodium: 140 mmol/L (ref 135–145)

## 2022-06-04 MED ORDER — POTASSIUM CHLORIDE CRYS ER 20 MEQ PO TBCR: 20.0000 meq | EXTENDED_RELEASE_TABLET | Freq: Every day | ORAL | 0 refills | Status: DC

## 2022-06-04 MED ORDER — FUROSEMIDE 20 MG PO TABS: 20.0000 mg | ORAL_TABLET | Freq: Two times a day (BID) | ORAL | 0 refills | Status: DC

## 2022-06-04 NOTE — Care Management Important Message (Signed)
Important Message  Patient Details IM Letter given. Name: Jose Ware MRN: [id number] Date of Birth: [birth year]   Medicare Important Message Given:  Yes     Kerin Salen 06/04/2022, 11:42 AM

## 2022-06-04 NOTE — Discharge Summary (Signed)
Physician Discharge Summary  Patient ID: Jose Ware MRN: [id number] DOB/AGE: 10-12-1934 88 y.o.  Admit date: 05/31/2022 Discharge date: 06/04/2022  Admission Diagnoses:  Discharge Diagnoses:  Principal Problem:   Acute on chronic combined systolic and diastolic congestive heart failure (HCC) Active Problems:   Essential hypertension   Coronary artery disease involving native coronary artery of native heart without angina pectoris   Dyslipidemia   OSA on CPAP   Hypokalemia   Hypomagnesemia   Bigeminy   Discharged Condition: stable  Hospital Course: Patient is an 88 year old with past medical history significant for colon adenocarcinoma, CAD, history of STEMI, status post stent placement, ischemic cardiomyopathy, chronic combined systolic and diastolic heart failure, CVA, diverticulosis/diverticulitis, DJD, nephrolithiasis, hypertension, non-Hodgkin lymphoma, peripheral neuropathy, history of pneumonia, psoriasis, sleep apnea on CPAP history of CVA who presents to the ED with progressive worsening shortness of breath, associated with chest pressure, orthopnea, lower extremity edema for the last few days prior to admission.  Patient reported dietary indiscretion prior to admission.  On presentation, BNP was 1116 and chest x-ray revealed diffuse interstitial infiltrates suspicious for edema, small pleural effusion left greater than right.  Patient was admitted and managed for heart failure exacerbation.  Patient was treated with IV diuretics.  Diuretics were transitioned to oral.  Patient continued to improve.  Patient will be discharged back home today to the care of the primary care provider.   1-Acute on Chronic Combined Systolic and Diastolic Heart Failure Exacerbation: -Patient presented with shortness of breath, elevated BNP 1007, chest x-ray with diffuse interstitial edema.  Lower extremity edema. -Reported diet indiscretion -Treated with IV Lasix 20 mg IV BID -Wean off of  oxygen as tolerated --4.5 L  -Weight:201---202--191---186 -Continue with spironolactone. -Held ACE due to mildly increase cr.  -ECHO: EF 40--45 %, No wall motion abnormalities. Mild to moderate aortic valve stenosis.  -Carvedilol held yesterday morning and today morning due to bradycardia.  -appears euvolemic. Transition to oral lasix 20 mg BID.  Is stable tomorrow plan for discharge home.  He will need follow up with cardiology    Hypokalemia: replete orally while on lasix.    Hypomagnesemia:Replaced.    Bigeminy: Replete electrolytes.  On BB   CAD,; Continue with carvedilol, Lipitor, as needed nitroglycerin.   Hypertension: Continue with carvedilol, Lasix, Norvasc.   OSA on CPAP   Dyslipidemia: Continue Lipitor.   Hyperbilirubinemia: Setting of heart failure exacerbation   Doppler; preliminary report.  Loculated cystic structure is found in the popliteal fossa,  measuring 7.1 x 2.5 x 4.5 cm.  He will need follow up with orthopedic.    Estimated body mass index is 24.55 kg/m as calculated from the following:   Height as of this encounter: 6\' 1"  (1.854 m).   Weight as of this encounter: 84.4 kg.      Consults: None  Significant Diagnostic Studies:  Echo: 1. Inferior distal septal and apical hypokinesis . Left ventricular  ejection fraction, by estimation, is 40 to 45%. The left ventricle has  mildly decreased function. The left ventricle has no regional wall motion  abnormalities. The left ventricular  internal cavity size was mildly dilated. There is mild left ventricular  hypertrophy. Left ventricular diastolic parameters are indeterminate.   2. Right ventricular systolic function is normal. The right ventricular  size is normal. There is mildly elevated pulmonary artery systolic  pressure.   3. Left atrial size was moderately dilated.   4. The mitral valve is abnormal. Mild mitral valve regurgitation. No  evidence of mitral stenosis.   5. The aortic valve is  tricuspid. There is moderate calcification of the  aortic valve. There is moderate thickening of the aortic valve. Aortic  valve regurgitation is not visualized. Mild to moderate aortic valve  stenosis.   6. The inferior vena cava is dilated in size with >50% respiratory  variability, suggesting right atrial pressure of 8 mmHg.    CXR: New diffuse interstitial infiltrates, suspicious for interstitial edema.   Small pleural effusions, left side greater than right.   Discharge Exam: Blood pressure 129/87, pulse (!) 59, temperature 97.8 F (36.6 C), temperature source Oral, resp. rate 18, height 6\' 1"  (1.854 m), weight 84.1 kg, SpO2 94 %.   Disposition: Discharge disposition: 01-Home or Self Care       Discharge Instructions     Diet - low sodium heart healthy   Complete by: As directed    Increase activity slowly   Complete by: As directed       Allergies as of 06/04/2022       Reactions   Bee Venom Swelling   Facial swelling   Betadine [povidone Iodine] Other (See Comments)   blistering   Doxycycline    Possible drug rash- back of right lower leg and onto left lower leg as well        Medication List     STOP taking these medications    dicyclomine 20 MG tablet Commonly known as: BENTYL   losartan 100 MG tablet Commonly known as: COZAAR   senna-docusate 8.6-50 MG tablet Commonly known as: Senokot-S       TAKE these medications    acetaminophen 650 MG CR tablet Commonly known as: TYLENOL Take 650 mg by mouth every 4 (four) hours as needed for pain.   amLODipine 5 MG tablet Commonly known as: NORVASC Take 1 tablet by mouth once daily   aspirin EC 81 MG tablet Take 1 tablet (81 mg total) by mouth daily.   atorvastatin 10 MG tablet Commonly known as: LIPITOR Take 1 tablet (10 mg total) by mouth every other day.   carvedilol 3.125 MG tablet Commonly known as: COREG Take 1 tablet by mouth twice daily   docusate sodium 100 MG  capsule Commonly known as: COLACE Take 100 mg by mouth 2 (two) times daily.   furosemide 20 MG tablet Commonly known as: LASIX Take 1 tablet (20 mg total) by mouth 2 (two) times daily.   naphazoline-pheniramine 0.025-0.3 % ophthalmic solution Commonly known as: NAPHCON-A Place 2 drops into both eyes 4 (four) times daily as needed for eye irritation or allergies.   nitroGLYCERIN 0.4 MG SL tablet Commonly known as: NITROSTAT Place 1 tablet (0.4 mg total) under the tongue every 5 (five) minutes x 3 doses as needed for chest pain.   OCUVITE ADULT 50+ PO Take 1 tablet by mouth daily.   phenol 1.4 % Liqd Commonly known as: CHLORASEPTIC Use as directed 1 spray in the mouth or throat as needed for throat irritation / pain.   polyethylene glycol 17 g packet Commonly known as: MIRALAX / GLYCOLAX Take 17 g by mouth daily. What changed: when to take this   potassium chloride SA 20 MEQ tablet Commonly known as: KLOR-CON M Take 1 tablet (20 mEq total) by mouth daily for 14 days.   PreviDent 5000 Dry Mouth 1.1 % Gel dental gel Generic drug: sodium fluoride Place 1 Application onto teeth at bedtime.   spironolactone 25 MG tablet Commonly known as:  ALDACTONE Take 25 mg by mouth daily. Per cardiology   Vitamin B-12 5000 MCG Tbdp Take 5,000 mcg by mouth every morning.        Follow-up Information     [facility]. Go in 19 day(s).   Specialty: Cardiology Why: Hospital follow up 06/23/2022 @ 11 am PLEASE bring a current medication list to appointment FREE valet parking,Entrance C, off [address] information: [address] [id number] mc [place] [address] [phone]                Signed: Barnetta Chapel 06/04/2022, 1:13 PM

## 2022-06-04 NOTE — Progress Notes (Signed)
Physical Therapy Treatment Patient Details Name: Jose Ware MRN: [id number] DOB: 22-May-1934 Today's Date: 06/04/2022   History of Present Illness 88 year old with past medical history significant for colon adenocarcinoma, CAD, history of STEMI, status post stent placement, ischemic cardiomyopathy, chronic combined systolic and diastolic heart failure, CVA, diverticulosis/diverticulitis, DJD, nephrolithiasis, hypertension, non-Hodgkin lymphoma, peripheral neuropathy, history of pneumonia, psoriasis, sleep apnea on CPAP history of CVA who presents to the ED with progressive worsening shortness of breath, associated with chest pressure, orthopnea, lower extremity edema for the last few days prior to admission.  He reports diet indiscretion. Dx of acute on chronic CHF.    PT Comments    Received message from [facility] that pt's wife is requesting HHPT for pt. Spoke to pt and spouse today and recommended they request outpatient PT order from primary care Dr as pt would not be considered homebound at his current functional level and therefore wouldn't qualify for HHPT. Reviewed recommendation that pt use RW rather than his walking cane for increased support. Wife is wanting to know if pt can work out at the gym considering his cardiac issues, I stated MD would need to answer this question. Dr. Marthenia Rolling verbally updated about this conservation.      Recommendations for follow up therapy are one component of a multi-disciplinary discharge planning process, led by the attending physician.  Recommendations may be updated based on patient status, additional functional criteria and insurance authorization.  Follow Up Recommendations       Assistance Recommended at Discharge None  Patient can return home with the following     Equipment Recommendations  None recommended by PT    Recommendations for Other Services       Precautions / Restrictions Precautions Precautions: Fall Precaution Comments:  denies falls in past 6 months Restrictions Weight Bearing Restrictions: No     Mobility  Bed Mobility Overal bed mobility: Modified Independent             General bed mobility comments: HOB up, used rail    Transfers Overall transfer level: Modified independent Equipment used: None               General transfer comment: increased time/effort, used bedrail, mildly unsteady initially upon standing    Ambulation/Gait Ambulation/Gait assistance: Modified independent (Device/Increase time) Gait Distance (Feet): 15 Feet Assistive device: Straight cane Gait Pattern/deviations: Decreased stride length, Step-through pattern, Trunk flexed Gait velocity: decr     General Gait Details: no loss of balance, but mildly unsteady, recommended use of RW at home   Stairs             Wheelchair Mobility    Modified Rankin (Stroke Patients Only)       Balance Overall balance assessment: Modified Independent                                          Cognition Arousal/Alertness: Awake/alert Behavior During Therapy: WFL for tasks assessed/performed Overall Cognitive Status: Within Functional Limits for tasks assessed                                          Exercises      General Comments        Pertinent Vitals/Pain Pain Assessment Pain Assessment: No/denies pain  Home Living                          Prior Function            PT Goals (current goals can now be found in the care plan section) Acute Rehab PT Goals PT Goal Formulation: All assessment and education complete, DC therapy    Frequency           PT Plan Discharge plan needs to be updated    Co-evaluation              AM-PAC PT "6 Clicks" Mobility   Outcome Measure  Help needed turning from your back to your side while in a flat bed without using bedrails?: None Help needed moving from lying on your back to sitting on the  side of a flat bed without using bedrails?: None Help needed moving to and from a bed to a chair (including a wheelchair)?: None Help needed standing up from a chair using your arms (e.g., wheelchair or bedside chair)?: None Help needed to walk in hospital room?: None Help needed climbing 3-5 steps with a railing? : None 6 Click Score: 24    End of Session   Activity Tolerance: Patient tolerated treatment well Patient left: in chair;with call bell/phone within reach;with family/visitor present Nurse Communication: Mobility status       Time: 1212-1220 PT Time Calculation (min) (ACUTE ONLY): 8 min  Charges:  $Gait Training: 8-22 mins                     Blondell Reveal Kistler PT 06/04/2022  [place]  Office [phone]

## 2022-06-04 NOTE — Plan of Care (Signed)
  Problem: Education: Goal: Knowledge of General Education information will improve Description Including pain rating scale, medication(s)/side effects and non-pharmacologic comfort measures Outcome: Progressing   Problem: Health Behavior/Discharge Planning: Goal: Ability to manage health-related needs will improve Outcome: Progressing   

## 2022-06-04 NOTE — Progress Notes (Signed)
Physical Therapy Update  Received message from [facility] that pt's wife is requesting HHPT for pt. Spoke to pt and spouse today and recommended they request outpatient PT order from primary care Dr as pt would not be considered homebound at his current functional level and therefore wouldn't qualify for HHPT. Reviewed recommendation that pt use RW rather than his walking cane for increased support. Wife is wanting to know if pt can work out at the gym considering his cardiac issues, I stated MD would need to answer this question. Dr. Marthenia Rolling verbally updated about this conservation.   Blondell Reveal Kistler PT 06/04/2022  Acute Rehabilitation Services  Office [phone]

## 2022-06-04 NOTE — TOC Initial Note (Addendum)
Transition of Care [facility]) - Initial/Assessment Note    Patient Details  Name: Jose Ware MRN: [id number] Date of Birth: 1934/08/11  Transition of Care [facility]) CM/SW Contact:    Illene Regulus, LCSW Phone Number: 06/04/2022, 10:12 AM  Clinical Narrative:                 CSW received a message regarding pt changed his mind about [facility] services. CSW spoke with pt and his wife, they stated pt will need additional help in the home. Pt's wife stated she would like [facility] , as she has had the agency in the past. Pt will need HHPT orders , MD made aware. CSW to arrange [facility] services. TOC to follow.    [place]  10:32 Pt will need PT recommendation for Home health services. PT has recommended no PT follow up. MD and RN made aware. TOC to follow.  1:00pm PT is recommending OP PT pt to discuss PCP about referral for this. No Additional TOC needs TOC sign off.  Expected Discharge Plan: [place] Barriers to Discharge: Continued Medical Work up   Patient Goals and CMS Choice Patient states their goals for this hospitalization and ongoing recovery are:: Return home with home health CMS Medicare.gov Compare Post Acute Care list provided to:: Patient Choice offered to / list presented to : Patient      Expected Discharge Plan and Services In-house Referral: Clinical Social Work     Living arrangements for the past 2 months: Single Family Home                                      Prior Living Arrangements/Services Living arrangements for the past 2 months: Single Family Home Lives with:: Self, Spouse Patient language and need for interpreter reviewed:: Yes Do you feel safe going back to the place where you live?: Yes      Need for Family Participation in Patient Care: No (Comment) Care giver support system in place?: No (comment)   Criminal Activity/Legal Involvement Pertinent to Current Situation/Hospitalization: No - Comment as needed  Activities of  Daily Living Home Assistive Devices/Equipment: Built-in shower seat, CPAP, Dentures (specify type), Eyeglasses, Grab bars around toilet, Grab bars in shower, Walker (specify type) ADL Screening (condition at time of admission) Patient's cognitive ability adequate to safely complete daily activities?: Yes Is the patient deaf or have difficulty hearing?: No Does the patient have difficulty seeing, even when wearing glasses/contacts?: No Does the patient have difficulty concentrating, remembering, or making decisions?: No Patient able to express need for assistance with ADLs?: Yes Does the patient have difficulty dressing or bathing?: No Independently performs ADLs?: Yes (appropriate for developmental age) Does the patient have difficulty walking or climbing stairs?: No Weakness of Legs: None Weakness of Arms/Hands: None  Permission Sought/Granted                  Emotional Assessment Appearance:: Appears stated age Attitude/Demeanor/Rapport: Gracious Affect (typically observed): Accepting Orientation: : Oriented to  Time, Oriented to Place, Oriented to Self, Oriented to Situation   Psych Involvement: No (comment)  Admission diagnosis:  Acute on chronic combined systolic and diastolic congestive heart failure [facility]) [I50.43] Patient Active Problem List   Diagnosis Date Noted   Acute on chronic combined systolic and diastolic congestive heart failure ([place]) 05/31/2022   Bigeminy 05/31/2022   Hypokalemia 03/21/2022   Hypomagnesemia 03/21/2022  Follicular lymphoma, unspecified, intra-abdominal lymph nodes ([place]) 04/11/2021   History of small bowel obstruction 11/13/2020   OSA on CPAP 05/08/2020   Pain of back and left lower extremity 06/08/2019   Left knee pain 06/06/2019   Onychomycosis 10/28/2017   Spondylosis 06/25/2017   Post herpetic neuralgia 06/20/2017   Spinal stenosis of lumbar region at multiple levels 04/30/2017   Drug reaction 02/05/2017   Senile purpura ([place])  02/05/2017   Dyslipidemia 11/16/2015   History of ventricular tachycardia 11/08/2015   Coronary artery disease involving native coronary artery of native heart without angina pectoris    Ischemic cardiomyopathy    History of embolic stroke    Post-traumatic osteoarthritis of right wrist 04/25/2015   Hx of adenomatous colonic polyps 01/18/2015   History of primary hyperparathyroidism s/p parathyroidectomy (benign adenoma) 12/28/2013   History of stomach cancer 12/01/2013   Fatigue 12/01/2013   Lower back pain 03/24/2013   Bradycardia 09/23/2012   B12 deficiency 09/17/2010   History of prostate cancer 12/14/2008   PERIPHERAL NEUROPATHY 10/18/2007   PSORIASIS 10/15/2007   Essential hypertension 11/27/2006   DEGENERATIVE JOINT DISEASE 11/27/2006   NEPHROLITHIASIS, HX OF 11/27/2006   PCP:  [name], MD Pharmacy:   [facility], [place] [address] Phone: [phone] Fax: [phone]     Social Determinants of Health (SDOH) Social History: SDOH Screenings   Food Insecurity: No Food Insecurity (05/31/2022)  Housing: Low Risk  (05/31/2022)  Transportation Needs: No Transportation Needs (05/31/2022)  Utilities: Not At Risk (05/31/2022)  Depression (PHQ2-9): Low Risk  (05/20/2022)  Recent Concern: Depression (PHQ2-9) - Medium Risk (04/04/2022)  Financial Resource Strain: Low Risk  (05/20/2022)  Physical Activity: Inactive (05/20/2022)  Social Connections: Socially Integrated (05/20/2022)  Stress: No Stress Concern Present (05/20/2022)  Tobacco Use: Medium Risk (05/31/2022)   SDOH Interventions:     Readmission Risk Interventions    03/25/2022    4:07 PM 03/24/2022    3:44 PM  Readmission Risk Prevention Plan  Transportation Screening Complete Complete  PCP or Specialist Appt within 5-7 Days  Complete  Home Care Screening  Complete  Medication Review (RN CM)  Complete

## 2022-06-04 NOTE — Consult Note (Signed)
   [facility] Inpatient Consult   06/04/2022  Jose Ware Dec 05, 1934 [id number]  Orientation with Natividad Brood, [facility] Liaison for review.   Location: [facility] Liaison screened remotely [name]).   [place] [facility]) [name] Patient: Oncologist)    Primary Care Provider:  [name], MD with [facility].   Patient screened for readmission hospitalization with noted high risk score for unplanned readmission risk. [facility] RN liaison assessed for potential [facility]) Care Management service needs for post hospital transition for care coordination.  Spoke with pt concerning [facility] RN care coordination services as pt receptive to a follow up call.   Plan:  HIPAA verified and [facility] RN will continue to follow ongoing disposition to assess for post hospital community care coordination/management needs.  Referral request for community care coordination: due to high risk score and prevention for readmission to the hospital.   [place] does not replace or interfere with any arrangements made by the Inpatient Transition of Care team.   For questions contact:Azul Coffie Zigmund Daniel, RN, [place] Office Hours M-F 8:00 am to 5 pm [phone] mobile [phone] [Office toll free line][facility] Office Hours are M-F 8:30 - 5 pm 24 hour nurse advise line [phone] Conceirge  [name]

## 2022-06-04 NOTE — Progress Notes (Signed)
Heart Failure Nurse Navigator Progress Note  PCP: [name], MD PCP-Cardiologist: [place] Admission Diagnosis: Acute on chronic combined systolic and diastolic congestive heart failure.  Admitted from: Home   Presentation:   Jose Ware presented with shortness of breath and chest pain x a few days, BLE 3+ up to knees.   Patient with a MI history and 2 stents. BP 165/74, HR ?35.BNP 1,115, CXR with new diffuse interstitial infiltrates sus pious for edema. Patient reported to dietary non compliance with salt.   ECHO/ LVEF: 40-45% HFrEF  Clinical Course:  Past Medical History:  Diagnosis Date   Adenocarcinoma of prostate ([place])    XRT & Radiation seed implantation in 2010   Adenomatous colon polyp    CAD (coronary artery disease)    a. 11/04/15:  Acute inferolateral STEMI: S/p emergent DES of a very large LCx with extensive thrombus. Significant residual disease in the proximal LAD and distal RCA. S/p staged PCI of LAD and RCA 8/29.   Cataract    CVA (cerebral infarction)    a. 123456: embolic CVA after heart cath    CVA (cerebral infarction)    a. 123456: embolic CVA after heart cath    DIVERTICULITIS, HX OF 11/27/2006        DJD (degenerative joint disease)    wrist - R    Hernia    History of blood transfusion 1985   with colon surgery   History of kidney stones    Hypertension    Ischemic cardiomyopathy    a. EF 25-30% by LV gram on cath 11/04/15. Appeared out of proportion to infarct although infarct was large. EF improved by Echo and now 40-45%.   Myocardial infarction ([place]) 2019   NEPHROLITHIASIS, HX OF 11/27/2006         Non Hodgkin's lymphoma ([place]) 11/2020   watching at this point, in abdomen, no meds or chemo yet as of 01-27-2022   Peripheral neuropathy    pt denies   Pneumonia    as a child   Psoriasis    Sleep apnea    wears cpap   Stroke ([place])    no residuals   Tenosynovitis      Social History   Socioeconomic History   Marital status:  Married    Spouse name: Not on file   Number of children: 3   Years of education: Not on file   Highest education level: Not on file  Occupational History   Not on file  Tobacco Use   Smoking status: Former    Packs/day: 2.00    Years: 30.00    Additional pack years: 0.00    Total pack years: 60.00    Types: Cigarettes    Quit date: 03/11/1983    Years since quitting: 39.2   Smokeless tobacco: Never   Tobacco comments:    started smoking in the service; ICU 85' part of his stomach; appendix; coma   Vaping Use   Vaping Use: Never used  Substance and Sexual Activity   Alcohol use: Not Currently    Comment: RARE, maybe one drink a couple times a year   Drug use: Never   Sexual activity: Not Currently  Other Topics Concern   Not on file  Social History Narrative   [employer], [place].Married 53 - Divorced 1996; remarried '03 different wife3 sons - '63, '65, '67Grandchildren -14; 1 great grand daughter; 4 great grands on wife's side, 1 great great granddaughter  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      Hobbies: fishing, busy volunteering   Social Determinants of [place] Strain: [place]  (05/20/2022)   Overall Financial Resource Strain (CARDIA)    Difficulty of Paying Living Expenses: Not hard at all  Food Insecurity: No Food Insecurity (05/31/2022)   Hunger Vital Sign    Worried About Running Out of Food in the Last Year: Never true    [place] in the Last Year: Never true  Transportation Needs: No Transportation Needs (05/31/2022)   PRAPARE - Hydrologist (Medical): No    Lack of Transportation (Non-Medical): No  Physical Activity: Inactive (05/20/2022)   Exercise Vital Sign    Days of Exercise per Week: 0 days    Minutes of Exercise per Session: 0 min  Stress: No Stress Concern Present (05/20/2022)   [place]    Feeling of Stress : Not at all  Social Connections: [place] (05/20/2022)   Social Connection and Isolation Panel [NHANES]    Frequency of Communication with Friends and Family: More than three times a week    Frequency of Social Gatherings with Friends and Family: More than three times a week    Attends Religious Services: More than 4 times per year    Active Member of [employer] or Organizations: Yes    Attends Archivist Meetings: 1 to 4 times per year    Marital Status: Married    High Risk Criteria for Readmission and/or Poor Patient Outcomes: Heart failure hospital admissions (last 6 months): 1  No Show rate: 2%  Difficult social situation: Unknown  Demonstrates medication adherence: No Primary Language: English  Literacy level: Unknown  Barriers of Care:   Diet/ fluid restrictions  Considerations/Referrals:   Referral made to Heart Failure Pharmacist Stewardship: No Referral made to Heart Failure CSW/NCM TOC: No Referral made to Heart & Vascular TOC clinic: Patient at [facility], unable to speak with patient, no answer to phone number on file, HF TOC 06/23/2022 @ 11 am.   Items for Follow-up on DC/TOC: Diet/ fluid compliance, reported to dietary indiscretion.    Earnestine Leys, BSN, Clinical cytogeneticist Only

## 2022-06-04 NOTE — Progress Notes (Signed)
Mobility Specialist - Progress Note   06/04/22 1107  Mobility  Activity Ambulated with assistance in hallway  Level of Assistance Contact guard assist, steadying assist  Assistive Device Cane  Distance Ambulated (ft) 180 ft  Activity Response Tolerated well  Mobility Referral Yes  $Mobility charge 1 Mobility   Pt received in bed and agreeable to mobility. Once standing pt had some LOB that was self corrected. No complaints during session. Pt to bed after session with all needs met.    [facility]

## 2022-06-05 ENCOUNTER — Telehealth: Payer: Self-pay

## 2022-06-05 ENCOUNTER — Ambulatory Visit: Payer: Self-pay

## 2022-06-05 NOTE — Chronic Care Management (AMB) (Signed)
   06/05/2022  Jose Ware 10-Mar-1935 [id number]   Reason for Encounter: Patient is not currently enrolled in the CCM program. CCM status changed to previously enrolled.   Horris Latino RN Care Manager/Chronic Care Management [phone]

## 2022-06-05 NOTE — Transitions of Care (Post Inpatient/ED Visit) (Signed)
   06/05/2022  Name: Jose Ware MRN: [id number] DOB: Jan 28, 1935  Today's TOC FU Call Status: Today's TOC FU Call Status:: Successful TOC FU Call Competed TOC FU Call Complete Date: 06/05/22  Transition Care Management Follow-up Telephone Call Date of Discharge: 06/04/22 Discharge Facility: [facility]) Type of Discharge: Inpatient Admission Primary Inpatient Discharge Diagnosis:: 'acute on chronic CHF" How have you been since you were released from the hospital?: Better (Patient states he rested well last night-has had a good breakfast. Denies any acute issues/concerns at present) Any questions or concerns?: No  Items Reviewed: Did you receive and understand the discharge instructions provided?: No Medications obtained and verified?: Yes (Medications Reviewed) Any new allergies since your discharge?: No Dietary orders reviewed?: Yes Type of Diet Ordered:: low salt/heart healthy Do you have support at home?: Yes People in Home: spouse Name of Support/Comfort Primary Source: [facility] and Equipment/Supplies: [place] Ordered?: NA Any new equipment or medical supplies ordered?: NA  Functional Questionnaire: Do you need assistance with bathing/showering or dressing?: No Do you need assistance with meal preparation?: No Do you need assistance with eating?: No Do you have difficulty maintaining continence: Yes (pt reports wears pad for occassional urinary incontinence) Do you need assistance with getting out of bed/getting out of a chair/moving?: No Do you have difficulty managing or taking your medications?: No  Follow up appointments reviewed: PCP Follow-up appointment confirmed?: [facility] Follow-up appointment confirmed?: Yes Date of Specialist follow-up appointment?: 06/23/22 Follow-Up Specialty Provider:: [facility] Do you need transportation to your follow-up appointment?: No Do you understand care options if  your condition(s) worsen?: Yes-patient verbalized understanding   TOC Interventions Today    Flowsheet Row Most Recent Value  TOC Interventions   TOC Interventions Discussed/Reviewed TOC Interventions Discussed      Interventions Today    Flowsheet Row Most Recent Value  Chronic Disease   Chronic disease during today's visit Congestive Heart Failure (CHF)  General Interventions   General Interventions Discussed/Reviewed General Interventions Discussed, Referral to Nurse  [follow up appt scheduled with assigned RN CM]  Education Interventions   Education Provided Provided Education  [HF mgmt, wgt monitoring-instructed on daily wgt and proper technique-pt states spouse gone to go buy a scale and will begin weighing]  Provided Verbal Education On Nutrition, When to see the doctor, Medication  Nutrition Interventions   Nutrition Discussed/Reviewed Nutrition Discussed, Adding fruits and vegetables, Decreasing salt  Pharmacy Interventions   Pharmacy Dicussed/Reviewed Pharmacy Topics Discussed, Medications and their functions  Safety Interventions   Safety Discussed/Reviewed Safety Discussed        [facility]etty Blend [facility] Health/[facility] Care Management Care Management Community Coordinator Direct Phone: [phone] Toll Free: [phone] Fax: [phone]

## 2022-06-06 ENCOUNTER — Other Ambulatory Visit: Payer: Self-pay | Admitting: Family Medicine

## 2022-06-11 ENCOUNTER — Ambulatory Visit: Payer: Self-pay

## 2022-06-11 NOTE — Patient Outreach (Signed)
  Care Coordination   Initial Visit Note   06/11/2022 Name: Jose Ware MRN: [id number] DOB: 06/22/1934  Jose Ware is a 88 y.o. year old male who sees Yong Channel, Brayton Mars, MD for primary care. I spoke with  Jose Ware by phone today.  What matters to the patients health and wellness today?  Heart failure management    Goals Addressed             This Visit's Progress    Heart Failure Management       Patient Goals/Self Care Activities: -Patient/Caregiver will self-administer medications as prescribed as evidenced by self-report/primary caregiver report  -Patient/Caregiver will attend all scheduled provider appointments as evidenced by clinician review of documented attendance to scheduled appointments and patient/caregiver report -Patient/Caregiver will call provider office for new concerns or questions as evidenced by review of documented incoming telephone call notes and patient report  -Weigh daily and record (notify MD with 3 lb weight gain over night or 5 lb in a week) -Follow CHF Action Plan -Adhere to low sodium diet Patient has follow up with cardiologist 06/23/22  Weight 180.2 lbs.    Interventions Today    Flowsheet Row Most Recent Value  Chronic Disease   Chronic disease during today's visit Congestive Heart Failure (CHF)  General Interventions   General Interventions Discussed/Reviewed General Interventions Discussed, Doctor Visits  Doctor Visits Discussed/Reviewed Doctor Visits Discussed  Education Interventions   Education Provided Provided Education  Provided Verbal Education On Nutrition, When to see the doctor, Other  [place] Discussed, Depression  Nutrition Interventions   Nutrition Discussed/Reviewed Nutrition Discussed, Decreasing salt  Pharmacy Interventions   Pharmacy Dicussed/Reviewed Pharmacy Topics Discussed, Medications and their functions  Safety Interventions    Safety Discussed/Reviewed Fall Risk, Safety Discussed              SDOH assessments and interventions completed:  Yes  SDOH Interventions Today    Flowsheet Row Most Recent Value  SDOH Interventions   Housing Interventions Intervention Not Indicated  Transportation Interventions Intervention Not Indicated        Care Coordination Interventions:  Yes, provided   Follow up plan: Follow up call scheduled for 07/01/22    Encounter Outcome:  Pt. Visit Completed   Jone Baseman, RN, MSN [place] Management Care Management Coordinator Direct Line [phone]

## 2022-06-11 NOTE — Patient Instructions (Signed)
Visit Information  Thank you for taking time to visit with me today. Please don't hesitate to contact me if I can be of assistance to you.   Following are the goals we discussed today:   Goals Addressed             This Visit's Progress    Heart Failure Management       Patient Goals/Self Care Activities: -Patient/Caregiver will self-administer medications as prescribed as evidenced by self-report/primary caregiver report  -Patient/Caregiver will attend all scheduled provider appointments as evidenced by clinician review of documented attendance to scheduled appointments and patient/caregiver report -Patient/Caregiver will call provider office for new concerns or questions as evidenced by review of documented incoming telephone call notes and patient report  -Weigh daily and record (notify MD with 3 lb weight gain over night or 5 lb in a week) -Follow CHF Action Plan -Adhere to low sodium diet Patient has follow up with cardiologist 06/23/22  Weight 180.2 lbs.    Interventions Today    Flowsheet Row Most Recent Value  Chronic Disease   Chronic disease during today's visit Congestive Heart Failure (CHF)  General Interventions   General Interventions Discussed/Reviewed General Interventions Discussed, Doctor Visits  Doctor Visits Discussed/Reviewed Doctor Visits Discussed  Education Interventions   Education Provided Provided Education  Provided Verbal Education On Nutrition, When to see the doctor, Other  [place] Discussed, Depression  Nutrition Interventions   Nutrition Discussed/Reviewed Nutrition Discussed, Decreasing salt  Pharmacy Interventions   Pharmacy Dicussed/Reviewed Pharmacy Topics Discussed, Medications and their functions  Safety Interventions   Safety Discussed/Reviewed Fall Risk, Safety Discussed              Our next appointment is by telephone on 07/01/22 at 1100  Please call the  care guide team at [phone] if you need to cancel or reschedule your appointment.   If you are experiencing a Mental Health or [place] or need someone to talk to, please call the Suicide and Crisis Lifeline: 988   The patient verbalized understanding of instructions, educational materials, and care plan provided today and agreed to receive a mailed copy of patient instructions, educational materials, and care plan.   The patient has been provided with contact information for the care management team and has been advised to call with any health related questions or concerns.   Jone Baseman, RN, MSN [place] Management Care Management Coordinator Direct Line [phone]

## 2022-06-19 DIAGNOSIS — H00015 Hordeolum externum left lower eyelid: Secondary | ICD-10-CM | POA: Diagnosis not present

## 2022-06-23 ENCOUNTER — Ambulatory Visit (HOSPITAL_COMMUNITY)
Admit: 2022-06-23 | Discharge: 2022-06-23 | Disposition: A | Discharge status: Home / Self Care | Payer: Medicare Other | Attending: Adult Health | Admitting: Adult Health

## 2022-06-23 ENCOUNTER — Encounter (HOSPITAL_COMMUNITY): Payer: Self-pay

## 2022-06-23 VITALS — BP 110/70 | HR 48 | Wt 183.4 lb

## 2022-06-23 DIAGNOSIS — E785 Hyperlipidemia, unspecified: Secondary | ICD-10-CM | POA: Insufficient documentation

## 2022-06-23 DIAGNOSIS — Z7982 Long term (current) use of aspirin: Secondary | ICD-10-CM | POA: Insufficient documentation

## 2022-06-23 DIAGNOSIS — Z87440 Personal history of urinary (tract) infections: Secondary | ICD-10-CM | POA: Diagnosis not present

## 2022-06-23 DIAGNOSIS — G4733 Obstructive sleep apnea (adult) (pediatric): Secondary | ICD-10-CM | POA: Insufficient documentation

## 2022-06-23 DIAGNOSIS — M549 Dorsalgia, unspecified: Secondary | ICD-10-CM | POA: Diagnosis not present

## 2022-06-23 DIAGNOSIS — I493 Ventricular premature depolarization: Secondary | ICD-10-CM

## 2022-06-23 DIAGNOSIS — Z8546 Personal history of malignant neoplasm of prostate: Secondary | ICD-10-CM | POA: Diagnosis not present

## 2022-06-23 DIAGNOSIS — I5022 Chronic systolic (congestive) heart failure: Secondary | ICD-10-CM

## 2022-06-23 DIAGNOSIS — I2510 Atherosclerotic heart disease of native coronary artery without angina pectoris: Secondary | ICD-10-CM | POA: Insufficient documentation

## 2022-06-23 DIAGNOSIS — K56609 Unspecified intestinal obstruction, unspecified as to partial versus complete obstruction: Secondary | ICD-10-CM

## 2022-06-23 DIAGNOSIS — Z79899 Other long term (current) drug therapy: Secondary | ICD-10-CM | POA: Diagnosis not present

## 2022-06-23 DIAGNOSIS — R001 Bradycardia, unspecified: Secondary | ICD-10-CM | POA: Diagnosis not present

## 2022-06-23 DIAGNOSIS — I5040 Unspecified combined systolic (congestive) and diastolic (congestive) heart failure: Secondary | ICD-10-CM | POA: Insufficient documentation

## 2022-06-23 DIAGNOSIS — I110 Hypertensive heart disease with heart failure: Secondary | ICD-10-CM | POA: Insufficient documentation

## 2022-06-23 LAB — BASIC METABOLIC PANEL
Anion gap: 11 (ref 5–15)
BUN: 50 mg/dL — ABNORMAL HIGH (ref 8–23)
CO2: 26 mmol/L (ref 22–32)
Calcium: 9 mg/dL (ref 8.9–10.3)
Chloride: 98 mmol/L (ref 98–111)
Creatinine, Ser: 1.39 mg/dL — ABNORMAL HIGH (ref 0.61–1.24)
GFR, Estimated: 49 mL/min — ABNORMAL LOW (ref >60)
Glucose, Bld: 92 mg/dL (ref 70–99)
Potassium: 4.2 mmol/L (ref 3.5–5.1)
Sodium: 135 mmol/L (ref 135–145)

## 2022-06-23 MED ORDER — FUROSEMIDE 20 MG PO TABS: 20.0000 mg | ORAL_TABLET | Freq: Every day | ORAL | 1 refills | Status: DC

## 2022-06-23 NOTE — Progress Notes (Signed)
HEART & VASCULAR TRANSITION OF CARE CONSULT NOTE     Referring Physician:Dr Ogbata Primary Care: Dr Durene Cal  Primary Cardiologist: Dr Swaziland Oncology: Dr Leonides Schanz  HPI: Referred to clinic by Dr Dartha Lodge for heart failure consultation.   Jose Ware is a 88 year old with a history of prostate cancer, OSA, HLD, CAD, ICM, PVCs. HTN, and combined HFpEF/HFrEF.   Admitted to [facility] 2017 with STEMI. Cath revealed a very large LCx with extensive thrombus. He underwent PCI with DES. With reperfusion he had NSVT. The pt also had residual RCA and LAD disease and an EF of 25% at cath. Echo done 11/05/15 showed an EF of 40-45%. On 11/06/15 he was taken back to the lab for staged PCI to his LAD and RCA. This was successful but on 11/07/15 the pt related that he developed some word finding issues that started post PCI 8/29. MRI revealed scattered small acute infarcts in the bilateral anterior and posterior circulation.  CA dopplers revealed mild plaque (1-39%).   Admitted January 2024 with SBO  secondary to adhesions from previous abdominal surgeries. Concern for UTI/prostatitis. Given antibiotics but later stopped with negative culture.   Admitted with 05/31/22  A/C combined HF. Presented with increased dyspnea and leg edema. Diuresed with IV lasix and transitioned to lasix 20 mg po twice a day. Echo EF 40-45%. Carvedilol was held on the day discharge for bradycardia. Discharge weight 185 pounds.   He presents with his wife. Overall feeling fine. Limited by back/knee pain. Denies SOB/PND/Orthopnea. Appetite ok. No fever or chills. Weight at home  179---> 173 pounds. Taking all medications.   Cardiac Testing  Echo 05/2022   1. Inferior distal septal and apical hypokinesis . Left ventricular  ejection fraction, by estimation, is 40 to 45%. The left ventricle has  mildly decreased function. The left ventricle has no regional wall motion  abnormalities. The left ventricular  internal cavity size was mildly  dilated. There is mild left ventricular  hypertrophy. Left ventricular diastolic parameters are indeterminate.   2. Right ventricular systolic function is normal. The right ventricular  size is normal. There is mildly elevated pulmonary artery systolic  pressure.   3. Left atrial size was moderately dilated.   4. The mitral valve is abnormal. Mild mitral valve regurgitation. No  evidence of mitral stenosis.   5. The aortic valve is tricuspid. There is moderate calcification of the  aortic valve. There is moderate thickening of the aortic valve. Aortic  valve regurgitation is not visualized. Mild to moderate   Review of Systems: [y] = yes, [ ]  = no   General: Weight gain [ ] ; Weight loss [ ] ; Anorexia [ ] ; Fatigue [ ] ; Fever [ ] ; Chills [ ] ; Weakness [ ]   Cardiac: Chest pain/pressure [ ] ; Resting SOB [ ] ; Exertional SOB [ ] ; Orthopnea [ ] ; Pedal Edema [ ] ; Palpitations [ ] ; Syncope [ ] ; Presyncope [ ] ; Paroxysmal nocturnal dyspnea[ ]   Pulmonary: Cough [ ] ; Wheezing[ ] ; Hemoptysis[ ] ; Sputum [ ] ; Snoring [ ]   GI: Vomiting[ ] ; Dysphagia[ ] ; Melena[ ] ; Hematochezia [ ] ; Heartburn[ ] ; Abdominal pain [ ] ; Constipation [ ] ; Diarrhea [ ] ; BRBPR [ ]   GU: Hematuria[ ] ; Dysuria [ ] ; Nocturia[ ]   Vascular: Pain in legs with walking [ ] ; Pain in feet with lying flat [ ] ; Non-healing sores [ ] ; Stroke [ ] ; TIA [ ] ; Slurred speech [ ] ;  Neuro: Headaches[ ] ; Vertigo[ ] ; Seizures[ ] ; Paresthesias[ ] ;Blurred vision [ ] ;  Diplopia ; Vision changes   Ortho/Skin: Arthritis ; Joint pain [Y ]; Muscle pain ; Joint swelling ; Back Pain [ Y]; Rash   Psych: Depression[ ] ; Anxiety[ ]   Heme: Bleeding problems ; Clotting disorders ; Anemia   Endocrine: Diabetes ; Thyroid dysfunction[ ]    Past Medical History:  Diagnosis Date   Adenocarcinoma of prostate    XRT & Radiation seed implantation in 2010   Adenomatous colon polyp    CAD (coronary artery disease)    a. 11/04/15:  Acute  inferolateral STEMI: S/p emergent DES of a very large LCx with extensive thrombus. Significant residual disease in the proximal LAD and distal RCA. S/p staged PCI of LAD and RCA 8/29.   Cataract    CVA (cerebral infarction)    a. 10/2015: embolic CVA after heart cath    CVA (cerebral infarction)    a. 10/2015: embolic CVA after heart cath    DIVERTICULITIS, HX OF 11/27/2006        DJD (degenerative joint disease)    wrist - R    Hernia    History of blood transfusion 1985   with colon surgery   History of kidney stones    Hypertension    Ischemic cardiomyopathy    a. EF 25-30% by LV gram on cath 11/04/15. Appeared out of proportion to infarct although infarct was large. EF improved by Echo and now 40-45%.   Myocardial infarction 2019   NEPHROLITHIASIS, HX OF 11/27/2006         Non Hodgkin's lymphoma 11/2020   watching at this point, in abdomen, no meds or chemo yet as of 01-27-2022   Peripheral neuropathy    pt denies   Pneumonia    as a child   Psoriasis    Sleep apnea    wears cpap   Stroke    no residuals   Tenosynovitis     Current Outpatient Medications  Medication Sig Dispense Refill   acetaminophen (TYLENOL) 650 MG CR tablet Take 650 mg by mouth every 4 (four) hours as needed for pain.     amLODipine (NORVASC) 5 MG tablet Take 1 tablet by mouth once daily 90 tablet 0   aspirin EC 81 MG tablet Take 1 tablet (81 mg total) by mouth daily. 90 tablet 1   atorvastatin (LIPITOR) 10 MG tablet Take 1 tablet (10 mg total) by mouth every other day. 43 tablet 3   carvedilol (COREG) 3.125 MG tablet Take 1 tablet by mouth twice daily (Patient taking differently: Take 3.125 mg by mouth 2 (two) times daily.) 180 tablet 3   Cyanocobalamin (VITAMIN B-12) 5000 MCG TBDP Take 5,000 mcg by mouth every morning.     docusate sodium (COLACE) 100 MG capsule Take 100 mg by mouth 2 (two) times daily.     doxycycline (VIBRAMYCIN) 100 MG capsule Take 100 mg by mouth 2 (two) times daily.      erythromycin ophthalmic ointment 3 (three) times daily.     furosemide (LASIX) 20 MG tablet Take 1 tablet (20 mg total) by mouth 2 (two) times daily. 60 tablet 0   Multiple Vitamins-Minerals (OCUVITE ADULT 50+ PO) Take 1 tablet by mouth daily.     naphazoline-pheniramine (NAPHCON-A) 0.025-0.3 % ophthalmic solution Place 2 drops into both eyes 4 (four) times daily as needed for eye irritation or allergies. 15 mL 0   nitroGLYCERIN (NITROSTAT) 0.4 MG SL tablet Place 1 tablet (0.4 mg total)  under the tongue every 5 (five) minutes x 3 doses as needed for chest pain. 25 tablet 2   phenol (CHLORASEPTIC) 1.4 % LIQD Use as directed 1 spray in the mouth or throat as needed for throat irritation / pain. 20 mL 0   polyethylene glycol (MIRALAX / GLYCOLAX) 17 g packet Take 17 g by mouth daily. (Patient taking differently: Take 17 g by mouth every other day.) 14 each 0   potassium chloride SA (KLOR-CON M) 20 MEQ tablet Take 1 tablet (20 mEq total) by mouth daily for 14 days. 14 tablet 0   PREVIDENT 5000 DRY MOUTH 1.1 % GEL dental gel Place 1 Application onto teeth at bedtime.     spironolactone (ALDACTONE) 25 MG tablet Take 25 mg by mouth daily. Per cardiology     No current facility-administered medications for this encounter.    Allergies  Allergen Reactions   Bee Venom Swelling    Facial swelling   Betadine [Povidone Iodine] Other (See Comments)    blistering   Doxycycline     Possible drug rash- back of right lower leg and onto left lower leg as well      Social History   Socioeconomic History   Marital status: Married    Spouse name: Not on file   Number of children: 3   Years of education: Not on file   Highest education level: Not on file  Occupational History   Not on file  Tobacco Use   Smoking status: Former    Packs/day: 2.00    Years: 30.00    Additional pack years: 0.00    Total pack years: 60.00    Types: Cigarettes    Quit date: 03/11/1983    Years since quitting: 39.3    Smokeless tobacco: Never   Tobacco comments:    started smoking in the service; ICU 85' part of his stomach; appendix; coma   Vaping Use   Vaping Use: Never used  Substance and Sexual Activity   Alcohol use: Not Currently    Comment: RARE, maybe one drink a couple times a year   Drug use: Never   Sexual activity: Not Currently  Other Topics Concern   Not on file  Social History Narrative   [employer], [place].Married 60 - Divorced 1996; remarried '03 different wife3 sons - '63, '65, '67Grandchildren -14; 1 great grand daughter; 4 great grands on wife's side, 1 great great granddaughter      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      Hobbies: fishing, busy volunteering   Social Determinants of Health   Financial Resource Strain: Low Risk  (05/20/2022)   Overall Financial Resource Strain (CARDIA)    Difficulty of Paying Living Expenses: Not hard at all  Food Insecurity: No Food Insecurity (06/05/2022)   Hunger Vital Sign    Worried About Running Out of Food in the Last Year: Never true    Ran Out of Food in the Last Year: Never true  Transportation Needs: No Transportation Needs (06/11/2022)   PRAPARE - Administrator, Civil Service (Medical): No    Lack of Transportation (Non-Medical): No  Physical Activity: Inactive (05/20/2022)   Exercise Vital Sign    Days of Exercise per Week: 0 days    Minutes of Exercise per Session: 0 min  Stress: No Stress Concern Present (05/20/2022)   [employer] of Occupational Health - Occupational Stress Questionnaire    Feeling of Stress :  Not at all  Social Connections: Socially Integrated (05/20/2022)   Social Connection and Isolation Panel [NHANES]    Frequency of Communication with Friends and Family: More than three times a week    Frequency of Social Gatherings with Friends and Family: More than three times a week    Attends Religious Services: More than 4 times per year    Active Member  of [employer] or Organizations: Yes    Attends Banker Meetings: 1 to 4 times per year    Marital Status: Married  Catering manager Violence: Not At Risk (05/31/2022)   Humiliation, Afraid, Rape, and Kick questionnaire    Fear of Current or Ex-Partner: No    Emotionally Abused: No    Physically Abused: No    Sexually Abused: No      Family History  Problem Relation Age of Onset   Coronary artery disease Mother    Alcohol abuse Father    Cirrhosis Father    Heart failure Sister    Breast cancer Sister        W/involvement of right arm leading to amputation   Anemia Sister        related to treatment to lymphoma   Non-Hodgkin's lymphoma Sister    Other Sister        died from covid Jul 19, 2020   Coronary artery disease Brother    Heart attack Brother    Non-Hodgkin's lymphoma Brother    Clotting disorder Brother    Non-Hodgkin's lymphoma Brother    Prostate cancer Neg Hx    Colon cancer Neg Hx    Diabetes Neg Hx    Glaucoma Neg Hx    Rectal cancer Neg Hx    Esophageal cancer Neg Hx    Colon polyps Neg Hx    Stomach cancer Neg Hx     Vitals:   06/23/22 1143  BP: 110/70  Pulse: (!) 48  SpO2: 98%  Weight: 83.2 kg (183 lb 6.4 oz)   Wt Readings from Last 3 Encounters:  06/23/22 83.2 kg (183 lb 6.4 oz)  06/04/22 84.1 kg (185 lb 6.5 oz)  05/20/22 83 kg (183 lb)     PHYSICAL EXAM: General:  Walked in the clinic. No respiratory difficulty HEENT: normal Neck: supple. no JVD. Carotids 2+ bilat; no bruits. No lymphadenopathy or thryomegaly appreciated. Cor: PMI nondisplaced. Regular rate & rhythm. No rubs, gallops or murmurs. Lungs: clear Abdomen: soft, nontender, nondistended. No hepatosplenomegaly. No bruits or masses. Good bowel sounds. Extremities: no cyanosis, clubbing, rash, edema Neuro: alert & oriented x 3, cranial nerves grossly intact. moves all 4 extremities w/o difficulty. Affect pleasant.  ECG: Sinusus Brady with occasional PVCs   ASSESSMENT &  PLAN: 1. Chronic Combined HFrEF/HFpEF Echo 05/2022 Ef 40-45-% RV normal consistent with previous.  NYHA II. Functionally limited by back pain.  GDMT  Diuretic- Reds Clip 32%. Weight has been trending down. On exam he appears dry. Cut back lasix to 20 mg daily with extra 20 mg for weight 178 or greater.   BB- Continue coreg 3.125 mg twice a day  Ace/ARB/ARNI- SBP has been soft. Consider stopping amlodipine next visit.  MRA- Continue spironolactone 25 mg daily  SGLT2i- Hold off recent UTI Check BMET   2. OSA Continue CPAP daily  3. Bradycardia EKG - Sinus Huston Foley with occasional PVCs.  May need to stop bb but asymptomatic.   4. PVCs  Continue bb + CPAP  5. CAD 20-Jul-2015 s/p  thrombotic occlusion of the LCx.  S/P followed by staged PCI of the proximal LAD and RCA.  No chest pain. Continue aspirin, statin, bb.    Referred to HFSW (PCP, Medications, Transportation, ETOH Abuse, Drug Abuse, Insurance, Financial ): No  Refer to Pharmacy: No Refer to Home Health:  No Refer to Advanced Heart Failure Clinic: No Refer to General Cardiology: Existing patient of Dr Swaziland   Follow up 3 weeks then back to Dr Swaziland.   Jeffie Widdowson NP-C  12:56 PM

## 2022-06-23 NOTE — Patient Instructions (Signed)
EKG done today.  RedsClip done today.   Labs done today. We will contact you only if your labs are abnormal.  DECREASE Lasix to 20mg  (1 tablet) by mouth daily. You may take 1 additional tablet as needed for a weight gain of 178 or more.   No other medication changes were made. Please continue all current medications as prescribed.  You have been referred to a Nutritionist. They will contact you to schedule an appointment.   Your physician recommends that you schedule a follow-up appointment in: 3 weeks  If you have any questions or concerns before your next appointment please send [place] a message through [place] or call our office at [phone].    TO LEAVE A MESSAGE FOR THE NURSE SELECT OPTION 2, PLEASE LEAVE A MESSAGE INCLUDING: YOUR NAME DATE OF BIRTH CALL BACK NUMBER REASON FOR CALL**this is important as we prioritize the call backs  YOU WILL RECEIVE A CALL BACK THE SAME DAY AS LONG AS YOU CALL BEFORE 4:00 PM   Do the following things EVERYDAY: Weigh yourself in the morning before breakfast. Write it down and keep it in a log. Take your medicines as prescribed Eat low salt foods--Limit salt (sodium) to 2000 mg per day.  Stay as active as you can everyday Limit all fluids for the day to less than 2 liters   At the [facility], you and your health needs are our priority. As part of our continuing mission to provide you with exceptional heart care, we have created designated Provider Care Teams. These Care Teams include your primary Cardiologist (physician) and Advanced Practice Providers (APPs- Physician Assistants and Nurse Practitioners) who all work together to provide you with the care you need, when you need it.   You may see any of the following providers on your designated Care Team at your next follow up: Dr Arvilla Meres Dr Marca Ancona Dr. Marcos Eke, NP Robbie Lis, [facility] [place], [place] Brynda Peon,  NP Karle Plumber, PharmD   Please be sure to bring in all your medications bottles to every appointment.    Thank you for choosing  [facility]

## 2022-06-23 NOTE — Progress Notes (Signed)
ReDS Vest / Clip - 06/23/22 1200       ReDS Vest / Clip   Station Marker D    Ruler Value 36    ReDS Value Range Low volume    ReDS Actual Value 32    Anatomical Comments sitting

## 2022-06-24 ENCOUNTER — Telehealth: Payer: Self-pay | Admitting: Pharmacist

## 2022-06-24 NOTE — Progress Notes (Signed)
Care Management & Coordination Services Pharmacy Team  Reason for Encounter: General adherence update   Contacted patient for general health update and medication adherence call.  Spoke with patient on 06/24/2022    What concerns do you have about your medications? None  The patient denies side effects with their medications.   How often do you forget or accidentally miss a dose? Rarely  Do you use a pillbox? Yes  Are you having any problems getting your medications from your pharmacy? No  Has the cost of your medications been a concern? No If yes, what medication and is patient assistance available or has it been applied for?  Since last visit with PharmD, no interventions have been made.   The patient has had an ED visit since last contact.   The patient reports the following problems with their health. Patient states he has been feeling weak. He states he has been trying to get around and move his body more to build strength.  Patient denies concerns or questions for Erskine Emery, PharmD at this time.   Counseled patient on: Access to carecoordination team for any cost, medication or pharmacy concerns.   Chart Updates:  Recent office visits:  04/04/2022 OV (PCP) Shelva Majestic, MD; no medication changes indicated.  Recent consult visits:  04/14/2022 OV (Cardiology) Swaziland, Peter M, MD; no medication changes indicated.  Hospital visits:  05/31/2022 ED to Hospital Admission due to Acute on chronic combined systolic and diastolic congestive heart failure  03/20/2022 ED to Hospital Admission due to SBO -discontinue Losartan  Medications: Outpatient Encounter Medications as of 06/24/2022  Medication Sig   acetaminophen (TYLENOL) 650 MG CR tablet Take 650 mg by mouth every 4 (four) hours as needed for pain.   amLODipine (NORVASC) 5 MG tablet Take 1 tablet by mouth once daily   aspirin EC 81 MG tablet Take 1 tablet (81 mg total) by mouth daily.   atorvastatin (LIPITOR) 10  MG tablet Take 1 tablet (10 mg total) by mouth every other day.   carvedilol (COREG) 3.125 MG tablet Take 1 tablet by mouth twice daily (Patient taking differently: Take 3.125 mg by mouth 2 (two) times daily.)   Cyanocobalamin (VITAMIN B-12) 5000 MCG TBDP Take 5,000 mcg by mouth every morning.   docusate sodium (COLACE) 100 MG capsule Take 100 mg by mouth 2 (two) times daily.   doxycycline (VIBRAMYCIN) 100 MG capsule Take 100 mg by mouth 2 (two) times daily.   erythromycin ophthalmic ointment 3 (three) times daily.   furosemide (LASIX) 20 MG tablet Take 1 tablet (20 mg total) by mouth daily. Take 1 additional tablet by mouth for a weight gain of 178 or greater   Multiple Vitamins-Minerals (OCUVITE ADULT 50+ PO) Take 1 tablet by mouth daily.   naphazoline-pheniramine (NAPHCON-A) 0.025-0.3 % ophthalmic solution Place 2 drops into both eyes 4 (four) times daily as needed for eye irritation or allergies.   nitroGLYCERIN (NITROSTAT) 0.4 MG SL tablet Place 1 tablet (0.4 mg total) under the tongue every 5 (five) minutes x 3 doses as needed for chest pain.   phenol (CHLORASEPTIC) 1.4 % LIQD Use as directed 1 spray in the mouth or throat as needed for throat irritation / pain.   polyethylene glycol (MIRALAX / GLYCOLAX) 17 g packet Take 17 g by mouth daily. (Patient taking differently: Take 17 g by mouth every other day.)   potassium chloride SA (KLOR-CON M) 20 MEQ tablet Take 1 tablet (20 mEq total) by mouth daily for  14 days.   PREVIDENT 5000 DRY MOUTH 1.1 % GEL dental gel Place 1 Application onto teeth at bedtime.   spironolactone (ALDACTONE) 25 MG tablet Take 25 mg by mouth daily. Per cardiology   No facility-administered encounter medications on file as of 06/24/2022.    Recent vitals BP Readings from Last 3 Encounters:  06/23/22 110/70  06/04/22 129/87  04/14/22 (!) 118/50   Pulse Readings from Last 3 Encounters:  06/23/22 (!) 48  06/04/22 (!) 59  04/14/22 63   Wt Readings from Last 3  Encounters:  06/23/22 183 lb 6.4 oz (83.2 kg)  06/04/22 185 lb 6.5 oz (84.1 kg)  05/20/22 183 lb (83 kg)   BMI Readings from Last 3 Encounters:  06/23/22 24.20 kg/m  06/04/22 24.46 kg/m  05/20/22 24.14 kg/m    Recent lab results    Component Value Date/Time   NA 135 06/23/2022 1226   NA 140 05/06/2018 1103   K 4.2 06/23/2022 1226   CL 98 06/23/2022 1226   CO2 26 06/23/2022 1226   GLUCOSE 92 06/23/2022 1226   BUN 50 (H) 06/23/2022 1226   BUN 27 05/06/2018 1103   CREATININE 1.39 (H) 06/23/2022 1226   CREATININE 1.10 12/23/2019 1204   CALCIUM 9.0 06/23/2022 1226    Lab Results  Component Value Date   CREATININE 1.39 (H) 06/23/2022   GFR 70.14 04/04/2022   GFRNONAA 49 (L) 06/23/2022   GFRAA 72 12/23/2019   Lab Results  Component Value Date/Time   HGBA1C 5.3 11/04/2015 05:45 PM    Lab Results  Component Value Date   CHOL 68 03/28/2022   HDL 20 (L) 03/28/2022   LDLCALC 37 03/28/2022   LDLDIRECT 33.0 06/25/2020   TRIG 56 03/28/2022   CHOLHDL 3.4 03/28/2022    Care Gaps: Annual wellness visit in last year? Yes  Star Rating Drugs:  Atorvastatin 10 mg last filled 04/19/2022 86 DS Losartan 100 mg last filled 04/19/2022 90 DS   Future Appointments  Date Time Provider Department Center  07/01/2022 11:00 AM Fleeta Emmer, RN [facility] None  07/10/2022  9:00 AM Shelva Majestic, MD [facility]  07/14/2022 10:00 AM [facility] None  07/29/2022  9:30 AM Willa Frater L, RPH [facility] None  08/06/2022 10:45 AM [facility] LAB [facility] None  08/06/2022 11:20 AM Jaci Standard, MD [facility] None  06/01/2023 10:30 AM [facility] ANNUAL WELLNESS VISIT 1 [facility]    April D Calhoun, [facility] Clinical Pharmacist Assistant [phone]

## 2022-07-01 ENCOUNTER — Ambulatory Visit: Payer: Self-pay

## 2022-07-01 NOTE — Progress Notes (Signed)
I have personally reviewed this encounter including the documentation in this note and have collaborated with the care management provider regarding care management and care coordination activities to include development and update of the comprehensive care plan. I am certifying that I agree with the content of this note and encounter as supervising physician. ° °Nyonna Hargrove, MD  °

## 2022-07-01 NOTE — Patient Instructions (Signed)
Visit Information  Thank you for taking time to visit with me today. Please don't hesitate to contact me if I can be of assistance to you.   Following are the goals we discussed today:   Goals Addressed             This Visit's Progress    Heart Failure Management       Patient Goals/Self Care Activities: -Patient/Caregiver will self-administer medications as prescribed as evidenced by self-report/primary caregiver report  -Patient/Caregiver will attend all scheduled provider appointments as evidenced by clinician review of documented attendance to scheduled appointments and patient/caregiver report -Patient/Caregiver will call provider office for new concerns or questions as evidenced by review of documented incoming telephone call notes and patient report  -Weigh daily and record (notify MD with 3 lb weight gain over night or 5 lb in a week) -Follow CHF Action Plan -Adhere to low sodium diet Patient has appointment with PCP on 07/10/22  Weight 175lbs.    Interventions Today    Flowsheet Row Most Recent Value  Chronic Disease   Chronic disease during today's visit Congestive Heart Failure (CHF)  General Interventions   General Interventions Discussed/Reviewed General Interventions Reviewed, Doctor Visits  Doctor Visits Discussed/Reviewed Doctor Visits Reviewed  Education Interventions   Education Provided Provided Education  Provided Verbal Education On Nutrition, When to see the doctor  [Heart failure management.  Weight gain of 2-3 in a day or 5 lbs in a week to notify physician.]  Nutrition Interventions   Nutrition Discussed/Reviewed Nutrition Discussed, Decreasing fats, Decreasing salt              Our next appointment is by telephone on 07/29/22 at 1030  Please call the care guide team at [phone] if you need to cancel or reschedule your appointment.   If you are experiencing a Mental Health or Behavioral Health Crisis or need someone to talk to, please call  the Suicide and Crisis Lifeline: 988   Patient verbalizes understanding of instructions and care plan provided today and agrees to view in MyChart. Active MyChart status and patient understanding of how to access instructions and care plan via MyChart confirmed with patient.     The patient has been provided with contact information for the care management team and has been advised to call with any health related questions or concerns.   Bary Leriche, RN, MSN [facility] Care Management Care Management Coordinator Direct Line [phone]

## 2022-07-01 NOTE — Patient Outreach (Signed)
  Care Coordination   Follow Up Visit Note   07/01/2022 Name: Jose Ware MRN: [id number] DOB: 03-03-1935  Jose Ware is a 88 y.o. year old male who sees Durene Cal, Aldine Contes, MD for primary care. I spoke with  Jose Ware by phone today.Patient reports doing really well.   What matters to the patients health and wellness today?  Heart failure management    Goals Addressed             This Visit's Progress    Heart Failure Management       Patient Goals/Self Care Activities: -Patient/Caregiver will self-administer medications as prescribed as evidenced by self-report/primary caregiver report  -Patient/Caregiver will attend all scheduled provider appointments as evidenced by clinician review of documented attendance to scheduled appointments and patient/caregiver report -Patient/Caregiver will call provider office for new concerns or questions as evidenced by review of documented incoming telephone call notes and patient report  -Weigh daily and record (notify MD with 3 lb weight gain over night or 5 lb in a week) -Follow CHF Action Plan -Adhere to low sodium diet Patient has appointment with PCP on 07/10/22  Weight 175lbs.    Interventions Today    Flowsheet Row Most Recent Value  Chronic Disease   Chronic disease during today's visit Congestive Heart Failure (CHF)  General Interventions   General Interventions Discussed/Reviewed General Interventions Reviewed, Doctor Visits  Doctor Visits Discussed/Reviewed Doctor Visits Reviewed  Education Interventions   Education Provided Provided Education  Provided Verbal Education On Nutrition, When to see the doctor  [Heart failure management.  Weight gain of 2-3 in a day or 5 lbs in a week to notify physician.]  Nutrition Interventions   Nutrition Discussed/Reviewed Nutrition Discussed, Decreasing fats, Decreasing salt              SDOH assessments and interventions completed:  Yes     Care Coordination  Interventions:  Yes, provided   Follow up plan: Follow up call scheduled for May    Encounter Outcome:  Pt. Visit Completed   Bary Leriche, RN, MSN [facility] Care Management Care Management Coordinator Direct Line [phone]

## 2022-07-10 ENCOUNTER — Encounter: Payer: Self-pay | Admitting: Family Medicine

## 2022-07-10 ENCOUNTER — Ambulatory Visit (INDEPENDENT_AMBULATORY_CARE_PROVIDER_SITE_OTHER): Payer: Medicare Other | Admitting: Family Medicine

## 2022-07-10 VITALS — BP 112/64 | HR 47 | Temp 97.5°F | Ht 73.0 in | Wt 184.2 lb

## 2022-07-10 DIAGNOSIS — I2510 Atherosclerotic heart disease of native coronary artery without angina pectoris: Secondary | ICD-10-CM | POA: Diagnosis not present

## 2022-07-10 DIAGNOSIS — I5042 Chronic combined systolic (congestive) and diastolic (congestive) heart failure: Secondary | ICD-10-CM

## 2022-07-10 DIAGNOSIS — E785 Hyperlipidemia, unspecified: Secondary | ICD-10-CM

## 2022-07-10 DIAGNOSIS — I255 Ischemic cardiomyopathy: Secondary | ICD-10-CM

## 2022-07-10 DIAGNOSIS — I10 Essential (primary) hypertension: Secondary | ICD-10-CM | POA: Diagnosis not present

## 2022-07-10 NOTE — Progress Notes (Signed)
Phone [phone] In person visit   Subjective:   Jose Ware is a 88 y.o. year old very pleasant male patient who presents for/with See problem oriented charting Chief Complaint  Patient presents with   Medical Management of Chronic Issues   Hypertension    Past Medical History-  Patient Active Problem List   Diagnosis Date Noted   Chronic heart failure (HCC) 05/31/2022    Priority: High   Bigeminy 05/31/2022    Priority: High   Follicular lymphoma, unspecified, intra-abdominal lymph nodes (HCC) 04/11/2021    Priority: High   History of small bowel obstruction 11/13/2020    Priority: High   Coronary artery disease involving native coronary artery of native heart without angina pectoris     Priority: High   Ischemic cardiomyopathy     Priority: High   History of embolic stroke     Priority: High   History of primary hyperparathyroidism s/p parathyroidectomy (benign adenoma) 12/28/2013    Priority: High   Fatigue 12/01/2013    Priority: High   History of prostate cancer 12/14/2008    Priority: High   OSA on CPAP 05/08/2020    Priority: Medium    Spinal stenosis of lumbar region at multiple levels 04/30/2017    Priority: Medium    Dyslipidemia 11/16/2015    Priority: Medium    Hx of adenomatous colonic polyps 01/18/2015    Priority: Medium    B12 deficiency 09/17/2010    Priority: Medium    PSORIASIS 10/15/2007    Priority: Medium    Essential hypertension 11/27/2006    Priority: Medium    Onychomycosis 10/28/2017    Priority: Low   Post herpetic neuralgia 06/20/2017    Priority: Low   Drug reaction 02/05/2017    Priority: Low   Senile purpura (HCC) 02/05/2017    Priority: Low   History of ventricular tachycardia 11/08/2015    Priority: Low   History of stomach cancer 12/01/2013    Priority: Low   Lower back pain 03/24/2013    Priority: Low   Bradycardia 09/23/2012    Priority: Low   PERIPHERAL NEUROPATHY 10/18/2007    Priority: Low    DEGENERATIVE JOINT DISEASE 11/27/2006    Priority: Low   NEPHROLITHIASIS, HX OF 11/27/2006    Priority: Low   Pain of back and left lower extremity 06/08/2019    Priority: 1.   Left knee pain 06/06/2019    Priority: 1.   Spondylosis 06/25/2017    Priority: 1.   Post-traumatic osteoarthritis of right wrist 04/25/2015    Priority: 1.    Medications- reviewed and updated Current Outpatient Medications  Medication Sig Dispense Refill   acetaminophen (TYLENOL) 650 MG CR tablet Take 650 mg by mouth every 4 (four) hours as needed for pain.     amLODipine (NORVASC) 5 MG tablet Take 1 tablet by mouth once daily 90 tablet 0   aspirin EC 81 MG tablet Take 1 tablet (81 mg total) by mouth daily. 90 tablet 1   atorvastatin (LIPITOR) 10 MG tablet Take 1 tablet (10 mg total) by mouth every other day. 43 tablet 3   carvedilol (COREG) 3.125 MG tablet Take 1 tablet by mouth twice daily (Patient taking differently: Take 3.125 mg by mouth 2 (two) times daily.) 180 tablet 3   Cyanocobalamin (VITAMIN B-12) 5000 MCG TBDP Take 5,000 mcg by mouth every morning.     docusate sodium (COLACE) 100 MG capsule Take 100 mg by mouth 2 (two) times daily.  furosemide (LASIX) 20 MG tablet Take 1 tablet (20 mg total) by mouth daily. Take 1 additional tablet by mouth for a weight gain of 178 or greater 60 tablet 1   Multiple Vitamins-Minerals (OCUVITE ADULT 50+ PO) Take 1 tablet by mouth daily.     naphazoline-pheniramine (NAPHCON-A) 0.025-0.3 % ophthalmic solution Place 2 drops into both eyes 4 (four) times daily as needed for eye irritation or allergies. 15 mL 0   nitroGLYCERIN (NITROSTAT) 0.4 MG SL tablet Place 1 tablet (0.4 mg total) under the tongue every 5 (five) minutes x 3 doses as needed for chest pain. 25 tablet 2   phenol (CHLORASEPTIC) 1.4 % LIQD Use as directed 1 spray in the mouth or throat as needed for throat irritation / pain. 20 mL 0   polyethylene glycol (MIRALAX / GLYCOLAX) 17 g packet Take 17 g by  mouth daily. (Patient taking differently: Take 17 g by mouth every other day.) 14 each 0   PREVIDENT 5000 DRY MOUTH 1.1 % GEL dental gel Place 1 Application onto teeth at bedtime.     spironolactone (ALDACTONE) 25 MG tablet Take 25 mg by mouth daily. Per cardiology     No current facility-administered medications for this visit.     Objective:  BP 112/64   Pulse (!) 47   Temp (!) 97.5 F (36.4 C)   Ht 6\' 1"  (1.854 m)   Wt 184 lb 3.2 oz (83.6 kg)   SpO2 98%   BMI 24.30 kg/m  Gen: NAD, resting comfortably CV: bradycardic but regular Lungs: CTAB no crackles, wheeze, rhonchi Abdomen: soft/nontender/nondistended/normal bowel sounds.  Ext: trace edema Skin: warm, dry     Assessment and Plan   # Hospitalization-late March 2024 hospitalized with acute on chronic heart failure-systolic and diastolic.  Had progressively worsening shortness of breath associated chest pressure and lower extremity edema.  Found to have elevated BNP and chest x-ray concerning for edema.  He was treated with IV diuretics and transition to oral before discharge- started lasix 20 mg twice daily but with recent cardiology follow up reduced to daily unless weight goes up would take twice daily as below  -also had SBO in January- doing much better- taking miralax to keep flow going  #Non hodgkins lymphoma/follicular lymphoma- diagnosed 2022- monitoring with Dr. Leonides Schanz- last visit November 2023 with upcoming follow up    # Ischemic LV dysfunction  #CHF as above #Hypertension S: medication: spironolactone 25 mg, Coreg 3.125 mg twice daily, amlodipine 5 mg, lasix 20 mg daily unless weight  over 178 at home- running usually mid 170s though -prior losartan 100 mg but held after SBO hospitalization march 2024 (though this creatinine increase weas very mild and could restart if needed) -blood pressure around 110-120 at home- no lightheadedness-and can get up to mid 130s A/P: CHF appears euvolemic- continue current  medications and monitor weight- go up on lasix to twice daily if weight above 178 Hypertension- at goal- considered reducing dose but some readings into high 130s at home so no change planned -offered repeat BMP with last creatinine slightly worse- he wants to hold off- onlesss lasix which will likely help  #Hyperlipidemia/CAD status post PCI after STEMI August 2017 S: Patient is compliant with atorvastatin 10 mg every other day -He is also compliant with aspirin 81mg  -no worsening chest pain pattern- very rarely will feel chest pain with prolonged standing or walking. Stable shortness of breath  Lab Results  Component Value Date   CHOL 68 03/28/2022  HDL 20 (L) 03/28/2022   LDLCALC 37 03/28/2022   LDLDIRECT 33.0 06/25/2020   TRIG 56 03/28/2022   CHOLHDL 3.4 03/28/2022  A/P: CAD largely asymptomatic continue current medications  Lipids at goal- continue current medications    #OSA on CPAP- started sept 2022 follows with Dr. Craige Cotta- compliant  Recommended follow up: Return in about 6 months (around 01/10/2023) for followup or sooner if needed.Schedule b4 you leave. Future Appointments  Date Time Provider Department Center  07/14/2022 10:00 AM [facility] None  07/29/2022  9:30 AM Erroll Luna, RPH [facility] None  07/29/2022 10:30 AM Fleeta Emmer, RN [facility] None  08/06/2022 10:45 AM [facility] None  08/06/2022 11:20 AM Jaci Standard, MD [facility] None  06/01/2023 10:30 AM [facility] ANNUAL WELLNESS VISIT 1 [facility]   Lab/Order associations:   ICD-10-CM   1. Chronic combined systolic and diastolic heart failure (HCC)  X91.47     2. Coronary artery disease involving native coronary artery of native heart without angina pectoris  I25.10     3. Essential hypertension  I10     4. Dyslipidemia  E78.5     5. Ischemic cardiomyopathy  I25.5      No orders of the defined types were placed in this encounter.  Return precautions advised.   Tana Conch, MD

## 2022-07-10 NOTE — Patient Instructions (Addendum)
Next COVID shot in fall when new vaccine comes out  If blood pressure consistently below 115 or you feel lightheaded and regularly below 120 or so could cut amlodipine in half to 2.5 mg  I would lean more towards the heart healthy diet instead of the small bowel diet at this point unless you have recurrent stomach issues but have been doing well lately

## 2022-07-14 ENCOUNTER — Encounter (HOSPITAL_COMMUNITY): Payer: Self-pay

## 2022-07-14 ENCOUNTER — Ambulatory Visit (HOSPITAL_COMMUNITY)
Admission: RE | Admit: 2022-07-14 | Discharge: 2022-07-14 | Disposition: A | Discharge status: Home / Self Care | Payer: Medicare Other | Source: Ambulatory Visit | Attending: Cardiology | Admitting: Cardiology

## 2022-07-14 VITALS — BP 128/74 | HR 54 | Ht 73.0 in | Wt 186.6 lb

## 2022-07-14 DIAGNOSIS — I340 Nonrheumatic mitral (valve) insufficiency: Secondary | ICD-10-CM | POA: Diagnosis not present

## 2022-07-14 DIAGNOSIS — Z79899 Other long term (current) drug therapy: Secondary | ICD-10-CM | POA: Insufficient documentation

## 2022-07-14 DIAGNOSIS — Z87891 Personal history of nicotine dependence: Secondary | ICD-10-CM | POA: Diagnosis not present

## 2022-07-14 DIAGNOSIS — I2510 Atherosclerotic heart disease of native coronary artery without angina pectoris: Secondary | ICD-10-CM | POA: Insufficient documentation

## 2022-07-14 DIAGNOSIS — I493 Ventricular premature depolarization: Secondary | ICD-10-CM | POA: Diagnosis not present

## 2022-07-14 DIAGNOSIS — I252 Old myocardial infarction: Secondary | ICD-10-CM | POA: Diagnosis not present

## 2022-07-14 DIAGNOSIS — R001 Bradycardia, unspecified: Secondary | ICD-10-CM | POA: Diagnosis not present

## 2022-07-14 DIAGNOSIS — Z8249 Family history of ischemic heart disease and other diseases of the circulatory system: Secondary | ICD-10-CM | POA: Diagnosis not present

## 2022-07-14 DIAGNOSIS — G4733 Obstructive sleep apnea (adult) (pediatric): Secondary | ICD-10-CM | POA: Insufficient documentation

## 2022-07-14 DIAGNOSIS — Z7982 Long term (current) use of aspirin: Secondary | ICD-10-CM | POA: Diagnosis not present

## 2022-07-14 DIAGNOSIS — E785 Hyperlipidemia, unspecified: Secondary | ICD-10-CM | POA: Diagnosis not present

## 2022-07-14 DIAGNOSIS — Z955 Presence of coronary angioplasty implant and graft: Secondary | ICD-10-CM | POA: Insufficient documentation

## 2022-07-14 DIAGNOSIS — I5042 Chronic combined systolic (congestive) and diastolic (congestive) heart failure: Secondary | ICD-10-CM | POA: Insufficient documentation

## 2022-07-14 DIAGNOSIS — I110 Hypertensive heart disease with heart failure: Secondary | ICD-10-CM | POA: Diagnosis not present

## 2022-07-14 DIAGNOSIS — I255 Ischemic cardiomyopathy: Secondary | ICD-10-CM | POA: Diagnosis not present

## 2022-07-14 LAB — BASIC METABOLIC PANEL
Anion gap: 10 (ref 5–15)
BUN: 37 mg/dL — ABNORMAL HIGH (ref 8–23)
CO2: 30 mmol/L (ref 22–32)
Calcium: 8.9 mg/dL (ref 8.9–10.3)
Chloride: 100 mmol/L (ref 98–111)
Creatinine, Ser: 1.35 mg/dL — ABNORMAL HIGH (ref 0.61–1.24)
GFR, Estimated: 51 mL/min — ABNORMAL LOW (ref >60)
Glucose, Bld: 90 mg/dL (ref 70–99)
Potassium: 4.5 mmol/L (ref 3.5–5.1)
Sodium: 140 mmol/L (ref 135–145)

## 2022-07-14 LAB — MAGNESIUM: Magnesium: 1.8 mg/dL (ref 1.7–2.4)

## 2022-07-14 LAB — T4, FREE: Free T4: 0.85 ng/dL (ref 0.61–1.12)

## 2022-07-14 NOTE — Patient Instructions (Signed)
No changes to medications.  Thank you for allowing [place] to provider your heart failure care after your recent hospitalization. Please follow-up with Dr. [place] in 6 weeks.   If you have any questions, issues, or concerns before your next appointment please call our office at [phone], opt. 2 and leave a message for the triage nurse.   If you have any questions or concerns before your next appointment please send [place] a message through [place] or call our office at [phone].    TO LEAVE A MESSAGE FOR THE NURSE SELECT OPTION 2, PLEASE LEAVE A MESSAGE INCLUDING: YOUR NAME DATE OF BIRTH CALL BACK NUMBER REASON FOR CALL**this is important as we prioritize the call backs  YOU WILL RECEIVE A CALL BACK THE SAME DAY AS LONG AS YOU CALL BEFORE 4:00 PM  At the [facility], you and your health needs are our priority. As part of our continuing mission to provide you with exceptional heart care, we have created designated Provider Care Teams. These Care Teams include your primary Cardiologist (physician) and Advanced Practice Providers (APPs- Physician Assistants and Nurse Practitioners) who all work together to provide you with the care you need, when you need it.   You may see any of the following providers on your designated Care Team at your next follow up: Dr Arvilla Meres Dr Marca Ancona Dr. Marcos Eke, NP Robbie Lis, [place] [facility] [place], [place] Brynda Peon, NP Karle Plumber, PharmD   Please be sure to bring in all your medications bottles to every appointment.    Thank you for choosing [facility]

## 2022-07-14 NOTE — Progress Notes (Signed)
ReDS Vest / Clip - 07/14/22 0952       ReDS Vest / Clip   Station Marker C    Ruler Value 31    ReDS Value Range Low volume    ReDS Actual Value 25

## 2022-07-14 NOTE — Progress Notes (Signed)
HEART & VASCULAR TRANSITION OF CARE CONSULT NOTE     Referring Physician:Dr Ogbata Primary Care: Dr Durene Cal  Primary Cardiologist: Dr Swaziland Oncology: Dr Leonides Schanz  HPI: Referred to clinic by Dr Dartha Lodge for heart failure consultation.   [name] is a 88 year old with a history of prostate cancer, OSA, HLD, CAD, ICM, PVCs, HTN, and combined systolic and diastolic heart failure.   Admitted to [facility] 2017 with STEMI. Cath revealed a very large LCx with extensive thrombus. He underwent PCI with DES. With reperfusion he had NSVT. The pt also had residual RCA and LAD disease and an EF of 25% at cath. Echo done 11/05/15 showed an EF of 40-45%. On 11/06/15 he was taken back to the lab for staged PCI to his LAD and RCA. This was successful but on 11/07/15 the pt related that he developed some word finding issues that started post PCI 8/29. MRI revealed scattered small acute infarcts in the bilateral anterior and posterior circulation.  CA dopplers revealed mild plaque (1-39%).   Admitted January 2024 with SBO secondary to adhesions from previous abdominal surgeries. Concern for UTI/prostatitis. Given antibiotics but later stopped with negative culture.   Admitted with 05/31/22  A/C combined HF. Presented with increased dyspnea and leg edema. Diuresed with IV lasix and transitioned to lasix 20 mg po twice a day. Echo EF 40-45%. Carvedilol dose was reduced given bradycardia. Discharge weight 185 pounds. Referred to [facility] clinic.   At initial TOC visit, he was felt to be dry on exam. Wt had been steadily trending down since d/c. ReDs was 32%. BP was soft. Lasix was reduced down to 20 mg daily (advised to take extra 20 mg PRN).   He returns back today for f/u to reassess volume status. Here w/ wife. Reports doing well. Increase in exercise tolerance, though still limited some by chronic LBP. NYHA Class II. Denies resting dyspnea. No orthopnea, PND or LEE. Wt up 3 lb since diuretic dose reduction. ReDs 25%. BP  well controlled 128/74. EKG shows NSR 63 bpm w/ PVCs. He is asymptomatic w/ this and reports full compliance w/ CPAP. Denies CP. Eating low salt diet.     Cardiac Testing  Echo 05/2022   1. Inferior distal septal and apical hypokinesis . Left ventricular  ejection fraction, by estimation, is 40 to 45%. The left ventricle has  mildly decreased function. The left ventricle has no regional wall motion  abnormalities. The left ventricular  internal cavity size was mildly dilated. There is mild left ventricular  hypertrophy. Left ventricular diastolic parameters are indeterminate.   2. Right ventricular systolic function is normal. The right ventricular  size is normal. There is mildly elevated pulmonary artery systolic  pressure.   3. Left atrial size was moderately dilated.   4. The mitral valve is abnormal. Mild mitral valve regurgitation. No  evidence of mitral stenosis.   5. The aortic valve is tricuspid. There is moderate calcification of the  aortic valve. There is moderate thickening of the aortic valve. Aortic  valve regurgitation is not visualized. Mild to moderate   Review of Systems: [y] = yes, [ ]  = no   General: Weight gain [ ] ; Weight loss [ ] ; Anorexia [ ] ; Fatigue [ ] ; Fever [ ] ; Chills [ ] ; Weakness [ ]   Cardiac: Chest pain/pressure [ ] ; Resting SOB [ ] ; Exertional SOB [ ] ; Orthopnea [ ] ; Pedal Edema [ ] ; Palpitations [ ] ; Syncope [ ] ; Presyncope [ ] ; Paroxysmal nocturnal  dyspnea[ ]   Pulmonary: Cough [ ] ; Wheezing[ ] ; Hemoptysis[ ] ; Sputum [ ] ; Snoring [ ]   GI: Vomiting[ ] ; Dysphagia[ ] ; Melena[ ] ; Hematochezia [ ] ; Heartburn[ ] ; Abdominal pain [ ] ; Constipation [ ] ; Diarrhea [ ] ; BRBPR [ ]   GU: Hematuria[ ] ; Dysuria [ ] ; Nocturia[ ]   Vascular: Pain in legs with walking [ ] ; Pain in feet with lying flat [ ] ; Non-healing sores [ ] ; Stroke [ ] ; TIA [ ] ; Slurred speech [ ] ;  Neuro: Headaches[ ] ; Vertigo[ ] ; Seizures[ ] ; Paresthesias[ ] ;Blurred vision [ ] ; Diplopia [ ] ; Vision  changes [ ]   Ortho/Skin: Arthritis [ ] ; Joint pain [Y ]; Muscle pain [ ] ; Joint swelling [ ] ; Back Pain [ Y]; Rash [ ]   Psych: Depression[ ] ; Anxiety[ ]   Heme: Bleeding problems [ ] ; Clotting disorders [ ] ; Anemia [ ]   Endocrine: Diabetes [ ] ; Thyroid dysfunction[ ]    Past Medical History:  Diagnosis Date   Adenocarcinoma of prostate (HCC)    XRT & Radiation seed implantation in 2010   Adenomatous colon polyp    CAD (coronary artery disease)    a. 11/04/15:  Acute inferolateral STEMI: S/p emergent DES of a very large LCx with extensive thrombus. Significant residual disease in the proximal LAD and distal RCA. S/p staged PCI of LAD and RCA 8/29.   Cataract    CVA (cerebral infarction)    a. 10/2015: embolic CVA after heart cath    CVA (cerebral infarction)    a. 10/2015: embolic CVA after heart cath    DIVERTICULITIS, HX OF 11/27/2006        DJD (degenerative joint disease)    wrist - R    Hernia    History of blood transfusion 1985   with colon surgery   History of kidney stones    Hypertension    Ischemic cardiomyopathy    a. EF 25-30% by LV gram on cath 11/04/15. Appeared out of proportion to infarct although infarct was large. EF improved by Echo and now 40-45%.   Myocardial infarction (HCC) 2019   NEPHROLITHIASIS, HX OF 11/27/2006         Non Hodgkin's lymphoma (HCC) 11/2020   watching at this point, in abdomen, no meds or chemo yet as of 01-27-2022   Peripheral neuropathy    pt denies   Pneumonia    as a child   Psoriasis    Sleep apnea    wears cpap   Stroke (HCC)    no residuals   Tenosynovitis     Current Outpatient Medications  Medication Sig Dispense Refill   acetaminophen (TYLENOL) 650 MG CR tablet Take 650 mg by mouth every 4 (four) hours as needed for pain.     amLODipine (NORVASC) 5 MG tablet Take 1 tablet by mouth once daily 90 tablet 0   aspirin EC 81 MG tablet Take 1 tablet (81 mg total) by mouth daily. 90 tablet 1   atorvastatin (LIPITOR) 10 MG tablet  Take 1 tablet (10 mg total) by mouth every other day. 43 tablet 3   carvedilol (COREG) 3.125 MG tablet Take 1 tablet by mouth twice daily (Patient taking differently: Take 3.125 mg by mouth 2 (two) times daily.) 180 tablet 3   Cyanocobalamin (VITAMIN B-12) 5000 MCG TBDP Take 5,000 mcg by mouth every morning.     docusate sodium (COLACE) 100 MG capsule Take 100 mg by mouth 2 (two) times daily.     furosemide (LASIX) 20 MG tablet Take 1  tablet (20 mg total) by mouth daily. Take 1 additional tablet by mouth for a weight gain of 178 or greater 60 tablet 1   Multiple Vitamins-Minerals (OCUVITE ADULT 50+ PO) Take 1 tablet by mouth daily.     naphazoline-pheniramine (NAPHCON-A) 0.025-0.3 % ophthalmic solution Place 2 drops into both eyes 4 (four) times daily as needed for eye irritation or allergies. 15 mL 0   nitroGLYCERIN (NITROSTAT) 0.4 MG SL tablet Place 1 tablet (0.4 mg total) under the tongue every 5 (five) minutes x 3 doses as needed for chest pain. 25 tablet 2   phenol (CHLORASEPTIC) 1.4 % LIQD Use as directed 1 spray in the mouth or throat as needed for throat irritation / pain. 20 mL 0   polyethylene glycol (MIRALAX / GLYCOLAX) 17 g packet Take 17 g by mouth daily. (Patient taking differently: Take 17 g by mouth every other day.) 14 each 0   PREVIDENT 5000 DRY MOUTH 1.1 % GEL dental gel Place 1 Application onto teeth at bedtime.     spironolactone (ALDACTONE) 25 MG tablet Take 25 mg by mouth daily. Per cardiology     No current facility-administered medications for this encounter.    Allergies  Allergen Reactions   Bee Venom Swelling    Facial swelling   Betadine [Povidone Iodine] Other (See Comments)    blistering   Doxycycline     Possible drug rash- back of right lower leg and onto left lower leg as well      Social History   Socioeconomic History   Marital status: Married    Spouse name: Not on file   Number of children: 3   Years of education: Not on file   Highest education  level: Bachelor's degree (e.g., BA, AB, BS)  Occupational History   Not on file  Tobacco Use   Smoking status: Former    Packs/day: 2.00    Years: 30.00    Additional pack years: 0.00    Total pack years: 60.00    Types: Cigarettes    Quit date: 03/11/1983    Years since quitting: 39.3   Smokeless tobacco: Never   Tobacco comments:    started smoking in the service; ICU 3' part of his stomach; appendix; coma   Vaping Use   Vaping Use: Never used  Substance and Sexual Activity   Alcohol use: Not Currently    Comment: RARE, maybe one drink a couple times a year   Drug use: Never   Sexual activity: Not Currently  Other Topics Concern   Not on file  Social History Narrative   [employer], [place].Married 18 - Divorced 1996; remarried '03 different wife3 sons - '63, '65, '67Grandchildren -14; 1 great grand daughter; 4 great grands on wife's side, 1 great great granddaughter      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      Hobbies: fishing, busy volunteering   Social Determinants of Health   Financial Resource Strain: Low Risk  (05/20/2022)   Overall Financial Resource Strain (CARDIA)    Difficulty of Paying Living Expenses: Not hard at all  Food Insecurity: No Food Insecurity (07/09/2022)   Hunger Vital Sign    Worried About Running Out of Food in the Last Year: Never true    Ran Out of Food in the Last Year: Never true  Transportation Needs: No Transportation Needs (07/09/2022)   PRAPARE - Administrator, Civil Service (Medical): No  Lack of Transportation (Non-Medical): No  Physical Activity: Insufficiently Active (07/09/2022)   Exercise Vital Sign    Days of Exercise per Week: 2 days    Minutes of Exercise per Session: 10 min  Stress: No Stress Concern Present (07/09/2022)   [employer] of Occupational Health - Occupational Stress Questionnaire    Feeling of Stress : Only a little  Social Connections: Socially Integrated  (07/09/2022)   Social Connection and Isolation Panel [NHANES]    Frequency of Communication with Friends and Family: Twice a week    Frequency of Social Gatherings with Friends and Family: Once a week    Attends Religious Services: More than 4 times per year    Active Member of [employer] or Organizations: Yes    Attends Engineer, structural: More than 4 times per year    Marital Status: Married  Catering manager Violence: Not At Risk (05/31/2022)   Humiliation, Afraid, Rape, and Kick questionnaire    Fear of Current or Ex-Partner: No    Emotionally Abused: No    Physically Abused: No    Sexually Abused: No      Family History  Problem Relation Age of Onset   Coronary artery disease Mother    Alcohol abuse Father    Cirrhosis Father    Heart failure Sister    Breast cancer Sister        W/involvement of right arm leading to amputation   Anemia Sister        related to treatment to lymphoma   Non-Hodgkin's lymphoma Sister    Other Sister        died from covid 08-19-2020   Coronary artery disease Brother    Heart attack Brother    Non-Hodgkin's lymphoma Brother    Clotting disorder Brother    Non-Hodgkin's lymphoma Brother    Prostate cancer Neg Hx    Colon cancer Neg Hx    Diabetes Neg Hx    Glaucoma Neg Hx    Rectal cancer Neg Hx    Esophageal cancer Neg Hx    Colon polyps Neg Hx    Stomach cancer Neg Hx     Vitals:   07/14/22 0952  BP: 128/74  Pulse: (!) 54  SpO2: 96%  Weight: 84.6 kg (186 lb 9.6 oz)  Height: 6\' 1"  (1.854 m)    Wt Readings from Last 3 Encounters:  07/14/22 84.6 kg (186 lb 9.6 oz)  07/10/22 83.6 kg (184 lb 3.2 oz)  06/23/22 83.2 kg (183 lb 6.4 oz)     PHYSICAL EXAM: ReDs 25%  General:  Well appearing. No respiratory difficulty HEENT: normal Neck: supple. no JVD. Carotids 2+ bilat; no bruits. No lymphadenopathy or thyromegaly appreciated. Cor: PMI nondisplaced. Regular rate & rhythm. No rubs, gallops or murmurs. Lungs: clear Abdomen:  soft, nontender, nondistended. No hepatosplenomegaly. No bruits or masses. Good bowel sounds. Extremities: no cyanosis, clubbing, rash, edema Neuro: alert & oriented x 3, cranial nerves grossly intact. moves all 4 extremities w/o difficulty. Affect pleasant.   ECG: NSR 63 bpm w/ PVCs    ASSESSMENT & PLAN: 1. Chronic Combined Systolic and Diastolic Heart Failure  - Echo 05/2022 EF 40-45-% RV normal consistent with previous studies. Denies CP. May be PVC mediated.  - NYHA Class II. Euvolemic on exam  - Continue Spiro 25 mg daily  - Continue Coreg 3.125 mg bid. Titration limited by h/o bradycardia - Continue Lasix 20 mg daily  - Not on ARNi given h/o soft BPs  at home  - Not on SGLT2i given recent UTI  - Discussed low sodium diet and fluid restriction    2. OSA - reports compliance w/ CPAP, continue nightly use   3. Bradycardia - NSR 63 bpm w/ PVCs - Continue low dose Coreg   4. PVCs  - continue Coreg - continue nightly CPAP  - Check BMP, Mg and TFTs today  - cardiology to follow. If he has further drop in EF, would recommend Zio to quantify burden. May need suppression w/ an antiarrhythmic   5. CAD - s/p STEMI in 2017, 2/2 thrombotic occlusion of the LCxm treated w/ PCI followed by staged PCI of the proximal LAD and RCA.  - stable w/o CP - continue medical therapy  - ASA 81 mg daily - Atorvastatin 10 mg daily  - Coreg 3.125 mg bid    Referred to HFSW (PCP, Medications, Transportation, ETOH Abuse, Drug Abuse, Insurance, Financial ): No  Refer to Pharmacy: No Refer to Home Health:  No Refer to Advanced Heart Failure Clinic: No Refer to General Cardiology: Existing patient of Dr Swaziland   Return back to Cardiology. F/u w/ Dr. Swaziland in 6 wks.    Randy Castrejon PA-C 10:03 AM

## 2022-07-15 LAB — T3, FREE: T3, Free: 2.4 pg/mL (ref 2.0–4.4)

## 2022-07-21 ENCOUNTER — Other Ambulatory Visit: Payer: Medicare Other

## 2022-07-21 ENCOUNTER — Ambulatory Visit: Payer: Medicare Other | Admitting: Hematology and Oncology

## 2022-07-28 ENCOUNTER — Telehealth: Payer: Self-pay | Admitting: Pharmacist

## 2022-07-28 NOTE — Progress Notes (Signed)
Care Management & Coordination Services Pharmacy Team  Reason for Encounter: Appointment Reminder  Contacted patient to confirm telephone appointment with Erskine Emery, PharmD on 07/29/2022 at 9:30 am. Spoke with patient on 07/28/2022    Star Rating Drugs:  Atorvastatin 10 mg last filled 07/13/2022 86 DS Losartan 100 mg last filled 04/19/2022 90 DS   Care Gaps: Annual wellness visit in last year? Yes   April D Calhoun, [facility] Clinical Pharmacist Assistant [phone]

## 2022-07-29 ENCOUNTER — Other Ambulatory Visit: Payer: Self-pay | Admitting: Cardiology

## 2022-07-29 ENCOUNTER — Ambulatory Visit: Payer: Self-pay

## 2022-07-29 ENCOUNTER — Ambulatory Visit: Payer: Medicare Other | Admitting: Pharmacist

## 2022-07-29 NOTE — Patient Instructions (Addendum)
Visit Information  Thank you for taking time to visit with me today. Please don't hesitate to contact me if I can be of assistance to you.   Following are the goals we discussed today:   Goals Addressed             This Visit's Progress    Heart Failure Management       Patient Goals/Self Care Activities: -Patient/Caregiver will self-administer medications as prescribed as evidenced by self-report/primary caregiver report  -Patient/Caregiver will attend all scheduled provider appointments as evidenced by clinician review of documented attendance to scheduled appointments and patient/caregiver report -Patient/Caregiver will call provider office for new concerns or questions as evidenced by review of documented incoming telephone call notes and patient report  -Weigh daily and record (notify MD with 3 lb weight gain over night or 5 lb in a week) -Follow CHF Action Plan -Adhere to low sodium diet Current weight 174.6 lbs                 Our next appointment is by telephone on 09/23/22 at 1030 am  Please call the care guide team at [phone] if you need to cancel or reschedule your appointment.   If you are experiencing a Mental Health or Behavioral Health Crisis or need someone to talk to, please call the Suicide and Crisis Lifeline: 988   Patient verbalizes understanding of instructions and care plan provided today and agrees to view in MyChart. Active MyChart status and patient understanding of how to access instructions and care plan via MyChart confirmed with patient.     The patient has been provided with contact information for the care management team and has been advised to call with any health related questions or concerns.   Bary Leriche, RN, MSN [facility] Care Management Care Management Coordinator Direct Line [phone]

## 2022-07-29 NOTE — Progress Notes (Signed)
Care Management & Coordination Services Pharmacy Note  07/29/2022 Name:  Jose Ware MRN:  [id number] DOB:  24-Apr-1934  Summary: PharmD Fu visit for HF.  On daily Lasix since hospital stay and reports no extra fluid at this time.  Monitoring weights daily.  Denies shortness of breath.  Does report continued weakness in legs since hospital stay.  Discussed PT and he declines at this time but is considering.  Watch for shortness of breath and swelling.  Recommendations/Changes made from today's visit: No changes, close monitoring  Follow up plan: FU with me in 6 months CMA to assess fluids in 30 days   Subjective: Jose Ware is an 88 y.o. year old male who is a primary patient of Durene Cal, Aldine Contes, MD.  The care coordination team was consulted for assistance with disease management and care coordination needs.    Engaged with patient by telephone for follow up visit.  Recent office visits:  04/04/2022 [facility] (PCP) Shelva Majestic, MD; no medication changes indicated.   Recent consult visits:  04/14/2022 OV (Cardiology) [place], Peter M, MD; no medication changes indicated.   Hospital visits:  05/31/2022 ED to Hospital Admission due to Acute on chronic combined systolic and diastolic congestive heart failure   03/20/2022 ED to Hospital Admission due to SBO -discontinue Losartan   Objective:  Lab Results  Component Value Date   CREATININE 1.35 (H) 07/14/2022   BUN 37 (H) 07/14/2022   GFR 70.14 04/04/2022   GFRNONAA 51 (L) 07/14/2022   GFRAA 72 12/23/2019   NA 140 07/14/2022   K 4.5 07/14/2022   CALCIUM 8.9 07/14/2022   CO2 30 07/14/2022   GLUCOSE 90 07/14/2022    Lab Results  Component Value Date/Time   HGBA1C 5.3 11/04/2015 05:45 PM   GFR 70.14 04/04/2022 10:52 AM   GFR 67.76 01/09/2022 10:46 AM    Last diabetic Eye exam: No results found for: "HMDIABEYEEXA"  Last diabetic Foot exam: No results found for: "HMDIABFOOTEX"   Lab Results  Component Value  Date   CHOL 68 03/28/2022   HDL 20 (L) 03/28/2022   LDLCALC 37 03/28/2022   LDLDIRECT 33.0 06/25/2020   TRIG 56 03/28/2022   CHOLHDL 3.4 03/28/2022       Latest Ref Rng & Units 06/01/2022   12:45 AM 05/31/2022    9:50 AM 04/04/2022   10:52 AM  Hepatic Function  Total Protein 6.5 - 8.1 g/dL 6.2  6.4  5.9   Albumin 3.5 - 5.0 g/dL 3.3  3.6  3.4   AST 15 - 41 U/L 12  13  14    ALT 0 - 44 U/L 14  15  14    Alk Phosphatase 38 - 126 U/L 59  68  58   Total Bilirubin 0.3 - 1.2 mg/dL 2.1  1.8  1.1   Bilirubin, Direct 0.0 - 0.2 mg/dL 0.2       Lab Results  Component Value Date/Time   TSH 3.09 06/20/2019 12:05 PM   TSH 2.17 12/17/2018 10:27 AM   FREET4 0.85 07/14/2022 10:27 AM       Latest Ref Rng & Units 06/02/2022    3:57 AM 06/01/2022   12:45 AM 05/31/2022    9:50 AM  CBC  WBC 4.0 - 10.5 K/uL 8.4  7.7  7.1   Hemoglobin 13.0 - 17.0 g/dL 40.9  81.1  91.4   Hematocrit 39.0 - 52.0 % 36.3  36.7  36.9   Platelets 150 - 400 K/uL 177  193  206     Lab Results  Component Value Date/Time   VITAMINB12 >1500 (H) 01/09/2022 10:46 AM   VITAMINB12 >1550 (H) 12/27/2020 11:31 AM    Clinical ASCVD: Yes  The ASCVD Risk score (Arnett DK, et al., 2019) failed to calculate for the following reasons:   The 2019 ASCVD risk score is only valid for ages 86 to 14   The patient has a prior MI or stroke diagnosis        06/11/2022   10:46 AM 05/20/2022   10:39 AM 04/04/2022    9:08 AM  Depression screen PHQ 2/9  Decreased Interest 0 0 1  Down, Depressed, Hopeless 0 1 1  PHQ - 2 Score 0 1 2  Altered sleeping   3  Tired, decreased energy   0  Change in appetite   0  Feeling bad or failure about yourself    0  Trouble concentrating   0  Moving slowly or fidgety/restless   0  Suicidal thoughts   0  PHQ-9 Score   5  Difficult doing work/chores  Not difficult at all Not difficult at all     Social History   Tobacco Use  Smoking Status Former   Packs/day: 2.00   Years: 30.00   Additional pack  years: 0.00   Total pack years: 60.00   Types: Cigarettes   Quit date: 03/11/1983   Years since quitting: 39.4  Smokeless Tobacco Never  Tobacco Comments   started smoking in the service; ICU 85' part of his stomach; appendix; coma    BP Readings from Last 3 Encounters:  07/14/22 128/74  07/10/22 112/64  06/23/22 110/70   Pulse Readings from Last 3 Encounters:  07/14/22 (!) 54  07/10/22 (!) 47  06/23/22 (!) 48   Wt Readings from Last 3 Encounters:  07/14/22 186 lb 9.6 oz (84.6 kg)  07/10/22 184 lb 3.2 oz (83.6 kg)  06/23/22 183 lb 6.4 oz (83.2 kg)   BMI Readings from Last 3 Encounters:  07/14/22 24.62 kg/m  07/10/22 24.30 kg/m  06/23/22 24.20 kg/m    Allergies  Allergen Reactions   Bee Venom Swelling    Facial swelling   Betadine [Povidone Iodine] Other (See Comments)    blistering   Doxycycline     Possible drug rash- back of right lower leg and onto left lower leg as well    Medications Reviewed Today     Reviewed by Erroll Luna, [facility] (Pharmacist) on 07/29/22 at 1052  Med List Status: <None>   Medication Order Taking? Sig Documenting Provider Last Dose Status Informant  acetaminophen (TYLENOL) 650 MG CR tablet [id number] No Take 650 mg by mouth every 4 (four) hours as needed for pain. [provider] Taking Active Self, Spouse/Significant Other, Pharmacy Records  amLODipine (NORVASC) 5 MG tablet [id number] No Take 1 tablet by mouth once daily Shelva Majestic, MD Taking Active   aspirin EC 81 MG tablet [id number] No Take 1 tablet (81 mg total) by mouth daily. [place], Peter M, MD Taking Active Self, Pharmacy Records, Spouse/Significant Other  atorvastatin (LIPITOR) 10 MG tablet [id number] No Take 1 tablet (10 mg total) by mouth every other day. Shelva Majestic, MD Taking Active Self, Pharmacy Records, Spouse/Significant Other  carvedilol (COREG) 3.125 MG tablet [id number] No Take 1 tablet by mouth twice daily  Patient taking differently: Take 3.125 mg  by mouth 2 (two) times daily.   [place], Peter M, MD Taking Active Self, Pharmacy Records,  Spouse/Significant Other  Cyanocobalamin (VITAMIN B-12) 5000 MCG TBDP [id number] No Take 5,000 mcg by mouth every morning. [provider] Taking Active Self, Pharmacy Records, Spouse/Significant Other  docusate sodium (COLACE) 100 MG capsule [id number] No Take 100 mg by mouth 2 (two) times daily. [provider] Taking Active Self, Spouse/Significant Other, Pharmacy Records  furosemide (LASIX) 20 MG tablet [id number] No Take 1 tablet (20 mg total) by mouth daily. Take 1 additional tablet by mouth for a weight gain of 178 or greater Clegg, Amy D, NP Taking Active   Multiple Vitamins-Minerals (OCUVITE ADULT 50+ PO) [id number] No Take 1 tablet by mouth daily. [provider] Taking Active Self, Pharmacy Records, Spouse/Significant Other  naphazoline-pheniramine (NAPHCON-A) 0.025-0.3 % ophthalmic solution [id number] No Place 2 drops into both eyes 4 (four) times daily as needed for eye irritation or allergies. Drema Dallas, MD Taking Active Self, Spouse/Significant Other, Pharmacy Records  nitroGLYCERIN (NITROSTAT) 0.4 MG SL tablet [id number] No Place 1 tablet (0.4 mg total) under the tongue every 5 (five) minutes x 3 doses as needed for chest pain. Shelva Majestic, MD Taking Active Self, Pharmacy Records, Spouse/Significant Other  phenol [facility]) 1.4 % LIQD [id number] No Use as directed 1 spray in the mouth or throat as needed for throat irritation / pain. Drema Dallas, MD Taking Active Self, Spouse/Significant Other, Pharmacy Records  polyethylene glycol (MIRALAX / GLYCOLAX) 17 g packet [id number] No Take 17 g by mouth daily.  Patient taking differently: Take 17 g by mouth every other day.   Drema Dallas, MD Taking Active Self, Spouse/Significant Other, Pharmacy Records  PREVIDENT 5000 DRY MOUTH 1.1 % GEL dental gel [id number] No Place 1 Application onto teeth at bedtime. [provider] Taking Active Self, Pharmacy Records, Spouse/Significant Other  spironolactone (ALDACTONE) 25 MG tablet [id number] No Take 25 mg by mouth daily. Per cardiology [provider] Taking Active Self, Spouse/Significant Other, Pharmacy Records            SDOH:  (Social Determinants of Health) assessments and interventions performed: No, done within year Financial Resource Strain: Low Risk  (05/20/2022)   Overall Financial Resource Strain (CARDIA)    Difficulty of Paying Living Expenses: Not hard at all   Food Insecurity: No Food Insecurity (07/09/2022)   Hunger Vital Sign    Worried About Running Out of Food in the Last Year: Never true    Ran Out of Food in the Last Year: Never true     SDOH Interventions    Flowsheet Row Care Coordination from 06/11/2022 in [facility] Care Coordination Clinical Support from 05/20/2022 in [facility] [employer] from 04/04/2022 in [facility] [employer] from 03/31/2022 in [facility] [facility] Care Coordination  SDOH Interventions      Food Insecurity Interventions -- Intervention Not Indicated -- Intervention Not Indicated  Housing Interventions Intervention Not Indicated Intervention Not Indicated -- --  Transportation Interventions Intervention Not Indicated Intervention Not Indicated -- Intervention Not Indicated  Utilities Interventions -- Intervention Not Indicated -- --  Depression Interventions/Treatment  -- -- PHQ2-9 Score <4 Follow-up Not Indicated --  Financial Strain Interventions -- Intervention Not Indicated -- --  Physical Activity Interventions -- Intervention Not Indicated -- --  Stress Interventions -- Intervention Not Indicated -- --  Social Connections Interventions -- Intervention Not Indicated -- --       Medication Assistance: None required.  Patient affirms current coverage meets needs.  Medication Access: Name and location  of current pharmacy:  [facility] [address], [place] - [address] [place] [address] Phone: [phone] Fax: [phone]  Within the past 30 days, how often has patient missed a dose of medication? 0 Is a pillbox or other method used to improve adherence? Yes  Factors that may affect medication adherence? no barriers identified Are meds synced by current pharmacy? No  Are meds delivered by current pharmacy? No  Does patient experience delays in picking up medications due to transportation concerns? No   Compliance/Adherence/Medication fill history: Star Rating Drugs:  Atorvastatin 10 mg last filled 07/13/2022 88 DS Losartan 100 mg last filled 04/19/2022 89 DS     Care Gaps: Annual wellness visit in last year? Yes   Assessment/Plan     Hypertension (BP goal <130/80) -Controlled, not assessed -Current treatment: Amlodipine 5mg  PM Appropriate, Effective, Safe, Accessible Carvedilol 3.125mg  BID Appropriate, Effective, Safe, Accessible Losartan 100mg  AM Appropriate, Effective, Safe, Accessible Spironolactone 25mg  AM Appropriate, Effective, Safe, Accessible -Medications previously tried: none noted  -Current home readings: 120/60s -Current dietary habits: wife keeps him on a good diet -Current exercise habits: gym 2-3 times per week and walks around his house the other days.  Combination of weight bearing and cardiovascular exercise -Denies hypotensive/hypertensive symptoms -Educated on BP goals and benefits of medications for prevention of heart attack, stroke and kidney damage; Daily salt intake goal < 2300 mg; Exercise goal of 150 minutes per week; -Counseled to monitor BP at home as current, document, and provide log at future appointments -Recommended to continue current medication  Heart Failure (Goal: manage symptoms and prevent exacerbations) 07/29/22 -Controlled -Last ejection fraction: 40-45%  -HF type:  HFrEF (EF < 40%) -Current treatment: Furosemide 20mg  Appropriate, Effective, Safe, Accessible Spironolactone 25mg  Appropriate, Effective, Safe, Accessible Carvedilol 3.125mg  BID Appropriate, Effective, Safe, Accessible -Medications previously tried: none noted  -Current home BP/HR readings: 110s/56-60s -Current home daily weights: yes monitors daily, has plan in place for extra lasix if weight above 178 -Current dietary habits: limiting salt intake -Current exercise habits: walks short distances daily -Educated on Benefits of medications for managing symptoms and prolonging life Importance of weighing daily; if you gain more than 3 pounds in one day or 5 pounds in one week, contact providers -Recommended to continue current medication -No recent shortness of breath or weight gain.  Counseled on proper plan for titration if weight increases.  Reports still feeling weak/shaky since hospital stay.  Discussed potential for PT and he will let me know if he wants thins.  Counseled on monitoring and early detection of increased fluids - importance to call early with signs.  Patient agrees to plan.   CMA to FU in 30 days to assess fluids and weakness.   Hyperlipidemia/CAD: (LDL goal < 70) -Controlled, not assessed -Current treatment: Atorvastatin 10mg  Appropriate, Effective, Safe, Accessible -Medications previously tried: higher doses of lipiutor   -Educated on Cholesterol goals;  Benefits of statin for ASCVD risk reduction; Importance of limiting foods high in cholesterol; -Recommended to continue current medication LDL is excellent          Willa Frater, PharmD Clinical Pharmacist  [facility] [phone]

## 2022-07-29 NOTE — Patient Outreach (Signed)
  Care Coordination   Follow Up Visit Note   07/29/2022 Name: MARTIE CAVAZOS MRN: [id number] DOB: [birth year]  [name] is a 88 y.o. year old male who sees Durene Cal, Aldine Contes, MD for primary care. I spoke with  [name] by phone today.  What matters to the patients health and wellness today?  Health management    Goals Addressed             This Visit's Progress    Heart Failure Management       Patient Goals/Self Care Activities: -Patient/Caregiver will self-administer medications as prescribed as evidenced by self-report/primary caregiver report  -Patient/Caregiver will attend all scheduled provider appointments as evidenced by clinician review of documented attendance to scheduled appointments and patient/caregiver report -Patient/Caregiver will call provider office for new concerns or questions as evidenced by review of documented incoming telephone call notes and patient report  -Weigh daily and record (notify MD with 3 lb weight gain over night or 5 lb in a week) -Follow CHF Action Plan -Adhere to low sodium diet Current weight 174.6 lbs                 SDOH assessments and interventions completed:  Yes     Care Coordination Interventions:  Yes, provided   Follow up plan: Follow up call scheduled for July    Encounter Outcome:  Pt. Visit Completed   Bary Leriche, RN, MSN [facility] Care Management Care Management Coordinator Direct Line [phone]

## 2022-07-30 NOTE — Telephone Encounter (Signed)
Medication was discontinued by provider refill inappropriate- per note attached to refill

## 2022-08-06 ENCOUNTER — Inpatient Hospital Stay: Payer: Medicare Other | Attending: Hematology and Oncology

## 2022-08-06 ENCOUNTER — Inpatient Hospital Stay (HOSPITAL_BASED_OUTPATIENT_CLINIC_OR_DEPARTMENT_OTHER): Payer: Medicare Other | Admitting: Hematology and Oncology

## 2022-08-06 ENCOUNTER — Other Ambulatory Visit: Payer: Self-pay | Admitting: Hematology and Oncology

## 2022-08-06 VITALS — BP 130/50 | HR 55 | Temp 97.7°F | Resp 17 | Wt 186.9 lb

## 2022-08-06 DIAGNOSIS — I700 Atherosclerosis of aorta: Secondary | ICD-10-CM | POA: Diagnosis not present

## 2022-08-06 DIAGNOSIS — Z807 Family history of other malignant neoplasms of lymphoid, hematopoietic and related tissues: Secondary | ICD-10-CM | POA: Diagnosis not present

## 2022-08-06 DIAGNOSIS — C8293 Follicular lymphoma, unspecified, intra-abdominal lymph nodes: Secondary | ICD-10-CM

## 2022-08-06 DIAGNOSIS — Z803 Family history of malignant neoplasm of breast: Secondary | ICD-10-CM | POA: Insufficient documentation

## 2022-08-06 DIAGNOSIS — Z87891 Personal history of nicotine dependence: Secondary | ICD-10-CM | POA: Insufficient documentation

## 2022-08-06 DIAGNOSIS — C8213 Follicular lymphoma grade II, intra-abdominal lymph nodes: Secondary | ICD-10-CM | POA: Insufficient documentation

## 2022-08-06 DIAGNOSIS — R590 Localized enlarged lymph nodes: Secondary | ICD-10-CM

## 2022-08-06 LAB — CMP (CANCER CENTER ONLY)
ALT: 10 U/L (ref 0–44)
AST: 13 U/L — ABNORMAL LOW (ref 15–41)
Albumin: 4 g/dL (ref 3.5–5.0)
Alkaline Phosphatase: 72 U/L (ref 38–126)
Anion gap: 6 (ref 5–15)
BUN: 39 mg/dL — ABNORMAL HIGH (ref 8–23)
CO2: 33 mmol/L — ABNORMAL HIGH (ref 22–32)
Calcium: 9.1 mg/dL (ref 8.9–10.3)
Chloride: 102 mmol/L (ref 98–111)
Creatinine: 1.35 mg/dL — ABNORMAL HIGH (ref 0.61–1.24)
GFR, Estimated: 51 mL/min — ABNORMAL LOW (ref >60)
Glucose, Bld: 91 mg/dL (ref 70–99)
Potassium: 4.3 mmol/L (ref 3.5–5.1)
Sodium: 141 mmol/L (ref 135–145)
Total Bilirubin: 0.9 mg/dL (ref 0.3–1.2)
Total Protein: 7 g/dL (ref 6.5–8.1)

## 2022-08-06 LAB — CBC WITH DIFFERENTIAL (CANCER CENTER ONLY)
Abs Immature Granulocytes: 0.02 10*3/uL (ref 0.00–0.07)
Basophils Absolute: 0.1 10*3/uL (ref 0.0–0.1)
Basophils Relative: 1 %
Eosinophils Absolute: 0.1 10*3/uL (ref 0.0–0.5)
Eosinophils Relative: 1 %
HCT: 37 % — ABNORMAL LOW (ref 39.0–52.0)
Hemoglobin: 12.5 g/dL — ABNORMAL LOW (ref 13.0–17.0)
Immature Granulocytes: 0 %
Lymphocytes Relative: 16 %
Lymphs Abs: 1.2 10*3/uL (ref 0.7–4.0)
MCH: 33.1 pg (ref 26.0–34.0)
MCHC: 33.8 g/dL (ref 30.0–36.0)
MCV: 97.9 fL (ref 80.0–100.0)
Monocytes Absolute: 0.9 10*3/uL (ref 0.1–1.0)
Monocytes Relative: 12 %
Neutro Abs: 5.4 10*3/uL (ref 1.7–7.7)
Neutrophils Relative %: 70 %
Platelet Count: 207 10*3/uL (ref 150–400)
RBC: 3.78 MIL/uL — ABNORMAL LOW (ref 4.22–5.81)
RDW: 14.5 % (ref 11.5–15.5)
WBC Count: 7.7 10*3/uL (ref 4.0–10.5)
nRBC: 0 % (ref 0.0–0.2)

## 2022-08-06 LAB — LACTATE DEHYDROGENASE: LDH: 97 U/L — ABNORMAL LOW (ref 98–192)

## 2022-08-06 NOTE — Progress Notes (Signed)
[facility] [phone]   [phone]  PROGRESS NOTE  Patient Care Team: Shelva Majestic, MD as PCP - General (Family Medicine) [place], Peter M, MD as PCP - Cardiology (Cardiology) Su Grand, MD (Inactive) (Urology) Meryl Dare, MD (Gastroenterology) [place], Peter M, MD as Consulting Physician (Cardiology) Marzella Schlein., MD as Consulting Physician (Ophthalmology) Jerilee Field, MD as Consulting Physician (Urology) Erroll Luna, [facility] as Pharmacist (Pharmacist) Fleeta Emmer, RN as [facility] Care Management  Hematological/Oncological History # Follicular Lymphoma Stage I-II 12/04/2020: CT C/A/P shows periaortic adenopathy in the upper abdomen. No evidence of disease in the chest 12/17/2020: biopsy of lymph node shows result consistent with follicular lymphoma. 01/09/2021: transition care to Dr. Leonides Schanz  07/17/2021: CT C/A/P showed slight progression of disease as demonstrated by slight interval enlargement of several retroperitoneal lymph nodes and a mildly enlarged low middle mediastinal lymph node.  Interval History:  Jose Ware 88 y.o. male with medical history significant for newly diagnosed early stage follicular lymphoma who presents for a follow up visit. The patient's last visit was on 01/20/2022 . In the interim since the last visit he has had no changes in his health.   On exam today Mr. Proia is accompanied by his wife.  He reports he has had 2 hospitalizations in the interim since her last visit.  In January he had a colonic obstruction and in March she had congestive heart failure exacerbation.  He notes that unfortunately this has put him in a position where he has to conflicting diets.  He has been told not to eat roughage due to the blockage, but is trying to eat a heart healthy diet as well.  He reports he is eating salad once a week as well as sweet potatoes.  He notes that fortunately the CT scan  did not show any change in his lymphadenopathy and his follicular lymphoma is stable.  He notes he has not noticed any new lumps or bumps.  His energy does however remain low.  He reports he has a good appetite and "eats everything on his plate".  He denies feeling any lymphadenopathy in his neck, underarms, or groin. He notes that he otherwise is not having any fevers, chills, sweats, nausea, vomiting or diarrhea.  Full 10 point ROS is listed below.  MEDICAL HISTORY:  Past Medical History:  Diagnosis Date   Adenocarcinoma of prostate [facility])    XRT & Radiation seed implantation in 2010   Adenomatous colon polyp    CAD (coronary artery disease)    a. 11/04/15:  Acute inferolateral STEMI: S/p emergent DES of a very large LCx with extensive thrombus. Significant residual disease in the proximal LAD and distal RCA. S/p staged PCI of LAD and RCA 8/29.   Cataract    CVA (cerebral infarction)    a. 10/2015: embolic CVA after heart cath    CVA (cerebral infarction)    a. 10/2015: embolic CVA after heart cath    DIVERTICULITIS, HX OF 11/27/2006        DJD (degenerative joint disease)    wrist - R    Hernia    History of blood transfusion 1985   with colon surgery   History of kidney stones    Hypertension    Ischemic cardiomyopathy    a. EF 25-30% by LV gram on cath 11/04/15. Appeared out of proportion to infarct although infarct was large. EF improved by Echo and now 40-45%.   Myocardial infarction (  HCC) 2019   NEPHROLITHIASIS, HX OF 11/27/2006         Non Hodgkin's lymphoma (HCC) 11/2020   watching at this point, in abdomen, no meds or chemo yet as of 01-27-2022   Peripheral neuropathy    pt denies   Pneumonia    as a child   Psoriasis    Sleep apnea    wears cpap   Stroke [facility])    no residuals   Tenosynovitis     SURGICAL HISTORY: Past Surgical History:  Procedure Laterality Date   APPENDECTOMY  03/11/1983   BACK SURGERY  03/10/1978   minor disk surgery: instrumentation placed  and removed in a second procedure   CARDIAC CATHETERIZATION N/A 11/04/2015   Procedure: Left Heart Cath and Coronary Angiography;  Surgeon: Peter M Swaziland, MD;  Location: [facility] INVASIVE CV LAB;  Service: Cardiovascular;  Laterality: N/A;   CARDIAC CATHETERIZATION N/A 11/04/2015   Procedure: Coronary Stent Intervention;  Surgeon: Peter M [place]azil[place], MD;  Location: [facility] INVASIVE CV LAB;  Service: Cardiovascular;  Laterality: N/A;   CARDIAC CATHETERIZATION N/A 11/06/2015   Procedure: Coronary Stent Intervention;  Surgeon: Marykay Lex, MD;  Location: [facility] INVASIVE CV LAB;  Service: Cardiovascular;  Laterality: N/A;   CATARACT EXTRACTION, BILATERAL  03/11/2011   CHOLECYSTECTOMY  03/10/1984   COLON SURGERY  11/09/1983   pt. had ileus, had colon surgery, requiring colostomy & then reversal & then dehisence of that wound & return to OR for repair & cholecystectomy     COLONOSCOPY  2019   COLONOSCOPY WITH PROPOFOL N/A 03/13/2022   Procedure: COLONOSCOPY WITH PROPOFOL;  Surgeon: Meryl Dare, MD;  Location: [facility];  Service: Gastroenterology;  Laterality: N/A;   COLOSTOMY  03/11/1983   After colectomy for diverticulitis, pt. remarks during this surgery they "gave me the paddles two times  because of bleeding", pt. states it was unrelated to any anesthesia complication   EYE SURGERY     /w IOL   KNEE ARTHROSCOPY Left 03/10/2001   KNEE SURGERY Left 03/10/1954   Left 1956, Right w/cartilage removed later   PARATHYROIDECTOMY N/A 05/12/2014   Procedure: PARATHYROIDECTOMY;  Surgeon: Darnell Level, MD;  Location: [facility] OR;  Service: General;  Laterality: N/A;   POLYPECTOMY     POLYPECTOMY  03/13/2022   Procedure: POLYPECTOMY;  Surgeon: Meryl Dare, MD;  Location: [facility];  Service: Gastroenterology;;   REVERSAL OF COLOSTOMY  03/10/1985   TONSILLECTOMY  03/10/1944   WRIST SURGERY Left    Remote complicated w/infection    SOCIAL HISTORY: Social History   Socioeconomic History   Marital status:  Married    Spouse name: Not on file   Number of children: 3   Years of education: Not on file   Highest education level: Bachelor's degree (e.g., BA, AB, BS)  Occupational History   Not on file  Tobacco Use   Smoking status: Former    Packs/day: 2.00    Years: 30.00    Additional pack years: 0.00    Total pack years: 60.00    Types: Cigarettes    Quit date: 03/11/1983    Years since quitting: 39.4   Smokeless tobacco: Never   Tobacco comments:    started smoking in the service; ICU 85' part of his stomach; appendix; coma   Vaping Use   Vaping Use: Never used  Substance and Sexual Activity   Alcohol use: Not Currently    Comment: RARE, maybe one drink a couple times a year  Drug use: Never   Sexual activity: Not Currently  Other Topics Concern   Not on file  Social History Narrative   [facility] [place], [place].Married 63 - Divorced 08/21/1994; remarried '03 different wife3 sons - '63, '65, '67Grandchildren -14; 1 great grand daughter; 4 great grands on wife's side, 1 great great granddaughter      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      Hobbies: fishing, busy volunteering   Social Determinants of Health   Financial Resource Strain: Low Risk  (05/20/2022)   Overall Financial Resource Strain (CARDIA)    Difficulty of Paying Living Expenses: Not hard at all  Food Insecurity: No Food Insecurity (07/09/2022)   Hunger Vital Sign    Worried About Running Out of Food in the Last Year: Never true    Ran Out of Food in the Last Year: Never true  Transportation Needs: No Transportation Needs (07/09/2022)   PRAPARE - Administrator, Civil Service (Medical): No    Lack of Transportation (Non-Medical): No  Physical Activity: Insufficiently Active (07/09/2022)   Exercise Vital Sign    Days of Exercise per Week: 2 days    Minutes of Exercise per Session: 10 min  Stress: No Stress Concern Present (07/09/2022)   [employer] of Occupational Health  - Occupational Stress Questionnaire    Feeling of Stress : Only a little  Social Connections: Socially Integrated (07/09/2022)   Social Connection and Isolation Panel [NHANES]    Frequency of Communication with Friends and Family: Twice a week    Frequency of Social Gatherings with Friends and Family: Once a week    Attends Religious Services: More than 4 times per year    Active Member of [employer] or Organizations: Yes    Attends Engineer, structural: More than 4 times per year    Marital Status: Married  Catering manager Violence: Not At Risk (05/31/2022)   Humiliation, Afraid, Rape, and Kick questionnaire    Fear of Current or Ex-Partner: No    Emotionally Abused: No    Physically Abused: No    Sexually Abused: No    FAMILY HISTORY: Family History  Problem Relation Age of Onset   Coronary artery disease Mother    Alcohol abuse Father    Cirrhosis Father    Heart failure Sister    Breast cancer Sister        W/involvement of right arm leading to amputation   Anemia Sister        related to treatment to lymphoma   Non-Hodgkin's lymphoma Sister    Other Sister        died from covid 08-20-2020   Coronary artery disease Brother    Heart attack Brother    Non-Hodgkin's lymphoma Brother    Clotting disorder Brother    Non-Hodgkin's lymphoma Brother    Prostate cancer Neg Hx    Colon cancer Neg Hx    Diabetes Neg Hx    Glaucoma Neg Hx    Rectal cancer Neg Hx    Esophageal cancer Neg Hx    Colon polyps Neg Hx    Stomach cancer Neg Hx     ALLERGIES:  is allergic to bee venom, betadine [povidone iodine], and doxycycline.  MEDICATIONS:  Current Outpatient Medications  Medication Sig Dispense Refill   acetaminophen (TYLENOL) 650 MG CR tablet Take 650 mg by mouth every 4 (four) hours as needed for pain.     amLODipine (NORVASC)  5 MG tablet Take 1 tablet by mouth once daily 90 tablet 0   aspirin EC 81 MG tablet Take 1 tablet (81 mg total) by mouth daily. 90 tablet 1    atorvastatin (LIPITOR) 10 MG tablet Take 1 tablet (10 mg total) by mouth every other day. 43 tablet 3   carvedilol (COREG) 3.125 MG tablet Take 1 tablet by mouth twice daily (Patient taking differently: Take 3.125 mg by mouth 2 (two) times daily.) 180 tablet 3   Cyanocobalamin (VITAMIN B-12) 5000 MCG TBDP Take 5,000 mcg by mouth every morning.     docusate sodium (COLACE) 100 MG capsule Take 100 mg by mouth 2 (two) times daily.     furosemide (LASIX) 20 MG tablet Take 1 tablet (20 mg total) by mouth daily. Take 1 additional tablet by mouth for a weight gain of 178 or greater 60 tablet 1   Multiple Vitamins-Minerals (OCUVITE ADULT 50+ PO) Take 1 tablet by mouth daily.     naphazoline-pheniramine (NAPHCON-A) 0.025-0.3 % ophthalmic solution Place 2 drops into both eyes 4 (four) times daily as needed for eye irritation or allergies. 15 mL 0   nitroGLYCERIN (NITROSTAT) 0.4 MG SL tablet Place 1 tablet (0.4 mg total) under the tongue every 5 (five) minutes x 3 doses as needed for chest pain. 25 tablet 2   phenol (CHLORASEPTIC) 1.4 % LIQD Use as directed 1 spray in the mouth or throat as needed for throat irritation / pain. 20 mL 0   polyethylene glycol (MIRALAX / GLYCOLAX) 17 g packet Take 17 g by mouth daily. (Patient taking differently: Take 17 g by mouth every other day.) 14 each 0   PREVIDENT 5000 DRY MOUTH 1.1 % GEL dental gel Place 1 Application onto teeth at bedtime.     spironolactone (ALDACTONE) 25 MG tablet Take 25 mg by mouth daily. Per cardiology     No current facility-administered medications for this visit.    REVIEW OF SYSTEMS:   Constitutional: ( - ) fevers, ( - )  chills , ( - ) night sweats Eyes: ( - ) blurriness of vision, ( - ) double vision, ( - ) watery eyes Ears, nose, mouth, throat, and face: ( - ) mucositis, ( - ) sore throat Respiratory: ( - ) cough, ( - ) dyspnea, ( - ) wheezes Cardiovascular: ( - ) palpitation, ( - ) chest discomfort, ( - ) lower extremity  swelling Gastrointestinal:  ( - ) nausea, ( - ) heartburn, ( - ) change in bowel habits Skin: ( - ) abnormal skin rashes Lymphatics: ( - ) new lymphadenopathy, ( - ) easy bruising Neurological: ( - ) numbness, ( - ) tingling, ( - ) new weaknesses Behavioral/Psych: ( - ) mood change, ( - ) new changes  All other systems were reviewed with the patient and are negative.  PHYSICAL EXAMINATION: ECOG PERFORMANCE STATUS: 0 - Asymptomatic  Vitals:   08/06/22 1150  BP: (!) 130/50  Pulse: (!) 55  Resp: 17  Temp: 97.7 F (36.5 C)  SpO2: 97%    Filed Weights   08/06/22 1150  Weight: 186 lb 14.4 oz (84.8 kg)     GENERAL: Well-appearing elderly Caucasian male, alert, no distress and comfortable SKIN: skin color, texture, turgor are normal, no rashes or significant lesions EYES: conjunctiva are pink and non-injected, sclera clear LUNGS: clear to auscultation and percussion with normal breathing effort HEART: regular rate & rhythm and no murmurs and no lower extremity edema Musculoskeletal: no  cyanosis of digits and no clubbing  PSYCH: alert & oriented x 3, fluent speech NEURO: no focal motor/sensory deficits  LABORATORY DATA:  I have reviewed the data as listed    Latest Ref Rng & Units 08/06/2022   10:43 AM 06/02/2022    3:57 AM 06/01/2022   12:45 AM  CBC  WBC 4.0 - 10.5 K/uL 7.7  8.4  7.7   Hemoglobin 13.0 - 17.0 g/dL 45.4  09.8  11.9   Hematocrit 39.0 - 52.0 % 37.0  36.3  36.7   Platelets 150 - 400 K/uL 207  177  193        Latest Ref Rng & Units 08/06/2022   10:43 AM 07/14/2022   10:27 AM 06/23/2022   12:26 PM  CMP  Glucose 70 - 99 mg/dL 91  90  92   BUN 8 - 23 mg/dL 39  37  50   Creatinine 0.61 - 1.24 mg/dL 1.47  8.29  5.62   Sodium 135 - 145 mmol/L 141  140  135   Potassium 3.5 - 5.1 mmol/L 4.3  4.5  4.2   Chloride 98 - 111 mmol/L 102  100  98   CO2 22 - 32 mmol/L 33  30  26   Calcium 8.9 - 10.3 mg/dL 9.1  8.9  9.0   Total Protein 6.5 - 8.1 g/dL 7.0     Total  Bilirubin 0.3 - 1.2 mg/dL 0.9     Alkaline Phos 38 - 126 U/L 72     AST 15 - 41 U/L 13     ALT 0 - 44 U/L 10      RADIOGRAPHIC STUDIES: No results found.  ASSESSMENT & PLAN BREES MOURNING 87 y.o. male with medical history significant for newly diagnosed early stage follicular lymphoma who presents for a follow up visit.   Previously we discussed the diagnosis of follicular lymphoma.  Follicular lymphoma tends to be a more indolent lymphoma and therefore treatment is not immediately required.  We discussed the criteria the patient have to meet in order to begin treatment.  This included the GELF criteria.  As the patient is currently low stage and does not meet any of these criteria I would recommend observation at this time.  The patient and his wife were in agreement with this plan moving forward.  FLIPI Score: Low Risk (Age >60). 10 year OS is 70%  # Follicular Lymphoma Stage I-II -- Findings are consistent with a low-grade lymphoma, most likely follicular lymphoma.  At this time disease appears quite limited in stage --No clear indication to start treatment at this time as patient does not meet any GELF Criteria --given the abdominal location and possible mediastinal involvement would recommend against local therapy.  -- Labs shows white blood cell count 7.7, Hgb 12.5, MCV 97.9, Plt 207 --Plan for a return to clinic in 6 months time with CT scan as clinically indicated.   #Aortic Calcification -- Incidentally noted on a CT scan --No abnormalities on physical examination, no murmur heard --echocardiogram shows mild restriction.   No orders of the defined types were placed in this encounter.  All questions were answered. The patient knows to call the clinic with any problems, questions or concerns.  A total of more than 30 minutes were spent on this encounter with face-to-face time and non-face-to-face time, including preparing to see the patient, ordering tests and/or  medications, counseling the patient and coordination of care as outlined above.  Ulysees Barns, MD Department of Hematology/Oncology [facility] at [facility] Phone: [phone] Pager: [phone] Email: [email or web address]  08/06/2022 5:02 PM

## 2022-08-11 ENCOUNTER — Encounter: Payer: Self-pay | Admitting: Family Medicine

## 2022-08-16 DIAGNOSIS — G4733 Obstructive sleep apnea (adult) (pediatric): Secondary | ICD-10-CM | POA: Diagnosis not present

## 2022-08-24 NOTE — Progress Notes (Unsigned)
Cardiology Clinic Note   Patient Name: Jose Ware Date of Encounter: 08/26/2022  Primary Care Provider:  Shelva Majestic, MD Primary Cardiologist:  Peter Swaziland, MD  Patient Profile    Jose Ware 88 year old male presents the clinic today for follow-up evaluation of his hypertension, CAD and combined systolic and diastolic CHF.  Past Medical History    Past Medical History:  Diagnosis Date   Adenocarcinoma of prostate (HCC)    XRT & Radiation seed implantation in 2010   Adenomatous colon polyp    CAD (coronary artery disease)    a. 11/04/15:  Acute inferolateral STEMI: S/p emergent DES of a very large LCx with extensive thrombus. Significant residual disease in the proximal LAD and distal RCA. S/p staged PCI of LAD and RCA 8/29.   Cataract    CVA (cerebral infarction)    a. 10/2015: embolic CVA after heart cath    CVA (cerebral infarction)    a. 10/2015: embolic CVA after heart cath    DIVERTICULITIS, HX OF 11/27/2006        DJD (degenerative joint disease)    wrist - R    Hernia    History of blood transfusion 1985   with colon surgery   History of kidney stones    Hypertension    Ischemic cardiomyopathy    a. EF 25-30% by LV gram on cath 11/04/15. Appeared out of proportion to infarct although infarct was large. EF improved by Echo and now 40-45%.   Myocardial infarction (HCC) 2019   NEPHROLITHIASIS, HX OF 11/27/2006         Non Hodgkin's lymphoma (HCC) 11/2020   watching at this point, in abdomen, no meds or chemo yet as of 01-27-2022   Peripheral neuropathy    pt denies   Pneumonia    as a child   Psoriasis    Sleep apnea    wears cpap   Stroke [facility])    no residuals   Tenosynovitis    Past Surgical History:  Procedure Laterality Date   APPENDECTOMY  03/11/1983   BACK SURGERY  03/10/1978   minor disk surgery: instrumentation placed and removed in a second procedure   CARDIAC CATHETERIZATION N/A 11/04/2015   Procedure: Left Heart Cath  and Coronary Angiography;  Surgeon: Peter M Swaziland, MD;  Location: [facility] INVASIVE CV LAB;  Service: Cardiovascular;  Laterality: N/A;   CARDIAC CATHETERIZATION N/A 11/04/2015   Procedure: Coronary Stent Intervention;  Surgeon: Peter M Swaziland, MD;  Location: [facility] INVASIVE CV LAB;  Service: Cardiovascular;  Laterality: N/A;   CARDIAC CATHETERIZATION N/A 11/06/2015   Procedure: Coronary Stent Intervention;  Surgeon: Marykay Lex, MD;  Location: [facility] INVASIVE CV LAB;  Service: Cardiovascular;  Laterality: N/A;   CATARACT EXTRACTION, BILATERAL  03/11/2011   CHOLECYSTECTOMY  03/10/1984   COLON SURGERY  11/09/1983   pt. had ileus, had colon surgery, requiring colostomy & then reversal & then dehisence of that wound & return to OR for repair & cholecystectomy     COLONOSCOPY  2019   COLONOSCOPY WITH PROPOFOL N/A 03/13/2022   Procedure: COLONOSCOPY WITH PROPOFOL;  Surgeon: Meryl Dare, MD;  Location: [facility] ENDOSCOPY;  Service: Gastroenterology;  Laterality: N/A;   COLOSTOMY  03/11/1983   After colectomy for diverticulitis, pt. remarks during this surgery they "gave me the paddles two times  because of bleeding", pt. states it was unrelated to any anesthesia complication   EYE SURGERY     /w IOL   KNEE  ARTHROSCOPY Left 03/10/2001   KNEE SURGERY Left 03/10/1954   Left 1956, Right w/cartilage removed later   PARATHYROIDECTOMY N/A 05/12/2014   Procedure: PARATHYROIDECTOMY;  Surgeon: Darnell Level, MD;  Location: [facility] OR;  Service: General;  Laterality: N/A;   POLYPECTOMY     POLYPECTOMY  03/13/2022   Procedure: POLYPECTOMY;  Surgeon: Meryl Dare, MD;  Location: [facility];  Service: Gastroenterology;;   REVERSAL OF COLOSTOMY  03/10/1985   TONSILLECTOMY  03/10/1944   WRIST SURGERY Left    Remote complicated w/infection    Allergies  Allergies  Allergen Reactions   Bee Venom Swelling    Facial swelling   Betadine [Povidone Iodine] Other (See Comments)    blistering   Doxycycline     Possible  drug rash- back of right lower leg and onto left lower leg as well    History of Present Illness    Jose Ware has a PMH of prostate cancer, OSA, hyperlipidemia, hypertension, ischemic cardiomyopathy, coronary artery disease, PVCs, and combined systolic and diastolic CHF.  He was admitted to the hospital in 2017 with STEMI.  He underwent cardiac catheterization which showed large circumflex thrombus.  He underwent PCI with DES.  He was noted to have NSVT with reperfusion.  His catheterization also showed residual disease in his RCA and LAD.  His EF was noted to be 25% at the time of catheterization.  His echocardiogram 8/17 showed an EF of 40-45%.  He was taken back to the Cath Lab 11/06/2015 for staged PCI of his LAD and RCA.  Stenting was successful.  However, on 11/07/2015 he developed issues with finding words.  MRI of his brain showed scattered small acute infarcts in bilateral anterior and posterior circulation.  His carotid Doppler showed mild plaque 1-39%.  He was admitted 3/24 with acute on chronic combined CHF.  He presented with increased dyspnea and lower extremity swelling.  He received IV diuresis and was transition to furosemide 20 mg twice daily.  His EF was noted to be 40-45%.  His carvedilol was reduced due to bradycardia.  His discharge weight was 185 pounds.  He followed up with TOC clinic and was felt to be dry on exam.  He had been steadily trending down with his weight since discharge.  His blood pressure was low.  His Lasix was reduced to 20 mg daily and as needed.  He was seen in follow-up by Boyce Medici PA-C on 07/14/2022.  During that time he presented with his wife.  He was doing well.  He noted increased exercise tolerance.  He continued to be some limited by his chronic low back pain.  He was NYHA class II.  He denied resting dyspnea.  He denied orthopnea PND and lower extremity swelling.  His weight was up 3 pounds since reduced diuresis.  His blood pressure was  well-controlled at 128/74.  His EKG showed sinus rhythm 63 bpm with PVCs he reported compliance with his CPAP.  He was following a low-salt diet.  He presents to the clinic today for follow-up evaluation and states he continues to be somewhat physically active going to the gym 2 to 3 days/week.  He enjoys lifting weights and cycling.  He notes that he has limited in his physical activity due to back and knee pain.  He reports compliance with his CPAP.  His blood pressure today is well-controlled at 118/62.  He is euvolemic.  He is tolerating his medications well without side effects.  Will plan follow-up  in 4-6 months and plan for repeat echocardiogram at that time.  Today he denies chest pain, shortness of breath, lower extremity edema, fatigue, palpitations, melena, hematuria, hemoptysis, diaphoresis, weakness, presyncope, syncope, orthopnea, and PND.   Home Medications    Prior to Admission medications   Medication Sig Start Date End Date Taking? Authorizing Provider  acetaminophen (TYLENOL) 650 MG CR tablet Take 650 mg by mouth every 4 (four) hours as needed for pain.    [provider]  amLODipine (NORVASC) 5 MG tablet Take 1 tablet by mouth once daily 06/09/22   Shelva Majestic, MD  aspirin EC 81 MG tablet Take 1 tablet (81 mg total) by mouth daily. 11/16/15   Swaziland, Peter M, MD  atorvastatin (LIPITOR) 10 MG tablet Take 1 tablet (10 mg total) by mouth every other day. 01/10/22   Shelva Majestic, MD  carvedilol (COREG) 3.125 MG tablet Take 1 tablet by mouth twice daily Patient taking differently: Take 3.125 mg by mouth 2 (two) times daily. 02/21/22   Swaziland, Peter M, MD  Cyanocobalamin (VITAMIN B-12) 5000 MCG TBDP Take 5,000 mcg by mouth every morning.    [provider]  docusate sodium (COLACE) 100 MG capsule Take 100 mg by mouth 2 (two) times daily.    [provider]  furosemide (LASIX) 20 MG tablet Take 1 tablet (20 mg total) by mouth daily. Take 1 additional  tablet by mouth for a weight gain of 178 or greater 06/23/22   Clegg, Amy D, NP  Multiple Vitamins-Minerals (OCUVITE ADULT 50+ PO) Take 1 tablet by mouth daily. 03/11/19   [provider]  naphazoline-pheniramine (NAPHCON-A) 0.025-0.3 % ophthalmic solution Place 2 drops into both eyes 4 (four) times daily as needed for eye irritation or allergies. 03/29/22   Drema Dallas, MD  nitroGLYCERIN (NITROSTAT) 0.4 MG SL tablet Place 1 tablet (0.4 mg total) under the tongue every 5 (five) minutes x 3 doses as needed for chest pain. 12/27/20   Shelva Majestic, MD  phenol (CHLORASEPTIC) 1.4 % LIQD Use as directed 1 spray in the mouth or throat as needed for throat irritation / pain. 03/29/22   Drema Dallas, MD  polyethylene glycol (MIRALAX / GLYCOLAX) 17 g packet Take 17 g by mouth daily. Patient taking differently: Take 17 g by mouth every other day. 03/30/22   Drema Dallas, MD  PREVIDENT 5000 DRY MOUTH 1.1 % GEL dental gel Place 1 Application onto teeth at bedtime. 11/20/21   [provider]  spironolactone (ALDACTONE) 25 MG tablet Take 25 mg by mouth daily. Per cardiology    [provider]    Family History    Family History  Problem Relation Age of Onset   Coronary artery disease Mother    Alcohol abuse Father    Cirrhosis Father    Heart failure Sister    Breast cancer Sister        W/involvement of right arm leading to amputation   Anemia Sister        related to treatment to lymphoma   Non-Hodgkin's lymphoma Sister    Other Sister        died from covid 09-15-20   Coronary artery disease Brother    Heart attack Brother    Non-Hodgkin's lymphoma Brother    Clotting disorder Brother    Non-Hodgkin's lymphoma Brother    Prostate cancer Neg Hx    Colon cancer Neg Hx    Diabetes Neg Hx  Glaucoma Neg Hx    Rectal cancer Neg Hx    Esophageal cancer Neg Hx    Colon polyps Neg Hx    Stomach cancer Neg Hx    He indicated that his mother is deceased. He indicated  that his father is deceased. He indicated that both of his sisters are deceased. He indicated that both of his brothers are deceased. He indicated that his maternal grandmother is deceased. He indicated that his maternal grandfather is deceased. He indicated that his paternal grandmother is deceased. He indicated that his paternal grandfather is deceased. He indicated that the status of his neg hx is unknown.  Social History    Social History   Socioeconomic History   Marital status: Married    Spouse name: Not on file   Number of children: 3   Years of education: Not on file   Highest education level: Bachelor's degree (e.g., BA, AB, BS)  Occupational History   Not on file  Tobacco Use   Smoking status: Former    Packs/day: 2.00    Years: 30.00    Additional pack years: 0.00    Total pack years: 60.00    Types: Cigarettes    Quit date: 03/11/1983    Years since quitting: 39.4   Smokeless tobacco: Never   Tobacco comments:    started smoking in the service; ICU 62' part of his stomach; appendix; coma   Vaping Use   Vaping Use: Never used  Substance and Sexual Activity   Alcohol use: Not Currently    Comment: RARE, maybe one drink a couple times a year   Drug use: Never   Sexual activity: Not Currently  Other Topics Concern   Not on file  Social History Narrative   [employer], [place].Married 8 - Divorced 1996; remarried '03 different wife3 sons - '63, '65, '67Grandchildren -14; 1 great grand daughter; 4 great grands on wife's side, 1 great great granddaughter      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      Hobbies: fishing, busy volunteering   Social Determinants of Health   Financial Resource Strain: Low Risk  (05/20/2022)   Overall Financial Resource Strain (CARDIA)    Difficulty of Paying Living Expenses: Not hard at all  Food Insecurity: No Food Insecurity (07/09/2022)   Hunger Vital Sign    Worried About Running Out of Food in  the Last Year: Never true    Ran Out of Food in the Last Year: Never true  Transportation Needs: No Transportation Needs (07/09/2022)   PRAPARE - Administrator, Civil Service (Medical): No    Lack of Transportation (Non-Medical): No  Physical Activity: Insufficiently Active (07/09/2022)   Exercise Vital Sign    Days of Exercise per Week: 2 days    Minutes of Exercise per Session: 10 min  Stress: No Stress Concern Present (07/09/2022)   [employer] of Occupational Health - Occupational Stress Questionnaire    Feeling of Stress : Only a little  Social Connections: Socially Integrated (07/09/2022)   Social Connection and Isolation Panel [NHANES]    Frequency of Communication with Friends and Family: Twice a week    Frequency of Social Gatherings with Friends and Family: Once a week    Attends Religious Services: More than 4 times per year    Active Member of [employer] or Organizations: Yes    Attends Banker Meetings: More than 4 times per year  Marital Status: Married  Catering manager Violence: Not At Risk (05/31/2022)   Humiliation, Afraid, Rape, and Kick questionnaire    Fear of Current or Ex-Partner: No    Emotionally Abused: No    Physically Abused: No    Sexually Abused: No     Review of Systems    General:  No chills, fever, night sweats or weight changes.  Cardiovascular:  No chest pain, dyspnea on exertion, edema, orthopnea, palpitations, paroxysmal nocturnal dyspnea. Dermatological: No rash, lesions/masses Respiratory: No cough, dyspnea Urologic: No hematuria, dysuria Abdominal:   No nausea, vomiting, diarrhea, bright red blood per rectum, melena, or hematemesis Neurologic:  No visual changes, wkns, changes in mental status. All other systems reviewed and are otherwise negative except as noted above.  Physical Exam    VS:  BP 118/62 (BP Location: Right Arm, Patient Position: Sitting, Cuff Size: Normal)   Pulse (!) 45   Ht 6\' 1"  (1.854 m)   Wt  184 lb 9.6 oz (83.7 kg)   SpO2 96%   BMI 24.36 kg/m  , BMI Body mass index is 24.36 kg/m. GEN: Well nourished, well developed, in no acute distress. HEENT: normal. Neck: Supple, no JVD, carotid bruits, or masses. Cardiac: RRR, no murmurs, rubs, or gallops. No clubbing, cyanosis, edema.  Radials/DP/PT 2+ and equal bilaterally.  Respiratory:  Respirations regular and unlabored, clear to auscultation bilaterally. GI: Soft, nontender, nondistended, BS + x 4. MS: no deformity or atrophy. Skin: warm and dry, no rash. Neuro:  Strength and sensation are intact. Psych: Normal affect.  Accessory Clinical Findings    Recent Labs: 05/31/2022: B Natriuretic Peptide 1,115.8 07/14/2022: Magnesium 1.8 08/06/2022: ALT 10; BUN 39; Creatinine 1.35; Hemoglobin 12.5; Platelet Count 207; Potassium 4.3; Sodium 141   Recent Lipid Panel    Component Value Date/Time   CHOL 68 03/28/2022 0356   CHOL WILL FOLLOW 05/06/2018 1103   TRIG 56 03/28/2022 0356   HDL 20 (L) 03/28/2022 0356   HDL WILL FOLLOW 05/06/2018 1103   CHOLHDL 3.4 03/28/2022 0356   VLDL 11 03/28/2022 0356   LDLCALC 37 03/28/2022 0356   LDLCALC 25 12/23/2019 1204   LDLDIRECT 33.0 06/25/2020 1030         ECG personally reviewed by me today-none today.   Echocardiogram 06/01/2022 IMPRESSIONS     1. Inferior distal septal and apical hypokinesis . Left ventricular  ejection fraction, by estimation, is 40 to 45%. The left ventricle has  mildly decreased function. The left ventricle has no regional wall motion  abnormalities. The left ventricular  internal cavity size was mildly dilated. There is mild left ventricular  hypertrophy. Left ventricular diastolic parameters are indeterminate.   2. Right ventricular systolic function is normal. The right ventricular  size is normal. There is mildly elevated pulmonary artery systolic  pressure.   3. Left atrial size was moderately dilated.   4. The mitral valve is abnormal. Mild mitral  valve regurgitation. No  evidence of mitral stenosis.   5. The aortic valve is tricuspid. There is moderate calcification of the  aortic valve. There is moderate thickening of the aortic valve. Aortic  valve regurgitation is not visualized. Mild to moderate aortic valve  stenosis.   6. The inferior vena cava is dilated in size with >50% respiratory  variability, suggesting right atrial pressure of 8 mmHg.   FINDINGS   Left Ventricle: Inferior distal septal and apical hypokinesis. Left  ventricular ejection fraction, by estimation, is 40 to 45%. The left  ventricle  has mildly decreased function. The left ventricle has no  regional wall motion abnormalities. The left  ventricular internal cavity size was mildly dilated. There is mild left  ventricular hypertrophy. Left ventricular diastolic parameters are  indeterminate.   Right Ventricle: The right ventricular size is normal. No increase in  right ventricular wall thickness. Right ventricular systolic function is  normal. There is mildly elevated pulmonary artery systolic pressure. The  tricuspid regurgitant velocity is 2.70   m/s, and with an assumed right atrial pressure of 8 mmHg, the estimated  right ventricular systolic pressure is 37.2 mmHg.   Left Atrium: Left atrial size was moderately dilated.   Right Atrium: Right atrial size was normal in size.   Pericardium: There is no evidence of pericardial effusion.   Mitral Valve: The mitral valve is abnormal. There is mild thickening of  the mitral valve leaflet(s). There is mild calcification of the mitral  valve leaflet(s). Mild mitral annular calcification. Mild mitral valve  regurgitation. No evidence of mitral  valve stenosis.   Tricuspid Valve: The tricuspid valve is normal in structure. Tricuspid  valve regurgitation is trivial. No evidence of tricuspid stenosis.   Aortic Valve: The aortic valve is tricuspid. There is moderate  calcification of the aortic valve. There  is moderate thickening of the  aortic valve. Aortic valve regurgitation is not visualized. Mild to  moderate aortic stenosis is present. Aortic valve  mean gradient measures 9.0 mmHg. Aortic valve peak gradient measures 19.8  mmHg. Aortic valve area, by VTI measures 1.41 cm.   Pulmonic Valve: The pulmonic valve was normal in structure. Pulmonic valve  regurgitation is not visualized. No evidence of pulmonic stenosis.   Aorta: The aortic root is normal in size and structure.   Venous: The inferior vena cava is dilated in size with greater than 50%  respiratory variability, suggesting right atrial pressure of 8 mmHg.   IAS/Shunts: No atrial level shunt detected by color flow Doppler.    Cardiac catheterization 11/06/2015   Prox LAD lesion, 80 %stenosed. A STENT PROMUS PREM MR 3.5X16 drug eluting stent was successfully placed (postdilated to 4.1 mm). Post intervention, there is a 0% residual stenosis. Dist RCA lesion, 75 %stenosed. A STENT PROMUS PREM MR 2.5X16 drug eluting stent was successfully placed (postdilated to 2.7 mm). Post intervention, there is a 0% residual stenosis. Widely patent stents in the circumflex to OM 2. TIMI 3 flow distally. No residual thrombus noted.   Successful 2 vessel Staged PCI for complete revascularization post inferolateral STEMI. Widely patent recently placed stent in the circumflex system.   Plan: Return to CCU for now for post PCI care. He could be transferred to stepdown or telemetry bed if available. Continue dual antiplatelet therapy Continue aggressive cardiac risk factor modification.     Bryan Lemma, M.D., M.S. Interventional Cardiologist   Pager # [phone] Phone # [phone] [address] [place], [place] [address]  Diagnostic Dominance: Right  Intervention    Assessment & Plan   1.  Coronary artery disease-no chest pain today.  Underwent staged PCI 2017 with large thrombus in his circumflex.  He received  PCI with DES and had reperfusion NSVT.  Underwent staged PCI with stenting to his RCA and LAD.  Underwent brain MRI post cath due to neurologic symptoms.  MRI showed scattered small acute infarcts in the bilateral anterior and posterior circulation. Continue amlodipine, aspirin, atorvastatin, carvedilol Heart healthy low-sodium diet Maintain physical activity  Essential hypertension-BP today 118/62. Continue amlodipine, spironolactone,  carvedilol Maintain blood pressure log Low-sodium diet  Hyperlipidemia-LDL 37 on 03/28/22 Continue current medical therapy High-fiber diet Increase physical activity as tolerated  Chronic combined systolic and diastolic CHF-euvolemic today.  Weight stable.  NYHA class II. Echocardiogram 06/01/2022 showed EF of 40-45%, intermediate diastolic parameters, and mild-moderate aortic valve stenosis. Continue current medical therapy Daily weights-contact office with a weight increase of 2 to 3 pounds overnight or 5 pounds in 1 week. Elevate lower extremities when not active  Disposition: Follow-up with Dr. Swaziland or me in 4-6 months   Thomasene Ripple. Sarinah Doetsch NP-C     08/26/2022, 11:43 AM [facility] [address] Office [phone] Fax [phone]    I spent 13 minutes examining this patient, reviewing medications, and using patient centered shared decision making involving her cardiac care.  Prior to her visit I spent greater than 20 minutes reviewing her past medical history,  medications, and prior cardiac tests.

## 2022-08-26 ENCOUNTER — Ambulatory Visit: Payer: Medicare Other | Attending: General Practice | Admitting: General Practice

## 2022-08-26 ENCOUNTER — Encounter: Payer: Self-pay | Admitting: General Practice

## 2022-08-26 VITALS — BP 118/62 | HR 45 | Ht 73.0 in | Wt 184.6 lb

## 2022-08-26 DIAGNOSIS — I2510 Atherosclerotic heart disease of native coronary artery without angina pectoris: Secondary | ICD-10-CM | POA: Diagnosis not present

## 2022-08-26 DIAGNOSIS — I10 Essential (primary) hypertension: Secondary | ICD-10-CM

## 2022-08-26 DIAGNOSIS — E785 Hyperlipidemia, unspecified: Secondary | ICD-10-CM | POA: Diagnosis not present

## 2022-08-26 DIAGNOSIS — I5042 Chronic combined systolic (congestive) and diastolic (congestive) heart failure: Secondary | ICD-10-CM

## 2022-08-26 NOTE — Patient Instructions (Signed)
Medication Instructions:  The current medical regimen is effective;  continue present plan and medications as directed. Please refer to the Current Medication list given to you today.  *If you need a refill on your cardiac medications before your next appointment, please call your pharmacy*  Lab Work: NONE ordered at this time of appointment   If you have labs (blood work) drawn today and your tests are completely normal, you will receive your results only by:   MyChart Message (if you have MyChart) OR A paper copy in the mail If you have any lab test that is abnormal or we need to change your treatment, we will call you to review the results.  Other Instructions CONTINUE DAILY WEIGHTS   Follow-Up: At [facility], you and your health needs are our priority.  As part of our continuing mission to provide you with exceptional heart care, we have created designated Provider Care Teams.  These Care Teams include your primary Cardiologist (physician) and Advanced Practice Providers (APPs -  Physician Assistants and Nurse Practitioners) who all work together to provide you with the care you need, when you need it.  Your next appointment:   4-6 month(s)  Provider:   Peter Swaziland, MD  or Edd Fabian, FNP

## 2022-09-03 ENCOUNTER — Encounter: Payer: Self-pay | Admitting: Cardiology

## 2022-09-03 ENCOUNTER — Other Ambulatory Visit: Payer: Self-pay | Admitting: Family Medicine

## 2022-09-03 MED ORDER — FUROSEMIDE 20 MG PO TABS: 20.0000 mg | ORAL_TABLET | Freq: Every day | ORAL | 1 refills | Status: DC

## 2022-09-21 ENCOUNTER — Emergency Department (HOSPITAL_COMMUNITY): Payer: Medicare Other

## 2022-09-21 ENCOUNTER — Other Ambulatory Visit: Payer: Self-pay

## 2022-09-21 ENCOUNTER — Emergency Department (HOSPITAL_COMMUNITY)
Admission: EM | Admit: 2022-09-21 | Discharge: 2022-09-22 | Disposition: A | Discharge status: Home / Self Care | Payer: Medicare Other | Attending: Emergency Medicine | Admitting: Emergency Medicine

## 2022-09-21 DIAGNOSIS — R310 Gross hematuria: Secondary | ICD-10-CM | POA: Diagnosis not present

## 2022-09-21 DIAGNOSIS — R339 Retention of urine, unspecified: Secondary | ICD-10-CM | POA: Insufficient documentation

## 2022-09-21 DIAGNOSIS — R319 Hematuria, unspecified: Secondary | ICD-10-CM | POA: Diagnosis not present

## 2022-09-21 DIAGNOSIS — I10 Essential (primary) hypertension: Secondary | ICD-10-CM | POA: Insufficient documentation

## 2022-09-21 DIAGNOSIS — Z8546 Personal history of malignant neoplasm of prostate: Secondary | ICD-10-CM | POA: Insufficient documentation

## 2022-09-21 DIAGNOSIS — Z79899 Other long term (current) drug therapy: Secondary | ICD-10-CM | POA: Insufficient documentation

## 2022-09-21 DIAGNOSIS — Z7982 Long term (current) use of aspirin: Secondary | ICD-10-CM | POA: Insufficient documentation

## 2022-09-21 DIAGNOSIS — Z8572 Personal history of non-Hodgkin lymphomas: Secondary | ICD-10-CM | POA: Diagnosis not present

## 2022-09-21 DIAGNOSIS — I2510 Atherosclerotic heart disease of native coronary artery without angina pectoris: Secondary | ICD-10-CM | POA: Diagnosis not present

## 2022-09-21 DIAGNOSIS — R9341 Abnormal radiologic findings on diagnostic imaging of renal pelvis, ureter, or bladder: Secondary | ICD-10-CM | POA: Diagnosis not present

## 2022-09-21 LAB — CBC WITH DIFFERENTIAL/PLATELET
Abs Immature Granulocytes: 0.02 10*3/uL (ref 0.00–0.07)
Basophils Absolute: 0 10*3/uL (ref 0.0–0.1)
Basophils Relative: 0 %
Eosinophils Absolute: 0.1 10*3/uL (ref 0.0–0.5)
Eosinophils Relative: 1 %
HCT: 34.7 % — ABNORMAL LOW (ref 39.0–52.0)
Hemoglobin: 11.4 g/dL — ABNORMAL LOW (ref 13.0–17.0)
Immature Granulocytes: 0 %
Lymphocytes Relative: 11 %
Lymphs Abs: 0.9 10*3/uL (ref 0.7–4.0)
MCH: 32.6 pg (ref 26.0–34.0)
MCHC: 32.9 g/dL (ref 30.0–36.0)
MCV: 99.1 fL (ref 80.0–100.0)
Monocytes Absolute: 0.9 10*3/uL (ref 0.1–1.0)
Monocytes Relative: 11 %
Neutro Abs: 6.1 10*3/uL (ref 1.7–7.7)
Neutrophils Relative %: 77 %
Platelets: 181 10*3/uL (ref 150–400)
RBC: 3.5 MIL/uL — ABNORMAL LOW (ref 4.22–5.81)
RDW: 14.7 % (ref 11.5–15.5)
WBC: 8 10*3/uL (ref 4.0–10.5)
nRBC: 0 % (ref 0.0–0.2)

## 2022-09-21 LAB — BASIC METABOLIC PANEL
Anion gap: 9 (ref 5–15)
BUN: 38 mg/dL — ABNORMAL HIGH (ref 8–23)
CO2: 28 mmol/L (ref 22–32)
Calcium: 8.6 mg/dL — ABNORMAL LOW (ref 8.9–10.3)
Chloride: 102 mmol/L (ref 98–111)
Creatinine, Ser: 1.15 mg/dL (ref 0.61–1.24)
GFR, Estimated: >60 mL/min (ref >60)
Glucose, Bld: 114 mg/dL — ABNORMAL HIGH (ref 70–99)
Potassium: 3.5 mmol/L (ref 3.5–5.1)
Sodium: 139 mmol/L (ref 135–145)

## 2022-09-21 LAB — URINALYSIS, ROUTINE W REFLEX MICROSCOPIC

## 2022-09-21 LAB — URINALYSIS, MICROSCOPIC (REFLEX)
RBC / HPF: >50 RBC/hpf (ref 0–5)
Squamous Epithelial / HPF: NONE SEEN /HPF (ref 0–5)

## 2022-09-21 MED ORDER — CEPHALEXIN 500 MG PO CAPS: 500.0000 mg | ORAL_CAPSULE | Freq: Four times a day (QID) | ORAL | 0 refills | Status: AC

## 2022-09-21 MED ORDER — IOHEXOL 300 MG/ML  SOLN
100.0000 mL | Freq: Once | INTRAMUSCULAR | Status: AC | PRN
  Administered 2022-09-21: 100 mL via INTRAVENOUS

## 2022-09-21 MED ORDER — LACTATED RINGERS IV BOLUS
1000.0000 mL | Freq: Once | INTRAVENOUS | Status: AC
  Administered 2022-09-21: 1000 mL via INTRAVENOUS

## 2022-09-21 MED ORDER — SODIUM CHLORIDE 0.9 % IV SOLN
1.0000 g | Freq: Once | INTRAVENOUS | Status: AC
  Administered 2022-09-21: 1 g via INTRAVENOUS
  Filled 2022-09-21: qty 10

## 2022-09-21 NOTE — ED Notes (Signed)
Patient returned from CT

## 2022-09-21 NOTE — ED Notes (Signed)
Called Urology back per MD.

## 2022-09-21 NOTE — ED Provider Notes (Signed)
[place] EMERGENCY DEPARTMENT AT [facility] Provider Note   CSN: [id number] Arrival date & time: 09/21/22  1713     History  Chief Complaint  Patient presents with   Urinary Retention    Jose Ware is a 88 y.o. male.  HPI 88 year old male history of non-Hodgkin's lymphoma, prior MI, CAD, hypertension, prior prostate cancer status post radiation therapy presenting for hematuria.  He stated that he developed hematuria last night which has been persistent.  This morning developed multiple clots and difficulty urinating.  He has some suprapubic pain when he has difficulty urinating but none currently.  He is not on anticoagulation.  No back or flank pain.  No chest pain or difficulty breathing.  No fevers or chills.     Home Medications Prior to Admission medications   Medication Sig Start Date End Date Taking? Authorizing Provider  acetaminophen (TYLENOL) 650 MG CR tablet Take 650 mg by mouth every 4 (four) hours as needed for pain.   Yes [provider]  amLODipine (NORVASC) 5 MG tablet Take 1 tablet by mouth once daily 09/03/22  Yes Shelva Majestic, MD  aspirin EC 81 MG tablet Take 1 tablet (81 mg total) by mouth daily. 11/16/15  Yes [place], Peter M, MD  atorvastatin (LIPITOR) 10 MG tablet Take 1 tablet (10 mg total) by mouth every other day. 01/10/22  Yes Shelva Majestic, MD  carvedilol (COREG) 3.125 MG tablet Take 1 tablet by mouth twice daily Patient taking differently: Take 3.125 mg by mouth 2 (two) times daily. 02/21/22  Yes [place], Peter M, MD  cephALEXin (KEFLEX) 500 MG capsule Take 1 capsule (500 mg total) by mouth 4 (four) times daily for 7 days. 09/21/22 09/28/22 Yes Laurence Spates, MD  Cyanocobalamin (VITAMIN B-12) 5000 MCG TBDP Take 5,000 mcg by mouth every morning.   Yes [provider]  docusate sodium (COLACE) 100 MG capsule Take 100 mg by mouth 2 (two) times daily.   Yes [provider]  furosemide (LASIX) 20 MG tablet  Take 1 tablet (20 mg total) by mouth daily. Take 1 additional tablet by mouth for a weight gain of 178 or greater Patient taking differently: Take 20 mg by mouth 2 (two) times daily. 09/03/22  Yes Cleaver, Thomasene Ripple, NP  losartan (COZAAR) 100 MG tablet Take 100 mg by mouth daily.   Yes [provider]  Multiple Vitamins-Minerals (OCUVITE ADULT 50+ PO) Take 1 tablet by mouth daily. 03/11/19  Yes [provider]  naphazoline-pheniramine (NAPHCON-A) 0.025-0.3 % ophthalmic solution Place 2 drops into both eyes 4 (four) times daily as needed for eye irritation or allergies. 03/29/22  Yes Drema Dallas, MD  nitroGLYCERIN (NITROSTAT) 0.4 MG SL tablet Place 1 tablet (0.4 mg total) under the tongue every 5 (five) minutes x 3 doses as needed for chest pain. 12/27/20  Yes Shelva Majestic, MD  phenol (CHLORASEPTIC) 1.4 % LIQD Use as directed 1 spray in the mouth or throat as needed for throat irritation / pain. 03/29/22  Yes Drema Dallas, MD  polyethylene glycol (MIRALAX / GLYCOLAX) 17 g packet Take 17 g by mouth daily. Patient taking differently: Take 17 g by mouth every other day. 03/30/22  Yes Drema Dallas, MD  PREVIDENT 5000 DRY MOUTH 1.1 % GEL dental gel Place 1 Application onto teeth at bedtime. 11/20/21  Yes [provider]  spironolactone (ALDACTONE) 25 MG tablet Take 25 mg by mouth daily. Per cardiology   Yes  [provider]      Allergies    Bee venom, Betadine [povidone iodine], and Doxycycline    Review of Systems   Review of Systems Review of systems completed and notable as per HPI.  ROS otherwise negative.   Physical Exam Updated Vital Signs BP (!) 150/78   Pulse 77   Temp 97.9 F (36.6 C) (Oral)   Resp 18   Ht 6\' 1"  (1.854 m)   Wt 79.4 kg   SpO2 97%   BMI 23.09 kg/m  Physical Exam Vitals and nursing note reviewed. Exam conducted with a chaperone present.  Constitutional:      General: He is not in acute distress.    Appearance: He is  well-developed.  HENT:     Head: Normocephalic and atraumatic.     Mouth/Throat:     Mouth: Mucous membranes are moist.     Pharynx: Oropharynx is clear.  Eyes:     Extraocular Movements: Extraocular movements intact.     Conjunctiva/sclera: Conjunctivae normal.     Pupils: Pupils are equal, round, and reactive to light.  Cardiovascular:     Rate and Rhythm: Normal rate and regular rhythm.     Heart sounds: No murmur heard. Pulmonary:     Effort: Pulmonary effort is normal. No respiratory distress.     Breath sounds: Normal breath sounds.  Abdominal:     Palpations: Abdomen is soft.     Tenderness: There is no abdominal tenderness. There is no right CVA tenderness, left CVA tenderness, guarding or rebound.  Genitourinary:    Penis: Normal.      Testes: Normal.  Musculoskeletal:        General: No swelling.     Cervical back: Neck supple.  Skin:    General: Skin is warm and dry.     Capillary Refill: Capillary refill takes less than 2 seconds.  Neurological:     Mental Status: He is alert.  Psychiatric:        Mood and Affect: Mood normal.     ED Results / Procedures / Treatments   Labs (all labs ordered are listed, but only abnormal results are displayed) Labs Reviewed  URINALYSIS, ROUTINE W REFLEX MICROSCOPIC - Abnormal; Notable for the following components:      Result Value   Color, Urine BROWN (*)    APPearance TURBID (*)    Glucose, UA   (*)    Value: TEST NOT REPORTED DUE TO COLOR INTERFERENCE OF URINE PIGMENT   Hgb urine dipstick   (*)    Value: TEST NOT REPORTED DUE TO COLOR INTERFERENCE OF URINE PIGMENT   Bilirubin Urine   (*)    Value: TEST NOT REPORTED DUE TO COLOR INTERFERENCE OF URINE PIGMENT   Ketones, ur   (*)    Value: TEST NOT REPORTED DUE TO COLOR INTERFERENCE OF URINE PIGMENT   Protein, ur   (*)    Value: TEST NOT REPORTED DUE TO COLOR INTERFERENCE OF URINE PIGMENT   Nitrite   (*)    Value: TEST NOT REPORTED DUE TO COLOR INTERFERENCE OF URINE  PIGMENT   Leukocytes,Ua   (*)    Value: TEST NOT REPORTED DUE TO COLOR INTERFERENCE OF URINE PIGMENT   All other components within normal limits  CBC WITH DIFFERENTIAL/PLATELET - Abnormal; Notable for the following components:   RBC 3.50 (*)    Hemoglobin 11.4 (*)    HCT 34.7 (*)    All other components within normal limits  BASIC METABOLIC PANEL - Abnormal; Notable for the following components:   Glucose, Bld 114 (*)    BUN 38 (*)    Calcium 8.6 (*)    All other components within normal limits  URINALYSIS, MICROSCOPIC (REFLEX) - Abnormal; Notable for the following components:   Bacteria, UA RARE (*)    All other components within normal limits    EKG None  Radiology CT HEMATURIA WORKUP  Result Date: 09/21/2022 CLINICAL DATA:  Hematuria. History of prostate cancer. Unable to void since 1400. EXAM: CT ABDOMEN AND PELVIS WITHOUT AND WITH CONTRAST TECHNIQUE: Multidetector CT imaging of the abdomen and pelvis was performed following the standard protocol before and following the bolus administration of intravenous contrast. RADIATION DOSE REDUCTION: This exam was performed according to the departmental dose-optimization program which includes automated exposure control, adjustment of the mA and/or kV according to patient size and/or use of iterative reconstruction technique. CONTRAST:  OMNIPAQUE IOHEXOL 300 MG/ML  SOLN COMPARISON:  CT abdomen and pelvis 03/20/2022 FINDINGS: Lower chest: No acute abnormality. Hepatobiliary: Cholecystectomy. Unremarkable liver and biliary tree. Pancreas: Unremarkable. Spleen: Unremarkable. Adrenals/Urinary Tract: Stable adrenal glands. Low-attenuation lesions in the kidneys are statistically likely to represent cysts. No follow-up is required. No urinary calculi or hydronephrosis. Filling defects in the posterior bladder on delayed images (series 24/image 35). There is some thickening and suggestion of irregular bladder wall enhancement in this area (series  20/image 64 and 12/75). Stomach/Bowel: Normal caliber large and small bowel. Postoperative change about the sigmoid colon. No bowel wall thickening. The appendix is not visualized. Stomach is within normal limits. Vascular/Lymphatic: Aortic atherosclerotic calcification without aneurysm. Increased size of a retrocrural lymph node now measuring 18 mm (12/14), previously 15 mm. Bulky para-aortic lymphadenopathy has also increased in size. For example right periaortic nodal mass measuring 3.9 x 2.3 cm on series 12/image 33, previously measured 3.2 x 2.2 cm. A left periaortic nodal mass is unchanged in size measuring 4.4 x 3.8 cm on 12/32. Reproductive: Metallic seeds in the prostate. Other: No free intraperitoneal fluid or air. Musculoskeletal: Similar lytic lesions in the lumbar vertebral bodies. Chronic superior endplate compression of L1. No acute fracture. IMPRESSION: 1. Filling defects in the posterior bladder on delayed images with some thickening and suggestion of irregular bladder wall enhancement in this area. Findings may be due to adherent clot however are concerning for urothelial neoplasm. Recommend urology consult. 2. Increased size of a few bulky retroperitoneal and retrocrural lymph nodes. 3. Similar lytic lesions in the lumbar vertebral bodies. Aortic Atherosclerosis (ICD10-I70.0). Electronically Signed   By: Minerva Fester M.D.   On: 09/21/2022 22:53    Procedures Procedures    Medications Ordered in ED Medications  lactated ringers bolus 1,000 mL (1,000 mLs Intravenous New Bag/Given 09/21/22 2304)  cefTRIAXone (ROCEPHIN) 1 g in sodium chloride 0.9 % 100 mL IVPB (1 g Intravenous New Bag/Given 09/21/22 2303)  iohexol (OMNIPAQUE) 300 MG/ML solution 100 mL (100 mLs Intravenous Contrast Given 09/21/22 2207)    ED Course/ Medical Decision Making/ A&P                             Medical Decision Making Amount and/or Complexity of Data Reviewed Labs: ordered. Radiology:  ordered.  Risk Prescription drug management.   Medical Decision Making:   GIANNIS LOCHER is a 88 y.o. male who presented to the ED today with gross hematuria.  Vital signs notable for mild hypertension.  On exam  he is well-appearing, no abdominal pain or tenderness.  Will defer imaging at this time given plan for discussion with urology.  I am concerned for possible hemorrhagic cystitis, mass.  He is not anticoagulated.  Urology's been consulted and tried to call multiple times have not been able to reach them.  Patient placed on continuous vitals and telemetry monitoring while in ED which was reviewed periodically.  Reviewed and confirmed nursing documentation for past medical history, family history, social history.  Initial Study Results:   Laboratory  All laboratory results reviewed.  Labs notable for anemia similar to prior.  No leukocytosis.  Baseline renal function.  Urinalysis with significant hematuria, some bacteriuria.  Radiology:  All images reviewed independently.  CT scan concerning for possible clot in the bladder wall versus mass.  Agree with radiology report at this time.    Consults: Case discussed with urology.   Reassessment and Plan:   I tried for 2 hours to get a hold of urology, there was a problem with the paging system.  I was able to reach Dr. Marlou Porch with urology.  He recommended CT hematuria study, antibiotics.  He stated Foley would be recommended if patient is still having clots.  On my reevaluation he is having intermittent urinary retention and I am concerned he can develop additional clot causing him to have recurrent urinary retention.  I discussed this with the patient, will plan for antibiotics for possible infection, CT study to evaluate for cause, and Foley placement.  Lab reviewed, anemia stable.  He is hemodynamically stable no signs of acute blood loss.  CT study concerning for possible bladder mass versus adherent clot.  Foley was placed and is  draining.  I discussed this with urology, they feel he can follow-up closely as an outpatient.  Discussed plan for discharge with Foley, antibiotics.  I discussed with the patient, he is comfortable with this plan.  Given prescription for antibiotics due to concern for possible infection.  Strict return precautions given for difficulty with Foley drainage or any other concerns including fever or severe pain.  Patient's presentation is most consistent with acute complicated illness / injury requiring diagnostic workup.           Final Clinical Impression(s) / ED Diagnoses Final diagnoses:  Gross hematuria    Rx / DC Orders ED Discharge Orders          Ordered    cephALEXin (KEFLEX) 500 MG capsule  4 times daily        09/21/22 2326              Laurence Spates, MD 09/21/22 2344

## 2022-09-21 NOTE — ED Notes (Signed)
Patient transported to CT 

## 2022-09-21 NOTE — ED Triage Notes (Signed)
Pt reports hx of prostate cancer, noting blood in urine starting last night, passing clots today, and now unable to void since around 1400.

## 2022-09-21 NOTE — Discharge Instructions (Addendum)
Your CT scan is concerning for possible blood clot in the bladder versus a mass.  You need to call the number above on Monday morning to schedule follow-up with urology.  A Foley catheter was placed, if this stops draining or you develop abdominal pain or fever you should return to the ED.  You are also being sent on antibiotics due to concern for possible urinary tract infection.

## 2022-09-23 ENCOUNTER — Ambulatory Visit: Payer: Self-pay

## 2022-09-23 NOTE — Patient Instructions (Signed)
Visit Information  Thank you for taking time to visit with me today. Please don't hesitate to contact me if I can be of assistance to you.   Following are the goals we discussed today:   Goals Addressed             This Visit's Progress    Heart Failure Management       Patient Goals/Self Care Activities: -Patient/Caregiver will self-administer medications as prescribed as evidenced by self-report/primary caregiver report  -Patient/Caregiver will attend all scheduled provider appointments as evidenced by clinician review of documented attendance to scheduled appointments and patient/caregiver report -Patient/Caregiver will call provider office for new concerns or questions as evidenced by review of documented incoming telephone call notes and patient report  -Weigh daily and record (notify MD with 3 lb weight gain over night or 5 lb in a week) -Follow CHF Action Plan -Adhere to low sodium diet Current weight 174.5 lbs      Patient with recent Hematuria.  Urology consult on Thursday.  Hx of Prostate Cancer.  Patient has foley catheter with amber and bloody urine at times. Discussed foley care.              Our next appointment is by telephone on 10/16/22 at 1030 am  Please call the care guide team at [phone] if you need to cancel or reschedule your appointment.   If you are experiencing a Mental Health or Behavioral Health Crisis or need someone to talk to, please call the Suicide and Crisis Lifeline: 988   Patient verbalizes understanding of instructions and care plan provided today and agrees to view in MyChart. Active MyChart status and patient understanding of how to access instructions and care plan via MyChart confirmed with patient.     The patient has been provided with contact information for the care management team and has been advised to call with any health related questions or concerns.   Bary Leriche, RN, MSN [facility] Care Management Care Management  Coordinator Direct Line [phone]

## 2022-09-23 NOTE — Patient Outreach (Signed)
  Care Coordination   Follow Up Visit Note   09/23/2022 Name: Jose Ware MRN: [id number] DOB: 15-Dec-1934  Jose Ware is a 88 y.o. year old male who sees Durene Cal, Aldine Contes, MD for primary care. I spoke with  Jose Ware by phone today.  What matters to the patients health and wellness today?  hematuria    Goals Addressed             This Visit's Progress    Heart Failure Management       Patient Goals/Self Care Activities: -Patient/Caregiver will self-administer medications as prescribed as evidenced by self-report/primary caregiver report  -Patient/Caregiver will attend all scheduled provider appointments as evidenced by clinician review of documented attendance to scheduled appointments and patient/caregiver report -Patient/Caregiver will call provider office for new concerns or questions as evidenced by review of documented incoming telephone call notes and patient report  -Weigh daily and record (notify MD with 3 lb weight gain over night or 5 lb in a week) -Follow CHF Action Plan -Adhere to low sodium diet Current weight 174.5 lbs      Patient with recent Hematuria.  Urology consult on Thursday.  Hx of Prostate Cancer.  Patient has foley catheter with amber and bloody urine at times. Discussed foley care.              SDOH assessments and interventions completed:  Yes     Care Coordination Interventions:  Yes, provided   Follow up plan: Follow up call scheduled for August    Encounter Outcome:  Pt. Visit Completed   Bary Leriche, RN, MSN [facility] Care Management Care Management Coordinator Direct Line [phone]

## 2022-09-25 DIAGNOSIS — R310 Gross hematuria: Secondary | ICD-10-CM | POA: Diagnosis not present

## 2022-09-26 ENCOUNTER — Telehealth: Payer: Self-pay

## 2022-09-26 NOTE — Telephone Encounter (Signed)
Transition Care Management Follow-up Telephone Call Date of discharge and from where: 09/22/2022 [facility] How have you been since you were released from the hospital? Patient is feeling better after catheter was removed. Any questions or concerns? No  Items Reviewed: Did the pt receive and understand the discharge instructions provided? Yes  Medications obtained and verified? Yes  Other? No  Any new allergies since your discharge? No  Dietary orders reviewed? Yes Do you have support at home? Yes   Follow up appointments reviewed:  PCP Hospital f/u appt confirmed? No  Scheduled to see  on  @ . Specialist Hospital f/u appt confirmed? No  Scheduled to see  on  @ . Are transportation arrangements needed? No  If their condition worsens, is the pt aware to call PCP or go to the Emergency Dept.? Yes Was the patient provided with contact information for the PCP's office or ED? Yes Was to pt encouraged to call back with questions or concerns? Yes  Lincon Sahlin [facility]  [facility] Population Health Community Resource Care Guide   [email or web address]  ?? [phone]   Website: [email or web address]  [email or web address]

## 2022-09-30 DIAGNOSIS — Z961 Presence of intraocular lens: Secondary | ICD-10-CM | POA: Diagnosis not present

## 2022-09-30 DIAGNOSIS — H52221 Regular astigmatism, right eye: Secondary | ICD-10-CM | POA: Diagnosis not present

## 2022-09-30 DIAGNOSIS — H524 Presbyopia: Secondary | ICD-10-CM | POA: Diagnosis not present

## 2022-09-30 DIAGNOSIS — H5213 Myopia, bilateral: Secondary | ICD-10-CM | POA: Diagnosis not present

## 2022-09-30 DIAGNOSIS — H353112 Nonexudative age-related macular degeneration, right eye, intermediate dry stage: Secondary | ICD-10-CM | POA: Diagnosis not present

## 2022-09-30 DIAGNOSIS — H35423 Microcystoid degeneration of retina, bilateral: Secondary | ICD-10-CM | POA: Diagnosis not present

## 2022-09-30 DIAGNOSIS — H353124 Nonexudative age-related macular degeneration, left eye, advanced atrophic with subfoveal involvement: Secondary | ICD-10-CM | POA: Diagnosis not present

## 2022-10-30 ENCOUNTER — Other Ambulatory Visit: Payer: Self-pay | Admitting: General Practice

## 2022-11-01 ENCOUNTER — Other Ambulatory Visit: Payer: Self-pay | Admitting: Family Medicine

## 2022-11-04 ENCOUNTER — Ambulatory Visit: Payer: Self-pay

## 2022-11-04 NOTE — Patient Outreach (Signed)
  Care Coordination   11/04/2022 Name: Jose Ware MRN: [id number] DOB: 24-Nov-1934   Care Coordination Outreach Attempts:  An unsuccessful telephone outreach was attempted for a scheduled appointment today.  Follow Up Plan:  Additional outreach attempts will be made to offer the patient care coordination information and services.   Encounter Outcome:  No Answer   Care Coordination Interventions:  No, not indicated    Bary Leriche, RN, MSN [facility]  [facility], [facility] Management Community Coordinator Direct Dial: [phone]  Fax: [phone] Website: [email or web address]

## 2022-11-06 ENCOUNTER — Telehealth: Payer: Self-pay

## 2022-11-06 NOTE — Patient Outreach (Signed)
  Care Coordination   Follow Up Visit Note   11/06/2022 Name: Jose Ware MRN: [id number] DOB: [birth year]  Jose Ware is a 88 y.o. year old male who sees Durene Cal, Aldine Contes, MD for primary care. I spoke with  Jose Ware by phone today.  What matters to the patients health and wellness today?  Managing health    Goals Addressed             This Visit's Progress    Heart Failure Management       Patient Goals/Self Care Activities: -Patient/Caregiver will self-administer medications as prescribed as evidenced by self-report/primary caregiver report  -Patient/Caregiver will attend all scheduled provider appointments as evidenced by clinician review of documented attendance to scheduled appointments and patient/caregiver report -Patient/Caregiver will call provider office for new concerns or questions as evidenced by review of documented incoming telephone call notes and patient report  -Weigh daily and record (notify MD with 3 lb weight gain over night or 5 lb in a week) -Follow CHF Action Plan -Adhere to low sodium diet Current weight 174.5 lbs      Patient reports doing okay.  Realizing that some things he cannot do. Discussed pacing self with activities and not getting frustrated.  Weight 181 lbs.  Discussed heart failure management and weight parameters.  He verbalized understanding and voices no concerns.        SDOH assessments and interventions completed:  Yes     Care Coordination Interventions:  Yes, provided   Follow up plan: Follow up call scheduled for September    Encounter Outcome:  Pt. Visit Completed   Bary Leriche, RN, MSN [facility]  [facility], [facility] Management Community Coordinator Direct Dial: [phone]  Fax: [phone] Website: [email or web address]

## 2022-11-06 NOTE — Patient Instructions (Signed)
Visit Information  Thank you for taking time to visit with me today. Please don't hesitate to contact me if I can be of assistance to you.   Following are the goals we discussed today:   Goals Addressed             This Visit's Progress    Heart Failure Management       Patient Goals/Self Care Activities: -Patient/Caregiver will self-administer medications as prescribed as evidenced by self-report/primary caregiver report  -Patient/Caregiver will attend all scheduled provider appointments as evidenced by clinician review of documented attendance to scheduled appointments and patient/caregiver report -Patient/Caregiver will call provider office for new concerns or questions as evidenced by review of documented incoming telephone call notes and patient report  -Weigh daily and record (notify MD with 3 lb weight gain over night or 5 lb in a week) -Follow CHF Action Plan -Adhere to low sodium diet Current weight 174.5 lbs      Patient reports doing okay.  Realizing that some things he cannot do. Discussed pacing self with activities and not getting frustrated.  Weight 181 lbs.  Discussed heart failure management and weight parameters.  He verbalized understanding and voices no concerns.        Our next appointment is by telephone on 12/04/22 at 1100 am  Please call the care guide team at [phone] if you need to cancel or reschedule your appointment.   If you are experiencing a Mental Health or Behavioral Health Crisis or need someone to talk to, please call the Suicide and Crisis Lifeline: 988   Patient verbalizes understanding of instructions and care plan provided today and agrees to view in MyChart. Active MyChart status and patient understanding of how to access instructions and care plan via MyChart confirmed with patient.     The patient has been provided with contact information for the care management team and has been advised to call with any health related questions or  concerns.   Bary Leriche, RN, MSN [facility], [facility] Management Community Coordinator Direct Dial: [phone]  Fax: [phone] Website: [email or web address]

## 2022-11-11 ENCOUNTER — Other Ambulatory Visit: Payer: Self-pay

## 2022-11-11 MED ORDER — ATORVASTATIN CALCIUM 10 MG PO TABS: 10.0000 mg | ORAL_TABLET | ORAL | 3 refills | Status: DC

## 2022-12-04 ENCOUNTER — Ambulatory Visit: Payer: Self-pay

## 2022-12-04 NOTE — Patient Outreach (Signed)
Care Coordination   Follow Up Visit Note   12/04/2022 Name: Jose Ware MRN: [id number] DOB: 03/08/1935  Jose Ware is a 88 y.o. year old male who sees Durene Cal, Aldine Contes, MD for primary care. I spoke with  Jose Ware by phone today.  What matters to the patients health and wellness today?  Maintain health    Goals Addressed             This Visit's Progress    Heart Failure Management       Patient Goals/Self Care Activities: -Patient/Caregiver will self-administer medications as prescribed as evidenced by self-report/primary caregiver report  -Patient/Caregiver will attend all scheduled provider appointments as evidenced by clinician review of documented attendance to scheduled appointments and patient/caregiver report -Patient/Caregiver will call provider office for new concerns or questions as evidenced by review of documented incoming telephone call notes and patient report  -Weigh daily and record (notify MD with 3 lb weight gain over night or 5 lb in a week) -Follow CHF Action Plan -Adhere to low sodium diet Current weight 174.5 lbs      Patient reports doing good.  Still realizing that some things he cannot do. Discussed pacing self with activities.  Weight 174 lbs.  Discussed heart failure management and weight parameters.  He verbalized understanding and voices no concerns.        SDOH assessments and interventions completed:  Yes  SDOH Interventions Today    Flowsheet Row Most Recent Value  SDOH Interventions   Health Literacy Interventions Intervention Not Indicated        Care Coordination Interventions:  Yes, provided   Follow up plan: Follow up call scheduled for October    Encounter Outcome:  Patient Visit Completed   Bary Leriche, RN, MSN [place]  [facility], [facility] Management Community Coordinator Direct Dial: [phone]  Fax: [phone] Website: [email or web address]

## 2022-12-04 NOTE — Patient Instructions (Signed)
Visit Information  Thank you for taking time to visit with me today. Please don't hesitate to contact me if I can be of assistance to you.   Following are the goals we discussed today:   Goals Addressed             This Visit's Progress    Heart Failure Management       Patient Goals/Self Care Activities: -Patient/Caregiver will self-administer medications as prescribed as evidenced by self-report/primary caregiver report  -Patient/Caregiver will attend all scheduled provider appointments as evidenced by clinician review of documented attendance to scheduled appointments and patient/caregiver report -Patient/Caregiver will call provider office for new concerns or questions as evidenced by review of documented incoming telephone call notes and patient report  -Weigh daily and record (notify MD with 3 lb weight gain over night or 5 lb in a week) -Follow CHF Action Plan -Adhere to low sodium diet Current weight 174.5 lbs      Patient reports doing good.  Still realizing that some things he cannot do. Discussed pacing self with activities.  Weight 174 lbs.  Discussed heart failure management and weight parameters.  He verbalized understanding and voices no concerns.        Our next appointment is by telephone on 01/01/23 at 1100 am  Please call the care guide team at [phone] if you need to cancel or reschedule your appointment.   If you are experiencing a Mental Health or Behavioral Health Crisis or need someone to talk to, please call the Suicide and Crisis Lifeline: 988   Patient verbalizes understanding of instructions and care plan provided today and agrees to view in MyChart. Active MyChart status and patient understanding of how to access instructions and care plan via MyChart confirmed with patient.     The patient has been provided with contact information for the care management team and has been advised to call with any health related questions or concerns.   Bary Leriche, RN, MSN [facility], [facility] Management Community Coordinator Direct Dial: [phone]  Fax: [phone] Website: [email or web address]

## 2022-12-23 NOTE — Progress Notes (Unsigned)
Cardiology Office Note:    Date:  12/23/2022  ID:  Jose Ware, DOB 1934-06-17, MRN [id number] PCP: Shelva Majestic, MD  [facility] Providers Cardiologist:  Peter Swaziland, MD { Click to update primary MD,subspecialty MD or APP then REFRESH:1}    {Click to Open Review  :1}   Patient Profile:      Hypertension Coronary artery disease Ischemic cardiomyopathy Combined systolic and diastolic heart failure      History of Present Illness:  Discussed the use of AI scribe software for clinical note transcription with the patient, who gave verbal consent to proceed.  Jose Ware is a 88 y.o. male who returns for follow-up for chronic combined systolic and diastolic heart failure, coronary artery disease.  In 2017 admitted with STEMI, 11/04/2015 cardiac catheterization showed circumflex thrombus, he underwent PCI with DES.  With reperfusion NSVT was noted.  Catheterization at that time also showed residual disease in the LAD (80% stenosed) and RCA (75% stenosed), with EF at 25%.  11/06/2015 he was taken back to the Cath Lab for PCI/DES of his LAD and RCA which was successful.  During this hospitalization on 10/2015 he found difficulty with finding words, MRI of his brain was performed which showed acute infarcts in bilateral anterior and posterior circulation and carotid Doppler showed mild plaque 1 to 39% to bilateral internal carotid arteries.   In 05/2022 he was admitted to the hospital with acute on chronic congestive heart failure.  His EF was noted to be 40 to 45%, he received IV diuresis and was transitioned to furosemide 20 mg twice daily.  At that time his carvedilol was reduced due to bradycardia.  On discharge follow-up with the [facility] clinic his weight was trending down and thought to be dry, his Lasix was reduced to 20 mg daily and as needed.  Was seen in clinic on 07/14/2022, NYHA class II.  He was doing well from a cardiac standpoint.  Was seen 08/26/2022, he was  euvolemic, tolerating his medications, lifting weights and cycling.  It was noted physical activity was limited due to back and knee pain.  History of Present Illness            ROS   See HPI ***    Studies Reviewed:       Echocardiogram 06/01/2022 IMPRESSIONS   1. Inferior distal septal and apical hypokinesis . Left ventricular  ejection fraction, by estimation, is 40 to 45%. The left ventricle has  mildly decreased function. The left ventricle has no regional wall motion  abnormalities. The left ventricular  internal cavity size was mildly dilated. There is mild left ventricular  hypertrophy. Left ventricular diastolic parameters are indeterminate.   2. Right ventricular systolic function is normal. The right ventricular  size is normal. There is mildly elevated pulmonary artery systolic  pressure.   3. Left atrial size was moderately dilated.   4. The mitral valve is abnormal. Mild mitral valve regurgitation. No  evidence of mitral stenosis.   5. The aortic valve is tricuspid. There is moderate calcification of the  aortic valve. There is moderate thickening of the aortic valve. Aortic  valve regurgitation is not visualized. Mild to moderate aortic valve  stenosis.   6. The inferior vena cava is dilated in size with >50% respiratory  variability, suggesting right atrial pressure of 8 mmHg.   Risk Assessment/Calculations:   {Does this patient have ATRIAL FIBRILLATION?:[phone]} No BP recorded.  {Refresh Note OR Click here to enter BP  :  1}***       Physical Exam:   VS:  There were no vitals taken for this visit.   Wt Readings from Last 3 Encounters:  09/21/22 175 lb (79.4 kg)  08/26/22 184 lb 9.6 oz (83.7 kg)  08/06/22 186 lb 14.4 oz (84.8 kg)    Physical Exam***     Assessment and Plan:  Chronic combined systolic and diastolic CHF -Echo 06/01/2022 showed EF 40 to 45%, inferior distal septal and apical hypokinesis, no RWMA, RV okay, mild to moderate aortic valve  stenosis  Coronary artery disease -11/04/2015, PCI with DES to circumflex. -11/06/2015 staged PCI with DES to dist RCA and prox LAD  Hypertension  Hyperlipidemia              {Are you ordering a CV Procedure (e.g. stress test, cath, DCCV, TEE, etc)?   Press F2        :[id number]}  Dispo:  No follow-ups on file.  Signed, Denyce Robert, AGNP-C

## 2022-12-24 ENCOUNTER — Encounter: Payer: Self-pay | Admitting: General Practice

## 2022-12-24 ENCOUNTER — Ambulatory Visit: Payer: Medicare Other | Attending: General Practice | Admitting: Emergency Medicine

## 2022-12-24 VITALS — BP 114/60 | HR 62 | Ht 73.0 in | Wt 196.0 lb

## 2022-12-24 DIAGNOSIS — I2510 Atherosclerotic heart disease of native coronary artery without angina pectoris: Secondary | ICD-10-CM | POA: Diagnosis not present

## 2022-12-24 DIAGNOSIS — I493 Ventricular premature depolarization: Secondary | ICD-10-CM | POA: Diagnosis not present

## 2022-12-24 DIAGNOSIS — I5042 Chronic combined systolic (congestive) and diastolic (congestive) heart failure: Secondary | ICD-10-CM

## 2022-12-24 DIAGNOSIS — I10 Essential (primary) hypertension: Secondary | ICD-10-CM | POA: Diagnosis not present

## 2022-12-24 DIAGNOSIS — E785 Hyperlipidemia, unspecified: Secondary | ICD-10-CM

## 2022-12-24 NOTE — Patient Instructions (Addendum)
Medication Instructions:  The current medical regimen is effective;  continue present plan and medications as directed. Please refer to the Current Medication list given to you today.  *If you need a refill on your cardiac medications before your next appointment, please call your pharmacy*  Lab Work: NONE  Your physician has requested that you have an echocardiogram. Echocardiography is a painless test that uses sound waves to create images of your heart. It provides your doctor with information about the size and shape of your heart and how well your heart's chambers and valves are working. This procedure takes approximately one hour. There are no restrictions for this procedure. Please do NOT wear cologne, perfume, aftershave, or lotions (deodorant is allowed). Please arrive 15 minutes prior to your appointment time.   Other Instructions TAKE AND LOG YOUR WEIGHT, BLOOD PRESSURE, HEART RATE-IF YOU GAIN 2LBS OVERNIGHT OR 5LBS WEEKLY CALL TO DISCUSS NEXT STEPS  Follow-Up: At [facility], you and your health needs are our priority.  As part of our continuing mission to provide you with exceptional heart care, we have created designated Provider Care Teams.  These Care Teams include your primary Cardiologist (physician) and Advanced Practice Providers (APPs -  Physician Assistants and Nurse Practitioners) who all work together to provide you with the care you need, when you need it.  Your next appointment:   6 month(s)  Provider:   Peter Swaziland, MD  or Edd Fabian, FNP or Rise Paganini, NP

## 2022-12-27 ENCOUNTER — Other Ambulatory Visit: Payer: Self-pay | Admitting: Family Medicine

## 2022-12-29 ENCOUNTER — Encounter: Payer: Self-pay | Admitting: Family Medicine

## 2022-12-29 ENCOUNTER — Ambulatory Visit (INDEPENDENT_AMBULATORY_CARE_PROVIDER_SITE_OTHER): Payer: Medicare Other | Admitting: Family Medicine

## 2022-12-29 VITALS — BP 128/68 | HR 62 | Temp 97.8°F | Ht 73.0 in | Wt 200.2 lb

## 2022-12-29 DIAGNOSIS — S8992XA Unspecified injury of left lower leg, initial encounter: Secondary | ICD-10-CM | POA: Diagnosis not present

## 2022-12-29 NOTE — Progress Notes (Signed)
Subjective  CC:  Chief Complaint  Patient presents with   Knee Injury    Pt c/o of left knee injury, pt fell in a [employer] parking lot on last Monday.     Same day acute visit; PCP not available. New pt to me. Chart reviewed.  HPI: Jose Ware is a 88 y.o. male who presents to the office today to address the problems listed above in the chief complaint. DOI: 10/14.  Very pleasant 88 year old male with history of arthritis in the left knee, status post meniscal surgery back in the 60s, reports he was stepping off the curb at [place] last week, his knee popped and gave way.  He went to the ground.  He did have an abrasion to his right elbow and right knee.  However he had acute pain of the left knee, acute swelling and was unable to bear weight until a few days ago.  The knee is swollen.  He is now able to stand on it and he is using a walker to ambulate.  Assessment  1. Left knee injury, initial encounter      Plan  Left knee injury with effusion: History of surgery.  History of arthritis.  Refer directly to sports medicine for possible diagnostic arthrocentesis.  May warrant imaging etc  Patient agrees.  Urgent referral placed.  Follow up: To sports medicine 01/12/2023  Orders Placed This Encounter  Procedures   Ambulatory referral to Sports Medicine   No orders of the defined types were placed in this encounter.     I reviewed the patients updated PMH, FH, and SocHx.    Patient Active Problem List   Diagnosis Date Noted   Chronic heart failure (HCC) 05/31/2022   Bigeminy 05/31/2022   Follicular lymphoma, unspecified, intra-abdominal lymph nodes (HCC) 04/11/2021   History of small bowel obstruction 11/13/2020   OSA on CPAP 05/08/2020   Pain of back and left lower extremity 06/08/2019   Left knee pain 06/06/2019   Onychomycosis 10/28/2017   Spondylosis 06/25/2017   Post herpetic neuralgia 06/20/2017   Spinal stenosis of lumbar region at multiple levels 04/30/2017    Drug reaction 02/05/2017   Senile purpura (HCC) 02/05/2017   Dyslipidemia 11/16/2015   History of ventricular tachycardia 11/08/2015   Coronary artery disease involving native coronary artery of native heart without angina pectoris    Ischemic cardiomyopathy    History of embolic stroke    Post-traumatic osteoarthritis of right wrist 04/25/2015   Hx of adenomatous colonic polyps 01/18/2015   History of primary hyperparathyroidism s/p parathyroidectomy (benign adenoma) 12/28/2013   History of stomach cancer 12/01/2013   Fatigue 12/01/2013   Lower back pain 03/24/2013   Bradycardia 09/23/2012   B12 deficiency 09/17/2010   History of prostate cancer 12/14/2008   Hereditary and idiopathic peripheral neuropathy 10/18/2007   PSORIASIS 10/15/2007   Essential hypertension 11/27/2006   Osteoarthritis 11/27/2006   NEPHROLITHIASIS, HX OF 11/27/2006   Current Meds  Medication Sig   acetaminophen (TYLENOL) 650 MG CR tablet Take 650 mg by mouth every 4 (four) hours as needed for pain.   amLODipine (NORVASC) 5 MG tablet Take 1 tablet by mouth once daily   aspirin EC 81 MG tablet Take 1 tablet (81 mg total) by mouth daily.   atorvastatin (LIPITOR) 10 MG tablet Take 1 tablet (10 mg total) by mouth every other day.   carvedilol (COREG) 3.125 MG tablet Take 1 tablet by mouth twice daily (Patient taking differently: Take 3.125 mg  by mouth 2 (two) times daily.)   Cyanocobalamin (VITAMIN B-12) 5000 MCG TBDP Take 5,000 mcg by mouth every morning.   docusate sodium (COLACE) 100 MG capsule Take 100 mg by mouth 2 (two) times daily.   furosemide (LASIX) 20 MG tablet TAKE 1 TABLET BY MOUTH ONCE DAILY. TAKE 1 ADDITIONAL TABLET BY MOUTH FOR A WEIGHT GAIN OF 178 OR GREATER   Multiple Vitamins-Minerals (OCUVITE ADULT 50+ PO) Take 1 tablet by mouth daily.   naphazoline-pheniramine (NAPHCON-A) 0.025-0.3 % ophthalmic solution Place 2 drops into both eyes 4 (four) times daily as needed for eye irritation or  allergies.   nitroGLYCERIN (NITROSTAT) 0.4 MG SL tablet Place 1 tablet (0.4 mg total) under the tongue every 5 (five) minutes x 3 doses as needed for chest pain.   phenol (CHLORASEPTIC) 1.4 % LIQD Use as directed 1 spray in the mouth or throat as needed for throat irritation / pain.   polyethylene glycol (MIRALAX / GLYCOLAX) 17 g packet Take 17 g by mouth daily. (Patient taking differently: Take 17 g by mouth as needed.)   PREVIDENT 5000 DRY MOUTH 1.1 % GEL dental gel Place 1 Application onto teeth at bedtime.   spironolactone (ALDACTONE) 25 MG tablet Take 25 mg by mouth daily. Per cardiology    Allergies: Patient is allergic to bee venom, betadine [povidone iodine], and doxycycline. Family History: Patient family history includes Alcohol abuse in his father; Anemia in his sister; Breast cancer in his sister; Cirrhosis in his father; Clotting disorder in his brother; Coronary artery disease in his brother and mother; Heart attack in his brother; Heart failure in his sister; Non-Hodgkin's lymphoma in his brother, brother, and sister; Other in his sister. Social History:  Patient  reports that he quit smoking about 39 years ago. His smoking use included cigarettes. He started smoking about 69 years ago. He has a 60 pack-year smoking history. He has never used smokeless tobacco. He reports that he does not currently use alcohol. He reports that he does not use drugs.  Review of Systems: Constitutional: Negative for fever malaise or anorexia Cardiovascular: negative for chest pain Respiratory: negative for SOB or persistent cough Gastrointestinal: negative for abdominal pain  Objective  Vitals: BP 128/68   Pulse 62   Temp 97.8 F (36.6 C)   Ht 6\' 1"  (1.854 m)   Wt 200 lb 3.2 oz (90.8 kg)   SpO2 92%   BMI 26.41 kg/m  General: no acute distress , A&Ox3 Left knee: Effusion present no warmth redness or abrasions.  Full range of motion.  Reports medial tenderness.  Commons side effects,  risks, benefits, and alternatives for medications and treatment plan prescribed today were discussed, and the patient expressed understanding of the given instructions. Patient is instructed to call or message via MyChart if he/she has any questions or concerns regarding our treatment plan. No barriers to understanding were identified. We discussed Red Flag symptoms and signs in detail. Patient expressed understanding regarding what to do in case of urgent or emergency type symptoms.  Medication list was reconciled, printed and provided to the patient in AVS. Patient instructions and summary information was reviewed with the patient as documented in the AVS. This note was prepared with assistance of Dragon voice recognition software. Occasional wrong-word or sound-a-like substitutions may have occurred due to the inherent limitations of voice recognition software

## 2022-12-30 ENCOUNTER — Ambulatory Visit: Payer: Medicare Other | Admitting: Family Medicine

## 2022-12-30 ENCOUNTER — Encounter: Payer: Self-pay | Admitting: Family Medicine

## 2022-12-30 ENCOUNTER — Other Ambulatory Visit: Payer: Self-pay

## 2022-12-30 ENCOUNTER — Ambulatory Visit (INDEPENDENT_AMBULATORY_CARE_PROVIDER_SITE_OTHER): Payer: Medicare Other

## 2022-12-30 VITALS — BP 140/80 | HR 64 | Ht 73.0 in | Wt 200.0 lb

## 2022-12-30 DIAGNOSIS — M25562 Pain in left knee: Secondary | ICD-10-CM | POA: Diagnosis not present

## 2022-12-30 DIAGNOSIS — G8929 Other chronic pain: Secondary | ICD-10-CM | POA: Diagnosis not present

## 2022-12-30 DIAGNOSIS — M129 Arthropathy, unspecified: Secondary | ICD-10-CM | POA: Diagnosis not present

## 2022-12-30 NOTE — Progress Notes (Signed)
I, Stevenson Clinch, CMA acting as a scribe for Clementeen Graham, MD.  Jose Ware is a 88 y.o. male who presents to [facility] at [facility] today for L knee pain ongoing since 10/14. Pt suffered a fall in the [place] parking lot. His L knee "popped" and gave out. Pt locates pain to jointline, medial, lateral, and posterior. Swelling present immediately at time of injury. Continues to have swelling. Ambulating with crutches today. Bruising present at medial aspect.   L Knee swelling: yes Mechanical symptoms: felt a pop at times of injury Aggravates: WB Treatments tried: Advil prn, was told not to take, Tylenol  Pertinent review of systems: No fevers or chills  Relevant historical information: Cardiomyopathy.   Exam:  BP (!) 140/80   Pulse 64   Ht 6\' 1"  (1.854 m)   Wt 200 lb (90.7 kg)   SpO2 95%   BMI 26.39 kg/m  General: Well Developed, well nourished, and in no acute distress.   MSK: Knee mature scar anterior medial knee with bossing medial joint line.  Joint effusion is present superior patellar space. Decreased range of motion left knee. Tender palpation medial joint line. Intact strength of extension.    Lab and Radiology Results  Procedure: Real-time Ultrasound Guided aspiration and injection left knee joint fluid Device: Philips Affiniti 50G/GE Logiq Images permanently stored and available for review in PACS Verbal informed consent obtained.  Discussed risks and benefits of procedure. Warned about infection, bleeding, hyperglycemia damage to structures among others. Patient expresses understanding and agreement Time-out conducted.   Noted no overlying erythema, induration, or other signs of local infection.   Skin prepped in a sterile fashion.   Local anesthesia: Topical Ethyl chloride.   With sterile technique and under real time ultrasound guidance: 3 mL of lidocaine injected into subcutaneous space achieving good anesthesia. Skin sterilized  with isopropyl alcohol and an 18-gauge needle used to access the joint fluid. 40 mL of clear straw-colored fluid aspirated. Syringe was exchanged and 40 mg of Kenalog and 2 mL of Marcaine were injected into the now decompressed knee joint. Completed without difficulty   Pain immediately resolved suggesting accurate placement of the medication.   Advised to call if fevers/chills, erythema, induration, drainage, or persistent bleeding.   Images permanently stored and available for review in the ultrasound unit.  Impression: Technically successful ultrasound guided injection. Of note some of the ultrasound images are incorrectly labeled right knee.  All ultrasound images are from the left knee.  X-ray images left knee obtained today personally and independently interpreted Severe medial compartment DJD.  Severe patellofemoral DJD.  Moderate lateral compartment DJD.  Calcification present throughout the knee joint thought to represent chondrocalcinosis. Await formal radiology review     Assessment and Plan: 88 y.o. male with exacerbation of chronic left knee pain.  Patient felt a pop with rotating of his knee.  I do not see a fracture on x-ray today.  Additionally his joint aspiration was not frank blood which makes fracture very unlikely.  I think his pain is due to exacerbation of underlying DJD.  Plan for aspiration and injection today and check back as needed.   PDMP not reviewed this encounter. Orders Placed This Encounter  Procedures   [place] LIMITED JOINT SPACE STRUCTURES LOW LEFT(NO LINKED CHARGES)    Order Specific Question:   Reason for Exam (SYMPTOM  OR DIAGNOSIS REQUIRED)    Answer:   left knee pain    Order Specific Question:  Preferred imaging location?    Answer:   [facility] Knee AP/LAT W/Sun[place] Left    Standing Status:   Future    Number of Occurrences:   1    Standing Expiration Date:   01/30/2023    Order Specific Question:   Reason for  Exam (SYMPTOM  OR DIAGNOSIS REQUIRED)    Answer:   left knee pain    Order Specific Question:   Preferred imaging location?    Answer:   [facility]   No orders of the defined types were placed in this encounter.    Discussed warning signs or symptoms. Please see discharge instructions. Patient expresses understanding.   The above documentation has been reviewed and is accurate and complete Clementeen Graham, M.D.

## 2022-12-30 NOTE — Patient Instructions (Signed)
Thank you for coming in today.   You received an injection today. Seek immediate medical attention if the joint becomes red, extremely painful, or is oozing fluid.   Please get an Xray today before you leave   Check back as needed 

## 2023-01-01 ENCOUNTER — Ambulatory Visit: Payer: Self-pay

## 2023-01-01 NOTE — Patient Outreach (Signed)
Care Coordination   Follow Up Visit Note   01/01/2023 Name: Jose Ware MRN: [id number] DOB: January 12, 1935  Jose Ware is a 88 y.o. year old male who sees Durene Cal, Aldine Contes, MD for primary care. I spoke with  Jose Ware by phone today.  What matters to the patients health and wellness today?  Getting over recent fall.  Seeing orthopedist.    Goals Addressed             This Visit's Progress    Heart Failure Management       Patient Goals/Self Care Activities: -Patient/Caregiver will self-administer medications as prescribed as evidenced by self-report/primary caregiver report  -Patient/Caregiver will attend all scheduled provider appointments as evidenced by clinician review of documented attendance to scheduled appointments and patient/caregiver report -Patient/Caregiver will call provider office for new concerns or questions as evidenced by review of documented incoming telephone call notes and patient report  -Weigh daily and record (notify MD with 3 lb weight gain over night or 5 lb in a week) -Follow CHF Action Plan -Adhere to low sodium diet Current weight 184 lbs      Patient reports doing better.  Recent fall about two weeks ago and twisted his knee. He reports he is off the crutches now.  Discussed fall prevention and pacing self with activities.   Weight 184 lbs.  Reviewed heart failure management and weight parameters.  He verbalized understanding and voices no concerns.        SDOH assessments and interventions completed:  Yes     Care Coordination Interventions:  Yes, provided   Follow up plan: Follow up call scheduled for November    Encounter Outcome:  Patient Visit Completed   Bary Leriche, RN, MSN [place]  [facility], [facility] Management Community Coordinator Direct Dial: [phone]  Fax: [phone] Website: [email or web address]

## 2023-01-01 NOTE — Patient Instructions (Signed)
Visit Information  Thank you for taking time to visit with me today. Please don't hesitate to contact me if I can be of assistance to you.   Following are the goals we discussed today:   Goals Addressed             This Visit's Progress    Heart Failure Management       Patient Goals/Self Care Activities: -Patient/Caregiver will self-administer medications as prescribed as evidenced by self-report/primary caregiver report  -Patient/Caregiver will attend all scheduled provider appointments as evidenced by clinician review of documented attendance to scheduled appointments and patient/caregiver report -Patient/Caregiver will call provider office for new concerns or questions as evidenced by review of documented incoming telephone call notes and patient report  -Weigh daily and record (notify MD with 3 lb weight gain over night or 5 lb in a week) -Follow CHF Action Plan -Adhere to low sodium diet Current weight 184 lbs      Patient reports doing better.  Recent fall about two weeks ago and twisted his knee. He reports he is off the crutches now.  Discussed fall prevention and pacing self with activities.   Weight 184 lbs.  Reviewed heart failure management and weight parameters.  He verbalized understanding and voices no concerns.        Our next appointment is by telephone on 01/29/23 at 1100 am  Please call the care guide team at [phone] if you need to cancel or reschedule your appointment.   If you are experiencing a Mental Health or Behavioral Health Crisis or need someone to talk to, please call the Suicide and Crisis Lifeline: 988   Patient verbalizes understanding of instructions and care plan provided today and agrees to view in MyChart. Active MyChart status and patient understanding of how to access instructions and care plan via MyChart confirmed with patient.     The patient has been provided with contact information for the care management team and has been advised  to call with any health related questions or concerns.   Bary Leriche, RN, MSN [facility], [facility] Management Community Coordinator Direct Dial: [phone]  Fax: [phone] Website: [email or web address]

## 2023-01-12 ENCOUNTER — Encounter: Payer: Self-pay | Admitting: Family Medicine

## 2023-01-12 ENCOUNTER — Ambulatory Visit (INDEPENDENT_AMBULATORY_CARE_PROVIDER_SITE_OTHER): Payer: Medicare Other | Admitting: Family Medicine

## 2023-01-12 VITALS — BP 118/60 | HR 65 | Temp 98.3°F | Ht 73.0 in | Wt 196.0 lb

## 2023-01-12 DIAGNOSIS — I5042 Chronic combined systolic (congestive) and diastolic (congestive) heart failure: Secondary | ICD-10-CM

## 2023-01-12 DIAGNOSIS — I10 Essential (primary) hypertension: Secondary | ICD-10-CM

## 2023-01-12 DIAGNOSIS — C8293 Follicular lymphoma, unspecified, intra-abdominal lymph nodes: Secondary | ICD-10-CM | POA: Diagnosis not present

## 2023-01-12 DIAGNOSIS — I2510 Atherosclerotic heart disease of native coronary artery without angina pectoris: Secondary | ICD-10-CM | POA: Diagnosis not present

## 2023-01-12 DIAGNOSIS — E785 Hyperlipidemia, unspecified: Secondary | ICD-10-CM

## 2023-01-12 NOTE — Progress Notes (Signed)
Phone [phone] In person visit   Subjective:   Jose Ware is a 88 y.o. year old very pleasant male patient who presents for/with See problem oriented charting Chief Complaint  Patient presents with   Medical Management of Chronic Issues   Hypertension   Fall    Pt states he fell in fall [employer] parking lot on 10/14.   Past Medical History-  Patient Active Problem List   Diagnosis Date Noted   Chronic heart failure (HCC) 05/31/2022    Priority: High   Bigeminy 05/31/2022    Priority: High   Follicular lymphoma, unspecified, intra-abdominal lymph nodes (HCC) 04/11/2021    Priority: High   History of small bowel obstruction 11/13/2020    Priority: High   Coronary artery disease involving native coronary artery of native heart without angina pectoris     Priority: High   Ischemic cardiomyopathy     Priority: High   History of embolic stroke     Priority: High   History of primary hyperparathyroidism s/p parathyroidectomy (benign adenoma) 12/28/2013    Priority: High   Fatigue 12/01/2013    Priority: High   History of prostate cancer 12/14/2008    Priority: High   OSA on CPAP 05/08/2020    Priority: Medium    Spinal stenosis of lumbar region at multiple levels 04/30/2017    Priority: Medium    Dyslipidemia 11/16/2015    Priority: Medium    Hx of adenomatous colonic polyps 01/18/2015    Priority: Medium    B12 deficiency 09/17/2010    Priority: Medium    PSORIASIS 10/15/2007    Priority: Medium    Essential hypertension 11/27/2006    Priority: Medium    Onychomycosis 10/28/2017    Priority: Low   Post herpetic neuralgia 06/20/2017    Priority: Low   Drug reaction 02/05/2017    Priority: Low   Senile purpura (HCC) 02/05/2017    Priority: Low   History of ventricular tachycardia 11/08/2015    Priority: Low   History of stomach cancer 12/01/2013    Priority: Low   Lower back pain 03/24/2013    Priority: Low   Bradycardia 09/23/2012    Priority:  Low   Hereditary and idiopathic peripheral neuropathy 10/18/2007    Priority: Low   Osteoarthritis 11/27/2006    Priority: Low   NEPHROLITHIASIS, HX OF 11/27/2006    Priority: Low   Pain of back and left lower extremity 06/08/2019    Priority: 1.   Left knee pain 06/06/2019    Priority: 1.   Spondylosis 06/25/2017    Priority: 1.   Post-traumatic osteoarthritis of right wrist 04/25/2015    Priority: 1.    Medications- reviewed and updated Current Outpatient Medications  Medication Sig Dispense Refill   amLODipine (NORVASC) 5 MG tablet Take 1 tablet by mouth once daily 90 tablet 0   aspirin EC 81 MG tablet Take 1 tablet (81 mg total) by mouth daily. 90 tablet 1   atorvastatin (LIPITOR) 10 MG tablet Take 1 tablet (10 mg total) by mouth every other day. 43 tablet 3   carvedilol (COREG) 3.125 MG tablet Take 1 tablet by mouth twice daily (Patient taking differently: Take 3.125 mg by mouth 2 (two) times daily.) 180 tablet 3   Cyanocobalamin (VITAMIN B-12) 5000 MCG TBDP Take 5,000 mcg by mouth every morning.     docusate sodium (COLACE) 100 MG capsule Take 100 mg by mouth 2 (two) times daily.     furosemide (LASIX)  20 MG tablet TAKE 1 TABLET BY MOUTH ONCE DAILY. TAKE 1 ADDITIONAL TABLET BY MOUTH FOR A WEIGHT GAIN OF 178 OR GREATER 60 tablet 5   Multiple Vitamins-Minerals (OCUVITE ADULT 50+ PO) Take 1 tablet by mouth daily.     naphazoline-pheniramine (NAPHCON-A) 0.025-0.3 % ophthalmic solution Place 2 drops into both eyes 4 (four) times daily as needed for eye irritation or allergies. 15 mL 0   nitroGLYCERIN (NITROSTAT) 0.4 MG SL tablet Place 1 tablet (0.4 mg total) under the tongue every 5 (five) minutes x 3 doses as needed for chest pain. 25 tablet 2   phenol (CHLORASEPTIC) 1.4 % LIQD Use as directed 1 spray in the mouth or throat as needed for throat irritation / pain. 20 mL 0   PREVIDENT 5000 DRY MOUTH 1.1 % GEL dental gel Place 1 Application onto teeth at bedtime.     spironolactone  (ALDACTONE) 25 MG tablet Take 25 mg by mouth daily. Per cardiology     acetaminophen (TYLENOL) 650 MG CR tablet Take 650 mg by mouth every 4 (four) hours as needed for pain.     polyethylene glycol (MIRALAX / GLYCOLAX) 17 g packet Take 17 g by mouth daily. (Patient taking differently: Take 17 g by mouth as needed.) 14 each 0   No current facility-administered medications for this visit.     Objective:  BP 118/60   Pulse 65   Temp 98.3 F (36.8 C)   Ht 6\' 1"  (1.854 m)   Wt 196 lb (88.9 kg)   SpO2 98%   BMI 25.86 kg/m  Gen: NAD, resting comfortably CV: RRR no murmurs rubs or gallops Lungs: CTAB no crackles, wheeze, rhonchi Ext: trace edema Left knee with joint line tenderness- appears to have effusion still but likely improved from previous based on prior notes Skin: warm, dry    Assessment and Plan   # Fall with resulting left knee pain S: Patient reports a fall in the [place] parking lot on October 14. He felt a pop coming down from the curb and next thing he knew had fallen onto the ground.  He had a left knee injury with effusion and was seen in our office on 12/29/2022 and sports medicine on 12/30/2022-thankfully no fracture was seen on x-ray.  He had a joint aspiration due to effusion.  Pain was thought related to worsening of underlying arthritis/flare of arthritis.  He received an injection as well for pain and found this very helpful A/P: ongoing pain- x-ray from 12/30/22 not yet read- my team is going to call radiology and then when Dr. Denyse Amass evaluates can determine next step- with level of pain I do think he is going to need a follow up sports medicine visit soon    #Non hodgkins lymphoma/follicular lymphoma- diagnosed 2022- monitoring with Dr. Leonides Schanz with last visit 08/06/22 and planned 6 month(s) follow up   # Ischemic LV dysfunction  # Congestive heart failure-hospitalization March 2024 with ejection fraction 40 to 45% and mild to moderate aortic valve  stenosis #Hypertension S: medication: spironolactone 25 mg, Coreg 3.125 mg twice daily, amlodipine 5 mg, lasix 20 mg daily with extra dose if weight or edema goes up- has not though -just had visit 12/24/22 with cardiology. 187 at home. Weight is up some after the fall but could be inactivity related. Trace edmea only. No shortness of breath.  A/P:  CHF overall stable- continue current medications   Hypertension - stable- continue current medicines  (improved on repeat- initial  elevation could have been from pain walking into building with left knee)   #Hyperlipidemia/CAD status post PCI after STEMI August 2017 S: Patient is compliant with atorvastatin 10 mg every other day -He is also compliant with aspirin 81mg - prior was on Plavix as well but had easy bruising/bleeding -occasional twinge a chest pain but no worsening pattern Lab Results  Component Value Date   CHOL 68 03/28/2022   HDL 20 (L) 03/28/2022   LDLCALC 37 03/28/2022   LDLDIRECT 33.0 06/25/2020   TRIG 56 03/28/2022   CHOLHDL 3.4 03/28/2022  A/P: lipids  at goal- too soon for repeat- continue current medications  Coronary artery disease largely asymptomatic - continue current medications - stable slight short term pain with activity that improves with rest (stable angina)   #OSA on CPAP-using consistently  Recommended follow up: Return in about 6 months (around 07/12/2023) for physical or sooner if needed.Schedule b4 you leave. Future Appointments  Date Time Provider Department Center  01/15/2023  1:45 PM [facility] ECHO 3 [id number]  01/26/2023  9:00 AM Glenford Bayley, NP [facility] None  01/29/2023 11:00 AM Fleeta Emmer, RN [facility] None  02/11/2023 10:00 AM [facility] None  02/11/2023 10:40 AM Jaci Standard, MD [facility] None  04/14/2023 10:20 AM [place], Peter M, MD [facility] None  06/01/2023 10:30 AM [facility] ANNUAL WELLNESS VISIT 1 [facility] P[facility]    Lab/Order associations: No  diagnosis found.  No orders of the defined types were placed in this encounter.   Return precautions advised.  Tana Conch, MD

## 2023-01-12 NOTE — Patient Instructions (Addendum)
ongoing pain- x-ray from 12/30/22 not yet read- my team is going to call radiology and then when Dr. Denyse Amass evaluates can determine next step- with level of pain I do think he is going to need a follow up sports medicine visit soon . Id call Dr. Denyse Amass if you don't hear back by Wednesday.   Other than your knee- you seem to be doing well- let me know if anything changes.   Recommended follow up: Return in about 6 months (around 07/12/2023) for physical or sooner if needed.Schedule b4 you leave.

## 2023-01-13 ENCOUNTER — Telehealth: Payer: Self-pay | Admitting: Gastroenterology

## 2023-01-13 NOTE — Telephone Encounter (Signed)
PT is calling to speak to a nurse as far as if a colonoscopy needs to be scheduled and if so what dates are available.

## 2023-01-13 NOTE — Progress Notes (Signed)
Left knee x-ray shows severe arthritis

## 2023-01-13 NOTE — Telephone Encounter (Signed)
Attempted to reach the pt by phone. No answer no voice mail.  Pt is not due until January. He will need an appt to discuss with a provider he chooses to see after Dr Georgia Dom.

## 2023-01-13 NOTE — Telephone Encounter (Signed)
Spoke with the pt and he has been advised that he should decide who he would like to establish care with.  He will review the website and call back to make an office visit to discuss colon. He does have some cardiac concerns that he is currently being worked up for. The pt has been advised of the information and verbalized understanding.

## 2023-01-15 ENCOUNTER — Ambulatory Visit (HOSPITAL_COMMUNITY): Payer: Medicare Other | Attending: Emergency Medicine

## 2023-01-15 DIAGNOSIS — I5042 Chronic combined systolic (congestive) and diastolic (congestive) heart failure: Secondary | ICD-10-CM | POA: Insufficient documentation

## 2023-01-15 LAB — ECHOCARDIOGRAM COMPLETE
AR max vel: 1.58 cm2
AV Area VTI: 1.57 cm2
AV Area mean vel: 1.62 cm2
AV Mean grad: 10 mm[Hg]
AV Peak grad: 19.3 mm[Hg]
Ao pk vel: 2.2 m/s
Area-P 1/2: 2.45 cm2
S' Lateral: 4.9 cm

## 2023-01-19 ENCOUNTER — Encounter: Payer: Self-pay | Admitting: Family Medicine

## 2023-01-26 ENCOUNTER — Ambulatory Visit: Payer: Medicare Other | Admitting: Primary Care

## 2023-01-29 ENCOUNTER — Ambulatory Visit: Payer: Self-pay

## 2023-01-29 NOTE — Patient Outreach (Signed)
  Care Coordination   Follow Up Visit Note   01/29/2023 Name: Jose Ware MRN: [id number] DOB: 02-Jun-1934  Jose Ware is a 88 y.o. year old male who sees Durene Cal, Aldine Contes, MD for primary care. I spoke with  Jose Ware by phone today.  What matters to the patients health and wellness today?  Heart failure management    Goals Addressed             This Visit's Progress    COMPLETED: Heart Failure Management       Patient Goals/Self Care Activities: -Patient/Caregiver will self-administer medications as prescribed as evidenced by self-report/primary caregiver report  -Patient/Caregiver will attend all scheduled provider appointments as evidenced by clinician review of documented attendance to scheduled appointments and patient/caregiver report -Patient/Caregiver will call provider office for new concerns or questions as evidenced by review of documented incoming telephone call notes and patient report  -Weigh daily and record (notify MD with 3 lb weight gain over night or 5 lb in a week) -Follow CHF Action Plan -Adhere to low sodium diet Current weight 184 lbs      Patient reports doing better.  Continues to recover from twisted knee. Weight continues at  184 lbs.  Reiterated heart failure management and weight parameters.  He verbalized understanding and voices no concerns.        SDOH assessments and interventions completed:  Yes     Care Coordination Interventions:  Yes, provided   Follow up plan: No further intervention required.   Encounter Outcome:  Patient Visit Completed   Bary Leriche, RN, MSN [facility]  [facility] Management Community Coordinator Direct Dial: [phone]  Fax: [phone] Website: [email or web address]

## 2023-01-29 NOTE — Patient Instructions (Signed)
Visit Information  Thank you for taking time to visit with me today. Please don't hesitate to contact me if I can be of assistance to you.   Following are the goals we discussed today:   Goals Addressed             This Visit's Progress    COMPLETED: Heart Failure Management       Patient Goals/Self Care Activities: -Patient/Caregiver will self-administer medications as prescribed as evidenced by self-report/primary caregiver report  -Patient/Caregiver will attend all scheduled provider appointments as evidenced by clinician review of documented attendance to scheduled appointments and patient/caregiver report -Patient/Caregiver will call provider office for new concerns or questions as evidenced by review of documented incoming telephone call notes and patient report  -Weigh daily and record (notify MD with 3 lb weight gain over night or 5 lb in a week) -Follow CHF Action Plan -Adhere to low sodium diet Current weight 184 lbs      Patient reports doing better.  Continues to recover from twisted knee. Weight continues at  184 lbs.  Reiterated heart failure management and weight parameters.  He verbalized understanding and voices no concerns.          If you are experiencing a Mental Health or Behavioral Health Crisis or need someone to talk to, please call the Suicide and Crisis Lifeline: 988   Patient verbalizes understanding of instructions and care plan provided today and agrees to view in MyChart. Active MyChart status and patient understanding of how to access instructions and care plan via MyChart confirmed with patient.     Face to Face appointment with care management team member scheduled for:  The patient has been provided with contact information for the care management team and has been advised to call with any health related questions or concerns.    Bary Leriche, RN, MSN [facility], [facility] Management Community  Coordinator Direct Dial: [phone]  Fax: [phone] Website: [email or web address]

## 2023-02-02 DIAGNOSIS — R310 Gross hematuria: Secondary | ICD-10-CM | POA: Diagnosis not present

## 2023-02-02 DIAGNOSIS — D414 Neoplasm of uncertain behavior of bladder: Secondary | ICD-10-CM | POA: Diagnosis not present

## 2023-02-03 ENCOUNTER — Telehealth: Payer: Self-pay | Admitting: Cardiology

## 2023-02-03 ENCOUNTER — Encounter: Payer: Medicare Other | Admitting: Pharmacist

## 2023-02-03 ENCOUNTER — Other Ambulatory Visit: Payer: Self-pay | Admitting: Urology

## 2023-02-03 NOTE — Telephone Encounter (Signed)
   Patient Name: Jose Ware  DOB: Oct 27, 1934 MRN: [id number]  Primary Cardiologist: Peter [place], MD  Chart reviewed as part of pre-operative protocol coverage. Pre-op clearance already addressed by colleagues in earlier phone notes. To summarize recommendations:  - Based on my last note he notes that he walks his neighborhood every day and had no complaints at the time.  His echocardiogram that was ordered was stable with no differences from his prior. He can complete over 4 METs  -Rise Paganini, NP  He is okay to hold aspirin 5 to 7 days prior to procedure.  Please resume a medically safe to do so.  Will route this bundled recommendation to requesting provider via Epic fax function and remove from pre-op pool. Please call with questions.  Sharlene Dory, PA-C 02/03/2023, 3:10 PM

## 2023-02-03 NOTE — Telephone Encounter (Signed)
   Pre-operative Risk Assessment    Patient Name: Jose Ware  DOB: Jul 16, 1934 MRN: [id number]      Request for Surgical Clearance    Procedure:   Bladder Biopsy TURBT  Date of Surgery:  Clearance 03/06/23                                 Surgeon:  Dr. Bertis Ruddy Surgeon's Group or Practice Name:  [facility] Phone number:  [phone] Fax number:  [phone]  4. What type of clearance is requested?  Medical or Cardiac Clearance only?  Pharmacy Clearance Only (Request is to hold medication only)?  Or Both?  Press F2 and select the clearance requested.  If both are needed, select both from the drop down list.     :1}  Type of Clearance Requested:   - Medical  - Pharmacy:  Hold Aspirin needs to know if he can hold the medication   Type of Anesthesia:  General    Additional requests/questions:   Patient  Signed, Tawni Millers   02/03/2023, 1:38 PM

## 2023-02-09 ENCOUNTER — Encounter: Payer: Self-pay | Admitting: Family Medicine

## 2023-02-09 ENCOUNTER — Encounter: Payer: Self-pay | Admitting: Hematology and Oncology

## 2023-02-09 ENCOUNTER — Encounter: Payer: Self-pay | Admitting: Cardiology

## 2023-02-11 ENCOUNTER — Other Ambulatory Visit: Payer: Self-pay | Admitting: Hematology and Oncology

## 2023-02-11 ENCOUNTER — Inpatient Hospital Stay: Payer: Medicare Other | Admitting: Hematology and Oncology

## 2023-02-11 ENCOUNTER — Other Ambulatory Visit: Payer: Self-pay | Admitting: Cardiology

## 2023-02-11 ENCOUNTER — Inpatient Hospital Stay: Payer: Medicare Other | Attending: Hematology and Oncology

## 2023-02-11 VITALS — BP 121/62 | HR 30 | Temp 97.7°F | Resp 18 | Ht 73.0 in | Wt 194.2 lb

## 2023-02-11 DIAGNOSIS — R590 Localized enlarged lymph nodes: Secondary | ICD-10-CM

## 2023-02-11 DIAGNOSIS — Z87891 Personal history of nicotine dependence: Secondary | ICD-10-CM | POA: Insufficient documentation

## 2023-02-11 DIAGNOSIS — C8293 Follicular lymphoma, unspecified, intra-abdominal lymph nodes: Secondary | ICD-10-CM | POA: Diagnosis not present

## 2023-02-11 DIAGNOSIS — R001 Bradycardia, unspecified: Secondary | ICD-10-CM | POA: Diagnosis not present

## 2023-02-11 DIAGNOSIS — Z803 Family history of malignant neoplasm of breast: Secondary | ICD-10-CM | POA: Insufficient documentation

## 2023-02-11 DIAGNOSIS — C8213 Follicular lymphoma grade II, intra-abdominal lymph nodes: Secondary | ICD-10-CM | POA: Diagnosis not present

## 2023-02-11 DIAGNOSIS — I700 Atherosclerosis of aorta: Secondary | ICD-10-CM | POA: Diagnosis not present

## 2023-02-11 DIAGNOSIS — Z807 Family history of other malignant neoplasms of lymphoid, hematopoietic and related tissues: Secondary | ICD-10-CM | POA: Diagnosis not present

## 2023-02-11 LAB — CBC WITH DIFFERENTIAL (CANCER CENTER ONLY)
Abs Immature Granulocytes: 0.02 10*3/uL (ref 0.00–0.07)
Basophils Absolute: 0 10*3/uL (ref 0.0–0.1)
Basophils Relative: 1 %
Eosinophils Absolute: 0.1 10*3/uL (ref 0.0–0.5)
Eosinophils Relative: 1 %
HCT: 37.4 % — ABNORMAL LOW (ref 39.0–52.0)
Hemoglobin: 12.3 g/dL — ABNORMAL LOW (ref 13.0–17.0)
Immature Granulocytes: 0 %
Lymphocytes Relative: 10 %
Lymphs Abs: 0.6 10*3/uL — ABNORMAL LOW (ref 0.7–4.0)
MCH: 32.9 pg (ref 26.0–34.0)
MCHC: 32.9 g/dL (ref 30.0–36.0)
MCV: 100 fL (ref 80.0–100.0)
Monocytes Absolute: 0.7 10*3/uL (ref 0.1–1.0)
Monocytes Relative: 11 %
Neutro Abs: 5.1 10*3/uL (ref 1.7–7.7)
Neutrophils Relative %: 77 %
Platelet Count: 204 10*3/uL (ref 150–400)
RBC: 3.74 MIL/uL — ABNORMAL LOW (ref 4.22–5.81)
RDW: 14 % (ref 11.5–15.5)
WBC Count: 6.6 10*3/uL (ref 4.0–10.5)
nRBC: 0 % (ref 0.0–0.2)

## 2023-02-11 LAB — CMP (CANCER CENTER ONLY)
ALT: 9 U/L (ref 0–44)
AST: 11 U/L — ABNORMAL LOW (ref 15–41)
Albumin: 3.7 g/dL (ref 3.5–5.0)
Alkaline Phosphatase: 65 U/L (ref 38–126)
Anion gap: 5 (ref 5–15)
BUN: 32 mg/dL — ABNORMAL HIGH (ref 8–23)
CO2: 32 mmol/L (ref 22–32)
Calcium: 9.2 mg/dL (ref 8.9–10.3)
Chloride: 105 mmol/L (ref 98–111)
Creatinine: 1.17 mg/dL (ref 0.61–1.24)
GFR, Estimated: >60 mL/min (ref >60)
Glucose, Bld: 107 mg/dL — ABNORMAL HIGH (ref 70–99)
Potassium: 3.9 mmol/L (ref 3.5–5.1)
Sodium: 142 mmol/L (ref 135–145)
Total Bilirubin: 1 mg/dL (ref <1.2)
Total Protein: 6.2 g/dL — ABNORMAL LOW (ref 6.5–8.1)

## 2023-02-11 LAB — LACTATE DEHYDROGENASE: LDH: 135 U/L (ref 98–192)

## 2023-02-11 NOTE — Progress Notes (Signed)
[facility] [phone]   Fax:([phone]  PROGRESS NOTE  Patient Care Team: Shelva Majestic, MD as PCP - General (Family Medicine) [place], Peter M, MD as PCP - Cardiology (Cardiology) Su Grand, MD (Inactive) (Urology) Meryl Dare, MD (Gastroenterology) [place], Peter M, MD as Consulting Physician (Cardiology) Marzella Schlein., MD as Consulting Physician (Ophthalmology) Jerilee Field, MD as Consulting Physician (Urology) Erroll Luna, [facility] (Inactive) as Pharmacist (Pharmacist)  Hematological/Oncological History # Follicular Lymphoma Stage I-II 12/04/2020: CT C/A/P shows periaortic adenopathy in the upper abdomen. No evidence of disease in the chest 12/17/2020: biopsy of lymph node shows result consistent with follicular lymphoma. 01/09/2021: transition care to Dr. Leonides Schanz  07/17/2021: CT C/A/P showed slight progression of disease as demonstrated by slight interval enlargement of several retroperitoneal lymph nodes and a mildly enlarged low middle mediastinal lymph node.  Interval History:  Jose Ware 88 y.o. male with medical history significant for newly diagnosed early stage follicular lymphoma who presents for a follow up visit. The patient's last visit was on 08/06/2022 . In the interim since the last visit he has had no changes in his health.   On exam today Mr. Hemmerling reports he has been well overall in the interim since her last visit 6 months ago.  He reports he has not noticed any enlarged lymph nodes.  He reports he does have "good days and bad days".  He notes he does have some occasional difficulty with his balance.  He also notes he has an upcoming procedure for removal of polyps in his bladder.  He notes he is not having any runny nose, sore throat, cough.  He did have a measurement of a bradycardia with heart rate of 30, but EKG showed to be closer to 55.  He is not having any lightheadedness, dizziness, shortness of breath.   He reports his energy today is about an 8 out of 10 but usually he does "sleep a lot".  He reports that he cannot stand for long periods of time because of pain in his legs and back.  He reports his weight has been steadily increasing after he had a large drop this year.  He notes that he otherwise is not having any fevers, chills, sweats, nausea, vomiting or diarrhea.  Full 10 point ROS is listed below.  MEDICAL HISTORY:  Past Medical History:  Diagnosis Date   Adenocarcinoma of prostate [facility])    XRT & Radiation seed implantation in 2010   Adenomatous colon polyp    CAD (coronary artery disease)    a. 11/04/15:  Acute inferolateral STEMI: S/p emergent DES of a very large LCx with extensive thrombus. Significant residual disease in the proximal LAD and distal RCA. S/p staged PCI of LAD and RCA 8/29.   Cataract    CVA (cerebral infarction)    a. 10/2015: embolic CVA after heart cath    CVA (cerebral infarction)    a. 10/2015: embolic CVA after heart cath    DIVERTICULITIS, HX OF 11/27/2006        DJD (degenerative joint disease)    wrist - R    Hernia    History of blood transfusion 1985   with colon surgery   History of kidney stones    Hypertension    Ischemic cardiomyopathy    a. EF 25-30% by LV gram on cath 11/04/15. Appeared out of proportion to infarct although infarct was large. EF improved by Echo and now 40-45%.   Myocardial  infarction (HCC) 2019   NEPHROLITHIASIS, HX OF 11/27/2006         Non Hodgkin's lymphoma (HCC) 11/2020   watching at this point, in abdomen, no meds or chemo yet as of 01-27-2022   Peripheral neuropathy    pt denies   Pneumonia    as a child   Psoriasis    Sleep apnea    wears cpap   Stroke [facility])    no residuals   Tenosynovitis     SURGICAL HISTORY: Past Surgical History:  Procedure Laterality Date   APPENDECTOMY  03/11/1983   BACK SURGERY  03/10/1978   minor disk surgery: instrumentation placed and removed in a second procedure   CARDIAC  CATHETERIZATION N/A 11/04/2015   Procedure: Left Heart Cath and Coronary Angiography;  Surgeon: Peter M Swaziland, MD;  Location: [facility] INVASIVE CV LAB;  Service: Cardiovascular;  Laterality: N/A;   CARDIAC CATHETERIZATION N/A 11/04/2015   Procedure: Coronary Stent Intervention;  Surgeon: Peter M [place]azil[place], MD;  Location: [facility] INVASIVE CV LAB;  Service: Cardiovascular;  Laterality: N/A;   CARDIAC CATHETERIZATION N/A 11/06/2015   Procedure: Coronary Stent Intervention;  Surgeon: Marykay Lex, MD;  Location: [facility] INVASIVE CV LAB;  Service: Cardiovascular;  Laterality: N/A;   CATARACT EXTRACTION, BILATERAL  03/11/2011   CHOLECYSTECTOMY  03/10/1984   COLON SURGERY  11/09/1983   pt. had ileus, had colon surgery, requiring colostomy & then reversal & then dehisence of that wound & return to OR for repair & cholecystectomy     COLONOSCOPY  2019   COLONOSCOPY WITH PROPOFOL N/A 03/13/2022   Procedure: COLONOSCOPY WITH PROPOFOL;  Surgeon: Meryl Dare, MD;  Location: [facility];  Service: Gastroenterology;  Laterality: N/A;   COLOSTOMY  03/11/1983   After colectomy for diverticulitis, pt. remarks during this surgery they "gave me the paddles two times  because of bleeding", pt. states it was unrelated to any anesthesia complication   EYE SURGERY     /w IOL   KNEE ARTHROSCOPY Left 03/10/2001   KNEE SURGERY Left 03/10/1954   Left 1956, Right w/cartilage removed later   PARATHYROIDECTOMY N/A 05/12/2014   Procedure: PARATHYROIDECTOMY;  Surgeon: Darnell Level, MD;  Location: [facility] OR;  Service: General;  Laterality: N/A;   POLYPECTOMY     POLYPECTOMY  03/13/2022   Procedure: POLYPECTOMY;  Surgeon: Meryl Dare, MD;  Location: [facility];  Service: Gastroenterology;;   REVERSAL OF COLOSTOMY  03/10/1985   TONSILLECTOMY  03/10/1944   WRIST SURGERY Left    Remote complicated w/infection    SOCIAL HISTORY: Social History   Socioeconomic History   Marital status: Married    Spouse name: Not on file   Number  of children: 3   Years of education: Not on file   Highest education level: Bachelor's degree (e.g., BA, AB, BS)  Occupational History   Not on file  Tobacco Use   Smoking status: Former    Current packs/day: 0.00    Average packs/day: 2.0 packs/day for 30.0 years (60.0 ttl pk-yrs)    Types: Cigarettes    Start date: 03/10/1953    Quit date: 03/11/1983    Years since quitting: 39.9   Smokeless tobacco: Never   Tobacco comments:    started smoking in the service; ICU 85' part of his stomach; appendix; coma   Vaping Use   Vaping status: Never Used  Substance and Sexual Activity   Alcohol use: Not Currently    Comment: RARE, maybe one drink a couple times a  year   Drug use: Never   Sexual activity: Not Currently  Other Topics Concern   Not on file  Social History Narrative   [employer] [employer] [facility] [place], [place].Married 45 - Divorced 03/13/1995; remarried '03 different wife3 sons - '63, '65, '67Grandchildren -14; 1 great grand daughter; 4 great grands on wife's side, 1 great great granddaughter      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      Hobbies: fishing, busy volunteering   Social Determinants of Health   Financial Resource Strain: Low Risk  (01/08/2023)   Overall Financial Resource Strain (CARDIA)    Difficulty of Paying Living Expenses: Not very hard  Food Insecurity: No Food Insecurity (01/08/2023)   Hunger Vital Sign    Worried About Running Out of Food in the Last Year: Never true    Ran Out of Food in the Last Year: Never true  Transportation Needs: No Transportation Needs (01/08/2023)   PRAPARE - Administrator, Civil Service (Medical): No    Lack of Transportation (Non-Medical): No  Physical Activity: Inactive (01/08/2023)   Exercise Vital Sign    Days of Exercise per Week: 0 days    Minutes of Exercise per Session: 10 min  Stress: No Stress Concern Present (01/08/2023)   [employer] of Occupational Health - Occupational Stress  Questionnaire    Feeling of Stress : Only a little  Social Connections: Moderately Integrated (01/08/2023)   Social Connection and Isolation Panel [NHANES]    Frequency of Communication with Friends and Family: Once a week    Frequency of Social Gatherings with Friends and Family: Once a week    Attends Religious Services: More than 4 times per year    Active Member of [employer] or Organizations: Yes    Attends Engineer, structural: More than 4 times per year    Marital Status: Married  Catering manager Violence: Not At Risk (05/31/2022)   Humiliation, Afraid, Rape, and Kick questionnaire    Fear of Current or Ex-Partner: No    Emotionally Abused: No    Physically Abused: No    Sexually Abused: No    FAMILY HISTORY: Family History  Problem Relation Age of Onset   Coronary artery disease Mother    Alcohol abuse Father    Cirrhosis Father    Heart failure Sister    Breast cancer Sister        W/involvement of right arm leading to amputation   Anemia Sister        related to treatment to lymphoma   Non-Hodgkin's lymphoma Sister    Other Sister        died from covid 03/12/2021   Coronary artery disease Brother    Heart attack Brother    Non-Hodgkin's lymphoma Brother    Clotting disorder Brother    Non-Hodgkin's lymphoma Brother    Prostate cancer Neg Hx    Colon cancer Neg Hx    Diabetes Neg Hx    Glaucoma Neg Hx    Rectal cancer Neg Hx    Esophageal cancer Neg Hx    Colon polyps Neg Hx    Stomach cancer Neg Hx     ALLERGIES:  is allergic to bee venom, betadine [povidone iodine], and doxycycline.  MEDICATIONS:  Current Outpatient Medications  Medication Sig Dispense Refill   amLODipine (NORVASC) 5 MG tablet Take 1 tablet by mouth once daily 90 tablet 0   aspirin EC 81 MG tablet Take 1  tablet (81 mg total) by mouth daily. 90 tablet 1   carvedilol (COREG) 3.125 MG tablet Take 1 tablet by mouth twice daily (Patient taking differently: Take 3.125 mg by mouth 2 (two)  times daily.) 180 tablet 3   Cyanocobalamin (VITAMIN B-12) 5000 MCG TBDP Take 5,000 mcg by mouth every morning.     docusate sodium (COLACE) 100 MG capsule Take 100 mg by mouth 2 (two) times daily.     furosemide (LASIX) 20 MG tablet TAKE 1 TABLET BY MOUTH ONCE DAILY. TAKE 1 ADDITIONAL TABLET BY MOUTH FOR A WEIGHT GAIN OF 178 OR GREATER 60 tablet 5   Multiple Vitamins-Minerals (OCUVITE ADULT 50+ PO) Take 1 tablet by mouth daily.     nitroGLYCERIN (NITROSTAT) 0.4 MG SL tablet Place 1 tablet (0.4 mg total) under the tongue every 5 (five) minutes x 3 doses as needed for chest pain. 25 tablet 2   spironolactone (ALDACTONE) 25 MG tablet Take 25 mg by mouth daily. Per cardiology     No current facility-administered medications for this visit.    REVIEW OF SYSTEMS:   Constitutional: ( - ) fevers, ( - )  chills , ( - ) night sweats Eyes: ( - ) blurriness of vision, ( - ) double vision, ( - ) watery eyes Ears, nose, mouth, throat, and face: ( - ) mucositis, ( - ) sore throat Respiratory: ( - ) cough, ( - ) dyspnea, ( - ) wheezes Cardiovascular: ( - ) palpitation, ( - ) chest discomfort, ( - ) lower extremity swelling Gastrointestinal:  ( - ) nausea, ( - ) heartburn, ( - ) change in bowel habits Skin: ( - ) abnormal skin rashes Lymphatics: ( - ) new lymphadenopathy, ( - ) easy bruising Neurological: ( - ) numbness, ( - ) tingling, ( - ) new weaknesses Behavioral/Psych: ( - ) mood change, ( - ) new changes  All other systems were reviewed with the patient and are negative.  PHYSICAL EXAMINATION: ECOG PERFORMANCE STATUS: 0 - Asymptomatic  Vitals:   02/11/23 1039  BP: 121/62  Pulse: (!) 30  Resp: 18  Temp: 97.7 F (36.5 C)  SpO2: 97%     Filed Weights   02/11/23 1039  Weight: 194 lb 4 oz (88.1 kg)      GENERAL: Well-appearing elderly Caucasian male, alert, no distress and comfortable SKIN: skin color, texture, turgor are normal, no rashes or significant lesions EYES: conjunctiva are  pink and non-injected, sclera clear LUNGS: clear to auscultation and percussion with normal breathing effort HEART: regular rate & rhythm and no murmurs and no lower extremity edema Musculoskeletal: no cyanosis of digits and no clubbing  PSYCH: alert & oriented x 3, fluent speech NEURO: no focal motor/sensory deficits  LABORATORY DATA:  I have reviewed the data as listed    Latest Ref Rng & Units 02/11/2023   10:05 AM 09/21/2022    7:03 PM 08/06/2022   10:43 AM  CBC  WBC 4.0 - 10.5 K/uL 6.6  8.0  7.7   Hemoglobin 13.0 - 17.0 g/dL 86.5  78.4  69.6   Hematocrit 39.0 - 52.0 % 37.4  34.7  37.0   Platelets 150 - 400 K/uL 204  181  207        Latest Ref Rng & Units 02/11/2023   10:05 AM 09/21/2022    7:03 PM 08/06/2022   10:43 AM  CMP  Glucose 70 - 99 mg/dL 295  284  91   BUN 8 -  23 mg/dL 32  38  39   Creatinine 0.61 - 1.24 mg/dL 0.16  0.10  9.32   Sodium 135 - 145 mmol/L 142  139  141   Potassium 3.5 - 5.1 mmol/L 3.9  3.5  4.3   Chloride 98 - 111 mmol/L 105  102  102   CO2 22 - 32 mmol/L 32  28  33   Calcium 8.9 - 10.3 mg/dL 9.2  8.6  9.1   Total Protein 6.5 - 8.1 g/dL 6.2   7.0   Total Bilirubin <1.2 mg/dL 1.0   0.9   Alkaline Phos 38 - 126 U/L 65   72   AST 15 - 41 U/L 11   13   ALT 0 - 44 U/L 9   10    RADIOGRAPHIC STUDIES: ECHOCARDIOGRAM COMPLETE  Result Date: 01/15/2023    ECHOCARDIOGRAM REPORT   Patient Name:   SILER MONTEMAYOR [facility] Date of Exam: 01/15/2023 Medical Rec #:  [id number]          Height:       73.0 in Accession #:    [id number]         Weight:       196.0 lb Date of Birth:  01-Feb-1935          BSA:          2.133 m Patient Age:    88 years           BP:           114/60 mmHg Patient Gender: M                  HR:           61 bpm. Exam Location:  [address] Procedure: 2D Echo, Cardiac Doppler, Color Doppler, 3D Echo and Strain Analysis Indications:    I50.42 CHF  History:        Patient has prior history of Echocardiogram examinations, most                 recent  06/01/2022. CHF, CAD and Previous Myocardial Infarction,                 Stroke, Arrythmias:PVC; Risk Factors:Hypertension and                 Dyslipidemia.  Sonographer:    Samule Ohm RDCS Referring Phys: [id number] [facility] IMPRESSIONS  1. Left ventricular ejection fraction, by estimation, is 40 to 45%. Left ventricular ejection fraction by 3D volume is 45 %. The left ventricle has mildly decreased function. The left ventricle demonstrates regional wall motion abnormalities (see scoring diagram/findings for description). The left ventricular internal cavity size was moderately dilated. There is mild left ventricular hypertrophy. Left ventricular diastolic parameters are consistent with Grade I diastolic dysfunction (impaired relaxation). Elevated left ventricular end-diastolic pressure. The E/e' is 16. There is severe akinesis of the left ventricular, mid-apical inferior wall and inferoseptal wall.  2. Right ventricular systolic function is normal. The right ventricular size is normal. There is mildly elevated pulmonary artery systolic pressure.  3. Left atrial size was severely dilated.  4. The mitral valve is abnormal. Mild to moderate mitral valve regurgitation.  5. The aortic valve is tricuspid. Aortic valve regurgitation is trivial. Mild aortic valve stenosis. Aortic valve area, by VTI measures 1.57 cm. Aortic valve mean gradient measures 10.0 mmHg. Aortic valve Vmax measures 2.20 m/s.  6. Aortic dilatation noted. There is mild dilatation of  the ascending aorta, measuring 41 mm.  7. The inferior vena cava is normal in size with greater than 50% respiratory variability, suggesting right atrial pressure of 3 mmHg. Comparison(s): No significant change from prior study. 06/01/2022: LVEF 40-45%, inferior distal septal and apical hypokinesis. FINDINGS  Left Ventricle: Left ventricular ejection fraction, by estimation, is 40 to 45%. Left ventricular ejection fraction by 3D volume is 45 %. The left  ventricle has mildly decreased function. The left ventricle demonstrates regional wall motion abnormalities. Severe akinesis of the left ventricular, mid-apical inferior wall and inferoseptal wall. The left ventricular internal cavity size was moderately dilated. There is mild left ventricular hypertrophy. Left ventricular diastolic parameters are consistent with Grade I diastolic dysfunction (impaired relaxation). Elevated left ventricular end-diastolic pressure. The E/e' is 16. Right Ventricle: The right ventricular size is normal. No increase in right ventricular wall thickness. Right ventricular systolic function is normal. There is mildly elevated pulmonary artery systolic pressure. The tricuspid regurgitant velocity is 3.16  m/s, and with an assumed right atrial pressure of 3 mmHg, the estimated right ventricular systolic pressure is 42.9 mmHg. Left Atrium: Left atrial size was severely dilated. Right Atrium: Right atrial size was normal in size. Pericardium: There is no evidence of pericardial effusion. Mitral Valve: The mitral valve is abnormal. There is mild calcification of the anterior and posterior mitral valve leaflet(s). Mild mitral annular calcification. Mild to moderate mitral valve regurgitation, with centrally-directed jet. Tricuspid Valve: The tricuspid valve is grossly normal. Tricuspid valve regurgitation is trivial. Aortic Valve: The aortic valve is tricuspid. Aortic valve regurgitation is trivial. Mild aortic stenosis is present. Aortic valve mean gradient measures 10.0 mmHg. Aortic valve peak gradient measures 19.3 mmHg. Aortic valve area, by VTI measures 1.57 cm. Pulmonic Valve: The pulmonic valve was normal in structure. Pulmonic valve regurgitation is not visualized. Aorta: Aortic dilatation noted. There is mild dilatation of the ascending aorta, measuring 41 mm. Venous: The inferior vena cava is normal in size with greater than 50% respiratory variability, suggesting right atrial  pressure of 3 mmHg. IAS/Shunts: No atrial level shunt detected by color flow Doppler.  LEFT VENTRICLE PLAX 2D LVIDd:         6.10 cm         Diastology LVIDs:         4.90 cm         LV e' medial:    6.31 cm/s LV PW:         1.20 cm         LV E/e' medial:  16.3 LV IVS:        1.30 cm         LV e' lateral:   6.64 cm/s LVOT diam:     2.40 cm         LV E/e' lateral: 15.5 LV SV:         80 LV SV Index:   37 LVOT Area:     4.52 cm        3D Volume EF                                LV 3D EF:    Left  ventricul                                             ar                                             ejection                                             fraction                                             by 3D                                             volume is                                             45 %.                                 3D Volume EF:                                3D EF:        45 %                                LV EDV:       254 ml                                LV ESV:       141 ml                                LV SV:        114 ml RIGHT VENTRICLE             IVC RV S prime:     20.40 cm/s  IVC diam: 1.20 cm TAPSE (M-mode): 2.8 cm RVSP:           42.9 mmHg LEFT ATRIUM              Index        RIGHT ATRIUM           Index LA diam:        5.40 cm  2.53 cm/m   RA Pressure: 3.00 mmHg LA Vol (A2C):   107.0 ml 50.16 ml/m  RA Area:     15.90 cm LA Vol (A4C):  104.0 ml 48.75 ml/m  RA Volume:   43.60 ml  20.44 ml/m LA Biplane Vol: 110.0 ml 51.56 ml/m  AORTIC VALVE AV Area (Vmax):    1.58 cm AV Area (Vmean):   1.62 cm AV Area (VTI):     1.57 cm AV Vmax:           219.50 cm/s AV Vmean:          150.500 cm/s AV VTI:            0.508 m AV Peak Grad:      19.3 mmHg AV Mean Grad:      10.0 mmHg LVOT Vmax:         76.60 cm/s LVOT Vmean:        53.900 cm/s LVOT VTI:          0.176 m LVOT/AV VTI ratio: 0.35  AORTA Ao Root diam: 3.70 cm Ao Asc diam:  4.10  cm MITRAL VALVE                TRICUSPID VALVE MV Area (PHT): 2.45 cm     TR Peak grad:   39.9 mmHg MV Decel Time: 310 msec     TR Vmax:        316.00 cm/s MV E velocity: 103.00 cm/s  Estimated RAP:  3.00 mmHg MV A velocity: 117.00 cm/s  RVSP:           42.9 mmHg MV E/A ratio:  0.88                             SHUNTS                             Systemic VTI:  0.18 m                             Systemic Diam: 2.40 cm Zoila Shutter MD Electronically signed by Zoila Shutter MD Signature Date/Time: 01/15/2023/2:56:31 PM    Final     ASSESSMENT & PLAN Jose Ware 88 y.o. male with medical history significant for newly diagnosed early stage follicular lymphoma who presents for a follow up visit.   Previously we discussed the diagnosis of follicular lymphoma.  Follicular lymphoma tends to be a more indolent lymphoma and therefore treatment is not immediately required.  We discussed the criteria the patient have to meet in order to begin treatment.  This included the GELF criteria.  As the patient is currently low stage and does not meet any of these criteria I would recommend observation at this time.  The patient and his wife were in agreement with this plan moving forward.  FLIPI Score: Low Risk (Age >60). 10 year OS is 70%  # Follicular Lymphoma Stage I-II -- Findings are consistent with a low-grade lymphoma, most likely follicular lymphoma.  At this time disease appears quite limited in stage --No clear indication to start treatment at this time as patient does not meet any GELF Criteria --given the abdominal location and possible mediastinal involvement would recommend against local therapy.  -- Labs shows white blood cell count 6.6, hemoglobin 12.3, MCV 100, platelets 204 --Plan for a return to clinic in 12 months time with CT scan as clinically indicated.   #Bradycardia -- Our equipment measured his heart rate at 30, however EKG showed a heart rate  to be 55. -- Continue to monitor.  Strict  return precautions for lightheadedness, dizziness, palpitations or shortness of breath.  # Aortic Calcification -- Incidentally noted on a CT scan --No abnormalities on physical examination, no murmur heard --echocardiogram shows mild restriction.   No orders of the defined types were placed in this encounter.  All questions were answered. The patient knows to call the clinic with any problems, questions or concerns.  A total of more than 30 minutes were spent on this encounter with face-to-face time and non-face-to-face time, including preparing to see the patient, ordering tests and/or medications, counseling the patient and coordination of care as outlined above.   Ulysees Barns, MD Department of Hematology/Oncology [facility] at [facility] Phone: [phone] Pager: [phone] Email: [email or web address]  02/11/2023 2:31 PM

## 2023-02-12 NOTE — Progress Notes (Addendum)
COVID Vaccine Completed: yes  Date of COVID positive in last 90 days: no  PCP - Tana Conch, MD Cardiologist - Peter [place], MD  Cardiac clearance by Jari Favre, PA 02/03/23 in Epic  Chest x-ray - 05/31/22 Epic EKG - 07/14/22 Epic Stress Test - been awhile per pt ECHO - 01/15/23 Epic Cardiac Cath - 11/04/15 Epic Pacemaker/ICD device last checked: n/a Spinal Cord Stimulator: n/a  Bowel Prep - no  Sleep Study - yes CPAP - yes every night   Fasting Blood Sugar - n/a Checks Blood Sugar _____ times a day  Last dose of GLP1 agonist-  N/A GLP1 instructions:  Hold 7 days before surgery    Last dose of SGLT-2 inhibitors-  N/A SGLT-2 instructions:  Hold 3 days before surgery    Blood Thinner Instructions:  Time Aspirin Instructions: ASA 81, hold 5-7 days Last Dose:  Activity level: Can go up a flight of stairs and perform activities of daily living without stopping and without symptoms of chest pain or shortness of breath. Slow with stairs, does well with cane  Anesthesia review: HTN, CAD, CHF, OSA, cardiomyopathy, embolic stroke, STEMI  Patient denies shortness of breath, fever, cough and chest pain at PAT appointment  Patient verbalized understanding of instructions that were given to them at the PAT appointment. Patient was also instructed that they will need to review over the PAT instructions again at home before surgery.

## 2023-02-12 NOTE — Patient Instructions (Addendum)
SURGICAL WAITING ROOM VISITATION  Patients having surgery or a procedure may have no more than 2 support people in the waiting area - these visitors may rotate.    Children under the age of 95 must have an adult with them who is not the patient.  Due to an increase in RSV and influenza rates and associated hospitalizations, children ages 48 and under may not visit patients in [facility] hospitals.  If the patient needs to stay at the hospital during part of their recovery, the visitor guidelines for inpatient rooms apply. Pre-op nurse will coordinate an appropriate time for 1 support person to accompany patient in pre-op.  This support person may not rotate.    Please refer to the [facility] website for the visitor guidelines for Inpatients (after your surgery is over and you are in a regular room).    Your procedure is scheduled on: 03/06/23   Report to [facility] Main Entrance    Report to admitting at 8:15 AM   Call this number if you have problems the morning of surgery [phone]   Do not eat food or drink liquids :After Midnight.          If you have questions, please contact your surgeon's office.   FOLLOW BOWEL PREP AND ANY ADDITIONAL PRE OP INSTRUCTIONS YOU RECEIVED FROM YOUR SURGEON'S OFFICE!!!     Oral Hygiene is also important to reduce your risk of infection.                                    Remember - BRUSH YOUR TEETH THE MORNING OF SURGERY WITH YOUR REGULAR TOOTHPASTE  DENTURES WILL BE REMOVED PRIOR TO SURGERY PLEASE DO NOT APPLY "Poly grip" OR ADHESIVES!!!   Stop all vitamins and herbal supplements 7 days before surgery.   Take these medicines the morning of surgery with A SIP OF WATER: Amlodipine, Carvedilol, Tylenol  Bring CPAP mask and tubing day of surgery.                              You may not have any metal on your body including jewelry, and body piercing             Do not wear lotions, powders, cologne, or deodorant               Men may shave face and neck.   Do not bring valuables to the hospital. [facility] IS NOT             RESPONSIBLE   FOR VALUABLES.   Contacts, glasses, dentures or bridgework may not be worn into surgery.  DO NOT BRING YOUR HOME MEDICATIONS TO THE HOSPITAL. PHARMACY WILL DISPENSE MEDICATIONS LISTED ON YOUR MEDICATION LIST TO YOU DURING YOUR ADMISSION IN THE HOSPITAL!    Patients discharged on the day of surgery will not be allowed to drive home.  Someone NEEDS to stay with you for the first 24 hours after anesthesia.              Please read over the following fact sheets you were given: IF YOU HAVE QUESTIONS ABOUT YOUR PRE-OP INSTRUCTIONS PLEASE CALL [phone] Contras    If you received a COVID test during your pre-op visit  it is requested that you wear a mask when out in public, stay away from anyone that  may not be feeling well and notify your surgeon if you develop symptoms. If you test positive for Covid or have been in contact with anyone that has tested positive in the last 10 days please notify you surgeon.    [facility] - Preparing for Surgery Before surgery, you can play an important role.  Because skin is not sterile, your skin needs to be as free of germs as possible.  You can reduce the number of germs on your skin by washing with CHG (chlorahexidine gluconate) soap before surgery.  CHG is an antiseptic cleaner which kills germs and bonds with the skin to continue killing germs even after washing. Please DO NOT use if you have an allergy to CHG or antibacterial soaps.  If your skin becomes reddened/irritated stop using the CHG and inform your nurse when you arrive at Short Stay. Do not shave (including legs and underarms) for at least 48 hours prior to the first CHG shower.  You may shave your face/neck.  Please follow these instructions carefully:  1.  Shower with CHG Soap the night before surgery and the  morning of surgery.  2.  If you choose to wash your hair, wash your  hair first as usual with your normal  shampoo.  3.  After you shampoo, rinse your hair and body thoroughly to remove the shampoo.                             4.  Use CHG as you would any other liquid soap.  You can apply chg directly to the skin and wash.  Gently with a scrungie or clean washcloth.  5.  Apply the CHG Soap to your body ONLY FROM THE NECK DOWN.   Do   not use on face/ open                           Wound or open sores. Avoid contact with eyes, ears mouth and   genitals (private parts).                       Wash face,  Genitals (private parts) with your normal soap.             6.  Wash thoroughly, paying special attention to the area where your    surgery  will be performed.  7.  Thoroughly rinse your body with warm water from the neck down.  8.  DO NOT shower/wash with your normal soap after using and rinsing off the CHG Soap.                9.  Pat yourself dry with a clean towel.            10.  Wear clean pajamas.            11.  Place clean sheets on your bed the night of your first shower and do not  sleep with pets. Day of Surgery : Do not apply any lotions/deodorants the morning of surgery.  Please wear clean clothes to the hospital/surgery center.  FAILURE TO FOLLOW THESE INSTRUCTIONS MAY RESULT IN THE CANCELLATION OF YOUR SURGERY  PATIENT SIGNATURE_________________________________  NURSE SIGNATURE__________________________________  ________________________________________________________________________

## 2023-02-24 ENCOUNTER — Other Ambulatory Visit: Payer: Self-pay

## 2023-02-24 ENCOUNTER — Encounter (HOSPITAL_COMMUNITY)
Admission: RE | Admit: 2023-02-24 | Discharge: 2023-02-24 | Disposition: A | Discharge status: Home / Self Care | Payer: Medicare Other | Source: Ambulatory Visit | Attending: Urology | Admitting: Urology

## 2023-02-24 ENCOUNTER — Encounter (HOSPITAL_COMMUNITY): Payer: Self-pay

## 2023-02-24 DIAGNOSIS — Z01818 Encounter for other preprocedural examination: Secondary | ICD-10-CM | POA: Diagnosis not present

## 2023-02-24 DIAGNOSIS — Z8673 Personal history of transient ischemic attack (TIA), and cerebral infarction without residual deficits: Secondary | ICD-10-CM | POA: Diagnosis not present

## 2023-02-24 DIAGNOSIS — I493 Ventricular premature depolarization: Secondary | ICD-10-CM | POA: Insufficient documentation

## 2023-02-24 DIAGNOSIS — I5042 Chronic combined systolic (congestive) and diastolic (congestive) heart failure: Secondary | ICD-10-CM | POA: Insufficient documentation

## 2023-02-24 DIAGNOSIS — I252 Old myocardial infarction: Secondary | ICD-10-CM | POA: Insufficient documentation

## 2023-02-24 DIAGNOSIS — I2510 Atherosclerotic heart disease of native coronary artery without angina pectoris: Secondary | ICD-10-CM | POA: Insufficient documentation

## 2023-02-24 DIAGNOSIS — I700 Atherosclerosis of aorta: Secondary | ICD-10-CM | POA: Insufficient documentation

## 2023-02-24 HISTORY — DX: Anemia, unspecified: D64.9

## 2023-02-24 HISTORY — DX: Heart failure, unspecified: I50.9

## 2023-02-25 ENCOUNTER — Encounter (HOSPITAL_COMMUNITY): Payer: Self-pay

## 2023-02-25 NOTE — Anesthesia Preprocedure Evaluation (Addendum)
Anesthesia Evaluation  Patient identified by MRN, date of birth, ID band Patient awake    Reviewed: Allergy & Precautions, H&P , NPO status , Patient's Chart, lab work & pertinent test results  Airway Mallampati: III  TM Distance: >3 FB Neck ROM: Full    Dental  (+) Teeth Intact, Dental Advisory Given   Pulmonary sleep apnea and Continuous Positive Airway Pressure Ventilation , former smoker   Pulmonary exam normal breath sounds clear to auscultation       Cardiovascular hypertension (140/70 preop), Pt. on medications and Pt. on home beta blockers + CAD, + Past MI, + Cardiac Stents (2017 x 2) and +CHF  Normal cardiovascular exam Rhythm:Regular Rate:Normal  01/2023 ECHO: EF 40-45%, mildly decreased LVF, mild LVH, Grade 1 DD, normal RVF, mild-mod MR, mild AS with mean gradient 10 mmHg   Neuro/Psych CVA  negative psych ROS   GI/Hepatic negative GI ROS, Neg liver ROS,,,  Endo/Other  negative endocrine ROS    Renal/GU negative Renal ROSH/o stones   H/o prostate cancer Bladder cancer negative genitourinary   Musculoskeletal  (+) Arthritis , Osteoarthritis,    Abdominal   Peds negative pediatric ROS (+)  Hematology negative hematology ROS (+) H/o non-Hodgkin's lymphoma   Anesthesia Other Findings   Reproductive/Obstetrics negative OB ROS                             Anesthesia Physical Anesthesia Plan  ASA: 3  Anesthesia Plan: General   Post-op Pain Management: Tylenol PO (pre-op)*   Induction: Intravenous  PONV Risk Score and Plan: 2 and Ondansetron and Dexamethasone  Airway Management Planned: LMA and Oral ETT  Additional Equipment: None  Intra-op Plan:   Post-operative Plan: Extubation in OR  Informed Consent: I have reviewed the patients History and Physical, chart, labs and discussed the procedure including the risks, benefits and alternatives for the proposed anesthesia with  the patient or authorized representative who has indicated his/her understanding and acceptance.     Dental advisory given  Plan Discussed with: CRNA  Anesthesia Plan Comments:         Anesthesia Quick Evaluation

## 2023-02-25 NOTE — Progress Notes (Signed)
Case: [id number] Date/Time: 03/06/23 1015   Procedures:      CYSTOSCOPY WITH BIOPSY - 75 MINUTES FOR CASE     TRANSURETHRAL RESECTION OF BLADDER TUMOR (TURBT)   Anesthesia type: General   Pre-op diagnosis: BLADDER NEOPLASM   Location: [facility] PROCEDURE [facility] / [facility]   Surgeons: Jerilee Field, MD       DISCUSSION: Jose Ware is an 88 yo male who presents to PAT prior to surgery above. PMH of former smoking, hx of STEMI (2017) CAD s/p PCI (2017), CHF, hx of CVA, prostate cancer s/p XRT (2010), follicular lymphoma, anemia.  Patient developed gross hematuria in July 2024. Had cysto in office which showed a tumor. Now scheduled for above procedure.  Patient follows with Cardiology for CAD s/p PCI in 2017, hx of STEMI, combined systolic and diastolic CHF. Last seen in clinic on 12/24/22. Noted to be euvolemic. Echo was repeated on 01/15/23 which showed EF 40-45%, mild AS (mean gradient ), mild aortic root dilation. Cleared for surgery:  "Based on my last note he notes that he walks his neighborhood every day and had no complaints at the time.  His echocardiogram that was ordered was stable with no differences from his prior. He can complete over 4 METs. He is okay to hold aspirin 5 to 7 days prior to procedure.  Please resume a medically safe to do so"  Patient follows with Pulmonology for OSA. Last seen in 01/2022. He uses a CPAP.  He follows with Oncology for follicular lymphoma diagnosed in 2022. Last seen on 02/10/23. This is currently being observed since he is stage I-II.  VS: BP (!) 130/57   Pulse (!) 45   Temp 37 C (Oral)   Resp 14   Ht 6\' 1"  (1.854 m)   Wt 88.9 kg   SpO2 98%   BMI 25.86 kg/m   PROVIDERS: Shelva Majestic, MD Oncology: Jeanie Sewer, MD Cardiology: Peter Swaziland, MD Pulmonology: Coralyn Helling, MD  LABS: Labs reviewed: Acceptable for surgery. (all labs ordered are listed, but only abnormal results are displayed)  Labs Reviewed - No data to  display   IMAGES:  CT Renal 09/21/22:  IMPRESSION: 1. Filling defects in the posterior bladder on delayed images with some thickening and suggestion of irregular bladder wall enhancement in this area. Findings may be due to adherent clot however are concerning for urothelial neoplasm. Recommend urology consult. 2. Increased size of a few bulky retroperitoneal and retrocrural lymph nodes. 3. Similar lytic lesions in the lumbar vertebral bodies.   Aortic Atherosclerosis (ICD10-I70.0).  EKG 07/14/22:  NSR, rate 66 PVCs  CV:  Echo 01/15/23:   IMPRESSIONS    1. Left ventricular ejection fraction, by estimation, is 40 to 45%. Left ventricular ejection fraction by 3D volume is 45 %. The left ventricle has mildly decreased function. The left ventricle demonstrates regional wall motion abnormalities (see scoring diagram/findings for description). The left ventricular internal cavity size was moderately dilated. There is mild left ventricular hypertrophy. Left ventricular diastolic parameters are consistent with Grade I diastolic dysfunction (impaired relaxation). Elevated left ventricular end-diastolic pressure. The E/e' is 16. There is severe akinesis of the left ventricular, mid-apical inferior wall and inferoseptal wall.  2. Right ventricular systolic function is normal. The right ventricular size is normal. There is mildly elevated pulmonary artery systolic pressure.  3. Left atrial size was severely dilated.  4. The mitral valve is abnormal. Mild to moderate mitral valve regurgitation.  5. The aortic valve is tricuspid.  Aortic valve regurgitation is trivial. Mild aortic valve stenosis. Aortic valve area, by VTI measures 1.57 cm. Aortic valve mean gradient measures 10.0 mmHg. Aortic valve Vmax measures 2.20 m/s.  6. Aortic dilatation noted. There is mild dilatation of the ascending aorta, measuring 41 mm.  7. The inferior vena cava is normal in size with greater than  50% respiratory variability, suggesting right atrial pressure of 3 mmHg.  Comparison(s): No significant change from prior study. 06/01/2022: LVEF 40-45%, inferior distal septal and apical hypokinesis. Past Medical History:  Diagnosis Date   Adenocarcinoma of prostate [facility])    XRT & Radiation seed implantation in 2010   Adenomatous colon polyp    Anemia    CAD (coronary artery disease)    a. 11/04/15:  Acute inferolateral STEMI: S/p emergent DES of a very large LCx with extensive thrombus. Significant residual disease in the proximal LAD and distal RCA. S/p staged PCI of LAD and RCA 8/29.   Cataract    CHF (congestive heart failure) (HCC)    CVA (cerebral infarction)    a. 10/2015: embolic CVA after heart cath    CVA (cerebral infarction)    a. 10/2015: embolic CVA after heart cath    DIVERTICULITIS, HX OF 11/27/2006        DJD (degenerative joint disease)    wrist - R    Hernia    History of blood transfusion 1985   with colon surgery   History of kidney stones    Hypertension    Ischemic cardiomyopathy    a. EF 25-30% by LV gram on cath 11/04/15. Appeared out of proportion to infarct although infarct was large. EF improved by Echo and now 40-45%.   Myocardial infarction (HCC) 2019   NEPHROLITHIASIS, HX OF 11/27/2006         Non Hodgkin's lymphoma (HCC) 11/2020   watching at this point, in abdomen, no meds or chemo yet as of 01-27-2022   Peripheral neuropathy    pt denies   Pneumonia    as a child   Psoriasis    Sleep apnea    wears cpap   Stroke [facility])    no residuals   Tenosynovitis     Past Surgical History:  Procedure Laterality Date   APPENDECTOMY  03/11/1983   BACK SURGERY  03/10/1978   minor disk surgery: instrumentation placed and removed in a second procedure   CARDIAC CATHETERIZATION N/A 11/04/2015   Procedure: Left Heart Cath and Coronary Angiography;  Surgeon: Peter M Swaziland, MD;  Location: [facility] INVASIVE CV LAB;  Service: Cardiovascular;  Laterality: N/A;    CARDIAC CATHETERIZATION N/A 11/04/2015   Procedure: Coronary Stent Intervention;  Surgeon: Peter M Swaziland, MD;  Location: [facility] INVASIVE CV LAB;  Service: Cardiovascular;  Laterality: N/A;   CARDIAC CATHETERIZATION N/A 11/06/2015   Procedure: Coronary Stent Intervention;  Surgeon: Marykay Lex, MD;  Location: [facility] INVASIVE CV LAB;  Service: Cardiovascular;  Laterality: N/A;   CATARACT EXTRACTION, BILATERAL  03/11/2011   CHOLECYSTECTOMY  03/10/1984   COLON SURGERY  11/09/1983   pt. had ileus, had colon surgery, requiring colostomy & then reversal & then dehisence of that wound & return to OR for repair & cholecystectomy     COLONOSCOPY  2019   COLONOSCOPY WITH PROPOFOL N/A 03/13/2022   Procedure: COLONOSCOPY WITH PROPOFOL;  Surgeon: Meryl Dare, MD;  Location: [facility];  Service: Gastroenterology;  Laterality: N/A;   COLOSTOMY  03/11/1983   After colectomy for diverticulitis, pt. remarks during this  surgery they "gave me the paddles two times  because of bleeding", pt. states it was unrelated to any anesthesia complication   EYE SURGERY     /w IOL   KNEE ARTHROSCOPY Left 03/10/2001   KNEE SURGERY Left 03/10/1954   Left 1956, Right w/cartilage removed later   PARATHYROIDECTOMY N/A 05/12/2014   Procedure: PARATHYROIDECTOMY;  Surgeon: Darnell Level, MD;  Location: [facility] OR;  Service: General;  Laterality: N/A;   POLYPECTOMY     POLYPECTOMY  03/13/2022   Procedure: POLYPECTOMY;  Surgeon: Meryl Dare, MD;  Location: [facility];  Service: Gastroenterology;;   REVERSAL OF COLOSTOMY  03/10/1985   TONSILLECTOMY  03/10/1944   WRIST SURGERY Left    Remote complicated w/infection    MEDICATIONS:  acetaminophen (TYLENOL) 500 MG tablet   amLODipine (NORVASC) 5 MG tablet   aspirin EC 81 MG tablet   carvedilol (COREG) 3.125 MG tablet   Cyanocobalamin (VITAMIN B-12) 5000 MCG TBDP   docusate sodium (COLACE) 100 MG capsule   furosemide (LASIX) 20 MG tablet   Multiple Vitamins-Minerals  (PRESERVISION AREDS 2) CAPS   nitroGLYCERIN (NITROSTAT) 0.4 MG SL tablet   spironolactone (ALDACTONE) 25 MG tablet   No current facility-administered medications for this encounter.   Marcille Blanco [facility] Short Stay/Anesthesiology [facility] Phone [phone] 02/25/2023 1:17 PM

## 2023-02-26 ENCOUNTER — Encounter: Payer: Self-pay | Admitting: Family Medicine

## 2023-02-27 ENCOUNTER — Other Ambulatory Visit: Payer: Self-pay

## 2023-02-27 MED ORDER — NITROGLYCERIN 0.4 MG SL SUBL: 0.4000 mg | SUBLINGUAL_TABLET | SUBLINGUAL | 2 refills | Status: DC | PRN

## 2023-02-27 NOTE — Telephone Encounter (Signed)
Escribed to pt's pharmacy

## 2023-03-06 ENCOUNTER — Encounter (HOSPITAL_COMMUNITY): Payer: Self-pay | Admitting: Urology

## 2023-03-06 ENCOUNTER — Ambulatory Visit (HOSPITAL_COMMUNITY)
Admission: RE | Admit: 2023-03-06 | Discharge: 2023-03-06 | Disposition: A | Discharge status: Home / Self Care | Payer: Medicare Other | Attending: Urology | Admitting: Urology

## 2023-03-06 ENCOUNTER — Encounter (HOSPITAL_COMMUNITY)
Admission: RE | Disposition: A | Discharge status: Home / Self Care | Payer: Self-pay | Source: Home / Self Care | Attending: Urology

## 2023-03-06 ENCOUNTER — Ambulatory Visit (HOSPITAL_COMMUNITY): Payer: Medicare Other | Admitting: Medical

## 2023-03-06 ENCOUNTER — Ambulatory Visit (HOSPITAL_BASED_OUTPATIENT_CLINIC_OR_DEPARTMENT_OTHER): Payer: Medicare Other | Admitting: Anesthesiology

## 2023-03-06 ENCOUNTER — Other Ambulatory Visit: Payer: Self-pay

## 2023-03-06 DIAGNOSIS — Z87891 Personal history of nicotine dependence: Secondary | ICD-10-CM | POA: Insufficient documentation

## 2023-03-06 DIAGNOSIS — I509 Heart failure, unspecified: Secondary | ICD-10-CM | POA: Diagnosis not present

## 2023-03-06 DIAGNOSIS — C679 Malignant neoplasm of bladder, unspecified: Secondary | ICD-10-CM | POA: Insufficient documentation

## 2023-03-06 DIAGNOSIS — D414 Neoplasm of uncertain behavior of bladder: Secondary | ICD-10-CM

## 2023-03-06 DIAGNOSIS — Z8673 Personal history of transient ischemic attack (TIA), and cerebral infarction without residual deficits: Secondary | ICD-10-CM | POA: Insufficient documentation

## 2023-03-06 DIAGNOSIS — I2510 Atherosclerotic heart disease of native coronary artery without angina pectoris: Secondary | ICD-10-CM | POA: Insufficient documentation

## 2023-03-06 DIAGNOSIS — I252 Old myocardial infarction: Secondary | ICD-10-CM | POA: Insufficient documentation

## 2023-03-06 DIAGNOSIS — I110 Hypertensive heart disease with heart failure: Secondary | ICD-10-CM | POA: Insufficient documentation

## 2023-03-06 DIAGNOSIS — Z923 Personal history of irradiation: Secondary | ICD-10-CM | POA: Diagnosis not present

## 2023-03-06 DIAGNOSIS — D494 Neoplasm of unspecified behavior of bladder: Secondary | ICD-10-CM | POA: Diagnosis not present

## 2023-03-06 DIAGNOSIS — Z955 Presence of coronary angioplasty implant and graft: Secondary | ICD-10-CM | POA: Diagnosis not present

## 2023-03-06 DIAGNOSIS — G4730 Sleep apnea, unspecified: Secondary | ICD-10-CM | POA: Diagnosis not present

## 2023-03-06 DIAGNOSIS — I10 Essential (primary) hypertension: Secondary | ICD-10-CM | POA: Diagnosis not present

## 2023-03-06 DIAGNOSIS — R310 Gross hematuria: Secondary | ICD-10-CM | POA: Diagnosis not present

## 2023-03-06 DIAGNOSIS — Z8546 Personal history of malignant neoplasm of prostate: Secondary | ICD-10-CM | POA: Diagnosis not present

## 2023-03-06 HISTORY — PX: CYSTOSCOPY WITH BIOPSY: SHX5122

## 2023-03-06 HISTORY — PX: TRANSURETHRAL RESECTION OF BLADDER TUMOR: SHX2575

## 2023-03-06 SURGERY — CYSTOSCOPY, WITH BIOPSY
Anesthesia: General | Site: Bladder

## 2023-03-06 MED ORDER — STERILE WATER FOR IRRIGATION IR SOLN
Status: DC | PRN
  Administered 2023-03-06: 250 mL

## 2023-03-06 MED ORDER — SUGAMMADEX SODIUM 200 MG/2ML IV SOLN
INTRAVENOUS | Status: DC | PRN
  Administered 2023-03-06: 200 mg via INTRAVENOUS

## 2023-03-06 MED ORDER — ACETAMINOPHEN 500 MG PO TABS
1000.0000 mg | ORAL_TABLET | Freq: Once | ORAL | Status: AC
  Administered 2023-03-06: 1000 mg via ORAL
  Filled 2023-03-06: qty 2

## 2023-03-06 MED ORDER — PROPOFOL 10 MG/ML IV BOLUS
INTRAVENOUS | Status: AC
  Filled 2023-03-06: qty 20

## 2023-03-06 MED ORDER — CEPHALEXIN 500 MG PO CAPS: 500.0000 mg | ORAL_CAPSULE | Freq: Every day | ORAL | 0 refills | Status: AC

## 2023-03-06 MED ORDER — PROPOFOL 10 MG/ML IV BOLUS
INTRAVENOUS | Status: DC | PRN
  Administered 2023-03-06: 80 mg via INTRAVENOUS

## 2023-03-06 MED ORDER — FENTANYL CITRATE (PF) 100 MCG/2ML IJ SOLN
INTRAMUSCULAR | Status: AC
  Filled 2023-03-06: qty 2

## 2023-03-06 MED ORDER — LACTATED RINGERS IV SOLN: INTRAVENOUS | Status: DC

## 2023-03-06 MED ORDER — ORAL CARE MOUTH RINSE: 15.0000 mL | Freq: Once | OROMUCOSAL | Status: AC

## 2023-03-06 MED ORDER — FENTANYL CITRATE PF 50 MCG/ML IJ SOSY: 25.0000 ug | PREFILLED_SYRINGE | INTRAMUSCULAR | Status: DC | PRN

## 2023-03-06 MED ORDER — MIDAZOLAM HCL 2 MG/2ML IJ SOLN: 0.5000 mg | Freq: Once | INTRAMUSCULAR | Status: DC | PRN

## 2023-03-06 MED ORDER — ONDANSETRON HCL 4 MG/2ML IJ SOLN
INTRAMUSCULAR | Status: DC | PRN
  Administered 2023-03-06: 4 mg via INTRAVENOUS

## 2023-03-06 MED ORDER — EPHEDRINE SULFATE-NACL 50-0.9 MG/10ML-% IV SOSY
PREFILLED_SYRINGE | INTRAVENOUS | Status: DC | PRN
  Administered 2023-03-06: 10 mg via INTRAVENOUS

## 2023-03-06 MED ORDER — FENTANYL CITRATE (PF) 100 MCG/2ML IJ SOLN
INTRAMUSCULAR | Status: DC | PRN
  Administered 2023-03-06: 100 ug via INTRAVENOUS

## 2023-03-06 MED ORDER — ROCURONIUM BROMIDE 100 MG/10ML IV SOLN
INTRAVENOUS | Status: DC | PRN
  Administered 2023-03-06: 60 mg via INTRAVENOUS

## 2023-03-06 MED ORDER — DEXAMETHASONE SODIUM PHOSPHATE 10 MG/ML IJ SOLN
INTRAMUSCULAR | Status: DC | PRN
  Administered 2023-03-06: 8 mg via INTRAVENOUS

## 2023-03-06 MED ORDER — OXYCODONE HCL 5 MG PO TABS: 5.0000 mg | ORAL_TABLET | Freq: Once | ORAL | Status: DC | PRN

## 2023-03-06 MED ORDER — SODIUM CHLORIDE 0.9 % IV SOLN
2.0000 g | INTRAVENOUS | Status: AC
  Administered 2023-03-06: 2 g via INTRAVENOUS
  Filled 2023-03-06: qty 20

## 2023-03-06 MED ORDER — SODIUM CHLORIDE 0.9 % IR SOLN
Status: DC | PRN
  Administered 2023-03-06 (×4): 3000 mL via INTRAVESICAL

## 2023-03-06 MED ORDER — MEPERIDINE HCL 50 MG/ML IJ SOLN: 6.2500 mg | INTRAMUSCULAR | Status: DC | PRN

## 2023-03-06 MED ORDER — CHLORHEXIDINE GLUCONATE 0.12 % MT SOLN
15.0000 mL | Freq: Once | OROMUCOSAL | Status: AC
  Administered 2023-03-06: 15 mL via OROMUCOSAL

## 2023-03-06 MED ORDER — STERILE WATER FOR IRRIGATION IR SOLN
Status: DC | PRN
  Administered 2023-03-06: 3000 mL

## 2023-03-06 MED ORDER — ACETAMINOPHEN 500 MG PO TABS
1000.0000 mg | ORAL_TABLET | Freq: Once | ORAL | Status: DC
Start: 2023-03-06 — End: 2023-03-06

## 2023-03-06 MED ORDER — LIDOCAINE HCL (CARDIAC) PF 100 MG/5ML IV SOSY
PREFILLED_SYRINGE | INTRAVENOUS | Status: DC | PRN
  Administered 2023-03-06: 60 mg via INTRAVENOUS
  Administered 2023-03-06: 40 mg via INTRAVENOUS

## 2023-03-06 MED ORDER — OXYCODONE HCL 5 MG/5ML PO SOLN: 5.0000 mg | Freq: Once | ORAL | Status: DC | PRN

## 2023-03-06 SURGICAL SUPPLY — 12 items
BAG URINE DRAIN 2000ML AR STRL (UROLOGICAL SUPPLIES) IMPLANT
BAG URO CATCHER STRL LF (MISCELLANEOUS) ×1 IMPLANT
CATH FOLEY 2WAY SLVR 5CC 18FR (CATHETERS) IMPLANT
DRAPE FOOT SWITCH (DRAPES) ×1 IMPLANT
GLOVE SURG LX STRL 7.5 STRW (GLOVE) ×1 IMPLANT
GOWN STRL REUS W/ TWL XL LVL3 (GOWN DISPOSABLE) ×1 IMPLANT
KIT TURNOVER KIT A (KITS) IMPLANT
LOOP CUT BIPOLAR 24F LRG (ELECTROSURGICAL) IMPLANT
MANIFOLD NEPTUNE II (INSTRUMENTS) ×1 IMPLANT
PACK CYSTO (CUSTOM PROCEDURE TRAY) ×1 IMPLANT
TUBING CONNECTING 10 (TUBING) ×1 IMPLANT
TUBING UROLOGY SET (TUBING) ×1 IMPLANT

## 2023-03-06 NOTE — Anesthesia Postprocedure Evaluation (Signed)
Anesthesia Post Note  Patient: Jose Ware  Procedure(s) Performed: CYSTOSCOPY WITH BIOPSY (Bladder) TRANSURETHRAL RESECTION OF BLADDER TUMOR (TURBT) (Bladder)     Patient location during evaluation: PACU Anesthesia Type: General Level of consciousness: awake and alert, patient cooperative and oriented Pain management: pain level controlled Vital Signs Assessment: post-procedure vital signs reviewed and stable Respiratory status: spontaneous breathing, nonlabored ventilation and respiratory function stable Cardiovascular status: blood pressure returned to baseline and stable Postop Assessment: no apparent nausea or vomiting Anesthetic complications: yes   Encounter Notable Events  Notable Event Outcome Phase Comment  Difficult to intubate - expected  Intraprocedure Filed from anesthesia note documentation.    Last Vitals:  Vitals:   03/06/23 1218 03/06/23 1225  BP:    Pulse:    Resp:    Temp:    SpO2: 98% 95%    Last Pain:  Vitals:   03/06/23 1218  TempSrc:   PainSc: 0-No pain                 Diarra Ceja,E. Paxton Binns

## 2023-03-06 NOTE — Transfer of Care (Signed)
Immediate Anesthesia Transfer of Care Note  Patient: Jose Ware  Procedure(s) Performed: CYSTOSCOPY WITH BIOPSY (Bladder) TRANSURETHRAL RESECTION OF BLADDER TUMOR (TURBT) (Bladder)  Patient Location: PACU  Anesthesia Type:General  Level of Consciousness: drowsy  Airway & Oxygen Therapy: Patient Spontanous Breathing and Patient connected to face mask  Post-op Assessment: Report given to RN  Post vital signs: Reviewed and stable  Last Vitals:  Vitals Value Taken Time  BP 149/78 03/06/23 1207  Temp    Pulse 54 03/06/23 1210  Resp 16 03/06/23 1210  SpO2 100 % 03/06/23 1210  Vitals shown include unfiled device data.  Last Pain:  Vitals:   03/06/23 0840  TempSrc:   PainSc: 0-No pain         Complications:  Encounter Notable Events  Notable Event Outcome Phase Comment  Difficult to intubate - expected  Intraprocedure Filed from anesthesia note documentation.

## 2023-03-06 NOTE — Discharge Instructions (Signed)
Removal of the Foley: Remove the Foley as instructed on Monday morning March 09, 2023. There is 10 ml in the balloon.

## 2023-03-06 NOTE — Anesthesia Procedure Notes (Signed)
Procedure Name: Intubation Date/Time: 03/06/2023 10:49 AM  Performed by: Randa Evens, CRNAPre-anesthesia Checklist: Patient identified, Emergency Drugs available, Suction available and Patient being monitored Patient Re-evaluated:Patient Re-evaluated prior to induction Oxygen Delivery Method: Circle System Utilized Preoxygenation: Pre-oxygenation with 100% oxygen Induction Type: IV induction Ventilation: Mask ventilation without difficulty Laryngoscope Size: Glidescope and 4 Grade View: Grade I Tube type: Oral Tube size: 7.5 mm Number of attempts: 1 Airway Equipment and Method: Stylet and Oral airway Placement Confirmation: ETT inserted through vocal cords under direct vision, positive ETCO2 and breath sounds checked- equal and bilateral Secured at: 23 cm Tube secured with: Tape Dental Injury: Teeth and Oropharynx as per pre-operative assessment  Difficulty Due To: Difficulty was anticipated Comments: Elected for video intubation due to prior airway note; no difficulty intubating with glidescope.

## 2023-03-06 NOTE — Op Note (Signed)
Preoperative diagnosis: Bladder neoplasm uncertain malignant potential Postoperative diagnosis: Same  Procedure: TURBT 2 to 5 cm   Surgeon: Mena Goes  Anesthesia: General  Indication for procedure: Jeans a an 88 year old male with a history of gross hematuria.  CT revealed right bladder wall thickening and cystoscopy revealed some papillary tumor in the right bladder wall and a nodular component more inferior.  He was brought today for above procedure.  Discussed risk of bleeding, infection, bladder perforation, incontinence and stricture among others.  Exam under anesthesia revealed lower abdominal scars and scarring of his bladder.  His bladder would not drop at all making resection difficult.  Likely related to his prior surgery.  Penis was circumcised without mass or lesion.  The glans and meatus appeared normal.  Testicles descended bilaterally and palpably normal.  Scrotum appeared normal.  On DRE prostate was firm, flat and smooth without nodule consistent with prior radiation.  No pelvic mass noted on exam.   On cystoscopy the urethra was unremarkable.  Prostatic urethra short and nonobstructive, but his prostate was stiff and fixed as well making angle of the scope very difficult.  In fact I could not see the lesion with a 30 degree lens.  I initially located it with a 70 degree lens.  Very difficult to torque the bipolar, also tried the long monopolar to access this area.  With a fist suprapubically, careful control of the water volume in the bladder and torque of the scope I was just able to get a biopsy of the papillary tumor and nodular component.  I then fulgurated the entire area the best I could but no residual papillary component.  Also tried to resect at 10-11 o'clock in the prostatic urethra to loosen up some of the tissue to get access to the tumor area.   Otherwise there was no stone or foreign body in the bladder.  The trigone and ureteral orifices were drawn up toward the  bladder neck.  Description of procedure: After consent was obtained patient brought to the operating room.  After adequate anesthesia he was placed in lithotomy position and prepped and draped in the usual sterile fashion.  Timeout was performed to confirm the patient and procedure.  The cystoscope was passed per urethra and the bladder inspected.  The the prostate and bladder neck again were fixed and I could not see the tumor despite torquing the scope.  Therefore I took a 70 degree lens and inspected and located the abnormal area right superior and lateral.  This angle also made it difficult for the loop to access.  The urethra was a bit tight on the 22 [place] scope so I dilated him to 62 [place] without difficulty and those dilators actually passed easily.  I then passed the 26 French resectoscope sheath with the visual obturator and then swapped that out for the loop and the handle.  With significant torque on the scope I tried to access this area.  There was even bending the lens.  I backed out and resected in the prostatic urethra at 10 11:00 range to see if I could not loosen up some of the scar tissue.  The assistant then put some pressure with a fist suprapubically and then careful control of the water and significant torque of the scope was just able to resect some of the nodular component as well as some of the papillary component.  I then fulgurated the area the best I could.  Because I had done some of the  prostate I sent that as prostate chips/bladder neck, and then the bladder sent as right bladder wall.  Because of the difficulty of draining his bladder his bladder was fixed and suspended, some of the specimens could have crossed.  We were able to then locate the long monopolar and I inserted that we switched over to water.  That really got no better access.  It was not so much the length up as it was the angle.  But, hemostasis was excellent low-pressure.  I fulgurated the prostatic urethra a bit  because it was sheared in places from torquing the scope.  I then backed the scope out and placed an 18 French Foley catheter.  Irrigation was clear.  Exam under anesthesia performed.  He was then awakened taken the cover room in stable condition.  Complications: None  Blood loss: Minimal  Specimens to pathology: #1 TURP/bladder neck chips #2 right bladder wall resection  Drains: 18 French Foley catheter  Disposition: Patient stable to PACU

## 2023-03-06 NOTE — H&P (Signed)
H&P  Chief Complaint: Gross hematuria, bladder mass  History of Present Illness: Jose Ware is an 88 year old male with a history of prostate cancer status post brachytherapy in 2009 and a history of follicular lymphoma currently followed with surveillance by Dr. Leonides Schanz.  He developed gross hematuria July 2024.  CT scan of the abdomen and pelvis revealed asymmetric right bladder wall thickening with 2 foci of a possible mass.  There is a slight increase in his known retroperitoneal and retrocrural lymph adenopathy.  Office cystoscopy November 2024 revealed some papillary tumor on the right lateral wall and a more nodular component inferiorly.  He presents today for cystoscopy with bladder biopsy and TURBT.  He has been well without cough cold or congestion.  No dysuria or gross hematuria.  Past Medical History:  Diagnosis Date   Adenocarcinoma of prostate [facility])    XRT & Radiation seed implantation in 2010   Adenomatous colon polyp    Anemia    CAD (coronary artery disease)    a. 11/04/15:  Acute inferolateral STEMI: S/p emergent DES of a very large LCx with extensive thrombus. Significant residual disease in the proximal LAD and distal RCA. S/p staged PCI of LAD and RCA 8/29.   Cataract    CHF (congestive heart failure) (HCC)    CVA (cerebral infarction)    a. 10/2015: embolic CVA after heart cath    DIVERTICULITIS, HX OF 11/27/2006        DJD (degenerative joint disease)    wrist - R    Hernia    History of blood transfusion 1985   with colon surgery   History of kidney stones    Hypertension    Ischemic cardiomyopathy    a. EF 25-30% by LV gram on cath 11/04/15. Appeared out of proportion to infarct although infarct was large. EF improved by Echo and now 40-45%.   Myocardial infarction (HCC) 2019   NEPHROLITHIASIS, HX OF 11/27/2006         Non Hodgkin's lymphoma (HCC) 11/2020   watching at this point, in abdomen, no meds or chemo yet as of 01-27-2022   Peripheral neuropathy    pt denies    Pneumonia    as a child   Psoriasis    Sleep apnea    wears cpap   Stroke [facility])    no residuals   Tenosynovitis    Past Surgical History:  Procedure Laterality Date   APPENDECTOMY  03/11/1983   BACK SURGERY  03/10/1978   minor disk surgery: instrumentation placed and removed in a second procedure   CARDIAC CATHETERIZATION N/A 11/04/2015   Procedure: Left Heart Cath and Coronary Angiography;  Surgeon: Peter M Swaziland, MD;  Location: [facility] INVASIVE CV LAB;  Service: Cardiovascular;  Laterality: N/A;   CARDIAC CATHETERIZATION N/A 11/04/2015   Procedure: Coronary Stent Intervention;  Surgeon: Peter M Swaziland, MD;  Location: [facility] INVASIVE CV LAB;  Service: Cardiovascular;  Laterality: N/A;   CARDIAC CATHETERIZATION N/A 11/06/2015   Procedure: Coronary Stent Intervention;  Surgeon: Marykay Lex, MD;  Location: [facility] INVASIVE CV LAB;  Service: Cardiovascular;  Laterality: N/A;   CATARACT EXTRACTION, BILATERAL  03/11/2011   CHOLECYSTECTOMY  03/10/1984   COLON SURGERY  11/09/1983   pt. had ileus, had colon surgery, requiring colostomy & then reversal & then dehisence of that wound & return to OR for repair & cholecystectomy     COLONOSCOPY  2019   COLONOSCOPY WITH PROPOFOL N/A 03/13/2022   Procedure: COLONOSCOPY WITH PROPOFOL;  Surgeon: Russella Dar,  Venita Lick, MD;  Location: [facility];  Service: Gastroenterology;  Laterality: N/A;   COLOSTOMY  03/11/1983   After colectomy for diverticulitis, pt. remarks during this surgery they "gave me the paddles two times  because of bleeding", pt. states it was unrelated to any anesthesia complication   EYE SURGERY     /w IOL   KNEE ARTHROSCOPY Left 03/10/2001   KNEE SURGERY Left 03/10/1954   Left 1956, Right w/cartilage removed later   PARATHYROIDECTOMY N/A 05/12/2014   Procedure: PARATHYROIDECTOMY;  Surgeon: Darnell Level, MD;  Location: [facility] OR;  Service: General;  Laterality: N/A;   POLYPECTOMY     POLYPECTOMY  03/13/2022   Procedure: POLYPECTOMY;  Surgeon:  Meryl Dare, MD;  Location: [facility];  Service: Gastroenterology;;   REVERSAL OF COLOSTOMY  03/10/1985   TONSILLECTOMY  03/10/1944   WRIST SURGERY Left    Remote complicated w/infection    Home Medications:  Medications Prior to Admission  Medication Sig Dispense Refill Last Dose/Taking   acetaminophen (TYLENOL) 500 MG tablet Take 500 mg by mouth every 6 (six) hours as needed for moderate pain (pain score 4-6).   03/06/2023 at  5:00 AM   amLODipine (NORVASC) 5 MG tablet Take 1 tablet by mouth once daily 90 tablet 0 03/06/2023 at  5:00 AM   aspirin EC 81 MG tablet Take 1 tablet (81 mg total) by mouth daily. 90 tablet 1 02/26/2023   carvedilol (COREG) 3.125 MG tablet Take 1 tablet by mouth twice daily 180 tablet 2 03/06/2023 at  5:00 AM   Cyanocobalamin (VITAMIN B-12) 5000 MCG TBDP Take 5,000 mcg by mouth every morning.   02/26/2023   docusate sodium (COLACE) 100 MG capsule Take 100 mg by mouth 2 (two) times daily.   02/26/2023   furosemide (LASIX) 20 MG tablet TAKE 1 TABLET BY MOUTH ONCE DAILY. TAKE 1 ADDITIONAL TABLET BY MOUTH FOR A WEIGHT GAIN OF 178 OR GREATER 60 tablet 5 02/26/2023   Multiple Vitamins-Minerals (PRESERVISION AREDS 2) CAPS Take 1 capsule by mouth 2 (two) times daily.   02/26/2023   spironolactone (ALDACTONE) 25 MG tablet Take 25 mg by mouth daily. Per cardiology   02/26/2023   nitroGLYCERIN (NITROSTAT) 0.4 MG SL tablet Place 1 tablet (0.4 mg total) under the tongue every 5 (five) minutes x 3 doses as needed for chest pain. 25 tablet 2 Unknown   Allergies:  Allergies  Allergen Reactions   Bee Venom Swelling    Facial swelling   Betadine [Povidone Iodine] Other (See Comments)    blistering   Doxycycline     Possible drug rash- back of right lower leg and onto left lower leg as well    Family History  Problem Relation Age of Onset   Coronary artery disease Mother    Alcohol abuse Father    Cirrhosis Father    Heart failure Sister    Breast cancer Sister         W/involvement of right arm leading to amputation   Anemia Sister        related to treatment to lymphoma   Non-Hodgkin's lymphoma Sister    Other Sister        died from covid April 12, 2020   Coronary artery disease Brother    Heart attack Brother    Non-Hodgkin's lymphoma Brother    Clotting disorder Brother    Non-Hodgkin's lymphoma Brother    Prostate cancer Neg Hx    Colon cancer Neg Hx    Diabetes Neg  Hx    Glaucoma Neg Hx    Rectal cancer Neg Hx    Esophageal cancer Neg Hx    Colon polyps Neg Hx    Stomach cancer Neg Hx    Social History:  reports that he quit smoking about 40 years ago. His smoking use included cigarettes. He started smoking about 70 years ago. He has a 60 pack-year smoking history. He has never used smokeless tobacco. He reports that he does not currently use alcohol. He reports that he does not use drugs.  ROS: A complete review of systems was performed.  All systems are negative except for pertinent findings as noted. Review of Systems  All other systems reviewed and are negative.    Physical Exam:  Vital signs in last 24 hours: Temp:  [97.5 F (36.4 C)] 97.5 F (36.4 C) (12/27 0821) Pulse Rate:  [49] 49 (12/27 0821) Resp:  [15] 15 (12/27 0821) BP: (140)/(70) 140/70 (12/27 0840) SpO2:  [98 %] 98 % (12/27 0821) Weight:  [84.4 kg] 84.4 kg (12/27 0840) General:  Alert and oriented, No acute distress HEENT: Normocephalic, atraumatic Cardiovascular: Regular rate and rhythm Lungs: Regular rate and effort Abdomen: Soft, nontender, nondistended, no abdominal masses Back: No CVA tenderness Extremities: No edema Neurologic: Grossly intact  Laboratory Data:  No results found for this or any previous visit (from the past 24 hours). No results found for this or any previous visit (from the past 240 hours). Creatinine: No results for input(s): "CREATININE" in the last 168 hours.  Impression/Assessment:  Gross hematuria, bladder neoplasm  uncertain-  Plan:  I discussed with the patient the and his wife (retired Engineer, civil (consulting)) the nature, potential benefits, risks and alternatives to cystoscopy with bladder biopsy and TURBT, including side effects of the proposed treatment, the likelihood of the patient achieving the goals of the procedure, and any potential problems that might occur during the procedure or recuperation.  Discussed he may need a Foley catheter or ureteral stent.  They are comfortable with removing the catheter at home.  Discussed some of the potential etiology-benign versus malignant.  Discussed need for repeat staging/TUR on occasion.  All questions answered. Patient elects to proceed.   Jerilee Field 03/06/2023, 10:16 AM

## 2023-03-07 ENCOUNTER — Encounter (HOSPITAL_COMMUNITY): Payer: Self-pay | Admitting: Urology

## 2023-03-10 LAB — SURGICAL PATHOLOGY

## 2023-03-19 DIAGNOSIS — C672 Malignant neoplasm of lateral wall of bladder: Secondary | ICD-10-CM | POA: Diagnosis not present

## 2023-03-21 ENCOUNTER — Other Ambulatory Visit: Payer: Self-pay | Admitting: Family Medicine

## 2023-03-23 ENCOUNTER — Encounter: Payer: Self-pay | Admitting: Primary Care

## 2023-03-23 ENCOUNTER — Ambulatory Visit: Payer: Medicare Other | Admitting: Primary Care

## 2023-03-23 NOTE — Telephone Encounter (Signed)
 Please see mychart sent by pt as an FYI.

## 2023-04-06 DIAGNOSIS — C672 Malignant neoplasm of lateral wall of bladder: Secondary | ICD-10-CM | POA: Diagnosis not present

## 2023-04-10 NOTE — Progress Notes (Signed)
 04/14/2023 Jose Ware   11-30-1934  [id number]  Primary Physician Katrinka [name], MD Primary Cardiologist: Dr Jessia Kief  HPI:  Jose Ware is seen for follow up CAD. He has a history of HTN, remote tobacco abuse, psoriasis, prostate cancer s/p XRT and radiation seed therapy, adenomatous colon polyps and diverticulitis s/p previous abdominal surgeries.   He presented to [facility] on 11/04/15 as a inferolateral STEMI. Emergent cath revealed a very large LCx with extensive thrombus. He underwent PCI with DES. With reperfusion he had NSVT. The pt also had residual RCA and LAD disease and an EF of 25% at cath. Echo done 11/05/15 showed an EF of 40-45%. His Troponin peaked at 25. On 11/06/15 he was taken back to the lab for staged PCI to his LAD and RCA. This was successful but on 11/07/15 the pt related that he developed some word finding issues that started post PCI 8/29. MRI revealed scattered small acute infarcts in the bilateral anterior and posterior circulation.  CA dopplers revealed mild plaque (1-39%). The pt's symptoms improved.   He was admitted in September 2022 with partial SBO and cellulitis of upper arm. CT showed retroperitoneal adenopathy. PSA was normal. Repeat CT by oncology showed persistent adenopathy. Biopsy was positive for follicular lymphoma. This was felt to be low grade and is being followed closely by Dr Jose Ware.    He was admitted 1/11-1/20/24 with partial small bowel obstruction treated medically.   He was seen in October with stable CHF symptoms. Echo repeated and was unchanged with EF 40-45%  He has been diagnosed with aggressive bladder cancer. Is seeing surgery in early March to see if he is a candidate for surgery. This would require cystectomy. He is not sure he wants surgery but will talk to surgeon. He has infrequent symptoms where he can't breathe well - usually after sitting long time. No swelling. Weight is stable. No real pain. His activity is limited because  his legs give out easily.   Current Outpatient Medications  Medication Sig Dispense Refill   acetaminophen  (TYLENOL ) 500 MG tablet Take 500 mg by mouth every 6 (six) hours as needed for moderate pain (pain score 4-6).     amLODipine  (NORVASC ) 5 MG tablet Take 1 tablet by mouth once daily 90 tablet 0   aspirin  EC 81 MG tablet Take 1 tablet (81 mg total) by mouth daily. 90 tablet 1   carvedilol  (COREG ) 3.125 MG tablet Take 1 tablet by mouth twice daily 180 tablet 2   Cyanocobalamin  (VITAMIN B-12) 5000 MCG TBDP Take 5,000 mcg by mouth every morning.     docusate sodium  (COLACE) 100 MG capsule Take 100 mg by mouth 2 (two) times daily.     furosemide  (LASIX ) 20 MG tablet TAKE 1 TABLET BY MOUTH ONCE DAILY. TAKE 1 ADDITIONAL TABLET BY MOUTH FOR A WEIGHT GAIN OF 178 OR GREATER 60 tablet 5   Multiple Vitamins-Minerals (PRESERVISION AREDS 2) CAPS Take 1 capsule by mouth 2 (two) times daily.     nitroGLYCERIN  (NITROSTAT ) 0.4 MG SL tablet Place 1 tablet (0.4 mg total) under the tongue every 5 (five) minutes x 3 doses as needed for chest pain. 25 tablet 2   spironolactone  (ALDACTONE ) 25 MG tablet Take 25 mg by mouth daily. Per cardiology     No current facility-administered medications for this visit.    Allergies  Allergen Reactions   Bee Venom Swelling    Facial swelling   Betadine [Povidone Iodine] Other (See Comments)  blistering   Doxycycline      Possible drug rash- back of right lower leg and onto left lower leg as well    Social History   Socioeconomic History   Marital status: Married    Spouse name: Not on file   Number of children: 3   Years of education: Not on file   Highest education level: Bachelor's degree (e.g., BA, AB, BS)  Occupational History   Not on file  Tobacco Use   Smoking status: Former    Current packs/day: 0.00    Average packs/day: 2.0 packs/day for 30.0 years (60.0 ttl pk-yrs)    Types: Cigarettes    Start date: 03/10/1953    Quit date: 03/11/1983    Years  since quitting: 40.1   Smokeless tobacco: Never   Tobacco comments:    started smoking in the service; ICU 40' part of his stomach; appendix; coma   Vaping Use   Vaping status: Never Used  Substance and Sexual Activity   Alcohol use: Not Currently    Comment: RARE, maybe one drink a couple times a year   Drug use: Never   Sexual activity: Not Currently  Other Topics Concern   Not on file  Social History Narrative   [employer], [place].Married 36 - Divorced 1996; remarried '03 different wife3 sons - '63, '65, '67Grandchildren -14; 1 great grand daughter; 4 great grands on wife's side, 1 great great granddaughter      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      Hobbies: fishing, busy volunteering   Social Drivers of Health   Financial Resource Strain: Low Risk  (01/08/2023)   Overall Financial Resource Strain (CARDIA)    Difficulty of Paying Living Expenses: Not very hard  Food Insecurity: No Food Insecurity (01/08/2023)   Hunger Vital Sign    Worried About Running Out of Food in the Last Year: Never true    Ran Out of Food in the Last Year: Never true  Transportation Needs: No Transportation Needs (01/08/2023)   PRAPARE - Administrator, Civil Service (Medical): No    Lack of Transportation (Non-Medical): No  Physical Activity: Inactive (01/08/2023)   Exercise Vital Sign    Days of Exercise per Week: 0 days    Minutes of Exercise per Session: 10 min  Stress: No Stress Concern Present (01/08/2023)   [employer] of Occupational Health - Occupational Stress Questionnaire    Feeling of Stress : Only a little  Social Connections: Moderately Integrated (01/08/2023)   Social Connection and Isolation Panel [NHANES]    Frequency of Communication with Friends and Family: Once a week    Frequency of Social Gatherings with Friends and Family: Once a week    Attends Religious Services: More than 4 times per year    Active Member of  [employer] or Organizations: Yes    Attends Banker Meetings: More than 4 times per year    Marital Status: Married  Catering Manager Violence: Not At Risk (05/31/2022)   Humiliation, Afraid, Rape, and Kick questionnaire    Fear of Current or Ex-Partner: No    Emotionally Abused: No    Physically Abused: No    Sexually Abused: No     Review of Systems: As noted in HPI.  All other systems reviewed and are otherwise negative except as noted above.  Blood pressure (!) 140/60, pulse 64, height 6' 1 (1.854 m), weight 194 lb (88 kg), SpO2  97%.  GENERAL:  Well appearing WM in NAD HEENT:  PERRL, EOMI, sclera are clear. Oropharynx is clear. NECK:  No jugular venous distention, carotid upstroke brisk and symmetric, no bruits, no thyromegaly or adenopathy LUNGS:  Clear to auscultation bilaterally CHEST:  Unremarkable HEART:  RRR,  PMI not displaced or sustained,S1 and S2 within normal limits, no S3, no S4: no clicks, no rubs, no murmurs ABD:  Soft, nontender. BS +, no masses or bruits. No hepatomegaly, no splenomegaly EXT:  2 + pulses throughout, no edema, no cyanosis no clubbing SKIN:  Warm and dry.  No rashes NEURO:  Alert and oriented x 3. Cranial nerves II through XII intact. PSYCH:  Cognitively intact   Laboratory data:  Lab Results  Component Value Date   WBC 6.6 02/11/2023   HGB 12.3 (L) 02/11/2023   HCT 37.4 (L) 02/11/2023   PLT 204 02/11/2023   GLUCOSE 107 (H) 02/11/2023   CHOL 68 03/28/2022   TRIG 56 03/28/2022   HDL 20 (L) 03/28/2022   LDLDIRECT 33.0 06/25/2020   LDLCALC 37 03/28/2022   ALT 9 02/11/2023   AST 11 (L) 02/11/2023   NA 142 02/11/2023   K 3.9 02/11/2023   CL 105 02/11/2023   CREATININE 1.17 02/11/2023   BUN 32 (H) 02/11/2023   CO2 32 02/11/2023   TSH 3.09 06/20/2019   PSA 0.00 (L) 01/09/2022   INR 1.1 12/17/2020   HGBA1C 5.3 11/04/2015    Ecg: not done today   Echo 07/29/21: IMPRESSIONS     1. Poor acoustic windows limit study Difficult  to see endocardium. LVEF  is depressed, mild/mild to moderate. Lateral and inferior wall appear  hypokinetic     WOuld reocmm limited echo with Defnity to further define LVEF and wall  motion.. The left ventricular internal cavity size was mildly dilated.  There is mild left ventricular hypertrophy.   2. Right ventricular systolic function is normal. The right ventricular  size is normal.   3. Left atrial size was severely dilated.   4. The mitral valve is normal in structure. Mild mitral valve  regurgitation.   5. AV is thickened, calcified with very mildly restricted motion. Peak  and mean gradients through the valve are 21 and 11 mm Hg respectively..  The aortic valve is tricuspid. Aortic valve regurgitation is not  visualized.   6. Aortic dilatation noted. There is borderline dilatation of the  ascending aorta and of the aortic root, measuring 38 mm. There is mild  dilatation of the aortic root and of the ascending aorta, measuring 39 mm.   7. The inferior vena cava is normal in size with greater than 50%  respiratory variability, suggesting right atrial pressure of 3 mmHg.   Echo 01/15/23: IMPRESSIONS     1. Left ventricular ejection fraction, by estimation, is 40 to 45%. Left  ventricular ejection fraction by 3D volume is 45 %. The left ventricle has  mildly decreased function. The left ventricle demonstrates regional wall  motion abnormalities (see  scoring diagram/findings for description). The left ventricular internal  cavity size was moderately dilated. There is mild left ventricular  hypertrophy. Left ventricular diastolic parameters are consistent with  Grade I diastolic dysfunction (impaired  relaxation). Elevated left ventricular end-diastolic pressure. The E/e' is  16. There is severe akinesis of the left ventricular, mid-apical inferior  wall and inferoseptal wall.   2. Right ventricular systolic function is normal. The right ventricular  size is normal. There is  mildly elevated  pulmonary artery systolic  pressure.   3. Left atrial size was severely dilated.   4. The mitral valve is abnormal. Mild to moderate mitral valve  regurgitation.   5. The aortic valve is tricuspid. Aortic valve regurgitation is trivial.  Mild aortic valve stenosis. Aortic valve area, by VTI measures 1.57 cm.  Aortic valve mean gradient measures 10.0 mmHg. Aortic valve Vmax measures  2.20 m/s.   6. Aortic dilatation noted. There is mild dilatation of the ascending  aorta, measuring 41 mm.   7. The inferior vena cava is normal in size with greater than 50%  respiratory variability, suggesting right atrial pressure of 3 mmHg.   Comparison(s): No significant change from prior study. 06/01/2022: LVEF  40-45%, inferior distal septal and apical hypokinesis.    ASSESSMENT AND PLAN:  1. CAD s/p inferolateral STEMI in August 2017 with thrombotic occlusion of the LCx. S/p DES. Subsequent staged PCI of the proximal LAD and RCA. He has class 1 angina but assessment somewhat limited by reduced mobility. Continue ASA. Continue low dose Coreg . If surgery is planned I would recommend a Myoview  study to assess pre op risk. If he is not felt to be a surgery candidate then there is no reason to do stress test. From a CHF standpoint he is doing well.  2. Ischemic LV dysfunction/ chronic systolic CHF. Class 1 symptoms.  On Coreg . lasix , aldactone . Volume status looks good. Not on ARB due to CKD.  EF mild to moderately reduced on last Echo in October.  3. 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 No residual symptoms.  4. Hypertension. Well controlled  5. Hyperlipidemia. On lipitor  10 mg every other day. LDL 37 6. Sleep apnea. CPAP per pulmonary 7. Follicular lymphoma- per oncology 8. Recent partial SBO. Felt to be related to old adhesions.  9. Invasive bladder CA. Awaiting surgery evaluation   PLAN   Follow up in 6 months  Ashaya Raftery MD,FACC 04/14/2023 10:36 AM

## 2023-04-11 ENCOUNTER — Other Ambulatory Visit: Payer: Self-pay | Admitting: Cardiology

## 2023-04-14 ENCOUNTER — Encounter: Payer: Self-pay | Admitting: Cardiology

## 2023-04-14 ENCOUNTER — Ambulatory Visit: Payer: Medicare Other | Attending: Cardiology | Admitting: Cardiology

## 2023-04-14 VITALS — BP 140/60 | HR 64 | Ht 73.0 in | Wt 194.0 lb

## 2023-04-14 DIAGNOSIS — I2510 Atherosclerotic heart disease of native coronary artery without angina pectoris: Secondary | ICD-10-CM | POA: Diagnosis not present

## 2023-04-14 DIAGNOSIS — I10 Essential (primary) hypertension: Secondary | ICD-10-CM

## 2023-04-14 DIAGNOSIS — I5042 Chronic combined systolic (congestive) and diastolic (congestive) heart failure: Secondary | ICD-10-CM

## 2023-04-14 DIAGNOSIS — E785 Hyperlipidemia, unspecified: Secondary | ICD-10-CM

## 2023-04-14 MED ORDER — ATORVASTATIN CALCIUM 10 MG PO TABS: 10.0000 mg | ORAL_TABLET | ORAL | 3 refills | Status: AC

## 2023-04-14 NOTE — Patient Instructions (Signed)
 Medication Instructions:  Continue same medications *If you need a refill on your cardiac medications before your next appointment, please call your pharmacy*   Lab Work: None ordered   Testing/Procedures: None ordered   Follow-Up: At [facility], you and your health needs are our priority.  As part of our continuing mission to provide you with exceptional heart care, we have created designated Provider Care Teams.  These Care Teams include your primary Cardiologist (physician) and Advanced Practice Providers (APPs -  Physician Assistants and Nurse Practitioners) who all work together to provide you with the care you need, when you need it.  We recommend signing up for the patient portal called "MyChart".  Sign up information is provided on this After Visit Summary.  MyChart is used to connect with patients for Virtual Visits (Telemedicine).  Patients are able to view lab/test results, encounter notes, upcoming appointments, etc.  Non-urgent messages can be sent to your provider as well.   To learn more about what you can do with MyChart, go to [email or web address].    Your next appointment:  6 months    Call in June to schedule August appointment     Provider:  Dr.Jordan

## 2023-04-16 ENCOUNTER — Encounter: Payer: Self-pay | Admitting: Gastroenterology

## 2023-04-16 ENCOUNTER — Ambulatory Visit: Payer: Medicare Other | Admitting: Gastroenterology

## 2023-04-16 VITALS — BP 122/80 | HR 82 | Ht 73.0 in | Wt 197.0 lb

## 2023-04-16 DIAGNOSIS — I2510 Atherosclerotic heart disease of native coronary artery without angina pectoris: Secondary | ICD-10-CM

## 2023-04-16 DIAGNOSIS — C679 Malignant neoplasm of bladder, unspecified: Secondary | ICD-10-CM

## 2023-04-16 DIAGNOSIS — Z860100 Personal history of colon polyps, unspecified: Secondary | ICD-10-CM

## 2023-04-16 DIAGNOSIS — Z860101 Personal history of adenomatous and serrated colon polyps: Secondary | ICD-10-CM | POA: Diagnosis not present

## 2023-04-16 DIAGNOSIS — Z8679 Personal history of other diseases of the circulatory system: Secondary | ICD-10-CM

## 2023-04-16 NOTE — Progress Notes (Signed)
 Discussed the use of AI scribe software for clinical note transcription with the patient, who gave verbal consent to proceed.  HPI : Jose Ware is a 89 y.o. male with a history of coronary artery disease/history of STEMI, history of stroke, prostate cancer, lymphoma recent diagnosis of bladder cancer who presents to clinic today to discuss surveillance colonoscopy.  He has a history of numerous colon polyps, including a large tubular adenoma identified in 2016, which has recurred in subsequent evaluations in 2019 and 2024.  His last colonoscopy was in January 2024, at which time a 15 mm polyp in the transverse colon was removed, likely correlating to the site of previous large polypectomy, as well as 6 other tubular adenomas.  He was recommended to consider repeat colonoscopy in 1 year.  Since that time, he has been diagnosed with aggressive bladder cancer and is being considered for cystectomy.  He is meeting with his urologist today to discuss this. He has been told that given his numerous abdominal surgeries and presumed scar tissue, that his surgery will likely be very difficult and high risk.  He reports a history of major colon surgery/colostomy, presumably related to diverticulitis.  Interestingly, no report of a surgical anastomosis was noted on his recent colonoscopies.  He is currently considering whether to proceed with bladder surgery due to concerns about surgical access related to extensive abdominal scarring from previous surgeries, including partial stomach and large intestine removal for diverticulitis. He has a history of prostate cancer and has undergone multiple surgeries, resulting in significant abdominal scarring. Previous surgical attempts were challenging due to this scarring.  He experiences significant leg pain, particularly in the right knee, which he attributes to past surgery where the entire meniscus was removed in the 1950s, leaving a large scar. This limits  his ability to stand or walk for extended periods.  He lives with his wife, who is 22 years old, and he manages his daily activities independently. His children live out of state, in [place] , [place] , and [place] .     Colonoscopy Jan 2024  - One 15 mm polyp in the transverse colon, removed piecemeal using a cold snare, located in between the 2 tatoos. Resected and retrieved.  - Two tattoos were seen in the transverse colon. The tattoo sites appeared normal.  - Six 7 to 10 mm polyps in the rectum, in the sigmoid colon, in the descending colon and in the transverse colon, removed with a cold snare. Resected and retrieved.  - Diverticulosis in the left colon.  - Internal hemorrhoids.  - The examination was otherwise normal on direct and retroflexion views.  FINAL MICROSCOPIC DIAGNOSIS:   A. COLON, POLYPECTOMY:  - Tubular adenoma(s) without high-grade dysplasia or malignancy  - Hyperplastic polyp(s)    Colonoscopy 2019 - A tattoo was seen in the transverse colon.  - One 16 mm polyp in the transverse colon, removed with a cold snare. Resected and retrieved.  - Five 6 to 9 mm polyps in the transverse colon, in the ascending colon and in the cecum, removed with a cold snare. Resected and retrieved.  - One 4 mm polyp in the ascending colon, removed with a cold biopsy forceps. Resected and retrieved.  - Two small cecal angiodysplasias.  - Left colon diverticulosis.  - Internal hemorrhoids. - The examination was otherwise normal on direct and retroflexion views.  1. Surgical [P], cecum, ascending, polyp (4) - TUBULAR ADENOMA(S). - HIGH GRADE DYSPLASIA IS NOT IDENTIFIED. 2. Surgical [P], transverse,  polyp (2) - TUBULAR ADENOMA(S). - HIGH GRADE DYSPLASIA IS NOT IDENTIFIED. 3. Surgical [P], transverse, polyp - TUBULAR ADENOMA(S). - HIGH GRADE DYSPLASIA IS NOT IDENTIFIED.   Colonoscopy June 2017 Dr. Aneita - One 15 mm polyp in the transverse colon, removed piecemeal using a hot  snare. Resected and retrieved. Injected.  - Two tattoos were noted in the transverse colon.  - One 6 mm polyp in the proximal transverse colon, removed with a cold snare. Resected and retrieved.  - Mild diverticulosis in the sigmoid colon.   Colonoscopy Nov 2016 Dr. Aneita COLON FINDINGS: A sessile polyp measuring 25 mm in size was found in the transverse colon. It was located behind a fold at a sharp turn in the colon. A polypectomy was performed in a piecemeal fashion using snare cautery. The resection appeared to be complete, the polyp tissue was completely retrieved and sent to histology. Injection (tattooing) was performed proximal and distal to the tattoo. A sessile polyp measuring 6 mm in size was found at the cecum. A polypectomy was performed with a cold snare. The resection was complete, the polyp tissue was completely retrieved and sent to histology. A sessile polyp measuring 4 mm in size was found in the ascending colon. A polypectomy was performed with cold forceps. The resection was complete, the polyp tissue was completely retrieved and sent to histology. There was mild diverticulosis noted in the sigmoid colon. The examination was otherwise normal. Retroflexed views revealed internal Grade I hemorrhoids. The time to cecum = 1.5 Withdrawal time = 26.0 The scope was withdrawn and the procedure completed. COMPLICATIONS: There were no immediate complications.   ENDOSCOPIC IMPRESSION: 1. Sessile polyp in the transverse colon; polypectomy performed in a piecemeal fashion using snare cautery; Injection (tattooing) performed 2. Sessile polyp at the cecum; polypectomy was performed with a cold snare 3. Sessile polyp in the ascending colon; polypectomy was performed with cold forceps 4. Mild diverticulosis in the sigmoid colon 5. Grade l internal hemorrhoids   Colonoscopy April 04, 2009 Dr. Aneita   ENDOSCOPIC IMPRESSION:     1) 3 mm sessile polyp at the ileocecal valve     2) 4 mm sessile polyp  in the sigmoid colon     3) 6 mm sessile polyp     4) Mild diverticulosis in the sigmoid colon     5) Radiation proctitis     6) Internal hemorrhoids   1. Surgical [P], cecum, ascending, polyp (2) - TUBULAR ADENOMA(X1). - SESSILE SERRATED POLYP WITHOUT CYTOLOGIC DYSPLASIA. - HIGH GRADE DYSPLASIA IS NOT IDENTIFIED. 2. Surgical [P], transverse, polyp - TUBULOVILLOUS ADENOMA. - HIGH GRADE DYSPLASIA IS NOT IDENTIFIED.  Past Medical History:  Diagnosis Date   Adenocarcinoma of prostate [facility])    XRT & Radiation seed implantation in 2010   Adenomatous colon polyp    Anemia    CAD (coronary artery disease)    a. 11/04/15:  Acute inferolateral STEMI: S/p emergent DES of a very large LCx with extensive thrombus. Significant residual disease in the proximal LAD and distal RCA. S/p staged PCI of LAD and RCA 8/29.   Cataract    CHF (congestive heart failure) (HCC)    CVA (cerebral infarction)    a. 10/2015: embolic CVA after heart cath    DIVERTICULITIS, HX OF 11/27/2006        DJD (degenerative joint disease)    wrist - R    Hernia    History of blood transfusion 1985   with colon surgery  History of kidney stones    Hypertension    Ischemic cardiomyopathy    a. EF 25-30% by LV gram on cath 11/04/15. Appeared out of proportion to infarct although infarct was large. EF improved by Echo and now 40-45%.   Myocardial infarction (HCC) 2019   NEPHROLITHIASIS, HX OF 11/27/2006         Non Hodgkin's lymphoma (HCC) 11/2020   watching at this point, in abdomen, no meds or chemo yet as of 01-27-2022   Peripheral neuropathy    pt denies   Pneumonia    as a child   Psoriasis    Sleep apnea    wears cpap   Stroke [facility])    no residuals   Tenosynovitis      Past Surgical History:  Procedure Laterality Date   APPENDECTOMY  03/11/1983   BACK SURGERY  03/10/1978   minor disk surgery: instrumentation placed and removed in a second procedure   CARDIAC CATHETERIZATION N/A 11/04/2015    Procedure: Left Heart Cath and Coronary Angiography;  Surgeon: Peter M Jordan, MD;  Location: [facility] INVASIVE CV LAB;  Service: Cardiovascular;  Laterality: N/A;   CARDIAC CATHETERIZATION N/A 11/04/2015   Procedure: Coronary Stent Intervention;  Surgeon: Peter M Jordan, MD;  Location: [facility] INVASIVE CV LAB;  Service: Cardiovascular;  Laterality: N/A;   CARDIAC CATHETERIZATION N/A 11/06/2015   Procedure: Coronary Stent Intervention;  Surgeon: Alm LELON Clay, MD;  Location: [facility] INVASIVE CV LAB;  Service: Cardiovascular;  Laterality: N/A;   CATARACT EXTRACTION, BILATERAL  03/11/2011   CHOLECYSTECTOMY  03/10/1984   COLON SURGERY  11/09/1983   pt. had ileus, had colon surgery, requiring colostomy & then reversal & then dehisence of that wound & return to OR for repair & cholecystectomy     COLONOSCOPY  2019   COLONOSCOPY WITH PROPOFOL  N/A 03/13/2022   Procedure: COLONOSCOPY WITH PROPOFOL ;  Surgeon: Aneita Gwendlyn DASEN, MD;  Location: [facility];  Service: Gastroenterology;  Laterality: N/A;   COLOSTOMY  03/11/1983   After colectomy for diverticulitis, pt. remarks during this surgery they gave me the paddles two times  because of bleeding, pt. states it was unrelated to any anesthesia complication   CYSTOSCOPY WITH BIOPSY N/A 03/06/2023   Procedure: CYSTOSCOPY WITH BIOPSY;  Surgeon: Nieves Cough, MD;  Location: [facility];  Service: Urology;  Laterality: N/A;  49 MINUTES FOR CASE   EYE SURGERY     /w IOL   KNEE ARTHROSCOPY Left 03/10/2001   KNEE SURGERY Left 03/10/1954   Left 1956, Right w/cartilage removed later   PARATHYROIDECTOMY N/A 05/12/2014   Procedure: PARATHYROIDECTOMY;  Surgeon: Krystal Spinner, MD;  Location: [facility] OR;  Service: General;  Laterality: N/A;   POLYPECTOMY     POLYPECTOMY  03/13/2022   Procedure: POLYPECTOMY;  Surgeon: Aneita Gwendlyn DASEN, MD;  Location: [facility];  Service: Gastroenterology;;   REVERSAL OF COLOSTOMY  03/10/1985   TONSILLECTOMY  03/10/1944   TRANSURETHRAL RESECTION OF  BLADDER TUMOR N/A 03/06/2023   Procedure: TRANSURETHRAL RESECTION OF BLADDER TUMOR (TURBT);  Surgeon: Nieves Cough, MD;  Location: [facility];  Service: Urology;  Laterality: N/A;  2-5 cm   WRIST SURGERY Left    Remote complicated w/infection   Family History  Problem Relation Age of Onset   Coronary artery disease Mother    Alcohol abuse Father    Cirrhosis Father    Heart failure Sister    Breast cancer Sister        W/involvement of right arm leading to amputation  Anemia Sister        related to treatment to lymphoma   Non-Hodgkin's lymphoma Sister    Other Sister        died from covid 03/13/21   Coronary artery disease Brother    Heart attack Brother    Non-Hodgkin's lymphoma Brother    Clotting disorder Brother    Non-Hodgkin's lymphoma Brother    Prostate cancer Neg Hx    Colon cancer Neg Hx    Diabetes Neg Hx    Glaucoma Neg Hx    Rectal cancer Neg Hx    Esophageal cancer Neg Hx    Colon polyps Neg Hx    Stomach cancer Neg Hx    Social History   Tobacco Use   Smoking status: Former    Current packs/day: 0.00    Average packs/day: 2.0 packs/day for 30.0 years (60.0 ttl pk-yrs)    Types: Cigarettes    Start date: 03/10/1953    Quit date: 03/11/1983    Years since quitting: 40.1   Smokeless tobacco: Never   Tobacco comments:    started smoking in the service; ICU 1984-03-13' part of his stomach; appendix; coma   Vaping Use   Vaping status: Never Used  Substance Use Topics   Alcohol use: Not Currently    Comment: RARE, maybe one drink a couple times a year   Drug use: Never   Current Outpatient Medications  Medication Sig Dispense Refill   acetaminophen  (TYLENOL ) 500 MG tablet Take 500 mg by mouth every 6 (six) hours as needed for moderate pain (pain score 4-6).     amLODipine  (NORVASC ) 5 MG tablet Take 1 tablet by mouth once daily 90 tablet 0   aspirin  EC 81 MG tablet Take 1 tablet (81 mg total) by mouth daily. 90 tablet 1   atorvastatin  (LIPITOR ) 10 MG tablet Take 1  tablet (10 mg total) by mouth every other day. 45 tablet 3   carvedilol  (COREG ) 3.125 MG tablet Take 1 tablet by mouth twice daily 180 tablet 2   Cyanocobalamin  (VITAMIN B-12) 5000 MCG TBDP Take 5,000 mcg by mouth every morning.     docusate sodium  (COLACE) 100 MG capsule Take 100 mg by mouth 2 (two) times daily.     furosemide  (LASIX ) 20 MG tablet TAKE 1 TABLET BY MOUTH ONCE DAILY. TAKE 1 ADDITIONAL TABLET BY MOUTH FOR A WEIGHT GAIN OF 178 OR GREATER 60 tablet 5   Multiple Vitamins-Minerals (PRESERVISION AREDS 2) CAPS Take 1 capsule by mouth 2 (two) times daily.     nitroGLYCERIN  (NITROSTAT ) 0.4 MG SL tablet Place 1 tablet (0.4 mg total) under the tongue every 5 (five) minutes x 3 doses as needed for chest pain. 25 tablet 2   spironolactone  (ALDACTONE ) 25 MG tablet Take 25 mg by mouth daily. Per cardiology     No current facility-administered medications for this visit.   Allergies  Allergen Reactions   Bee Venom Swelling    Facial swelling   Betadine [Povidone Iodine] Other (See Comments)    blistering   Doxycycline      Possible drug rash- back of right lower leg and onto left lower leg as well     Review of Systems: All systems reviewed and negative except where noted in HPI.    No results found.  Physical Exam: BP 122/80   Pulse 82   Ht 6' 1 (1.854 m)   Wt 197 lb (89.4 kg)   BMI 25.99 kg/m  Constitutional: Pleasant,well-developed, elderly Caucasian  male in no acute distress.  Uses walker for ambulation HEENT: Normocephalic and atraumatic. Conjunctivae are normal. No scleral icterus. Neurological: Alert and oriented to person place and time. Skin: Skin is warm and dry. No rashes noted. Psychiatric: Normal mood and affect. Behavior is normal.  CBC    Component Value Date/Time   WBC 6.6 02/11/2023 1005   WBC 8.0 09/21/2022 1903   RBC 3.74 (L) 02/11/2023 1005   HGB 12.3 (L) 02/11/2023 1005   HGB 13.5 05/06/2018 1103   HCT 37.4 (L) 02/11/2023 1005   HCT 38.7  05/06/2018 1103   PLT 204 02/11/2023 1005   PLT 259 05/06/2018 1103   MCV 100.0 02/11/2023 1005   MCV 92 05/06/2018 1103   MCH 32.9 02/11/2023 1005   MCHC 32.9 02/11/2023 1005   RDW 14.0 02/11/2023 1005   RDW 14.8 05/06/2018 1103   LYMPHSABS 0.6 (L) 02/11/2023 1005   LYMPHSABS 1.0 05/06/2018 1103   MONOABS 0.7 02/11/2023 1005   EOSABS 0.1 02/11/2023 1005   EOSABS 0.2 05/06/2018 1103   BASOSABS 0.0 02/11/2023 1005   BASOSABS 0.0 05/06/2018 1103    CMP     Component Value Date/Time   NA 142 02/11/2023 1005   NA 140 05/06/2018 1103   K 3.9 02/11/2023 1005   CL 105 02/11/2023 1005   CO2 32 02/11/2023 1005   GLUCOSE 107 (H) 02/11/2023 1005   BUN 32 (H) 02/11/2023 1005   BUN 27 05/06/2018 1103   CREATININE 1.17 02/11/2023 1005   CREATININE 1.10 12/23/2019 1204   CALCIUM  9.2 02/11/2023 1005   PROT 6.2 (L) 02/11/2023 1005   PROT 6.1 05/06/2018 1103   ALBUMIN 3.7 02/11/2023 1005   ALBUMIN 3.9 05/06/2018 1103   AST 11 (L) 02/11/2023 1005   ALT 9 02/11/2023 1005   ALKPHOS 65 02/11/2023 1005   BILITOT 1.0 02/11/2023 1005   GFRNONAA >60 02/11/2023 1005   GFRNONAA 62 12/23/2019 1204   GFRAA 72 12/23/2019 1204       Latest Ref Rng & Units 02/11/2023   10:05 AM 09/21/2022    7:03 PM 08/06/2022   10:43 AM  CBC EXTENDED  WBC 4.0 - 10.5 K/uL 6.6  8.0  7.7   RBC 4.22 - 5.81 MIL/uL 3.74  3.50  3.78   Hemoglobin 13.0 - 17.0 g/dL 87.6  88.5  87.4   HCT 39.0 - 52.0 % 37.4  34.7  37.0   Platelets 150 - 400 K/uL 204  181  207   NEUT# 1.7 - 7.7 K/uL 5.1  6.1  5.4   Lymph# 0.7 - 4.0 K/uL 0.6  0.9  1.2       ASSESSMENT AND PLAN:  89 year old male with numerous medical problems, including recent diagnosis of bladder cancer, with history of numerous polyps, including a recurrent large polyp in the transverse colon, initially removed in 2016.  Reassuringly, none of his polyps have had advanced histology since he had initial large polyp was removed in 2016 (initial path was tubulovillous  adenoma).   We discussed how he is at increased risk for colon cancer given his propensity to form polyps however, given his last colonoscopy being a year ago, his risk for colon cancer within the next year or even few years is quite low.  He has a much more significant medical problems with his bladder cancer, and he would wish to focus on this without scheduling a colonoscopy first.  I think this is very reasonable.  We can easily put off  his colonoscopy for later this year, or even next year without significantly increasing his risk for colon cancer.   Colonic Polyps Multiple colonic polyps, including a large tubular adenoma identified in 2016 and noted in subsequent colonoscopies in 2019 and 2024. Polyps are non-aggressive and do not exhibit features close to malignancy. At risk for colon cancer due to the propensity for precancerous polyps, but progression is slow. No urgency for another colonoscopy; acceptable to postpone until the end of the year. - Postpone colonoscopy for now - Reassess the large polyp and remove any new polyps during the next colonoscopy  Bladder Cancer Considering bladder resection surgery. Extensive abdominal scarring makes the surgery high-risk. Meeting with urologist to discuss feasibility and risks. Undecided about proceeding with surgery. Previous advice indicated surgery could be life-threatening and not very promising due to scarring.  - Discuss surgical options and risks with urologist  History of coronary artery disease Patient met with cardiologist who is recommended a stress test if the patient does decide to proceed with surgery  Hayden Kihara E. Stacia, MD [facility]  I spent a total of 35 minutes reviewing the patient's medical record, interviewing and examining the patient, discussing his diagnosis and management of his condition going forward, and documenting in the medical record    Katrinka [name], MD

## 2023-04-16 NOTE — Patient Instructions (Addendum)
 _______________________________________________________  If your blood pressure at your visit was 140/90 or greater, please contact your primary care physician to follow up on this.  _______________________________________________________  If you are age 87 or older, your body mass index should be between 23-30. Your Body mass index is 25.99 kg/m. If this is out of the aforementioned range listed, please consider follow up with your Primary Care Provider.  If you are age 26 or younger, your body mass index should be between 19-25. Your Body mass index is 25.99 kg/m. If this is out of the aformentioned range listed, please consider follow up with your Primary Care Provider.   ________________________________________________________  The [facility] providers would like to encourage you to use MYCHART to communicate with providers for non-urgent requests or questions.  Due to long hold times on the telephone, sending your provider a message by [facility] may be a faster and more efficient way to get a response.  Please allow 48 business hours for a response.  Please remember that this is for non-urgent requests.  _______________________________________________________  It has been recommended to you by your physician that you have a(n) colonoscopy completed. Per your request, we did not schedule the procedure(s) today. Please contact our office at [phone] should you decide to have the procedure completed.   It was a pleasure to see you today!  Thank you for trusting me with your gastrointestinal care!

## 2023-04-18 ENCOUNTER — Other Ambulatory Visit: Payer: Self-pay | Admitting: Cardiology

## 2023-04-21 DIAGNOSIS — C672 Malignant neoplasm of lateral wall of bladder: Secondary | ICD-10-CM | POA: Diagnosis not present

## 2023-04-21 DIAGNOSIS — C679 Malignant neoplasm of bladder, unspecified: Secondary | ICD-10-CM | POA: Diagnosis not present

## 2023-05-11 DIAGNOSIS — R310 Gross hematuria: Secondary | ICD-10-CM | POA: Diagnosis not present

## 2023-05-11 DIAGNOSIS — N281 Cyst of kidney, acquired: Secondary | ICD-10-CM | POA: Diagnosis not present

## 2023-05-11 DIAGNOSIS — C672 Malignant neoplasm of lateral wall of bladder: Secondary | ICD-10-CM | POA: Diagnosis not present

## 2023-05-27 ENCOUNTER — Telehealth: Payer: Self-pay

## 2023-05-27 NOTE — Telephone Encounter (Signed)
 Hi Jose Ware,  I called and spoke with the pt as Dr. Durene Cal does not handle his OSA and he could not remember who he saw in 2024. So I looked back and he saw Jose Dura, NP. Could you please follow up with the below company and submit what they are requesting please? Thank you.  Copied from CRM [id number]. Topic: Clinical - Request for Lab/Test Order >> May 27, 2023 10:46 AM Ernst Spell wrote: Reason for CRM: Asten from [facility] health called to request for written orders along with the patient sleep study and OV notes. Please call and advise at [phone]. Fax: [phone].

## 2023-05-27 NOTE — Telephone Encounter (Signed)
 Routing encounter to [employer] triage pool and also to Rudy.

## 2023-05-30 NOTE — Progress Notes (Unsigned)
 HPI- Past Medical History:  Prostate cancer, Colon polyps, CAD, Cataract, CVA, Diverticulitis, DJD, HTN, Ischemic CM, CVA, Nephrolithiasis, Neuropathy, Psoriasis, Follicular Lymphoma, Bladder Cancer,   Past Surgical History:  He  has a past surgical history that includes Cholecystectomy (1986); REVERSAL OF COLOSTOMY (1987); Wrist surgery (Left); Colostomy (1985); Knee arthroscopy (Left, 2003); Appendectomy (1985); Tonsillectomy (1946); Knee surgery (1956); Cataract extraction, bilateral (2013); Back surgery (1980); Eye surgery; Colon surgery (1980's); Parathyroidectomy (N/A, 05/12/2014); Colonoscopy; Cardiac catheterization (N/A, 11/04/2015); Cardiac catheterization (N/A, 11/04/2015); and Cardiac catheterization (N/A, 11/06/2015). Sleep Tests:  PSG 01/09/10 >> AHI 1.5 HST 04/11/20 >> AHI 24.4, SpO2 low 82% Auto CPAP 12/15/21 to 01/13/22 >> used on 29 of 30 nights with average 6 hrs 44 min.  Average AHI 5.1 with median CPAP 9 and 95 th percentile CPAP 13 cm H2O   Cardiac Tests:  Echo 07/29/21 >> mild/mod LV systolic dysfx, mild LVH, severe LA dilation, mild MR ==============================================================================================================  06/01/23- 89yoM previously followed by Dr Craige Cotta, LOV 01/15/22- OSA CPAP auto 5-15/ [employer]  05/08/20   AirSense 11 Download compliance-100%, AHI 10.1/hr   (range 9.5-14.1)   centrals> obstructive Body weight today- 203 lbs Pending BCG treatments for uroepithelial Ca. Back and hip pains limit standing/ walking Busy brain interferes with sleep. Aware AHI drifting up over past year. Discussed the use of AI scribe software for clinical note transcription with the patient, who gave verbal consent to proceed.  History of Present Illness   The patient, with a history of sleep apnea, presents for a follow-up visit. He reports no improvement in his symptoms despite using a CPAP machine. He expresses dissatisfaction with the machine, but notes that  his partner appreciates the reduction in snoring. The patient has noticed an increase in his apnea-hypopnea index (AHI), with only two days in the past couple of weeks where it was below ten. We discussed component of central apneas, where his brain doesn't signal him to take a breath, suggesting a possible circulatory issue.  In addition to his sleep apnea, the patient has experienced significant weight loss, from 240 pounds to 167 pounds, due to a hospitalization for blockage. He is slowly regaining weight, currently at 190 pounds. He also reports difficulty sleeping due to an active mind, and has a tendency to fall asleep during inactive periods, a symptom that started about a year ago.  The patient also reports chronic back pain that limits his ability to walk or stand for long periods. This pain is suspected to be due to his bladder issues, for which he is starting treatments. He has a history of multiple surgeries, including partial stomach and large intestine removal, which has resulted in significant scarring.     ROS-see HPI   + = positive Constitutional:    weight loss, night sweats, fevers, chills, fatigue, lassitude. HEENT:    headaches, difficulty swallowing, tooth/dental problems, sore throat,       sneezing, itching, ear ache, nasal congestion, post nasal drip, snoring CV:    chest pain, orthopnea, PND, swelling in lower extremities, anasarca,                                   dizziness, palpitations Resp:   shortness of breath with exertion or at rest.                productive cough,   non-productive cough, coughing up of blood.  change in color of mucus.  wheezing.   Skin:    rash or lesions. GI:  No-   heartburn, indigestion, abdominal pain, nausea, vomiting, diarrhea,                 change in bowel habits, loss of appetite GU: dysuria, change in color of urine, no urgency or frequency.   flank pain. MS:   joint pain, stiffness, decreased range of motion, back  pain. Neuro-     nothing unusual Psych:  change in mood or affect.  depression or anxiety.   memory loss.  OBJ- Physical Exam General- Alert, Oriented, Affect-appropriate, Distress- none acute   +rolling walker Skin- rash-none, lesions- none, excoriation- none Lymphadenopathy- none Head- atraumatic            Eyes- Gross vision intact, PERRLA, conjunctivae and secretions clear            Ears- Hearing, canals-normal            Nose- Clear, no-Septal dev, mucus, polyps, erosion, perforation             Throat- Mallampati II , mucosa clear , drainage- none, tonsils- atrophic Neck- flexible , trachea midline, no stridor , thyroid nl, carotid no bruit Chest - symmetrical excursion , unlabored           Heart/CV- RRR , no murmur , no gallop  , no rub, nl s1 s2                           - JVD- none , edema- none, stasis changes- none, varices- none           Lung- clear to P&A, wheeze- none, cough- none , dullness-none, rub- none           Chest wall-  Abd-  Br/ Gen/ Rectal- Not done, not indicated Extrem- cyanosis- none, clubbing, none, atrophy- none, strength- nl Neuro- grossly intact to observation Assessment and Plan    Obstructive Sleep Apnea (OSA) AHI increased with some central apneas, indicating possible circulatory issue. Weight fluctuation noted. Persistent daytime sleepiness. - Adjust CPAP pressure to 20 cm H2O maximum. - Instruct him to report discomfort with new settings.  Back Pain Significant pain limiting mobility, related to gallbladder issues. Surgical intervention not feasible due to scarring. - Proceed with scheduled gallbladder treatments starting Thursday.  Bladder Cancer Undergoing BCG treatment with six sessions planned, followed by assessment. - Continue BCG treatment as scheduled. - Re-evaluate one month post-treatment.  Cardiac Arrhythmia Intermittent skipped beats and heart murmur under cardiologist care. - Continue follow-up with cardiologist.

## 2023-06-01 ENCOUNTER — Ambulatory Visit (INDEPENDENT_AMBULATORY_CARE_PROVIDER_SITE_OTHER): Payer: Medicare Other

## 2023-06-01 ENCOUNTER — Ambulatory Visit: Admitting: Internal Medicine

## 2023-06-01 ENCOUNTER — Encounter: Payer: Self-pay | Admitting: Internal Medicine

## 2023-06-01 VITALS — Ht 73.0 in | Wt 190.2 lb

## 2023-06-01 VITALS — BP 116/68 | HR 32 | Temp 97.5°F | Ht 73.0 in | Wt 203.2 lb

## 2023-06-01 DIAGNOSIS — Z0000 Encounter for general adult medical examination without abnormal findings: Secondary | ICD-10-CM

## 2023-06-01 DIAGNOSIS — G4733 Obstructive sleep apnea (adult) (pediatric): Secondary | ICD-10-CM | POA: Diagnosis not present

## 2023-06-01 NOTE — Progress Notes (Addendum)
 Subjective:   Jose Ware is a 89 y.o. who presents for a Medicare Wellness preventive visit.  Visit Complete: Virtual I connected with  Jose Ware on 06/01/23 by a audio enabled telemedicine application and verified that I am speaking with the correct person using two identifiers.  Patient Location: Home  Provider Location: Office/Clinic  I discussed the limitations of evaluation and management by telemedicine. The patient expressed understanding and agreed to proceed.  Vital Signs: Because this visit was a virtual/telehealth visit, some criteria may be missing or patient reported. Any vitals not documented were not able to be obtained and vitals that have been documented are patient reported.  VideoDeclined- This patient declined Librarian, academic. Therefore the visit was completed with audio only.  Persons Participating in Visit: Patient.  AWV Questionnaire: Yes: Patient Medicare AWV questionnaire was completed by the patient on 05/27/23; I have confirmed that all information answered by patient is correct and no changes since this date.  Cardiac Risk Factors include: advanced age (>33men, >52 women);dyslipidemia;hypertension;male gender     Objective:    Today's Vitals   05/27/23 0555 06/01/23 1043  Weight:  190 lb 3.2 oz (86.3 kg)  Height:  6\' 1"  (1.854 m)  PainSc: 7     Body mass index is 25.09 kg/m.     06/01/2023   10:48 AM 02/24/2023    9:08 AM 09/21/2022    8:24 PM 05/31/2022    5:00 PM 05/20/2022   10:40 AM 03/21/2022    3:28 AM 03/20/2022    8:48 PM  Advanced Directives  Does Patient Have a Medical Advance Directive? Yes Yes Yes Yes Yes  No  Type of Estate agent of [place];Living will Living will Living will Healthcare Power of [employer] Power of [place];Living will    Does patient want to make changes to medical advance directive?    No - Patient declined   No - Patient declined  Copy  of Healthcare Power of Attorney in Chart? No - copy requested   No - copy requested No - copy requested    Would patient like information on creating a medical advance directive?      No - Patient declined     Current Medications (verified) Outpatient Encounter Medications as of 06/01/2023  Medication Sig   acetaminophen (TYLENOL) 500 MG tablet Take 500 mg by mouth every 6 (six) hours as needed for moderate pain (pain score 4-6).   amLODipine (NORVASC) 5 MG tablet Take 1 tablet by mouth once daily   aspirin EC 81 MG tablet Take 1 tablet (81 mg total) by mouth daily.   atorvastatin (LIPITOR) 10 MG tablet Take 1 tablet (10 mg total) by mouth every other day.   carvedilol (COREG) 3.125 MG tablet Take 1 tablet by mouth twice daily   Cyanocobalamin (VITAMIN B-12) 5000 MCG TBDP Take 5,000 mcg by mouth every morning.   docusate sodium (COLACE) 100 MG capsule Take 100 mg by mouth 2 (two) times daily.   furosemide (LASIX) 20 MG tablet TAKE 1 TABLET BY MOUTH ONCE DAILY. TAKE 1 ADDITIONAL TABLET BY MOUTH FOR A WEIGHT GAIN OF 178 OR GREATER   Multiple Vitamins-Minerals (PRESERVISION AREDS 2) CAPS Take 1 capsule by mouth 2 (two) times daily.   nitroGLYCERIN (NITROSTAT) 0.4 MG SL tablet Place 1 tablet (0.4 mg total) under the tongue every 5 (five) minutes x 3 doses as needed for chest pain.   spironolactone (ALDACTONE) 25 MG tablet Take  1 tablet by mouth once daily   No facility-administered encounter medications on file as of 06/01/2023.    Allergies (verified) Bee venom, Betadine [povidone iodine], and Doxycycline   History: Past Medical History:  Diagnosis Date   Adenocarcinoma of prostate (HCC)    XRT & Radiation seed implantation in 2010   Adenomatous colon polyp    Anemia    CAD (coronary artery disease)    a. 11/04/15:  Acute inferolateral STEMI: S/p emergent DES of a very large LCx with extensive thrombus. Significant residual disease in the proximal LAD and distal RCA. S/p staged PCI of LAD  and RCA 8/29.   Cataract    CHF (congestive heart failure) (HCC)    CVA (cerebral infarction)    a. 10/2015: embolic CVA after heart cath    DIVERTICULITIS, HX OF 11/27/2006        DJD (degenerative joint disease)    wrist - R    Hernia    History of blood transfusion 1985   with colon surgery   History of kidney stones    Hypertension    Ischemic cardiomyopathy    a. EF 25-30% by LV gram on cath 11/04/15. Appeared out of proportion to infarct although infarct was large. EF improved by Echo and now 40-45%.   Myocardial infarction (HCC) 2019   NEPHROLITHIASIS, HX OF 11/27/2006         Non Hodgkin's lymphoma (HCC) 11/2020   watching at this point, in abdomen, no meds or chemo yet as of 01-27-2022   Peripheral neuropathy    pt denies   Pneumonia    as a child   Psoriasis    Sleep apnea    wears cpap   Stroke [facility])    no residuals   Tenosynovitis    Past Surgical History:  Procedure Laterality Date   APPENDECTOMY  03/11/1983   BACK SURGERY  03/10/1978   minor disk surgery: instrumentation placed and removed in a second procedure   CARDIAC CATHETERIZATION N/A 11/04/2015   Procedure: Left Heart Cath and Coronary Angiography;  Surgeon: Peter M Swaziland, MD;  Location: [facility] INVASIVE CV LAB;  Service: Cardiovascular;  Laterality: N/A;   CARDIAC CATHETERIZATION N/A 11/04/2015   Procedure: Coronary Stent Intervention;  Surgeon: Peter M Swaziland, MD;  Location: [facility] INVASIVE CV LAB;  Service: Cardiovascular;  Laterality: N/A;   CARDIAC CATHETERIZATION N/A 11/06/2015   Procedure: Coronary Stent Intervention;  Surgeon: Marykay Lex, MD;  Location: [facility] INVASIVE CV LAB;  Service: Cardiovascular;  Laterality: N/A;   CATARACT EXTRACTION, BILATERAL  03/11/2011   CHOLECYSTECTOMY  03/10/1984   COLON SURGERY  11/09/1983   pt. had ileus, had colon surgery, requiring colostomy & then reversal & then dehisence of that wound & return to OR for repair & cholecystectomy     COLONOSCOPY  2019    COLONOSCOPY WITH PROPOFOL N/A 03/13/2022   Procedure: COLONOSCOPY WITH PROPOFOL;  Surgeon: Meryl Dare, MD;  Location: [facility];  Service: Gastroenterology;  Laterality: N/A;   COLOSTOMY  03/11/1983   After colectomy for diverticulitis, pt. remarks during this surgery they "gave me the paddles two times  because of bleeding", pt. states it was unrelated to any anesthesia complication   CYSTOSCOPY WITH BIOPSY N/A 03/06/2023   Procedure: CYSTOSCOPY WITH BIOPSY;  Surgeon: Jerilee Field, MD;  Location: [facility] ORS;  Service: Urology;  Laterality: N/A;  58 MINUTES FOR CASE   EYE SURGERY     /w IOL   KNEE ARTHROSCOPY Left 03/10/2001  KNEE SURGERY Left 03/10/1954   Left 1956, Right w/cartilage removed later   PARATHYROIDECTOMY N/A 05/12/2014   Procedure: PARATHYROIDECTOMY;  Surgeon: Darnell Level, MD;  Location: [facility];  Service: General;  Laterality: N/A;   POLYPECTOMY     POLYPECTOMY  03/13/2022   Procedure: POLYPECTOMY;  Surgeon: Meryl Dare, MD;  Location: [facility];  Service: Gastroenterology;;   REVERSAL OF COLOSTOMY  03/10/1985   TONSILLECTOMY  03/10/1944   TRANSURETHRAL RESECTION OF BLADDER TUMOR N/A 03/06/2023   Procedure: TRANSURETHRAL RESECTION OF BLADDER TUMOR (TURBT);  Surgeon: Jerilee Field, MD;  Location: [facility];  Service: Urology;  Laterality: N/A;  2-5 cm   WRIST SURGERY Left    Remote complicated w/infection   Family History  Problem Relation Age of Onset   Coronary artery disease Mother    Alcohol abuse Father    Cirrhosis Father    Heart failure Sister    Breast cancer Sister        W/involvement of right arm leading to amputation   Anemia Sister        related to treatment to lymphoma   Non-Hodgkin's lymphoma Sister    Other Sister        died from covid 06/10/2020   Coronary artery disease Brother    Heart attack Brother    Non-Hodgkin's lymphoma Brother    Clotting disorder Brother    Non-Hodgkin's lymphoma Brother    Liver disease Neg Hx    Colon  cancer Neg Hx    Esophageal cancer Neg Hx    Social History   Socioeconomic History   Marital status: Married    Spouse name: Not on file   Number of children: 3   Years of education: Not on file   Highest education level: Bachelor's degree (e.g., BA, AB, BS)  Occupational History   Not on file  Tobacco Use   Smoking status: Former    Current packs/day: 0.00    Average packs/day: 2.0 packs/day for 30.0 years (60.0 ttl pk-yrs)    Types: Cigarettes    Start date: 03/10/1953    Quit date: 03/11/1983    Years since quitting: 40.2   Smokeless tobacco: Never   Tobacco comments:    started smoking in the service; ICU 20' part of his stomach; appendix; coma   Vaping Use   Vaping status: Never Used  Substance and Sexual Activity   Alcohol use: Not Currently    Comment: RARE, maybe one drink a couple times a year   Drug use: Never   Sexual activity: Not Currently  Other Topics Concern   Not on file  Social History Narrative   [employer], [place].Married 71 - Divorced June 11, 1994; remarried '03 different wife3 sons - '63, '65, '67Grandchildren -14; 1 great grand daughter; 4 great grands on wife's side, 1 great great granddaughter      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      Hobbies: fishing, busy volunteering   Social Drivers of Health   Financial Resource Strain: Low Risk  (06/01/2023)   Overall Financial Resource Strain (CARDIA)    Difficulty of Paying Living Expenses: Not hard at all  Food Insecurity: No Food Insecurity (06/01/2023)   Hunger Vital Sign    Worried About Running Out of Food in the Last Year: Never true    Ran Out of Food in the Last Year: Never true  Transportation Needs: No Transportation Needs (06/01/2023)   PRAPARE - Transportation  Lack of Transportation (Medical): No    Lack of Transportation (Non-Medical): No  Physical Activity: Insufficiently Active (06/01/2023)   Exercise Vital Sign    Days of Exercise per Week: 3 days     Minutes of Exercise per Session: 10 min  Stress: No Stress Concern Present (06/01/2023)   [employer] of Occupational Health - Occupational Stress Questionnaire    Feeling of Stress : Only a little  Social Connections: Socially Integrated (06/01/2023)   Social Connection and Isolation Panel [NHANES]    Frequency of Communication with Friends and Family: Three times a week    Frequency of Social Gatherings with Friends and Family: More than three times a week    Attends Religious Services: More than 4 times per year    Active Member of Clubs or Organizations: Yes    Attends Banker Meetings: 1 to 4 times per year    Marital Status: Married    Tobacco Counseling Counseling given: Not Answered Tobacco comments: started smoking in the service; ICU 85' part of his stomach; appendix; coma     Clinical Intake:  Pre-visit preparation completed: Yes  Pain : 0-10 Pain Score: 7  Pain Type: Chronic pain Pain Location: Back Pain Descriptors / Indicators: Aching Pain Onset: More than a month ago Pain Frequency: Intermittent     BMI - recorded: 25.09 Nutritional Status: BMI 25 -29 Overweight Diabetes: No  Lab Results  Component Value Date   HGBA1C 5.3 11/04/2015     How often do you need to have someone help you when you read instructions, pamphlets, or other written materials from your doctor or pharmacy?: 1 - Never  Interpreter Needed?: No  Information entered by :: Lanier Ensign, LPN   Activities of Daily Living     05/27/2023    5:55 AM 02/24/2023    9:09 AM  In your present state of health, do you have any difficulty performing the following activities:  Hearing? 0   Vision? 0   Difficulty concentrating or making decisions? 0   Walking or climbing stairs? 1   Dressing or bathing? 0   Doing errands, shopping? 0 0  Preparing Food and eating ? N   Using the Toilet? N   In the past six months, have you accidently leaked urine? Y   Comment wear a  pad   Do you have problems with loss of bowel control? Y   Managing your Medications? N   Managing your Finances? N   Housekeeping or managing your Housekeeping? Y     Patient Care Team: Shelva Majestic, MD as PCP - General (Family Medicine) Swaz[place], Peter M, MD as PCP - Cardiology (Cardiology) Su Grand, MD (Inactive) (Urology) Meryl Dare, MD (Inactive) (Gastroenterology) [place], Peter M, MD as Consulting Physician (Cardiology) Marzella Schlein., MD as Consulting Physician (Ophthalmology) Jerilee Field, MD as Consulting Physician (Urology) Erroll Luna, [facility] (Inactive) as Pharmacist (Pharmacist)  Indicate any recent Medical Services you may have received from other than [facility] providers in the past year (date may be approximate).     Assessment:   This is a routine wellness examination for Jose Ware.  Hearing/Vision screen Hearing Screening - Comments:: Pt denies hearing issues  Vision Screening - Comments:: Pt follows up with Dr Candise Che for annual eye exam   Goals Addressed             This Visit's Progress    Patient Stated       Maintain health and  activity        Depression Screen     06/01/2023   10:48 AM 12/04/2022   11:19 AM 06/11/2022   10:46 AM 05/20/2022   10:39 AM 04/04/2022    9:08 AM 05/17/2021   10:15 AM 12/27/2020   10:38 AM  PHQ 2/9 Scores  PHQ - 2 Score 0 0 0 1 2 0 0  PHQ- 9 Score     5      Fall Risk     05/27/2023    5:55 AM 01/01/2023   11:20 AM 05/20/2022   10:41 AM 04/04/2022    9:08 AM 05/17/2021   10:18 AM  Fall Risk   Falls in the past year? 1 1 1  0 1  Number falls in past yr: 1 0 1 0 1  Injury with Fall? 0 1 0 0 0  Risk for fall due to : Impaired balance/gait;Impaired mobility;History of fall(s) Impaired balance/gait Impaired vision;Impaired balance/gait;Impaired mobility History of fall(s) Impaired vision;Impaired balance/gait  Follow up Falls prevention discussed Falls prevention discussed Falls prevention discussed  Falls evaluation completed Falls prevention discussed    MEDICARE RISK AT HOME:  Medicare Risk at Home Any stairs in or around the home?: (Patient-Rptd) No Home free of loose throw rugs in walkways, pet beds, electrical cords, etc?: (Patient-Rptd) Yes Adequate lighting in your home to reduce risk of falls?: (Patient-Rptd) Yes Life alert?: (Patient-Rptd) No Use of a cane, walker or w/c?: (Patient-Rptd) Yes Grab bars in the bathroom?: (Patient-Rptd) Yes Shower chair or bench in shower?: (Patient-Rptd) Yes Elevated toilet seat or a handicapped toilet?: (Patient-Rptd) No  TIMED UP AND GO:  Was the test performed?  No  Cognitive Function: 6CIT completed    05/07/2017    9:11 AM  MMSE - Mini Mental State Exam  Not completed: --        06/01/2023   10:51 AM 05/20/2022   10:42 AM 05/17/2021   10:20 AM 05/10/2020   10:28 AM 04/04/2019   10:45 AM  6CIT Screen  What Year? 0 points 0 points 0 points 0 points 0 points  What month? 0 points 0 points 0 points 0 points 0 points  What time? 0 points 0 points 0 points  0 points  Count back from 20 0 points 0 points 0 points 0 points 0 points  Months in reverse 0 points 0 points 0 points 0 points 0 points  Repeat phrase 0 points 0 points 0 points 2 points 0 points  Total Score 0 points 0 points 0 points  0 points    Immunizations Immunization History  Administered Date(s) Administered   Fluad Quad(high Dose 65+) 12/17/2018, 12/23/2019, 12/25/2020, 11/20/2021   Influenza Split 01/06/2012   Influenza Whole 12/19/2008, 12/27/2009   Influenza, High Dose Seasonal PF 12/21/2012, 12/22/2014, 02/05/2017, 12/08/2017   Influenza,inj,Quad PF,6+ Mos 12/01/2013, 10/24/2015   Influenza-Unspecified 11/29/2022   Moderna Covid-19 Vaccine Bivalent Booster 16yrs & up 11/29/2022   PFIZER(Purple Top)SARS-COV-2 Vaccination 04/07/2019, 04/21/2019, 12/08/2019, 07/14/2020   Pfizer Covid-19 Vaccine Bivalent Booster 32yrs & up 12/29/2020, 11/20/2021    Pfizer(Comirnaty)Fall Seasonal Vaccine 12 years and older 12/12/2021   Pneumococcal Conjugate-13 09/14/2015   Pneumococcal Polysaccharide-23 02/12/2009   RSV,unspecified 12/12/2021   Td 03/12/2003   Tdap 02/27/2014   Zoster Recombinant(Shingrix) 11/24/2017, 01/26/2018    Screening Tests Health Maintenance  Topic Date Due   COVID-19 Vaccine (8 - 2024-25 season) 01/24/2023   Colonoscopy  03/14/2023   DTaP/Tdap/Td (3 - Td or Tdap) 02/28/2024  Medicare Annual Wellness (AWV)  05/31/2024   Pneumonia Vaccine 32+ Years old  Completed   INFLUENZA VACCINE  Completed   Zoster Vaccines- Shingrix  Completed   HPV VACCINES  Aged Out    Health Maintenance  Health Maintenance Due  Topic Date Due   COVID-19 Vaccine (8 - 2024-25 season) 01/24/2023   Colonoscopy  03/14/2023   Health Maintenance Items Addressed: See Nurse Notes  Additional Screening:  Vision Screening: Recommended annual ophthalmology exams for early detection of glaucoma and other disorders of the eye.  Dental Screening: Recommended annual dental exams for proper oral hygiene  Community Resource Referral / Chronic Care Management: CRR required this visit?  No   CCM required this visit?  No     Plan:     I have personally reviewed and noted the following in the patient's chart:   Medical and social history Use of alcohol, tobacco or illicit drugs  Current medications and supplements including opioid prescriptions. Patient is not currently taking opioid prescriptions. Functional ability and status Nutritional status Physical activity Advanced directives List of other physicians Hospitalizations, surgeries, and ER visits in previous 12 months Vitals Screenings to include cognitive, depression, and falls Referrals and appointments  In addition, I have reviewed and discussed with patient certain preventive protocols, quality metrics, and best practice recommendations. A written personalized care plan for  preventive services as well as general preventive health recommendations were provided to patient.     Marzella Schlein, LPN   1/61/0960   After Visit Summary: (MyChart) Due to this being a telephonic visit, the after visit summary with patients personalized plan was offered to patient via MyChart   Notes: Nothing significant to report at this time.

## 2023-06-01 NOTE — Patient Instructions (Signed)
 Order- DME [employer] please change autopap range to 5-20, continue mask of choice, humidifier, supplies, AirView/ card  Please call if we can help

## 2023-06-01 NOTE — Patient Instructions (Signed)
 Jose Ware , Thank you for taking time to come for your Medicare Wellness Visit. I appreciate your ongoing commitment to your health goals. Please review the following plan we discussed and let me know if I can assist you in the future.   Referrals/Orders/Follow-Ups/Clinician Recommendations: Each day, aim for 6 glasses of water, plenty of protein in your diet and try to get up and walk/ stretch every hour for 5-10 minutes at a time.    This is a list of the screening recommended for you and due dates:  Health Maintenance  Topic Date Due   COVID-19 Vaccine (8 - 2024-25 season) 01/24/2023   Colon Cancer Screening  03/14/2023   DTaP/Tdap/Td vaccine (3 - Td or Tdap) 02/28/2024   Medicare Annual Wellness Visit  05/31/2024   Pneumonia Vaccine  Completed   Flu Shot  Completed   Zoster (Shingles) Vaccine  Completed   HPV Vaccine  Aged Out    Advanced directives: (Copy Requested) Please bring a copy of your health care power of attorney and living will to the office to be added to your chart at your convenience. You can mail to [facility] [address] [place], [place] [address] or email to [email or web address]  Next Medicare Annual Wellness Visit scheduled for next year: Yes

## 2023-06-02 ENCOUNTER — Telehealth: Payer: Self-pay

## 2023-06-02 NOTE — Telephone Encounter (Signed)
 Hi Dr. Maple Hudson, this got routed to [place] (his pcp) but you handle his OSA.  Copied from CRM [id number]. Topic: Clinical - Request for Lab/Test Order >> May 27, 2023 10:46 AM Ernst Spell wrote: Reason for CRM: Asten from [facility] health called to request for written orders along with the patient sleep study and OV notes. Please call and advise at [phone]. Fax: [phone]. >> Jun 02, 2023  1:50 PM Almira Coaster wrote: Benita Gutter from [facility] health is calling to follow up on this request. Advised of the message that was left by Jama Flavors that patient will need an appointment prior to receiving equipment since last appointment was back in 2023.

## 2023-06-04 DIAGNOSIS — C672 Malignant neoplasm of lateral wall of bladder: Secondary | ICD-10-CM | POA: Diagnosis not present

## 2023-06-04 DIAGNOSIS — Z5111 Encounter for antineoplastic chemotherapy: Secondary | ICD-10-CM | POA: Diagnosis not present

## 2023-06-11 DIAGNOSIS — C672 Malignant neoplasm of lateral wall of bladder: Secondary | ICD-10-CM | POA: Diagnosis not present

## 2023-06-18 DIAGNOSIS — C672 Malignant neoplasm of lateral wall of bladder: Secondary | ICD-10-CM | POA: Diagnosis not present

## 2023-06-20 ENCOUNTER — Other Ambulatory Visit: Payer: Self-pay | Admitting: Family Medicine

## 2023-06-25 DIAGNOSIS — Z5111 Encounter for antineoplastic chemotherapy: Secondary | ICD-10-CM | POA: Diagnosis not present

## 2023-06-25 DIAGNOSIS — C672 Malignant neoplasm of lateral wall of bladder: Secondary | ICD-10-CM | POA: Diagnosis not present

## 2023-07-02 DIAGNOSIS — C672 Malignant neoplasm of lateral wall of bladder: Secondary | ICD-10-CM | POA: Diagnosis not present

## 2023-07-09 DIAGNOSIS — C672 Malignant neoplasm of lateral wall of bladder: Secondary | ICD-10-CM | POA: Diagnosis not present

## 2023-07-21 ENCOUNTER — Encounter: Payer: Self-pay | Admitting: Family Medicine

## 2023-07-21 ENCOUNTER — Ambulatory Visit (INDEPENDENT_AMBULATORY_CARE_PROVIDER_SITE_OTHER): Payer: Medicare Other | Admitting: Family Medicine

## 2023-07-21 ENCOUNTER — Ambulatory Visit: Payer: Self-pay | Admitting: Family Medicine

## 2023-07-21 VITALS — BP 110/80 | HR 68 | Temp 97.3°F | Ht 73.0 in | Wt 196.2 lb

## 2023-07-21 DIAGNOSIS — E538 Deficiency of other specified B group vitamins: Secondary | ICD-10-CM | POA: Diagnosis not present

## 2023-07-21 DIAGNOSIS — E663 Overweight: Secondary | ICD-10-CM | POA: Diagnosis not present

## 2023-07-21 DIAGNOSIS — Z0000 Encounter for general adult medical examination without abnormal findings: Secondary | ICD-10-CM | POA: Diagnosis not present

## 2023-07-21 DIAGNOSIS — C8293 Follicular lymphoma, unspecified, intra-abdominal lymph nodes: Secondary | ICD-10-CM

## 2023-07-21 DIAGNOSIS — Z131 Encounter for screening for diabetes mellitus: Secondary | ICD-10-CM | POA: Diagnosis not present

## 2023-07-21 DIAGNOSIS — E785 Hyperlipidemia, unspecified: Secondary | ICD-10-CM | POA: Diagnosis not present

## 2023-07-21 DIAGNOSIS — I5042 Chronic combined systolic (congestive) and diastolic (congestive) heart failure: Secondary | ICD-10-CM | POA: Diagnosis not present

## 2023-07-21 LAB — COMPREHENSIVE METABOLIC PANEL WITH GFR
ALT: 10 U/L (ref 0–53)
AST: 13 U/L (ref 0–37)
Albumin: 3.9 g/dL (ref 3.5–5.2)
Alkaline Phosphatase: 73 U/L (ref 39–117)
BUN: 32 mg/dL — ABNORMAL HIGH (ref 6–23)
CO2: 27 meq/L (ref 19–32)
Calcium: 9.2 mg/dL (ref 8.4–10.5)
Chloride: 103 meq/L (ref 96–112)
Creatinine, Ser: 1.16 mg/dL (ref 0.40–1.50)
GFR: 56.77 mL/min — ABNORMAL LOW (ref >60.00)
Glucose, Bld: 88 mg/dL (ref 70–99)
Potassium: 4 meq/L (ref 3.5–5.1)
Sodium: 140 meq/L (ref 135–145)
Total Bilirubin: 1 mg/dL (ref 0.2–1.2)
Total Protein: 6.6 g/dL (ref 6.0–8.3)

## 2023-07-21 LAB — CBC WITH DIFFERENTIAL/PLATELET
Basophils Absolute: 0 10*3/uL (ref 0.0–0.1)
Basophils Relative: 0.4 % (ref 0.0–3.0)
Eosinophils Absolute: 0.1 10*3/uL (ref 0.0–0.7)
Eosinophils Relative: 1.3 % (ref 0.0–5.0)
HCT: 37.7 % — ABNORMAL LOW (ref 39.0–52.0)
Hemoglobin: 12.5 g/dL — ABNORMAL LOW (ref 13.0–17.0)
Lymphocytes Relative: 10.4 % — ABNORMAL LOW (ref 12.0–46.0)
Lymphs Abs: 0.7 10*3/uL (ref 0.7–4.0)
MCHC: 33.2 g/dL (ref 30.0–36.0)
MCV: 96.3 fl (ref 78.0–100.0)
Monocytes Absolute: 0.8 10*3/uL (ref 0.1–1.0)
Monocytes Relative: 12.3 % — ABNORMAL HIGH (ref 3.0–12.0)
Neutro Abs: 5 10*3/uL (ref 1.4–7.7)
Neutrophils Relative %: 75.6 % (ref 43.0–77.0)
Platelets: 212 10*3/uL (ref 150.0–400.0)
RBC: 3.91 Mil/uL — ABNORMAL LOW (ref 4.22–5.81)
RDW: 15.2 % (ref 11.5–15.5)
WBC: 6.6 10*3/uL (ref 4.0–10.5)

## 2023-07-21 LAB — LIPID PANEL
Cholesterol: 91 mg/dL (ref 0–200)
HDL: 43.2 mg/dL (ref >39.00)
LDL Cholesterol: 38 mg/dL (ref 0–99)
NonHDL: 48.16
Total CHOL/HDL Ratio: 2
Triglycerides: 53 mg/dL (ref 0.0–149.0)
VLDL: 10.6 mg/dL (ref 0.0–40.0)

## 2023-07-21 LAB — HEMOGLOBIN A1C: Hgb A1c MFr Bld: 5.7 % (ref 4.6–6.5)

## 2023-07-21 LAB — VITAMIN B12: Vitamin B-12: >1500 pg/mL — ABNORMAL HIGH (ref 211–911)

## 2023-07-21 NOTE — Patient Instructions (Addendum)
 Please stop by lab before you go If you have mychart- we will send your results within 3 business days of us  receiving them.  If you do not have mychart- we will call you about results within 5 business days of us  receiving them.  *please also note that you will see labs on mychart as soon as they post. I will later go in and write notes on them- will say "notes from Dr. Arlene Ben"   Keep on [place] on!   Recommended follow up: Return in about 6 months (around 01/21/2024) for followup or sooner if needed.Schedule b4 you leave.

## 2023-07-21 NOTE — Progress Notes (Signed)
 Phone: [phone]   Subjective:  Patient presents today for their annual physical. Chief complaint-noted.   See problem oriented charting- ROS- full  review of systems was completed and negative  Per full ROS sheet completed by patient except for topics noted under acute/chronic concerns  The following were reviewed and entered/updated in epic: Past Medical History:  Diagnosis Date   Adenocarcinoma of prostate (HCC)    XRT & Radiation seed implantation in 2010   Adenomatous colon polyp    Anemia    CAD (coronary artery disease)    a. 11/04/15:  Acute inferolateral STEMI: S/p emergent DES of a very large LCx with extensive thrombus. Significant residual disease in the proximal LAD and distal RCA. S/p staged PCI of LAD and RCA 8/29.   Cataract    CHF (congestive heart failure) (HCC)    CVA (cerebral infarction)    a. 10/2015: embolic CVA after heart cath    DIVERTICULITIS, HX OF 11/27/2006        DJD (degenerative joint disease)    wrist - R    Hernia    History of blood transfusion 1985   with colon surgery   History of kidney stones    Hypertension    Ischemic cardiomyopathy    a. EF 25-30% by LV gram on cath 11/04/15. Appeared out of proportion to infarct although infarct was large. EF improved by Echo and now 40-45%.   Myocardial infarction (HCC) 2019   NEPHROLITHIASIS, HX OF 11/27/2006         Non Hodgkin's lymphoma (HCC) 11/2020   watching at this point, in abdomen, no meds or chemo yet as of 01-27-2022   Peripheral neuropathy    pt denies   Pneumonia    as a child   Psoriasis    Sleep apnea    wears cpap   Stroke [facility])    no residuals   Tenosynovitis    Patient Active Problem List   Diagnosis Date Noted   Chronic heart failure (HCC) 05/31/2022    Priority: High   Bigeminy 05/31/2022    Priority: High   Follicular lymphoma, unspecified, intra-abdominal lymph nodes (HCC) 04/11/2021    Priority: High   History of small bowel obstruction 11/13/2020     Priority: High   Coronary artery disease involving native coronary artery of native heart without angina pectoris     Priority: High   Ischemic cardiomyopathy     Priority: High   History of embolic stroke     Priority: High   History of primary hyperparathyroidism s/p parathyroidectomy (benign adenoma) 12/28/2013    Priority: High   Fatigue 12/01/2013    Priority: High   History of prostate cancer 12/14/2008    Priority: High   OSA on CPAP 05/08/2020    Priority: Medium    Spinal stenosis of lumbar region at multiple levels 04/30/2017    Priority: Medium    Dyslipidemia 11/16/2015    Priority: Medium    Hx of adenomatous colonic polyps 01/18/2015    Priority: Medium    B12 deficiency 09/17/2010    Priority: Medium    PSORIASIS 10/15/2007    Priority: Medium    Essential hypertension 11/27/2006    Priority: Medium    Onychomycosis 10/28/2017    Priority: Low   Post herpetic neuralgia 06/20/2017    Priority: Low   Drug reaction 02/05/2017    Priority: Low   Senile purpura (HCC) 02/05/2017    Priority: Low   History of ventricular  tachycardia 11/08/2015    Priority: Low   History of stomach cancer 12/01/2013    Priority: Low   Lower back pain 03/24/2013    Priority: Low   Bradycardia 09/23/2012    Priority: Low   Hereditary and idiopathic peripheral neuropathy 10/18/2007    Priority: Low   Osteoarthritis 11/27/2006    Priority: Low   NEPHROLITHIASIS, HX OF 11/27/2006    Priority: Low   Pain of back and left lower extremity 06/08/2019    Priority: 1.   Left knee pain 06/06/2019    Priority: 1.   Spondylosis 06/25/2017    Priority: 1.   Post-traumatic osteoarthritis of right wrist 04/25/2015    Priority: 1.   Past Surgical History:  Procedure Laterality Date   APPENDECTOMY  03/11/1983   BACK SURGERY  03/10/1978   minor disk surgery: instrumentation placed and removed in a second procedure   CARDIAC CATHETERIZATION N/A 11/04/2015   Procedure: Left Heart Cath  and Coronary Angiography;  Surgeon: Peter M Swaziland, MD;  Location[facility] INVASIVE CV LAB;  Service: Cardiovascular;  Laterality: N/A;   CARDIAC CATHETERIZATION N/A 11/04/2015   Procedure: Coronary Stent Intervention;  Surgeon: Peter M Swaziland, MD;  Location: [facility] INVASIVE CV LAB;  Service: Cardiovascular;  Laterality: N/A;   CARDIAC CATHETERIZATION N/A 11/06/2015   Procedure: Coronary Stent Intervention;  Surgeon: Arleen Lacer, MD;  Location: [facility] INVASIVE CV LAB;  Service: Cardiovascular;  Laterality: N/A;   CATARACT EXTRACTION, BILATERAL  03/11/2011   CHOLECYSTECTOMY  03/10/1984   COLON SURGERY  11/09/1983   pt. had ileus, had colon surgery, requiring colostomy & then reversal & then dehisence of that wound & return to OR for repair & cholecystectomy     COLONOSCOPY  2019   COLONOSCOPY WITH PROPOFOL  N/A 03/13/2022   Procedure: COLONOSCOPY WITH PROPOFOL ;  Surgeon: Asencion Blacksmith, MD;  Location: [facility];  Service: Gastroenterology;  Laterality: N/A;   COLOSTOMY  03/11/1983   After colectomy for diverticulitis, pt. remarks during this surgery they "gave me the paddles two times  because of bleeding", pt. states it was unrelated to any anesthesia complication   CYSTOSCOPY WITH BIOPSY N/A 03/06/2023   Procedure: CYSTOSCOPY WITH BIOPSY;  Surgeon: Christina Coyer, MD;  Location: [facility];  Service: Urology;  Laterality: N/A;  43 MINUTES FOR CASE   EYE SURGERY     /w IOL   KNEE ARTHROSCOPY Left 03/10/2001   KNEE SURGERY Left 03/10/1954   Left 1956, Right w/cartilage removed later   PARATHYROIDECTOMY N/A 05/12/2014   Procedure: PARATHYROIDECTOMY;  Surgeon: Oralee Billow, MD;  Location: [facility] OR;  Service: General;  Laterality: N/A;   POLYPECTOMY     POLYPECTOMY  03/13/2022   Procedure: POLYPECTOMY;  Surgeon: Asencion Blacksmith, MD;  Location: [facility];  Service: Gastroenterology;;   REVERSAL OF COLOSTOMY  03/10/1985   TONSILLECTOMY  03/10/1944   TRANSURETHRAL RESECTION OF BLADDER TUMOR N/A 03/06/2023    Procedure: TRANSURETHRAL RESECTION OF BLADDER TUMOR (TURBT);  Surgeon: Christina Coyer, MD;  Location: [facility];  Service: Urology;  Laterality: N/A;  2-5 cm   WRIST SURGERY Left    Remote complicated w/infection    Family History  Problem Relation Age of Onset   Coronary artery disease Mother    Alcohol abuse Father    Cirrhosis Father    Heart failure Sister    Breast cancer Sister        W/involvement of right arm leading to amputation   Anemia Sister  related to treatment to lymphoma   Non-Hodgkin's lymphoma Sister    Other Sister        died from covid 07/26/20   Coronary artery disease Brother    Heart attack Brother    Non-Hodgkin's lymphoma Brother    Clotting disorder Brother    Non-Hodgkin's lymphoma Brother    Liver disease Neg Hx    Colon cancer Neg Hx    Esophageal cancer Neg Hx     Medications- reviewed and updated Current Outpatient Medications  Medication Sig Dispense Refill   acetaminophen  (TYLENOL ) 500 MG tablet Take 500 mg by mouth every 6 (six) hours as needed for moderate pain (pain score 4-6).     amLODipine  (NORVASC ) 5 MG tablet Take 1 tablet by mouth once daily 90 tablet 0   aspirin  EC 81 MG tablet Take 1 tablet (81 mg total) by mouth daily. 90 tablet 1   carvedilol  (COREG ) 3.125 MG tablet Take 1 tablet by mouth twice daily 180 tablet 2   Cyanocobalamin  (VITAMIN B-12) 5000 MCG TBDP Take 5,000 mcg by mouth every morning.     docusate sodium  (COLACE) 100 MG capsule Take 100 mg by mouth 2 (two) times daily.     furosemide  (LASIX ) 20 MG tablet TAKE 1 TABLET BY MOUTH ONCE DAILY. TAKE 1 ADDITIONAL TABLET BY MOUTH FOR A WEIGHT GAIN OF 178 OR GREATER 60 tablet 5   Multiple Vitamins-Minerals (PRESERVISION AREDS 2) CAPS Take 1 capsule by mouth 2 (two) times daily.     nitroGLYCERIN  (NITROSTAT ) 0.4 MG SL tablet Place 1 tablet (0.4 mg total) under the tongue every 5 (five) minutes x 3 doses as needed for chest pain. 25 tablet 2   spironolactone  (ALDACTONE ) 25  MG tablet Take 1 tablet by mouth once daily 90 tablet 3   atorvastatin  (LIPITOR ) 10 MG tablet Take 1 tablet (10 mg total) by mouth every other day. 45 tablet 3   No current facility-administered medications for this visit.    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 Oropharynx normal Neck: no thyromegaly CV: RRR no murmurs rubs or gallops Lungs: CTAB no crackles, wheeze, rhonchi Abdomen: soft/nontender/nondistended/normal bowel sounds. No rebound or guarding.  Ext: minimal edema Skin: warm, dry Neuro: grossly normal, moves all extremities, PERRLA, walks with walker   Assessment and Plan  89 y.o. male presenting for annual physical.  Health Maintenance counseling: 1. Anticipatory guidance: Patient counseled regarding regular dental exams - q6 months, eye exams - at least yearly but can be more,  avoiding  smoking and second hand smoke , limiting alcohol to 2 beverages per day - doesn't drink, no illicit drugs .   2. Risk factor reduction:  Advised patient of need for regular exercise and diet rich and fruits and vegetables to reduce risk of heart attack and stroke.  Exercise-  last year 2-3 days a week- can't stand or walk far now but does his best- between back and knee issues and balance- using walker Diet/weight management-weight from last physical down 17 lbs- but got much lower in past with small bowel obstruction- got as low as 167 at home in past.  Wt Readings from Last 3 Encounters:  07/21/23 196 lb 3.2 oz (89 kg)  06/01/23 203 lb 3.2 oz (92.2 kg)  06/01/23 190 lb 3.2 oz (86.3 kg)   3. Immunizations/screenings/ancillary studies- up to date  Immunization History  Administered Date(s) Administered   Fluad Quad(high Dose 65+) 12/17/2018, 12/23/2019, 12/25/2020, 11/20/2021   Influenza Split 01/06/2012   Influenza Whole 12/19/2008, 12/27/2009   Influenza, High Dose Seasonal PF 12/21/2012, 12/22/2014, 02/05/2017, 12/08/2017   Influenza,inj,Quad PF,6+ Mos 12/01/2013, 10/24/2015   Influenza-Unspecified 11/29/2022   Moderna Covid-19 Vaccine Bivalent Booster 90yrs & up 11/29/2022   PFIZER(Purple Top)SARS-COV-2 Vaccination 04/07/2019, 04/21/2019, 12/08/2019, 07/14/2020   Pfizer Covid-19 Vaccine Bivalent Booster 28yrs & up 12/29/2020, 11/20/2021   Pfizer(Comirnaty)Fall Seasonal Vaccine 12 years and older 12/12/2021   Pneumococcal Conjugate-13 09/14/2015   Pneumococcal Polysaccharide-23 02/12/2009   RSV,unspecified 12/12/2021   Td 03/12/2003   Tdap 02/27/2014   Zoster Recombinant(Shingrix) 11/24/2017, 01/26/2018  4. Prostate cancer  follow up - this is being followed  by urology Dr. Derrick Fling - we will hold off today Lab Results  Component Value Date   PSA 0.00 (L) 01/09/2022   PSA 0.00 (L) 12/27/2020   PSA <0.04 12/23/2019   5. Colon cancer screening - 04/16/2023 notes from Dr. Cherryl Corona "We discussed how he is at increased risk for colon cancer given his propensity to form polyps however, given his last colonoscopy being a year ago, his risk for colon cancer within the next year or even few years is quite low.  He has a much more significant medical problems  with his bladder cancer, and he would wish to focus on this without scheduling a colonoscopy first.  I think this is very reasonable.  We can easily put off his colonoscopy for later this year, or even next year without significantly increasing his risk for colon cancer." 6. Skin cancer screening- has seen dermatology in last year. advised regular sunscreen use. Denies worrisome, changing, or new skin lesions.  7. Smoking associated screening (lung cancer screening, AAA screen 65-75, UA)- former smoker- quit in 1980s- no regular screening 8. STD screening - only active with wife  Status of chronic or acute concerns   #Non hodgkins lymphoma/follicular lymphoma- diagnosed 2022- monitoring with Dr. Rosaline Coma- follow up 02/11/23 and was told yearly   # Bladder neoplasm ultimately found to be bladder cancer- biopsy andTURBT with Dr. Derrick Fling December 2024 and in 2025 doing BCG treatment instead of surgery-taking palliative approach instead of curative with plan for repeat cystoscopy June 2025  -tolerating treatments well in 2025 but he's hoping can hold off on further treatments  # Ischemic LV dysfunction  # Congestive heart failure-hospitalization March 2024 with ejection fraction 40 to 45% and mild to moderate aortic valve stenosis #Hypertension S: medication: spironolactone  25 mg, Coreg  3.125 mg twice daily, amlodipine  5 mg, lasix  20 mg daily with extra dose if weight increases or  swelling increases- not needed lately -prior losartan  100 mg but held after  hospitalization march 2024 with worsening creatinine -no worsening swelling or weight gain  A/P: CHF appears euvolemic- continue current medications    #Hyperlipidemia/CAD status post PCI after STEMI August 2017 S: Patient is compliant with atorvastatin  10 mg every other day -He is also compliant with aspirin  81mg - prior was on Plavix  as well but had easy bruising/bleeding -no chest pain or shortness of breath  Lab Results  Component Value Date    CHOL 68 03/28/2022   HDL 20 (L) 03/28/2022   LDLCALC 37 03/28/2022   LDLDIRECT 33.0 06/25/2020   TRIG 56 03/28/2022   CHOLHDL 3.4 03/28/2022  A/P: coronary artery disease  asymptomatic continue current medications  Lipids at goal last year- update today  #OSA on CPAP- started sept 2022 follows with Dr. Matilde Son (Retiring- will see Eulas Hick, NP) - compliant     Other notes: 1.  History primary hyperparathyroidism- treated with parathyroid  surgery but will continue to monitor calcium  levels 2. B12 deficiency-takes over-the-counter B12 supplement-  5000 mcg daily  #Echo findings 07/29/21-  6. Aortic dilatation noted. There is borderline dilatation of the  ascending aorta and of the aortic root, measuring 38 mm. There is mild  dilatation of the aortic root and of the ascending aorta, measuring 39 mm.  Aortic Valve: AV is thickened, calcified with very mildly restricted  motion.  - on 01/15/23 this was updated on echocardiogram- EF 40-45% and they didn't even mention prior listed borderline dilation- will monitor on future echos   #notes likely ganglion cyst 4 x 4 cm on left wrist  Recommended follow up: Return in about 6 months (around 01/21/2024) for followup or sooner if needed.Schedule b4 you leave. Future Appointments  Date Time Provider Department Center  09/09/2023  3:40 PM Swaziland, Peter M, MD [facility]  02/11/2024 10:30 AM [facility] LAB [facility] None  02/11/2024 11:00 AM Ander Bame, MD [facility] None  06/07/2024 10:00 AM [facility] ANNUAL WELLNESS VISIT 1 [facility] PEC   Lab/Order associations:NOT fasting   ICD-10-CM   1. Preventative health care  Z00.00     2. Screening for diabetes mellitus  Z13.1     3. Overweight  E66.3     4. Chronic combined systolic and diastolic heart failure (HCC)  Z61.09     5. B12 deficiency  E53.8     6. Dyslipidemia  E78.5     7. Follicular lymphoma of intra-abdominal lymph nodes, unspecified grade (HCC) Chronic C82.93        No orders of the defined types were placed in this encounter.   Return precautions advised.  Clarisa Crooked, MD

## 2023-08-06 DIAGNOSIS — L821 Other seborrheic keratosis: Secondary | ICD-10-CM | POA: Diagnosis not present

## 2023-08-06 DIAGNOSIS — L814 Other melanin hyperpigmentation: Secondary | ICD-10-CM | POA: Diagnosis not present

## 2023-08-06 DIAGNOSIS — D225 Melanocytic nevi of trunk: Secondary | ICD-10-CM | POA: Diagnosis not present

## 2023-08-17 DIAGNOSIS — C672 Malignant neoplasm of lateral wall of bladder: Secondary | ICD-10-CM | POA: Diagnosis not present

## 2023-08-19 ENCOUNTER — Telehealth: Payer: Self-pay

## 2023-08-19 NOTE — Telephone Encounter (Signed)
 Do we even have confirmation from patient that he wants to use them?  If so may forward copy of this to pulmonology to address-please include in the note to them that we do not manage his sleep apnea-had recent visit with Dr. Linder Revere earlier this year

## 2023-08-19 NOTE — Telephone Encounter (Signed)
 In your review folder. Last ov note you stated pt follows with Eulas Hick, NP-compliant. Not sure why this form was sent to us .  Copied from CRM [id number]. Topic: Clinical - Order For Equipment >> Aug 19, 2023  9:20 AM Earnestine Goes B wrote: Reason for CRM: [facility] called to follow up on order for pt's cpap Machine and supplies. Also the updated clinic notes. States they sent over order request on 08/17/23 please fax order to [phone], Phone # [phone]

## 2023-08-20 ENCOUNTER — Telehealth: Payer: Self-pay | Admitting: Cardiology

## 2023-08-20 ENCOUNTER — Other Ambulatory Visit: Payer: Self-pay | Admitting: Urology

## 2023-08-20 DIAGNOSIS — H353124 Nonexudative age-related macular degeneration, left eye, advanced atrophic with subfoveal involvement: Secondary | ICD-10-CM | POA: Diagnosis not present

## 2023-08-20 DIAGNOSIS — Z01810 Encounter for preprocedural cardiovascular examination: Secondary | ICD-10-CM

## 2023-08-20 DIAGNOSIS — H35372 Puckering of macula, left eye: Secondary | ICD-10-CM | POA: Diagnosis not present

## 2023-08-20 DIAGNOSIS — H5213 Myopia, bilateral: Secondary | ICD-10-CM | POA: Diagnosis not present

## 2023-08-20 DIAGNOSIS — H524 Presbyopia: Secondary | ICD-10-CM | POA: Diagnosis not present

## 2023-08-20 DIAGNOSIS — H353122 Nonexudative age-related macular degeneration, left eye, intermediate dry stage: Secondary | ICD-10-CM | POA: Diagnosis not present

## 2023-08-20 NOTE — Telephone Encounter (Signed)
   Pre-operative Risk Assessment    Patient Name: Jose Ware  DOB: February 04, 1935 MRN: [id number]   Date of last office visit: 04/14/2023 Date of next office visit: 09/09/2023   Request for Surgical Clearance    Procedure:  Resection of a bladder tumor   Date of Surgery:  Clearance 09/01/23                                Surgeon:  Dr. Christina Coyer  Surgeon's Group or Practice Name:  [facility]  Phone number:  [phone] Fax number:  [phone]   Type of Clearance Requested:   - Medical  - Pharmacy:  Hold Aspirin  5 day prior    Type of Anesthesia:  General    Additional requests/questions:    SignedPrudence Brown   08/20/2023, 2:01 PM

## 2023-08-20 NOTE — Telephone Encounter (Signed)
Called and lm for pt tcb. 

## 2023-08-21 NOTE — Telephone Encounter (Signed)
   Patient Name: Jose Ware  DOB: 1935-02-07 MRN: [id number]  Primary Cardiologist: Peter Swaziland, MD  Chart reviewed as part of pre-operative protocol coverage. He was seen by Dr. Swaziland on 22/4/25. Per office note, He has class 1 angina but assessment somewhat limited by reduced mobility. Continue ASA. Continue low dose Coreg . If surgery is planned I would recommend a Myoview study to assess pre op risk.   I called patient to discuss these recommendations and discuss risks/benefits of stress test. Patient is in agreement with stress test. I have placed the orders and documented consent. Will route this message to Preop call back to arrange stress test.   Of note, initial surgery was scheduled for 6/24. Patient reports that he has to postpone due to family visiting, so he is not sure when surgery will be completed.   Debria Fang, PA-C 08/21/2023, 9:48 AM   Informed Consent   Shared Decision Making/Informed Consent The risks [chest pain, shortness of breath, cardiac arrhythmias, dizziness, blood pressure fluctuations, myocardial infarction, stroke/transient ischemic attack, nausea, vomiting, allergic reaction, radiation exposure, metallic taste sensation and life-threatening complications (estimated to be 1 in 10,000)], benefits (risk stratification, diagnosing coronary artery disease, treatment guidance) and alternatives of a nuclear stress test were discussed in detail with Mr. Dusza and he agrees to proceed.

## 2023-08-21 NOTE — Telephone Encounter (Signed)
 Will send staff message to Barry Boring to schedule Stress test for pt. Per Donnita Gales, Barry Boring will schedule stress test once she returns from vacation.

## 2023-08-24 ENCOUNTER — Other Ambulatory Visit: Payer: Self-pay | Admitting: Cardiology

## 2023-08-24 ENCOUNTER — Telehealth (HOSPITAL_COMMUNITY): Payer: Self-pay | Admitting: *Deleted

## 2023-08-24 DIAGNOSIS — Z01810 Encounter for preprocedural cardiovascular examination: Secondary | ICD-10-CM

## 2023-08-24 NOTE — Telephone Encounter (Signed)
 Patient given detailed instructions per Myocardial Perfusion Study Information Sheet for the test on 08/25/2023 at 7:45. Patient notified to arrive 15 minutes early and that it is imperative to arrive on time for appointment to keep from having the test rescheduled.  If you need to cancel or reschedule your appointment, please call the office within 24 hours of your appointment. . Patient verbalized understanding.Anne Barrios

## 2023-08-24 NOTE — Progress Notes (Addendum)
 Anesthesia Review:  PCP: Clarisa Crooked- LOV 07/21/23.  Cardiologist : Peter Swaziland   PPM/ ICD: Device Orders: Rep Notified:  Chest x-ray : EKG : Echo : 01/15/23  Stress test: 08/25/23  Cardiac Cath :  2017  Stent- 2017   Activity level:  Sleep Study/ CPAP : Fasting Blood Sugar :      / Checks Blood Sugar -- times a day:    Blood Thinner/ Instructions /Last Dose: ASA / Instructions/ Last Dose :    81 mg aspriin   07/21/23- hgba1c- 5.7

## 2023-08-24 NOTE — Patient Instructions (Signed)
 SURGICAL WAITING ROOM VISITATION  Patients having surgery or a procedure may have no more than 2 support people in the waiting area - these visitors may rotate.    Children under the age of 22 must have an adult with them who is not the patient.  Visitors with respiratory illnesses are discouraged from visiting and should remain at home.  If the patient needs to stay at the hospital during part of their recovery, the visitor guidelines for inpatient rooms apply. Pre-op nurse will coordinate an appropriate time for 1 support person to accompany patient in pre-op.  This support person may not rotate.    Please refer to the [facility] website for the visitor guidelines for Inpatients (after your surgery is over and you are in a regular room).       Your procedure is scheduled on:  09/01/23    Report to [facility] Main Entrance    Report to admitting at   0730 AM   Call this number if you have problems the morning of surgery [phone]   Do not eat food : or drink liquids After Midnight.                  If you have questions, please contact your surgeon's office.       Oral Hygiene is also important to reduce your risk of infection.                                    Remember - BRUSH YOUR TEETH THE MORNING OF SURGERY WITH YOUR REGULAR TOOTHPASTE  DENTURES WILL BE REMOVED PRIOR TO SURGERY PLEASE DO NOT APPLY Poly grip OR ADHESIVES!!!   Do NOT smoke after Midnight   Stop all vitamins and herbal supplements 7 days before surgery.   Take these medicines the morning of surgery with A SIP OF WATER :  amlodiopine, coreg    DO NOT TAKE ANY ORAL DIABETIC MEDICATIONS DAY OF YOUR SURGERY  Bring CPAP mask and tubing day of surgery.                              You may not have any metal on your body including hair pins, jewelry, and body piercing             Do not wear make-up, lotions, powders, perfumes/cologne, or deodorant  Do not wear nail polish including gel  and S&S, artificial/acrylic nails, or any other type of covering on natural nails including finger and toenails. If you have artificial nails, gel coating, etc. that needs to be removed by a nail salon please have this removed prior to surgery or surgery may need to be canceled/ delayed if the surgeon/ anesthesia feels like they are unable to be safely monitored.   Do not shave  48 hours prior to surgery.               Men may shave face and neck.   Do not bring valuables to the hospital. [facility] IS NOT             RESPONSIBLE   FOR VALUABLES.   Contacts, glasses, dentures or bridgework may not be worn into surgery.   Bring small overnight bag day of surgery.   DO NOT BRING YOUR HOME MEDICATIONS TO THE HOSPITAL. PHARMACY WILL DISPENSE MEDICATIONS LISTED ON YOUR MEDICATION LIST TO  YOU DURING YOUR ADMISSION IN THE HOSPITAL!    Patients discharged on the day of surgery will not be allowed to drive home.  Someone NEEDS to stay with you for the first 24 hours after anesthesia.   Special Instructions: Bring a copy of your healthcare power of attorney and living will documents the day of surgery if you haven't scanned them before.              Please read over the following fact sheets you were given: IF YOU HAVE QUESTIONS ABOUT YOUR PRE-OP INSTRUCTIONS PLEASE CALL [phone]   If you received a COVID test during your pre-op visit  it is requested that you wear a mask when out in public, stay away from anyone that may not be feeling well and notify your surgeon if you develop symptoms. If you test positive for Covid or have been in contact with anyone that has tested positive in the last 10 days please notify you surgeon.    [facility] - Preparing for Surgery Before surgery, you can play an important role.  Because skin is not sterile, your skin needs to be as free of germs as possible.  You can reduce the number of germs on your skin by washing with CHG (chlorahexidine gluconate) soap  before surgery.  CHG is an antiseptic cleaner which kills germs and bonds with the skin to continue killing germs even after washing. Please DO NOT use if you have an allergy to CHG or antibacterial soaps.  If your skin becomes reddened/irritated stop using the CHG and inform your nurse when you arrive at Short Stay. Do not shave (including legs and underarms) for at least 48 hours prior to the first CHG shower.  You may shave your face/neck. Please follow these instructions carefully:  1.  Shower with CHG Soap the night before surgery and the  morning of Surgery.  2.  If you choose to wash your hair, wash your hair first as usual with your  normal  shampoo.  3.  After you shampoo, rinse your hair and body thoroughly to remove the  shampoo.                           4.  Use CHG as you would any other liquid soap.  You can apply chg directly  to the skin and wash                       Gently with a scrungie or clean washcloth.  5.  Apply the CHG Soap to your body ONLY FROM THE NECK DOWN.   Do not use on face/ open                           Wound or open sores. Avoid contact with eyes, ears mouth and genitals (private parts).                       Wash face,  Genitals (private parts) with your normal soap.             6.  Wash thoroughly, paying special attention to the area where your surgery  will be performed.  7.  Thoroughly rinse your body with warm water  from the neck down.  8.  DO NOT shower/wash with your normal soap after using and rinsing off  the CHG Soap.  9.  Pat yourself dry with a clean towel.            10.  Wear clean pajamas.            11.  Place clean sheets on your bed the night of your first shower and do not  sleep with pets. Day of Surgery : Do not apply any lotions/deodorants the morning of surgery.  Please wear clean clothes to the hospital/surgery center.  FAILURE TO FOLLOW THESE INSTRUCTIONS MAY RESULT IN THE CANCELLATION OF YOUR SURGERY PATIENT  SIGNATURE_________________________________  NURSE SIGNATURE__________________________________  ________________________________________________________________________

## 2023-08-25 ENCOUNTER — Ambulatory Visit (HOSPITAL_COMMUNITY)
Admission: RE | Admit: 2023-08-25 | Discharge: 2023-08-25 | Disposition: A | Discharge status: Home / Self Care | Source: Ambulatory Visit | Attending: Cardiovascular Disease | Admitting: Cardiovascular Disease

## 2023-08-25 DIAGNOSIS — Z01810 Encounter for preprocedural cardiovascular examination: Secondary | ICD-10-CM | POA: Insufficient documentation

## 2023-08-25 LAB — MYOCARDIAL PERFUSION IMAGING
LV dias vol: 332 mL (ref 62–150)
LV sys vol: 236 mL (ref 4.2–5.8)
Nuc Stress EF: 29 %
Peak HR: 63 {beats}/min
Rest HR: 55 {beats}/min
Rest Nuclear Isotope Dose: 10.5 mCi
SDS: 2
SRS: 14
SSS: 13
ST Depression (mm): 0 mm
Stress Nuclear Isotope Dose: 32.5 mCi
TID: 0.94

## 2023-08-25 MED ORDER — TECHNETIUM TC 99M TETROFOSMIN IV KIT
32.5000 | PACK | Freq: Once | INTRAVENOUS | Status: AC | PRN
  Administered 2023-08-25: 32.5 via INTRAVENOUS

## 2023-08-25 MED ORDER — TECHNETIUM TC 99M TETROFOSMIN IV KIT
10.5000 | PACK | Freq: Once | INTRAVENOUS | Status: AC | PRN
  Administered 2023-08-25: 10.5 via INTRAVENOUS

## 2023-08-25 MED ORDER — REGADENOSON 0.4 MG/5ML IV SOLN
INTRAVENOUS | Status: AC
  Filled 2023-08-25: qty 5

## 2023-08-25 MED ORDER — REGADENOSON 0.4 MG/5ML IV SOLN
0.4000 mg | Freq: Once | INTRAVENOUS | Status: AC
  Administered 2023-08-25: 0.4 mg via INTRAVENOUS

## 2023-08-26 ENCOUNTER — Encounter (HOSPITAL_COMMUNITY)
Admission: RE | Admit: 2023-08-26 | Discharge: 2023-08-26 | Disposition: A | Discharge status: Home / Self Care | Source: Ambulatory Visit | Attending: Urology | Admitting: Urology

## 2023-08-26 ENCOUNTER — Encounter (HOSPITAL_COMMUNITY): Payer: Self-pay

## 2023-08-26 ENCOUNTER — Other Ambulatory Visit: Payer: Self-pay

## 2023-08-26 VITALS — BP 116/70 | HR 58 | Temp 98.4°F | Resp 16 | Ht 73.0 in | Wt 192.0 lb

## 2023-08-26 DIAGNOSIS — I110 Hypertensive heart disease with heart failure: Secondary | ICD-10-CM | POA: Insufficient documentation

## 2023-08-26 DIAGNOSIS — C672 Malignant neoplasm of lateral wall of bladder: Secondary | ICD-10-CM | POA: Diagnosis not present

## 2023-08-26 DIAGNOSIS — I255 Ischemic cardiomyopathy: Secondary | ICD-10-CM | POA: Insufficient documentation

## 2023-08-26 DIAGNOSIS — Z01812 Encounter for preprocedural laboratory examination: Secondary | ICD-10-CM | POA: Diagnosis not present

## 2023-08-26 DIAGNOSIS — Z8673 Personal history of transient ischemic attack (TIA), and cerebral infarction without residual deficits: Secondary | ICD-10-CM | POA: Diagnosis not present

## 2023-08-26 DIAGNOSIS — I252 Old myocardial infarction: Secondary | ICD-10-CM | POA: Insufficient documentation

## 2023-08-26 DIAGNOSIS — Z87891 Personal history of nicotine dependence: Secondary | ICD-10-CM | POA: Insufficient documentation

## 2023-08-26 DIAGNOSIS — I509 Heart failure, unspecified: Secondary | ICD-10-CM | POA: Insufficient documentation

## 2023-08-26 DIAGNOSIS — I2510 Atherosclerotic heart disease of native coronary artery without angina pectoris: Secondary | ICD-10-CM | POA: Diagnosis not present

## 2023-08-26 DIAGNOSIS — G4733 Obstructive sleep apnea (adult) (pediatric): Secondary | ICD-10-CM | POA: Insufficient documentation

## 2023-08-26 DIAGNOSIS — Z01818 Encounter for other preprocedural examination: Secondary | ICD-10-CM

## 2023-08-26 HISTORY — DX: Anxiety disorder, unspecified: F41.9

## 2023-08-26 LAB — CBC
HCT: 37 % — ABNORMAL LOW (ref 39.0–52.0)
Hemoglobin: 11.9 g/dL — ABNORMAL LOW (ref 13.0–17.0)
MCH: 31.6 pg (ref 26.0–34.0)
MCHC: 32.2 g/dL (ref 30.0–36.0)
MCV: 98.1 fL (ref 80.0–100.0)
Platelets: 218 10*3/uL (ref 150–400)
RBC: 3.77 MIL/uL — ABNORMAL LOW (ref 4.22–5.81)
RDW: 15.2 % (ref 11.5–15.5)
WBC: 8.4 10*3/uL (ref 4.0–10.5)
nRBC: 0 % (ref 0.0–0.2)

## 2023-08-26 LAB — BASIC METABOLIC PANEL WITH GFR
Anion gap: 10 (ref 5–15)
BUN: 32 mg/dL — ABNORMAL HIGH (ref 8–23)
CO2: 24 mmol/L (ref 22–32)
Calcium: 8.9 mg/dL (ref 8.9–10.3)
Chloride: 104 mmol/L (ref 98–111)
Creatinine, Ser: 1.11 mg/dL (ref 0.61–1.24)
GFR, Estimated: >60 mL/min (ref >60)
Glucose, Bld: 108 mg/dL — ABNORMAL HIGH (ref 70–99)
Potassium: 3.8 mmol/L (ref 3.5–5.1)
Sodium: 138 mmol/L (ref 135–145)

## 2023-08-26 NOTE — Telephone Encounter (Signed)
   Name: JAHRON HUNSINGER  DOB: 1934-11-10  MRN: [id number]   Primary Cardiologist: Peter Swaziland, MD  Chart reviewed as part of pre-operative protocol coverage. Patient was contacted 08/26/2023 in reference to pre-operative risk assessment for pending surgery as outlined below.    He underwent nuclear stress test yesterday.  Per Dr. Swaziland: I think he would be ok. No ischemia noted. Inferolateral defect c/w old infarct. EF is low by PET CT but in November EF was stable at 40-45%. Suspect underestimated on PET. If he is not having active CHF or anginal symptoms I would clear   I was able to speak with the patient this morning and confirmed no new cardiac symptoms. Given possibly reduced LVEF, would avoid aggressive hydration during surgery. OK to hold ASA 5-7 days as needed.   Therefore, based on ACC/AHA guidelines, the patient would be at acceptable risk for the planned procedure without further cardiovascular testing.   The patient was advised that if he develops new symptoms prior to surgery to contact our office to arrange for a follow-up visit, and he verbalized understanding.  I will route this recommendation to the requesting party via Epic fax function and remove from pre-op pool. Please call with questions.  Warren Haber Tameya Kuznia, PA 08/26/2023, 10:52 AM

## 2023-08-27 NOTE — Anesthesia Preprocedure Evaluation (Signed)
 Anesthesia Evaluation    Airway        Dental   Pulmonary former smoker          Cardiovascular hypertension,      Neuro/Psych    GI/Hepatic   Endo/Other    Renal/GU      Musculoskeletal   Abdominal   Peds  Hematology   Anesthesia Other Findings   Reproductive/Obstetrics                              Anesthesia Physical Anesthesia Plan  ASA:   Anesthesia Plan:    Post-op Pain Management:    Induction:   PONV Risk Score and Plan:   Airway Management Planned:   Additional Equipment:   Intra-op Plan:   Post-operative Plan:   Informed Consent:   Plan Discussed with:   Anesthesia Plan Comments: (See PAT note from 6/18)         Anesthesia Quick Evaluation

## 2023-08-27 NOTE — Progress Notes (Signed)
 Case: [id number] Date/Time: 09/01/23 0845   Procedure: TURBT, WITH CHEMOTHERAPEUTIC AGENT INSTILLATION INTO BLADDER - GEMCITABINE  2000 MG INTRAVESICAL POST OP LONG MONOPOLAR HANDLE AND LOOP   Anesthesia type: General   Diagnosis: Cancer of lateral wall of urinary bladder (HCC) [C67.2]   Pre-op diagnosis: BLADDER CANCER   [facility] ROOM 08 / [facility]   Surgeons: Christina Coyer, MD       DISCUSSION: Jose Ware is an 89 yo male who presents to PAT prior to surgery above for bladder cancer. PMH of former smoking, HTN, hx of STEMI (2017), CAD s/p staged PCI of LAD and RCA (2017), CHF, mild AS, OSA (uses CPAP), hx of CVA (2017 after cardiac cath), prostate cancer s/p XRT (2010), Non Hodgkin's lymphoma, anemia, anxiety.  Prior anesthesia complications include difficult intubation. Prior airway note from 03/06/2023: Elected for video intubation due to prior airway note (from 05/12/2014); no difficulty intubating with glidescope  Pt follows with Cardiology for above hx. Had STEMI in 2017 and underwent staged PCI. Has ischemic cardiomyopathy with EF 40-45% by last echo in 01/2023. Also mild-mod MR and mild AS. Patient last seen in clinic on 04/14/23 by Dr. Swaziland. Stress test was recommended pre op which was done on 6/17. It showed area of infarction without ischemia which was thought to be due to his old MI. Also showed reduced EF to 29% which was thought to be artificially low. Per Dr. Swaziland in 6/18 telephone encounter:  I think he would be ok. No ischemia noted. Inferolateral defect c/w old infarct. EF is low by PET CT but in November EF was stable at 40-45%. Suspect underestimated on PET. If he is not having active CHF or anginal symptoms I would clear  I was able to speak with the patient this morning and confirmed no new cardiac symptoms. Given possibly reduced LVEF, would avoid aggressive hydration during surgery. OK to hold ASA 5-7 days as needed.   Therefore, based on ACC/AHA  guidelines, the patient would be at acceptable risk for the planned procedure without further cardiovascular testing.   Patient follows with Pulmonology for OSA. Last seen in 06/01/2023. He uses a CPAP.  He follows with Oncology for follicular lymphoma diagnosed in 2022. Last seen on 02/10/23. This is currently being observed since he is stage I-II.   VS: BP 116/70   Pulse (!) 58   Temp 36.9 C (Oral)   Resp 16   Ht 6' 1 (1.854 m)   Wt 87.1 kg   SpO2 98%   BMI 25.33 kg/m   PROVIDERS: Almira Jaeger, MD Oncology: Amparo Balk, MD Cardiology: Peter Swaziland, MD Pulmonology: Wilder Handy, MD  LABS: Labs reviewed: Acceptable for surgery. (all labs ordered are listed, but only abnormal results are displayed)  Labs Reviewed  CBC - Abnormal; Notable for the following components:      Result Value   RBC 3.77 (*)    Hemoglobin 11.9 (*)    HCT 37.0 (*)    All other components within normal limits  BASIC METABOLIC PANEL WITH GFR - Abnormal; Notable for the following components:   Glucose, Bld 108 (*)    BUN 32 (*)    All other components within normal limits     EKG 08/25/23:  Sinus bradycardia with frequent Premature ventricular complexes in a pattern of bigeminy, rate 57 Possible Inferior infarct , age undetermined ST & T wave abnormality, consider lateral ischemia Abnormal ECG When compared with ECG of 14-Jul-2022 10:01, Significant changes have  occurred more frequent PVCls and slower intrinsic rate  CV:  Stress test 08/25/23:    Findings are consistent with infarction. The study is high risk based upon prior inferolateral infarction and reduced EF 29%.   No ST deviation was noted.   LV perfusion is abnormal. There is no evidence of ischemia. There is evidence of infarction. Defect 1: There is a large defect with severe reduction in uptake present in the apical to basal inferolateral location(s) that is fixed. There is abnormal wall motion in the defect area. Consistent  with infarction.   Left ventricular function is abnormal. Nuclear stress EF: 29%. The left ventricular ejection fraction is severely decreased (<30%). End diastolic cavity size is severely enlarged. End systolic cavity size is severely enlarged.   CT images were obtained for attenuation correction and were examined for the presence of coronary calcium  when appropriate.   Coronary calcium  assessment not performed due to prior revascularization.   Echo 01/15/23:  IMPRESSIONS    1. Left ventricular ejection fraction, by estimation, is 40 to 45%. Left ventricular ejection fraction by 3D volume is 45 %. The left ventricle has mildly decreased function. The left ventricle demonstrates regional wall motion abnormalities (see scoring diagram/findings for description). The left ventricular internal cavity size was moderately dilated. There is mild left ventricular hypertrophy. Left ventricular diastolic parameters are consistent with Grade I diastolic dysfunction (impaired relaxation). Elevated left ventricular end-diastolic pressure. The E/e' is 16. There is severe akinesis of the left ventricular, mid-apical inferior wall and inferoseptal wall.  2. Right ventricular systolic function is normal. The right ventricular size is normal. There is mildly elevated pulmonary artery systolic pressure.  3. Left atrial size was severely dilated.  4. The mitral valve is abnormal. Mild to moderate mitral valve regurgitation.  5. The aortic valve is tricuspid. Aortic valve regurgitation is trivial. Mild aortic valve stenosis. Aortic valve area, by VTI measures 1.57 cm. Aortic valve mean gradient measures 10.0 mmHg. Aortic valve Vmax measures 2.20 m/s.  6. Aortic dilatation noted. There is mild dilatation of the ascending aorta, measuring 41 mm.  7. The inferior vena cava is normal in size with greater than 50% respiratory variability, suggesting right atrial pressure of 3 mmHg.  Comparison(s): No  significant change from prior study. 06/01/2022: LVEF 40-45%, inferior distal septal and apical hypokinesis. Past Medical History:  Diagnosis Date   Adenocarcinoma of prostate [facility])    XRT & Radiation seed implantation in 2010   Adenomatous colon polyp    Anemia    Anxiety    CAD (coronary artery disease)    a. 11/04/15:  Acute inferolateral STEMI: S/p emergent DES of a very large LCx with extensive thrombus. Significant residual disease in the proximal LAD and distal RCA. S/p staged PCI of LAD and RCA 8/29.   Cataract    CHF (congestive heart failure) (HCC)    CVA (cerebral infarction)    a. 10/2015: embolic CVA after heart cath    DIVERTICULITIS, HX OF 11/27/2006        DJD (degenerative joint disease)    wrist - R    Hernia    History of blood transfusion 1985   with colon surgery   History of kidney stones    Hypertension    Ischemic cardiomyopathy    a. EF 25-30% by LV gram on cath 11/04/15. Appeared out of proportion to infarct although infarct was large. EF improved by Echo and now 40-45%.   Myocardial infarction (HCC) 2019   NEPHROLITHIASIS,  HX OF 11/27/2006         Non Hodgkin's lymphoma (HCC) 11/2020   watching at this point, in abdomen, no meds or chemo yet as of 01-27-2022   Peripheral neuropathy    pt denies   Pneumonia    as a child   Psoriasis    Sleep apnea    wears cpap   Stroke [facility])    no residuals   Tenosynovitis     Past Surgical History:  Procedure Laterality Date   APPENDECTOMY  03/11/1983   BACK SURGERY  03/10/1978   minor disk surgery: instrumentation placed and removed in a second procedure   CARDIAC CATHETERIZATION N/A 11/04/2015   Procedure: Left Heart Cath and Coronary Angiography;  Surgeon: Peter M Swaziland, MD;  Location: [facility] INVASIVE CV LAB;  Service: Cardiovascular;  Laterality: N/A;   CARDIAC CATHETERIZATION N/A 11/04/2015   Procedure: Coronary Stent Intervention;  Surgeon: Peter M Swaziland, MD;  Location: [facility] INVASIVE CV LAB;  Service:  Cardiovascular;  Laterality: N/A;   CARDIAC CATHETERIZATION N/A 11/06/2015   Procedure: Coronary Stent Intervention;  Surgeon: Arleen Lacer, MD;  Location: [facility] INVASIVE CV LAB;  Service: Cardiovascular;  Laterality: N/A;   CATARACT EXTRACTION, BILATERAL  03/11/2011   CHOLECYSTECTOMY  03/10/1984   COLON SURGERY  11/09/1983   pt. had ileus, had colon surgery, requiring colostomy & then reversal & then dehisence of that wound & return to OR for repair & cholecystectomy     COLONOSCOPY  2019   COLONOSCOPY WITH PROPOFOL  N/A 03/13/2022   Procedure: COLONOSCOPY WITH PROPOFOL ;  Surgeon: Asencion Blacksmith, MD;  Location: [facility];  Service: Gastroenterology;  Laterality: N/A;   COLOSTOMY  03/11/1983   After colectomy for diverticulitis, pt. remarks during this surgery they gave me the paddles two times  because of bleeding, pt. states it was unrelated to any anesthesia complication   CYSTOSCOPY WITH BIOPSY N/A 03/06/2023   Procedure: CYSTOSCOPY WITH BIOPSY;  Surgeon: Christina Coyer, MD;  [facility];  Service: Urology;  Laterality: N/A;  23 MINUTES FOR CASE   EYE SURGERY     /w IOL   KNEE ARTHROSCOPY Left 03/10/2001   KNEE SURGERY Left 03/10/1954   Left 1956, Right w/cartilage removed later   PARATHYROIDECTOMY N/A 05/12/2014   Procedure: PARATHYROIDECTOMY;  Surgeon: Oralee Billow, MD;  Location: [facility] OR;  Service: General;  Laterality: N/A;   POLYPECTOMY     POLYPECTOMY  03/13/2022   Procedure: POLYPECTOMY;  Surgeon: Asencion Blacksmith, MD;  Location: [facility];  Service: Gastroenterology;;   REVERSAL OF COLOSTOMY  03/10/1985   TONSILLECTOMY  03/10/1944   TRANSURETHRAL RESECTION OF BLADDER TUMOR N/A 03/06/2023   Procedure: TRANSURETHRAL RESECTION OF BLADDER TUMOR (TURBT);  Surgeon: Christina Coyer, MD;  [facility];  Service: Urology;  Laterality: N/A;  2-5 cm   WRIST SURGERY Left    Remote complicated w/infection    MEDICATIONS:  acetaminophen  (TYLENOL ) 500 MG tablet    amLODipine  (NORVASC ) 5 MG tablet   aspirin  EC 81 MG tablet   atorvastatin  (LIPITOR ) 10 MG tablet   carvedilol  (COREG ) 3.125 MG tablet   Cyanocobalamin  (VITAMIN B-12) 5000 MCG TBDP   docusate sodium  (COLACE) 100 MG capsule   furosemide  (LASIX ) 20 MG tablet   Multiple Vitamins-Minerals (PRESERVISION AREDS 2) CAPS   nitroGLYCERIN  (NITROSTAT ) 0.4 MG SL tablet   spironolactone  (ALDACTONE ) 25 MG tablet   No current facility-administered medications for this encounter.   Kimble Pennant, PA-C [facility] Short Stay/Anesthesiology [facility] Phone [phone]  [phone] 08/27/2023 1:07 PM

## 2023-08-31 NOTE — H&P (Signed)
 Office Visit Report     08/17/2023   --------------------------------------------------------------------------------   Jose Ware Fraction  MRN: [id number]  DOB: May 21, 1934, 89 year old Male  SSN: [id number]   PRIMARY CARE:  [name]. [name], MD  PRIMARY CARE FAX:  [phone]  REFERRING:  [name]. [name], M  PROVIDER:  [name], M.D.  LOCATION:  [facility] [phone]     --------------------------------------------------------------------------------   CC/HPI: F/u -   1) bladder cancer-diagnosed with a high grade T1 poorly differentiated (5% giant cell, 10% micropapillary, positive LVI) on TURBT December 2024. Muscle was present - negative. His bladder (prior ex lap, colectomy and then reversal) and bladder neck (prior prostate brachytherapy) were scarred and fixed making TUR very difficult to reach the tumor. Initially, I could not even see the lesion with a 30 degree lens.   BCG:  May 2025 BCG x 6   Staging:  July 2024 CT A/P-focal asymmetric thickening of the right bladder wall. No hydronephrosis. Increased size of a few bulky retroperitoneal and retrocrural lymph nodes. Similar lytic lesions in the lumbar vertebral bodies stable since 2016 (NHL).  February 2025-CT A/P-Bulky retroperitoneal and left periaortic nodes are stable, L2, L3 lytic lesions stable. No pelvic adenopathy. Right bladder wall thickening.   2) h/o PCa - Brachy in 2009. Jan 2025 PSA undetectable.   3) MH - CT in 2016 benign. Jan 2018, Ultrasound showed no interval growth of bilateral cystic structures. Cystoscopy was benign except for a mild stricture at the membranous urethra which the scope easily passed through. Cysto and US  in 2019 benign.   Today, patient seen for the above. Saw Dr. Alvaro and elected not to proceed with cystectomy. Underwent BCG x 6. Cystoscopy today-June 2025-some residual right nodular tissue on the right bladder wall.   Saw Dr. Galen for NHL.  His PSA was 0.0 on KPN Nov 2023. He had a sleep study and wears a CPAP. He played for [employer] and then the  [employer]. Patient taking Plavix  after a myocardial infarction.       ALLERGIES: Betadine - Blistering Betadine Skin Cleanser SOLN Doxycycline  - Cardiac Arrest, Rash on legs Venom-Honey Bee - face swelling    MEDICATIONS: Acetaminophen   amLODIPine  Besylate 5 MG Tablet  Aspirin  81 MG TABS Oral  Carvedilol  3.125 MG Tablet tablet  C-PAP  Ducolax  Losartan  Potassium 100 MG Tablet  Nitrostat  0.4 MG Tablet Sublingual tablet  Preservision Areds  Spironolactone  25 MG Tablet  Vitamin B 12     GU PSH: Bladder Instill AntiCA Agent - 07/09/2023, 07/02/2023, 06/25/2023, 06/18/2023, 06/11/2023, 06/04/2023 Cystoscopy - 02/02/2023, 2019, 2018 Locm 300-399Mg /Ml Iodine,1Ml - 04/21/2023 TRANSPERI NEEDLE PLACE, PROS - 2009       PSH Notes: Surgery Prostate Transperineal Placement Of Needles, Colostomy, Appendectomy, Colon Surgery, Cholecystectomy colostomy reversed, removal left knee meniscus , left wrist surgery   NON-GU PSH: Appendectomy - 2009 Cholecystectomy (open) - 2009 Colostomy - 2009 Heart Stents - about 2017 Visit Complexity (formerly GPC1X) - 05/11/2023, 03/19/2023     GU PMH: Bladder Cancer Lateral - 07/09/2023, - 07/02/2023, - 06/25/2023, - 06/18/2023, - 06/11/2023, - 06/04/2023, - 05/11/2023, - 04/21/2023 Gross hematuria - 05/11/2023, (Stable), - 2018 History of prostate cancer - 05/11/2023, - 09/25/2022 Renal cyst - 05/11/2023      PMH Notes: NON HODgkins lymphoma of the stomach 2022 No treatment yet --Dr. Shellia  Kidney stone       NON-GU PMH: No Non-GU PMH    FAMILY  HISTORY: Anemia - Brother Cancer - Sister Cirrhosis - Father Death In The Family Father - Runs In Family Death In The Family Mother - Runs In Family Family Health Status Children is 1 daughter and 1 - Runs In Family Heart Disease - Mother, Sister   SOCIAL HISTORY: Marital Status: Married Preferred  Language: English; Race: White Current Smoking Status: Patient does not smoke anymore. Has not smoked since 02/07/1977. Smoked for 20 years. Smoked 1 pack per day.   Tobacco Use Assessment Completed: Used Tobacco in last 30 days? Social Drinker.  Drinks 2 caffeinated drinks per day. Patient's occupation is/was retired.     Notes: Former smoker, Caffeine Use, Tobacco Use, Marital History - Currently Married, Retired From Work, Alcohol Use   REVIEW OF SYSTEMS:    GU Review Male:   Patient reports frequent urination and get up at night to urinate. Patient denies hard to postpone urination, burning/ pain with urination, leakage of urine, stream starts and stops, trouble starting your stream, have to strain to urinate , erection problems, and penile pain.  Gastrointestinal (Upper):   Patient denies nausea, vomiting, and indigestion/ heartburn.  Gastrointestinal (Lower):   Patient denies diarrhea and constipation.  Constitutional:   Patient denies fever, night sweats, weight loss, and fatigue.  Skin:   Patient denies skin rash/ lesion and itching.  Eyes:   Patient denies blurred vision and double vision.  Ears/ Nose/ Throat:   Patient denies sore throat and sinus problems.  Hematologic/Lymphatic:   Patient denies swollen glands and easy bruising.  Cardiovascular:   Patient denies leg swelling and chest pains.  Respiratory:   Patient denies cough and shortness of breath.  Endocrine:   Patient denies excessive thirst.  Musculoskeletal:   Patient denies back pain and joint pain.  Neurological:   Patient denies headaches and dizziness.  Psychologic:   Patient denies depression and anxiety.   VITAL SIGNS: None   GU PHYSICAL EXAMINATION:    Scrotum: No lesions. No edema. No cysts. No warts.  Urethral Meatus: Normal size. No lesion, no wart, no discharge, no polyp. Normal location.  Penis: Circumcised, no warts, no cracks. No dorsal Peyronie's plaques, no left corporal Peyronie's plaques, no right  corporal Peyronie's plaques, no scarring, no warts. No balanitis, no meatal stenosis.   MULTI-SYSTEM PHYSICAL EXAMINATION:    Constitutional: Well-nourished. No physical deformities. Normally developed. Good grooming.  Neck: Neck symmetrical, not swollen. Normal tracheal position.  Respiratory: No labored breathing, no use of accessory muscles.   Cardiovascular: Normal temperature, normal extremity pulses, no swelling, no varicosities.  Skin: No paleness, no jaundice, no cyanosis. No lesion, no ulcer, no rash.  Neurologic / Psychiatric: Oriented to time, oriented to place, oriented to person. No depression, no anxiety, no agitation.  Gastrointestinal: No mass, no tenderness, no rigidity, non obese abdomen.     Complexity of Data:   04/06/23 07/20/18 07/14/17 01/22/16 12/14/14 12/12/13 12/08/12 09/30/11  PSA  Total PSA <0.015 ng/mL <0.015 ng/mL <0.015 ng/mL < 0.015 ng/dl 9.98  9.97  9.97  9.93     PROCEDURES:         Flexible Cystoscopy - 52000  Risks, benefits, and some of the potential complications of the procedure were discussed with the patient. All questions were answered. Informed consent was obtained. Antibiotic prophylaxis was given -- Cephalexin . Sterile technique and intraurethral analgesia were used.  Meatus:  Normal size. Normal location. Normal condition.  Urethra:  No strictures.  External Sphincter:  Normal.  Verumontanum:  Normal.  Prostate:  Non-obstructing. No hyperplasia.  Bladder Neck:  Non-obstructing.  Ureteral Orifices:  Normal location. Normal size. Normal shape. Effluxed clear urine.  Bladder:  Mild trabeculation. A right lateral wall nodular tumor. Normal mucosa. No stones.      The lower urinary tract was carefully examined. The procedure was well-tolerated and without complications. Antibiotic instructions were given. Instructions were given to call the office immediately for bloody urine, difficulty urinating, painful urination, fever, chills, nausea,  vomiting or other illness. The patient stated that he understood these instructions and would comply with them.         Urinalysis Dipstick Dipstick Cont'd  Color: Yellow Bilirubin: Neg mg/dL  Appearance: Clear Ketones: Neg mg/dL  Specific Gravity: 8.989 Blood: Neg ery/uL  pH: <=5.0 Protein: Neg mg/dL  Glucose: Neg mg/dL Urobilinogen: 0.2 mg/dL    Nitrites: Neg    Leukocyte Esterase: Neg leu/uL    ASSESSMENT:      ICD-10 Details  1 GU:   Bladder Cancer Lateral - C67.2 Chronic, Stable - We reviewed the right bladder wall tumor on the monitor and we went over the nature risk benefits and alternatives to TURBT and he will proceed. We might consider chemoradiation if he progresses. They again confirmed their desire not to proceed with cystectomy.   PLAN:           Schedule Return Visit/Planned Activity: Next Available Appointment - Schedule Surgery          Document Letter(s):  Created for Patient: Clinical Summary    * Signed by [name], M.D. on 08/17/23 at 3:52 PM (EDT)*

## 2023-09-01 ENCOUNTER — Ambulatory Visit (HOSPITAL_BASED_OUTPATIENT_CLINIC_OR_DEPARTMENT_OTHER): Admitting: Anesthesiology

## 2023-09-01 ENCOUNTER — Encounter (HOSPITAL_COMMUNITY): Payer: Self-pay | Admitting: Urology

## 2023-09-01 ENCOUNTER — Encounter (HOSPITAL_COMMUNITY)
Admission: RE | Disposition: A | Discharge status: Home / Self Care | Payer: Self-pay | Source: Home / Self Care | Attending: Urology

## 2023-09-01 ENCOUNTER — Other Ambulatory Visit: Payer: Self-pay

## 2023-09-01 ENCOUNTER — Ambulatory Visit (HOSPITAL_COMMUNITY)
Admission: RE | Admit: 2023-09-01 | Discharge: 2023-09-01 | Disposition: A | Discharge status: Home / Self Care | Attending: Urology | Admitting: Urology

## 2023-09-01 ENCOUNTER — Ambulatory Visit (HOSPITAL_COMMUNITY): Payer: Self-pay | Admitting: Medical

## 2023-09-01 DIAGNOSIS — C679 Malignant neoplasm of bladder, unspecified: Secondary | ICD-10-CM | POA: Insufficient documentation

## 2023-09-01 DIAGNOSIS — I509 Heart failure, unspecified: Secondary | ICD-10-CM | POA: Insufficient documentation

## 2023-09-01 DIAGNOSIS — I110 Hypertensive heart disease with heart failure: Secondary | ICD-10-CM | POA: Insufficient documentation

## 2023-09-01 DIAGNOSIS — I252 Old myocardial infarction: Secondary | ICD-10-CM | POA: Insufficient documentation

## 2023-09-01 DIAGNOSIS — C672 Malignant neoplasm of lateral wall of bladder: Secondary | ICD-10-CM | POA: Diagnosis present

## 2023-09-01 DIAGNOSIS — G4730 Sleep apnea, unspecified: Secondary | ICD-10-CM | POA: Diagnosis not present

## 2023-09-01 DIAGNOSIS — Z7902 Long term (current) use of antithrombotics/antiplatelets: Secondary | ICD-10-CM | POA: Diagnosis not present

## 2023-09-01 DIAGNOSIS — Z8572 Personal history of non-Hodgkin lymphomas: Secondary | ICD-10-CM | POA: Diagnosis not present

## 2023-09-01 DIAGNOSIS — C675 Malignant neoplasm of bladder neck: Secondary | ICD-10-CM | POA: Diagnosis not present

## 2023-09-01 DIAGNOSIS — I2510 Atherosclerotic heart disease of native coronary artery without angina pectoris: Secondary | ICD-10-CM

## 2023-09-01 DIAGNOSIS — D414 Neoplasm of uncertain behavior of bladder: Secondary | ICD-10-CM

## 2023-09-01 DIAGNOSIS — Z87891 Personal history of nicotine dependence: Secondary | ICD-10-CM | POA: Insufficient documentation

## 2023-09-01 DIAGNOSIS — Z01818 Encounter for other preprocedural examination: Secondary | ICD-10-CM

## 2023-09-01 SURGERY — TURBT, WITH CHEMOTHERAPEUTIC AGENT INSTILLATION INTO BLADDER
Anesthesia: General | Site: Bladder

## 2023-09-01 MED ORDER — ROCURONIUM BROMIDE 10 MG/ML (PF) SYRINGE
PREFILLED_SYRINGE | INTRAVENOUS | Status: DC | PRN
  Administered 2023-09-01: 70 mg via INTRAVENOUS

## 2023-09-01 MED ORDER — ROCURONIUM BROMIDE 10 MG/ML (PF) SYRINGE
PREFILLED_SYRINGE | INTRAVENOUS | Status: AC
Start: 2023-09-01 — End: 2023-09-01
  Filled 2023-09-01: qty 10

## 2023-09-01 MED ORDER — ACETAMINOPHEN 500 MG PO TABS
1000.0000 mg | ORAL_TABLET | Freq: Once | ORAL | Status: AC
  Administered 2023-09-01: 1000 mg via ORAL
  Filled 2023-09-01: qty 2

## 2023-09-01 MED ORDER — OXYCODONE HCL 5 MG/5ML PO SOLN: 5.0000 mg | Freq: Once | ORAL | Status: DC | PRN

## 2023-09-01 MED ORDER — CEFAZOLIN SODIUM-DEXTROSE 2-4 GM/100ML-% IV SOLN
2.0000 g | INTRAVENOUS | Status: AC
  Administered 2023-09-01: 2 g via INTRAVENOUS
  Filled 2023-09-01: qty 100

## 2023-09-01 MED ORDER — ORAL CARE MOUTH RINSE: 15.0000 mL | Freq: Once | OROMUCOSAL | Status: AC

## 2023-09-01 MED ORDER — FENTANYL CITRATE (PF) 100 MCG/2ML IJ SOLN
INTRAMUSCULAR | Status: AC
  Filled 2023-09-01: qty 2

## 2023-09-01 MED ORDER — FENTANYL CITRATE PF 50 MCG/ML IJ SOSY: 25.0000 ug | PREFILLED_SYRINGE | INTRAMUSCULAR | Status: DC | PRN

## 2023-09-01 MED ORDER — LACTATED RINGERS IV SOLN: INTRAVENOUS | Status: DC

## 2023-09-01 MED ORDER — MEPERIDINE HCL 50 MG/ML IJ SOLN: 6.2500 mg | INTRAMUSCULAR | Status: DC | PRN

## 2023-09-01 MED ORDER — LIDOCAINE 2% (20 MG/ML) 5 ML SYRINGE
INTRAMUSCULAR | Status: DC | PRN
  Administered 2023-09-01: 80 mg via INTRAVENOUS

## 2023-09-01 MED ORDER — ROCURONIUM BROMIDE 10 MG/ML (PF) SYRINGE
PREFILLED_SYRINGE | INTRAVENOUS | Status: AC
  Filled 2023-09-01: qty 10

## 2023-09-01 MED ORDER — SUGAMMADEX SODIUM 200 MG/2ML IV SOLN
INTRAVENOUS | Status: DC | PRN
  Administered 2023-09-01: 200 mg via INTRAVENOUS

## 2023-09-01 MED ORDER — PROPOFOL 10 MG/ML IV BOLUS
INTRAVENOUS | Status: DC | PRN
  Administered 2023-09-01: 100 mg via INTRAVENOUS

## 2023-09-01 MED ORDER — GEMCITABINE CHEMO FOR BLADDER INSTILLATION 2000 MG
2000.0000 mg | Freq: Once | INTRAVENOUS | Status: AC
  Administered 2023-09-01: 2000 mg via INTRAVESICAL
  Filled 2023-09-01: qty 2000

## 2023-09-01 MED ORDER — SODIUM CHLORIDE 0.9 % IR SOLN
Status: DC | PRN
Start: 2023-09-01 — End: 2023-09-01
  Administered 2023-09-01: 3000 mL via INTRAVESICAL

## 2023-09-01 MED ORDER — ONDANSETRON HCL 4 MG/2ML IJ SOLN: 4.0000 mg | Freq: Once | INTRAMUSCULAR | Status: DC | PRN

## 2023-09-01 MED ORDER — FENTANYL CITRATE (PF) 100 MCG/2ML IJ SOLN
INTRAMUSCULAR | Status: DC | PRN
  Administered 2023-09-01 (×2): 50 ug via INTRAVENOUS

## 2023-09-01 MED ORDER — OXYCODONE HCL 5 MG PO TABS: 5.0000 mg | ORAL_TABLET | Freq: Once | ORAL | Status: DC | PRN

## 2023-09-01 MED ORDER — STERILE WATER FOR IRRIGATION IR SOLN
Status: DC | PRN
Start: 2023-09-01 — End: 2023-09-01
  Administered 2023-09-01: 3000 mL

## 2023-09-01 MED ORDER — EPHEDRINE SULFATE-NACL 50-0.9 MG/10ML-% IV SOSY
PREFILLED_SYRINGE | INTRAVENOUS | Status: DC | PRN
  Administered 2023-09-01: 5 mg via INTRAVENOUS
  Administered 2023-09-01: 15 mg via INTRAVENOUS

## 2023-09-01 MED ORDER — PHENYLEPHRINE 80 MCG/ML (10ML) SYRINGE FOR IV PUSH (FOR BLOOD PRESSURE SUPPORT)
PREFILLED_SYRINGE | INTRAVENOUS | Status: DC | PRN
  Administered 2023-09-01 (×2): 80 ug via INTRAVENOUS

## 2023-09-01 MED ORDER — PROPOFOL 10 MG/ML IV BOLUS
INTRAVENOUS | Status: AC
  Filled 2023-09-01: qty 20

## 2023-09-01 MED ORDER — EPHEDRINE 5 MG/ML INJ
INTRAVENOUS | Status: AC
  Filled 2023-09-01: qty 5

## 2023-09-01 MED ORDER — CHLORHEXIDINE GLUCONATE 0.12 % MT SOLN
15.0000 mL | Freq: Once | OROMUCOSAL | Status: AC
  Administered 2023-09-01: 15 mL via OROMUCOSAL

## 2023-09-01 MED ORDER — ONDANSETRON HCL 4 MG/2ML IJ SOLN
INTRAMUSCULAR | Status: DC | PRN
  Administered 2023-09-01: 4 mg via INTRAVENOUS

## 2023-09-01 SURGICAL SUPPLY — 11 items
BAG URINE DRAIN 2000ML AR STRL (UROLOGICAL SUPPLIES) IMPLANT
BAG URO CATCHER STRL LF (MISCELLANEOUS) ×1 IMPLANT
DRAPE FOOT SWITCH (DRAPES) ×1 IMPLANT
GLOVE SURG LX STRL 7.5 STRW (GLOVE) ×1 IMPLANT
GOWN STRL REUS W/ TWL XL LVL3 (GOWN DISPOSABLE) ×1 IMPLANT
KIT TURNOVER KIT A (KITS) ×1 IMPLANT
LOOP CUT BIPOLAR 24F LRG (ELECTROSURGICAL) IMPLANT
MANIFOLD NEPTUNE II (INSTRUMENTS) ×1 IMPLANT
PACK CYSTO (CUSTOM PROCEDURE TRAY) ×1 IMPLANT
TUBING CONNECTING 10 (TUBING) ×1 IMPLANT
TUBING UROLOGY SET (TUBING) ×1 IMPLANT

## 2023-09-01 NOTE — Op Note (Addendum)
 Preoperative diagnosis: Bladder neoplasm of uncertain malignant potential Postoperative diagnosis: Same  Procedure: TURBT 0.5 to 2 cm  Surgeon: Nieves  Anesthesia: General  Indication for procedure: Jose Ware is an 89 year old male with a history of high-grade bladder cancer.  He was unfit for cystectomy and underwent BCG x 6.  On recent cystoscopy it looks like he had some residual or recurrent disease at the right bladder neck.  Findings: As encountered previously due to his prior colectomy and radiation the bladder neck was fixed.  Very difficult to angle over to the tumor.  Initially not seen but with torque on the scope was able to be visualized at the right BN more proximal to the [facility] than his first resection.  The bipolar resectoscope bent and we switched over to the long resectoscope mainly because it was stiffer and there was more purchase extending from the penis to put leverage on the scope to get to the tumor.  No other mucosal lesions were noted.  Ureteral orifices were in the normal orthotopic position and not involved.  Clear efflux.  Description of procedure: After consent was obtained patient brought to the operating room.  After adequate anesthesia he is placed in lithotomy position and prepped and draped in the usual sterile fashion.  Timeout was performed to confirm the patient and procedure.  Cystoscope was passed per urethra and the bladder inspected.  His previously noted tumor was not seen except with extreme torque on the scope due to the fixed bladder neck.  We swapped that out for the continuous-flow sheath with the visual obturator and then the loop and handle.  With extreme lateral torque and the angle of the loop I could just reach the inferior portion of the tumor and began resecting.  Then the scope bent.  The light went dark.  The scope was able to be extracted without difficulty.  Cystoscope was repassed and the urethra prostate and bladder inspected.  No injury noted.  The  long monopolar was in the room and that was passed per urethra.  This seemed to be a stiffer metal and with more scope extending from the meatus there was more purchase to push on the scope to laterally displaced it and angled toward the tumor.  Filling the bladder slightly I was just able get to the top of the tumor and resect the entire tumor which was around 2 cm.  The base was generously fulgurated.  Hemostasis was excellent.  The chips were evacuated.  The scope was backed out and a 16 [place] Foley catheter was placed and left to gravity drainage.  He was awakened taken recovery room in stable condition.  Instillation of gemcitabine  in PACU: 2000 mg of gemcitabine  was instilled per urethra in PACU.  It was left to dwell for 1 hour and then drained.  Will discharge home with Foley catheter for 3 days.  Complications: None  Blood loss: Minimal  Specimens to pathology: Right bladder neck tumor  Drains: 16 [place] Foley catheter  Disposition: Patient stable to PACU.

## 2023-09-01 NOTE — Transfer of Care (Signed)
 Immediate Anesthesia Transfer of Care Note  Patient: Jose Ware  Procedure(s) Performed: Procedure(s) with comments: TURBT, WITH CHEMOTHERAPEUTIC AGENT INSTILLATION INTO BLADDER (N/A) - 0.5-2cm  Patient Location: PACU  Anesthesia Type:General  Level of Consciousness:  sedated, patient cooperative and responds to stimulation  Airway & Oxygen Therapy:Patient Spontanous Breathing and Patient connected to face mask oxgen  Post-op Assessment:  Report given to PACU RN and Post -op Vital signs reviewed and stable  Post vital signs:  Reviewed and stable  Last Vitals:  Vitals:   09/01/23 0731 09/01/23 1017  BP: (!) 140/75 (!) 143/94  Pulse: (!) 50 (!) 43  Resp: 16 12  Temp: 36.6 C (!) 36.3 C  SpO2: 95% 97%    Complications: No apparent anesthesia complications

## 2023-09-01 NOTE — Anesthesia Procedure Notes (Signed)
 Procedure Name: Intubation Date/Time: 09/01/2023 9:28 AM  Performed by: Vincenzo Show, CRNAPre-anesthesia Checklist: Patient identified, Emergency Drugs available, Suction available, Patient being monitored and Timeout performed Patient Re-evaluated:Patient Re-evaluated prior to induction Oxygen Delivery Method: Circle system utilized Preoxygenation: Pre-oxygenation with 100% oxygen Induction Type: IV induction Ventilation: Mask ventilation without difficulty Laryngoscope Size: 4 and Glidescope Grade View: Grade I Tube type: Oral Tube size: 7.5 mm Number of attempts: 1 Airway Equipment and Method: Stylet Placement Confirmation: ETT inserted through vocal cords under direct vision, positive ETCO2, CO2 detector and breath sounds checked- equal and bilateral Secured at: 23 cm Tube secured with: Tape Dental Injury: Teeth and Oropharynx as per pre-operative assessment  Difficulty Due To: Difficulty was anticipated Comments: ATOI.

## 2023-09-01 NOTE — Anesthesia Postprocedure Evaluation (Signed)
 Anesthesia Post Note  Patient: VALTON SCHWARTZ  Procedure(s) Performed: TURBT, WITH CHEMOTHERAPEUTIC AGENT INSTILLATION INTO BLADDER (Bladder)     Patient location during evaluation: PACU Anesthesia Type: General Level of consciousness: awake and alert Pain management: pain level controlled Vital Signs Assessment: post-procedure vital signs reviewed and stable Respiratory status: spontaneous breathing, nonlabored ventilation, respiratory function stable and patient connected to nasal cannula oxygen Cardiovascular status: blood pressure returned to baseline and stable Postop Assessment: no apparent nausea or vomiting Anesthetic complications: yes   Encounter Notable Events  Notable Event Outcome Phase Comment  Difficult to intubate - expected  Intraprocedure Filed from anesthesia note documentation.    Last Vitals:  Vitals:   09/01/23 1017 09/01/23 1030  BP: (!) 143/94 116/75  Pulse: (!) 43 (!) 42  Resp: 12 17  Temp: (!) 36.3 C   SpO2: 97% 94%    Last Pain:  Vitals:   09/01/23 1030  TempSrc:   PainSc: 0-No pain                 Rosalind Guido

## 2023-09-01 NOTE — Interval H&P Note (Signed)
 History and Physical Interval Note:  09/01/2023 8:55 AM  Jose Ware  has presented today for surgery, with the diagnosis of BLADDER CANCER.  The various methods of treatment have been discussed with the patient and family. After consideration of risks, benefits and other options for treatment, the patient has consented to  Procedure(s) with comments: TURBT, WITH CHEMOTHERAPEUTIC AGENT INSTILLATION INTO BLADDER (N/A) - GEMCITABINE  2000 MG INTRAVESICAL POST OP LONG MONOPOLAR HANDLE AND LOOP as a surgical intervention.  The patient's history has been reviewed, patient examined, no change in status, stable for surgery.  I have reviewed the patient's chart and labs.  Questions were answered to the patient's satisfaction.  Discussed he may need foley catheter for discharge. He is well. No dysuria or gross hematuria. No fever. Stopped OAC.    [name]

## 2023-09-01 NOTE — Discharge Instructions (Addendum)
 Removal of the Foley catheter-remove the Foley catheter on Friday morning 09/04/2023 as instructed.

## 2023-09-01 NOTE — Anesthesia Postprocedure Evaluation (Signed)
 Anesthesia Post Note  Patient: Jose Ware  Procedure(s) Performed: TURBT, WITH CHEMOTHERAPEUTIC AGENT INSTILLATION INTO BLADDER (Bladder)     Patient location during evaluation: PACU Anesthesia Type: General Level of consciousness: awake and alert Pain management: pain level controlled Vital Signs Assessment: post-procedure vital signs reviewed and stable Respiratory status: spontaneous breathing, nonlabored ventilation, respiratory function stable and patient connected to nasal cannula oxygen Cardiovascular status: blood pressure returned to baseline and stable Postop Assessment: no apparent nausea or vomiting Anesthetic complications: yes   Encounter Notable Events  Notable Event Outcome Phase Comment  Difficult to intubate - expected  Intraprocedure Filed from anesthesia note documentation.    Last Vitals:  Vitals:   09/01/23 1017 09/01/23 1030  BP: (!) 143/94 116/75  Pulse: (!) 43 (!) 42  Resp: 12 17  Temp: (!) 36.3 C   SpO2: 97% 94%    Last Pain:  Vitals:   09/01/23 1030  TempSrc:   PainSc: 0-No pain                 Rosalind Guido

## 2023-09-02 LAB — SURGICAL PATHOLOGY

## 2023-09-03 NOTE — Progress Notes (Signed)
 09/09/2023 Jose Ware   11-11-1934  [id number]  Primary Physician Jose [name], MD Primary Cardiologist: Jose Ware  HPI:  Jose Ware is seen for follow up CAD. He has a history of HTN, remote tobacco abuse, psoriasis, prostate cancer s/p XRT and radiation seed therapy, adenomatous colon polyps and diverticulitis s/p previous abdominal surgeries.   He presented to [facility] on 11/04/15 as a inferolateral STEMI. Emergent cath revealed a very large LCx with extensive thrombus. He underwent PCI with DES. With reperfusion he had NSVT. The pt also had residual RCA and LAD disease and an EF of 25% at cath. Echo done 11/05/15 showed an EF of 40-45%. His Troponin peaked at 25. On 11/06/15 he was taken back to the lab for staged PCI to his LAD and RCA. This was successful but on 11/07/15 the pt related that he developed some word finding issues that started post PCI 8/29. MRI revealed scattered small acute infarcts in the bilateral anterior and posterior circulation.  CA dopplers revealed mild plaque (1-39%). The pt's symptoms improved.   He was admitted in September 2022 with partial SBO and cellulitis of upper arm. CT showed retroperitoneal adenopathy. PSA was normal. Repeat CT by oncology showed persistent adenopathy. Biopsy was positive for follicular lymphoma. This was felt to be low grade and is being followed closely by Jose Ware.    He was admitted 1/11-1/20/24 with partial small bowel obstruction treated medically.   He was seen in October with stable CHF symptoms. Echo repeated and was unchanged with EF 40-45%  He has been diagnosed with aggressive bladder cancer. He is now being treated with bladder infusions of immunotherapy. Opted not to have surgery. He notes legs get weak easily. No SOB, chest pain or edema.   Current Outpatient Medications  Medication Sig Dispense Refill   acetaminophen  (TYLENOL ) 500 MG tablet Take 500 mg by mouth every 6 (six) hours as needed for moderate pain  (pain score 4-6).     amLODipine  (NORVASC ) 5 MG tablet Take 1 tablet by mouth once daily 90 tablet 0   aspirin  EC 81 MG tablet Take 1 tablet (81 mg total) by mouth daily. 90 tablet 1   atorvastatin  (LIPITOR ) 10 MG tablet Take 1 tablet (10 mg total) by mouth every other day. 45 tablet 3   carvedilol  (COREG ) 3.125 MG tablet Take 1 tablet by mouth twice daily 180 tablet 2   Cyanocobalamin  (VITAMIN B-12) 5000 MCG TBDP Take 5,000 mcg by mouth every morning.     docusate sodium  (COLACE) 100 MG capsule Take 100 mg by mouth 2 (two) times daily.     furosemide  (LASIX ) 20 MG tablet TAKE 1 TABLET BY MOUTH ONCE DAILY. TAKE 1 ADDITIONAL TABLET BY MOUTH FOR A WEIGHT GAIN OF 178 OR GREATER 60 tablet 5   Multiple Vitamins-Minerals (PRESERVISION AREDS 2) CAPS Take 1 capsule by mouth 2 (two) times daily.     nitroGLYCERIN  (NITROSTAT ) 0.4 MG SL tablet Place 1 tablet (0.4 mg total) under the tongue every 5 (five) minutes x 3 doses as needed for chest pain. 25 tablet 2   spironolactone  (ALDACTONE ) 25 MG tablet Take 1 tablet by mouth once daily 90 tablet 3   No current facility-administered medications for this visit.    Allergies  Allergen Reactions   Bee Venom Swelling    Facial swelling   Betadine [Povidone Iodine] Other (See Comments)    blistering   Doxycycline      Possible drug rash- back of right  lower leg and onto left lower leg as well    Social History   Socioeconomic History   Marital status: Married    Spouse name: Not on file   Number of children: 3   Years of education: Not on file   Highest education level: Bachelor's degree (e.g., BA, AB, BS)  Occupational History   Not on file  Tobacco Use   Smoking status: Former    Current packs/day: 0.00    Average packs/day: 2.0 packs/day for 30.0 years (60.0 ttl pk-yrs)    Types: Cigarettes    Start date: 03/10/1953    Quit date: 03/11/1983    Years since quitting: 40.5   Smokeless tobacco: Never   Tobacco comments:    started smoking in the  service; ICU 61' part of his stomach; appendix; coma   Vaping Use   Vaping status: Never Used  Substance and Sexual Activity   Alcohol use: Not Currently    Comment: RARE, maybe one drink a couple times a year   Drug use: Never   Sexual activity: Not Currently  Other Topics Concern   Not on file  Social History Narrative   [employer], [place].Married 44 - Divorced 1996; remarried '03 different wife3 sons - '63, '65, '67Grandchildren -14; 1 great grand daughter; 4 great grands on wife's side, 1 great great granddaughter      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      Hobbies: fishing, busy volunteering   Social Drivers of Health   Financial Resource Strain: Low Risk  (07/19/2023)   Overall Financial Resource Strain (CARDIA)    Difficulty of Paying Living Expenses: Not very hard  Food Insecurity: No Food Insecurity (07/19/2023)   Hunger Vital Sign    Worried About Running Out of Food in the Last Year: Never true    Ran Out of Food in the Last Year: Never true  Transportation Needs: No Transportation Needs (07/19/2023)   PRAPARE - Administrator, Civil Service (Medical): No    Lack of Transportation (Non-Medical): No  Physical Activity: Sufficiently Active (07/19/2023)   Exercise Vital Sign    Days of Exercise per Week: 5 days    Minutes of Exercise per Session: 50 min  Recent Concern: Physical Activity - Insufficiently Active (06/01/2023)   Exercise Vital Sign    Days of Exercise per Week: 3 days    Minutes of Exercise per Session: 10 min  Stress: Stress Concern Present (07/19/2023)   [employer] of Occupational Health - Occupational Stress Questionnaire    Feeling of Stress : To some extent  Social Connections: Socially Integrated (07/19/2023)   Social Connection and Isolation Panel    Frequency of Communication with Friends and Family: Three times a week    Frequency of Social Gatherings with Friends and Family: Twice a week     Attends Religious Services: More than 4 times per year    Active Member of [employer] or Organizations: Yes    Attends Engineer, structural: More than 4 times per year    Marital Status: Married  Catering manager Violence: Not At Risk (06/01/2023)   Humiliation, Afraid, Rape, and Kick questionnaire    Fear of Current or Ex-Partner: No    Emotionally Abused: No    Physically Abused: No    Sexually Abused: No     Review of Systems: As noted in HPI.  All other systems reviewed and are otherwise negative except as  noted above.  Blood pressure (!) 116/54, pulse (!) 53, height 6' 1 (1.854 m), weight 195 lb (88.5 kg), SpO2 96%.  GENERAL:  Well appearing WM in NAD HEENT:  PERRL, EOMI, sclera are clear. Oropharynx is clear. NECK:  No jugular venous distention, carotid upstroke brisk and symmetric, no bruits, no thyromegaly or adenopathy LUNGS:  Clear to auscultation bilaterally CHEST:  Unremarkable HEART:  RRR,  PMI not displaced or sustained,S1 and S2 within normal limits, no S3, no S4: no clicks, no rubs, no murmurs ABD:  Soft, nontender. BS +, no masses or bruits. No hepatomegaly, no splenomegaly EXT:  2 + pulses throughout, no edema, no cyanosis no clubbing SKIN:  Warm and dry.  No rashes NEURO:  Alert and oriented x 3. Cranial nerves II through XII intact. PSYCH:  Cognitively intact   Laboratory data:  Lab Results  Component Value Date   WBC 8.4 08/26/2023   HGB 11.9 (L) 08/26/2023   HCT 37.0 (L) 08/26/2023   PLT 218 08/26/2023   GLUCOSE 108 (H) 08/26/2023   CHOL 91 07/21/2023   TRIG 53.0 07/21/2023   HDL 43.20 07/21/2023   LDLDIRECT 33.0 06/25/2020   LDLCALC 38 07/21/2023   ALT 10 07/21/2023   AST 13 07/21/2023   NA 138 08/26/2023   K 3.8 08/26/2023   CL 104 08/26/2023   CREATININE 1.11 08/26/2023   BUN 32 (H) 08/26/2023   CO2 24 08/26/2023   TSH 3.09 06/20/2019   PSA 0.00 (L) 01/09/2022   INR 1.1 12/17/2020   HGBA1C 5.7 07/21/2023    Ecg: not done today    Echo 07/29/21: IMPRESSIONS     1. Poor acoustic windows limit study Difficult to see endocardium. LVEF  is depressed, mild/mild to moderate. Lateral and inferior wall appear  hypokinetic     WOuld reocmm limited echo with Defnity to further define LVEF and wall  motion.. The left ventricular internal cavity size was mildly dilated.  There is mild left ventricular hypertrophy.   2. Right ventricular systolic function is normal. The right ventricular  size is normal.   3. Left atrial size was severely dilated.   4. The mitral valve is normal in structure. Mild mitral valve  regurgitation.   5. AV is thickened, calcified with very mildly restricted motion. Peak  and mean gradients through the valve are 21 and 11 mm Hg respectively..  The aortic valve is tricuspid. Aortic valve regurgitation is not  visualized.   6. Aortic dilatation noted. There is borderline dilatation of the  ascending aorta and of the aortic root, measuring 38 mm. There is mild  dilatation of the aortic root and of the ascending aorta, measuring 39 mm.   7. The inferior vena cava is normal in size with greater than 50%  respiratory variability, suggesting right atrial pressure of 3 mmHg.   Echo 01/15/23: IMPRESSIONS     1. Left ventricular ejection fraction, by estimation, is 40 to 45%. Left  ventricular ejection fraction by 3D volume is 45 %. The left ventricle has  mildly decreased function. The left ventricle demonstrates regional wall  motion abnormalities (see  scoring diagram/findings for description). The left ventricular internal  cavity size was moderately dilated. There is mild left ventricular  hypertrophy. Left ventricular diastolic parameters are consistent with  Grade I diastolic dysfunction (impaired  relaxation). Elevated left ventricular end-diastolic pressure. The E/e' is  16. There is severe akinesis of the left ventricular, mid-apical inferior  wall and inferoseptal wall.  2. Right  ventricular systolic function is normal. The right ventricular  size is normal. There is mildly elevated pulmonary artery systolic  pressure.   3. Left atrial size was severely dilated.   4. The mitral valve is abnormal. Mild to moderate mitral valve  regurgitation.   5. The aortic valve is tricuspid. Aortic valve regurgitation is trivial.  Mild aortic valve stenosis. Aortic valve area, by VTI measures 1.57 cm.  Aortic valve mean gradient measures 10.0 mmHg. Aortic valve Vmax measures  2.20 m/s.   6. Aortic dilatation noted. There is mild dilatation of the ascending  aorta, measuring 41 mm.   7. The inferior vena cava is normal in size with greater than 50%  respiratory variability, suggesting right atrial pressure of 3 mmHg.   Comparison(s): No significant change from prior study. 06/01/2022: LVEF  40-45%, inferior distal septal and apical hypokinesis.    ASSESSMENT AND PLAN:  1. CAD s/p inferolateral STEMI in August 2017 with thrombotic occlusion of the LCx. S/p DES. Subsequent staged PCI of the proximal LAD and RCA. He has class 1 angina but assessment somewhat limited by reduced mobility. Continue ASA. Continue low dose Coreg . 2. Ischemic LV dysfunction/ chronic systolic CHF. Class 1 symptoms.  On Coreg . lasix , aldactone . Volume status looks good. Not on ARB due to CKD.  EF mild to moderately reduced on last Echo in October.  3. 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 No residual symptoms.  4. Hypertension. Well controlled  5. Hyperlipidemia. On lipitor  10 mg every other day. LDL 38 6. Sleep apnea. CPAP per pulmonary 7. Follicular lymphoma- per oncology 8. S/p partial SBO. Felt to be related to old adhesions.  9. Invasive bladder CA. Being treated with bladder infusions of immunotherapy   PLAN   Follow up in 6 months  Bruchy Mikel [place] MD,FACC 09/09/2023 4:12 PM

## 2023-09-04 ENCOUNTER — Telehealth: Payer: Self-pay | Admitting: Gastroenterology

## 2023-09-04 NOTE — Telephone Encounter (Signed)
 Dempsey from [facility] called and stated that they are needing office visit notes as well as a form that was fax over to us  to be sent over due to patient needing C-Pap supplies. Dempsey best contact number is [phone]. Please advise.

## 2023-09-04 NOTE — Telephone Encounter (Signed)
 [facility] will need to reach out to patient's PCP for any notes in regards to CPAP supply.

## 2023-09-07 ENCOUNTER — Ambulatory Visit: Payer: Self-pay | Admitting: Cardiology

## 2023-09-08 NOTE — Telephone Encounter (Signed)
 Dorothy we do not manage his CPAP as far as I am aware-that is pulmonary Dr. Saundra office-please contact them if you are not a part of the group-I have no office notes from me with this information

## 2023-09-09 ENCOUNTER — Ambulatory Visit: Attending: Cardiology | Admitting: Cardiology

## 2023-09-09 ENCOUNTER — Encounter: Payer: Self-pay | Admitting: Cardiology

## 2023-09-09 VITALS — BP 116/54 | HR 53 | Ht 73.0 in | Wt 195.0 lb

## 2023-09-09 DIAGNOSIS — I10 Essential (primary) hypertension: Secondary | ICD-10-CM | POA: Diagnosis not present

## 2023-09-09 DIAGNOSIS — I5042 Chronic combined systolic (congestive) and diastolic (congestive) heart failure: Secondary | ICD-10-CM | POA: Diagnosis not present

## 2023-09-09 DIAGNOSIS — E785 Hyperlipidemia, unspecified: Secondary | ICD-10-CM

## 2023-09-09 DIAGNOSIS — I2510 Atherosclerotic heart disease of native coronary artery without angina pectoris: Secondary | ICD-10-CM

## 2023-09-09 NOTE — Telephone Encounter (Signed)
 I contacted [facility]. They will faxing it to [phone]. We will put it in Dr. Saundra box afterwards.

## 2023-09-09 NOTE — Patient Instructions (Signed)
 Medication Instructions:  Continue all medications *If you need a refill on your cardiac medications before your next appointment, please call your pharmacy*  Lab Work: None ordered  Testing/Procedures: None ordered  Follow-Up: At [facility], you and your health needs are our priority.  As part of our continuing mission to provide you with exceptional heart care, our providers are all part of one team.  This team includes your primary Cardiologist (physician) and Advanced Practice Providers or APPs (Physician Assistants and Nurse Practitioners) who all work together to provide you with the care you need, when you need it.  Your next appointment:  6 months    Call in Sept to schedule Jan appointment     Provider:  Dr.Jordan   We recommend signing up for the patient portal called MyChart.  Sign up information is provided on this After Visit Summary.  MyChart is used to connect with patients for Virtual Visits (Telemedicine).  Patients are able to view lab/test results, encounter notes, upcoming appointments, etc.  Non-urgent messages can be sent to your provider as well.   To learn more about what you can do with MyChart, go to [email or web address].

## 2023-09-09 NOTE — Telephone Encounter (Signed)
 Can you please contact Dempsey at [facility]- see phone number on 6/26 note in this chain. They need to send me the form they want me to fill out. Thanks

## 2023-09-15 ENCOUNTER — Telehealth: Payer: Self-pay

## 2023-09-15 NOTE — Telephone Encounter (Signed)
 CMN received from [facility] to complete setting portion.  Needs to be signed and dated.  Return with sleep studies and most recent appointment notes. Most recent OV dated on front and placed on Dr. Saundra desk in C pod.

## 2023-09-15 NOTE — Telephone Encounter (Signed)
-----   Message from Rollo FABIENE Louder sent at 09/07/2023  1:41 PM EDT ----- Please tell patient that the over read from his stress test just came back (comments on the chest imaging, not including the heart). His imaging showed a known enlarged lymph node. Please ask him to  follow up with his PCP/oncologist to be sure that this is being monitored properly   Thanks KJ  ----- Message ----- From: Interface, Rad Results In Sent: 09/04/2023   9:44 AM EDT To: Rollo JONELLE Louder, PA-C

## 2023-09-15 NOTE — Telephone Encounter (Signed)
 Called patient advised of below they verbalized understanding.

## 2023-09-16 DIAGNOSIS — R3915 Urgency of urination: Secondary | ICD-10-CM | POA: Diagnosis not present

## 2023-09-16 DIAGNOSIS — C672 Malignant neoplasm of lateral wall of bladder: Secondary | ICD-10-CM | POA: Diagnosis not present

## 2023-09-16 NOTE — Telephone Encounter (Signed)
 Received [facility] forms back with office notes and copy of sleep study report signed by Dr. Neysa. Faxed form, OV notes and sleep study to [facility] at [phone]. Received confirmation.

## 2023-09-19 ENCOUNTER — Other Ambulatory Visit: Payer: Self-pay | Admitting: Family Medicine

## 2023-10-01 DIAGNOSIS — H353124 Nonexudative age-related macular degeneration, left eye, advanced atrophic with subfoveal involvement: Secondary | ICD-10-CM | POA: Diagnosis not present

## 2023-10-01 DIAGNOSIS — H43813 Vitreous degeneration, bilateral: Secondary | ICD-10-CM | POA: Diagnosis not present

## 2023-10-01 DIAGNOSIS — H353112 Nonexudative age-related macular degeneration, right eye, intermediate dry stage: Secondary | ICD-10-CM | POA: Diagnosis not present

## 2023-10-01 DIAGNOSIS — H35423 Microcystoid degeneration of retina, bilateral: Secondary | ICD-10-CM | POA: Diagnosis not present

## 2023-10-01 DIAGNOSIS — H35372 Puckering of macula, left eye: Secondary | ICD-10-CM | POA: Diagnosis not present

## 2023-10-01 DIAGNOSIS — H35033 Hypertensive retinopathy, bilateral: Secondary | ICD-10-CM | POA: Diagnosis not present

## 2023-10-24 ENCOUNTER — Other Ambulatory Visit: Payer: Self-pay | Admitting: General Practice

## 2023-10-30 ENCOUNTER — Telehealth: Payer: Self-pay | Admitting: Hematology and Oncology

## 2023-10-30 NOTE — Telephone Encounter (Signed)
 GENE IS AWARE OF HIS RE-SCHEDULED APPOINTMENT.

## 2023-11-04 ENCOUNTER — Other Ambulatory Visit: Payer: Self-pay | Admitting: Emergency Medicine

## 2023-11-25 NOTE — Telephone Encounter (Signed)
 [facility] team- I do not see a sleep study int he chart, can we check on this? Was it done at home, in-lab or was it an ONO This is a young patient, please send further comunication to him

## 2023-11-25 NOTE — Telephone Encounter (Signed)
 I do not see sleep study results in [facility] or anywhere else in chart. Do you know if you have reviewed?

## 2023-12-19 ENCOUNTER — Other Ambulatory Visit: Payer: Self-pay | Admitting: Family Medicine

## 2023-12-21 ENCOUNTER — Ambulatory Visit: Admitting: Family Medicine

## 2023-12-21 VITALS — BP 136/72 | HR 56 | Temp 98.6°F | Ht 73.0 in | Wt 198.2 lb

## 2023-12-21 DIAGNOSIS — Z23 Encounter for immunization: Secondary | ICD-10-CM

## 2023-12-21 DIAGNOSIS — R413 Other amnesia: Secondary | ICD-10-CM

## 2023-12-21 NOTE — Progress Notes (Signed)
 Phone [phone] In person visit   Subjective:   Jose Ware is a 89 y.o. year old very pleasant male patient who presents for/with See problem oriented charting Chief Complaint  Patient presents with   Memory Loss    Looking for advice for cognition. States he can't remember things from 5 minutes ago. Non fasting. Wants Flu vaccine.     Past Medical History-  Patient Active Problem List   Diagnosis Date Noted   Chronic heart failure (HCC) 05/31/2022    Priority: High   Bigeminy 05/31/2022    Priority: High   Follicular lymphoma, unspecified, intra-abdominal lymph nodes (HCC) 04/11/2021    Priority: High   History of small bowel obstruction 11/13/2020    Priority: High   Coronary artery disease involving native coronary artery of native heart without angina pectoris     Priority: High   Ischemic cardiomyopathy     Priority: High   History of embolic stroke     Priority: High   History of primary hyperparathyroidism s/p parathyroidectomy (benign adenoma) 12/28/2013    Priority: High   Fatigue 12/01/2013    Priority: High   History of prostate cancer 12/14/2008    Priority: High   OSA on CPAP 05/08/2020    Priority: Medium    Spinal stenosis of lumbar region at multiple levels 04/30/2017    Priority: Medium    Dyslipidemia 11/16/2015    Priority: Medium    Hx of adenomatous colonic polyps 01/18/2015    Priority: Medium    B12 deficiency 09/17/2010    Priority: Medium    PSORIASIS 10/15/2007    Priority: Medium    Essential hypertension 11/27/2006    Priority: Medium    Onychomycosis 10/28/2017    Priority: Low   Post herpetic neuralgia 06/20/2017    Priority: Low   Drug reaction 02/05/2017    Priority: Low   Senile purpura 02/05/2017    Priority: Low   History of ventricular tachycardia 11/08/2015    Priority: Low   History of stomach cancer 12/01/2013    Priority: Low   Lower back pain 03/24/2013    Priority: Low   Bradycardia 09/23/2012     Priority: Low   Hereditary and idiopathic peripheral neuropathy 10/18/2007    Priority: Low   Osteoarthritis 11/27/2006    Priority: Low   NEPHROLITHIASIS, HX OF 11/27/2006    Priority: Low   Pain of back and left lower extremity 06/08/2019    Priority: 1.   Left knee pain 06/06/2019    Priority: 1.   Spondylosis 06/25/2017    Priority: 1.   Post-traumatic osteoarthritis of right wrist 04/25/2015    Priority: 1.    Medications- reviewed and updated Current Outpatient Medications  Medication Sig Dispense Refill   acetaminophen  (TYLENOL ) 500 MG tablet Take 500 mg by mouth every 6 (six) hours as needed for moderate pain (pain score 4-6).     amLODipine  (NORVASC ) 5 MG tablet Take 1 tablet by mouth once daily 90 tablet 0   aspirin  EC 81 MG tablet Take 1 tablet (81 mg total) by mouth daily. 90 tablet 1   atorvastatin  (LIPITOR ) 10 MG tablet Take 1 tablet (10 mg total) by mouth every other day. 45 tablet 3   carvedilol  (COREG ) 3.125 MG tablet Take 1 tablet by mouth twice daily 180 tablet 3   Cyanocobalamin  (VITAMIN B-12) 5000 MCG TBDP Take 5,000 mcg by mouth every morning.     docusate sodium  (COLACE) 100 MG capsule Take  100 mg by mouth 2 (two) times daily.     furosemide  (LASIX ) 20 MG tablet TAKE 1 TABLET BY MOUTH ONCE DAILY. TAKE 1 ADDITIONAL TABLET FOR A WEIGHT GAIN OF 178 OR GREATER 60 tablet 0   Multiple Vitamins-Minerals (PRESERVISION AREDS 2) CAPS Take 1 capsule by mouth 2 (two) times daily.     spironolactone  (ALDACTONE ) 25 MG tablet Take 1 tablet by mouth once daily 90 tablet 3   nitroGLYCERIN  (NITROSTAT ) 0.4 MG SL tablet Place 1 tablet (0.4 mg total) under the tongue every 5 (five) minutes x 3 doses as needed for chest pain. 25 tablet 2   No current facility-administered medications for this visit.     Objective:  BP 136/72 Comment: most recent home reading outside of today  Pulse (!) 56   Temp 98.6 F (37 C) (Oral)   Ht 6' 1 (1.854 m)   Wt 198 lb 3.2 oz (89.9 kg)   SpO2  93%   BMI 26.15 kg/m  Gen: NAD, resting comfortably CV: RRR no murmurs rubs or gallops Lungs: CTAB no crackles, wheeze, rhonchi Abdomen: soft/nontender/nondistended/normal bowel sounds. No rebound or guarding.  Ext: trace edema Skin: warm, dry Neuro: Walks with walker    Assessment and Plan   # Memory concerns/short-term memory loss S: Short-term memory loss issues noted.  Off and on Feels he cannot remember things from even 5 minutes ago. For instance couldn't remember how to get home one day before it came back to him. Simple tasks on cpu now frustrate him- used to write programs. He has done some things like looking at calendar wrong- losing track of dates. Forgets time of appointments. Repeats questions over and over.   Last November he got scammed of 20k that was atypical for him. Family and he noted around that time- daughter and her husband became concerned.  A/P: Memory loss particular short-term and MMSE 28/30 in patient with college degree concerning at least for mild cognitive impairment versus early dementia.  Symptoms have been within the last year and had CBC, CMP, B12 recently so we opted not to repeat those at this time.  Consider TSH but we opted for future labs as doubt hypothyroidism as primary cause -declines syphilis and HIV -Discussed option of neurology consult and he opted in after discussion -PHQ-2 not substantially elevated but with stress of recent bladder cancer recurrence has been rather frustrating for him-I wonder if this could play a role -Encouraged regular exercise and talked about some workaround's given all the pain he has.  Denies any hearing issues.  Encouraged healthy diet.  Encouraged brain stimulating activities  Recommended follow up: Return for next already scheduled visit or sooner if needed. Future Appointments  Date Time Provider Department Center  01/26/2024 10:00 AM Katrinka [name], MD [facility]  02/10/2024 10:00 AM [facility]  LAB [facility] None  02/10/2024 10:40 AM Federico Norleen ONEIDA MADISON, MD [facility] None  06/07/2024 10:00 AM [facility] ANNUAL WELLNESS VISIT 1 [facility] [place]    Lab/Order associations:   ICD-10-CM   1. Memory loss  R41.3 MR BRAIN WO CONTRAST    Ambulatory referral to Neurology    2. Immunization due  Z23 Flu vaccine HIGH DOSE PF(Fluzone Trivalent)      No orders of the defined types were placed in this encounter.  I personally spent a total of 35 minutes in the care of the patient today including preparing to see the patient, getting/reviewing separately obtained history, performing a medically appropriate exam/evaluation,  counseling and educating, placing orders, referring and communicating with other health care professionals, and documenting clinical information in the EHR.  Return precautions advised.  Garnette Lukes, MD

## 2023-12-21 NOTE — Patient Instructions (Addendum)
 Flu shot today  We will call you within two weeks about your referral for MRI brain through [facility].  Their phone number is [phone].  Please call them if you have not heard in 1-2 weeks  We have placed a referral for you today to [facility]- please call their # if you do not hear within a week : [phone]   Recommended follow up: Return for next already scheduled visit or sooner if needed.

## 2023-12-26 ENCOUNTER — Other Ambulatory Visit: Payer: Self-pay | Admitting: Cardiology

## 2023-12-27 ENCOUNTER — Ambulatory Visit
Admission: RE | Admit: 2023-12-27 | Discharge: 2023-12-27 | Disposition: A | Discharge status: Home / Self Care | Source: Ambulatory Visit | Attending: Family Medicine | Admitting: Family Medicine

## 2023-12-27 DIAGNOSIS — G9389 Other specified disorders of brain: Secondary | ICD-10-CM | POA: Diagnosis not present

## 2023-12-27 DIAGNOSIS — R413 Other amnesia: Secondary | ICD-10-CM

## 2023-12-28 ENCOUNTER — Encounter: Payer: Self-pay | Admitting: Physician Assistant

## 2023-12-30 ENCOUNTER — Ambulatory Visit: Payer: Self-pay | Admitting: Family Medicine

## 2024-01-12 ENCOUNTER — Other Ambulatory Visit

## 2024-01-26 ENCOUNTER — Ambulatory Visit: Admitting: Family Medicine

## 2024-01-26 ENCOUNTER — Encounter: Payer: Self-pay | Admitting: Family Medicine

## 2024-01-26 VITALS — BP 118/58 | HR 50 | Temp 98.4°F | Ht 73.0 in | Wt 196.4 lb

## 2024-01-26 DIAGNOSIS — E785 Hyperlipidemia, unspecified: Secondary | ICD-10-CM

## 2024-01-26 DIAGNOSIS — I10 Essential (primary) hypertension: Secondary | ICD-10-CM | POA: Diagnosis not present

## 2024-01-26 DIAGNOSIS — I2510 Atherosclerotic heart disease of native coronary artery without angina pectoris: Secondary | ICD-10-CM | POA: Diagnosis not present

## 2024-01-26 DIAGNOSIS — I5042 Chronic combined systolic (congestive) and diastolic (congestive) heart failure: Secondary | ICD-10-CM

## 2024-01-26 NOTE — Progress Notes (Signed)
 Phone [phone] In person visit   Subjective:   Jose Ware is a 89 y.o. year old very pleasant male patient who presents for/with See problem oriented charting Chief Complaint  Patient presents with   Hypertension    6 month follow up;    Medical Management of Chronic Issues    Past Medical History-  Patient Active Problem List   Diagnosis Date Noted   Chronic heart failure (HCC) 05/31/2022    Priority: High   Bigeminy 05/31/2022    Priority: High   Follicular lymphoma, unspecified, intra-abdominal lymph nodes (HCC) 04/11/2021    Priority: High   History of small bowel obstruction 11/13/2020    Priority: High   Coronary artery disease involving native coronary artery of native heart without angina pectoris     Priority: High   Ischemic cardiomyopathy     Priority: High   History of embolic stroke     Priority: High   History of primary hyperparathyroidism s/p parathyroidectomy (benign adenoma) 12/28/2013    Priority: High   Fatigue 12/01/2013    Priority: High   History of prostate cancer 12/14/2008    Priority: High   OSA on CPAP 05/08/2020    Priority: Medium    Spinal stenosis of lumbar region at multiple levels 04/30/2017    Priority: Medium    Dyslipidemia 11/16/2015    Priority: Medium    Hx of adenomatous colonic polyps 01/18/2015    Priority: Medium    B12 deficiency 09/17/2010    Priority: Medium    PSORIASIS 10/15/2007    Priority: Medium    Essential hypertension 11/27/2006    Priority: Medium    Onychomycosis 10/28/2017    Priority: Low   Post herpetic neuralgia 06/20/2017    Priority: Low   Drug reaction 02/05/2017    Priority: Low   Senile purpura 02/05/2017    Priority: Low   History of ventricular tachycardia 11/08/2015    Priority: Low   History of stomach cancer 12/01/2013    Priority: Low   Lower back pain 03/24/2013    Priority: Low   Bradycardia 09/23/2012    Priority: Low   Hereditary and idiopathic peripheral  neuropathy 10/18/2007    Priority: Low   Osteoarthritis 11/27/2006    Priority: Low   NEPHROLITHIASIS, HX OF 11/27/2006    Priority: Low   Pain of back and left lower extremity 06/08/2019    Priority: 1.   Left knee pain 06/06/2019    Priority: 1.   Spondylosis 06/25/2017    Priority: 1.   Post-traumatic osteoarthritis of right wrist 04/25/2015    Priority: 1.    Medications- reviewed and updated Current Outpatient Medications  Medication Sig Dispense Refill   acetaminophen  (TYLENOL ) 500 MG tablet Take 500 mg by mouth every 6 (six) hours as needed for moderate pain (pain score 4-6).     amLODipine  (NORVASC ) 5 MG tablet Take 1 tablet by mouth once daily 90 tablet 0   aspirin  EC 81 MG tablet Take 1 tablet (81 mg total) by mouth daily. 90 tablet 1   atorvastatin  (LIPITOR ) 10 MG tablet Take 1 tablet (10 mg total) by mouth every other day. 45 tablet 3   carvedilol  (COREG ) 3.125 MG tablet Take 1 tablet by mouth twice daily 180 tablet 3   Cyanocobalamin  (VITAMIN B-12) 5000 MCG TBDP Take 5,000 mcg by mouth every morning.     docusate sodium  (COLACE) 100 MG capsule Take 100 mg by mouth 2 (two) times daily.  furosemide  (LASIX ) 20 MG tablet TAKE 1 TABLET BY MOUTH ONCE DAILY. TAKE 1 ADDITIONAL TABLET FOR WEIGHT GAIN OF 178 OR GREATER. 135 tablet 2   Multiple Vitamins-Minerals (PRESERVISION AREDS 2) CAPS Take 1 capsule by mouth 2 (two) times daily.     spironolactone  (ALDACTONE ) 25 MG tablet Take 1 tablet by mouth once daily 90 tablet 3   No current facility-administered medications for this visit.     Objective:  BP (!) 118/58 (BP Location: Left Arm, Patient Position: Sitting, Cuff Size: Normal)   Pulse (!) 50   Temp 98.4 F (36.9 C) (Temporal)   Ht 6' 1 (1.854 m)   Wt 196 lb 6.4 oz (89.1 kg)   SpO2 96%   BMI 25.91 kg/m  Gen: NAD, resting comfortably CV: RRR no murmurs rubs or gallops Lungs: CTAB no crackles, wheeze, rhonchi Ext: no edema Skin: warm, dry Neuro: walking with  walker    Assessment and Plan    # Memory loss-MMSE 28 out of 30 last visit and are concerning for at least mild cognitive impairment versus early dementia.  Prior labs have been reassuring in recent memory and did not need syphilis or HIV testing.  We did refer to neurology.  We did order MRI of the brain which showed no new potential causes-older folks were noted but not new-have been noted after cardiac intervention in 2017 -upcoming neurology visit in January- reviewed MRI today  #Non hodgkins lymphoma/follicular lymphoma- diagnosed 2022- monitoring with Dr. Federico- will be seeing irene thayil in a few weeks . Prefers labs with them in a few weeks- we will likely add a1c next year  # Bladder neoplasm ultimately found to be bladder cancer- biopsy andTURBT with Dr. Nieves December 2024 and in 2025 doing BCG treatment instead of surgery-taking palliative approach instead of curative - ended up with TURBT due to recurrence and is to restart treatment with urolgoy -tolerating treatments well with ongoing BCG treatments- 2 more upcoming treatments again then will wait 6 months -some incontinence wears pads  # Congestive heart failure-hospitalization March 2024 with ejection fraction 40 to 45% and mild to moderate aortic valve stenosis #Hypertension S: medication: spironolactone  25 mg, Coreg  3.125 mg twice daily, amlodipine  5 mg, lasix  20 mg daily-with extra dose if weight goes up 3 lbs in a day or 5 lbs in 3 days- not needing extra dose BP Readings from Last 3 Encounters:  01/26/24 (!) 118/58  12/21/23 136/72  09/09/23 (!) 116/54  A/P: CHF euvolemic- no weight gain or eema- continue current medications  Hypertension well controlled continue current medications  - considered reducing amlodipine  but last blood pressure 136  #Hyperlipidemia/CAD status post PCI after STEMI August 2017 S: Patient is compliant with atorvastatin  10 mg every other day -He is also compliant with aspirin  81mg - prior  was on Plavix  as well but had easy bruising/bleeding -no chest pain or shortness of breath  Lab Results  Component Value Date   CHOL 91 07/21/2023   HDL 43.20 07/21/2023   LDLCALC 38 07/21/2023   LDLDIRECT 33.0 06/25/2020   TRIG 53.0 07/21/2023   CHOLHDL 2 07/21/2023  A/P: coronary artery disease asymptomatic continue current medications Lipids at goal- continue current medications    #OSA on CPAP- started sept 2022 follows with Dr. Shellia (Retiring- will see Almarie ferrari, NP) - he is consistent  Recommended follow up: Return in about 6 months (around 07/25/2024) for physical or sooner if needed.Schedule b4 you leave. Future Appointments  Date Time Provider Department  Center  02/10/2024 10:00 AM [facility] LAB [facility] None  02/10/2024 10:40 AM Neomi Lis T, PA-C [facility] None  03/15/2024  9:45 AM [facility] NURSE [facility] None  03/15/2024 10:00 AM Dina Camie BRAVO, PA-C [facility] None  03/23/2024  9:40 AM Jordan, Peter M, MD [facility] H&V  05/10/2024 11:00 AM Theodoro Lakes, MD [facility] [address]  06/07/2024 10:00 AM [facility] ANNUAL WELLNESS VISIT 1 [facility] [place]    Lab/Order associations:   ICD-10-CM   1. Coronary artery disease involving native coronary artery of native heart without angina pectoris  I25.10     2. Chronic combined systolic and diastolic heart failure (HCC)  P49.57     3. Essential hypertension  I10     4. Dyslipidemia  E78.5       No orders of the defined types were placed in this encounter.   Return precautions advised.  Garnette Lukes, MD

## 2024-01-26 NOTE — Patient Instructions (Addendum)
 Glad you are doing well and have upcoming visits for memory evaluation as well as lymphoma history follow up - you wanted to hold off on labs today but we will plan on these next visit  Recommended follow up: Return in about 6 months (around 07/25/2024) for physical or sooner if needed.Schedule b4 you leave.

## 2024-01-28 ENCOUNTER — Other Ambulatory Visit: Payer: Self-pay | Admitting: Medical Genetics

## 2024-02-09 ENCOUNTER — Other Ambulatory Visit: Payer: Self-pay | Admitting: Physician Assistant

## 2024-02-09 DIAGNOSIS — C8293 Follicular lymphoma, unspecified, intra-abdominal lymph nodes: Secondary | ICD-10-CM

## 2024-02-10 ENCOUNTER — Inpatient Hospital Stay: Attending: Physician Assistant

## 2024-02-10 ENCOUNTER — Inpatient Hospital Stay: Admitting: Physician Assistant

## 2024-02-10 VITALS — BP 130/60 | HR 52 | Temp 97.8°F | Resp 18 | Ht 73.0 in | Wt 198.6 lb

## 2024-02-10 DIAGNOSIS — C8293 Follicular lymphoma, unspecified, intra-abdominal lymph nodes: Secondary | ICD-10-CM | POA: Diagnosis not present

## 2024-02-10 DIAGNOSIS — C8213 Follicular lymphoma grade II, intra-abdominal lymph nodes: Secondary | ICD-10-CM | POA: Diagnosis present

## 2024-02-10 DIAGNOSIS — I700 Atherosclerosis of aorta: Secondary | ICD-10-CM | POA: Insufficient documentation

## 2024-02-10 DIAGNOSIS — Z803 Family history of malignant neoplasm of breast: Secondary | ICD-10-CM | POA: Insufficient documentation

## 2024-02-10 DIAGNOSIS — Z8551 Personal history of malignant neoplasm of bladder: Secondary | ICD-10-CM | POA: Diagnosis not present

## 2024-02-10 DIAGNOSIS — Z87891 Personal history of nicotine dependence: Secondary | ICD-10-CM | POA: Diagnosis not present

## 2024-02-10 DIAGNOSIS — Z807 Family history of other malignant neoplasms of lymphoid, hematopoietic and related tissues: Secondary | ICD-10-CM | POA: Insufficient documentation

## 2024-02-10 LAB — CBC WITH DIFFERENTIAL (CANCER CENTER ONLY)
Abs Immature Granulocytes: 0.04 K/uL (ref 0.00–0.07)
Basophils Absolute: 0 K/uL (ref 0.0–0.1)
Basophils Relative: 0 %
Eosinophils Absolute: 0.1 K/uL (ref 0.0–0.5)
Eosinophils Relative: 1 %
HCT: 37.8 % — ABNORMAL LOW (ref 39.0–52.0)
Hemoglobin: 12.5 g/dL — ABNORMAL LOW (ref 13.0–17.0)
Immature Granulocytes: 1 %
Lymphocytes Relative: 12 %
Lymphs Abs: 1 K/uL (ref 0.7–4.0)
MCH: 32.5 pg (ref 26.0–34.0)
MCHC: 33.1 g/dL (ref 30.0–36.0)
MCV: 98.2 fL (ref 80.0–100.0)
Monocytes Absolute: 1 K/uL (ref 0.1–1.0)
Monocytes Relative: 11 %
Neutro Abs: 6.3 K/uL (ref 1.7–7.7)
Neutrophils Relative %: 75 %
Platelet Count: 211 K/uL (ref 150–400)
RBC: 3.85 MIL/uL — ABNORMAL LOW (ref 4.22–5.81)
RDW: 13.5 % (ref 11.5–15.5)
WBC Count: 8.5 K/uL (ref 4.0–10.5)
nRBC: 0 % (ref 0.0–0.2)

## 2024-02-10 LAB — CMP (CANCER CENTER ONLY)
ALT: 8 U/L (ref 0–44)
AST: 16 U/L (ref 15–41)
Albumin: 3.9 g/dL (ref 3.5–5.0)
Alkaline Phosphatase: 88 U/L (ref 38–126)
Anion gap: 9 (ref 5–15)
BUN: 29 mg/dL — ABNORMAL HIGH (ref 8–23)
CO2: 31 mmol/L (ref 22–32)
Calcium: 9.4 mg/dL (ref 8.9–10.3)
Chloride: 103 mmol/L (ref 98–111)
Creatinine: 1.21 mg/dL (ref 0.61–1.24)
GFR, Estimated: 58 mL/min — ABNORMAL LOW (ref >60)
Glucose, Bld: 89 mg/dL (ref 70–99)
Potassium: 4.3 mmol/L (ref 3.5–5.1)
Sodium: 142 mmol/L (ref 135–145)
Total Bilirubin: 0.9 mg/dL (ref 0.0–1.2)
Total Protein: 6.9 g/dL (ref 6.5–8.1)

## 2024-02-10 LAB — LACTATE DEHYDROGENASE: LDH: 121 U/L (ref 105–235)

## 2024-02-10 NOTE — Progress Notes (Signed)
 [facility] [phone]   [phone]  PROGRESS NOTE  Patient Care Team: Katrinka [name], MD as PCP - General (Family Medicine) Jordan, Peter M, MD as PCP - Cardiology (Cardiology) Aleene Kitchens, MD (Inactive) (Urology) Aneita Gwendlyn DASEN, MD (Inactive) (Gastroenterology) Jordan, Peter M, MD as Consulting Physician (Cardiology) Debarah Lorrene DEL., MD as Consulting Physician (Ophthalmology) Nieves Cough, MD as Consulting Physician (Urology) Nicholaus Sherlean CROME, [facility] (Inactive) as Pharmacist (Pharmacist) Federico Norleen DASEN MADISON, MD as Consulting Physician (Hematology and Oncology)  Hematological/Oncological History # Follicular Lymphoma Stage I-II 12/04/2020: CT C/A/P shows periaortic adenopathy in the upper abdomen. No evidence of disease in the chest 12/17/2020: biopsy of lymph node shows result consistent with follicular lymphoma. 01/09/2021: transition care to Dr. Federico  07/17/2021: CT C/A/P showed slight progression of disease as demonstrated by slight interval enlargement of several retroperitoneal lymph nodes and a mildly enlarged low middle mediastinal lymph node.  Interval History:  Jose Ware 89 y.o. male with medical history significant for newly diagnosed early stage follicular lymphoma who presents for a follow up visit. The patient's last visit was on  02/11/2023. In the interim since the last visit he has had no changes in his health.   On exam today Jose Ware reports his energy and appetite are overall stable.  He restarted BCG therapy with  his urologist due to residual versus recurrent bladder cancer.  He developed some hematuria after his last BCG treatment which has improved over the last few days.  He denies nausea, vomiting or bowel habit changes.  He denies any B symptoms including fevers, night sweats or weight loss.  He denies any palpable lumps or bumps.  He has no other complaints.  Rest of the 10 point ROS is below.  MEDICAL  HISTORY:  Past Medical History:  Diagnosis Date   Adenocarcinoma of prostate [facility])    XRT & Radiation seed implantation in 2010   Adenomatous colon polyp    Allergy 1985   Anemia    Anxiety    CAD (coronary artery disease)    a. 11/04/15:  Acute inferolateral STEMI: S/p emergent DES of a very large LCx with extensive thrombus. Significant residual disease in the proximal LAD and distal RCA. S/p staged PCI of LAD and RCA 8/29.   Cataract    CHF (congestive heart failure) (HCC)    CVA (cerebral infarction)    a. 10/2015: embolic CVA after heart cath    DIVERTICULITIS, HX OF 11/27/2006        DJD (degenerative joint disease)    wrist - R    Hernia    History of blood transfusion 1985   with colon surgery   History of kidney stones    Hypertension    Ischemic cardiomyopathy    a. EF 25-30% by LV gram on cath 11/04/15. Appeared out of proportion to infarct although infarct was large. EF improved by Echo and now 40-45%.   Myocardial infarction (HCC) 2019   NEPHROLITHIASIS, HX OF 11/27/2006         Non Hodgkin's lymphoma (HCC) 11/2020   watching at this point, in abdomen, no meds or chemo yet as of 01-27-2022   Peripheral neuropathy    pt denies   Pneumonia    as a child   Psoriasis    Sleep apnea    wears cpap   Stroke (HCC)    no residuals   Tenosynovitis     SURGICAL HISTORY: Past Surgical History:  Procedure Laterality Date   APPENDECTOMY  03/11/1983   BACK SURGERY  03/10/1978   minor disk surgery: instrumentation placed and removed in a second procedure   CARDIAC CATHETERIZATION N/A 11/04/2015   Procedure: Left Heart Cath and Coronary Angiography;  Surgeon: Peter M Jordan, MD;  Location: [facility] INVASIVE CV LAB;  Service: Cardiovascular;  Laterality: N/A;   CARDIAC CATHETERIZATION N/A 11/04/2015   Procedure: Coronary Stent Intervention;  Surgeon: Peter M Jordan, MD;  Location: [facility] INVASIVE CV LAB;  Service: Cardiovascular;  Laterality: N/A;   CARDIAC CATHETERIZATION N/A  11/06/2015   Procedure: Coronary Stent Intervention;  Surgeon: Alm LELON Clay, MD;  Location: [facility] INVASIVE CV LAB;  Service: Cardiovascular;  Laterality: N/A;   CATARACT EXTRACTION, BILATERAL  03/11/2011   CHOLECYSTECTOMY  03/10/1984   COLON SURGERY  11/09/1983   pt. had ileus, had colon surgery, requiring colostomy & then reversal & then dehisence of that wound & return to OR for repair & cholecystectomy     COLONOSCOPY  2019   COLONOSCOPY WITH PROPOFOL  N/A 03/13/2022   Procedure: COLONOSCOPY WITH PROPOFOL ;  Surgeon: Aneita Gwendlyn DASEN, MD;  Location: [facility];  Service: Gastroenterology;  Laterality: N/A;   COLOSTOMY  03/11/1983   After colectomy for diverticulitis, pt. remarks during this surgery they gave me the paddles two times  because of bleeding, pt. states it was unrelated to any anesthesia complication   CYSTOSCOPY WITH BIOPSY N/A 03/06/2023   Procedure: CYSTOSCOPY WITH BIOPSY;  Surgeon: Nieves Cough, MD;  Location: [facility];  Service: Urology;  Laterality: N/A;  79 MINUTES FOR CASE   EYE SURGERY     /w IOL   KNEE ARTHROSCOPY Left 03/10/2001   KNEE SURGERY Left 03/10/1954   Left 1956, Right w/cartilage removed later   PARATHYROIDECTOMY N/A 05/12/2014   Procedure: PARATHYROIDECTOMY;  Surgeon: Krystal Spinner, MD;  Location: [facility];  Service: General;  Laterality: N/A;   POLYPECTOMY     POLYPECTOMY  03/13/2022   Procedure: POLYPECTOMY;  Surgeon: Aneita Gwendlyn DASEN, MD;  Location: [facility];  Service: Gastroenterology;;   REVERSAL OF COLOSTOMY  03/10/1985   TONSILLECTOMY  03/10/1944   TRANSURETHRAL RESECTION OF BLADDER TUMOR N/A 03/06/2023   Procedure: TRANSURETHRAL RESECTION OF BLADDER TUMOR (TURBT);  Surgeon: Nieves Cough, MD;  Location: [facility];  Service: Urology;  Laterality: N/A;  2-5 cm   WRIST SURGERY Left    Remote complicated w/infection    SOCIAL HISTORY: Social History   Socioeconomic History   Marital status: Married    Spouse name: Not on file   Number of  children: 3   Years of education: Not on file   Highest education level: Bachelor's degree (e.g., BA, AB, BS)  Occupational History   Not on file  Tobacco Use   Smoking status: Former    Current packs/day: 0.00    Average packs/day: 2.0 packs/day for 30.0 years (60.0 ttl pk-yrs)    Types: Cigarettes    Start date: 03/10/1953    Quit date: 03/11/1983    Years since quitting: 40.9   Smokeless tobacco: Never   Tobacco comments:    started smoking in the service; ICU 85' part of his stomach; appendix; coma   Vaping Use   Vaping status: Never Used  Substance and Sexual Activity   Alcohol use: Not Currently    Comment: RARE, maybe one drink a couple times a year   Drug use: Never   Sexual activity: Not Currently  Other Topics Concern   Not on file  Social History Narrative   [employer], [place].Married 6 - Divorced 02-17-1995; remarried '03 different wife3 sons - '63, '65, '67Grandchildren -14; 1 great grand daughter; 4 great grands on wife's side, 1 great great granddaughter      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      Hobbies: fishing, busy volunteering   Social Drivers of Health   Financial Resource Strain: Low Risk  (12/21/2023)   Overall Financial Resource Strain (CARDIA)    Difficulty of Paying Living Expenses: Not very hard  Food Insecurity: No Food Insecurity (12/21/2023)   Hunger Vital Sign    Worried About Running Out of Food in the Last Year: Never true    Ran Out of Food in the Last Year: Never true  Transportation Needs: No Transportation Needs (12/21/2023)   PRAPARE - Administrator, Civil Service (Medical): No    Lack of Transportation (Non-Medical): No  Physical Activity: Inactive (12/21/2023)   Exercise Vital Sign    Days of Exercise per Week: 0 days    Minutes of Exercise per Session: Not on file  Stress: Stress Concern Present (12/21/2023)   [employer] of Occupational Health - Occupational Stress  Questionnaire    Feeling of Stress: To some extent  Social Connections: Moderately Integrated (12/21/2023)   Social Connection and Isolation Panel    Frequency of Communication with Friends and Family: Once a week    Frequency of Social Gatherings with Friends and Family: Once a week    Attends Religious Services: More than 4 times per year    Active Member of [employer] or Organizations: Yes    Attends Banker Meetings: 1 to 4 times per year    Marital Status: Married  Catering Manager Violence: Not At Risk (06/01/2023)   Humiliation, Afraid, Rape, and Kick questionnaire    Fear of Current or Ex-Partner: No    Emotionally Abused: No    Physically Abused: No    Sexually Abused: No    FAMILY HISTORY: Family History  Problem Relation Age of Onset   Coronary artery disease Mother    Arthritis Mother    Alcohol abuse Father    Cirrhosis Father    Heart failure Sister    Breast cancer Sister        W/involvement of right arm leading to amputation   Cancer Sister    Anemia Sister        related to treatment to lymphoma   Non-Hodgkin's lymphoma Sister    Other Sister        died from covid 2021/02/16   Coronary artery disease Brother    Heart attack Brother    Non-Hodgkin's lymphoma Brother    Clotting disorder Brother    Non-Hodgkin's lymphoma Brother    Liver disease Neg Hx    Colon cancer Neg Hx    Esophageal cancer Neg Hx     ALLERGIES:  is allergic to bee venom, betadine [povidone iodine], and doxycycline .  MEDICATIONS:  Current Outpatient Medications  Medication Sig Dispense Refill   acetaminophen  (TYLENOL ) 500 MG tablet Take 500 mg by mouth every 6 (six) hours as needed for moderate pain (pain score 4-6).     amLODipine  (NORVASC ) 5 MG tablet Take 1 tablet by mouth once daily 90 tablet 0   aspirin  EC 81 MG tablet Take 1 tablet (81 mg total) by mouth daily. 90 tablet 1   atorvastatin  (LIPITOR ) 10 MG tablet Take 1 tablet (10  mg total) by mouth every other day. 45  tablet 3   carvedilol  (COREG ) 3.125 MG tablet Take 1 tablet by mouth twice daily 180 tablet 3   Cyanocobalamin  (VITAMIN B-12) 5000 MCG TBDP Take 5,000 mcg by mouth every morning.     docusate sodium  (COLACE) 100 MG capsule Take 100 mg by mouth 2 (two) times daily.     furosemide  (LASIX ) 20 MG tablet TAKE 1 TABLET BY MOUTH ONCE DAILY. TAKE 1 ADDITIONAL TABLET FOR WEIGHT GAIN OF 178 OR GREATER. 135 tablet 2   Multiple Vitamins-Minerals (PRESERVISION AREDS 2) CAPS Take 1 capsule by mouth 2 (two) times daily.     spironolactone  (ALDACTONE ) 25 MG tablet Take 1 tablet by mouth once daily 90 tablet 3   No current facility-administered medications for this visit.    REVIEW OF SYSTEMS:   Constitutional: ( - ) fevers, ( - )  chills , ( - ) night sweats Eyes: ( - ) blurriness of vision, ( - ) double vision, ( - ) watery eyes Ears, nose, mouth, throat, and face: ( - ) mucositis, ( - ) sore throat Respiratory: ( - ) cough, ( - ) dyspnea, ( - ) wheezes Cardiovascular: ( - ) palpitation, ( - ) chest discomfort, ( - ) lower extremity swelling Gastrointestinal:  ( - ) nausea, ( - ) heartburn, ( - ) change in bowel habits Skin: ( - ) abnormal skin rashes Lymphatics: ( - ) new lymphadenopathy, ( - ) easy bruising Neurological: ( - ) numbness, ( - ) tingling, ( - ) new weaknesses Behavioral/Psych: ( - ) mood change, ( - ) new changes  All other systems were reviewed with the patient and are negative.  PHYSICAL EXAMINATION: ECOG PERFORMANCE STATUS: 0 - Asymptomatic  Vitals:   02/10/24 1036  BP: 130/60  Pulse: (!) 52  Resp: 18  Temp: 97.8 F (36.6 C)  SpO2: 99%     Filed Weights   02/10/24 1036  Weight: 198 lb 9.6 oz (90.1 kg)    GENERAL: Well-appearing elderly Caucasian male, alert, no distress and comfortable SKIN: skin color, texture, turgor are normal, no rashes or significant lesions EYES: conjunctiva are pink and non-injected, sclera clear LUNGS: clear to auscultation and percussion  with normal breathing effort HEART: regular rate & rhythm and no murmurs and no lower extremity edema Musculoskeletal: no cyanosis of digits and no clubbing  PSYCH: alert & oriented x 3, fluent speech NEURO: no focal motor/sensory deficits  LABORATORY DATA:  I have reviewed the data as listed    Latest Ref Rng & Units 02/10/2024   10:01 AM 08/26/2023    8:33 AM 07/21/2023   10:14 AM  CBC  WBC 4.0 - 10.5 K/uL 8.5  8.4  6.6   Hemoglobin 13.0 - 17.0 g/dL 87.4  88.0  87.4   Hematocrit 39.0 - 52.0 % 37.8  37.0  37.7   Platelets 150 - 400 K/uL 211  218  212.0        Latest Ref Rng & Units 02/10/2024   10:01 AM 08/26/2023    8:33 AM 07/21/2023   10:14 AM  CMP  Glucose 70 - 99 mg/dL 89  891  88   BUN 8 - 23 mg/dL 29  32  32   Creatinine 0.61 - 1.24 mg/dL 8.78  8.88  8.83   Sodium 135 - 145 mmol/L 142  138  140   Potassium 3.5 - 5.1 mmol/L 4.3  3.8  4.0  Chloride 98 - 111 mmol/L 103  104  103   CO2 22 - 32 mmol/L 31  24  27    Calcium  8.9 - 10.3 mg/dL 9.4  8.9  9.2   Total Protein 6.5 - 8.1 g/dL 6.9   6.6   Total Bilirubin 0.0 - 1.2 mg/dL 0.9   1.0   Alkaline Phos 38 - 126 U/L 88   73   AST 15 - 41 U/L 16   13   ALT 0 - 44 U/L 8   10    RADIOGRAPHIC STUDIES: No results found.  ASSESSMENT & PLAN DAVIER TRAMELL is a 89 y.o. male with medical history significant for newly diagnosed early stage follicular lymphoma who presents for a follow up visit.   Previously we discussed the diagnosis of follicular lymphoma.  Follicular lymphoma tends to be a more indolent lymphoma and therefore treatment is not immediately required.  We discussed the criteria the patient have to meet in order to begin treatment.  This included the GELF criteria.  As the patient is currently low stage and does not meet any of these criteria I would recommend observation at this time.  The patient and his wife were in agreement with this plan moving forward.  FLIPI Score: Low Risk (Age >60). 10 year OS is 70%  #  Follicular Lymphoma Stage I-II -- Findings are consistent with a low-grade lymphoma, most likely follicular lymphoma.  At this time disease appears quite limited in stage --given the abdominal location and possible mediastinal involvement would recommend against local therapy.  -- Labs shows white blood cell count 8.5, Hgb 12.5, Plt 211, creatinine and LFTs normal. LDH normal.  --Reviewed CT scan from 09/21/2022 that showed para-aortic lymphadenopathy has increased mildly and left periaortic nodal mass is unchanged in size.  --No clear indication to start treatment at this time as patient does not meet any GELF Criteria --Plan for a return to clinic in 12 months time with CT scan as clinically indicated.   #Bladder cancer: --Diagnosed with high grade T1 poorly differentiated on TURBT in December 2024.  --Status post BCG x 6 completed in May 2025 --Repeat cystoscopy in June 2025 for residual versus recurrent disease.  --Restarted BCG treatment, last treatment earlier this week.  # Aortic Calcification -- Incidentally noted on a CT scan --No abnormalities on physical examination, no murmur heard --echocardiogram shows mild restriction.   No orders of the defined types were placed in this encounter.  All questions were answered. The patient knows to call the clinic with any problems, questions or concerns.   I have spent a total of 30 minutes minutes of face-to-face and non-face-to-face time, preparing to see the patient, performing a medically appropriate examination, counseling and educating the patient,  documenting clinical information in the electronic health record, independently interpreting results and communicating results to the patient, and care coordination.   Johnston Police PA-C Dept of Hematology and Oncology [facility] Phone: [phone]   02/10/2024 10:57 AM

## 2024-02-11 ENCOUNTER — Other Ambulatory Visit: Payer: Medicare Other

## 2024-02-11 ENCOUNTER — Ambulatory Visit: Payer: Medicare Other | Admitting: Hematology and Oncology

## 2024-03-04 NOTE — Progress Notes (Addendum)
 "      Memory Impairment, concern for Alzheimer's disease  Jose Ware is a very pleasant 89 y.o. year old RH male with a history of hypertension, hyperlipidemia, history of STEMI August 2017, history of acute embolic stroke August 2017, mild ET, CHF, CAD, bradycardia, history of bladder cancer seen today for evaluation of memory loss.  Although etiology is unclear, recent MRI of the brain personally reviewed is remarkable for disproportionate mesial temporal atrophy, suspicious for neurodegenerative disease such as Alzheimer's pathology.  Despite these findings, MoCA today is 25/30. Patient is able to participate on ADLs and to drive without difficulties. Mood is fair.  Discussed initiating antidementia medication such as memantine, but he prefers to make sure that this medication would not affect his cardiac and other chronic medical issues, wants to discuss that with the pertinent physicians before starting the medicine.  As for neurocognitive testing, he declines the evaluation for now.  Will consider start memantine 5 mg daily as directed if cleared by the specialties, side effects discussed (history of bradycardia)  Recommend to check TSH, patient prefers to have that done at his PCPs office Recommend good control of cardiovascular risk factors.   Continue to control mood as per PCP, recommend psychotherapy for the management of mood changes including potential situational depression Follow up in 3 months.  Discussed the use of AI scribe software for clinical note transcription with the patient, who gave verbal consent to proceed.  History of Present Illness Jose Ware Gene is an 89 year old male who presents for evaluation of memory loss. He is accompanied by his wife.  Over the past 12 months, he has experienced memory changes primarily affecting short-term memory. He struggles to recall new information, recent conversations, and names within five minutes, although he says  that I had never been very good with names , causing frustration, especially during activities like computer programming, which he can do without effort prior. He sometimes loses track of the day and relies on his phone for appointments and tasks. His wife notes he repeats stories and has become more irritable when he cannot remember something immediately.  His wife notes he has become more serious and less joyful. He has been less socially active since the COVID-19 pandemic, affecting his mood and social interactions.  He has mild sleep apnea diagnosed a few years ago. He experiences daytime somnolence, frequent nighttime urination, and restlessness during sleep. He does not take any medication for sleep and has not tried melatonin.  No significant changes in appetite and denies choking on food. He does not cook often due to difficulty standing for long periods.   He has a history of cataract surgery and has deteriorated vision in his left eye.  He experiences chronic pain due to arthritis in his hands, left knee, and lower back, and uses a walker and cane for mobility. No new stroke symptoms or recent head injuries.  Patient is in charge of the medications, wife is in charge of the finances, after falling for  $20,000 scam recently.   Denies any family history of dementia.  Patient continues to drive with caution, after 1 episode in which he could not remember how to get home.     MRI of the brain 12/30/2023, personally reviewed, remarkable for disproportionate mesial temporal lobe atrophy, expected evolution of the embolic infarcts in 2017, overall mild for age chronic ischemic disease, no acute intracranial abnormalities  Allergies[1]  Current Outpatient Medications  Medication Instructions  acetaminophen  (TYLENOL ) 500 mg, Every 6 hours PRN   amLODipine  (NORVASC ) 5 mg, Oral, Daily   aspirin  EC 81 mg, Oral, Daily   atorvastatin  (LIPITOR ) 10 mg, Oral, Every other day   carvedilol   (COREG ) 3.125 mg, Oral, 2 times daily   docusate sodium  (COLACE) 100 mg, 2 times daily   furosemide  (LASIX ) 20 MG tablet TAKE 1 TABLET BY MOUTH ONCE DAILY. TAKE 1 ADDITIONAL TABLET FOR WEIGHT GAIN OF 178 OR GREATER.   Multiple Vitamins-Minerals (PRESERVISION AREDS 2) CAPS 1 capsule, 2 times daily   spironolactone  (ALDACTONE ) 25 mg, Oral, Daily   Vitamin B-12 5,000 mcg, Every morning     VITALS:  There were no vitals filed for this visit.   Neurological Exam     03/15/2024   12:00 PM  Montreal Cognitive Assessment   Visuospatial/ Executive (0/5) 4  Naming (0/3) 3  Attention: Read list of digits (0/2) 2  Attention: Read list of letters (0/1) 1  Attention: Serial 7 subtraction starting at 100 (0/3) 3  Language: Repeat phrase (0/2) 2  Language : Fluency (0/1) 1  Abstraction (0/2) 0  Delayed Recall (0/5) 3  Orientation (0/6) 6  Total 25  Adjusted Score (based on education) 25       12/21/2023    4:19 PM 05/07/2017    9:11 AM  MMSE - Mini Mental State Exam  Not completed:  --  Orientation to time 5   Orientation to Place 5   Registration 3   Attention/ Calculation 5   Recall 1   Language- name 2 objects 2   Language- repeat 1   Language- follow 3 step command 3   Language- read & follow direction 1   Write a sentence 1   Copy design 1   Total score 28        Orientation:  Alert and oriented to person, place and to time. No aphasia or dysarthria. Fund of knowledge is appropriate. Recent and remote memory impaired.  Attention and concentration are normal.  Able to name objects and repeat phrases.  Delayed recall  3/5 .  Cranial nerves: There is good facial symmetry. Extraocular muscles are intact and visual fields are full to confrontational testing. Speech is fluent and clear. No tongue deviation. Hearing is mildly decreased to conversational tone.  Tone: Tone is good throughout. No cogwheeling Sensation: Sensation is intact to light touch.  Vibration is intact at the  bilateral big toe.  Coordination: The patient has no difficulty with RAM's or FNF bilaterally. Normal finger to nose  Motor: Strength is 5/5 in the bilateral upper and lower extremities. There is no pronator drift. There are no fasciculations noted. Abnormal movements: minimal L intention tremor, no rest tremor. No asterixis DTR's: Deep tendon reflexes are 1/4 bilaterally. Gait and Station: The patient is able to ambulate without difficulty. Gait is cautious and narrow. Stride length is normal.        Thank you for allowing us  the opportunity to participate in the care of this nice patient. Please do not hesitate to contact us  for any questions or concerns.   Total time spent on today's visit was 60 minutes dedicated to this patient today, preparing to see patient, examining the patient, ordering tests and/or medications and counseling the patient, documenting clinical information in the EHR or other health record, independently interpreting results and communicating results to the patient/family, discussing treatment and goals, answering patient's questions and coordinating care.  Cc:  Katrinka [name], MD  Camie Sevin 03/15/2024 12:22 PM       [1]  Allergies Allergen Reactions   Bee Venom Swelling    Facial swelling   Betadine [Povidone Iodine] Other (See Comments)    blistering   Doxycycline      Possible drug rash- back of right lower leg and onto left lower leg as well   "

## 2024-03-15 ENCOUNTER — Ambulatory Visit

## 2024-03-15 ENCOUNTER — Encounter: Payer: Self-pay | Admitting: Physician Assistant

## 2024-03-15 ENCOUNTER — Ambulatory Visit: Admitting: Physician Assistant

## 2024-03-15 DIAGNOSIS — R413 Other amnesia: Secondary | ICD-10-CM

## 2024-03-15 NOTE — Patient Instructions (Signed)
 Follow up in 3 months

## 2024-03-18 NOTE — Progress Notes (Unsigned)
 "   03/18/2024 WASH NIENHAUS   1935-02-10  [id number]  Primary Physician Katrinka [name], MD Primary Cardiologist: Dr Kinya Meine  HPI:  Mr. Gallina is seen for follow up CAD. He has a history of HTN, remote tobacco abuse, psoriasis, prostate cancer s/p XRT and radiation seed therapy, adenomatous colon polyps and diverticulitis s/p previous abdominal surgeries.   He presented to [facility] on 11/04/15 as a inferolateral STEMI. Emergent cath revealed a very large LCx with extensive thrombus. He underwent PCI with DES. With reperfusion he had NSVT. The pt also had residual RCA and LAD disease and an EF of 25% at cath. Echo done 11/05/15 showed an EF of 40-45%. His Troponin peaked at 25. On 11/06/15 he was taken back to the lab for staged PCI to his LAD and RCA. This was successful but on 11/07/15 the pt related that he developed some word finding issues that started post PCI 8/29. MRI revealed scattered small acute infarcts in the bilateral anterior and posterior circulation.  CA dopplers revealed mild plaque (1-39%). The pt's symptoms improved.   He was admitted in September 2022 with partial SBO and cellulitis of upper arm. CT showed retroperitoneal adenopathy. PSA was normal. Repeat CT by oncology showed persistent adenopathy. Biopsy was positive for follicular lymphoma. This was felt to be low grade and is being followed closely by Dr Federico.    He was admitted 1/11-1/20/24 with partial small bowel obstruction treated medically.   He was seen in October with stable CHF symptoms. Echo repeated and was unchanged with EF 40-45%  He has been diagnosed with aggressive bladder cancer. He is now being treated with bladder infusions of immunotherapy. Opted not to have surgery. He notes legs get weak easily. No SOB, chest pain or edema.   Current Outpatient Medications  Medication Sig Dispense Refill   acetaminophen  (TYLENOL ) 500 MG tablet Take 500 mg by mouth every 6 (six) hours as needed for moderate pain  (pain score 4-6).     amLODipine  (NORVASC ) 5 MG tablet Take 1 tablet by mouth once daily 90 tablet 0   aspirin  EC 81 MG tablet Take 1 tablet (81 mg total) by mouth daily. 90 tablet 1   atorvastatin  (LIPITOR ) 10 MG tablet Take 1 tablet (10 mg total) by mouth every other day. 45 tablet 3   carvedilol  (COREG ) 3.125 MG tablet Take 1 tablet by mouth twice daily 180 tablet 3   Cyanocobalamin  (VITAMIN B-12) 5000 MCG TBDP Take 5,000 mcg by mouth every morning.     docusate sodium  (COLACE) 100 MG capsule Take 100 mg by mouth 2 (two) times daily.     furosemide  (LASIX ) 20 MG tablet TAKE 1 TABLET BY MOUTH ONCE DAILY. TAKE 1 ADDITIONAL TABLET FOR WEIGHT GAIN OF 178 OR GREATER. 135 tablet 2   Multiple Vitamins-Minerals (PRESERVISION AREDS 2) CAPS Take 1 capsule by mouth 2 (two) times daily.     spironolactone  (ALDACTONE ) 25 MG tablet Take 1 tablet by mouth once daily 90 tablet 3   No current facility-administered medications for this visit.    Allergies  Allergen Reactions   Bee Venom Swelling    Facial swelling   Betadine [Povidone Iodine] Other (See Comments)    blistering   Doxycycline      Possible drug rash- back of right lower leg and onto left lower leg as well    Social History   Socioeconomic History   Marital status: Married    Spouse name: Not on file   Number  of children: 3   Years of education: Not on file   Highest education level: Bachelor's degree (e.g., BA, AB, BS)  Occupational History   Not on file  Tobacco Use   Smoking status: Former    Current packs/day: 0.00    Average packs/day: 2.0 packs/day for 30.0 years (60.0 ttl pk-yrs)    Types: Cigarettes    Start date: 03/10/1953    Quit date: 03/11/1983    Years since quitting: 41.0   Smokeless tobacco: Never   Tobacco comments:    started smoking in the service; ICU 86' part of his stomach; appendix; coma   Vaping Use   Vaping status: Never Used  Substance and Sexual Activity   Alcohol use: Not Currently    Comment:  RARE, maybe one drink a couple times a year   Drug use: Never   Sexual activity: Not Currently  Other Topics Concern   Not on file  Social History Narrative   [employer], [place].Married 72 - Divorced 1996; remarried '03 different wife3 sons - '63, '65, '67Grandchildren -14; 1 great grand daughter; 4 great grands on wife's side, 1 great great granddaughter      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      Hobbies: fishing, busy volunteering   Right handed    Social Drivers of Health   Tobacco Use: Medium Risk (03/15/2024)   Patient History    Smoking Tobacco Use: Former    Smokeless Tobacco Use: Never    Passive Exposure: Not on Actuary Strain: Low Risk (12/21/2023)   Overall Financial Resource Strain (CARDIA)    Difficulty of Paying Living Expenses: Not very hard  Food Insecurity: No Food Insecurity (12/21/2023)   Epic    Worried About Programme Researcher, Broadcasting/film/video in the Last Year: Never true    Ran Out of Food in the Last Year: Never true  Transportation Needs: No Transportation Needs (12/21/2023)   Epic    Lack of Transportation (Medical): No    Lack of Transportation (Non-Medical): No  Physical Activity: Inactive (12/21/2023)   Exercise Vital Sign    Days of Exercise per Week: 0 days    Minutes of Exercise per Session: Not on file  Stress: Stress Concern Present (12/21/2023)   [employer] of Occupational Health - Occupational Stress Questionnaire    Feeling of Stress: To some extent  Social Connections: Moderately Integrated (12/21/2023)   Social Connection and Isolation Panel    Frequency of Communication with Friends and Family: Once a week    Frequency of Social Gatherings with Friends and Family: Once a week    Attends Religious Services: More than 4 times per year    Active Member of Clubs or Organizations: Yes    Attends Banker Meetings: 1 to 4 times per year    Marital Status: Married  Careers Information Officer Violence: Not At Risk (06/01/2023)   Humiliation, Afraid, Rape, and Kick questionnaire    Fear of Current or Ex-Partner: No    Emotionally Abused: No    Physically Abused: No    Sexually Abused: No  Depression (PHQ2-9): Low Risk (01/26/2024)   Depression (PHQ2-9)    PHQ-2 Score: 3  Alcohol Screen: Low Risk (12/21/2023)   Alcohol Screen    Last Alcohol Screening Score (AUDIT): 1  Housing: Low Risk (12/21/2023)   Epic    Unable to Pay for Housing in the Last Year: No    Number  of Times Moved in the Last Year: 0    Homeless in the Last Year: No  Utilities: Not At Risk (06/01/2023)   AHC Utilities    Threatened with loss of utilities: No  Health Literacy: Adequate Health Literacy (06/01/2023)   B1300 Health Literacy    Frequency of need for help with medical instructions: Never     Review of Systems: As noted in HPI.  All other systems reviewed and are otherwise negative except as noted above.  There were no vitals taken for this visit.  GENERAL:  Well appearing WM in NAD HEENT:  PERRL, EOMI, sclera are clear. Oropharynx is clear. NECK:  No jugular venous distention, carotid upstroke brisk and symmetric, no bruits, no thyromegaly or adenopathy LUNGS:  Clear to auscultation bilaterally CHEST:  Unremarkable HEART:  RRR,  PMI not displaced or sustained,S1 and S2 within normal limits, no S3, no S4: no clicks, no rubs, no murmurs ABD:  Soft, nontender. BS +, no masses or bruits. No hepatomegaly, no splenomegaly EXT:  2 + pulses throughout, no edema, no cyanosis no clubbing SKIN:  Warm and dry.  No rashes NEURO:  Alert and oriented x 3. Cranial nerves II through XII intact. PSYCH:  Cognitively intact   Laboratory data:  Lab Results  Component Value Date   WBC 8.5 02/10/2024   HGB 12.5 (L) 02/10/2024   HCT 37.8 (L) 02/10/2024   PLT 211 02/10/2024   GLUCOSE 89 02/10/2024   CHOL 91 07/21/2023   TRIG 53.0 07/21/2023   HDL 43.20 07/21/2023   LDLDIRECT 33.0 06/25/2020    LDLCALC 38 07/21/2023   ALT 8 02/10/2024   AST 16 02/10/2024   NA 142 02/10/2024   K 4.3 02/10/2024   CL 103 02/10/2024   CREATININE 1.21 02/10/2024   BUN 29 (H) 02/10/2024   CO2 31 02/10/2024   TSH 3.09 06/20/2019   PSA 0.00 (L) 01/09/2022   INR 1.1 12/17/2020   HGBA1C 5.7 07/21/2023    Ecg: not done today   Echo 07/29/21: IMPRESSIONS     1. Poor acoustic windows limit study Difficult to see endocardium. LVEF  is depressed, mild/mild to moderate. Lateral and inferior wall appear  hypokinetic     WOuld reocmm limited echo with Defnity to further define LVEF and wall  motion.. The left ventricular internal cavity size was mildly dilated.  There is mild left ventricular hypertrophy.   2. Right ventricular systolic function is normal. The right ventricular  size is normal.   3. Left atrial size was severely dilated.   4. The mitral valve is normal in structure. Mild mitral valve  regurgitation.   5. AV is thickened, calcified with very mildly restricted motion. Peak  and mean gradients through the valve are 21 and 11 mm Hg respectively..  The aortic valve is tricuspid. Aortic valve regurgitation is not  visualized.   6. Aortic dilatation noted. There is borderline dilatation of the  ascending aorta and of the aortic root, measuring 38 mm. There is mild  dilatation of the aortic root and of the ascending aorta, measuring 39 mm.   7. The inferior vena cava is normal in size with greater than 50%  respiratory variability, suggesting right atrial pressure of 3 mmHg.   Echo 01/15/23: IMPRESSIONS     1. Left ventricular ejection fraction, by estimation, is 40 to 45%. Left  ventricular ejection fraction by 3D volume is 45 %. The left ventricle has  mildly decreased function. The left ventricle demonstrates  regional wall  motion abnormalities (see  scoring diagram/findings for description). The left ventricular internal  cavity size was moderately dilated. There is mild left  ventricular  hypertrophy. Left ventricular diastolic parameters are consistent with  Grade I diastolic dysfunction (impaired  relaxation). Elevated left ventricular end-diastolic pressure. The E/e' is  16. There is severe akinesis of the left ventricular, mid-apical inferior  wall and inferoseptal wall.   2. Right ventricular systolic function is normal. The right ventricular  size is normal. There is mildly elevated pulmonary artery systolic  pressure.   3. Left atrial size was severely dilated.   4. The mitral valve is abnormal. Mild to moderate mitral valve  regurgitation.   5. The aortic valve is tricuspid. Aortic valve regurgitation is trivial.  Mild aortic valve stenosis. Aortic valve area, by VTI measures 1.57 cm.  Aortic valve mean gradient measures 10.0 mmHg. Aortic valve Vmax measures  2.20 m/s.   6. Aortic dilatation noted. There is mild dilatation of the ascending  aorta, measuring 41 mm.   7. The inferior vena cava is normal in size with greater than 50%  respiratory variability, suggesting right atrial pressure of 3 mmHg.   Comparison(s): No significant change from prior study. 06/01/2022: LVEF  40-45%, inferior distal septal and apical hypokinesis.    ASSESSMENT AND PLAN:  1. CAD s/p inferolateral STEMI in August 2017 with thrombotic occlusion of the LCx. S/p DES. Subsequent staged PCI of the proximal LAD and RCA. He has class 1 angina but assessment somewhat limited by reduced mobility. Continue ASA. Continue low dose Coreg . 2. Ischemic LV dysfunction/ chronic systolic CHF. Class 1 symptoms.  On Coreg . lasix , aldactone . Volume status looks good. Not on ARB due to CKD.  EF mild to moderately reduced on last Echo in October.  3. 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 No residual symptoms.  4. Hypertension. Well controlled  5. Hyperlipidemia. On lipitor  10 mg every other day. LDL 38 6. Sleep apnea. CPAP per pulmonary 7. Follicular lymphoma- per oncology 8. S/p  partial SBO. Felt to be related to old adhesions.  9. Invasive bladder CA. Being treated with bladder infusions of immunotherapy   PLAN   Follow up in 6 months  Renae Mottley MD,FACC 03/18/2024 10:04 AM "

## 2024-03-19 ENCOUNTER — Other Ambulatory Visit: Payer: Self-pay | Admitting: Family Medicine

## 2024-03-23 ENCOUNTER — Encounter: Payer: Self-pay | Admitting: Cardiology

## 2024-03-23 ENCOUNTER — Ambulatory Visit: Admitting: Cardiology

## 2024-03-23 VITALS — BP 110/68 | HR 60 | Ht 73.0 in | Wt 196.6 lb

## 2024-03-23 DIAGNOSIS — I5042 Chronic combined systolic (congestive) and diastolic (congestive) heart failure: Secondary | ICD-10-CM | POA: Diagnosis not present

## 2024-03-23 DIAGNOSIS — I493 Ventricular premature depolarization: Secondary | ICD-10-CM

## 2024-03-23 DIAGNOSIS — I10 Essential (primary) hypertension: Secondary | ICD-10-CM

## 2024-03-23 DIAGNOSIS — I2510 Atherosclerotic heart disease of native coronary artery without angina pectoris: Secondary | ICD-10-CM

## 2024-03-23 DIAGNOSIS — E785 Hyperlipidemia, unspecified: Secondary | ICD-10-CM

## 2024-03-23 NOTE — Patient Instructions (Signed)
 Medication Instructions:  Continue same medications *If you need a refill on your cardiac medications before your next appointment, please call your pharmacy*  Lab Work: None ordered  Testing/Procedures: None ordered  Follow-Up: At [name], you and your health needs are our priority.  As part of our continuing mission to provide you with exceptional heart care, our providers are all part of one team.  This team includes your primary Cardiologist (physician) and Advanced Practice Providers or APPs (Physician Assistants and Nurse Practitioners) who all work together to provide you with the care you need, when you need it.  Your next appointment:  6 months     Call in April to schedule July appointment     Provider:  Dr.Jordan   We recommend signing up for the patient portal called MyChart.  Sign up information is provided on this After Visit Summary.  MyChart is used to connect with patients for Virtual Visits (Telemedicine).  Patients are able to view lab/test results, encounter notes, upcoming appointments, etc.  Non-urgent messages can be sent to your provider as well.   To learn more about what you can do with MyChart, go to [email or web address].

## 2024-04-08 ENCOUNTER — Encounter: Payer: Self-pay | Admitting: Cardiology

## 2024-04-08 ENCOUNTER — Encounter: Payer: Self-pay | Admitting: Family Medicine

## 2024-05-10 ENCOUNTER — Encounter: Admitting: Pulmonary Disease

## 2024-05-10 ENCOUNTER — Encounter

## 2024-06-07 ENCOUNTER — Ambulatory Visit

## 2024-06-14 ENCOUNTER — Ambulatory Visit: Payer: Self-pay | Admitting: Physician Assistant

## 2024-08-02 ENCOUNTER — Encounter: Admitting: Family Medicine

## 2025-02-08 ENCOUNTER — Inpatient Hospital Stay

## 2025-02-08 ENCOUNTER — Inpatient Hospital Stay: Admitting: Hematology and Oncology
